# Patient Record
Sex: Female | Born: 1960
Health system: Southern US, Community
[De-identification: ages and names within clinical notes are randomized; demographics above are authoritative.]

## PROBLEM LIST (undated history)

## (undated) DIAGNOSIS — F419 Anxiety disorder, unspecified: Secondary | ICD-10-CM

## (undated) DIAGNOSIS — F329 Major depressive disorder, single episode, unspecified: Secondary | ICD-10-CM

## (undated) DIAGNOSIS — I2510 Atherosclerotic heart disease of native coronary artery without angina pectoris: Secondary | ICD-10-CM

## (undated) DIAGNOSIS — F32A Depression, unspecified: Secondary | ICD-10-CM

## (undated) DIAGNOSIS — E785 Hyperlipidemia, unspecified: Secondary | ICD-10-CM

## (undated) DIAGNOSIS — T7840XA Allergy, unspecified, initial encounter: Secondary | ICD-10-CM

## (undated) DIAGNOSIS — E039 Hypothyroidism, unspecified: Secondary | ICD-10-CM

## (undated) DIAGNOSIS — E079 Disorder of thyroid, unspecified: Secondary | ICD-10-CM

## (undated) DIAGNOSIS — K219 Gastro-esophageal reflux disease without esophagitis: Secondary | ICD-10-CM

## (undated) HISTORY — DX: Allergy, unspecified, initial encounter: T78.40XA

## (undated) HISTORY — DX: Hyperlipidemia, unspecified: E78.5

## (undated) HISTORY — DX: Atherosclerotic heart disease of native coronary artery without angina pectoris: I25.10

## (undated) HISTORY — DX: Major depressive disorder, single episode, unspecified: F32.9

## (undated) HISTORY — DX: Anxiety disorder, unspecified: F41.9

## (undated) HISTORY — DX: Depression, unspecified: F32.A

## (undated) HISTORY — DX: Gastro-esophageal reflux disease without esophagitis: K21.9

## (undated) HISTORY — PX: TONSILLECTOMY: SUR1361

---

## 1998-07-20 ENCOUNTER — Other Ambulatory Visit: Admission: RE | Admit: 1998-07-20 | Discharge: 1998-07-20 | Payer: Self-pay | Admitting: Obstetrics & Gynecology

## 1999-09-06 ENCOUNTER — Other Ambulatory Visit: Admission: RE | Admit: 1999-09-06 | Discharge: 1999-09-06 | Payer: Self-pay | Admitting: Obstetrics & Gynecology

## 2000-10-07 ENCOUNTER — Other Ambulatory Visit: Admission: RE | Admit: 2000-10-07 | Discharge: 2000-10-07 | Payer: Self-pay | Admitting: Obstetrics & Gynecology

## 2001-10-07 ENCOUNTER — Encounter: Admission: RE | Admit: 2001-10-07 | Discharge: 2002-01-05 | Payer: Self-pay | Admitting: Family Medicine

## 2001-10-08 ENCOUNTER — Other Ambulatory Visit: Admission: RE | Admit: 2001-10-08 | Discharge: 2001-10-08 | Payer: Self-pay | Admitting: Obstetrics & Gynecology

## 2003-02-06 ENCOUNTER — Other Ambulatory Visit: Admission: RE | Admit: 2003-02-06 | Discharge: 2003-02-06 | Payer: Self-pay | Admitting: Obstetrics & Gynecology

## 2003-09-22 ENCOUNTER — Ambulatory Visit (HOSPITAL_COMMUNITY): Admission: RE | Admit: 2003-09-22 | Discharge: 2003-09-22 | Payer: Self-pay | Admitting: Obstetrics & Gynecology

## 2004-03-14 ENCOUNTER — Other Ambulatory Visit: Admission: RE | Admit: 2004-03-14 | Discharge: 2004-03-14 | Payer: Self-pay | Admitting: Obstetrics & Gynecology

## 2004-06-10 ENCOUNTER — Observation Stay (HOSPITAL_COMMUNITY): Admission: EM | Admit: 2004-06-10 | Discharge: 2004-06-12 | Payer: Self-pay | Admitting: *Deleted

## 2005-01-03 ENCOUNTER — Encounter: Admission: RE | Admit: 2005-01-03 | Discharge: 2005-01-03 | Payer: Self-pay | Admitting: Family Medicine

## 2005-03-18 ENCOUNTER — Other Ambulatory Visit: Admission: RE | Admit: 2005-03-18 | Discharge: 2005-03-18 | Payer: Self-pay | Admitting: Obstetrics and Gynecology

## 2005-03-29 ENCOUNTER — Encounter: Admission: RE | Admit: 2005-03-29 | Discharge: 2005-03-29 | Payer: Self-pay | Admitting: Family Medicine

## 2009-02-27 ENCOUNTER — Encounter: Payer: Self-pay | Admitting: Sports Medicine

## 2009-03-15 ENCOUNTER — Encounter: Payer: Self-pay | Admitting: Sports Medicine

## 2009-03-15 ENCOUNTER — Ambulatory Visit: Payer: Self-pay

## 2009-03-15 DIAGNOSIS — M216X9 Other acquired deformities of unspecified foot: Secondary | ICD-10-CM | POA: Insufficient documentation

## 2009-03-15 DIAGNOSIS — E039 Hypothyroidism, unspecified: Secondary | ICD-10-CM | POA: Insufficient documentation

## 2009-03-15 DIAGNOSIS — M722 Plantar fascial fibromatosis: Secondary | ICD-10-CM | POA: Insufficient documentation

## 2009-06-16 ENCOUNTER — Emergency Department (HOSPITAL_COMMUNITY): Admission: EM | Admit: 2009-06-16 | Discharge: 2009-06-16 | Payer: Self-pay | Admitting: Emergency Medicine

## 2010-06-27 ENCOUNTER — Encounter: Payer: Self-pay | Admitting: *Deleted

## 2010-07-08 LAB — DIFFERENTIAL
Basophils Absolute: 0.1 10*3/uL (ref 0.0–0.1)
Basophils Relative: 1 % (ref 0–1)
Eosinophils Absolute: 0 10*3/uL (ref 0.0–0.7)
Eosinophils Relative: 1 % (ref 0–5)
Lymphocytes Relative: 19 % (ref 12–46)
Lymphs Abs: 1.6 10*3/uL (ref 0.7–4.0)
Monocytes Absolute: 0.6 10*3/uL (ref 0.1–1.0)
Monocytes Relative: 7 % (ref 3–12)
Neutro Abs: 6.2 10*3/uL (ref 1.7–7.7)
Neutrophils Relative %: 73 % (ref 43–77)

## 2010-07-08 LAB — POCT CARDIAC MARKERS
CKMB, poc: <1 ng/mL — ABNORMAL LOW (ref 1.0–8.0)
CKMB, poc: <1 ng/mL — ABNORMAL LOW (ref 1.0–8.0)
Myoglobin, poc: 31 ng/mL (ref 12–200)
Myoglobin, poc: 33.4 ng/mL (ref 12–200)
Troponin i, poc: <0.05 ng/mL (ref 0.00–0.09)
Troponin i, poc: <0.05 ng/mL (ref 0.00–0.09)

## 2010-07-08 LAB — BASIC METABOLIC PANEL
BUN: 9 mg/dL (ref 6–23)
CO2: 23 mEq/L (ref 19–32)
Calcium: 9.1 mg/dL (ref 8.4–10.5)
Chloride: 107 mEq/L (ref 96–112)
Creatinine, Ser: 0.82 mg/dL (ref 0.4–1.2)
GFR calc Af Amer: >60 mL/min (ref >60)
GFR calc non Af Amer: >60 mL/min (ref >60)
Glucose, Bld: 115 mg/dL — ABNORMAL HIGH (ref 70–99)
Potassium: 3.8 mEq/L (ref 3.5–5.1)
Sodium: 139 mEq/L (ref 135–145)

## 2010-07-08 LAB — D-DIMER, QUANTITATIVE: D-Dimer, Quant: 0.32 ug/mL-FEU (ref 0.00–0.48)

## 2010-07-08 LAB — CBC
HCT: 42.4 % (ref 36.0–46.0)
Hemoglobin: 13.6 g/dL (ref 12.0–15.0)
MCHC: 32 g/dL (ref 30.0–36.0)
MCV: 92.3 fL (ref 78.0–100.0)
Platelets: 284 10*3/uL (ref 150–400)
RBC: 4.59 MIL/uL (ref 3.87–5.11)
RDW: 12.8 % (ref 11.5–15.5)
WBC: 8.4 10*3/uL (ref 4.0–10.5)

## 2010-07-08 LAB — TSH: TSH: 6.35 u[IU]/mL — ABNORMAL HIGH (ref 0.350–4.500)

## 2010-08-30 NOTE — H&P (Signed)
NAME:  Samantha [name], Samantha [name]                  ACCOUNT NO.:  [id number]   MEDICAL RECORD NO.:  [id number]          PATIENT TYPE:  INP   LOCATION:  1843                         FACILITY:  [facility]   PHYSICIAN:  Corinna L. Lendell Caprice, MDDATE OF BIRTH:  [birth year]   DATE OF ADMISSION:  06/10/2004  DATE OF DISCHARGE:                                HISTORY & PHYSICAL   CHIEF COMPLAINT:  Chest pain.   HISTORY OF PRESENT ILLNESS:  Samantha [name] is a pleasant 50 year old white female  who presents to the emergency room with a history of chest pressure  radiating to her left scapula and left arm; this occurred several hours ago  and was relieved with nitroglycerin sublingually.  She continues to have a  sore arm and back, but the chest pressure is gone.  She had some belching  afterward.  This was not exertional.  She is getting over the flu, but her  cough and other symptoms have resolved.  She has never had a stress test, no  cardiac risk factors.   PAST MEDICAL HISTORY:  Hypothyroidism.   MEDICATIONS:  1.  Levoxyl 50 mcg a day.  2.  Yasmin daily.   SOCIAL HISTORY:  The patient does not smoke, drink or use drugs.  She is  here with her husband.   FAMILY HISTORY:  Family history is negative for early coronary artery  disease.   REVIEW OF SYSTEMS:  Review of systems as above, otherwise negative.   PHYSICAL EXAMINATION:  VITAL SIGNS:  Her temperature is 98.  Blood pressure  initially was 82/57; currently, it is 97/58.  Pulse 66.  Respiratory rate  20.  Oxygen saturation 99% on room air.  GENERAL:  In general, the patient is well-nourished, well-developed in no  acute distress.  HEENT:  Normocephalic, atraumatic.  Pupils equal, round and reactive to  light.  Sclerae anicteric.  Mucous membranes moist.  NECK:  Neck is supple.  No lymphadenopathy.  LUNGS:  Lungs are clear to auscultation bilaterally without wheezes, rhonchi  or rales.  CARDIOVASCULAR:  Regular rate and rhythm without murmurs, gallops or  rubs.  She does have slight chest wall tenderness to the left sternal area, but  this feels different than her previous pressure.  ABDOMEN:  Abdomen is soft, nontender and non-distended.  GU AND RECTAL:  Deferred.  EXTREMITIES:  No clubbing, cyanosis, or edema.  No calf tenderness.  Homans  sign negative.  SKIN:  No rash.   LABORATORIES:  Hemoglobin and hematocrit normal.  Basic metabolic panel  normal.  CPK, MB and troponin normal.   EKG shows normal sinus rhythm.   Chest x-ray negative.   ASSESSMENT AND PLAN:  1.  Chest pain.  The patient has already received aspirin and nitroglycerin.      Given her hypotension, I will not give any more nitroglycerin; she was      apparently asymptomatic with this blood pressure.  However, I will      continue aspirin, check serial cardiac enzymes, keep patient on      telemetry and we will need to consult  cardiology in the morning.  2.  Asymptomatic (resolved) hypotension.  3.  Hypothyroidism.      CLS/MEDQ  D:  06/11/2004  T:  06/11/2004  Job:  [id number]   cc:   Donia Guiles, M.D.  [address] [place]  [place] [address]  Fax: [phone]   W. Varney Baas, M.D.  Fax: [phone]

## 2012-01-06 ENCOUNTER — Ambulatory Visit (INDEPENDENT_AMBULATORY_CARE_PROVIDER_SITE_OTHER): Payer: Self-pay | Admitting: Sports Medicine

## 2012-01-06 VITALS — BP 120/78 | Ht 67.0 in | Wt 170.0 lb

## 2012-01-06 DIAGNOSIS — M216X9 Other acquired deformities of unspecified foot: Secondary | ICD-10-CM

## 2012-01-06 DIAGNOSIS — M7660 Achilles tendinitis, unspecified leg: Secondary | ICD-10-CM

## 2012-01-06 DIAGNOSIS — M722 Plantar fascial fibromatosis: Secondary | ICD-10-CM

## 2012-01-06 MED ORDER — NITROGLYCERIN 0.2 MG/HR TD PT24: MEDICATED_PATCH | TRANSDERMAL | Status: DC

## 2012-01-06 NOTE — Assessment & Plan Note (Signed)
The insertion of the right plantar fascia is now normal to measurement on ultrasound  She needs better arch support in shoes in which she walks and exercises

## 2012-01-06 NOTE — Assessment & Plan Note (Signed)
We gave her a standard set of rehabilitation exercises Icing at the end of the day  Nitroglycerin to use one quarter of a patch daily  Repeat her ultrasound in approximate 4-6 weeks  Achilles and plantar fascia problems she will need custom orthotics

## 2012-01-06 NOTE — Progress Notes (Signed)
  Subjective:    Patient ID: Samantha [name], female    DOB: [birth year], 51 y.o.   MRN: [id number]  HPI  Pt presents to clinic for rt achilles pain. Nodule present on rt- x 2 yrs. She has been having increased pain for the last 2 months.  We saw her in 2010 for plantar fasciitis Is improved but her pain has never totally gone away  Hx of plantar fasciitis that improved, but never completely resolved.   Still has pain in arch and towards forefoot Hx of hypothyroidism last TSH 5.34 Takes atenolol for probable PAT.      Review of Systems     Objective:   Physical Exam  4cm arch in sitting position bilat cavus feet Good great toe motion bilat  Nodule on rt least tendon that is 2 cm long and 1 cm wide- mildly tender to squeeze  Normal ankle motion bilat Mild TTP insertion of bilat PF   Ultrasound  Nodule on the right Achilles tendon measures 0.83  At the insertion of the tendon the Achilles measures 0.40  Longitudinal scan shows some mild hypoechoic change within the nodule and increased Doppler activity with neo- vessels  Transverse scan  shows some mild splits within the tendon in the area of the nodule  Left Achilles tendon appears normal         Assessment & Plan:

## 2012-01-06 NOTE — Patient Instructions (Addendum)
Please do suggested exercises daily  Schedule an appointment at your convenience for custom orthotics  Please follow up for another ultrasound on your achilles in 4-6 weeks  Please start 1/4 nitroglycerin patch on right achilles tendon daily  Nitroglycerin Protocol   Apply 1/4 nitroglycerin patch to affected area daily.  Change position of patch within the affected area every 24 hours.  You may experience a headache during the first 1-2 weeks of using the patch, these should subside.  If you experience headaches after beginning nitroglycerin patch treatment, you may take your preferred over the counter pain reliever.  Another side effect of the nitroglycerin patch is skin irritation or rash related to patch adhesive.  Please notify our office if you develop more severe headaches or rash, and stop the patch.  Tendon healing with nitroglycerin patch may require 12 to 24 weeks depending on the extent of injury.  Men should not use if taking Viagra, Cialis, or Levitra.   Do not use if you have migraines or rosacea.   Thank you for seeing [place] today!

## 2012-01-06 NOTE — Assessment & Plan Note (Signed)
Plan a custom orthotic to help accommodate her significant degree of arch strain

## 2012-02-10 ENCOUNTER — Ambulatory Visit: Payer: Self-pay | Admitting: Sports Medicine

## 2012-03-04 ENCOUNTER — Ambulatory Visit (INDEPENDENT_AMBULATORY_CARE_PROVIDER_SITE_OTHER): Payer: Self-pay | Admitting: Sports Medicine

## 2012-03-04 VITALS — BP 128/81 | HR 71 | Ht 67.0 in | Wt 170.0 lb

## 2012-03-04 DIAGNOSIS — M216X9 Other acquired deformities of unspecified foot: Secondary | ICD-10-CM

## 2012-03-04 DIAGNOSIS — M7660 Achilles tendinitis, unspecified leg: Secondary | ICD-10-CM

## 2012-03-04 NOTE — Patient Instructions (Addendum)
Thank you for coming in today. Try the scaphoid pads.  Continue the exercises.  Try to increase the nitroglycerine patch to 1/2 patch.  Come back in 6 weeks or so.

## 2012-03-04 NOTE — Assessment & Plan Note (Signed)
Achilles tendon nodule improved with nitroglycerin protocol and is centric exercises.  Plan: Increase patch to one half of a patch.  Continue eccentric exercises Followup in 6 weeks

## 2012-03-04 NOTE — Progress Notes (Signed)
Samantha [name] is a 51 y.o. female who presents to [facility] today for following up right Achilles tendon nodule.  Patient was diagnosed with an Achilles tendon nodule in September that measured 0.83 cm on ultrasound. She was placed on a nitroglycerin patch protocol as well as eccentric calf exercises.  She notes improvement from 55-60%.  She feels well without any significant pain fevers chills radiating pain weakness or numbness,.  Her current pain is 4/10 worse with activity better with rest.     PMH reviewed.  History  Substance Use Topics  . Smoking status: Not on file  . Smokeless tobacco: Not on file  . Alcohol Use: Not on file   ROS as above otherwise neg   Exam:  BP 128/81  Pulse 71  Ht 5\' 7"  (1.702 m)  Wt 170 lb (77.111 kg)  BMI 26.63 kg/m2 Gen: Well NAD MSK: Right Achilles: Palpable nodule about 4 cm proximal to insertion.  Mildly tender to palpation.  Normal foot motion otherwise strength is intact plantar flexion.  Cavus appearing foot 4cm arch in sitting position  Good great toe motion bilat  Musculoskeletal ultrasound of the Achilles and plantar fascia of the right side: Achilles tendon normal insertion without areas of hypoechoic change or Doppler activity. Seen longitudinal and transverse Achilles tendon nodule at about 4 cm proximal to insertion maximal thickness 0.7 without hypoechoic change with minimal Doppler activity.  Plantar fascia mildly thickened at 0.5 otherwise normal.

## 2012-03-04 NOTE — Assessment & Plan Note (Signed)
Fitted patient with scaphoid pads bilaterally

## 2012-04-20 ENCOUNTER — Ambulatory Visit (INDEPENDENT_AMBULATORY_CARE_PROVIDER_SITE_OTHER): Payer: Self-pay | Admitting: Sports Medicine

## 2012-04-20 VITALS — BP 112/77 | Ht 67.0 in | Wt 170.0 lb

## 2012-04-20 DIAGNOSIS — M7660 Achilles tendinitis, unspecified leg: Secondary | ICD-10-CM

## 2012-04-20 NOTE — Patient Instructions (Addendum)
Continue doing heel raises - try doing the exercise on 1 foot at a time  Continue 1/4 nitroglycerin patch daily  Try heel lifts in your shoes   Please follow up in 8-10 weeks  Thank you for seeing [place] today!

## 2012-04-20 NOTE — Assessment & Plan Note (Signed)
This significant nodule probably take about a year to resolve We'll need to increase the intensity of her exercises since she is not having much pain Continue nitroglycerin at a quarter of a patch and we will probably start weaning then at 6 months  Add a heel lift to the shoes that she wears run regularly  Recheck by me in 8-10 weeks

## 2012-04-20 NOTE — Progress Notes (Signed)
Patient ID: Samantha [name], female   DOB: Apr 05, 1961, 52 y.o.   MRN: [id number]  Almost no pain over AT now Has used NTG for 14 weeks Nodule not very tender to rub anymore On HEP and still not able to do on 1 leg Nodule was 15% smaller last visit. She is more active and walking without much pain  Physical examination  There is a nodule noted 4-6 cm above the right Achilles tendon insertion into the calcaneus This is now freely mobile This is no longer tender to squeeze The insertion of the Achilles tendon is nontender The calf is nontender  MSK ultrasound Compared to last visit the nodule is about the same with at 0.75 The tendon fibers look good On transverse scan there is seen a small split that involves about 15% of the tendon Neo-l vessels in this area

## 2012-06-29 ENCOUNTER — Ambulatory Visit: Payer: Self-pay | Admitting: Sports Medicine

## 2012-07-07 ENCOUNTER — Ambulatory Visit: Payer: Self-pay

## 2013-03-09 ENCOUNTER — Encounter (HOSPITAL_COMMUNITY): Payer: Self-pay | Admitting: Emergency Medicine

## 2013-03-09 ENCOUNTER — Emergency Department (HOSPITAL_COMMUNITY)
Admission: EM | Admit: 2013-03-09 | Discharge: 2013-03-10 | Disposition: A | Discharge status: Home / Self Care | Payer: Self-pay | Attending: Emergency Medicine | Admitting: Emergency Medicine

## 2013-03-09 DIAGNOSIS — E079 Disorder of thyroid, unspecified: Secondary | ICD-10-CM | POA: Insufficient documentation

## 2013-03-09 DIAGNOSIS — K30 Functional dyspepsia: Secondary | ICD-10-CM

## 2013-03-09 DIAGNOSIS — Z79899 Other long term (current) drug therapy: Secondary | ICD-10-CM | POA: Insufficient documentation

## 2013-03-09 DIAGNOSIS — R6884 Jaw pain: Secondary | ICD-10-CM | POA: Insufficient documentation

## 2013-03-09 DIAGNOSIS — R002 Palpitations: Secondary | ICD-10-CM | POA: Insufficient documentation

## 2013-03-09 DIAGNOSIS — K3189 Other diseases of stomach and duodenum: Secondary | ICD-10-CM | POA: Insufficient documentation

## 2013-03-09 DIAGNOSIS — R109 Unspecified abdominal pain: Secondary | ICD-10-CM | POA: Insufficient documentation

## 2013-03-09 HISTORY — DX: Disorder of thyroid, unspecified: E07.9

## 2013-03-09 LAB — CBC WITH DIFFERENTIAL/PLATELET
Basophils Absolute: 0 10*3/uL (ref 0.0–0.1)
Basophils Relative: 0 % (ref 0–1)
Eosinophils Absolute: 0 10*3/uL (ref 0.0–0.7)
Eosinophils Relative: 0 % (ref 0–5)
HCT: 42.7 % (ref 36.0–46.0)
Hemoglobin: 14.4 g/dL (ref 12.0–15.0)
Lymphocytes Relative: 24 % (ref 12–46)
Lymphs Abs: 2.5 10*3/uL (ref 0.7–4.0)
MCH: 31.2 pg (ref 26.0–34.0)
MCHC: 33.7 g/dL (ref 30.0–36.0)
MCV: 92.4 fL (ref 78.0–100.0)
Monocytes Absolute: 0.7 10*3/uL (ref 0.1–1.0)
Monocytes Relative: 7 % (ref 3–12)
Neutro Abs: 7.4 10*3/uL (ref 1.7–7.7)
Neutrophils Relative %: 69 % (ref 43–77)
Platelets: 310 10*3/uL (ref 150–400)
RBC: 4.62 MIL/uL (ref 3.87–5.11)
RDW: 12.6 % (ref 11.5–15.5)
WBC: 10.7 10*3/uL — ABNORMAL HIGH (ref 4.0–10.5)

## 2013-03-09 MED ORDER — ASPIRIN 325 MG PO TABS
325.0000 mg | ORAL_TABLET | Freq: Once | ORAL | Status: AC
  Administered 2013-03-10: 325 mg via ORAL
  Filled 2013-03-09: qty 1

## 2013-03-09 NOTE — ED Notes (Signed)
Pt told xray tech that she wants to wait to get CXR until after blood work comes back to ensure that she actually needs it. Will notify MD.

## 2013-03-09 NOTE — ED Notes (Signed)
Pt. reports palpitations onset today , seen at [facility] walk-in clinic , sent here for further evaluation , denies chest pain or SOB , " it feels like I'm going to burp " . Pt. refused EKG at triage , pt. stated EKG was just done at the clinic .

## 2013-03-09 NOTE — ED Notes (Addendum)
Pt reports she was recently taken off of PO birth control pill and states that this threw off her levothyroxine medication and has resulted in her heart racing. Pt also reports has been more stressed than normal lately. Pt refusing to be connected to monitor/cardiac monitor.

## 2013-03-09 NOTE — ED Provider Notes (Signed)
CSN: [id number]     Arrival date & time 03/09/13  1941 History   First MD Initiated Contact with Patient 03/09/13 2305     Chief Complaint  Patient presents with  . Palpitations   (Consider location/radiation/quality/duration/timing/severity/associated sxs/prior Treatment) HPI Comments: Pt is a 52 y.o. female with Pmhx as above who presents with palpitations and was sent from [facility] walk-in clinic for troponin. Pt states she has had palpitations for years, recently has been having her synthroid decreased.  They occur sporatically, daily. For past 4-5 days she has been having episodes of indigestion described as epigastric discomfort relieved by belching.  They occuring after eating.  2 days ago she had L jaw pain after yard work.  Today she had "indigestion" and jaw pain while bent over her sink.   Past Medical History  Diagnosis Date  . Thyroid disease    History reviewed. No pertinent past surgical history. No family history on file. History  Substance Use Topics  . Smoking status: Never Smoker   . Smokeless tobacco: Not on file  . Alcohol Use: No   OB History   Grav Para Term Preterm Abortions TAB SAB Ect Mult Living                 Review of Systems  Constitutional: Negative for fever, chills, diaphoresis, activity change, appetite change and fatigue.  HENT: Negative for congestion, facial swelling, rhinorrhea and sore throat.   Eyes: Negative for photophobia and discharge.  Respiratory: Negative for cough, chest tightness and shortness of breath.   Cardiovascular: Negative for chest pain, palpitations and leg swelling.  Gastrointestinal: Positive for abdominal pain. Negative for nausea, vomiting and diarrhea.  Endocrine: Negative for polydipsia and polyuria.  Genitourinary: Negative for dysuria, frequency, difficulty urinating and pelvic pain.  Musculoskeletal: Negative for arthralgias, back pain, neck pain and neck stiffness.  Skin: Negative for color change and wound.   Allergic/Immunologic: Negative for immunocompromised state.  Neurological: Negative for facial asymmetry, weakness, numbness and headaches.  Hematological: Does not bruise/bleed easily.  Psychiatric/Behavioral: Negative for confusion and agitation.    Allergies  Sulfonamide derivatives  Home Medications   Current Outpatient Rx  Name  Route  Sig  Dispense  Refill  . atenolol (TENORMIN) 25 MG tablet   Oral   Take 12.5 mg by mouth daily.         Marland Kitchen bismuth subsalicylate (PEPTO BISMOL) 262 MG/15ML suspension   Oral   Take 60 mLs by mouth every 6 (six) hours as needed for indigestion.         Marland Kitchen ibuprofen (ADVIL,MOTRIN) 200 MG tablet   Oral   Take 400 mg by mouth every 6 (six) hours as needed for mild pain or moderate pain.         . Multiple Vitamin (MULTIVITAMIN WITH MINERALS) TABS tablet   Oral   Take 1 tablet by mouth daily.         . ranitidine (ZANTAC) 150 MG tablet   Oral   Take 150 mg by mouth daily as needed for heartburn.         . levothyroxine (SYNTHROID) 100 MCG tablet   Oral   Take 100 mcg by mouth daily before breakfast.           BP 153/88  Pulse 96  Temp(Src) 98.1 F (36.7 C) (Oral)  Resp 16  Wt 178 lb 9.6 oz (81.012 kg)  SpO2 100% Physical Exam  Constitutional: She is oriented to person, place, and time.  She appears well-developed and well-nourished. No distress.  HENT:  Head: Normocephalic and atraumatic.  Mouth/Throat: No oropharyngeal exudate.  Eyes: Pupils are equal, round, and reactive to light.  Neck: Normal range of motion. Neck supple.  Cardiovascular: Normal rate, regular rhythm and normal heart sounds.  Exam reveals no gallop and no friction rub.   No murmur heard. Pulmonary/Chest: Effort normal and breath sounds normal. No respiratory distress. She has no wheezes. She has no rales.  Abdominal: Soft. Bowel sounds are normal. She exhibits no distension and no mass. There is no tenderness. There is no rebound and no guarding.   Musculoskeletal: Normal range of motion. She exhibits no edema and no tenderness.  Neurological: She is alert and oriented to person, place, and time.  Skin: Skin is warm and dry.  Psychiatric: She has a normal mood and affect.    ED Course  Procedures (including critical care time) Labs Review Labs Reviewed  CBC WITH DIFFERENTIAL - Abnormal; Notable for the following:    WBC 10.7 (*)    All other components within normal limits  BASIC METABOLIC PANEL - Abnormal; Notable for the following:    Glucose, Bld 113 (*)    All other components within normal limits  TROPONIN I  PRO B NATRIURETIC PEPTIDE   Imaging Review Dg Chest 2 View  03/10/2013   CLINICAL DATA:  Palpitations tonight.  EXAM: CHEST  2 VIEW  COMPARISON:  06/16/2009  FINDINGS: The heart size and mediastinal contours are within normal limits. Both lungs are clear. The visualized skeletal structures are unremarkable.  IMPRESSION: No active cardiopulmonary disease.   Electronically Signed   By: Burman Nieves M.D.   On: 03/10/2013 01:33    EKG Interpretation   None      Date: 03/10/2013  Rate: 98  Rhythm: normal sinus rhythm  QRS Axis: normal  Intervals: normal  ST/T Wave abnormalities: normal  Conduction Disutrbances:none  Narrative Interpretation:   Old EKG Reviewed: unchanged    MDM   1. Palpitations   2. Indigestion    Pt is a 52 y.o. female with Pmhx as above who presents with palpitations and was sent from [facility] walk-in clinic for troponin. Pt states she has had palpitations for years, recently has been having her synthroid decreased.  For past 4-5 days she has been having episodes of indigestion described as epigastric discomfort relieved by belching.  2 days ago she had L jaw pain after yard work.  Today she had "indigestion" and jaw pain while bent over her sink. On PE, VSS, pt in NAD, EKG unremarkable as above. She is currenlt asymptomatic. Trop, BNP not elevated, CXR unremarkable. CBC, BMP also  unremarkable.  I doubt an acute cardiac event. Symptoms would be atypical for ACS, but have spoken to cardiology who will schedule an outpt stress test tomorrow or Monday.  Return precautions given for new or worsening symptoms including chest pain, trouble breathing, leg swelling.          Shanna Cisco, MD 03/10/13 2128

## 2013-03-10 ENCOUNTER — Emergency Department (HOSPITAL_COMMUNITY): Payer: Self-pay

## 2013-03-10 LAB — BASIC METABOLIC PANEL
BUN: 11 mg/dL (ref 6–23)
CO2: 26 mEq/L (ref 19–32)
Calcium: 9.6 mg/dL (ref 8.4–10.5)
Chloride: 102 mEq/L (ref 96–112)
Creatinine, Ser: 0.62 mg/dL (ref 0.50–1.10)
GFR calc Af Amer: >90 mL/min (ref >90)
GFR calc non Af Amer: >90 mL/min (ref >90)
Glucose, Bld: 113 mg/dL — ABNORMAL HIGH (ref 70–99)
Potassium: 3.5 mEq/L (ref 3.5–5.1)
Sodium: 140 mEq/L (ref 135–145)

## 2013-03-10 LAB — PRO B NATRIURETIC PEPTIDE: Pro B Natriuretic peptide (BNP): 69.7 pg/mL (ref 0–125)

## 2013-03-10 LAB — TROPONIN I: Troponin I: <0.3 ng/mL (ref <0.30)

## 2013-03-10 NOTE — Discharge Instructions (Signed)
Chest Pain (Nonspecific) °It is often hard to give a specific diagnosis for the cause of chest pain. There is always a chance that your pain could be related to something serious, such as a heart attack or a blood clot in the lungs. You need to follow up with your caregiver for further evaluation. °CAUSES  °· Heartburn. °· Pneumonia or bronchitis. °· Anxiety or stress. °· Inflammation around your heart (pericarditis) or lung (pleuritis or pleurisy). °· A blood clot in the lung. °· A collapsed lung (pneumothorax). It can develop suddenly on its own (spontaneous pneumothorax) or from injury (trauma) to the chest. °· Shingles infection (herpes zoster virus). °The chest wall is composed of bones, muscles, and cartilage. Any of these can be the source of the pain. °· The bones can be bruised by injury. °· The muscles or cartilage can be strained by coughing or overwork. °· The cartilage can be affected by inflammation and become sore (costochondritis). °DIAGNOSIS  °Lab tests or other studies, such as X-rays, electrocardiography, stress testing, or cardiac imaging, may be needed to find the cause of your pain.  °TREATMENT  °· Treatment depends on what may be causing your chest pain. Treatment may include: °· Acid blockers for heartburn. °· Anti-inflammatory medicine. °· Pain medicine for inflammatory conditions. °· Antibiotics if an infection is present. °· You may be advised to change lifestyle habits. This includes stopping smoking and avoiding alcohol, caffeine, and chocolate. °· You may be advised to keep your head raised (elevated) when sleeping. This reduces the chance of acid going backward from your stomach into your esophagus. °· Most of the time, nonspecific chest pain will improve within 2 to 3 days with rest and mild pain medicine. °HOME CARE INSTRUCTIONS  °· If antibiotics were prescribed, take your antibiotics as directed. Finish them even if you start to feel better. °· For the next few days, avoid physical  activities that bring on chest pain. Continue physical activities as directed. °· Do not smoke. °· Avoid drinking alcohol. °· Only take over-the-counter or prescription medicine for pain, discomfort, or fever as directed by your caregiver. °· Follow your caregiver's suggestions for further testing if your chest pain does not go away. °· Keep any follow-up appointments you made. If you do not go to an appointment, you could develop lasting (chronic) problems with pain. If there is any problem keeping an appointment, you must call to reschedule. °SEEK MEDICAL CARE IF:  °· You think you are having problems from the medicine you are taking. Read your medicine instructions carefully. °· Your chest pain does not go away, even after treatment. °· You develop a rash with blisters on your chest. °SEEK IMMEDIATE MEDICAL CARE IF:  °· You have increased chest pain or pain that spreads to your arm, neck, jaw, back, or abdomen. °· You develop shortness of breath, an increasing cough, or you are coughing up blood. °· You have severe back or abdominal pain, feel nauseous, or vomit. °· You develop severe weakness, fainting, or chills. °· You have a fever. °THIS IS AN EMERGENCY. Do not wait to see if the pain will go away. Get medical help at once. Call your local emergency services (911 in [place] Do not drive yourself to the hospital. °MAKE SURE YOU:  °· Understand these instructions. °· Will watch your condition. °· Will get help right away if you are not doing well or get worse. °Document Released: 01/08/2005 Document Revised: 06/23/2011 Document Reviewed: 11/04/2007 °ExitCare® Patient Information ©2014 [employer]   LL[employer]. ° °Palpitations  °A palpitation is the feeling that your heartbeat is irregular or is faster than normal. It may feel like your heart is fluttering or skipping a beat. Palpitations are usually not a serious problem. However, in some cases, you may need further medical evaluation. °CAUSES  °Palpitations can be caused  by: °· Smoking. °· Caffeine or other stimulants, such as diet pills or energy drinks. °· Alcohol. °· Stress and anxiety. °· Strenuous physical activity. °· Fatigue. °· Certain medicines. °· Heart disease, especially if you have a history of arrhythmias. This includes atrial fibrillation, atrial flutter, or supraventricular tachycardia. °· An improperly working pacemaker or defibrillator. °DIAGNOSIS  °To find the cause of your palpitations, your caregiver will take your history and perform a physical exam. Tests may also be done, including: °· Electrocardiography (ECG). This test records the heart's electrical activity. °· Cardiac monitoring. This allows your caregiver to monitor your heart rate and rhythm in real time. °· Holter monitor. This is a portable device that records your heartbeat and can help diagnose heart arrhythmias. It allows your caregiver to track your heart activity for several days, if needed. °· Stress tests by exercise or by giving medicine that makes the heart beat faster. °TREATMENT  °Treatment of palpitations depends on the cause of your symptoms and can vary greatly. Most cases of palpitations do not require any treatment other than time, relaxation, and monitoring your symptoms. Other causes, such as atrial fibrillation, atrial flutter, or supraventricular tachycardia, usually require further treatment. °HOME CARE INSTRUCTIONS  °· Avoid: °· Caffeinated coffee, tea, soft drinks, diet pills, and energy drinks. °· Chocolate. °· Alcohol. °· Stop smoking if you smoke. °· Reduce your stress and anxiety. Things that can help you relax include: °· A method that measures bodily functions so you can learn to control them (biofeedback). °· Yoga. °· Meditation. °· Physical activity such as swimming, jogging, or walking. °· Get plenty of rest and sleep. °SEEK MEDICAL CARE IF:  °· You continue to have a fast or irregular heartbeat beyond 24 hours. °· Your palpitations occur more often. °SEEK IMMEDIATE  MEDICAL CARE IF: °· You develop chest pain or shortness of breath. °· You have a severe headache. °· You feel dizzy, or you faint. °MAKE SURE YOU: °· Understand these instructions. °· Will watch your condition. °· Will get help right away if you are not doing well or get worse. °Document Released: 03/28/2000 Document Revised: 07/26/2012 Document Reviewed: 05/30/2011 °ExitCare® Patient Information ©2014 [employer]. ° °

## 2013-03-22 ENCOUNTER — Encounter: Payer: Self-pay | Admitting: Sports Medicine

## 2013-08-05 ENCOUNTER — Ambulatory Visit
Admission: RE | Admit: 2013-08-05 | Discharge: 2013-08-05 | Disposition: A | Discharge status: Home / Self Care | Payer: No Typology Code available for payment source | Source: Ambulatory Visit | Attending: Physician Assistant | Admitting: Physician Assistant

## 2013-08-05 ENCOUNTER — Other Ambulatory Visit: Payer: Self-pay | Admitting: Physician Assistant

## 2013-08-05 DIAGNOSIS — M549 Dorsalgia, unspecified: Secondary | ICD-10-CM

## 2013-08-18 ENCOUNTER — Ambulatory Visit (INDEPENDENT_AMBULATORY_CARE_PROVIDER_SITE_OTHER): Payer: Self-pay | Admitting: Sports Medicine

## 2013-08-18 ENCOUNTER — Encounter: Payer: Self-pay | Admitting: Sports Medicine

## 2013-08-18 VITALS — BP 134/89 | Ht 67.0 in | Wt 169.0 lb

## 2013-08-18 DIAGNOSIS — M5030 Other cervical disc degeneration, unspecified cervical region: Secondary | ICD-10-CM | POA: Insufficient documentation

## 2013-08-18 DIAGNOSIS — M47816 Spondylosis without myelopathy or radiculopathy, lumbar region: Secondary | ICD-10-CM | POA: Insufficient documentation

## 2013-08-18 DIAGNOSIS — M47817 Spondylosis without myelopathy or radiculopathy, lumbosacral region: Secondary | ICD-10-CM

## 2013-08-18 NOTE — Assessment & Plan Note (Signed)
Focus for the cervical spine will be good posture and range of motion exercises  If the neuropathy gets worse I would consider starting her on gabapentin

## 2013-08-18 NOTE — Assessment & Plan Note (Signed)
This is symptomatic but I think can be improved with a series of exercises to focus on abdominal strengthening Lumbar flexion series Avoidance of activities that cause back extension Continue with walking program  Reevaluate in 2 months

## 2013-08-18 NOTE — Progress Notes (Signed)
Patient ID: Samantha [name], female   DOB: Mar 26, 1961, 53 y.o.   MRN: [id number]  Been having some low back pain more over the lumbar area over the past few months She sometimes feels a bit of a click in the low back with walking Because of the pain she had an evaluation at her primary care office X-rays of the cervical, thoracic and lumbar spine show  Degenerative disc disease and some mild arthritis of the cervical spine This appears consistent with the numbness she gets in the fifth finger on both hands She is not getting weakness or significant pain from the radiculopathy in that area  Thoracic area unremarkable  Lumbar area shows some mild spondylolysis / also one area of anterolisthesis within the lumbar spine  She does some stretches for her lumbar area and wants to continue on a walking program  Examination  No acute distress BP 134/89  Ht 5\' 7"  (1.702 m)  Wt 169 lb (76.658 kg)  BMI 26.46 kg/m2  Testing of C5-T1 reveals no weakness She feels some tingling in her fifth fingers bilaterally Neck range of motion is full with exception of left lateral band and posture is good  Lumbar spine shows normal range of motion but she does feel some pain and tightness with lateral bend and with full flexion Some pain with extension that occurs early  Straight leg raise is negative Strength testing to the lower extremities is normal  heel and toe walk is normal  Reflexes are all somewhat diminished at trace to 1+ but she does have hypothyroidism Left triceps re flex negative

## 2013-10-19 ENCOUNTER — Encounter: Payer: Self-pay | Admitting: Sports Medicine

## 2013-10-19 ENCOUNTER — Ambulatory Visit (INDEPENDENT_AMBULATORY_CARE_PROVIDER_SITE_OTHER): Payer: Self-pay | Admitting: Sports Medicine

## 2013-10-19 VITALS — BP 115/78 | HR 78 | Ht 67.0 in | Wt 170.0 lb

## 2013-10-19 DIAGNOSIS — M47816 Spondylosis without myelopathy or radiculopathy, lumbar region: Secondary | ICD-10-CM

## 2013-10-19 DIAGNOSIS — M5030 Other cervical disc degeneration, unspecified cervical region: Secondary | ICD-10-CM

## 2013-10-19 DIAGNOSIS — M47817 Spondylosis without myelopathy or radiculopathy, lumbosacral region: Secondary | ICD-10-CM

## 2013-10-19 NOTE — Assessment & Plan Note (Signed)
We started her on a series of back exercises that it worked very well Today I added a few core exercises and she is very weak on plank and bridge  I would like her to try to get back into a walking program and some preventative exercises for osteoporosis

## 2013-10-19 NOTE — Assessment & Plan Note (Signed)
Her symptoms are improved Continue to work on range of motion and posture

## 2013-10-19 NOTE — Patient Instructions (Signed)
Plan to improve CV conditions and stabilize muscles to lessen risk of bone injury  Osteoporosis prevention 3 hops x 30 secs daily 10 overhead lifts of 3 lbs dumbells - do 3 sets  For back keep up the exercises we started Add a few more abdominal sit ups or crunches  Gradually add some side planks and back bridges  OK to cheat until you get better but the goal is 5 of each at 5 breaths  Try to accumulate at least 150 mins of walking per week or if you a get fit bit aim to build to 10000 steps per day  See me in 3 mos

## 2013-10-19 NOTE — Progress Notes (Signed)
Patient ID: Samantha [name], female   DOB: 1961-03-10, 53 y.o.   MRN: [id number]  Patient returns for followup of the degenerative disc disease in both her cervical spine and lumbar spine She feels she's made a lot of progress with a home exercise program for her low back She has worked on posture and cervical positioning and has had less symptoms with this Currently the numbness she has into both arms is much less frequent  Low back pain has flared on a couple of occasions When she resumes the exercises this seems to resolve  She has not gotten back into her walking program yet feels that she can do this now that the pain is less   Strong family history of osteoporosis and she would like to begin prevention program  Examination NAD BP 115/78  Pulse 78  Ht 5\' 7"  (1.702 m)  Wt 170 lb (77.111 kg)  BMI 26.62 kg/m2  Range of motion of both hips SI joint motion is good Straight leg raise negative Were able do full flexion extension rotation and bending with no problems Strength appears improved  Posture position is improved Neck motion shows full extension flexion rotation and lateral bending without pain

## 2014-01-18 ENCOUNTER — Ambulatory Visit: Payer: Self-pay | Admitting: Sports Medicine

## 2014-01-26 ENCOUNTER — Ambulatory Visit: Payer: Self-pay | Admitting: Sports Medicine

## 2014-01-27 ENCOUNTER — Encounter: Payer: Self-pay | Admitting: Sports Medicine

## 2014-01-27 ENCOUNTER — Other Ambulatory Visit: Payer: Self-pay

## 2014-02-07 ENCOUNTER — Ambulatory Visit: Payer: Self-pay | Admitting: Sports Medicine

## 2014-03-21 ENCOUNTER — Ambulatory Visit (INDEPENDENT_AMBULATORY_CARE_PROVIDER_SITE_OTHER): Payer: Self-pay | Admitting: Sports Medicine

## 2014-03-21 ENCOUNTER — Encounter: Payer: Self-pay | Admitting: Sports Medicine

## 2014-03-21 VITALS — BP 121/78 | HR 74 | Ht 67.0 in | Wt 170.0 lb

## 2014-03-21 DIAGNOSIS — M5412 Radiculopathy, cervical region: Secondary | ICD-10-CM | POA: Insufficient documentation

## 2014-03-21 DIAGNOSIS — M5030 Other cervical disc degeneration, unspecified cervical region: Secondary | ICD-10-CM

## 2014-03-21 MED ORDER — GABAPENTIN 600 MG PO TABS: 300.0000 mg | ORAL_TABLET | Freq: Every day | ORAL | Status: DC

## 2014-03-21 NOTE — Patient Instructions (Signed)
Start with 1/2 tablet of Gabapentin 300 mg at bedtime, increase to 1 tablet will you tolerate medication and getting clinical improvement after 2 weeks  Common side effects: sedation, constipation, and dizziness Any changes in mood stop the medication

## 2014-03-21 NOTE — Assessment & Plan Note (Signed)
The patient's evidence of degenerative joint disease throughout her cervical spine along with radiculopathy particularly at the C8 nerve root from nerve impingement within the spine.  Recommendations: Advised patient restart exercises as these have some clinical benefit in improving muscle tension and pain. Started patient on gabapentin as a nerve modulating agent at 300 mg at bedtime increasing to 600 mg as tolerated. Patient was notified of side effects and recent stop the medication.  Will follow-up with her in 6 weeks to reassess her clinical response this medication

## 2014-03-21 NOTE — Progress Notes (Signed)
  Samantha [name] - 53 y.o. female MRN [id number]  Date of birth: 08-15-1960  SUBJECTIVE:  Including CC & ROS.  Patient is a pleasant 53 year old female who presents today for follow-up cervical spine pain with radiation and radiculopathy and her right arm. Patient reports that when she was doing some exercises of range of motion and strengthening of her neck did have some clinical improvement however she is discontinued these recently because of a death in the family. Majority of her pain she localizes to her right scapula with radiation down her lateral arm elbow and into her fifth digit of the right hand. Describes it as a dull ache with intermittent numbness and tingling. She denies any weakness or change in strength. Previous x-rays from April show degenerative changes. Most of the symptoms are aggravated by certain positions of her neck range of motion.   ROS: Review of systems otherwise negative except for information present in HPI  HISTORY: Past Medical, Surgical, Social, and Family History Reviewed & Updated per EMR. Pertinent Historical Findings include: Depression currently on Zoloft Hypothyroidism, Hormone replacement therapy postmenopausal  DATA REVIEWED: X-rays from April 2015 shows degenerative changes throughout the cervical spine worse at C7-T1 which is consistent with patient's radiculopathy. Along with joint space narrowing at C4-C5, C6-C7, C7-T1.  PHYSICAL EXAM:  VS: BP:121/78 mmHg  HR:74bpm  TEMP: ( )  RESP:   HT:5\' 7"  (170.2 cm)   WT:170 lb (77.111 kg)  BMI:26.7 UPPER BACK EXAM: General: well nourished Skin of UE: warm; dry, no rashes, lesions, ecchymosis or erythema. Vascular: radial pulses 2+ bilaterally Observation: Normal curvature no excessive lordosis or kyphosis or scoliosis.  Shoulders are aligned, tips of scapula are symmetric Palpation: No step off defects throughout the cervical or thoracic spine.  Mild significant paraspinal muscle tenderness. Range of motion:  Normal shoulder range of motion.  Normal range of motion in flexion, extension, rotation of the neck. Special tests: Positive Spurling sign to the right with radiation down into the right hand 5th digit Motor and sensory: Shoulder Abduction (C5) Intact Elbow Flexion (C6) intact Shoulder Extension above head (C7) intact Forearm Pronation - C7/8 intact Wrist Extension (C6) intact Wrist Flexion (C7) intact Fingers Extension/ Flexion (C7, C8) intact Finger Abduction/adduction (T1) intact  ASSESSMENT & PLAN: See problem based charting & AVS for pt instructions.

## 2014-03-23 ENCOUNTER — Encounter: Payer: Self-pay | Admitting: Sports Medicine

## 2014-05-02 ENCOUNTER — Ambulatory Visit: Payer: Self-pay | Admitting: Sports Medicine

## 2014-05-03 ENCOUNTER — Encounter: Payer: Self-pay | Admitting: Sports Medicine

## 2014-05-03 ENCOUNTER — Ambulatory Visit (INDEPENDENT_AMBULATORY_CARE_PROVIDER_SITE_OTHER): Payer: BLUE CROSS/BLUE SHIELD | Admitting: Sports Medicine

## 2014-05-03 VITALS — BP 112/75 | HR 73 | Ht 67.0 in | Wt 170.0 lb

## 2014-05-03 DIAGNOSIS — M7021 Olecranon bursitis, right elbow: Secondary | ICD-10-CM | POA: Insufficient documentation

## 2014-05-03 DIAGNOSIS — M25521 Pain in right elbow: Secondary | ICD-10-CM | POA: Diagnosis not present

## 2014-05-03 MED ORDER — NITROGLYCERIN 0.2 MG/HR TD PT24: MEDICATED_PATCH | TRANSDERMAL | Status: DC

## 2014-05-03 NOTE — Assessment & Plan Note (Signed)
Retro-Olecranon Bursitis on [place] today. Causing irritation of ulnar nerve. Her cervical radiculopathy had improved on gabapentin 300mg  qHS, so I do not think this is coming from her neck. -Body helix compression sleeve fitted and applied -Start NTG protocol -Try to avoid full extension, instigating activities -follow-up in 6-8 weeks or sooner if needed

## 2014-05-03 NOTE — Progress Notes (Signed)
   Subjective:    Patient ID: Samantha [name], female    DOB: 08/09/1960, 54 y.o.   MRN: [id number]  HPI [name] Is a 54 year old right-hand-dominant female who presents with bilateral, though right greater than left elbow pain.  Location of her right elbow pain is primarily posterior medial with some radiation into the fourth and fifth fingers.  She denies any acute injury.  Character is a sharp burning, "electric pain".  She has been taking gabapentin 300 mg nightly, which has helped her neck pain and cervical radiculopathy.  She does not feel that the pain originates from her neck, but rather is localized at her elbow.  She currently works as a Scientist, product/process development, and is frequently vacuuming and wiping objects. She denies any swelling or mechanical locking or catching in the elbow.  She has not tried any other interventions for this.  Symptoms are aggravated with the above household activities.  Past medical history, social history, medications, and allergies were reviewed and are up to date in the chart. Review of Systems 7 point review of systems was performed and was otherwise negative unless noted in the history of present illness.     Objective:   Physical Exam BP 112/75 mmHg  Pulse 73  Ht 5\' 7"  (1.702 m)  Wt 170 lb (77.111 kg)  BMI 26.62 kg/m2 GEN: The patient is well-developed well-nourished female and in no acute distress.  She is awake alert and oriented x3. SKIN: warm and well-perfused, no rash  Neuro: Strength 5/5 globally. Sensation intact throughout. No focal deficits. Vasc: +2 bilateral distal pulses. No edema.  MSK: Examination of bilateral elbows reveals full range of motion with minimal pain. Tenderness to palpation is elicited at the right posteromedial olecranon groove.  Positive Tinel's at the elbow.  No pain with resisted pronation or supination. No pain at the triceps insertion.  No pain at the common flexor or extensor tendons. No swelling or localized bursal swelling.  Limited  musculoskeletal ultrasound: long and short axis views are obtained of the right elbow.  There appears to be an increase in the right posterior olecranon bursa, most visible at 45 of elbow flexion, increased on the right compared to left.  The triceps tendon and its insertion appears normal.  The lateral and medial epicondyles appear normal.  The common flexor and extensor tendons appear normal.    Assessment & Plan:  Please see problem based assessment and plan in the problem list.

## 2014-05-15 ENCOUNTER — Encounter: Payer: Self-pay | Admitting: Sports Medicine

## 2014-05-24 ENCOUNTER — Other Ambulatory Visit: Payer: Self-pay | Admitting: *Deleted

## 2014-05-24 MED ORDER — GABAPENTIN 100 MG PO CAPS: ORAL_CAPSULE | ORAL | Status: DC

## 2014-06-06 ENCOUNTER — Encounter: Payer: Self-pay | Admitting: Sports Medicine

## 2014-06-13 ENCOUNTER — Encounter: Payer: Self-pay | Admitting: Sports Medicine

## 2014-06-13 ENCOUNTER — Ambulatory Visit (INDEPENDENT_AMBULATORY_CARE_PROVIDER_SITE_OTHER): Payer: BLUE CROSS/BLUE SHIELD | Admitting: Sports Medicine

## 2014-06-13 VITALS — BP 126/80 | Ht 67.5 in | Wt 168.0 lb

## 2014-06-13 DIAGNOSIS — M7021 Olecranon bursitis, right elbow: Secondary | ICD-10-CM

## 2014-06-13 DIAGNOSIS — M5030 Other cervical disc degeneration, unspecified cervical region: Secondary | ICD-10-CM

## 2014-06-13 NOTE — Assessment & Plan Note (Signed)
Continued to encourage caution with flexion of neck during work. Maintain good posture. D/c gabapentin due to side effects. Return if sx's worsen.

## 2014-06-13 NOTE — Assessment & Plan Note (Signed)
Improving. Continue nitroglycerin patches for another 4-6 weeks. Return if not improved at that time, as may need additional imaging. Continue body helix sleeve as well.

## 2014-06-13 NOTE — Patient Instructions (Signed)
Continue to limit time with your neck leaning down  Continue the elbow sleeve Continue nitroglycerin for another 4-6 weeks Avoid hyperextension of elbow Okay to stay off the gabapentin for now Let us know if you have persistent tingling or start to have weakness in your hand  Return if not better in 4-6 weeks.

## 2014-06-13 NOTE — Progress Notes (Signed)
   HPI:  Samantha [name] presents for 6 week f/u of her R elbow olecranon bursitis. She has been doing nitroglycerin patches over the medial aspect of her elbow. Has also been doing body helix compression sleeve whenever she is active with her elbow. Has also been avoiding aggravating movements. 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 Pain mostly with movements.  The pain is now more localized to her medial elbow, whereas before it was generalized over the entire olecranon.  She also has had to stop her gabapentin due to unwanted side effects. She was on this for cervical radiculopathy. She noted decreased coordination, increased drowsiness, and itching. She has been completely off it for 3 days now. Is beginning to have some pain return, did have relief while on the medication.   ROS: See HPI  [place]:  Depression currently on Zoloft Hypothyroidism, followed by endocrinology Hormone replacement therapy postmenopausal  PHYSICAL EXAM: BP 126/80 mmHg  Ht 5' 7.5" (1.715 m)  Wt 168 lb (76.204 kg)  BMI 25.91 kg/m2 Gen: NAD, pleasant, cooperative MSK:  -Neck: full ROM with rotation, flexion, extension, lateral flexion. Good alignment/posture. -R elbow: mild TTP over medial epicondyle. No TTP over olecranon itself. Full ROM with flexion, extension, supination, pronation. Full strength with flexion, extension, grip. 2+ radial pulse, sensation intact over fingers + tinels in RT cubital tunnel / not on left  ASSESSMENT/PLAN:  Olecranon bursitis of right elbow Improving. Continue nitroglycerin patches for another 4-6 weeks. Return if not improved at that time, as may need additional imaging. Continue body helix sleeve as well.   Degenerative disc disease, cervical Continued to encourage caution with flexion of neck during work. Maintain good posture. D/c gabapentin due to side effects. Return if sx's worsen.      FOLLOW UP: F/u in 4-6 weeks for  elbow & neck if not improved.  SIGNED: Delorse Limber. Ardelia Mems, MD Family Medicine Resident PGY-3  Edited and agree/  [name], MD [place]

## 2014-06-14 ENCOUNTER — Ambulatory Visit: Payer: BLUE CROSS/BLUE SHIELD | Admitting: Sports Medicine

## 2014-06-15 ENCOUNTER — Encounter: Payer: Self-pay | Admitting: Sports Medicine

## 2014-06-16 ENCOUNTER — Other Ambulatory Visit: Payer: Self-pay | Admitting: *Deleted

## 2014-08-16 ENCOUNTER — Ambulatory Visit: Payer: BLUE CROSS/BLUE SHIELD | Admitting: Sports Medicine

## 2014-08-18 ENCOUNTER — Ambulatory Visit (INDEPENDENT_AMBULATORY_CARE_PROVIDER_SITE_OTHER): Payer: BLUE CROSS/BLUE SHIELD | Admitting: Sports Medicine

## 2014-08-18 ENCOUNTER — Encounter: Payer: Self-pay | Admitting: Sports Medicine

## 2014-08-18 VITALS — BP 116/57 | Ht 67.5 in | Wt 168.0 lb

## 2014-08-18 DIAGNOSIS — M25522 Pain in left elbow: Secondary | ICD-10-CM

## 2014-08-18 DIAGNOSIS — M7021 Olecranon bursitis, right elbow: Secondary | ICD-10-CM | POA: Diagnosis not present

## 2014-08-18 NOTE — Assessment & Plan Note (Signed)
This has essentially resolved  She can complete her nitroglycerin protocol  I think both elbows hurt because of overuse  Continue to use compression sleeves when doing housework and gardening  She can recheck with me in 2-3 months if it doesn't completely resolve

## 2014-08-18 NOTE — Progress Notes (Signed)
Patient ID: Samantha [name], female   DOB: 1960-11-12, 54 y.o.   MRN: [id number]  Patient w RT elbow pain Found with a bursal swelling Thought related to work activity - worse with vacuum ON [employer] - pain with elbow flexion like fixing hair  Her pain is consistent but not severe like at first Now more generalized around the elbow Now she has some the same generalized pain in her left elbow   This is worse with heavier housework and gardening  Examination No acute distress BP 116/57 mmHg  Ht 5' 7.5" (1.715 m)  Wt 168 lb (76.204 kg)  BMI 25.91 kg/m2  Bilaterally patient has full range of motion of the elbows She has good flexion and extension strength without pain Forearm rotation full No significant tenderness to palpation No swelling behind the right elbow today  Ultrasound Olecranon bursal swelling has almost completely resolved on the right and is at least 90% less Triceps tendon intact Medial and lateral epicondyles show normal tendons  Left elbow shows no bursal swelling and normal medial and lateral epicondyles

## 2014-10-28 ENCOUNTER — Ambulatory Visit (INDEPENDENT_AMBULATORY_CARE_PROVIDER_SITE_OTHER): Payer: BLUE CROSS/BLUE SHIELD | Admitting: Family Medicine

## 2014-10-28 VITALS — BP 128/80 | HR 76 | Temp 98.1°F | Resp 16 | Ht 68.0 in | Wt 170.4 lb

## 2014-10-28 DIAGNOSIS — L509 Urticaria, unspecified: Secondary | ICD-10-CM | POA: Diagnosis not present

## 2014-10-28 DIAGNOSIS — K219 Gastro-esophageal reflux disease without esophagitis: Secondary | ICD-10-CM

## 2014-10-28 MED ORDER — PANTOPRAZOLE SODIUM 40 MG PO TBEC: 40.0000 mg | DELAYED_RELEASE_TABLET | Freq: Every day | ORAL | Status: DC

## 2014-10-28 NOTE — Progress Notes (Signed)
Subjective:  Patient ID: Samantha [name], female    DOB: [birth year]  Age: 54 y.o. MRN: [id number]  54 year old lady who has been having problems with hives since Thursday night. It broke out on the right chest wall and back and both sides of her chest and in the medial thigh region. She knows of no major exposure to anything that caused it. However she had changed from brand name to generic Nexium about a month ago and had noticed some itching since then. She wonders whether that is the cause. She has thought through and does not have any knowledge of anything that has been done differently in the last few days it might is precipitated all this. She's never had hives in the past. She works doing Aeronautical engineer. She gets hot and sweaty when she is working, but that is nothing out of the ordinary for her.  Otherwise she is healthy. She has a regular primary care doctor. The Nexium has been given her for chronic GERD.   Objective:   Pleasant alert lady in no major distress. She has some mild urticaria on the back of her neck and sides of her chest. I did not examine the medial thigh area. Her chest is clear. Heart regular without murmurs. Throat is clear without any oral lesions. No uvula edema.  Assessment & Plan:   Assessment: [place] History of GERD  Plan: Discussed possibilities and could not come up with any clear-cut etiologic cause of this. It could be from the Nexium, will try different PPI for her, Protonix. Patient declined prednisone, it is caused her some difficulties in the past. I guess if we need to give a steroids would try Medrol (methylprednisolone) but will hold off on that for now. Patient Instructions  Ranitidine (Zantac) 150 mg twice daily for several days until the hives are controlled  Take one of the long-acting and histamines, Zyrtec (cetirizine) or Claritin (loratadine) or Allegra (fexofenadine). I think that the Zyrtec tends to be the best one for hives, but the others  also work.  If you're not improving we will need to put you on some steroids such as prednisone or Medrol, but will hold off for now.  Avoid getting hot and sweaty  Stop the generic Nexium that you have been taking. Begin pantoprazole(Protonix) 40 mg one daily for your reflux  Return if further problems  In the event ever of a allergic reaction that is causing breathing difficulties call 911 and or go straight to an emergency room.  Hives Hives are itchy, red, swollen areas of the skin. They can vary in size and location on your body. Hives can come and go for hours or several days (acute hives) or for several weeks (chronic hives). Hives do not spread from person to person (noncontagious). They may get worse with scratching, exercise, and emotional stress. CAUSES   Allergic reaction to food, additives, or drugs.  Infections, including the common cold.  Illness, such as vasculitis, lupus, or thyroid disease.  Exposure to sunlight, heat, or cold.  Exercise.  Stress.  Contact with chemicals. SYMPTOMS   Red or white swollen patches on the skin. The patches may change size, shape, and location quickly and repeatedly.  Itching.  Swelling of the hands, feet, and face. This may occur if hives develop deeper in the skin. DIAGNOSIS  Your caregiver can usually tell what is wrong by performing a physical exam. Skin or blood tests may also be done to determine the cause of your hives. In  some cases, the cause cannot be determined. TREATMENT  Mild cases usually get better with medicines such as antihistamines. Severe cases may require an emergency epinephrine injection. If the cause of your hives is known, treatment includes avoiding that trigger.  HOME CARE INSTRUCTIONS   Avoid causes that trigger your hives.  Take antihistamines as directed by your caregiver to reduce the severity of your hives. Non-sedating or low-sedating antihistamines are usually recommended. Do not drive while  taking an antihistamine.  Take any other medicines prescribed for itching as directed by your caregiver.  Wear loose-fitting clothing.  Keep all follow-up appointments as directed by your caregiver. SEEK MEDICAL CARE IF:   You have persistent or severe itching that is not relieved with medicine.  You have painful or swollen joints. SEEK IMMEDIATE MEDICAL CARE IF:   You have a fever.  Your tongue or lips are swollen.  You have trouble breathing or swallowing.  You feel tightness in the throat or chest.  You have abdominal pain. These problems may be the first sign of a life-threatening allergic reaction. Call your local emergency services (911 in [place] MAKE SURE YOU:   Understand these instructions.  Will watch your condition.  Will get help right away if you are not doing well or get worse. Document Released: 03/31/2005 Document Revised: 04/05/2013 Document Reviewed: 06/24/2011 [facility] Patient Information 2015 [place], [place]. This information is not intended to replace advice given to you by your health care provider. Make sure you discuss any questions you have with your health care provider.     Karen Huhta, MD 10/28/2014

## 2014-10-28 NOTE — Patient Instructions (Addendum)
Ranitidine (Zantac) 150 mg twice daily for several days until the hives are controlled  Take one of the long-acting and histamines, Zyrtec (cetirizine) or Claritin (loratadine) or Allegra (fexofenadine). I think that the Zyrtec tends to be the best one for hives, but the others also work.  If you're not improving we will need to put you on some steroids such as prednisone or Medrol, but will hold off for now.  Avoid getting hot and sweaty  Stop the generic Nexium that you have been taking. Begin pantoprazole(Protonix) 40 mg one daily for your reflux  Return if further problems  In the event ever of a allergic reaction that is causing breathing difficulties call 911 and or go straight to an emergency room.  Hives Hives are itchy, red, swollen areas of the skin. They can vary in size and location on your body. Hives can come and go for hours or several days (acute hives) or for several weeks (chronic hives). Hives do not spread from person to person (noncontagious). They may get worse with scratching, exercise, and emotional stress. CAUSES   Allergic reaction to food, additives, or drugs.  Infections, including the common cold.  Illness, such as vasculitis, lupus, or thyroid disease.  Exposure to sunlight, heat, or cold.  Exercise.  Stress.  Contact with chemicals. SYMPTOMS   Red or white swollen patches on the skin. The patches may change size, shape, and location quickly and repeatedly.  Itching.  Swelling of the hands, feet, and face. This may occur if hives develop deeper in the skin. DIAGNOSIS  Your caregiver can usually tell what is wrong by performing a physical exam. Skin or blood tests may also be done to determine the cause of your hives. In some cases, the cause cannot be determined. TREATMENT  Mild cases usually get better with medicines such as antihistamines. Severe cases may require an emergency epinephrine injection. If the cause of your hives is known,  treatment includes avoiding that trigger.  HOME CARE INSTRUCTIONS   Avoid causes that trigger your hives.  Take antihistamines as directed by your caregiver to reduce the severity of your hives. Non-sedating or low-sedating antihistamines are usually recommended. Do not drive while taking an antihistamine.  Take any other medicines prescribed for itching as directed by your caregiver.  Wear loose-fitting clothing.  Keep all follow-up appointments as directed by your caregiver. SEEK MEDICAL CARE IF:   You have persistent or severe itching that is not relieved with medicine.  You have painful or swollen joints. SEEK IMMEDIATE MEDICAL CARE IF:   You have a fever.  Your tongue or lips are swollen.  You have trouble breathing or swallowing.  You feel tightness in the throat or chest.  You have abdominal pain. These problems may be the first sign of a life-threatening allergic reaction. Call your local emergency services (911 in [place] MAKE SURE YOU:   Understand these instructions.  Will watch your condition.  Will get help right away if you are not doing well or get worse. Document Released: 03/31/2005 Document Revised: 04/05/2013 Document Reviewed: 06/24/2011 [facility] Patient Information 2015 [place], [place]. This information is not intended to replace advice given to you by your health care provider. Make sure you discuss any questions you have with your health care provider.

## 2015-01-02 ENCOUNTER — Ambulatory Visit (INDEPENDENT_AMBULATORY_CARE_PROVIDER_SITE_OTHER): Payer: BLUE CROSS/BLUE SHIELD | Admitting: Family Medicine

## 2015-01-02 ENCOUNTER — Encounter: Payer: Self-pay | Admitting: Family Medicine

## 2015-01-02 VITALS — BP 110/59 | HR 73 | Ht 67.5 in | Wt 170.0 lb

## 2015-01-02 DIAGNOSIS — M7711 Lateral epicondylitis, right elbow: Secondary | ICD-10-CM

## 2015-01-02 DIAGNOSIS — M7700 Medial epicondylitis, unspecified elbow: Secondary | ICD-10-CM | POA: Diagnosis not present

## 2015-01-02 MED ORDER — NITROGLYCERIN 0.2 MG/HR TD PT24: MEDICATED_PATCH | TRANSDERMAL | Status: DC

## 2015-01-02 NOTE — Progress Notes (Signed)
Samantha [name] - 54 y.o. female MRN [id number]  Date of birth: 1960/07/25  CC: B/l elbow pain, L>R  SUBJECTIVE:   HPI  Patient was previously seen in spring of 2016 for olecranon bursitis and bilateral medial epicondylitis. At her last visit in May 2016 she done quite well with the nitroglycerin protocol. Additionally Both were getting better with exercises. Essentially her symptoms had resolved. 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  Last month she aggravated both elbows when doing a lot of mulching.   - Icing &Advil - Unable to do many of the exercises on the right - 30-40% better than initially after injury.  - Wrist extension actually bothers her more now.  - No NTG since last visit.  - Left is bothering her medially - Right is always bothersome, both medially and laterally  - No swelling of either elbow - Rest does seem to make it better but she is unable to rest much due to her occupation. - She experiences an aching pain when she uses the elbow.  Additionally she reports that she is hyperthyroid and her TSH is been elevated. Her dose is been adjusted and she is awaiting her follow-up TSH in a few weeks.  Patient denies any fevers, chills, night sweats, rash, skin changes, nausea vomiting diarrhea constipation.   ROS:     14 point review of systems negative other than that listed in history of present illness.   HISTORY: Past Medical, Surgical, Social, and Family History Reviewed & Updated per EMR.  Pertinent Historical Findings include: No significant medical changes since her last visit.  OBJECTIVE: BP 110/59 mmHg  Pulse 73  Ht 5' 7.5" (1.715 m)  Wt 170 lb (77.111 kg)  BMI 26.22 kg/m2  Physical Exam  No acute distress Nonlabored breathing Alert and oriented 3, Extraocular motions intact  Right upper extremity: Full range of motion of the neck with negative Spurling's bilaterally Full active range of motion of the shoulder with no swelling  or palpable tenderness. Full active range of motion of the wrist and elbow with no swelling, redness, skin changes, or warmth appreciated. 5 out of 5 strength with wrist flexion and extension and elbow flexion and extension as well as forearm supination. She does experience pain with some of these maneuvers and is limited due to the pain. Specifically she has trouble with pronation and resisted wrist extension.  She is tender both over the medial and lateral epicondyles. On the right she is much more tender over the lateral epicondyle.  No instability of the bilateral elbows.  Left upper extremity: Full active range of motion of the shoulder with no swelling or palpable tenderness. Full active range of motion of the wrist and elbow with no swelling, redness, skin changes, or warmth appreciated. 5 out of 5 strength with wrist flexion and extension and elbow flexion and extension as well as forearm supination. She does experience pain with some of these maneuvers and is limited due to the pain. Specifically she has trouble with pronation and resisted wrist extension.  She is tender both over the medial epicondyle. On the left she is much more tender over the medial epicondyle.  No instability of the bilateral elbows.  Ultrasound: Right elbow  The extensor wad on the right appears to have several areas of hypoechoic disruption of the normal tendon near the insertion.  Triceps tendon on the right appears intact with a regular fibular without tendinopathy. No bursal swelling The medial  epicondyle appears relatively unremarkable.   MEDICATIONS, LABS & OTHER ORDERS: Previous Medications   ATENOLOL (TENORMIN) 25 MG TABLET    Take 12.5 mg by mouth daily.   CALCIUM-VITAMIN D (OSCAL WITH D) 250-125 MG-UNIT PER TABLET    Take 1 tablet by mouth daily.   MINIVELLE 0.075 MG/24HR       MULTIPLE VITAMIN (MULTIVITAMIN WITH MINERALS) TABS TABLET    Take 1 tablet by mouth daily.   NITROGLYCERIN (NITRODUR -  DOSED IN MG/24 HR) 0.2 MG/HR PATCH    Place 1/4 patch to affected area daily   NYSTATIN-TRIAMCINOLONE OINTMENT (MYCOLOG)       PROGESTERONE (PROMETRIUM) 100 MG CAPSULE    Take 100 mg by mouth daily.   PYRIDOXINE HCL (VITAMIN B-6 PO)    Take by mouth.   SERTRALINE (ZOLOFT) 25 MG TABLET    Take 25 mg by mouth daily.   TIROSINT 100 MCG CAPS       Modified Medications   No medications on file   New Prescriptions   No medications on file   Discontinued Medications   ESOMEPRAZOLE (NEXIUM) 40 MG CAPSULE    Take 40 mg by mouth daily at 12 noon.   ESTRADIOL (VIVELLE-DOT) 0.05 MG/24HR PATCH    Place 1 patch onto the skin 2 (two) times a week.   GABAPENTIN (NEURONTIN) 100 MG CAPSULE       LEVOTHYROXINE SODIUM (TIROSINT) 88 MCG CAPS    Take by mouth daily before breakfast.   PANTOPRAZOLE (PROTONIX) 40 MG TABLET    Take 1 tablet (40 mg total) by mouth daily.  No orders of the defined types were placed in this encounter.   ASSESSMENT & PLAN: See problem based charting & AVS for pt instructions.

## 2015-01-02 NOTE — Patient Instructions (Signed)
Nitroglycerin Protocol   Apply 1/4 nitroglycerin patch to affected area daily.  Change position of patch within the affected area every 24 hours.  You may experience a headache during the first 1-2 weeks of using the patch, these should subside.  If you experience headaches after beginning nitroglycerin patch treatment, you may take your preferred over the counter pain reliever.  Another side effect of the nitroglycerin patch is skin irritation or rash related to patch adhesive.  Please notify our office if you develop more severe headaches or rash, and stop the patch.  Tendon healing with nitroglycerin patch may require 12 to 24 weeks depending on the extent of injury.  Men should not use if taking Viagra, Cialis, or Levitra.   Do not use if you have migraines or rosacea.    See you again in 6 weeks.

## 2015-01-03 DIAGNOSIS — M25521 Pain in right elbow: Secondary | ICD-10-CM

## 2015-01-03 DIAGNOSIS — M7700 Medial epicondylitis, unspecified elbow: Secondary | ICD-10-CM | POA: Insufficient documentation

## 2015-01-03 DIAGNOSIS — G8929 Other chronic pain: Secondary | ICD-10-CM | POA: Insufficient documentation

## 2015-01-03 NOTE — Assessment & Plan Note (Signed)
Patient is a very pleasant 54 year old female with currently inadequate lately supplemented hypothyroidism who presents with bilateral medial epicondylitis and right sided lateral epicondylitis. Her medial epicondylitis is somewhat chronic although is aggravated last month after doing yard work. She responded quite nicely in the spring of 2016 with nitroglycerin and exercises. She is unable to rest significantly due to her occupation.

## 2015-01-03 NOTE — Assessment & Plan Note (Signed)
Patient is a very pleasant 54 year old female with currently inadequately controlled hypothyroidism who presents with lateral epicondylitis of the right elbow. No swelling or suggesting of looking on bursitis as in the past. Does wear compression sleeve when doing exercises - She was provided Counterforce brace for her left elbow. - She will restart on the nitroglycerin protocol on the left side. Not have any side effects in the past from the nitroglycerin after 1 day. She'll begin with a quarter patch daily - She is also given the handout for forearm strengthening exercises. - She was advised to rest as much as possible. Potentially just using her shoulders more to clean. - Step follow-up in 4-6 weeks or sooner if worsening symptoms or additional symptoms arise.

## 2015-02-06 ENCOUNTER — Ambulatory Visit: Payer: BLUE CROSS/BLUE SHIELD | Admitting: Family Medicine

## 2015-02-13 ENCOUNTER — Ambulatory Visit (INDEPENDENT_AMBULATORY_CARE_PROVIDER_SITE_OTHER): Payer: BLUE CROSS/BLUE SHIELD | Admitting: Family Medicine

## 2015-02-13 ENCOUNTER — Encounter: Payer: Self-pay | Admitting: Family Medicine

## 2015-02-13 VITALS — BP 115/56 | HR 74 | Ht 67.5 in | Wt 170.0 lb

## 2015-02-13 DIAGNOSIS — M7712 Lateral epicondylitis, left elbow: Secondary | ICD-10-CM | POA: Diagnosis not present

## 2015-02-13 DIAGNOSIS — M79643 Pain in unspecified hand: Secondary | ICD-10-CM | POA: Diagnosis not present

## 2015-02-14 NOTE — Progress Notes (Signed)
Samantha [name] - 54 y.o. female MRN [id number]  Date of birth: October 29, 1960  CC: f/u bilateral elbow and hand   SUBJECTIVE:   HPI  Left lateral epicondylitis & B/l medial epicondylitis at last visit, now only left lateral epicondylitis: - Long term history - Has done well since last visit 6 weeks ago. She rested for 1 week, which helped a lot.  - Medial epicondylitis has resolved. Now only with lateral epicondylitis on the left - counterforce brace helps a lot - Taking Advil, icing.  - NO swelling - NTG on lateral epicondyle.  - TSH wnl, feels better.   Also asking about b/l hand joint pain.  Only painful when TSH is abnormal. Initial work up by endocrinologist was negative. Reports rheumatology labs are normal.  XR of b/l hands are negative. MTP, PIP and DIP pain. 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  She occasionally needs advil, but this provides complete relief.   She denies any fevers, chills, night sweats, rash, skin changes, nausea vomiting diarrhea constipation.   ROS:     14 point review of systems negative other than that listed in history of present illness.  HISTORY: Past Medical, Surgical, Social, and Family History Reviewed & Updated per EMR.    OBJECTIVE: BP 115/56 mmHg  Pulse 74  Ht 5' 7.5" (1.715 m)  Wt 170 lb (77.111 kg)  BMI 26.22 kg/m2  Physical Exam  No acute distress Nonlabored breathing Alert and oriented 3, Extraocular motions intact  Left: Full active range of motion of the wrist and elbow with no swelling, redness, skin changes, or warmth appreciated. 5 out of 5 strength with wrist flexion and extension and elbow flexion and extension as well as forearm supination.Experiences pain with resisted wrist extenstion.  She is tender both over the lateral epicondyles. On the right she is much more tender over the lateral epicondyle.  No instability of the bilateral elbows.  Right: Full active range of motion of the  wrist and elbow with no swelling, redness, skin changes, or warmth appreciated. 5 out of 5 strength with wrist flexion and extension and elbow flexion and extension as well as forearm supination. No longer having pain with resisted motion She is no longer tende over the medial epicondyle.   Hands: No swelling, erythema or warmth.  She does have heberden's nodes over several of her DIPs.   MEDICATIONS, LABS & OTHER ORDERS: Previous Medications   ATENOLOL (TENORMIN) 25 MG TABLET    Take 12.5 mg by mouth daily.   CALCIUM-VITAMIN D (OSCAL WITH D) 250-125 MG-UNIT PER TABLET    Take 1 tablet by mouth daily.   CLOBETASOL OINTMENT (TEMOVATE) 0.05 %       ESOMEPRAZOLE (NEXIUM) 40 MG CAPSULE       MINIVELLE 0.075 MG/24HR       MULTIPLE VITAMIN (MULTIVITAMIN WITH MINERALS) TABS TABLET    Take 1 tablet by mouth daily.   NITROGLYCERIN (NITRODUR - DOSED IN MG/24 HR) 0.2 MG/HR PATCH    Place 1/4 patch to affected area daily   NYSTATIN-TRIAMCINOLONE OINTMENT (MYCOLOG)       PROGESTERONE (PROMETRIUM) 100 MG CAPSULE    Take 100 mg by mouth daily.   PYRIDOXINE HCL (VITAMIN B-6 PO)    Take by mouth.   SERTRALINE (ZOLOFT) 25 MG TABLET    Take 25 mg by mouth daily.   TIROSINT 100 MCG CAPS       Modified  Medications   No medications on file   New Prescriptions   No medications on file   Discontinued Medications   No medications on file  No orders of the defined types were placed in this encounter.   ASSESSMENT & PLAN: See problem based charting & AVS for pt instructions.

## 2015-02-15 DIAGNOSIS — M19049 Primary osteoarthritis, unspecified hand: Secondary | ICD-10-CM | POA: Insufficient documentation

## 2015-02-15 NOTE — Assessment & Plan Note (Signed)
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 Reviewed outside XR which is wnl. Heberden's nodes on exam.  Ibuprofen helps a lot.  Most likely OA.  Had labs done elsewhere.  She will bring those with her for her next appointment and we can review them.  No systemic symptoms.

## 2015-02-15 NOTE — Assessment & Plan Note (Signed)
54 yo with hypothyroidism with lateral epicondylitis, now of the left elbow.  Doing better with compression sleeves and rehab protocol along with NTG.  Wearing brace.  - began NTG 6 weeks ago, tolerating well.  - Continue with exercises.   - f/u 6 weeks.

## 2015-02-27 ENCOUNTER — Encounter: Payer: Self-pay | Admitting: Sports Medicine

## 2015-03-20 ENCOUNTER — Ambulatory Visit (INDEPENDENT_AMBULATORY_CARE_PROVIDER_SITE_OTHER): Payer: BLUE CROSS/BLUE SHIELD | Admitting: Family Medicine

## 2015-03-20 ENCOUNTER — Encounter: Payer: Self-pay | Admitting: Family Medicine

## 2015-03-20 VITALS — BP 114/62 | Ht 67.5 in | Wt 170.0 lb

## 2015-03-20 DIAGNOSIS — M19042 Primary osteoarthritis, left hand: Secondary | ICD-10-CM | POA: Diagnosis not present

## 2015-03-20 DIAGNOSIS — M7712 Lateral epicondylitis, left elbow: Secondary | ICD-10-CM | POA: Diagnosis not present

## 2015-03-20 DIAGNOSIS — M19041 Primary osteoarthritis, right hand: Secondary | ICD-10-CM | POA: Diagnosis not present

## 2015-03-20 MED ORDER — DICLOFENAC SODIUM 1 % TD GEL: 2.0000 g | Freq: Four times a day (QID) | TRANSDERMAL | Status: DC

## 2015-03-20 NOTE — Progress Notes (Signed)
Samantha [name] - 54 y.o. female MRN [id number]  Date of birth: [birth year]  CC: f/u bilateral elbow and hand   SUBJECTIVE:   HPI  Left lateral epicondylitis - Chronic problem.   - Has done well since last visit 6 weeks ago.  - Counterforce brace helps a lot. She is continuing to use this. - Taking Advil, icing.  - NO swelling - NTG on lateral epicondyle and lateral soft spot.  Thinks she is having more posterior pain (she does have a history of olecranon bursitis).  - TSH wnl, feels better. Thinks it was last 1.1.   OA of the hands:  - PIP and DIP nodule development as well as 2nd right MCP.  -  Pain and stiffness.   -  Initial work up by endocrinologist was negative.  - Rheumatology labs are normal other than mildly elevated ESR of 28 (nml 0-20). -  XR of b/l hands are negative.  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   - She occasionally takes advil, which provides complete relief.   She denies any fevers, chills, night sweats, rash, skin changes, nausea vomiting diarrhea constipation.   ROS:     14 point review of systems negative other than that listed in history of present illness.  HISTORY: Past Medical, Surgical, Social, and Family History Reviewed & Updated per EMR.    OBJECTIVE: BP 114/62 mmHg  Ht 5' 7.5" (1.715 m)  Wt 170 lb (77.111 kg)  BMI 26.22 kg/m2  Physical Exam  No acute distress Nonlabored breathing Alert and oriented 3, Extraocular motions intact  Left: Full active range of motion of the wrist and elbow with no swelling, redness, skin changes, or warmth appreciated. Tender with firm palpation over left lateral soft spot as well 5 out of 5 strength with wrist flexion and extension and elbow flexion and extension as well as forearm supination and elbow extension.  Experiences pain with mild pain resisted wrist extenstion although she also has a different type of pain that is more difficult to localize.  She is tender both  over the lateral epicondyle. On the left she is no longer tender. No instability of the bilateral elbows.  Hands: No swelling, erythema or warmth.  She does have heberden's nodes as well as  Bouchard nodes of both hands, R>L. Full RoM without stiffness.   MEDICATIONS, LABS & OTHER ORDERS: Previous Medications   ATENOLOL (TENORMIN) 25 MG TABLET    Take 12.5 mg by mouth daily.   CALCIUM-VITAMIN D (OSCAL WITH D) 250-125 MG-UNIT PER TABLET    Take 1 tablet by mouth daily.   CLOBETASOL OINTMENT (TEMOVATE) 0.05 %       ESOMEPRAZOLE (NEXIUM) 40 MG CAPSULE       MINIVELLE 0.075 MG/24HR       MULTIPLE VITAMIN (MULTIVITAMIN WITH MINERALS) TABS TABLET    Take 1 tablet by mouth daily.   NITROGLYCERIN (NITRODUR - DOSED IN MG/24 HR) 0.2 MG/HR PATCH    Place 1/4 patch to affected area daily   NYSTATIN-TRIAMCINOLONE OINTMENT (MYCOLOG)       PROGESTERONE (PROMETRIUM) 100 MG CAPSULE    Take 100 mg by mouth daily.   PYRIDOXINE HCL (VITAMIN B-6 PO)    Take by mouth.   SERTRALINE (ZOLOFT) 25 MG TABLET    Take 25 mg by mouth daily.   TIROSINT 100 MCG CAPS       Modified Medications   No medications  on file   New Prescriptions   DICLOFENAC SODIUM (VOLTAREN) 1 % GEL    Apply 2 g topically 4 (four) times daily.   Discontinued Medications   No medications on file  No orders of the defined types were placed in this encounter.   ASSESSMENT & PLAN: See problem based charting & AVS for pt instructions.

## 2015-03-21 NOTE — Assessment & Plan Note (Signed)
54 yo with hypothyroidism with lateral epicondylitis, now mild on the left.  Doing better with compression sleeves and rehab protocol along with NTG.  Currently not even having pain over the lateral epicondyle, more so the lateral soft spot.  She has a history of olecranon bursitis, but is exam is not consistent with that today.  Wearing brace.  - She completed 10 weeks of NTG.  - Continue with exercises as maintenance.  - Considering her mild symptoms, she will discontinue NTG and f/u as needed.   - f/u prn

## 2015-03-21 NOTE — Assessment & Plan Note (Addendum)
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 Reviewed outside XR which is wnl. Heberden's & Bouchard nodes on exam.  Ibuprofen helps a lot, completely resolves pain.  Most likely OA.  Outside labs appear unremarkable.  - She will also trial voltaren gel to reduce chronic oral NSAID use.  - f/u prn

## 2015-09-04 DIAGNOSIS — H9041 Sensorineural hearing loss, unilateral, right ear, with unrestricted hearing on the contralateral side: Secondary | ICD-10-CM | POA: Diagnosis not present

## 2015-09-04 DIAGNOSIS — R600 Localized edema: Secondary | ICD-10-CM | POA: Diagnosis not present

## 2015-09-04 DIAGNOSIS — R51 Headache: Secondary | ICD-10-CM | POA: Diagnosis not present

## 2015-09-04 DIAGNOSIS — J3489 Other specified disorders of nose and nasal sinuses: Secondary | ICD-10-CM | POA: Diagnosis not present

## 2015-09-04 DIAGNOSIS — R6889 Other general symptoms and signs: Secondary | ICD-10-CM | POA: Diagnosis not present

## 2015-09-05 DIAGNOSIS — E038 Other specified hypothyroidism: Secondary | ICD-10-CM | POA: Diagnosis not present

## 2015-09-11 DIAGNOSIS — Z79890 Hormone replacement therapy (postmenopausal): Secondary | ICD-10-CM | POA: Diagnosis not present

## 2015-09-11 DIAGNOSIS — E038 Other specified hypothyroidism: Secondary | ICD-10-CM | POA: Diagnosis not present

## 2015-09-14 DIAGNOSIS — L249 Irritant contact dermatitis, unspecified cause: Secondary | ICD-10-CM | POA: Diagnosis not present

## 2015-11-06 DIAGNOSIS — Z0000 Encounter for general adult medical examination without abnormal findings: Secondary | ICD-10-CM | POA: Diagnosis not present

## 2015-11-06 DIAGNOSIS — E038 Other specified hypothyroidism: Secondary | ICD-10-CM | POA: Diagnosis not present

## 2015-11-13 DIAGNOSIS — M79641 Pain in right hand: Secondary | ICD-10-CM | POA: Diagnosis not present

## 2015-11-13 DIAGNOSIS — E038 Other specified hypothyroidism: Secondary | ICD-10-CM | POA: Diagnosis not present

## 2015-11-13 DIAGNOSIS — M79642 Pain in left hand: Secondary | ICD-10-CM | POA: Diagnosis not present

## 2015-11-13 DIAGNOSIS — F418 Other specified anxiety disorders: Secondary | ICD-10-CM | POA: Diagnosis not present

## 2015-11-13 DIAGNOSIS — Z1389 Encounter for screening for other disorder: Secondary | ICD-10-CM | POA: Diagnosis not present

## 2015-11-13 DIAGNOSIS — Z0000 Encounter for general adult medical examination without abnormal findings: Secondary | ICD-10-CM | POA: Diagnosis not present

## 2015-11-27 ENCOUNTER — Ambulatory Visit: Payer: BLUE CROSS/BLUE SHIELD | Admitting: Sports Medicine

## 2015-12-10 DIAGNOSIS — Z1212 Encounter for screening for malignant neoplasm of rectum: Secondary | ICD-10-CM | POA: Diagnosis not present

## 2015-12-10 DIAGNOSIS — Z1211 Encounter for screening for malignant neoplasm of colon: Secondary | ICD-10-CM | POA: Diagnosis not present

## 2015-12-11 ENCOUNTER — Ambulatory Visit (INDEPENDENT_AMBULATORY_CARE_PROVIDER_SITE_OTHER): Payer: BLUE CROSS/BLUE SHIELD | Admitting: Sports Medicine

## 2015-12-11 DIAGNOSIS — M5030 Other cervical disc degeneration, unspecified cervical region: Secondary | ICD-10-CM

## 2015-12-11 DIAGNOSIS — M216X9 Other acquired deformities of unspecified foot: Secondary | ICD-10-CM

## 2015-12-11 MED ORDER — AMITRIPTYLINE HCL 25 MG PO TABS: 25.0000 mg | ORAL_TABLET | Freq: Every day | ORAL | 1 refills | Status: DC

## 2015-12-11 MED ORDER — VITAMIN B-6 50 MG PO TABS: 50.0000 mg | ORAL_TABLET | Freq: Two times a day (BID) | ORAL | 2 refills | Status: DC

## 2015-12-11 NOTE — Assessment & Plan Note (Signed)
I think this is a new flare of this older problem  Probably triggered by dog jerking on leash  See plan

## 2015-12-11 NOTE — Progress Notes (Signed)
   Subjective:    Patient ID: Samantha [name], female    DOB: [birth year], 55 y.o.   MRN: [id number]  Ms. Rummler is a 55 year old Caucasian female, who presents to Sports Medicine clinic today with chief complaint of right elbow pain and right mid thoracic back pain. She reports that she has been dealing with olecranon bursitis and issues related to right elbow for last 2 years, but has not improved despite PT and NSAIDs. She reports within last year she has adopted a boxer Warden/ranger. She reports that she does take the dog on walk, uses right hand for holding leash. She describes where the dog would tug at least and cause pain in right lateral elbow. Pronation is also aggravating factor. Describes nerve like pain radiating down to 5th digit, with numbness and tingling. Also reports additional numbness and tingling in right thumb at night. She does report she has neck arthritis, but no current neck pain. In last 2 weeks, has reported mid thoracic back pain just medial to thoracic spine.    Past Hx : significant lower Cervical spine DJD and DDD on evaluaiton in 2015   Review of Systems  All other systems reviewed and are negative.  No radicular sxs to left arm No swelling of either elbow joint Pain not over lateral elbow as noted in past year     Objective:   Physical Exam  Constitutional: She is oriented to person, place, and time. She appears well-developed and well-nourished.  Cardiovascular: Normal rate and intact distal pulses.   Pulmonary/Chest: Effort normal.  Musculoskeletal:  Inspection reveals no erythema, warmth or effusion of elbow. Does have TTP in right lateral elbow over lateral epicondyle. Have full flexion, extension, supination, and pronation of right elbow. Tinel's sign in lateral elbow does reproduce pain, but no pain elicited in cubital tunnel or Guyon's tunnel. Spurling negative. She does have weakness in triceps region and in C7 dermatome region, with weakness in right 5th digit  flexion.  Neurological: She is alert and oriented to person, place, and time.  Skin: Skin is warm and dry.  Psychiatric: She has a normal mood and affect.   XRay reviewed and shows the major changes to be c6 to T1 with djd and ddd       Assessment & Plan:    Cervical radiculopathy -Actually believe right elbow pain is from cervical radiculopathy, as she has weakness and sensation deficits in dermatomal fashion along C7 -Did review prior neck imaging, which does show arthritic changes in C6 and C7 -Amitriptyline 25 mg qHS, discussed dizziness, sedation, dry mouth, and urinary retention as possible side effects -Also prescribe pyridoxine 50 mg BID -PT exercises to do at home  RTC in 4 weeks

## 2016-01-22 ENCOUNTER — Ambulatory Visit: Payer: BLUE CROSS/BLUE SHIELD | Admitting: Sports Medicine

## 2016-01-29 ENCOUNTER — Encounter: Payer: Self-pay | Admitting: Sports Medicine

## 2016-01-29 ENCOUNTER — Ambulatory Visit (INDEPENDENT_AMBULATORY_CARE_PROVIDER_SITE_OTHER): Payer: BLUE CROSS/BLUE SHIELD | Admitting: Sports Medicine

## 2016-01-29 DIAGNOSIS — M5030 Other cervical disc degeneration, unspecified cervical region: Secondary | ICD-10-CM | POA: Diagnosis not present

## 2016-01-29 MED ORDER — METHOCARBAMOL 500 MG PO TABS
500.0000 mg | ORAL_TABLET | Freq: Three times a day (TID) | ORAL | 3 refills | Status: DC
Start: 2016-01-29 — End: 2017-07-23

## 2016-01-29 NOTE — Assessment & Plan Note (Signed)
She has improved her posture and her neck motion Cont exercises for that  Cont regular meds but trial on Robaxin for mm spasm  Reck pending her response

## 2016-01-29 NOTE — Progress Notes (Signed)
   Subjective:  Patient ID: Samantha [name], female    DOB: 04-25-1960  Age: 55 y.o. MRN: [id number]  CC:  Right Shoulder and Elbow Pain Follow Up  HPI Samantha [name] is a 55 year old female who presents today for follow up for right shoulder pain and right elbow pain. Patient reports that she has tried the amitriptyline and that while it worked, she had a lot of blurry vision with it so she stopped taking it. Patient reports the pain is constant and feels like an "irritating" type pain  on the right shoulder and back that gets aggravated by motions such as "putting up dishes" or laying down on her right side. Patient reports that the pain radiates to her finger when aggravated. Along with aggravated pain, she gets tingling in her fingers with the same movements.  ROS addendeum:  Patient denies any neck pain, fever, swelling, or sudden weight loss.  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 Normal strength. Negative empty can test. Neurovascularly intact  Left Shoulder: No lesions, deformities, or swelling. No tenderness. Normal ROM. Normal strength. Negative empty can test. Neurovascularly intact.   Right Elbow:  No lesions, deformities, or swelling. No tenderness. Normal ROM. Normal strength. Neurovascularly intact.   Left Elbow:  No lesions, deformities, or swelling. No tenderness. Normal ROM. Normal strength.  Neurovascularly  intact.   Right Hand: Tingling in thumb and pinky. No lesions, deformities, or swelling. No tenderness. Normal ROM. Normal strength. Negative Tinel's. Neurovascularly intact.   Left Hand:  No lesions, deformities, or swelling. No tenderness. Normal ROM. Normal strength. Negative Tinel's. Neurovascularly intact.     Assessment & Plan:   1. Cervical Radiculopathy Patient has been doing exercises given. However, she discontinued amitriptyline due to sedative side effects. Patient says that back massages from her son have helped. Discussed the option of using a muscle relaxer to alleviate cervical symptoms. Decided on Robaxin due to less sedative side effects for patient. Patient agreed. Will follow up in 1 month to reassess patient's symptoms.    Meds ordered this encounter  Medications  . methocarbamol (ROBAXIN) 500 MG tablet    Sig: Take 1 tablet (500 mg total) by mouth 3 (three) times daily.    Dispense:  90 tablet    Refill:  3    Follow-up: No Follow-up on file.   Salvadore Dom, Medical Student  I observed and examined the patient with the resident and agree with assessment and plan.  Note reviewed and modified by me. Ysidro Evert

## 2016-02-01 DIAGNOSIS — H43813 Vitreous degeneration, bilateral: Secondary | ICD-10-CM | POA: Diagnosis not present

## 2016-02-22 ENCOUNTER — Ambulatory Visit (INDEPENDENT_AMBULATORY_CARE_PROVIDER_SITE_OTHER): Payer: BLUE CROSS/BLUE SHIELD | Admitting: Student

## 2016-02-22 ENCOUNTER — Encounter: Payer: Self-pay | Admitting: Student

## 2016-02-22 VITALS — BP 111/71 | HR 73 | Ht 67.5 in | Wt 170.0 lb

## 2016-02-22 DIAGNOSIS — M7662 Achilles tendinitis, left leg: Secondary | ICD-10-CM | POA: Diagnosis not present

## 2016-02-22 DIAGNOSIS — M79671 Pain in right foot: Secondary | ICD-10-CM | POA: Diagnosis not present

## 2016-02-22 DIAGNOSIS — M79673 Pain in unspecified foot: Secondary | ICD-10-CM | POA: Insufficient documentation

## 2016-02-22 MED ORDER — MELOXICAM 15 MG PO TABS: 15.0000 mg | ORAL_TABLET | Freq: Every day | ORAL | 1 refills | Status: DC

## 2016-02-22 MED ORDER — NITROGLYCERIN 0.2 MG/HR TD PT24: MEDICATED_PATCH | TRANSDERMAL | 1 refills | Status: DC

## 2016-02-22 NOTE — Progress Notes (Signed)
  Samantha [name] - 55 y.o. female MRN [id number]  Date of birth: 03/13/1961  SUBJECTIVE:  Including CC & ROS.  CC: left achilles pain and right heel pain Presents with right heel pain that has been ongoing for 3 weeks and left achilles pain that has been ongoing for 2 weeks.  She states that the heel pain is worse when she walks on her feet. She has had a history of plantar fasciitis and Achilles tendinitis in this foot but this feels different. She has been icing it without too much relief. Denies numbness or tingling distally.   Her Achilles pain has been ongoing for the past 2 weeks. Worse with walking and push off. She has had this in the right foot and never on the left foot. She has done nitroglycerin patches in the past for this.   ROS: No unexpected weight loss, fever, chills, swelling, instability, muscle pain, numbness/tingling, redness, otherwise see HPI   PMHx - Updated and reviewed.  Contributory factors include: Hypothyroidism, plantar fasciitis, Achilles tendinitis PSHx - Updated and reviewed.  Contributory factors include:  Negative FHx - Updated and reviewed.  Contributory factors include:  Negative Social Hx - Updated and reviewed. Contributory factors include: Negative Medications - reviewed   DATA REVIEWED: Previous office visits  PHYSICAL EXAM:  VS: BP:111/71  HR:73bpm  TEMP: ( )  RESP:   HT:5' 7.5" (171.5 cm)   WT:170 lb (77.1 kg)  BMI:26.3 PHYSICAL EXAM: Gen: NAD, alert, cooperative with exam, well-appearing HEENT: clear conjunctiva,  CV:  no edema, capillary refill brisk, normal rate Resp: non-labored Skin: no rashes, normal turgor  Neuro: no gross deficits.  Psych:  alert and oriented  Ankle & Foot: No visible swelling, ecchymosis, erythema, ulcers, calluses, blister Arch: pes cavus bilaterally Achilles tendon with tenderness on left No swelling of retrocalcaneal bursa She has tenderness to palpation more on the medial aspect of the plantar calcaneus. It is  not at the insertion of the plantar fasciitis but posterior to it. No pain at MT heads No pain at base of 5th MT; No tenderness over cuboid; No tenderness over N spot or navicular prominence No tenderness on lateral and medial malleolus No sign of peroneal tendon subluxations or tenderness to palpation Full in plantarflexion, dorsiflexion, inversion, and eversion of the foot; flexion and extension of the toes Strength: 5/5 in all directions. Sensation: intact Vascular: intact w/ dorsalis pedis & posterior tibialis pulses 2+  Ultrasound: Limited ultrasound of the left Achilles performed and long and short axis. Shows hypertrophy and swelling 2 cm superior to the insertion on left.  Small mid substance tear at this area as well.  No nodules were seen. Measured 0.66 cm. Findings consistent with acute Achilles tendinitis   ASSESSMENT & PLAN:   Achilles tendinitis We'll treat the left Achilles with heel pads to help with taking pressure off the Achilles. Nitroglycerin patches were given because there is a small tear seen.  Mobic was given for pain and swelling.  Inflammatory heel pain Plantar surface of her right medial with some swelling. Gave heel pads to help take pressure off the area. She continues moving her pain is ice to help with swelling.

## 2016-02-22 NOTE — Assessment & Plan Note (Addendum)
We'll treat the left Achilles with heel pads to help with taking pressure off the Achilles. Nitroglycerin patches were given because there is a small tear seen.  Mobic was given for pain and swelling.

## 2016-02-22 NOTE — Patient Instructions (Signed)

## 2016-02-22 NOTE — Assessment & Plan Note (Signed)
Plantar surface of her right medial with some swelling. Gave heel pads to help take pressure off the area. She continues moving her pain is ice to help with swelling.

## 2016-02-25 ENCOUNTER — Other Ambulatory Visit: Payer: Self-pay | Admitting: *Deleted

## 2016-02-25 ENCOUNTER — Telehealth: Payer: Self-pay | Admitting: *Deleted

## 2016-02-25 MED ORDER — DICLOFENAC SODIUM 75 MG PO TBEC: 75.0000 mg | DELAYED_RELEASE_TABLET | Freq: Two times a day (BID) | ORAL | 0 refills | Status: DC

## 2016-02-25 NOTE — Telephone Encounter (Signed)
Per Dr Micheline Chapman, d/c the mobic and start diclofenac 75mg  BID for the next week and if sxs are not better, we will have her come in before the holiday break next week to be seen.   Pt voiced ok to this regimen.

## 2016-03-02 ENCOUNTER — Encounter: Payer: Self-pay | Admitting: Sports Medicine

## 2016-03-03 ENCOUNTER — Other Ambulatory Visit: Payer: Self-pay | Admitting: *Deleted

## 2016-03-03 MED ORDER — DICLOFENAC SODIUM 75 MG PO TBEC: 75.0000 mg | DELAYED_RELEASE_TABLET | Freq: Two times a day (BID) | ORAL | 0 refills | Status: DC

## 2016-03-11 DIAGNOSIS — E038 Other specified hypothyroidism: Secondary | ICD-10-CM | POA: Diagnosis not present

## 2016-03-25 ENCOUNTER — Ambulatory Visit (INDEPENDENT_AMBULATORY_CARE_PROVIDER_SITE_OTHER): Payer: BLUE CROSS/BLUE SHIELD | Admitting: Sports Medicine

## 2016-03-25 ENCOUNTER — Encounter: Payer: Self-pay | Admitting: Sports Medicine

## 2016-03-25 DIAGNOSIS — M79671 Pain in right foot: Secondary | ICD-10-CM

## 2016-03-25 DIAGNOSIS — M7662 Achilles tendinitis, left leg: Secondary | ICD-10-CM | POA: Diagnosis not present

## 2016-03-25 MED ORDER — DICLOFENAC SODIUM 75 MG PO TBEC: 75.0000 mg | DELAYED_RELEASE_TABLET | Freq: Two times a day (BID) | ORAL | 5 refills | Status: DC

## 2016-03-25 NOTE — Assessment & Plan Note (Signed)
This has not improved so I added arch pad and heel cushion to her work shoes  Use her heel cups in her leather shoes  We could consider custom orthotics if this pain does not resolve

## 2016-03-25 NOTE — Assessment & Plan Note (Signed)
This continues to improve  Keep up home exercise program  Keep up nitroglycerin protocol  Recheck and repeat scan in 2 months

## 2016-03-25 NOTE — Progress Notes (Signed)
  Chief complaint: right heel pain in left Achilles tendon pain  She was seen one month ago and treated for Achilles tendinopathy She had marked relief of pain with nitroglycerin She is doing her home exercises She feels that the Achilles is significantly improved  Right hip pain however persists This feels to be around the calcaneus on the plantar surface Soft heel cups were helpful in her regular shoes She still feels the pressure in sports shoes that she does for work  Past history Degenerative cervical disc disease Cavus foot deformity  Review of systems Significant relief of her pain radiating into her shoulder and arm with use of Voltaren No radicular symptoms to the arm Numbness and some shoulder pain if she sleeps in a funny position  Physical examination No acute distress BP 125/69   Ht 5' 7.5" (1.715 m)   Wt 171 lb (77.6 kg)   BMI 26.39 kg/m   Right foot Mild tenderness to palpation directly over the calcaneus This is not at the insertion of the plantar fascia but rather at the bone medially and laterally Cavus-type foot with some loss of arch  Left foot There is an Achilles nodule noted about 2 cm above the calcaneus This is nontender today No redness or swelling  Ultrasound of the left Achilles There is much less edema noted with resolution of hypoechoic change Thickness if nodule still measures 0.67 cm No real neo-vessels noted Achilles looks normal at the insertion and measures 0.52 cm  Ultrasound of right calcaneus There is some small spurring along the cortical margin that is only 2-3 mm This noted medially and laterally and on the plantar surface Slight hypoechoic change Plantar fascia appears normal

## 2016-04-01 ENCOUNTER — Ambulatory Visit: Payer: BLUE CROSS/BLUE SHIELD | Admitting: Sports Medicine

## 2016-06-03 DIAGNOSIS — Z01419 Encounter for gynecological examination (general) (routine) without abnormal findings: Secondary | ICD-10-CM | POA: Diagnosis not present

## 2016-06-03 DIAGNOSIS — Z1231 Encounter for screening mammogram for malignant neoplasm of breast: Secondary | ICD-10-CM | POA: Diagnosis not present

## 2016-06-03 DIAGNOSIS — Z6827 Body mass index (BMI) 27.0-27.9, adult: Secondary | ICD-10-CM | POA: Diagnosis not present

## 2016-06-16 ENCOUNTER — Ambulatory Visit
Admission: RE | Admit: 2016-06-16 | Discharge: 2016-06-16 | Disposition: A | Discharge status: Home / Self Care | Payer: BLUE CROSS/BLUE SHIELD | Source: Ambulatory Visit | Attending: Sports Medicine | Admitting: Sports Medicine

## 2016-06-16 ENCOUNTER — Ambulatory Visit (INDEPENDENT_AMBULATORY_CARE_PROVIDER_SITE_OTHER): Payer: BLUE CROSS/BLUE SHIELD | Admitting: Sports Medicine

## 2016-06-16 ENCOUNTER — Other Ambulatory Visit: Payer: Self-pay | Admitting: *Deleted

## 2016-06-16 ENCOUNTER — Encounter: Payer: Self-pay | Admitting: Sports Medicine

## 2016-06-16 VITALS — BP 111/64 | HR 70 | Ht 67.5 in | Wt 171.0 lb

## 2016-06-16 DIAGNOSIS — M79671 Pain in right foot: Secondary | ICD-10-CM

## 2016-06-16 NOTE — Progress Notes (Addendum)
   Subjective:    Patient ID: Samantha [name], female    DOB: 07-Jan-1961, 56 y.o.   MRN: [id number]  HPI  Samantha [name] is a 56 year old woman who presents to the clinic with persistent right heel pain. She was seen in December 2017 with achilles tendinopathy and prescribed nitroglycerin. And currently wears orthotics in shoes. 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  She notices the pain first thing in the morning, and feels a lump in her heel. The pain sometimes radiates to the front and to the side of the foot.  She had an ultrasound of the area which showed a small bone spur.  She also has been taking NSAIDs.    Review of Systems Unremarkable.    Objective:   Physical Exam  She is tender to palpation directly in the center of her right calcaneus. No pain with plantar fascial stretching. No soft tissue swelling. Neurovascular intact distally.  MSK ultrasound of her right calcaneus shows a calcaneal spur with surrounding edema. Plantar fascia is unchanged in thickness when compared to her previous ultrasound done by Dr. Oneida Alar.       Assessment & Plan:  Right heel pain related to plantar fasciitis and bone spur.  Plan -Patient was given a horseshoe shaped cushion to be placed in her shoe. Xrays were also ordered of the right heel. -Pt was asked to continue the nitroglycerin patches Asked to return in 4 weeks.   Patient seen and evaluated with the resident. I agree with the above plan of care. I would like to get an x-ray of her calcaneus just to evaluate the size of the spur seen on today's ultrasound. She will try a horseshoe pad heel lift or she could get a thicker gel cup at a local drugstore. I did encourage daily icing and plantar fascial stretching even though this is not classic plantar fasciitis. Follow-up with me in 4 weeks for reevaluation.  Addendum: X-ray reviewed. Small calcaneal spur along the plantar aspect of the calcaneus  noted. Nothing acute.

## 2016-06-17 DIAGNOSIS — N950 Postmenopausal bleeding: Secondary | ICD-10-CM | POA: Diagnosis not present

## 2016-07-16 ENCOUNTER — Encounter: Payer: Self-pay | Admitting: Student

## 2016-07-16 ENCOUNTER — Ambulatory Visit (INDEPENDENT_AMBULATORY_CARE_PROVIDER_SITE_OTHER): Payer: BLUE CROSS/BLUE SHIELD | Admitting: Student

## 2016-07-16 VITALS — BP 119/63 | Ht 67.5 in | Wt 171.0 lb

## 2016-07-16 DIAGNOSIS — M722 Plantar fascial fibromatosis: Secondary | ICD-10-CM | POA: Diagnosis not present

## 2016-07-16 MED ORDER — METHYLPREDNISOLONE ACETATE 40 MG/ML IJ SUSP
40.0000 mg | Freq: Once | INTRAMUSCULAR | Status: AC
  Administered 2016-07-16: 40 mg via INTRA_ARTICULAR

## 2016-07-16 NOTE — Progress Notes (Signed)
  Samantha [name] - 56 y.o. female MRN [id number]  Date of birth: 08-31-1960  SUBJECTIVE:  Including CC & ROS.  CC: right heel pain  Presents with right heel pain that is been ongoing for months. She is concerned because she has a left Achilles tendinitis and right plantar fasciitis. She feels as though her gait is off because of this. She is doing fine after her last visit until she had fallen down a couple stairs and feels like both have been flared up since that time. She would like help with, and at least one of these to try to decrease the pain. She has not been doing stretches in the morning for her plantar fasciitis but she has been icing it. She is doing exercises for her Achilles tendinitis. She asks if she can have an injection today.   ROS: No unexpected weight loss, fever, chills, swelling, instability, muscle pain, numbness/tingling, redness, otherwise see HPI   PMHx - Updated and reviewed.  Contributory factors include: Negative PSHx - Updated and reviewed.  Contributory factors include:  Negative FHx - Updated and reviewed.  Contributory factors include:  Negative Social Hx - Updated and reviewed. Contributory factors include: Negative Medications - reviewed   DATA REVIEWED: Previous office visits, x-rays, which show a calcaneal spur  PHYSICAL EXAM:  VS: BP:119/63  HR: bpm  TEMP: ( )  RESP:   HT:5' 7.5" (171.5 cm)   WT:171 lb (77.6 kg)  BMI:26.4 PHYSICAL EXAM: Gen: NAD, alert, cooperative with exam, well-appearing HEENT: clear conjunctiva,  CV:  no edema, capillary refill brisk, normal rate Resp: non-labored Skin: no rashes, normal turgor  Neuro: no gross deficits.  Psych:  alert and oriented  Ankle & Foot: No visible swelling, ecchymosis, erythema, ulcers, calluses, blister TTP over medial plantar fascia proximal attachment on right Full in plantarflexion, dorsiflexion, inversion, and eversion of the foot; flexion and extension of the toes Strength: 5/5 in all  directions. Sensation: intact Vascular: intact w/ dorsalis pedis & posterior tibialis pulses 2+    ASSESSMENT & PLAN:   Plantar fasciitis, right Plantar fasciitis was injected today. Tolerated procedure well. She'll follow-up in 4 weeks unless she is better in both of these areas, plantar fasciitis and the Achilles tendinitis.  Procedure:  Injection of right plantar fascia Consent obtained and verified. Time-out conducted. Noted no overlying erythema, induration, or other signs of local infection. Skin prepped in a sterile fashion. Topical analgesic spray: Ethyl chloride. Completed without difficulty. Meds: 40 mg depomedrol, 1 cc 1% lidocaine Pain immediately improved suggesting accurate placement of the medication. Advised to call if fevers/chills, erythema, induration, drainage, or persistent bleeding.

## 2016-07-16 NOTE — Assessment & Plan Note (Signed)
Plantar fasciitis was injected today. Tolerated procedure well. She'll follow-up in 4 weeks unless she is better in both of these areas, plantar fasciitis and the Achilles tendinitis.

## 2016-07-22 ENCOUNTER — Ambulatory Visit: Payer: BLUE CROSS/BLUE SHIELD | Admitting: Sports Medicine

## 2016-07-25 DIAGNOSIS — M859 Disorder of bone density and structure, unspecified: Secondary | ICD-10-CM | POA: Diagnosis not present

## 2016-08-13 ENCOUNTER — Ambulatory Visit: Payer: BLUE CROSS/BLUE SHIELD | Admitting: Student

## 2016-10-23 ENCOUNTER — Ambulatory Visit (INDEPENDENT_AMBULATORY_CARE_PROVIDER_SITE_OTHER): Payer: BLUE CROSS/BLUE SHIELD | Admitting: Sports Medicine

## 2016-10-23 ENCOUNTER — Ambulatory Visit: Payer: Self-pay

## 2016-10-23 ENCOUNTER — Encounter: Payer: Self-pay | Admitting: Sports Medicine

## 2016-10-23 VITALS — BP 100/62 | Ht 67.5 in | Wt 170.0 lb

## 2016-10-23 DIAGNOSIS — M7711 Lateral epicondylitis, right elbow: Secondary | ICD-10-CM

## 2016-10-23 DIAGNOSIS — M7662 Achilles tendinitis, left leg: Secondary | ICD-10-CM

## 2016-10-23 MED ORDER — NITROGLYCERIN 0.2 MG/HR TD PT24: MEDICATED_PATCH | TRANSDERMAL | 1 refills | Status: DC

## 2016-10-23 NOTE — Progress Notes (Signed)
   Subjective:    Patient ID: Samantha [name], female    DOB: [birth year], 56 y.o.   MRN: [id number]  HPI cc: right elbow pain and left achilles pain  Left achilles tendinitis- has been a chronic problem for her but worsening over past few months. Last seen for this in December. She is on her feet a lot for work. Describes the pain as burning, radiates up midway of her calf. Denies numbness or tingling in foot. This area will occasionally swell up and become very red and irritated. She has tried nitro patches in past with some relief but the adhesive irritated her skin. She has also taken diclofenac tabs but this caused spotting- she has seen gynecologist for this and stopped taking this medication. She occasionally takes advil if the pain is severe. She has done home exercises (heel drop) but this was causing sharp pain so she discontinued.  Right elbow pain- also chronic problem for her, she has had both lateral and medial epicondylitis in the past. Her pain now is laterally. Uses brace with improvement in pain. Has tried topical voltaren gel in the past with minimal relief. Endorses occasional tingling in thumb on right side and over her wrist. Endorses some muscle weakness. No neck pain.   PMH- right heel spur, plantar fasciitis, hypothyroidism, cervical DJD, hx of right olecranon bursitis  Meds- hormone patch, tirosint  Review of Systems- no neck pain or low back pain     Objective:   Physical Exam  Well nourished, well appearing, in NAD  Left achilles- appears enlarged, nodule present. No swelling, no erythema. Tender to palpation. No calf tenderness or swelling noted. Full ROM, minor pain with dorsiflexion. Ankle joint stable. Neurovascularly in tact.  Right elbow- no swelling. Pain with palpation over lateral epicondyle. Normal extension and flexion elbow, decreased supination without pain. Neurovascularly in tact.  Ultrasound of Left Achilles Tendon  Nodule about 4 to 6 cms above  calcaneus AP measure of 1.1 cm compares to chronic smaller nodule on Right that measures 0.7 cms Transverse view shows multiple hypoechoic splits in tendons in tendon Multiple neovessels noted  Assessment: nodular chronic Achilles tendinopathy with tendon thickening,   Ultrasound and interpretation by Wolfgang Phoenix. Fields, MD  Assessment & Plan:   Left Achilles tendinitis- chronic, worsening. Now with evidence of tendon splitting on [place] -rx given for nitro patches to apply daily, patient advised to take a 1-2 day break if skin irritation occurs -modified exercises given for patient to do -patient to follow up for orthotics -NSAIDS as needed  Right lateral epicondylitis- chronic, exacerbated by work activities -continue compression sleeve -apply nitro patch as tolerated to affected area -continue exercises as she has been doing -add rolling massage component

## 2016-10-23 NOTE — Patient Instructions (Addendum)
Nitroglycerin Protocol   Apply 1/4 nitroglycerin patch to affected area daily.  Change position of patch within the affected area every 24 hours.  You may experience a headache during the first 1-2 weeks of using the patch, these should subside.  If you experience headaches after beginning nitroglycerin patch treatment, you may take your preferred over the counter pain reliever.  Another side effect of the nitroglycerin patch is skin irritation or rash related to patch adhesive.  Please notify our office if you develop more severe headaches or rash, and stop the patch.  Tendon healing with nitroglycerin patch may require 12 to 24 weeks depending on the extent of injury.  Men should not use if taking Viagra, Cialis, or Levitra.   Do not use if you have migraines or rosacea.   At home exercises: Please do 3 sets each of the following- do as many reps as you can. Seated heel raises Standing heel raises Bent knee heel raises  Ice liberally Aleve/ibuprofen/advil as needed   Elbow- apply 1/4 patch to this area as well, continue brace. Continue exercises. Try rolling "spiky ball" over area to loosen adhesions.

## 2016-10-30 ENCOUNTER — Encounter: Payer: Self-pay | Admitting: Sports Medicine

## 2016-11-11 ENCOUNTER — Ambulatory Visit (INDEPENDENT_AMBULATORY_CARE_PROVIDER_SITE_OTHER): Payer: BLUE CROSS/BLUE SHIELD | Admitting: Sports Medicine

## 2016-11-11 DIAGNOSIS — M7662 Achilles tendinitis, left leg: Secondary | ICD-10-CM

## 2016-11-11 DIAGNOSIS — M722 Plantar fascial fibromatosis: Secondary | ICD-10-CM | POA: Diagnosis not present

## 2016-11-11 NOTE — Progress Notes (Signed)
CC: Chronic Left Achilles Pain  Patient has nodular tendinoopathy on left Painful and difficult to walk or stand for long Advised to RTC today for custom orthotics to see if we can give her more support and help this heal  Starting to get skin irritation with NTG Does seem to help the pain  Past Hx DDD cervical spine DJD lumbar spine  ROS RT elbow pain somewhat better Left AT pain not changed Mild swelling back of left heel  PE Pleasant F in NAD BP 110/60   Ht 5' 7.5" (1.715 m)   Wt 171 lb (77.6 kg)   BMI 26.39 kg/m   Large nodule left AT Redness and some skin blistering over AT  Long Arch is dropped moderately bilat Pronated foot position standing Gait with mild pronation  Patient was fitted for a : standard, cushioned, semi-rigid orthotic. The orthotic was heated and afterward the patient stood on the orthotic blank positioned on the orthotic stand. The patient was positioned in subtalar neutral position and 10 degrees of ankle dorsiflexion in a weight bearing stance. After completion of molding, a stable base was applied to the orthotic blank. The blank was ground to a stable position for weight bearing. Size: 8 red EVA Base: Blue medium density  Posting: none Additional orthotic padding: none  Post completion of orthtotic we were able to get her to a neutral gain with no pronation; Patient felt excellent comfort.

## 2016-11-11 NOTE — Assessment & Plan Note (Signed)
Hopefully custom orthotics will make this likely to recur  Use in work shoes

## 2016-11-11 NOTE — Assessment & Plan Note (Signed)
I think the gait with pronation was contributing to the AT injury and probably to the prior PF issues  Trail on ORthotics  Stop NTG 2/2 skin issues  Reck 4 wks

## 2016-12-09 ENCOUNTER — Ambulatory Visit (INDEPENDENT_AMBULATORY_CARE_PROVIDER_SITE_OTHER): Payer: BLUE CROSS/BLUE SHIELD | Admitting: Sports Medicine

## 2016-12-09 VITALS — BP 120/78

## 2016-12-09 DIAGNOSIS — M7662 Achilles tendinitis, left leg: Secondary | ICD-10-CM | POA: Diagnosis not present

## 2016-12-09 DIAGNOSIS — M7711 Lateral epicondylitis, right elbow: Secondary | ICD-10-CM

## 2016-12-09 MED ORDER — DICLOFENAC SODIUM 1 % TD GEL: 2.0000 g | Freq: Four times a day (QID) | TRANSDERMAL | 1 refills | Status: DC

## 2016-12-09 MED ORDER — DICLOFENAC SODIUM 1 % TD GEL
2.0000 g | Freq: Four times a day (QID) | TRANSDERMAL | 1 refills | Status: DC
Start: 2016-12-09 — End: 2016-12-09

## 2016-12-09 NOTE — Assessment & Plan Note (Signed)
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

## 2016-12-09 NOTE — Progress Notes (Signed)
Chief complaint: Acute on chronic Left Achilles pain 3 months, acute on chronic right elbow pain 3 months  History of present illness: Samantha [name] is a 56 year old female who presents to the sports medicine office today for follow-up of her left Achilles tendinosis as well as her right lateral epicondylitis today. She was last here in the office back on 10/23/16.  In regards to her left Achilles tendinosis, she does report of interval improvement in her symptoms. She reports that since orthotics were made last month she noted interval improvement. She reports that she was not able to take the nitroglycerin patch, reports that she started to get skin irritation and blisters with it. She reports that she has been doing heel raise exercises with her heel. She reports minimal pain today, only having pain with forced dorsiflexion. She does not report of any warmth, erythema, ecchymosis, or effusion. She does not report of any numbness, tingling, or weakness in her left leg. She reports that she only takes Advil occasionally.   She was previously prescribed diclofenac, however, she reports that "it messed with my hormones", started having vaginal spotting, was told by her gynecologist to stop taking this medication. She did report the diclofenac helping with her symptoms though.  In regards to her right elbow pain, she reports of minimal improvement in her symptoms. She reports that this has been an ongoing issue for her. 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 She reports any type of elbow extension and supination are aggravating factors. Similar to above, she has tried the occasional Advil, with minimal relief in symptoms. Is not able to take nitroglycerin due to skin side effects. Not report of any weakness in grip strength. She is right hand dominant.   Interval past medical history, surgical history, family history, and social history reviewed, unchanged.  Review of  systems:  As stated above  Physical exam: Vital signs are reviewed and are documented in the chart Gen.: Alert, oriented, appears stated age, in no apparent distress HEENT: Moist oral mucosa Respiratory: Normal respirations, able to speak in full sentences Cardiac: Regular rate, distal pulses 2+ Integumentary: No rashes on visible skin:  Neurologic: Strength 5/5, sensation 2+ bilateral upper extremities and lower extremities Psych: Normal affect, mood is described as good Musculoskeletal: Inspection of left foot and ankle reveals slight increase in size of her left Achilles tendon over posterior aspect 4 cms above insertion, but otherwise no muscular atrophy or deformity, no warmth, erythema, ecchymosis, or effusion noted, no tenderness to palpation along the Achilles tendon, retrocalcaneal bursa, and posterior calcaneus, has only very minimal pain with forced dorsiflexion, otherwise no pain elicited with passive or active ankle range of motion;   inspection of right elbow reveals no obvious deformity or muscular atrophy, no warmth, erythema, ecchymosis, or effusion noted, she is tender to palpation along the lateral epicondyle and extensor tendons, pain elicited with active supination and extension, she otherwise has full passive and active elbow range of motion  Limited musculoskeletal ultrasound: Left Achilles Ultrasound was done today of her left Achilles tendon. Ultrasound does show continued hypoechoic changes seen in the midportion of her Achilles tendon, but healing is seen, with evidence of decrease in Achilles tendon size, going down to 1.06 cm compared to 1.5 cm. At level of nodule about 5 cms above insertion on AP view.  Color Doppler does shows mild continued neovascularization, but not as noticeable as with ultrasound evaluation back on 10/23/16.  Ultrasound  of her right lateral elbow was performed today, limited evaluation included visualizing her right lateral epicondyle and  extensor tendons, she does have slight cortical irregularity and calcification on the proximal aspect of the extensor tendon, slight hypoechogenicity seen on the proximal aspect of her extensor tendons. Color Doppler view does show very active neovascularization.  Impression: 1. Healing left Achilles tendinosis 2. Right lateral epicondylitis, with common extensor tendinosis  Ultrasound and interpretation by Wolfgang Phoenix. Fields, MD    Assessment and plan: 1. Left Achilles tendinosis 2. Right lateral epicondylitis  Left Achilles tendinosis -Discussed with patient that she does have interval improvement, with evidence of decrease in size of the left Achilles tendon as seen on ultrasound today -Discussed continue with home exercise program to include eccentric strengthening -She does report of interval improvement with diclofenac, however, she reports that the PO diclofenac caused gynecologic side effects -Will send in for topical Voltaren to use on affected area 4 times daily  Right lateral epicondylitis -She still has irritation along the right lateral epicondylitis in common extensor tendons, as seen on ultrasound today -As noted above, not able to take nitroglycerin patch due to skin side effects, not able to take oral diclofenac, discussed the use topical full tear. For this as well, apply to affected area 4 times daily -Discuss home exercise program to include strengthening, specifically with elbow extension and supination  Will have patient follow-up in 2-3 months, with repeat ultrasound at that time or sooner as needed.   Mort Sawyers, M.D. [facility] [place]  I observed and examined the patient with the resident and agree with assessment and plan.  Note reviewed and modified by me. Stefanie Libel, MD

## 2016-12-09 NOTE — Assessment & Plan Note (Signed)
See plan  Improved with orthotics  Add topical voltaren  Too much skin irritation to use NTG  HEP

## 2017-01-20 DIAGNOSIS — E039 Hypothyroidism, unspecified: Secondary | ICD-10-CM | POA: Diagnosis not present

## 2017-02-03 DIAGNOSIS — E039 Hypothyroidism, unspecified: Secondary | ICD-10-CM | POA: Diagnosis not present

## 2017-02-10 DIAGNOSIS — H5319 Other subjective visual disturbances: Secondary | ICD-10-CM | POA: Diagnosis not present

## 2017-02-17 DIAGNOSIS — N950 Postmenopausal bleeding: Secondary | ICD-10-CM | POA: Diagnosis not present

## 2017-03-10 ENCOUNTER — Encounter: Payer: Self-pay | Admitting: Sports Medicine

## 2017-03-10 ENCOUNTER — Ambulatory Visit: Payer: Self-pay

## 2017-03-10 ENCOUNTER — Ambulatory Visit: Payer: BLUE CROSS/BLUE SHIELD | Admitting: Sports Medicine

## 2017-03-10 DIAGNOSIS — M7711 Lateral epicondylitis, right elbow: Secondary | ICD-10-CM | POA: Diagnosis not present

## 2017-03-10 DIAGNOSIS — M7662 Achilles tendinitis, left leg: Secondary | ICD-10-CM | POA: Diagnosis not present

## 2017-03-10 MED ORDER — METHYLPREDNISOLONE ACETATE 40 MG/ML IJ SUSP
40.0000 mg | Freq: Once | INTRAMUSCULAR | Status: AC
  Administered 2017-03-10: 40 mg via INTRA_ARTICULAR

## 2017-03-10 NOTE — Progress Notes (Signed)
Chief complaint: Follow-up of left Achilles pain and right elbow pain  History of present illness: Samantha [name] is a 56 year old female who presents to sports medicine office today for follow-up of her left Achilles pain as well as right elbow pain.  In regards to her left Achilles pain, she does have known diagnosis of midsubstance Achilles tendinopathy. Unfortunately, she is not able to use nitroglycerin patch due to side effect of rash. She reports that she has mainly been doing stretching and eccentric exercises at home. She reports same amount of pain today. She describes the pain as a 4/10, nonradiating, describes it as throbbing. She does not report of any interval injury or trauma. She reports noticing this when she does a lot of walking. She also feels a pulling and throbbing pain in the medial aspect of her right distal calf muscles.  In regards to her right elbow pain, she does have known diagnosis of right lateral epicondylitis. She reports same amount of pain today. She points to the lateral epicondyle at the myotendinous junction of the elbow extensor muscles as to where her pain is the most excruciating at. she reports any type of supination and elbow extension are aggravating factors. She does report feeling symptoms of numbness, tingling and burning pain at nighttime over the dorsal aspect of her right forearm and into her fourth and fifth digits on the right side. She does not report of any weakness in grip strength. She is right hand dominant. 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  Review of systems:  As stated above  Interval past medical history, surgical history, family history, and social history obtained and unchanged.  Physical exam: Vital signs are reviewed and are documented in the chart Gen.: Alert, oriented, appears stated age, in no apparent distress HEENT: Moist oral mucosa Respiratory: Normal respirations, able to speak in full sentences Cardiac: Regular rate,  distal pulses 2+ Integumentary: No rashes on visible skin:  Neurologic: Strength 5/5, sensation 2+ in bilateral upper and lower extremities Psych: Normal affect, mood is described as good Musculoskeletal: Inspection of right elbow reveals no obvious deformity or muscle atrophy, no warmth, erythema, ecchymosis, or effusion, she is tender to palpation essentially at the myotendinous junction of the common elbow extensor tendons to where it inserts into the lateral epicondyle, do appreciate just very subtle fullness in the right elbow compared to the left elbow, she does have full range of motion in her elbow, pain with supination as well as elbow flexion, inspection of her left Achilles tendon reveals no obvious deformity or muscle atrophy, no warmth, erythema, ecchymosis, or effusion, she does have redemonstration of Achilles tendon nodule, which is slightly tender to palpation, no antalgic gait with ambulation, she does have full range of motion in her ankle without any elicitation of pain, normal Achilles and posterior tibialis tendon strength otherwise  Procedure: Injection of RT lateral epicondyle Consent obtained and verified. Time-out conducted. Noted no overlying erythema, induration, or other signs of local infection. Skin prepped in a sterile fashion. Topical analgesic spray: Ethyl chloride. Completed without difficulty. I injected at maximum point of tenderness over the common elbow extendors just distally to the lateral epicondyle and into parameniscal cyst Meds: 1 cc depomedrol 40 and 1.5 cc lidocaine 1% Pain immediately improved suggesting accurate placement of the medication. Advised to call if fevers/chills, erythema, induration, drainage, or persistent bleeding.  Assessment and plan: 1. Left midsubstance Achilles tendinopathy 2. Right lateral epicondylitis, question radial nerve impingement  and development of Wartenberg syndrome  Plan: Discussed with [place] in regards to pain  management be started on Arnica gel for her Achilles tendon. Discussed with her that this has properties that are similar to nitroglycerin without the side effects. Also discuss to have her continue with eccentric strengthening exercises. Did provide a 3/16 heel left to her current orthotics on both sides to help out with relieving tension over the Achilles tendon. Also had her fitted in body helix ankle brace. In regards to the right elbow pain, given continued chronic pain, discussed next best step would be cortisone injection to the right lateral epicondyle. She is agreeable to this. It was done as noted above without any complications noted. Discussed no heavy (no more than 5-10 pounds) lifting, pushing, pulling for the next week. We'll have her follow-up in 6-8 weeks or sooner as needed.    Mort Sawyers, M.D. [place]

## 2017-03-17 ENCOUNTER — Encounter (INDEPENDENT_AMBULATORY_CARE_PROVIDER_SITE_OTHER): Payer: Self-pay

## 2017-04-17 ENCOUNTER — Encounter (INDEPENDENT_AMBULATORY_CARE_PROVIDER_SITE_OTHER): Payer: Self-pay

## 2017-04-21 ENCOUNTER — Other Ambulatory Visit: Payer: BLUE CROSS/BLUE SHIELD | Admitting: Sports Medicine

## 2017-05-05 ENCOUNTER — Encounter: Payer: Self-pay | Admitting: Sports Medicine

## 2017-05-05 ENCOUNTER — Ambulatory Visit: Payer: BLUE CROSS/BLUE SHIELD | Admitting: Sports Medicine

## 2017-05-05 ENCOUNTER — Ambulatory Visit: Payer: Self-pay

## 2017-05-05 VITALS — BP 122/61 | Ht 67.5 in | Wt 172.0 lb

## 2017-05-05 DIAGNOSIS — M7662 Achilles tendinitis, left leg: Secondary | ICD-10-CM

## 2017-05-05 DIAGNOSIS — M7711 Lateral epicondylitis, right elbow: Secondary | ICD-10-CM

## 2017-05-05 MED ORDER — NORTRIPTYLINE HCL 25 MG PO CAPS: 25.0000 mg | ORAL_CAPSULE | Freq: Every day | ORAL | 1 refills | Status: DC

## 2017-05-05 NOTE — Progress Notes (Addendum)
Chief complaint: Follow up of chronic (greater than 1 year) left Achilles pain and right elbow pain  History of present illness: Samantha [name] is a 57 year old female who presents to the sports medicine office today for follow-up of her left Achilles pain as well as her right elbow pain. Both of the above have been a chronic issue for her, greater than 1 year.  At last office appointment back on 03/10/17, discussed with her about her recurring pain with her left Achilles and right elbow. She does have known history of left midsubstance Achilles tendonopathy and previous history of right lateral epicondylitis. In regards to the Achilles tendinopathy, discussed last time to have her start using body helix ankle brace to see if this helps. In addition, she was given a 3/16 inch heel lift that was applied to her current orthotics on both sides.  She reports that she was unable to use the body helix in her shoes just because it was too tight.  She reports that she is able to use it while wearing sandals.  She reports not noticing much of a difference with the heel lifts.  In brief review, she is not able to use the nitroglycerin patch due to history of rash.  She is not able to use any NSAIDs due to history of abnormal uterine bleeding while on this.  She does not report of any interval injury or trauma.  She reports symptoms have been about the same, describes the pain as a 3-4/10 today. She has not been able to do any type of heel lift secondary to pain. We had also advised her to hold off on this due to pain at last appointment.  In regards to her her right elbow, she had clinical symptoms consistent with right lateral epicondylitis.  Cortisone injection was done at that office visit, which did help with her pain. No pain along the lateral epicondyle today.  She reports mainly having symptoms today along the medial epicondyle on the same side.  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  She does not report of any interval injury or trauma.  Still reports of some numbness and tingling going down the dorsal forearm to her hands and fingers at nighttime.  Review of systems:  As stated above  Interval past medical history, surgical history, family history, and social history obtained and unchanged.  Please refer to EMR.  Physical exam: Vital signs are reviewed and are documented in the chart Gen.: Alert, oriented, appears stated age, in no apparent distress HEENT: Moist oral mucosa Respiratory: Normal respirations, able to speak in full sentences Cardiac: Regular rate, distal pulses 2+ Integumentary: No rashes on visible skin:  Neurologic: Right elbow strength is intact with flexion, extension, pronation, supination; left ankle strength intact with dorsiflexion, plantar flexion, inversion, eversion, sensation 2+ in bilateral upper and lower extremities Psych: Normal affect, mood is described as good Musculoskeletal: Inspection of right elbow reveals no obvious deformity or muscle atrophy, no warmth, erythema, ecchymosis, or effusion, she is tender to palpation today over the medial epicondyle and common flexor tendons, she does have full range of motion in her elbow, she does have full elbow ROM without elicitation of pain; Inspection of her left Achilles tendon reveals no obvious deformity or muscle atrophy, no warmth, erythema, ecchymosis, or effusion, she does have redemonstration of Achilles tendon nodule, which is slightly tender to palpation, pain accenuated with dorsiflexion today, no antalgic gait with ambulation,  she does have full range of motion in her ankle  Limited musculoskeletal ultrasound was performed in the office today of her left Achilles tendon. Key findings from the ultrasound reveal that she has thickened midportion Achilles tendon measuring 1.23 cm, with hypoechoic changes seen in the midbody of the tendon. In addition, there  appears to be neovascularization at the peritendonous junction.  Limited musculoskeletal ultrasound was performed in the office today of her right elbow. Key findings from the ultrasound reveal that she does have slight amount of hypoechoic changes at the proximal common flexor tendons over the medial epicondyle.  Assessment and plan: 1. Left midsubstance Achilles tendinopathy 2. Right medial epicondylitis  Plan: Ultimately, for the right elbow pain that she is having, medication options are limited.   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  Discussed gentle physical therapy exercises for her to do at home with light dumbbell weights with elbow flexion, extension, internal, and external range of motion.  We will have her started on nortriptyline 25 mg nightly and see how this does.  In regards to the Achilles tendinopathy, discussed gentle eccentric exercises to do.  Discussed using Arnica gel to see if this helps.  She has been using Voltaren gel, discussed to have her continue this. Will give this about 6 weeks and see how she does.  She will follow up in the office after that time.  Mort Sawyers, M.D. [place]

## 2017-05-12 ENCOUNTER — Encounter (INDEPENDENT_AMBULATORY_CARE_PROVIDER_SITE_OTHER): Payer: Self-pay

## 2017-06-09 DIAGNOSIS — N950 Postmenopausal bleeding: Secondary | ICD-10-CM | POA: Diagnosis not present

## 2017-06-09 DIAGNOSIS — Z01419 Encounter for gynecological examination (general) (routine) without abnormal findings: Secondary | ICD-10-CM | POA: Diagnosis not present

## 2017-06-09 DIAGNOSIS — Z1231 Encounter for screening mammogram for malignant neoplasm of breast: Secondary | ICD-10-CM | POA: Diagnosis not present

## 2017-06-09 DIAGNOSIS — Z6828 Body mass index (BMI) 28.0-28.9, adult: Secondary | ICD-10-CM | POA: Diagnosis not present

## 2017-06-16 ENCOUNTER — Encounter: Payer: Self-pay | Admitting: Sports Medicine

## 2017-06-16 ENCOUNTER — Ambulatory Visit (INDEPENDENT_AMBULATORY_CARE_PROVIDER_SITE_OTHER): Payer: BLUE CROSS/BLUE SHIELD | Admitting: Sports Medicine

## 2017-06-16 VITALS — BP 120/68 | Ht 67.5 in | Wt 175.0 lb

## 2017-06-16 DIAGNOSIS — M7711 Lateral epicondylitis, right elbow: Secondary | ICD-10-CM

## 2017-06-16 DIAGNOSIS — M7662 Achilles tendinitis, left leg: Secondary | ICD-10-CM

## 2017-06-16 DIAGNOSIS — E039 Hypothyroidism, unspecified: Secondary | ICD-10-CM | POA: Diagnosis not present

## 2017-06-16 NOTE — Progress Notes (Signed)
Chief complaint: Follow-up of chronic left Achilles pain and right elbow pain x greater than one year  History of present illness: Samantha [name] is a 57 year old right hand dominant female who presents to sports medicine office today for follow-up of her left Achilles pain as well as her right elbow pain.  Both of the above have been a chronic issue for her for more than a year now.  She was last seen here about 6 weeks ago back on 05/05/17.  She does have known history of left Achilles tendinosis with mid substance Achilles nodule, as well as right lateral elbow pain consistent with right lateral epicondylitis.  Unfortunately, she has had adverse reactions to all the medications we have given her so far.  Most recently, we had started her on nortriptyline 25 mg nightly.  Unfortunately, she reports having adverse side effects of depression, fatigue, and cold intolerance and she had to discontinue this.  She reports the main issue that she is having right now is continued Achilles pain.  She reports that she would like to do more walking but the pain in her Achilles is preventing her to walk.  She does have custom orthotics that were made for her with a heel lift.  In regards to her right elbow pain, she reports large improvement in elbow pain post injectin but still with local pain and no interval improvement in her symptoms since last visit  She reports of repetitive wrist extension, as well as radial and ulnar deviation of her right wrist causes pain.  No report of any interval injury or trauma.  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  She will occasionally has some numbness and tingling going down from her right shoulder to her right hands and fingers mainly at nighttime.  She has not had any swelling, warmth, or redness in her right elbow.  Review of systems:  As stated above  Interval past medical history, surgical  history, family history, and social history obtained and unchanged. Please refer to EMR.  Physical exam: Vital signs are reviewed and are documented in the chart Gen.: Alert, oriented, appears stated age, in no apparent distress HEENT: Moist oral mucosa Respiratory: Normal respirations, able to speak in full sentences Cardiac: Regular rate, distal pulses 2+ Integumentary: No rashes on visible skin:  Neurologic: Strength 5/5 with right elbow flexion, extension, pronation, and supination, strength also 5/5 with left ankle dorsiflexion, plantarflexion, inversion, and eversion, sensation 2+ in bilateral lower extremities Psych: Normal affect, mood is described as good Musculoskeletal: Inspection of right elbow reveals no obvious deformity or muscle atrophy, no warmth, erythema, ecchymosis, or effusion, she is tender to palpation today over the lateral epicondyle and extensor tendons, she continues to have full strength and range of motion in her elbow without any elicitation of pain; Inspection of her left Achilles tendon reveals no obvious deformity or muscle atrophy, no warmth, erythema, ecchymosis, or effusion, she does have redemonstration of Achilles tendon nodule the same size, which is slightly tender to deep palpation, does have full ankle range of motion and strength, no antalgic gait with ambulation  Assessment and plan: 1. Left midsubstance Achilles tendinopathy 2. Right lateral epicondylitis  Plan: Unfortunately, she had an adverse reaction to nortriptyline.  There aren't many other medication options to give her due to her adverse effects of other medications we have tried thus far.  At this point in time, discussed with Samantha [name] that  the best course of action would be to get her into do formal physical therapy and have them specifically work with her Achilles tendon and ask if iontophoresis could be done to help with her symptoms.  Did offer short prednisone course to see if this can bring  down inflammation, but she opts not to do this as she reports that with oral prednisone she does have adverse reaction of hypotension.  Will also ask for physical therapy to work with her right elbow lateral epicondylitis if they have additional time to do so.  We will plan to see her back in 4 weeks or sooner as needed.   Mort Sawyers, M.D. [facility]   I observed and examined the patient with the [facility] fellow and agree with assessment and plan.  Note reviewed and modified by me. Stefanie Libel, MD

## 2017-06-24 ENCOUNTER — Ambulatory Visit: Payer: BLUE CROSS/BLUE SHIELD | Admitting: Physical Therapy

## 2017-07-07 ENCOUNTER — Encounter: Payer: Self-pay | Admitting: Physical Therapy

## 2017-07-07 ENCOUNTER — Ambulatory Visit: Payer: BLUE CROSS/BLUE SHIELD | Attending: Internal Medicine | Admitting: Physical Therapy

## 2017-07-07 DIAGNOSIS — M25572 Pain in left ankle and joints of left foot: Secondary | ICD-10-CM | POA: Diagnosis not present

## 2017-07-07 DIAGNOSIS — R600 Localized edema: Secondary | ICD-10-CM

## 2017-07-07 DIAGNOSIS — M6281 Muscle weakness (generalized): Secondary | ICD-10-CM | POA: Diagnosis not present

## 2017-07-07 DIAGNOSIS — M79662 Pain in left lower leg: Secondary | ICD-10-CM | POA: Diagnosis not present

## 2017-07-07 NOTE — Therapy (Signed)
[place], [place], [address] Phone: [phone]   Fax:  [phone]  Physical Therapy Evaluation  Patient Details  Name: Samantha [name] MRN: [id number] Date of Birth: Jan 24, 1961 Referring Provider: Dr. Oneida Alar   Encounter Date: 07/07/2017  PT End of Session - 07/07/17 1610    Visit Number  1    Number of Visits  12    Date for PT Re-Evaluation  08/18/17    PT Start Time  1500    PT Stop Time  1546    PT Time Calculation (min)  46 min    Activity Tolerance  Patient tolerated treatment well    Behavior During Therapy  [facility] for tasks assessed/performed       Past Medical History:  Diagnosis Date  . Allergy   . Depression   . GERD (gastroesophageal reflux disease)   . Thyroid disease     History reviewed. No pertinent surgical history.  There were no vitals filed for this visit.   Subjective Assessment - 07/07/17 1505    Subjective  Pt presents with L Achilles pain which has been ongoing for about a year.  She has difficulty with walking, stairs, work activities.  She likes to walk for fitness and she has been unable to do so.  She has stopped doing stretches as they may have been too intense.  She wears Body Helix ankle brace, icing but with limited effect.     Pertinent History  similar issue on Rt. LE (Achilles ) which has resolved, Plantar fasciitis bilat. ,     Limitations  Standing;Walking;Lifting    How long can you stand comfortably?  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  Yes    Pain Score  4     Pain Location  Ankle    Pain Orientation  Left;Posterior    Pain Descriptors / Indicators  Sore;Aching    Pain Type  Chronic pain    Pain Radiating Towards  becomes more proximal with activity     Pain Onset  More  than a month ago    Pain Frequency  Intermittent    Aggravating Factors   weightbearing , overactivity     Pain Relieving Factors  ice, Helix , orthotics     Effect of Pain on Daily Activities  limits mobility     Multiple Pain Sites  No         [facility] PT Assessment - 07/07/17 0001      Assessment   Medical Diagnosis  L achillles     Referring Provider  Dr. Oneida Alar    Onset Date/Surgical Date  09/06/17 approx.    Next MD Visit  4 weeks     Prior Therapy  No, but did ther ex for Rt. LE and it improved       Precautions   Precautions  None    Precaution Comments  had mild allergy to the nitro patch       Restrictions   Weight Bearing Restrictions  No      Balance Screen   Has the patient fallen in the past 6 months  No      [place] residence  Living Arrangements  Spouse/significant other    Type of Home  House      Prior Function   Level of Independence  Independent    Vocation  Part time employment cleans houses 3 d per week     Vocation Requirements  bending, squatting, walking up and down stairs     Leisure  walking, gardening       Cognition   Overall Cognitive Status  Within Functional Limits for tasks assessed      Observation/Other Assessments   Focus on Therapeutic Outcomes (FOTO)   50%      Figure 8 Edema   Figure 8 - Right   18 7/8 inch     Figure 8 - Left   18 7/8 inch       Sensation   Light Touch  Appears Intact      Squat   Comments  L heel comes up       Single Leg Stance   Comments  increased effort L side, 20-30 sec       Posture/Postural Control   Posture/Postural Control  No significant limitations    Posture Comments  L Achilles somewhat thicker viewed posteriorly      AROM   Overall AROM Comments  inversion and eversion WNL     Right Ankle Dorsiflexion  13    Right Ankle Plantar Flexion  58    Left Ankle Dorsiflexion  15    Left Ankle Plantar Flexion  56      Strength   Overall Strength  Comments  WNL x L plantaflexion 4/5 tested in SLS , needed UE assist, lacks control       Palpation   Palpation comment  visible , palpable nodule along L Achilles tendon, sore, tender      Transfers   Comments  WNL       Ambulation/Gait   Gait Comments  WNL        Objective measurements completed on examination: See above findings.      [facility] Adult PT Treatment/Exercise - 07/07/17 0001      Self-Care   Self-Care  Heat/Ice Application;Other Self-Care Comments    Heat/Ice Application  ice post work , activity     Other Self-Care Comments   tennis ball for self mobilization, HEP , PT.,POC       Ankle Exercises: Stretches   Plantar Fascia Stretch  2 reps    Soleus Stretch  2 reps    Gastroc Stretch  2 reps    Other Stretch  L soleus tighter than Rt.              PT Education - 07/07/17 1610    Education provided  Yes    Education Details  PT/POC, HEP , RICE, self care , ionto     Person(s) Educated  Patient    Methods  Explanation;Demonstration;Handout    Comprehension  Verbalized understanding;Returned demonstration          PT Long Term Goals - 07/07/17 1610      PT LONG TERM GOAL #1   Title  Pt will be able to score less than 37% on FOTO to demo functional improvement in LE, mobility.     Time  6    Period  Weeks    Status  New    Target Date  08/18/17      PT LONG TERM GOAL #2   Title  Pt will be able to perform 15 consecutive  heel raises on LLE with no UE assist to demo incr strength     Time  6    Period  Weeks    Status  New    Target Date  08/18/17      PT LONG TERM GOAL #3   Title  Pt will be able to work 3 hours and have pain < 2/10 most of the time.     Time  6    Period  Weeks    Status  New    Target Date  08/18/17      PT LONG TERM GOAL #4   Title  Pt will be able to walk her dog for 30 min with no increase in pain in LE.     Time  6    Period  Weeks    Status  New    Target Date  08/18/17             Plan - 07/07/17  1613    Clinical Impression Statement  Pt presents for low complexity eval of L Achilles tendinopathy with subsequent pain, soft tissue restriction (calf, ankle, plantar fascia) , min weakness and lack of eccentric control.  She should do well with corrective exercise progression and modalities.      Clinical Presentation  Stable    Clinical Decision Making  Low    Rehab Potential  Excellent    PT Frequency  2x / week    PT Duration  6 weeks    PT Treatment/Interventions  ADLs/Self Care Home Management;Cryotherapy;Therapeutic exercise;Taping;Manual techniques;Neuromuscular re-education;Balance training;Ultrasound;Iontophoresis 4mg /ml Dexamethasone;Moist Heat;Dry needling    PT Next Visit Plan  ionto, IASTM, tape and progressive strength, flexibility (caution with eccentrics), consider dry needling     PT Home Exercise Plan  gastroc/soleus stretching, tennis ball for Plantar fascia    Consulted and Agree with Plan of Care  Patient       Patient will benefit from skilled therapeutic intervention in order to improve the following deficits and impairments:  Decreased balance, Increased fascial restricitons, Pain, Decreased strength, Increased edema, Difficulty walking  Visit Diagnosis: Pain in left ankle and joints of left foot  Pain in left lower leg  Muscle weakness (generalized)  Localized edema     Problem List Patient Active Problem List   Diagnosis Date Noted  . Inflammatory heel pain 02/22/2016  . Osteoarthritis, hand 02/15/2015  . Lateral epicondylitis of right elbow 01/03/2015  . Cervical radiculopathy at C8 03/21/2014  . Degenerative disc disease, cervical 08/18/2013  . Degenerative arthritis of lumbar spine 08/18/2013  . Achilles tendinitis 01/06/2012  . HYPOTHYROIDISM 03/15/2009  . Plantar fasciitis, right 03/15/2009  . CAVUS DEFORMITY OF FOOT, ACQUIRED 03/15/2009    Yates Weisgerber 07/07/2017, 4:18 PM  [facility] [address] [place], [place], [address] Phone: [phone]   Fax:  [phone]  Name: Samantha [name] MRN: [id number] Date of Birth: Oct 20, 1960   Raeford Razor, PT 07/07/17 4:19 PM Phone: [phone] Fax: [phone]

## 2017-07-07 NOTE — Patient Instructions (Addendum)

## 2017-07-13 ENCOUNTER — Ambulatory Visit: Payer: BLUE CROSS/BLUE SHIELD | Admitting: Physical Therapy

## 2017-07-15 ENCOUNTER — Encounter: Payer: BLUE CROSS/BLUE SHIELD | Admitting: Physical Therapy

## 2017-07-20 ENCOUNTER — Ambulatory Visit: Payer: BLUE CROSS/BLUE SHIELD | Attending: Internal Medicine | Admitting: Physical Therapy

## 2017-07-20 ENCOUNTER — Encounter: Payer: Self-pay | Admitting: Physical Therapy

## 2017-07-20 DIAGNOSIS — R600 Localized edema: Secondary | ICD-10-CM | POA: Diagnosis not present

## 2017-07-20 DIAGNOSIS — M6281 Muscle weakness (generalized): Secondary | ICD-10-CM | POA: Diagnosis not present

## 2017-07-20 DIAGNOSIS — M25572 Pain in left ankle and joints of left foot: Secondary | ICD-10-CM | POA: Insufficient documentation

## 2017-07-20 DIAGNOSIS — M79662 Pain in left lower leg: Secondary | ICD-10-CM

## 2017-07-21 ENCOUNTER — Encounter: Payer: BLUE CROSS/BLUE SHIELD | Admitting: Physical Therapy

## 2017-07-21 NOTE — Therapy (Addendum)
[place], [place], [address] Phone: [phone]   Fax:  [phone]  Physical Therapy Treatment  Patient Details  Name: Samantha [name] MRN: [id number] Date of Birth: Aug 14, 1960 Referring Provider: Dr. Oneida Alar   Encounter Date: 07/20/2017  PT End of Session - 07/21/17 0825    Visit Number  1    Number of Visits  12    Date for PT Re-Evaluation  08/18/17    PT Start Time  1545    PT Stop Time  1625    PT Time Calculation (min)  40 min    Activity Tolerance  Patient tolerated treatment well    Behavior During Therapy  [facility] for tasks assessed/performed       Past Medical History:  Diagnosis Date  . Allergy   . Depression   . GERD (gastroesophageal reflux disease)   . Thyroid disease     History reviewed. No pertinent surgical history.  There were no vitals filed for this visit.  Subjective Assessment - 07/21/17 0819    Subjective  Patient feels like the exercises have been helpoing. She is having very little pain today. she has not tried any of the tasks that made her painful.     Pertinent History  similar issue on Rt. LE (Achilles ) which has resolved, Plantar fasciitis bilat. ,     Limitations  Standing;Walking;Lifting    How long can you stand comfortably?  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  Yes    Pain Score  1     Pain Location  Ankle    Pain Orientation  Left;Posterior;Medial    Pain Descriptors / Indicators  Aching;Sore    Pain Type  Chronic pain    Pain Onset  More than a month ago    Pain Frequency  Intermittent    Aggravating Factors   weightbearing     Pain Relieving Factors  ice, body helix     Effect of Pain on Daily Activities  limits mobility                        [facility] Adult  PT Treatment/Exercise - 07/21/17 0001      Knee/Hip Exercises: Standing   Forward Step Up  Step Height: 4";2 sets;10 reps;Limitations    Forward Step Up Limitations  cuing to step and hold     Other Standing Knee Exercises  weight shift onto the ball of the foot 2x10; slow march 3x10;          Soft tissue mobilization      Soft tissue mobilization  IASTM to Achilles , plantar fascia and into distal calf; transverse friction to distal achilles :  Addendum 09/08/2017                   PT Education - 07/21/17 3557    Education provided  Yes    Education Details  updated HEP     Person(s) Educated  Patient    Methods  Explanation    Comprehension  Verbalized understanding;Returned demonstration;Verbal cues required;Tactile cues required          PT Long Term Goals - 07/21/17 3220      PT  LONG TERM GOAL #1   Title  Pt will be able to score less than 37% on FOTO to demo functional improvement in LE, mobility.     Time  6    Period  Weeks    Status  On-going      PT LONG TERM GOAL #2   Title  Pt will be able to perform 15 consecutive heel raises on LLE with no UE assist to demo incr strength     Time  6    Period  Weeks    Status  On-going      PT LONG TERM GOAL #3   Title  Pt will be able to work 3 hours and have pain < 2/10 most of the time.     Time  6    Period  Weeks    Status  On-going      PT LONG TERM GOAL #4   Title  Pt will be able to walk her dog for 30 min with no increase in pain in LE.     Time  6    Period  Weeks    Status  On-going            Plan - 07/21/17 7262    Clinical Impression Statement  [employer] assessed patients gatroc and soleus ofr trigger points. she only had tendenress aorund the medial achillies into its insertion. She tolerated IASTYM well. Therapy gave her forward weight shiftting and slow marching to do at home to continue to work on stability. Continue to assedss for trigger points.     Clinical Presentation  Stable     Clinical Decision Making  Low    Rehab Potential  Excellent    PT Frequency  2x / week    PT Duration  6 weeks    PT Treatment/Interventions  ADLs/Self Care Home Management;Cryotherapy;Therapeutic exercise;Taping;Manual techniques;Neuromuscular re-education;Balance training;Ultrasound;Iontophoresis 4mg /ml Dexamethasone;Moist Heat;Dry needling    PT Next Visit Plan  ionto, IASTM, tape and progressive strength, flexibility (caution with eccentrics), consider dry needling; ionto did not stick.     PT Home Exercise Plan  gastroc/soleus stretching, tennis ball for Plantar fascia    Consulted and Agree with Plan of Care  Patient       Patient will benefit from skilled therapeutic intervention in order to improve the following deficits and impairments:  Decreased balance, Increased fascial restricitons, Pain, Decreased strength, Increased edema, Difficulty walking  Visit Diagnosis: Pain in left ankle and joints of left foot  Pain in left lower leg  Muscle weakness (generalized)  Localized edema     Problem List Patient Active Problem List   Diagnosis Date Noted  . Inflammatory heel pain 02/22/2016  . Osteoarthritis, hand 02/15/2015  . Lateral epicondylitis of right elbow 01/03/2015  . Cervical radiculopathy at C8 03/21/2014  . Degenerative disc disease, cervical 08/18/2013  . Degenerative arthritis of lumbar spine 08/18/2013  . Achilles tendinitis 01/06/2012  . HYPOTHYROIDISM 03/15/2009  . Plantar fasciitis, right 03/15/2009  . CAVUS DEFORMITY OF FOOT, ACQUIRED 03/15/2009    Carney Living PT DPT  07/21/2017, 8:31 AM   Cooper Render SPT  07/21/2017   During this treatment session, the therapist was present, participating in and directing the treatment.  [place], [place], [address] Phone: [phone]   Fax:  [phone]  Name: Samantha [name] MRN: [id number] Date of Birth: 24-Jun-1960

## 2017-07-23 ENCOUNTER — Encounter: Payer: Self-pay | Admitting: Sports Medicine

## 2017-07-23 ENCOUNTER — Ambulatory Visit (INDEPENDENT_AMBULATORY_CARE_PROVIDER_SITE_OTHER): Payer: BLUE CROSS/BLUE SHIELD | Admitting: Sports Medicine

## 2017-07-23 DIAGNOSIS — M7662 Achilles tendinitis, left leg: Secondary | ICD-10-CM

## 2017-07-23 DIAGNOSIS — M7711 Lateral epicondylitis, right elbow: Secondary | ICD-10-CM | POA: Diagnosis not present

## 2017-07-23 MED ORDER — DICLOFENAC SODIUM 75 MG PO TBEC: 75.0000 mg | DELAYED_RELEASE_TABLET | Freq: Two times a day (BID) | ORAL | 11 refills | Status: DC | PRN

## 2017-07-23 NOTE — Progress Notes (Signed)
Left Achilles tendon shows a noduleF/U Left AT Patient has some improvement in her left Achilles tendon There is no new swelling There is less tenderness to touch She still gets about the same level of pain if she has to be on her feet too long or if she walks too much She is doing some exercises from physical therapy but has not tried the iontophoresis yet  RT elbow Right elbow feels quite a bit better She feels some of this is because she worked less recently She notes that oral diclofenac and topical diclofenac both help She uses more oral because it seems to help both locations  Review of systems No radicular pain into the right elbow No swelling of the elbow  no numbness in the arm or numbness in the foot  Physical examination Pleasant female in no acute distress BP 120/72   Ht 5' 7.5" (1.715 m)   Wt 175 lb (79.4 kg)   BMI 27.00 kg/m   Left AT shows a nodule 4 to 6 ms above calcaneus This is not TTP Freely movable No swelling  Smaller than last exam  RT elbow Mild hyperextension TTP at distal lateral epicondyle Mild pain with resisted finger or wrist extension

## 2017-07-23 NOTE — Assessment & Plan Note (Signed)
She has made some gradual but good progress  Much less pain today  Continue on HEP and working with PT  Topical or oral diclofenac

## 2017-07-23 NOTE — Addendum Note (Signed)
Addended by: Jolinda Croak E on: 07/23/2017 03:51 PM   Modules accepted: Orders

## 2017-07-23 NOTE — Addendum Note (Signed)
Addended by: Jolinda Croak E on: 07/23/2017 04:01 PM   Modules accepted: Orders

## 2017-07-23 NOTE — Assessment & Plan Note (Signed)
Nodule is less painful and smaller  Keep up PT exercise  Trial w iontophoresis  Reck 3 mos

## 2017-07-27 ENCOUNTER — Ambulatory Visit: Payer: BLUE CROSS/BLUE SHIELD | Admitting: Physical Therapy

## 2017-07-27 ENCOUNTER — Encounter: Payer: Self-pay | Admitting: Physical Therapy

## 2017-07-27 DIAGNOSIS — M6281 Muscle weakness (generalized): Secondary | ICD-10-CM | POA: Diagnosis not present

## 2017-07-27 DIAGNOSIS — M79662 Pain in left lower leg: Secondary | ICD-10-CM

## 2017-07-27 DIAGNOSIS — M25572 Pain in left ankle and joints of left foot: Secondary | ICD-10-CM | POA: Diagnosis not present

## 2017-07-27 DIAGNOSIS — R600 Localized edema: Secondary | ICD-10-CM

## 2017-07-27 NOTE — Patient Instructions (Signed)
Ankle: Calf Raise    Stand with forefoot on step, heels toward floor. Raise heels as far as possible. Use hand support for balance. Repeat __10__ times per set. Do ___3_ sets per session. Do _4-5___ sessions per week.  Copyright  VHI. All rights reserved.

## 2017-07-27 NOTE — Therapy (Signed)
[name], [place], [address] Phone: [phone]   Fax:  [phone]  Physical Therapy Treatment  Patient Details  Name: Samantha [name] MRN: [id number] Date of Birth: January 27, 1961 Referring Provider: Dr. Oneida Alar   Encounter Date: 07/27/2017  PT End of Session - 07/27/17 1510    Visit Number  3    Number of Visits  12    Date for PT Re-Evaluation  08/18/17    PT Start Time  3818    PT Stop Time  1505    PT Time Calculation (min)  48 min    Activity Tolerance  Patient tolerated treatment well    Behavior During Therapy  [facility] for tasks assessed/performed       Past Medical History:  Diagnosis Date  . Allergy   . Depression   . GERD (gastroesophageal reflux disease)   . Thyroid disease     History reviewed. No pertinent surgical history.  There were no vitals filed for this visit.  Subjective Assessment - 07/27/17 1421    Subjective  Walking today pain is 4/10.      Currently in Pain?  Yes    Pain Score  4     Pain Location  Ankle    Pain Orientation  Left;Posterior;Medial    Pain Descriptors / Indicators  Aching    Pain Type  Chronic pain    Pain Onset  More than a month ago    Pain Frequency  Intermittent            [facility] Adult PT Treatment/Exercise - 07/27/17 0001      Knee/Hip Exercises: Aerobic   Elliptical  5 min L5 ramp, L 1 resist       Knee/Hip Exercises: Standing   Heel Raises  Both;4 sets;15 reps neutral, inversion and eversion with UE assist    Other Standing Knee Exercises  weight shift onto the ball of the foot 1 x10     Other Standing Knee Exercises  eccentric lowering LLE on floor x 15 reps       Ultrasound   Ultrasound Location  L achilles medial     Ultrasound Parameters  50% , 1.0 MHz, 1.0 W/cm2     Ultrasound Goals  Pain      Iontophoresis   Type of Iontophoresis  Dexamethasone    Location  L achilles med    Dose  1 cc     Time  6 hr       Manual Therapy   Manual Therapy  Soft tissue mobilization    Soft tissue mobilization  IASTM to Achilles , plantar fascia and into distal calf       Ankle Exercises: Stretches   Slant Board Stretch  3 reps;30 seconds             PT Education - 07/27/17 1508    Education provided  Yes    Education Details  ionto, progressed HEP , [place]     Person(s) Educated  Patient    Methods  Explanation;Handout;Demonstration;Verbal cues    Comprehension  Verbalized understanding          PT Long Term Goals - 07/27/17 1509      PT LONG TERM GOAL #1   Title  Pt will be able to score less than 37% on FOTO to demo functional improvement in LE, mobility.     Status  Unable to assess      PT LONG TERM GOAL #  2   Title  Pt will be able to perform 15 consecutive heel raises on LLE with no UE assist to demo incr strength     Status  On-going      PT LONG TERM GOAL #3   Title  Pt will be able to work 3 hours and have pain < 2/10 most of the time.     Status  On-going      PT LONG TERM GOAL #4   Title  Pt will be able to walk her dog for 30 min with no increase in pain in LE.     Status  On-going            Plan - 07/27/17 1514    Clinical Impression Statement  Patient did well today, decreased SLS balance on LLE.  Added ultrasound, repeated ionto patch.  In house machine not here, per report is out for repair.  Last patch did not stay on well but she did alot of walking post.      PT Treatment/Interventions  ADLs/Self Care Home Management;Cryotherapy;Therapeutic exercise;Taping;Manual techniques;Neuromuscular re-education;Balance training;Ultrasound;Iontophoresis 4mg /ml Dexamethasone;Moist Heat;Dry needling    PT Next Visit Plan  ionto, IASTM, tape and progressive strength, flexibility (caution with eccentrics), consider dry needling;     PT Home Exercise Plan  gastroc/soleus stretching, tennis ball for Plantar fascia, heel raises, march , wgt shift to ball of foot , step stretch     Consulted and Agree  with Plan of Care  Patient       Patient will benefit from skilled therapeutic intervention in order to improve the following deficits and impairments:  Decreased balance, Increased fascial restricitons, Pain, Decreased strength, Increased edema, Difficulty walking  Visit Diagnosis: Pain in left ankle and joints of left foot  Pain in left lower leg  Muscle weakness (generalized)  Localized edema     Problem List Patient Active Problem List   Diagnosis Date Noted  . Inflammatory heel pain 02/22/2016  . Osteoarthritis, hand 02/15/2015  . Lateral epicondylitis of right elbow 01/03/2015  . Cervical radiculopathy at C8 03/21/2014  . Degenerative disc disease, cervical 08/18/2013  . Degenerative arthritis of lumbar spine 08/18/2013  . Achilles tendinitis 01/06/2012  . HYPOTHYROIDISM 03/15/2009  . Plantar fasciitis, right 03/15/2009  . CAVUS DEFORMITY OF FOOT, ACQUIRED 03/15/2009    PAA,JENNIFER 07/27/2017, 3:18 PM  [facility] [address] [place], [place], [address] Phone: [phone]   Fax:  [phone]  Name: Samantha [name] MRN: [id number] Date of Birth: 06-22-1960   Raeford Razor, PT 07/27/17 3:18 PM Phone: [phone] Fax: [phone]

## 2017-07-29 ENCOUNTER — Ambulatory Visit: Payer: BLUE CROSS/BLUE SHIELD | Admitting: Physical Therapy

## 2017-08-03 ENCOUNTER — Encounter: Payer: BLUE CROSS/BLUE SHIELD | Admitting: Physical Therapy

## 2017-08-05 DIAGNOSIS — Z6828 Body mass index (BMI) 28.0-28.9, adult: Secondary | ICD-10-CM | POA: Diagnosis not present

## 2017-08-05 DIAGNOSIS — L301 Dyshidrosis [pompholyx]: Secondary | ICD-10-CM | POA: Diagnosis not present

## 2017-08-05 DIAGNOSIS — S90519A Abrasion, unspecified ankle, initial encounter: Secondary | ICD-10-CM | POA: Diagnosis not present

## 2017-08-06 ENCOUNTER — Encounter: Payer: Self-pay | Admitting: Physical Therapy

## 2017-08-06 ENCOUNTER — Ambulatory Visit: Payer: BLUE CROSS/BLUE SHIELD | Admitting: Physical Therapy

## 2017-08-06 DIAGNOSIS — M79662 Pain in left lower leg: Secondary | ICD-10-CM

## 2017-08-06 DIAGNOSIS — M6281 Muscle weakness (generalized): Secondary | ICD-10-CM | POA: Diagnosis not present

## 2017-08-06 DIAGNOSIS — R600 Localized edema: Secondary | ICD-10-CM

## 2017-08-06 DIAGNOSIS — M25572 Pain in left ankle and joints of left foot: Secondary | ICD-10-CM

## 2017-08-06 NOTE — Therapy (Addendum)
[place], [place], [address] Phone: [phone]   Fax:  [phone]  Physical Therapy Treatment  Patient Details  Name: Samantha [name] MRN: [id number] Date of Birth: Oct 15, 1960 Referring Provider: Dr. Oneida Alar    Encounter Date: 08/06/2017  PT End of Session - 08/06/17 1339    Visit Number  4    Number of Visits  12    Date for PT Re-Evaluation  08/18/17    PT Start Time  1330    PT Stop Time  1413    PT Time Calculation (min)  43 min    Activity Tolerance  Patient tolerated treatment well    Behavior During Therapy  [facility] for tasks assessed/performed       Past Medical History:  Diagnosis Date  . Allergy   . Depression   . GERD (gastroesophageal reflux disease)   . Thyroid disease     History reviewed. No pertinent surgical history.  There were no vitals filed for this visit.  Subjective Assessment - 08/06/17 1336    Subjective  Patient states that the ionto patch stayed on the whole time and left the area inflammed and itchy. Patient reports pain is at a 2/10. Patient has been able to do her HEP but has trouble with the SLS.     Pertinent History  similar issue on Rt. LE (Achilles ) which has resolved, Plantar fasciitis bilat. ,     Limitations  Standing;Walking;Lifting    How long can you stand comfortably?  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  Yes    Pain Score  2     Pain Location  Ankle    Pain Orientation  Left;Posterior;Medial    Pain Descriptors / Indicators  Aching    Pain Type  Chronic pain    Pain Onset  More than a month ago    Pain Frequency  Intermittent    Aggravating Factors   weightbearing     Pain Relieving Factors  ice, body helix     Effect of Pain on Daily Activities  limits mobility                         [facility] Adult PT Treatment/Exercise - 08/06/17 0001      Knee/Hip Exercises: Aerobic   Elliptical  5 min L5 ramp, L 1 resist       Knee/Hip Exercises: Standing   Heel Raises  Both;15 reps neutral, inversion and eversion with UE assist    Forward Step Up  Step Height: 4";2 sets;10 reps;Limitations    Other Standing Knee Exercises  eccentric lowering LLE on floor x 15 reps       Manual Therapy   Manual Therapy  Soft tissue mobilization    Soft tissue mobilization  IASTM to Achilles , plantar fascia and into distal calf              PT Education - 08/06/17 1513    Education provided  Yes    Education Details  Reviewed HEP    Person(s) Educated  Patient    Methods  Explanation;Demonstration;Tactile cues;Verbal cues    Comprehension  Verbalized understanding;Returned demonstration  PT Long Term Goals - 07/27/17 1509      PT LONG TERM GOAL #1   Title  Pt will be able to score less than 37% on FOTO to demo functional improvement in LE, mobility.     Status  Unable to assess      PT LONG TERM GOAL #2   Title  Pt will be able to perform 15 consecutive heel raises on LLE with no UE assist to demo incr strength     Status  On-going      PT LONG TERM GOAL #3   Title  Pt will be able to work 3 hours and have pain < 2/10 most of the time.     Status  On-going      PT LONG TERM GOAL #4   Title  Pt will be able to walk her dog for 30 min with no increase in pain in LE.     Status  On-going            Plan - 08/06/17 1508    Clinical Impression Statement  Therapy continued with IASTM to the L gastroc/soleus and progressed ankle stability exercises using an air-ex pad. Therapy was unable to continue with ionto due to inflammation caused by the [place]. Patient had a scab after scratching the area. Therapy also haulted [place] due to the inflammed area. Pt tolerated all treatment well and continues to show progress.     Clinical  Presentation  Stable    Clinical Decision Making  Low    Rehab Potential  Excellent    PT Frequency  2x / week    PT Duration  6 weeks    PT Treatment/Interventions  ADLs/Self Care Home Management;Cryotherapy;Therapeutic exercise;Taping;Manual techniques;Neuromuscular re-education;Balance training;Ultrasound;Iontophoresis 4mg /ml Dexamethasone;Moist Heat;Dry needling    PT Next Visit Plan  ionto, IASTM, tape and progressive strength, flexibility (caution with eccentrics), consider dry needling;     PT Home Exercise Plan  gastroc/soleus stretching, tennis ball for Plantar fascia, heel raises, march , wgt shift to ball of foot , step stretch     Consulted and Agree with Plan of Care  Patient       Patient will benefit from skilled therapeutic intervention in order to improve the following deficits and impairments:  Decreased balance, Increased fascial restricitons, Pain, Decreased strength, Increased edema, Difficulty walking  Visit Diagnosis: Pain in left ankle and joints of left foot  Pain in left lower leg  Muscle weakness (generalized)  Localized edema     Problem List Patient Active Problem List   Diagnosis Date Noted  . Inflammatory heel pain 02/22/2016  . Osteoarthritis, hand 02/15/2015  . Lateral epicondylitis of right elbow 01/03/2015  . Cervical radiculopathy at C8 03/21/2014  . Degenerative disc disease, cervical 08/18/2013  . Degenerative arthritis of lumbar spine 08/18/2013  . Achilles tendinitis 01/06/2012  . HYPOTHYROIDISM 03/15/2009  . Plantar fasciitis, right 03/15/2009  . CAVUS DEFORMITY OF FOOT, ACQUIRED 03/15/2009   Carolyne Littles PT DPT  08/06/2017  Cooper Render SPT  08/06/2017, 3:15 PM  During this treatment session, the therapist was present, participating in and directing the treatment.  [place], [place], [address] Phone: [phone]   Fax:  [phone]  Name: Samantha [name] MRN: [id number] Date of Birth: [birth year]

## 2017-08-18 ENCOUNTER — Encounter: Payer: Self-pay | Admitting: Physical Therapy

## 2017-08-18 ENCOUNTER — Ambulatory Visit: Payer: BLUE CROSS/BLUE SHIELD | Attending: Internal Medicine | Admitting: Physical Therapy

## 2017-08-18 DIAGNOSIS — R600 Localized edema: Secondary | ICD-10-CM

## 2017-08-18 DIAGNOSIS — M6281 Muscle weakness (generalized): Secondary | ICD-10-CM | POA: Diagnosis not present

## 2017-08-18 DIAGNOSIS — M25572 Pain in left ankle and joints of left foot: Secondary | ICD-10-CM | POA: Diagnosis not present

## 2017-08-18 DIAGNOSIS — M79662 Pain in left lower leg: Secondary | ICD-10-CM | POA: Diagnosis not present

## 2017-08-18 NOTE — Therapy (Addendum)
[place], [place], [address] Phone: [phone]   Fax:  [phone]  Physical Therapy Treatment/Discharge  Patient Details  Name: Samantha [name] MRN: [id number] Date of Birth: 06-14-1960 Referring Provider: Dr. Oneida Alar   Encounter Date: 08/18/2017  PT End of Session - 08/18/17 1157    Visit Number  5    Number of Visits  10    Date for PT Re-Evaluation  09/22/17    PT Start Time  3559    PT Stop Time  1236    PT Time Calculation (min)  43 min    Activity Tolerance  Patient tolerated treatment well    Behavior During Therapy  [facility] for tasks assessed/performed       Past Medical History:  Diagnosis Date  . Allergy   . Depression   . GERD (gastroesophageal reflux disease)   . Thyroid disease     History reviewed. No pertinent surgical history.  There were no vitals filed for this visit.  Subjective Assessment - 08/18/17 1155    Subjective  I've noticed when I don't wear the inserts it feels better.  Hurts if I wear them for over an hour.     Currently in Pain?  Yes    Pain Score  2     Pain Location  Ankle ankle/leg/heel    Pain Orientation  Left;Posterior    Pain Descriptors / Indicators  Tightness    Pain Type  Chronic pain    Pain Onset  More than a month ago    Pain Frequency  Intermittent    Aggravating Factors   inserts    Pain Relieving Factors  ice, stretching, has not worn helix         [facility] PT Assessment - 08/18/17 0001      Observation/Other Assessments   Focus on Therapeutic Outcomes (FOTO)   45%      AROM   Overall AROM Comments  WFL all planes L ankle           [facility] Adult PT Treatment/Exercise - 08/18/17 0001      Knee/Hip Exercises: Aerobic   Elliptical  5 min L5 ramp, L 1 resist       Knee/Hip Exercises: Standing   Heel Raises  Left;1 set;15 reps      Ultrasound   Ultrasound Location  L achilles med and lateral     Ultrasound Parameters  50%, 1 MHz, 1.0 W/cm2      Ultrasound Goals  Pain      Manual Therapy   Manual therapy comments  PROM all planes     Soft tissue mobilization  gastroc, soleus and gentle friction to achilles, heel      Ankle Exercises: Stretches   Soleus Stretch  2 reps;30 seconds    Gastroc Stretch  2 reps;30 seconds             PT Education - 08/18/17 1310    Education provided  Yes    Education Details  progress, goals and POC    Person(s) Educated  Patient    Methods  Explanation;Handout    Comprehension  Verbalized understanding;Returned demonstration          PT Long Term Goals - 08/18/17 1204      PT LONG TERM GOAL #1   Title  Pt will be able to score less than 37% on FOTO to demo functional improvement in LE, mobility.     Baseline  45%  Status  Partially Met      PT LONG TERM GOAL #2   Title  Pt will be able to perform 15 consecutive heel raises on LLE with no UE assist to demo incr strength     Baseline  done with decr ROM and need for 3-4 Rt toe touched    Status  On-going      PT LONG TERM GOAL #3   Title  Pt will be able to work 3 hours and have pain < 2/10 most of the time.     Baseline  met with working, then when she goes home and sits, it becomes 4/10     Status  On-going      PT LONG TERM GOAL #4   Title  Pt will be able to walk her dog for 30 min with no increase in pain in LE.     Baseline  could do but pain increases     Status  On-going            Plan - 08/18/17 1303    Clinical Impression Statement  Patient is doing well, notices pain after she has been active and has been sitting for a bit. She has pain increase with walking for 20-30 min the store.  She has stopped wearing her inserts due to an increase in pain after 1 hour.  She declined tape today due to skin sensitivity. She had no pain as she left the clinic.  [place] did not aggravate the sensitive spot on her ankle (from patch).     Clinical Presentation  Stable    Clinical Decision Making  Low    Rehab Potential   Excellent    PT Frequency  1x / week    PT Duration  6 weeks    PT Treatment/Interventions  ADLs/Self Care Home Management;Cryotherapy;Ultrasound;Therapeutic exercise;Balance training;Patient/family education;Therapeutic activities;Manual techniques;Passive range of motion;Taping    PT Next Visit Plan   IASTM, [place], progressive strength, flexibility (caution with eccentrics), SLS and dynamic stability    PT Home Exercise Plan  gastroc/soleus stretching, tennis ball for Plantar fascia, heel raises, march , wgt shift to ball of foot , step stretch     Consulted and Agree with Plan of Care  Patient       Patient will benefit from skilled therapeutic intervention in order to improve the following deficits and impairments:  Decreased balance, Increased fascial restricitons, Pain, Decreased strength, Increased edema, Difficulty walking  Visit Diagnosis: Pain in left ankle and joints of left foot  Pain in left lower leg  Muscle weakness (generalized)  Localized edema     Problem List Patient Active Problem List   Diagnosis Date Noted  . Inflammatory heel pain 02/22/2016  . Osteoarthritis, hand 02/15/2015  . Lateral epicondylitis of right elbow 01/03/2015  . Cervical radiculopathy at C8 03/21/2014  . Degenerative disc disease, cervical 08/18/2013  . Degenerative arthritis of lumbar spine 08/18/2013  . Achilles tendinitis 01/06/2012  . HYPOTHYROIDISM 03/15/2009  . Plantar fasciitis, right 03/15/2009  . CAVUS DEFORMITY OF FOOT, ACQUIRED 03/15/2009    PAA,JENNIFER 08/18/2017, 1:18 PM  [facility] [address] [place], [place], [address] Phone: [phone]   Fax:  [phone]  Name: Rhea Kaelin MRN: [id number] Date of Birth: 09-Apr-1961  Raeford Razor, PT 08/18/17 1:18 PM Phone: [phone] Fax: [phone]   PHYSICAL THERAPY DISCHARGE SUMMARY  Visits from Start of Care: 5  Current functional level related to goals /  functional outcomes: Unknown,  did not return   Remaining deficits: See above for most recent info    Education / Equipment: HEP, RICE Plan: Patient agrees to discharge.  Patient goals were partially met. Patient is being discharged due to financial reasons.  ?????    Raeford Razor, PT 10/08/17 2:41 PM Phone: [phone] Fax: [phone]

## 2017-09-04 ENCOUNTER — Ambulatory Visit: Payer: BLUE CROSS/BLUE SHIELD | Admitting: Physical Therapy

## 2017-09-11 ENCOUNTER — Encounter: Payer: BLUE CROSS/BLUE SHIELD | Admitting: Physical Therapy

## 2017-09-15 ENCOUNTER — Encounter: Payer: Self-pay | Admitting: Physical Therapy

## 2017-11-12 DIAGNOSIS — D2372 Other benign neoplasm of skin of left lower limb, including hip: Secondary | ICD-10-CM | POA: Diagnosis not present

## 2017-11-12 DIAGNOSIS — L218 Other seborrheic dermatitis: Secondary | ICD-10-CM | POA: Diagnosis not present

## 2017-11-20 DIAGNOSIS — Z0000 Encounter for general adult medical examination without abnormal findings: Secondary | ICD-10-CM | POA: Diagnosis not present

## 2017-11-20 DIAGNOSIS — E038 Other specified hypothyroidism: Secondary | ICD-10-CM | POA: Diagnosis not present

## 2017-11-20 DIAGNOSIS — M8580 Other specified disorders of bone density and structure, unspecified site: Secondary | ICD-10-CM | POA: Diagnosis not present

## 2017-11-20 DIAGNOSIS — M859 Disorder of bone density and structure, unspecified: Secondary | ICD-10-CM | POA: Diagnosis not present

## 2017-11-24 DIAGNOSIS — M859 Disorder of bone density and structure, unspecified: Secondary | ICD-10-CM | POA: Diagnosis not present

## 2017-11-24 DIAGNOSIS — L301 Dyshidrosis [pompholyx]: Secondary | ICD-10-CM | POA: Diagnosis not present

## 2017-11-24 DIAGNOSIS — Z1389 Encounter for screening for other disorder: Secondary | ICD-10-CM | POA: Diagnosis not present

## 2017-11-24 DIAGNOSIS — F418 Other specified anxiety disorders: Secondary | ICD-10-CM | POA: Diagnosis not present

## 2017-11-24 DIAGNOSIS — M79641 Pain in right hand: Secondary | ICD-10-CM | POA: Diagnosis not present

## 2017-11-24 DIAGNOSIS — Z0000 Encounter for general adult medical examination without abnormal findings: Secondary | ICD-10-CM | POA: Diagnosis not present

## 2017-11-26 ENCOUNTER — Other Ambulatory Visit: Payer: Self-pay | Admitting: Internal Medicine

## 2017-11-26 DIAGNOSIS — M47812 Spondylosis without myelopathy or radiculopathy, cervical region: Secondary | ICD-10-CM

## 2017-11-26 DIAGNOSIS — M546 Pain in thoracic spine: Secondary | ICD-10-CM

## 2017-11-27 DIAGNOSIS — Z1212 Encounter for screening for malignant neoplasm of rectum: Secondary | ICD-10-CM | POA: Diagnosis not present

## 2017-12-18 ENCOUNTER — Other Ambulatory Visit: Payer: BLUE CROSS/BLUE SHIELD

## 2018-01-01 ENCOUNTER — Encounter: Payer: Self-pay | Admitting: Allergy

## 2018-01-01 ENCOUNTER — Ambulatory Visit: Payer: BLUE CROSS/BLUE SHIELD | Admitting: Allergy

## 2018-01-01 VITALS — BP 118/70 | HR 67 | Temp 98.1°F | Resp 17 | Ht 67.25 in | Wt 175.6 lb

## 2018-01-01 DIAGNOSIS — L299 Pruritus, unspecified: Secondary | ICD-10-CM | POA: Diagnosis not present

## 2018-01-01 DIAGNOSIS — J301 Allergic rhinitis due to pollen: Secondary | ICD-10-CM

## 2018-01-01 MED ORDER — LEVOCETIRIZINE DIHYDROCHLORIDE 5 MG PO TABS: 5.0000 mg | ORAL_TABLET | Freq: Every evening | ORAL | 5 refills | Status: DC

## 2018-01-01 MED ORDER — MONTELUKAST SODIUM 10 MG PO TABS: 10.0000 mg | ORAL_TABLET | Freq: Every day | ORAL | 5 refills | Status: DC

## 2018-01-01 MED ORDER — FLUTICASONE PROPIONATE 93 MCG/ACT NA EXHU: 2.0000 | INHALANT_SUSPENSION | Freq: Two times a day (BID) | NASAL | 5 refills | Status: DC

## 2018-01-01 NOTE — Progress Notes (Signed)
New Patient Note  RE: Samantha [name] MRN: [id number] DOB: 1961/04/14 Date of Office Visit: 01/01/2018  Referring provider: Crist Infante, MD Primary care provider: Crist Infante, MD  Chief Complaint: itching and sinus problems  History of present illness: Samantha [name] is a 57 y.o. female presenting today for consultation for pruritus and sinus issues.    She states she has "nagging sinus stuff" and states symptoms include redness of her eyelids and puffiness, itchy skin and nasal congestion with ear fullness (she states she has had decrease hearing at times -she has been evaluated by ENT and states she has been offered prednisone intermittently which she does not like to use).   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   She states her hands and feet itch primarily and is worse at night.  She denies any visible or palpable rash.  She uses cetaphil bar soap, facial cleanser and moisturizer.  She has seen dermatology and states he did not know what she had.  She was prescribed clobetasol which she states does help with the itch.   She has tried antihistamine but states she gets to dry with use.  Zyrtec makes her too dry and drowsy.  She states allegra has helped the best with the least side effects but she states she was still symptomatic.  She has used both Flonase and nasacort and feels like these sprays were not effective either.    Around 2011 she states she did have a lot of sinus infections requiring antibiotics but has not had any sinus infections since this time.    She states she has had allergy testing done in the 80s and recalls being positive to molds and states she did perform allergen immunotherapy for about 6 months.    No history of asthma, eczema or food allergy.    Review of systems: Review of Systems  Constitutional: Negative for chills,  fever and malaise/fatigue.  HENT: Positive for congestion and hearing loss. Negative for ear discharge, ear pain, nosebleeds, sore throat and tinnitus.   Eyes: Negative for pain, discharge and redness.  Respiratory: Negative for cough, shortness of breath and wheezing.   Cardiovascular: Negative for chest pain.  Gastrointestinal: Negative for abdominal pain, constipation, diarrhea, heartburn, nausea and vomiting.  Musculoskeletal: Negative for joint pain.  Skin: Positive for itching. Negative for rash.  Neurological: Negative for headaches.    All other systems negative unless noted above in HPI  Past medical history: Past Medical History:  Diagnosis Date  . Allergy   . Depression   . GERD (gastroesophageal reflux disease)   . Thyroid disease     Past surgical history: Past Surgical History:  Procedure Laterality Date  . TONSILLECTOMY      Family history:  Family History  Problem Relation Age of Onset  . Hyperlipidemia Father     Social history: Lives in a home with electric heating and central cooling; there is carpeting in the home.  There is a dog in the home.  There is no concern for water damage, mildew or roaches in the home.  She is a Electrical engineer.  She denies smoking history.    Medication List: Allergies as of 01/01/2018      Reactions   Sulfonamide Derivatives Nausea And Vomiting   Nortriptyline Other (See Comments)   depression  Omeprazole Hives      Medication List        Accurate as of 01/01/18  5:09 PM. Always use your most recent med list.          atenolol 25 MG tablet Commonly known as:  TENORMIN Take 12.5 mg by mouth daily.   calcium-vitamin D 250-125 MG-UNIT tablet Commonly known as:  OSCAL WITH D Take 1 tablet by mouth daily.   Calcium-Vitamin D3 250-125 MG-UNIT Tabs Take 1 tablet once a day   diclofenac 75 MG EC tablet Commonly known as:  VOLTAREN Take 1 tablet (75 mg total) by mouth 2 (two) times daily as needed.   diclofenac  sodium 1 % Gel Commonly known as:  VOLTAREN Apply 2 g topically 4 (four) times daily.   hydrocortisone 2.5 % ointment   MINIVELLE 0.1 MG/24HR patch Generic drug:  estradiol   progesterone 100 MG capsule Commonly known as:  PROMETRIUM Take 100 mg by mouth daily.   sertraline 25 MG tablet Commonly known as:  ZOLOFT Take 25 mg by mouth daily.   TIROSINT 100 MCG Caps Generic drug:  Levothyroxine Sodium Take 100 mcg one day, 88 mcg the next day.   TIROSINT 88 MCG Caps Generic drug:  Levothyroxine Sodium   Vitamin D3 1000 units Caps Take 1,000 Units by mouth daily.       Known medication allergies: Allergies  Allergen Reactions  . Sulfonamide Derivatives Nausea And Vomiting  . Nortriptyline Other (See Comments)    depression  . Omeprazole Hives     Physical examination: Blood pressure 118/70, pulse 67, temperature 98.1 F (36.7 C), temperature source Oral, resp. rate 17, height 5' 7.25" (1.708 m), weight 175 lb 9.6 oz (79.7 kg), SpO2 97 %.  General: Alert, interactive, in no acute distress. HEENT: PERRLA, TMs pearly gray, turbinates mildly edematous without discharge, post-pharynx non erythematous. Neck: Supple without lymphadenopathy. Lungs: Clear to auscultation without wheezing, rhonchi or rales. {no increased work of breathing. CV: Normal S1, S2 without murmurs. Abdomen: Nondistended, nontender. Skin: Warm and dry, without lesions or rashes. Extremities:  No clubbing, cyanosis or edema. Neuro:   Grossly intact.  Diagnositics/Labs:  Allergy testing: environmental allergy panel is positive to mugwort, oak, curvularia.  Intradermal testing is positive to mold 4.   Allergy testing results were read and interpreted by provider, documented by clinical staff.   Assessment and plan: Allergic rhinitis  - environmental allergy skin prick testing today is positive to mugwort (weed pollen), oak (tree pollen), molds.    - allergen avoidance measures discussed/handouts  provided  - for sinus congestion/pressure as well as ear fullness will have you Xhance nasal device which is flonase however the device allows for deeper deposition of the medicated spray into the sinuses for better control.  Demo video shown today.    - will also start Singulair 10mg  daily - take at bedtime  - trial long-acting antihistamine Xyzal 5mg  daily  Itching  - avoidance measures as above  - singulair as above  - continue use of clobetasol as directed by dermatology  - will provide with eucrisa samples that can be used in areas of itch to see if this will help subside itch.  This is a non-steroidal cream commonly used for eczema but have had some success with itch control.    Follow-up 4 months or sooner if needed  I appreciate the opportunity to take part in Delayza's care. Please do not hesitate to contact me with questions.  Sincerely,  Prudy Feeler, MD Allergy/Immunology [facility]

## 2018-01-01 NOTE — Patient Instructions (Addendum)
Sinus congestion   - environmental allergy skin prick testing today is positive to mugwort (weed pollen), oak (tree pollen), molds.    - allergen avoidance measures discussed/handouts provided  - for sinus congestion/pressure as well as ear fullness will have you Xhance nasal device which is flonase however the device allows for deeper deposition of the medicated spray into the sinuses for better control.  Demo video shown today.    - will also start Singulair 10mg  daily - take at bedtime  - trial long-acting antihistamine Xyzal 5mg  daily  Itching  - avoidance measures as above  - singulair as above  - continue use of clobetasol as directed by dermatology  - will provide with eucrisa samples that can be used in areas of itch to see if this will help subside itch.  This is a non-steroidal cream commonly used for eczema but have had some success with itch control.    Follow-up 4 months or sooner if needed

## 2018-01-05 ENCOUNTER — Ambulatory Visit
Admission: RE | Admit: 2018-01-05 | Discharge: 2018-01-05 | Disposition: A | Discharge status: Home / Self Care | Payer: BLUE CROSS/BLUE SHIELD | Source: Ambulatory Visit | Attending: Internal Medicine | Admitting: Internal Medicine

## 2018-01-05 DIAGNOSIS — M546 Pain in thoracic spine: Secondary | ICD-10-CM

## 2018-01-05 DIAGNOSIS — M5125 Other intervertebral disc displacement, thoracolumbar region: Secondary | ICD-10-CM | POA: Diagnosis not present

## 2018-01-05 DIAGNOSIS — M50223 Other cervical disc displacement at C6-C7 level: Secondary | ICD-10-CM | POA: Diagnosis not present

## 2018-01-05 DIAGNOSIS — M47812 Spondylosis without myelopathy or radiculopathy, cervical region: Secondary | ICD-10-CM

## 2018-01-16 DIAGNOSIS — Z23 Encounter for immunization: Secondary | ICD-10-CM | POA: Diagnosis not present

## 2018-01-20 DIAGNOSIS — M546 Pain in thoracic spine: Secondary | ICD-10-CM | POA: Diagnosis not present

## 2018-01-20 DIAGNOSIS — G8929 Other chronic pain: Secondary | ICD-10-CM | POA: Diagnosis not present

## 2018-01-28 DIAGNOSIS — E039 Hypothyroidism, unspecified: Secondary | ICD-10-CM | POA: Diagnosis not present

## 2018-02-02 DIAGNOSIS — E039 Hypothyroidism, unspecified: Secondary | ICD-10-CM | POA: Diagnosis not present

## 2018-02-04 ENCOUNTER — Encounter: Payer: Self-pay | Admitting: Sports Medicine

## 2018-02-04 ENCOUNTER — Ambulatory Visit (INDEPENDENT_AMBULATORY_CARE_PROVIDER_SITE_OTHER): Payer: BLUE CROSS/BLUE SHIELD | Admitting: Sports Medicine

## 2018-02-04 DIAGNOSIS — M519 Unspecified thoracic, thoracolumbar and lumbosacral intervertebral disc disorder: Secondary | ICD-10-CM | POA: Insufficient documentation

## 2018-02-04 DIAGNOSIS — M25521 Pain in right elbow: Secondary | ICD-10-CM | POA: Diagnosis not present

## 2018-02-04 DIAGNOSIS — M7662 Achilles tendinitis, left leg: Secondary | ICD-10-CM | POA: Diagnosis not present

## 2018-02-04 DIAGNOSIS — G8929 Other chronic pain: Secondary | ICD-10-CM

## 2018-02-04 MED ORDER — CYCLOBENZAPRINE HCL 10 MG PO TABS: 10.0000 mg | ORAL_TABLET | Freq: Every evening | ORAL | 2 refills | Status: DC | PRN

## 2018-02-04 NOTE — Patient Instructions (Signed)
Elbow May respond to flexeril for neck and back Do some easy biceps and triceps curls  Achilles Put foot on a step Do calf raises above and below step with weight sitting on knee  MRI You have a fairly large disc fragment in thoracic spine at t9  Cervical spine shows most change at C6/7 which goes to elbow  Flexeril 10 mg and let's try one at night

## 2018-02-04 NOTE — Assessment & Plan Note (Signed)
Secondary spasm of paraspinal MM  Flexeril 10 at HS Trial over nexr month  Reck 2 mos

## 2018-02-04 NOTE — Assessment & Plan Note (Signed)
Chronic nodular AT on left Will go to seated AT exercise Regular icing Follow 3 mos

## 2018-02-04 NOTE — Progress Notes (Signed)
Upper Back Pain  Patient has seen neurosurgery MRI with DDD at C6/7 Thoracic spinae with several bulging discs T9 level looks like disc hernaitation  RT elbow Long stanidng pain At one point had lateral epicondylitis Now hurts more post. Elbow  Left AT Long standing nodule Still periodic pain  ROS No sciatica No radicular sxs into RT arm No numbness or weakness  PE Pleasant older F BP 118/70   Ht 5' 7.5" (1.715 m)   Wt 175 lb (79.4 kg)   BMI 27.00 kg/m   Left AT shows a thick nodule at 2 to 6 cms above calcaneus No swelling No redness Minimal TTP  RT elbow Full ROM No pain at med. Or lat. Epicondyle Some TTP post. Elbow  Thoracic spind shows some spasm paraspinous MM at T 9 level No TTP over midline of spine

## 2018-02-04 NOTE — Assessment & Plan Note (Signed)
No longer pain over LE This may be worsened by neck DDD Note most at C6/7 on MRI  Curls TC and Bicep Consider elbow compression

## 2018-02-08 ENCOUNTER — Ambulatory Visit: Payer: BLUE CROSS/BLUE SHIELD | Admitting: Physical Therapy

## 2018-02-08 DIAGNOSIS — H9121 Sudden idiopathic hearing loss, right ear: Secondary | ICD-10-CM | POA: Diagnosis not present

## 2018-02-08 DIAGNOSIS — H9041 Sensorineural hearing loss, unilateral, right ear, with unrestricted hearing on the contralateral side: Secondary | ICD-10-CM | POA: Diagnosis not present

## 2018-02-09 ENCOUNTER — Other Ambulatory Visit: Payer: Self-pay | Admitting: Otolaryngology

## 2018-02-09 DIAGNOSIS — H9121 Sudden idiopathic hearing loss, right ear: Secondary | ICD-10-CM

## 2018-02-20 ENCOUNTER — Ambulatory Visit
Admission: RE | Admit: 2018-02-20 | Discharge: 2018-02-20 | Disposition: A | Discharge status: Home / Self Care | Payer: BLUE CROSS/BLUE SHIELD | Source: Ambulatory Visit | Attending: Otolaryngology | Admitting: Otolaryngology

## 2018-02-20 DIAGNOSIS — H9121 Sudden idiopathic hearing loss, right ear: Secondary | ICD-10-CM

## 2018-02-20 DIAGNOSIS — H9191 Unspecified hearing loss, right ear: Secondary | ICD-10-CM | POA: Diagnosis not present

## 2018-02-20 MED ORDER — GADOBENATE DIMEGLUMINE 529 MG/ML IV SOLN
15.0000 mL | Freq: Once | INTRAVENOUS | Status: AC | PRN
  Administered 2018-02-20: 15 mL via INTRAVENOUS

## 2018-03-04 ENCOUNTER — Ambulatory Visit: Payer: BLUE CROSS/BLUE SHIELD | Attending: Neurosurgery | Admitting: Physical Therapy

## 2018-03-04 ENCOUNTER — Encounter: Payer: Self-pay | Admitting: Physical Therapy

## 2018-03-04 ENCOUNTER — Other Ambulatory Visit: Payer: Self-pay

## 2018-03-04 DIAGNOSIS — M6281 Muscle weakness (generalized): Secondary | ICD-10-CM

## 2018-03-04 DIAGNOSIS — M546 Pain in thoracic spine: Secondary | ICD-10-CM | POA: Diagnosis not present

## 2018-03-04 DIAGNOSIS — M62830 Muscle spasm of back: Secondary | ICD-10-CM | POA: Diagnosis not present

## 2018-03-04 NOTE — Patient Instructions (Signed)

## 2018-03-05 NOTE — Therapy (Signed)
[facility] [address], [place], [place], [address] Phone: [phone]   Fax:  [phone]  Physical Therapy Evaluation  Patient Details  Name: Samantha [name] MRN: [id number] Date of Birth: Dec 25, 1960 Referring Provider (PT): Consuella Lose    Encounter Date: 03/04/2018  PT End of Session - 03/04/18 1534    Visit Number  1    Date for PT Re-Evaluation  05/04/18    Authorization Time Period  03/04/18 to 05/04/18    PT Start Time  3976    PT Stop Time  1528    PT Time Calculation (min)  43 min    Activity Tolerance  Patient tolerated treatment well;No increased pain    Behavior During Therapy  WFL for tasks assessed/performed       Past Medical History:  Diagnosis Date  . Allergy   . Depression   . GERD (gastroesophageal reflux disease)   . Thyroid disease     Past Surgical History:  Procedure Laterality Date  . TONSILLECTOMY      There were no vitals filed for this visit.   Subjective Assessment - 03/04/18 1457    Subjective  Pt reports having issues with upper back pain for several years. She worked in [employer] for 15-20 years, but It has progressively gotten worse. 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 She also notes Rt thumb and index finger numbness/tingling.    Patient Stated Goals  decrease pain with activity and house cleaning     Currently in Pain?  No/denies   Rt mid thoracic spine across bra line         [facility] PT Assessment - 03/04/18 0001      Assessment   Medical Diagnosis  pain in thoracic spine     Referring Provider (PT)  Consuella Lose     Onset Date/Surgical Date  --   "many years ago"   Hand Dominance  Right    Prior Therapy  none       Precautions   Precautions  None      Balance Screen   Has the patient fallen in the past 6 months  No    Has the patient had a decrease in activity level because of a fear of falling?   No    Is  the patient reluctant to leave their home because of a fear of falling?   No      Home Film/video editor residence      Prior Function   Level of Independence  Independent      Cognition   Overall Cognitive Status  Within Functional Limits for tasks assessed      Observation/Other Assessments   Focus on Therapeutic Outcomes (FOTO)   32% limited       Sensation   Additional Comments  pt reports N/T of Rt thumb and 2nd digit      Posture/Postural Control   Posture Comments  forward head, rounded shoulders       ROM / Strength   AROM / PROM / Strength  AROM;Strength      AROM   Overall AROM Comments  reach behind head: Rt to T2, Lt to C7; Reach behind back limited and (+) scap winging noted     AROM Assessment Site  Thoracic;Cervical    Cervical - Right Rotation  55    Cervical - Left Rotation  45  Thoracic - Right Rotation  20    passive 45   Thoracic - Left Rotation  20   passive 45 (increased symptoms)     Strength   Overall Strength Comments  Rt shoulder horizontal abduction 3/5 MMT    Strength Assessment Site  Shoulder;Elbow    Right/Left Shoulder  Right;Left    Right Shoulder Flexion  5/5    Right Shoulder ABduction  5/5    Right Shoulder External Rotation  4/5    Left Shoulder Flexion  5/5    Left Shoulder ABduction  5/5    Left Shoulder External Rotation  4/5    Right/Left Elbow  Right;Left    Right Elbow Flexion  5/5    Right Elbow Extension  5/5    Left Elbow Flexion  5/5    Left Elbow Extension  5/5      Palpation   Palpation comment  tenderness and palpable trigger points noted Rt mid thoracic paraspinals, Rt middle traps/rhomboids/levator scap                Objective measurements completed on examination: See above findings.      [facility] Adult PT Treatment/Exercise - 03/04/18 0001      Self-Care   Self-Care  Other Self-Care Comments    Other Self-Care Comments   self trigger point release with tennis ball        Exercises   Exercises  Shoulder      Manual Therapy   Manual Therapy  Myofascial release    Myofascial Release  trigger point release Rt thoracic paraspinals, Rt levator scap              PT Education - 03/04/18 1533    Education Details  eval findings/POC; self trigger point release for home; dry needling info    Person(s) Educated  Patient    Methods  Explanation;Handout;Demonstration    Comprehension  Verbalized understanding       PT Short Term Goals - 03/04/18 1551      PT SHORT TERM GOAL #1   Title  Pt wil demo consistency and independence with her initial HEP to improve ROM and strength.    Time  4    Period  Weeks    Status  New    Target Date  04/03/18        PT Long Term Goals - 03/05/18 0739      PT LONG TERM GOAL #1   Title  Pt will demo improved thoracic strength evident by her ability to actively rotate each direction atleast 45 deg.     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    Time  8    Period  Weeks    Status  New      PT LONG TERM GOAL #3   Title  Pt will demo improved shoulder strength and scapula control evident by her ability to reach overhead 10 reps without noted scapula winging.     Time  8    Period  Weeks    Status  New      PT LONG TERM GOAL #4   Title  Pt will have 5/5  MMT strength of the Lt and Rt shoulder which will improve her efficiency with cleaning during work.     Time  8    Period  Weeks    Status  New             Plan - 03/04/18 1724    Clinical Impression Statement  Pt is a pleasant 57 y.o F referred to OPPT with complaints of chronic Rt sided, mid and upper thoracic pain for many years. 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 She demonstrates poor posture awareness as well as impairments in thoracic muscle strength evident by limited active rotation. She also has signs of scapula winging during active shoulder ROM and weakness with horizontal shoulder abduction testing. Pt has palpable trigger points and pain along the mid and upper thoracic musculature which she reports was relieved some following manual muscle release to the area. She would benefit from skilled PT to address her pain, and limitations in flexibility as well as promote improvements in thoracic and scapular strength and endurance for return to her job with less limitation.     Clinical Presentation  Stable    Clinical Decision Making  Low    Rehab Potential  Good    PT Frequency  2x / week   decreased PT frequency as pain improves and pt becomes indep with HEP   PT Duration  8 weeks    PT Treatment/Interventions  ADLs/Self Care Home Management;Moist Heat;Electrical Stimulation;Cryotherapy;Therapeutic exercise;Patient/family education;Manual techniques;Therapeutic activities;Neuromuscular re-education;Dry needling;Taping;Passive range of motion    PT Next Visit Plan  Dry needling to thoracic paraspinals, upper thoracic/low cervical multifidi, levator scap; progress thoracic rotation/extension strength; begin scap strength    PT Home Exercise Plan  self massage with tennis ball    Consulted and Agree with Plan of Care  Patient       Patient will benefit from skilled therapeutic intervention in order to improve the following deficits and impairments:  Decreased activity tolerance, Decreased strength, Impaired flexibility, Postural dysfunction, Pain, Improper body mechanics, Decreased range of motion, Hypomobility, Increased muscle spasms  Visit Diagnosis: Pain in thoracic spine  Muscle spasm of back  Muscle weakness (generalized)     Problem List Patient Active Problem List   Diagnosis Date Noted  . Thoracic disc disorder  02/04/2018  . Inflammatory heel pain 02/22/2016  . Osteoarthritis, hand 02/15/2015  . Elbow pain, chronic, right 01/03/2015  . Cervical radiculopathy at C8 03/21/2014  . Degenerative disc disease, cervical 08/18/2013  . Degenerative arthritis of lumbar spine 08/18/2013  . Achilles tendinitis 01/06/2012  . HYPOTHYROIDISM 03/15/2009  . Plantar fasciitis, right 03/15/2009  . CAVUS DEFORMITY OF FOOT, ACQUIRED 03/15/2009    7:45 AM,03/05/18 Sherol Dade PT, DPT [facility] [address], [place], [place], [address] Phone: [phone]   Fax:  [phone]  Name: Samantha [name] MRN: [id number] Date of Birth: 1960/08/27

## 2018-03-09 ENCOUNTER — Encounter: Payer: Self-pay | Admitting: Physical Therapy

## 2018-03-09 ENCOUNTER — Ambulatory Visit: Payer: BLUE CROSS/BLUE SHIELD | Admitting: Physical Therapy

## 2018-03-09 DIAGNOSIS — M546 Pain in thoracic spine: Secondary | ICD-10-CM

## 2018-03-09 DIAGNOSIS — M62830 Muscle spasm of back: Secondary | ICD-10-CM | POA: Diagnosis not present

## 2018-03-09 DIAGNOSIS — M6281 Muscle weakness (generalized): Secondary | ICD-10-CM | POA: Diagnosis not present

## 2018-03-09 NOTE — Therapy (Signed)
[facility] [address] [place], [place], [address] Phone: [phone]   Fax:  [phone]  Physical Therapy Treatment  Patient Details  Name: Samantha [name] MRN: [id number] Date of Birth: 26-Feb-1961 Referring Provider (PT): Consuella Lose    Encounter Date: 03/09/2018  PT End of Session - 03/09/18 1607    Visit Number  2    Date for PT Re-Evaluation  05/04/18    Authorization Time Period  03/04/18 to 05/04/18    PT Start Time  1533    PT Stop Time  1617    PT Time Calculation (min)  44 min    Activity Tolerance  Patient tolerated treatment well       Past Medical History:  Diagnosis Date  . Allergy   . Depression   . GERD (gastroesophageal reflux disease)   . Thyroid disease     Past Surgical History:  Procedure Laterality Date  . TONSILLECTOMY      There were no vitals filed for this visit.  Subjective Assessment - 03/09/18 1536    Subjective  No pain at the moment.  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  Typically right shoulder blade in between and below and right neck.      Currently in Pain?  No/denies    Multiple Pain Sites  No                       [facility] Adult PT Treatment/Exercise - 03/09/18 0001      Moist Heat Therapy   Number Minutes Moist Heat  15 Minutes    Moist Heat Location  Shoulder;Cervical      Electrical Stimulation   Electrical Stimulation Location  right neck and scapular regions    Electrical Stimulation Action  IFC    Electrical Stimulation Parameters  4 ma 15 min     Electrical Stimulation Goals  Pain      Manual Therapy   Soft tissue mobilization  right cervical paraspinals, upper trap, levator, rhomboid, subscapularis      Neck Exercises: Stretches   Upper Trapezius Stretch  Right;3 reps;20 seconds    Levator Stretch  Right;3 reps;20 seconds    Lower Cervical/Upper Thoracic Stretch Limitations  rhomboid stretch per HEP    Other Neck Stretches  thoracic extension in chair 10x    Other Neck Stretches  scapular retractions 10xseated       Trigger Point Dry Needling - 03/09/18 1715    Consent Given?  Yes    Education Handout Provided  Yes    Muscles Treated Upper Body  Upper trapezius;Levator scapulae;Rhomboids;Subscapularis   right cervical multifidi    Upper Trapezius Response  Twitch reponse elicited;Palpable increased muscle length    Levator Scapulae Response  Twitch response elicited;Palpable increased muscle length    Rhomboids Response  Twitch response elicited;Palpable increased muscle length    Subscapularis Response  Palpable increased muscle length           PT Education - 03/09/18 1606    Education Details  dry needling after care; stretching, thoracic and scapular mobility     Person(s) Educated  Patient    Methods  Explanation;Demonstration;Handout    Comprehension  Returned demonstration;Verbalized understanding       PT Short Term Goals - 03/04/18 1551      PT SHORT TERM GOAL #1   Title  Pt wil demo consistency and independence with her initial  HEP to improve ROM and strength.    Time  4    Period  Weeks    Status  New    Target Date  04/03/18        PT Long Term Goals - 03/05/18 0739      PT LONG TERM GOAL #1   Title  Pt will demo improved thoracic strength evident by her ability to actively rotate each direction atleast 45 deg.     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    Time  8    Period  Weeks    Status  New      PT LONG TERM GOAL #3   Title  Pt will demo improved shoulder strength and scapula control evident by her ability to reach overhead 10 reps without noted scapula winging.     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     Time  8    Period  Weeks    Status  New            Plan - 03/09/18 1716    Clinical Impression Statement  The patient is very receptive to trying DN.  She has multiple tender points in right cervical and scapular muscles.  Much improved soft tissue length and mobility following DN and manual therapy.  Good pain relief with ES/heat as well.   Therapist closely monitoring response with all treatment interventions.      Rehab Potential  Good    PT Frequency  2x / week    PT Duration  8 weeks    PT Treatment/Interventions  ADLs/Self Care Home Management;Moist Heat;Electrical Stimulation;Cryotherapy;Therapeutic exercise;Patient/family education;Manual techniques;Therapeutic activities;Neuromuscular re-education;Dry needling;Taping;Passive range of motion    PT Next Visit Plan  assess response to Dry needling #1 to thoracic paraspinals, upper thoracic/low cervical multifidi, levator scap; progress thoracic rotation/extension strength; begin scap strength;  home TENs info        Patient will benefit from skilled therapeutic intervention in order to improve the following deficits and impairments:  Decreased activity tolerance, Decreased strength, Impaired flexibility, Postural dysfunction, Pain, Improper body mechanics, Decreased range of motion, Hypomobility, Increased muscle spasms  Visit Diagnosis: Pain in thoracic spine  Muscle spasm of back  Muscle weakness (generalized)     Problem List Patient Active Problem List   Diagnosis Date Noted  . Thoracic disc disorder 02/04/2018  . Inflammatory heel pain 02/22/2016  . Osteoarthritis, hand 02/15/2015  . Elbow pain, chronic, right 01/03/2015  . Cervical radiculopathy at C8 03/21/2014  . Degenerative disc disease, cervical 08/18/2013  . Degenerative arthritis of lumbar spine 08/18/2013  . Achilles tendinitis  01/06/2012  . HYPOTHYROIDISM 03/15/2009  . Plantar fasciitis, right 03/15/2009  . CAVUS DEFORMITY OF FOOT, ACQUIRED 03/15/2009   Ruben Im, PT 03/09/18 5:20 PM Phone: [phone] Fax: [phone]  Alvera Singh 03/09/2018, 5:19 PM  [facility] [address],  [place], [place], [address] Phone: [phone]   Fax:  [phone]  Name: Samantha [name] MRN: [id number] Date of Birth: 03/18/1961

## 2018-03-09 NOTE — Patient Instructions (Signed)
Access Code: [email or web address]  URL: [email or web address]  Date: 03/09/2018  Prepared by: Ruben Im   Exercises  Seated Gentle Upper Trapezius Stretch - 3 reps - 1 sets - 20 hold - 1x daily - 7x weekly  Seated Levator Scapulae Stretch - 3 reps - 1 sets - 20 hold - 1x daily - 7x weekly  Seated Rhomboid Stretch - 3 reps - 1 sets - 20 hold - 1x daily - 7x weekly  Standing Scapular Retraction - 10 reps - 1 sets - 1x daily - 7x weekly  Seated Thoracic Lumbar Extension - 10 reps - 1 sets - 1x daily - 7x weekly      Trigger Point Dry Needling  . What is Trigger Point Dry Needling (DN)? o DN is a physical therapy technique used to treat muscle pain and dysfunction. Specifically, DN helps deactivate muscle trigger points (muscle knots).  o A thin filiform needle is used to penetrate the skin and stimulate the underlying trigger point. The goal is for a local twitch response (LTR) to occur and for the trigger point to relax. No medication of any kind is injected during the procedure.   . What Does Trigger Point Dry Needling Feel Like?  o The procedure feels different for each individual patient. Some patients report that they do not actually feel the needle enter the skin and overall the process is not painful. Very mild bleeding may occur. However, many patients feel a deep cramping in the muscle in which the needle was inserted. This is the local twitch response.   Marland Kitchen How Will I feel after the treatment? o Soreness is normal, and the onset of soreness may not occur for a few hours. Typically this soreness does not last longer than two days.  o Bruising is uncommon, however; ice can be used to decrease any possible bruising.  o In rare cases feeling tired or nauseous after the treatment is normal. In addition, your symptoms may get worse before they get better, this period will typically not last longer than 24 hours.   . What Can I do After My Treatment? o Increase your hydration by  drinking more water for the next 24 hours. o You may place ice or heat on the areas treated that have become sore, however, do not use heat on inflamed or bruised areas. Heat often brings more relief post needling. o You can continue your regular activities, but vigorous activity is not recommended initially after the treatment for 24 hours. o DN is best combined with other physical therapy such as strengthening, stretching, and other therapies.      Ruben Im PT [facility] [address] [place], [place] [address] Phone # [phone] Fax [phone]

## 2018-03-23 ENCOUNTER — Ambulatory Visit: Payer: BLUE CROSS/BLUE SHIELD | Attending: Neurosurgery | Admitting: Physical Therapy

## 2018-03-23 ENCOUNTER — Encounter: Payer: Self-pay | Admitting: Physical Therapy

## 2018-03-23 DIAGNOSIS — M62830 Muscle spasm of back: Secondary | ICD-10-CM | POA: Diagnosis not present

## 2018-03-23 DIAGNOSIS — M546 Pain in thoracic spine: Secondary | ICD-10-CM | POA: Insufficient documentation

## 2018-03-23 DIAGNOSIS — M6281 Muscle weakness (generalized): Secondary | ICD-10-CM | POA: Diagnosis not present

## 2018-03-23 NOTE — Patient Instructions (Signed)
Access Code: [email or web address]  URL: [email or web address]  Date: 03/23/2018  Prepared by: Sherol Dade   Exercises  Sidelying Thoracic Lumbar Rotation - 15 reps - 2x daily - 7x weekly  Supine Shoulder Horizontal Abduction with Resistance - 10- reps - 2x daily - 7x weekly  Standing Row with Anchored Resistance - 10-15 reps - 2x daily - 7x weekly  Standing Shoulder Extension with Resistance - 10-15 reps - 2x daily - 7x weekly  Seated Isometric Cervical Sidebending - 10 reps - 5 hold - 1x daily - 7x weekly    [facility] [address] [place], [place] [address] Phone # [phone] Fax [phone]

## 2018-03-23 NOTE — Therapy (Signed)
[facility] [address] [place], [place], [address] Phone: [phone]   Fax:  [phone]  Physical Therapy Treatment  Patient Details  Name: Samantha [name] MRN: [id number] Date of Birth: Nov 17, 1960 Referring Provider (PT): Consuella Lose    Encounter Date: 03/23/2018  PT End of Session - 03/23/18 0814    Visit Number  3    Date for PT Re-Evaluation  05/04/18    Authorization Time Period  03/04/18 to 05/04/18    PT Start Time  0805    PT Stop Time  7106    PT Time Calculation (min)  53 min    Activity Tolerance  Patient tolerated treatment well;No increased pain    Behavior During Therapy  WFL for tasks assessed/performed       Past Medical History:  Diagnosis Date  . Allergy   . Depression   . GERD (gastroesophageal reflux disease)   . Thyroid disease     Past Surgical History:  Procedure Laterality Date  . TONSILLECTOMY      There were no vitals filed for this visit.  Subjective Assessment - 03/23/18 0809    Subjective  Pt states that the dry needling helped alot for a couple of days. She did alot of yard work which seemed to bring the pain back to where it was prior. She has no pain currently and her HEP is going well.     Currently in Pain?  No/denies                       [facility] [address] Adult PT Treatment/Exercise - 03/23/18 0001      Exercises   Exercises  Shoulder      Shoulder Exercises: Supine   Other Supine Exercises  horizontal abduction with red TB x15 reps       Shoulder Exercises: Seated   Other Seated Exercises  neck retraction 10x3 sec hold     Other Seated Exercises  cervical lateral flexion isometric 10x5 sec hold each side      Shoulder Exercises: Sidelying   Other Sidelying Exercises  thoracic rotation x15 reps each direction      Shoulder Exercises: Standing   Extension  Strengthening;Both;12 reps    Theraband Level (Shoulder Extension)  Level 3 (Green)    Row   Both;15 reps;Theraband    Theraband Level (Shoulder Row)  Level 4 (Blue)      Moist Heat Therapy   Number Minutes Moist Heat  12 Minutes    Moist Heat Location  Shoulder;Cervical      Electrical Stimulation   Electrical Stimulation Location  right neck and scapular regions    Electrical Stimulation Action  IFC    Electrical Stimulation Parameters  23ma 12 min    Electrical Stimulation Goals  Pain      Manual Therapy   Myofascial Release  trigger point release Rt levator scap; Rt 1st rib inferior mobilization              PT Education - 03/23/18 0859    Education Details  updated HEP; technique with therex    Person(s) Educated  Patient    Methods  Explanation;Verbal cues   emailed   Comprehension  Verbalized understanding;Returned demonstration       PT Short Term Goals - 03/23/18 0900      PT SHORT TERM GOAL #1   Title  Pt wil demo consistency and independence with her initial HEP to improve ROM  and strength.    Time  4    Period  Weeks    Status  Achieved        PT Long Term Goals - 03/05/18 0739      PT LONG TERM GOAL #1   Title  Pt will demo improved thoracic strength evident by her ability to actively rotate each direction atleast 45 deg.     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    Time  8    Period  Weeks    Status  New      PT LONG TERM GOAL #3   Title  Pt will demo improved shoulder strength and scapula control evident by her ability to reach overhead 10 reps without noted scapula winging.     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     Time  8    Period  Weeks    Status  New             Plan - 03/23/18 0903    Clinical Impression Statement  Pt had good response to dry needling last session with atleast 3-4 days of pain free activity. 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 Session focused on implementing scapular and cervical strengthening. Pt requires cuing to properly activate middle trap and rhomboids, however no increase in pain was noted end of session. Ended with heat and TENS after manual trigger point release to Rt levator scap/upper trap. No increase in pain was reported following today's treatment.     Rehab Potential  Good    PT Frequency  2x / week    PT Duration  8 weeks    PT Treatment/Interventions  ADLs/Self Care Home Management;Moist Heat;Electrical Stimulation;Cryotherapy;Therapeutic exercise;Patient/family education;Manual techniques;Therapeutic activities;Neuromuscular re-education;Dry needling;Taping;Passive range of motion    PT Next Visit Plan  DN #2 levator scap and upper trap; progress thoracic rotation/extension strength; begin scap strength;  home TENs info        Patient will benefit from skilled therapeutic intervention in order to improve the following deficits and impairments:  Decreased activity tolerance, Decreased strength, Impaired flexibility, Postural dysfunction, Pain, Improper body mechanics, Decreased range of motion, Hypomobility, Increased muscle spasms  Visit Diagnosis: Pain in thoracic spine  Muscle spasm of back  Muscle weakness (generalized)     Problem List Patient Active Problem List   Diagnosis Date Noted  . Thoracic disc disorder 02/04/2018  . Inflammatory heel pain 02/22/2016  . Osteoarthritis, hand 02/15/2015  . Elbow pain, chronic, right 01/03/2015  . Cervical radiculopathy at C8 03/21/2014  . Degenerative disc disease, cervical 08/18/2013  . Degenerative arthritis of lumbar spine 08/18/2013  . Achilles tendinitis 01/06/2012  . HYPOTHYROIDISM 03/15/2009  .  Plantar fasciitis, right 03/15/2009  . CAVUS DEFORMITY OF FOOT, ACQUIRED 03/15/2009     9:14 AM,03/23/18 Sherol Dade PT, DPT [facility] [place]  [facility] [address] [place], [place], [address] Phone: [phone]   Fax:  [phone]  Name: Brilyn Tuller MRN: [id number] Date of Birth: 01-Dec-1960

## 2018-03-25 ENCOUNTER — Encounter: Payer: Self-pay | Admitting: Physical Therapy

## 2018-03-25 ENCOUNTER — Ambulatory Visit: Payer: BLUE CROSS/BLUE SHIELD | Admitting: Physical Therapy

## 2018-03-25 DIAGNOSIS — M6281 Muscle weakness (generalized): Secondary | ICD-10-CM | POA: Diagnosis not present

## 2018-03-25 DIAGNOSIS — M62830 Muscle spasm of back: Secondary | ICD-10-CM

## 2018-03-25 DIAGNOSIS — M546 Pain in thoracic spine: Secondary | ICD-10-CM

## 2018-03-25 NOTE — Patient Instructions (Signed)
Over Head Pull: Narrow Grip       On back, knees bent, feet flat, band across thighs, elbows straight but relaxed. Pull hands apart (start). Keeping elbows straight, bring arms up and over head, hands toward floor. Keep pull steady on band. Hold momentarily. Return slowly, keeping pull steady, back to start. Repeat __19_ times. Band color __red____   Side Pull: Double Arm   On back, knees bent, feet flat. Arms perpendicular to body, shoulder level, elbows straight but relaxed. Pull arms out to sides, elbows straight. Resistance band comes across collarbones, hands toward floor. Hold momentarily. Slowly return to starting position. Repeat __10_ times. Band color _red___   Sash   On back, knees bent, feet flat, left hand on left hip, right hand above left. Pull right arm DIAGONALLY (hip to shoulder) across chest. Bring right arm along head toward floor. Hold momentarily. Slowly return to starting position. Repeat _10__ times. Do with left arm. Band color ___red___   Shoulder Rotation: Double Arm   On back, knees bent, feet flat, elbows tucked at sides, bent 90, hands palms up. Pull hands apart and down toward floor, keeping elbows near sides. Hold momentarily. Slowly return to starting position. Repeat __10_ times. Band color red______     [facility] [address], [place], [place] [address] Phone # [phone] Fax [phone]

## 2018-03-25 NOTE — Therapy (Signed)
[facility] [address] [place], [place], [address] Phone: [phone]   Fax:  [phone]  Physical Therapy Treatment  Patient Details  Name: Samantha [name] MRN: [id number] Date of Birth: May 22, 1960 Referring Provider (PT): Consuella Lose    Encounter Date: 03/25/2018  PT End of Session - 03/25/18 1442    Visit Number  4    Date for PT Re-Evaluation  05/04/18    Authorization Time Period  03/04/18 to 05/04/18    PT Start Time  1400    PT Stop Time  1445    PT Time Calculation (min)  45 min    Activity Tolerance  Patient tolerated treatment well       Past Medical History:  Diagnosis Date  . Allergy   . Depression   . GERD (gastroesophageal reflux disease)   . Thyroid disease     Past Surgical History:  Procedure Laterality Date  . TONSILLECTOMY      There were no vitals filed for this visit.  Subjective Assessment - 03/25/18 1402    Subjective  The DN was very beneficial.   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      Currently in Pain?  Yes    Pain Score  4     Pain Location  Neck    Pain Type  Chronic pain    Aggravating Factors   cleaning houses                        [facility] Adult PT Treatment/Exercise - 03/25/18 0001      Therapeutic Activites    Therapeutic Activities  ADL's;Work Simulation    Work Copy      Neck Exercises: Supine   Shoulder Flexion  Both;10 reps    Shoulder Flexion Limitations  lying on foam roll    Shoulder Abduction Limitations  lying on foam roll horizontal abduction 10x red band     Other Supine Exercise  lying on foam roll thoracic extension 10x    Other Supine Exercise  lying on foam roll with red band diagonals 10x, external rotation 10x red band       Neck Exercises: Sidelying   Other Sidelying Exercise  open books with top leg on foam roll 10x right/left      Moist Heat  Therapy   Number Minutes Moist Heat  13 Minutes    Moist Heat Location  Shoulder;Cervical      Electrical Stimulation   Electrical Stimulation Location  right neck and scapular regions    Electrical Stimulation Action  IFC    Electrical Stimulation Parameters  7 ma 13 min     Electrical Stimulation Goals  Pain      Manual Therapy   Soft tissue mobilization  right cervical paraspinals, upper trap, levator, rhomboid, subscapularis       Trigger Point Dry Needling - 03/25/18 1439    Consent Given?  Yes    Muscles Treated Upper Body  --   right cervical multifidi   Upper Trapezius Response  Twitch reponse elicited;Palpable increased muscle length    Levator Scapulae Response  Twitch response elicited;Palpable increased muscle length    Rhomboids Response  Twitch response elicited;Palpable increased muscle length       Right only    PT Education - 03/25/18 1441    Education Details  info on  foam roll;  red band scapular band    Person(s) Educated  Patient    Methods  Explanation;Demonstration;Handout    Comprehension  Returned demonstration;Verbalized understanding       PT Short Term Goals - 03/25/18 1453      PT SHORT TERM GOAL #1   Title  Pt wil demo consistency and independence with her initial HEP to improve ROM and strength.    Status  Achieved        PT Long Term Goals - 03/05/18 0739      PT LONG TERM GOAL #1   Title  Pt will demo improved thoracic strength evident by her ability to actively rotate each direction atleast 45 deg.     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    Time  8    Period  Weeks    Status  New      PT LONG TERM GOAL #3   Title  Pt will demo improved shoulder strength and scapula control evident by her ability to reach overhead 10  reps without noted scapula winging.     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     Time  8    Period  Weeks    Status  New            Plan - 03/25/18 1442    Clinical Impression Statement  The patient is receptive to body mechanics education.  Able to perform a progression of postural strengthening using the foam roll without exacerbation of pain.  Decreased tender point size and number.  Good response from dry needling and manual therapy as well as ES /heat.   Therapist closely monitoring response with all treatment interventions.   Rehab Potential  Good    PT Frequency  2x / week    PT Duration  8 weeks    PT Treatment/Interventions  ADLs/Self Care Home Management;Moist Heat;Electrical Stimulation;Cryotherapy;Therapeutic exercise;Patient/family education;Manual techniques;Therapeutic activities;Neuromuscular re-education;Dry needling;Taping;Passive range of motion    PT Next Visit Plan  recheck ROM next vist;  DN #3 levator scap and upper trap; progress thoracic rotation/extension strength; scapular strength       Patient will benefit from skilled therapeutic intervention in order to improve the following deficits and impairments:  Decreased activity tolerance, Decreased strength, Impaired flexibility, Postural dysfunction, Pain, Improper body mechanics, Decreased range of motion, Hypomobility, Increased muscle spasms  Visit Diagnosis: Pain in thoracic spine  Muscle spasm of back  Muscle weakness (generalized)     Problem List Patient Active Problem List   Diagnosis Date Noted  . Thoracic disc disorder 02/04/2018  . Inflammatory heel pain 02/22/2016  . Osteoarthritis, hand 02/15/2015  . Elbow pain, chronic, right 01/03/2015  . Cervical radiculopathy at C8 03/21/2014  . Degenerative disc disease, cervical 08/18/2013   . Degenerative arthritis of lumbar spine 08/18/2013  . Achilles tendinitis 01/06/2012  . HYPOTHYROIDISM 03/15/2009  . Plantar fasciitis, right 03/15/2009  .  CAVUS DEFORMITY OF FOOT, ACQUIRED 03/15/2009   Ruben Im, PT 03/25/18 2:55 PM Phone: [phone] Fax: [phone]  Alvera Singh 03/25/2018, 2:54 PM  [facility] [address], [place], [place], [address] Phone: [phone]   Fax:  [phone]  Name: Samantha [name] MRN: [id number] Date of Birth: [birth year]

## 2018-03-26 DIAGNOSIS — H9041 Sensorineural hearing loss, unilateral, right ear, with unrestricted hearing on the contralateral side: Secondary | ICD-10-CM | POA: Diagnosis not present

## 2018-03-26 DIAGNOSIS — H9121 Sudden idiopathic hearing loss, right ear: Secondary | ICD-10-CM | POA: Diagnosis not present

## 2018-03-30 ENCOUNTER — Ambulatory Visit: Payer: BLUE CROSS/BLUE SHIELD | Admitting: Physical Therapy

## 2018-03-30 ENCOUNTER — Encounter: Payer: Self-pay | Admitting: Physical Therapy

## 2018-03-30 DIAGNOSIS — M6281 Muscle weakness (generalized): Secondary | ICD-10-CM

## 2018-03-30 DIAGNOSIS — M62830 Muscle spasm of back: Secondary | ICD-10-CM | POA: Diagnosis not present

## 2018-03-30 DIAGNOSIS — M546 Pain in thoracic spine: Secondary | ICD-10-CM | POA: Diagnosis not present

## 2018-03-30 NOTE — Therapy (Signed)
[facility] [address] [place], [place], [address] Phone: [phone]   Fax:  [phone]  Physical Therapy Treatment  Patient Details  Name: Samantha [name] MRN: [id number] Date of Birth: 1960-11-24 Referring Provider (PT): Consuella Lose    Encounter Date: 03/30/2018  PT End of Session - 03/30/18 1606    Visit Number  5    Date for PT Re-Evaluation  05/04/18    Authorization Time Period  03/04/18 to 05/04/18    PT Start Time  9449    PT Stop Time  1625    PT Time Calculation (min)  55 min    Activity Tolerance  Patient tolerated treatment well;No increased pain    Behavior During Therapy  WFL for tasks assessed/performed       Past Medical History:  Diagnosis Date  . Allergy   . Depression   . GERD (gastroesophageal reflux disease)   . Thyroid disease     Past Surgical History:  Procedure Laterality Date  . TONSILLECTOMY      There were no vitals filed for this visit.  Subjective Assessment - 03/30/18 1532    Subjective  Pt states that things are going well. She feels atleast 50% improved since starting PT. HEP is going well.     Currently in Pain?  No/denies         [facility] PT Assessment - 03/30/18 0001      AROM   Cervical - Right Rotation  65    Cervical - Left Rotation  60    Thoracic - Right Rotation  40    Thoracic - Left Rotation  40                   [facility] Adult PT Treatment/Exercise - 03/30/18 0001      Exercises   Exercises  Other Exercises    Other Exercises   threading the needle x10 reps each direction      Shoulder Exercises: Sidelying   Flexion  Right;Left;15 reps    Flexion Weight (lbs)  2    ABduction  Right;Left;15 reps    ABduction Weight (lbs)  2      Shoulder Exercises: Standing   Row  Both;15 reps;Theraband    Theraband Level (Shoulder Row)  Level 4 (Blue)    Row Limitations  BUE abduction to 90 deg     Other Standing Exercises  BUE W's 2x10 reps  with yellow TB      Shoulder Exercises: Stretch   Other Shoulder Stretches  BUE lat stretch forward x10 reps with end range extension hold 5 sec; Rt lat stretch over foam roll x8 reps       Moist Heat Therapy   Number Minutes Moist Heat  12 Minutes    Moist Heat Location  Shoulder;Cervical      Electrical Stimulation   Electrical Stimulation Location  right neck and scapular regions    Electrical Stimulation Action  IFC    Electrical Stimulation Parameters  8 ma, 12 min    Electrical Stimulation Goals  Pain      Manual Therapy   Myofascial Release  Trigger point release Rt middle trap, Rt thoracic paraspinals             PT Education - 03/30/18 1719    Education Details  technique with therex    Person(s) Educated  Patient    Methods  Explanation;Verbal cues    Comprehension  Verbalized understanding;Returned demonstration  PT Short Term Goals - 03/30/18 1604      PT SHORT TERM GOAL #1   Title  Pt wil demo consistency and independence with her initial HEP to improve ROM and strength.    Time  4    Period  Weeks    Status  Achieved        PT Long Term Goals - 03/30/18 1605      PT LONG TERM GOAL #1   Title  Pt will demo improved thoracic strength evident by her ability to actively rotate each direction atleast 45 deg.     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    Baseline  50% improved     Time  8    Period  Weeks    Status  On-going      PT LONG TERM GOAL #3   Title  Pt will demo improved shoulder strength and scapula control evident by her ability to reach overhead 10 reps without noted scapula winging.     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     Time  8    Period  Weeks    Status  New            Plan - 03/30/18 1714    Clinical Impression Statement  Pt continues to make progress towards her goals. She feels atleast 50% improved overall, and her cervical ROM has increased by atleast 10 deg bilaterally. Pt is able to activate the middle trapezius and posterior shoulder with less difficulty, requiring tactile cues intermittently during her session. There is notable fatigue and limitations in end range shoulder flexion which were addressed with therex and manual techniques during today's session. Ended with estim and heat, and pt reported no increase in pain following today's exercises.    Rehab Potential  Good    PT Frequency  2x / week    PT Duration  8 weeks    PT Treatment/Interventions  ADLs/Self Care Home Management;Moist Heat;Electrical Stimulation;Cryotherapy;Therapeutic exercise;Patient/family education;Manual techniques;Therapeutic activities;Neuromuscular re-education;Dry needling;Taping;Passive range of motion    PT Next Visit Plan  DN #3 levator scap and upper trap, thoracic multifidi; progress thoracic rotation/extension strength; scapular strength       Patient will benefit from skilled therapeutic intervention in order to improve the following deficits and impairments:  Decreased activity tolerance, Decreased strength, Impaired flexibility, Postural dysfunction, Pain, Improper body mechanics, Decreased range of motion, Hypomobility, Increased muscle spasms  Visit Diagnosis: Pain in thoracic spine  Muscle spasm of back  Muscle weakness (generalized)     Problem List Patient Active Problem List   Diagnosis Date Noted  . Thoracic disc disorder 02/04/2018  . Inflammatory heel pain 02/22/2016  . Osteoarthritis, hand 02/15/2015  . Elbow pain, chronic, right 01/03/2015  . Cervical radiculopathy at C8 03/21/2014  . Degenerative disc disease,  cervical 08/18/2013  . Degenerative arthritis of lumbar spine 08/18/2013  .  Achilles tendinitis 01/06/2012  . HYPOTHYROIDISM 03/15/2009  . Plantar fasciitis, right 03/15/2009  . CAVUS DEFORMITY OF FOOT, ACQUIRED 03/15/2009     5:19 PM,03/30/18 Sherol Dade PT, DPT [facility] [address] [place], [place], [address] Phone: [phone]   Fax:  [phone]  Name: Samantha [name] MRN: [id number] Date of Birth: October 18, 1960

## 2018-04-01 ENCOUNTER — Encounter: Payer: Self-pay | Admitting: Physical Therapy

## 2018-04-01 ENCOUNTER — Ambulatory Visit: Payer: BLUE CROSS/BLUE SHIELD | Admitting: Physical Therapy

## 2018-04-01 DIAGNOSIS — M6281 Muscle weakness (generalized): Secondary | ICD-10-CM | POA: Diagnosis not present

## 2018-04-01 DIAGNOSIS — M546 Pain in thoracic spine: Secondary | ICD-10-CM | POA: Diagnosis not present

## 2018-04-01 DIAGNOSIS — M62830 Muscle spasm of back: Secondary | ICD-10-CM | POA: Diagnosis not present

## 2018-04-01 NOTE — Therapy (Signed)
[facility] [address], [place], [place], [address] Phone: [phone]   Fax:  [phone]  Physical Therapy Treatment  Patient Details  Name: Samantha [name] MRN: [id number] Date of Birth: 1961/03/01 Referring Provider (PT): Consuella Lose    Encounter Date: 04/01/2018  PT End of Session - 04/01/18 1514    Visit Number  6    Date for PT Re-Evaluation  05/04/18    Authorization Time Period  03/04/18 to 05/04/18    PT Start Time  1451    PT Stop Time  1530    PT Time Calculation (min)  39 min    Activity Tolerance  Patient tolerated treatment well;No increased pain    Behavior During Therapy  WFL for tasks assessed/performed       Past Medical History:  Diagnosis Date  . Allergy   . Depression   . GERD (gastroesophageal reflux disease)   . Thyroid disease     Past Surgical History:  Procedure Laterality Date  . TONSILLECTOMY      There were no vitals filed for this visit.  Subjective Assessment - 04/01/18 1453    Subjective  Pt states that things are going well. She had to clean a house and is a little tight but not too bad.     Currently in Pain?  Yes    Pain Score  3     Pain Location  Thoracic    Pain Orientation  Right;Mid;Posterior    Pain Descriptors / Indicators  Aching    Pain Radiating Towards  none     Pain Onset  Today    Pain Frequency  Intermittent    Aggravating Factors   cleaning houses     Pain Relieving Factors  execises and dry needling                       [facility] Adult PT Treatment/Exercise - 04/01/18 0001      Shoulder Exercises: Supine   Flexion  Strengthening;Both;10 reps;Theraband    Theraband Level (Shoulder Flexion)  Level 1 (Yellow)   around wrists   Flexion Limitations  x2 sets       Shoulder Exercises: Seated   Other Seated Exercises  bow and arrow with yellow TB x10 reps Lt and Rt, with red TB x10 reps       Shoulder Exercises: Sidelying   External Rotation  Right;Left;Strengthening    External Rotation Weight (lbs)  2    External Rotation Limitations  towel underneath arm    Other Sidelying Exercises  Lt and Rt shoulder horizontal abduction x20 reps each       Manual Therapy   Soft tissue mobilization  STM Rt latissimus, teres muscle group, Rt upper trap       Trigger Point Dry Needling - 04/01/18 1506    Consent Given?  Yes    Muscles Treated Upper Body  --   Rt teres minor/major   Upper Trapezius Response  Twitch reponse elicited;Palpable increased muscle length   Rt           PT Education - 04/01/18 1536    Education Details  technique with therex    Person(s) Educated  Patient    Methods  Explanation;Verbal cues    Comprehension  Returned demonstration;Verbalized understanding       PT Short Term Goals - 03/30/18 1604      PT SHORT TERM GOAL #1   Title  Pt  wil demo consistency and independence with her initial HEP to improve ROM and strength.    Time  4    Period  Weeks    Status  Achieved        PT Long Term Goals - 03/30/18 1605      PT LONG TERM GOAL #1   Title  Pt will demo improved thoracic strength evident by her ability to actively rotate each direction atleast 45 deg.     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    Baseline  50% improved     Time  8    Period  Weeks    Status  On-going      PT LONG TERM GOAL #3   Title  Pt will demo improved shoulder strength and scapula control evident by her ability to reach overhead 10 reps without noted scapula winging.     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 This is a great improvement from when she started PT. Session focused on promoting shoulder strength, with intermittent cuing to increase scapula stabilization to decrease shoulder shrug. Pt did have palpable tenderness and muscle spasm of the Rt upper trap which was decreased following dry needling to the area. Ended without increase in pain. Will continue with current POC.    Rehab Potential  Good    PT Frequency  2x / week    PT Duration  8 weeks    PT Treatment/Interventions  ADLs/Self Care Home Management;Moist Heat;Electrical Stimulation;Cryotherapy;Therapeutic exercise;Patient/family education;Manual techniques;Therapeutic activities;Neuromuscular re-education;Dry needling;Taping;Passive range of motion    PT Next Visit Plan  DN #3 levator scap and thoracic multifidi; progress thoracic rotation/extension strength; scapular strength       Patient will benefit from skilled therapeutic intervention in order to improve the following deficits and impairments:  Decreased activity tolerance, Decreased strength, Impaired flexibility, Postural dysfunction, Pain, Improper body mechanics, Decreased range of motion, Hypomobility, Increased muscle spasms  Visit Diagnosis: Pain in thoracic spine  Muscle spasm of back  Muscle weakness (generalized)     Problem List Patient Active Problem List   Diagnosis Date Noted  . Thoracic disc disorder 02/04/2018  . Inflammatory heel pain 02/22/2016  . Osteoarthritis, hand 02/15/2015  . Elbow pain, chronic, right 01/03/2015  . Cervical radiculopathy at C8 03/21/2014  . Degenerative disc disease, cervical 08/18/2013  . Degenerative arthritis of lumbar spine 08/18/2013  . Achilles tendinitis 01/06/2012  . HYPOTHYROIDISM 03/15/2009  . Plantar  fasciitis, right 03/15/2009  . CAVUS DEFORMITY OF FOOT, ACQUIRED 03/15/2009    3:37 PM,04/01/18 Sherol Dade PT, DPT  [facility]  [facility] [address] [place], [place], [address] Phone: [phone]   Fax:  [phone]  Name: Samantha [name] MRN: [id number] Date of Birth: [birth year]

## 2018-04-09 ENCOUNTER — Encounter: Payer: BLUE CROSS/BLUE SHIELD | Admitting: Physical Therapy

## 2018-04-16 ENCOUNTER — Encounter

## 2018-04-20 ENCOUNTER — Encounter: Payer: Self-pay | Admitting: Physical Therapy

## 2018-04-20 ENCOUNTER — Ambulatory Visit: Payer: BLUE CROSS/BLUE SHIELD | Attending: Neurosurgery | Admitting: Physical Therapy

## 2018-04-20 DIAGNOSIS — M62830 Muscle spasm of back: Secondary | ICD-10-CM | POA: Insufficient documentation

## 2018-04-20 DIAGNOSIS — M546 Pain in thoracic spine: Secondary | ICD-10-CM | POA: Insufficient documentation

## 2018-04-20 DIAGNOSIS — M6281 Muscle weakness (generalized): Secondary | ICD-10-CM | POA: Insufficient documentation

## 2018-04-20 NOTE — Patient Instructions (Addendum)
   TENS UNIT  This is helpful for muscle pain and spasm.   Search and Purchase a TENS 7000 2nd edition at www.tenspros.com or www.amazon.com  (It should be less than $30)     TENS unit instructions:   Do not shower or bathe with the unit on  Turn the unit off before removing electrodes or batteries  If the electrodes lose stickiness add a drop of water to the electrodes after they are disconnected from the unit and place on plastic sheet. If you continued to have difficulty, call the TENS unit company to purchase more electrodes.  Do not apply lotion on the skin area prior to use. Make sure the skin is clean and dry as this will help prolong the life of the electrodes.  After use, always check skin for unusual red areas, rash or other skin difficulties. If there are any skin problems, does not apply electrodes to the same area.  Never remove the electrodes from the unit by pulling the wires.  Do not use the TENS unit or electrodes other than as directed.  Do not change electrode placement without consulting your therapist or physician.  Keep 2 fingers with between each electrode.   Beda Dula PT [facility] [address], [address] [place], [place] [address] Phone # [phone] Fax [phone] 

## 2018-04-20 NOTE — Therapy (Signed)
[facility] [address], [place], [place], [address] Phone: [phone]   Fax:  [phone]  Physical Therapy Treatment  Patient Details  Name: Samantha [name] MRN: [id number] Date of Birth: 1960/06/16 Referring Provider (PT): Consuella Lose    Encounter Date: 04/20/2018  PT End of Session - 04/20/18 1534    Visit Number  7    Date for PT Re-Evaluation  05/04/18    Authorization Time Period  03/04/18 to 05/04/18    PT Start Time  8546    PT Stop Time  1536    PT Time Calculation (min)  51 min    Activity Tolerance  Patient tolerated treatment well       Past Medical History:  Diagnosis Date  . Allergy   . Depression   . GERD (gastroesophageal reflux disease)   . Thyroid disease     Past Surgical History:  Procedure Laterality Date  . TONSILLECTOMY      There were no vitals filed for this visit.  Subjective Assessment - 04/20/18 1450    Subjective  Things are going OK.  I think the 2 places that hurt are where the herniated discs are.  It was fine when I took the week off but when I reach overhead that triggers it.  No follow up with neurosurgeon.  I think the DN really helps.  Less tingling in thumb since starting PT.      Patient Stated Goals  decrease pain with activity and house cleaning     Currently in Pain?  Yes    Pain Score  2     Pain Location  Thoracic    Pain Orientation  Mid    Pain Type  Chronic pain    Pain Radiating Towards  right thumb tingling 2-3/10                       [facility] Adult PT Treatment/Exercise - 04/20/18 0001      Neck Exercises: Seated   Other Seated Exercise  comprehensive review of HEP and discussion of importance of HEP and regular compliance for best long term outcomes       Moist Heat Therapy   Number Minutes Moist Heat  10 Minutes    Moist Heat Location  Shoulder;Cervical      Manual Therapy   Soft tissue mobilization  right cervical paraspinals,  upper trap, levator, rhomboid, subscapularis    Myofascial Release  upper trap stretch     Scapular Mobilization  medial/lateral and superior /inferior glides grade 3;  distraction        Trigger Point Dry Needling - 04/20/18 1533    Consent Given?  Yes    Muscles Treated Upper Body  --   bil cervical multifidi; mid thoracic multifidi   Upper Trapezius Response  Twitch reponse elicited;Palpable increased muscle length    Levator Scapulae Response  Palpable increased muscle length    Rhomboids Response  Palpable increased muscle length    Subscapularis Response  Palpable increased muscle length           PT Education - 04/20/18 1531    Education Details  home TENs unit info    Person(s) Educated  Patient    Methods  Explanation;Demonstration;Handout    Comprehension  Returned demonstration;Verbalized understanding       PT Short Term Goals - 03/30/18 1604      PT SHORT TERM GOAL #1   Title  Pt wil demo consistency and independence with her initial HEP to improve ROM and strength.    Time  4    Period  Weeks    Status  Achieved        PT Long Term Goals - 03/30/18 1605      PT LONG TERM GOAL #1   Title  Pt will demo improved thoracic strength evident by her ability to actively rotate each direction atleast 45 deg.     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    Baseline  50% improved     Time  8    Period  Weeks    Status  On-going      PT LONG TERM GOAL #3   Title  Pt will demo improved shoulder strength and scapula control evident by her ability to reach overhead 10 reps without noted scapula winging.     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     Time  8    Period  Weeks    Status  New            Plan - 04/20/18 1534    Clinical Impression Statement  The patient reports decreased overall right thumb numbness/tingling and neck pain overall although the pain is easily aggravated with reaching overhead.  She has tender points in right > left upper traps, right rhomboids and thoracic paraspinals.  She reports DN has been very helpful and we discussed she will have the best long term prognosis with continuation of her HEP particularly scapular strengthening.   Therapist closely monitoring response with all interventions.   Will decrease frequency to 1x/week.      Rehab Potential  Good    PT Frequency  2x / week    PT Duration  8 weeks    PT Treatment/Interventions  ADLs/Self Care Home Management;Moist Heat;Electrical Stimulation;Cryotherapy;Therapeutic exercise;Patient/family education;Manual techniques;Therapeutic activities;Neuromuscular re-education;Dry needling;Taping;Passive range of motion    PT Next Visit Plan  recheck cervical ROM;  DN #4 levator scap and thoracic multifidi; progress thoracic rotation/extension strength; scapular strength       Patient will benefit from skilled therapeutic intervention in order to improve the following deficits and impairments:  Decreased activity tolerance, Decreased strength, Impaired flexibility, Postural dysfunction, Pain, Improper body mechanics, Decreased range of motion, Hypomobility, Increased muscle spasms  Visit Diagnosis: Pain in thoracic spine  Muscle spasm of back  Muscle weakness (generalized)     Problem List Patient Active Problem List   Diagnosis Date Noted  . Thoracic disc disorder 02/04/2018  . Inflammatory heel pain 02/22/2016  . Osteoarthritis, hand 02/15/2015  . Elbow pain, chronic, right 01/03/2015  . Cervical radiculopathy at C8 03/21/2014  . Degenerative disc disease, cervical 08/18/2013  .  Degenerative arthritis of lumbar spine 08/18/2013  . Achilles tendinitis 01/06/2012  . HYPOTHYROIDISM 03/15/2009  . Plantar fasciitis, right 03/15/2009  . CAVUS DEFORMITY OF FOOT, ACQUIRED  03/15/2009   Ruben Im, PT 04/20/18 5:31 PM Phone: [phone] Fax: [phone]  Alvera Singh 04/20/2018, 5:31 PM  [facility] [address], [place], [place], [address] Phone: [phone]   Fax:  [phone]  Name: Samantha [name] MRN: [id number] Date of Birth: 10/08/1960

## 2018-04-29 ENCOUNTER — Encounter: Payer: BLUE CROSS/BLUE SHIELD | Admitting: Physical Therapy

## 2018-05-01 DIAGNOSIS — S90122A Contusion of left lesser toe(s) without damage to nail, initial encounter: Secondary | ICD-10-CM | POA: Diagnosis not present

## 2018-05-04 ENCOUNTER — Encounter: Payer: Self-pay | Admitting: Physical Therapy

## 2018-05-04 ENCOUNTER — Ambulatory Visit: Payer: BLUE CROSS/BLUE SHIELD | Admitting: Physical Therapy

## 2018-05-04 DIAGNOSIS — M546 Pain in thoracic spine: Secondary | ICD-10-CM | POA: Diagnosis not present

## 2018-05-04 DIAGNOSIS — M62830 Muscle spasm of back: Secondary | ICD-10-CM

## 2018-05-04 DIAGNOSIS — M6281 Muscle weakness (generalized): Secondary | ICD-10-CM | POA: Diagnosis not present

## 2018-05-04 NOTE — Patient Instructions (Signed)
Access Code: [email or web address]  URL: [email or web address]  Date: 05/04/2018  Prepared by: Sherol Dade   Exercises  Sidelying Thoracic Lumbar Rotation - 15 reps - 2x daily - 7x weekly  Seated High Shoulder Row with Anchored Resistance - 10-15 reps - 2 sets - 1x daily - 7x weekly  Standing shoulder flexion wall slides - 5-8 reps - 2-3 sets - 1x daily - 7x weekly     [place] [address], [place],  [address] Phone # [phone] Fax [phone]

## 2018-05-04 NOTE — Therapy (Signed)
[facility] [address] [place], [place], [address] Phone: [phone]   Fax:  [phone]  Physical Therapy Treatment/Re-evaluation  Patient Details  Name: Samantha [name] MRN: [id number] Date of Birth: 08/27/1960 Referring Provider (PT): Consuella Lose   Progress Note Reporting Period 03/04/18 to 05/04/18  See note below for Objective Data and Assessment of Progress/Goals.       Encounter Date: 05/04/2018  PT End of Session - 05/04/18 0942    Visit Number  8    Date for PT Re-Evaluation  05/04/18    Authorization Type  BCBS    Authorization Time Period  03/04/18 to 05/04/18; NEW: 05/05/18 to 06/11/18    PT Start Time  0935    PT Stop Time  1015    PT Time Calculation (min)  40 min    Activity Tolerance  Patient tolerated treatment well;No increased pain    Behavior During Therapy  WFL for tasks assessed/performed       Past Medical History:  Diagnosis Date  . Allergy   . Depression   . GERD (gastroesophageal reflux disease)   . Thyroid disease     Past Surgical History:  Procedure Laterality Date  . TONSILLECTOMY      There were no vitals filed for this visit.  Subjective Assessment - 05/04/18 0937    Subjective  Pt states things are going well. She feels atleast 65% improved since beginning PT. She is keeping up with her HEP and has no issues with this. She has no pain currently.     Patient Stated Goals  decrease pain with activity and house cleaning     Currently in Pain?  No/denies         [facility] PT Assessment - 05/04/18 0001      Assessment   Medical Diagnosis  pain in thoracic spine     Referring Provider (PT)  Consuella Lose     Onset Date/Surgical Date  --   "many years ago"   Hand Dominance  Right    Prior Therapy  none       Precautions   Precautions  None      Balance Screen   Has the patient fallen in the past 6 months  No    Has the patient had a decrease in activity  level because of a fear of falling?   No    Is the patient reluctant to leave their home because of a fear of falling?   No      Home Film/video editor residence      Prior Function   Level of Independence  Independent      Cognition   Overall Cognitive Status  Within Functional Limits for tasks assessed      Observation/Other Assessments   Focus on Therapeutic Outcomes (FOTO)   32% limited       Sensation   Additional Comments  Denies numbness/tingling of hand and thumb      Posture/Postural Control   Posture Comments  no limitations here      AROM   Overall AROM Comments  reach behind head: Rt to T4, Lt to T3; Reach behind back limited and pain free in shoulder and (+) scap winging noted    Cervical - Right Rotation  70    Cervical - Left Rotation  65    Thoracic - Right Rotation  40   passive 45   Thoracic - Left  Rotation  40   passive 45      Strength   Overall Strength Comments  Rt and Lt middle trap 4+/5 MMT, Rt and Lt low trap 4/5 MMT    Right Shoulder Flexion  5/5    Right Shoulder ABduction  5/5    Right Shoulder External Rotation  5/5    Left Shoulder Flexion  5/5    Left Shoulder ABduction  5/5    Left Shoulder External Rotation  5/5    Right Elbow Flexion  5/5    Right Elbow Extension  5/5    Left Elbow Flexion  5/5    Left Elbow Extension  5/5      Palpation   Palpation comment  muscle spasm noted Rt upper trap greater than Lt               [facility] Adult PT Treatment/Exercise - 05/04/18 0001      Shoulder Exercises: Sidelying   Other Sidelying Exercises  thoracic rotation with red TB resistance x10 reps Rt for HEP demo       Shoulder Exercises: Standing   Flexion  Strengthening;Both;10 reps    Flexion Limitations  protraction with yellow TB around wrist to facilitate posterior shoulder     Other Standing Exercises  BUE W's with red TB x10 reps        Lt and Rt shoulder cone stack x10 reps each, no scapula winging  noted      PT Education - 05/04/18 1021    Education Details  progress made towards goals; updated and reviewed HEP    Person(s) Educated  Patient    Methods  Explanation;Handout;Demonstration    Comprehension  Verbalized understanding;Returned demonstration       PT Short Term Goals - 05/04/18 0949      PT SHORT TERM GOAL #1   Title  Pt wil demo consistency and independence with her initial HEP to improve ROM and strength.    Time  4    Period  Weeks    Status  Achieved        PT Long Term Goals - 05/04/18 0950      PT LONG TERM GOAL #1   Title  Pt will demo improved thoracic strength evident by her ability to actively rotate each direction atleast 45 deg.     Baseline  40 deg     Time  8    Period  Weeks    Status  Partially Met      PT LONG TERM GOAL #2   Title  Pt will report atleast 75% improvement in her pain from the start of PT which will allow her to complete cleaning activity with less difficulty.    Baseline  65% improved     Time  8    Period  Weeks    Status  New    Target Date  06/11/18      PT LONG TERM GOAL #3   Title  Pt will demo improved shoulder strength and scapula control evident by her ability to reach overhead 10 reps without noted scapula winging.     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     Baseline  all except low trap  4/5 MMT    Time  5    Period  Weeks    Status  Partially Met    Target Date  06/11/18            Plan - 05/04/18 1022    Clinical Impression Statement  Pt has made good progress towards her goals since beginning PT several weeks ago. She feels atleast 65% improved, she is able to sleep through the night without pain and she is able to complete more of her work tasks without significant increase in discomfort. Pt's shoulder strength has also greatly  improved to 5/5 MMT of the external rotators and 4+/5 MMT of the middle traps. Pt does have some limitations in lower trap strength noted on today's re-evaluation. In addition, she feels that by the end of the week she has significant tightness and pain on the Rt side of her neck and lower thoracic spine. Pt would benefit from 2-3 more sessions of skilled PT spread out over the next 5 weeks to progress HEP and complete dry needling and other manual technique to ensure she is able to resume full return to work related activity without return of her pain/symptoms.     Rehab Potential  Good    PT Frequency  Biweekly    PT Duration  6 weeks    PT Treatment/Interventions  ADLs/Self Care Home Management;Moist Heat;Electrical Stimulation;Cryotherapy;Therapeutic exercise;Patient/family education;Manual techniques;Therapeutic activities;Neuromuscular re-education;Dry needling;Taping;Passive range of motion    PT Next Visit Plan  DN #4 upper trap; levator scap and thoracic multifidi; progress thoracic rotation/extension strength; middle/low trap strength in prone; scapular strength    Recommended Other Services  Access Code: [email or web address]       Patient will benefit from skilled therapeutic intervention in order to improve the following deficits and impairments:  Decreased activity tolerance, Decreased strength, Impaired flexibility, Postural dysfunction, Pain, Improper body mechanics, Decreased range of motion, Hypomobility, Increased muscle spasms  Visit Diagnosis: Pain in thoracic spine  Muscle spasm of back  Muscle weakness (generalized)     Problem List Patient Active Problem List   Diagnosis Date Noted  . Thoracic disc disorder 02/04/2018  . Inflammatory heel pain 02/22/2016  . Osteoarthritis, hand 02/15/2015  . Elbow pain, chronic, right 01/03/2015  . Cervical radiculopathy at C8 03/21/2014  . Degenerative disc disease, cervical 08/18/2013  . Degenerative arthritis of lumbar spine 08/18/2013  .  Achilles tendinitis 01/06/2012  . HYPOTHYROIDISM 03/15/2009  . Plantar fasciitis, right 03/15/2009  . CAVUS DEFORMITY OF FOOT, ACQUIRED 03/15/2009   10:42 AM,05/04/18 Sherol Dade PT, DPT [place] [facility] [place]  [facility] [address] [place], [place], [address] Phone: [phone]   Fax:  [phone]  Name: Samantha [name] MRN: [id number] Date of Birth: 1960-06-03

## 2018-05-05 ENCOUNTER — Ambulatory Visit: Payer: BLUE CROSS/BLUE SHIELD | Admitting: Allergy

## 2018-05-05 ENCOUNTER — Encounter: Payer: Self-pay | Admitting: Allergy

## 2018-05-05 VITALS — BP 118/70 | HR 72 | Resp 16

## 2018-05-05 DIAGNOSIS — J301 Allergic rhinitis due to pollen: Secondary | ICD-10-CM | POA: Diagnosis not present

## 2018-05-05 DIAGNOSIS — L299 Pruritus, unspecified: Secondary | ICD-10-CM

## 2018-05-05 NOTE — Progress Notes (Signed)
Follow-up Note  RE: Samantha [name] MRN: [id number] DOB: 1960-05-23 Date of Office Visit: 05/05/2018   History of present illness: Samantha [name] is a 58 y.o. female presenting today for follow-up of allergic rhinitis and itching.  She was last seen in the office on 01/01/18 by myself.  After the visit she did start on Xhance which she states worked very well however she started to develop nose bleeds thus she decreased from 2 sprays each nostril to 1 spray each nostril twice a day.  She then developed decreased sense of taste and a "red tongue" while on Xhance thus she stopped.  She states her sense of taste is still not back to normal.   With the singulair she states after about a week use she felt a bit depressed thus she stopped this.  Her mood returned to normal with cessation of singulair.   She states the xyzal made her drowsy.   She states she has used Human resources officer before and doesn't recall getting drowsy but she never used it consistently.   She states she did try the Eucrisa samples provided at last visit and it helped with the itch and rash she would develop on her hands.  She still has some samples left.   Review of systems: Review of Systems  Constitutional: Negative for chills, fever and malaise/fatigue.  HENT: Positive for congestion. Negative for ear discharge, ear pain, nosebleeds and sore throat.   Eyes: Negative for pain, discharge and redness.  Respiratory: Negative for cough, shortness of breath and wheezing.   Cardiovascular: Negative for chest pain.  Gastrointestinal: Negative for abdominal pain, constipation, diarrhea, heartburn, nausea and vomiting.  Musculoskeletal: Negative for joint pain.  Skin: Negative for itching and rash.  Neurological: Negative for headaches.    All other systems negative unless noted above in HPI  Past medical/social/surgical/family history have been reviewed and are unchanged unless specifically indicated below.  No changes  Medication  List: Allergies as of 05/05/2018      Reactions   Sulfonamide Derivatives Nausea And Vomiting   Nortriptyline Other (See Comments)   depression   Omeprazole Hives   Singulair [montelukast Sodium] Other (See Comments)   Depressed mood      Medication List       Accurate as of May 05, 2018  4:52 PM. Always use your most recent med list.        atenolol 25 MG tablet Commonly known as:  TENORMIN Take 12.5 mg by mouth daily.   calcium-vitamin D 250-125 MG-UNIT tablet Commonly known as:  OSCAL WITH D Take 1 tablet by mouth daily.   Calcium-Vitamin D3 250-125 MG-UNIT Tabs Take 1 tablet once a day   cyclobenzaprine 10 MG tablet Commonly known as:  FLEXERIL Take 1 tablet (10 mg total) by mouth at bedtime as needed for muscle spasms.   diclofenac 75 MG EC tablet Commonly known as:  VOLTAREN Take 1 tablet (75 mg total) by mouth 2 (two) times daily as needed.   diclofenac sodium 1 % Gel Commonly known as:  VOLTAREN Apply 2 g topically 4 (four) times daily.   Fluticasone Propionate 93 MCG/ACT Exhu Commonly known as:  XHANCE Place 2 sprays into the nose 2 (two) times daily.   hydrocortisone 2.5 % ointment   levocetirizine 5 MG tablet Commonly known as:  XYZAL Take 1 tablet (5 mg total) by mouth every evening.   MINIVELLE 0.1 MG/24HR patch Generic drug:  estradiol   progesterone 100 MG capsule Commonly known  as:  PROMETRIUM Take 100 mg by mouth daily.   sertraline 25 MG tablet Commonly known as:  ZOLOFT Take 25 mg by mouth daily.   TIROSINT 100 MCG Caps Generic drug:  Levothyroxine Sodium Take 100 mcg one day, 88 mcg the next day.   TIROSINT 88 MCG Caps Generic drug:  Levothyroxine Sodium   Vitamin D3 25 MCG (1000 UT) Caps Take 1,000 Units by mouth daily.       Known medication allergies: Allergies  Allergen Reactions  . Sulfonamide Derivatives Nausea And Vomiting  . Nortriptyline Other (See Comments)    depression  . Omeprazole Hives  . Singulair  [Montelukast Sodium] Other (See Comments)    Depressed mood     Physical examination: Blood pressure 118/70, pulse 72, resp. rate 16.  General: Alert, interactive, in no acute distress. HEENT: PERRLA, TMs pearly gray, turbinates moderately edematous without discharge, post-pharynx non erythematous. Neck: Supple without lymphadenopathy. Lungs: Clear to auscultation without wheezing, rhonchi or rales. {no increased work of breathing. CV: Normal S1, S2 without murmurs. Abdomen: Nondistended, nontender. Skin: Warm and dry, without lesions or rashes. Extremities:  No clubbing, cyanosis or edema. Neuro:   Grossly intact.  Diagnositics/Labs: None today  Assessment and plan: Allergic rhinitis  - continue avoidance measures for mugwort (weed pollen), oak (tree pollen), molds.    - for nasal congestion/drainage trial dymista 1 spray each nostril twice a day.  This is a combination nasal spray with Flonase + Astelin (nasal antihistamine).  This helps with both nasal congestion and drainage.   - start Allegra 180mg  daily  - Xhance, Xyzal and Singulair stopped due to adverse effects.  Pruritus  - avoidance measures as above  - continue use of clobetasol as directed by dermatology  - continue use of eucrisa as needed for itch/rash control  Follow-up 4-6 months or sooner if needed  I appreciate the opportunity to take part in Samantha [name]'s care. Please do not hesitate to contact me with questions.  Sincerely,   Prudy Feeler, MD Allergy/Immunology [facility]

## 2018-05-05 NOTE — Patient Instructions (Addendum)
Sinus congestion   - continue avoidance measures for mugwort (weed pollen), oak (tree pollen), molds.    - for nasal congestion/drainage trial dymista 1 spray each nostril twice a day.  This is a combination nasal spray with Flonase + Astelin (nasal antihistamine).  This helps with both nasal congestion and drainage.   - start Allegra 180mg  daily  - Xhance, Xyzal and Singulair stopped due to adverse effects.  Itching  - avoidance measures as above  - continue use of clobetasol as directed by dermatology  - continue use of eucrisa as needed for itch/rash control  Follow-up 4-6 months or sooner if needed

## 2018-05-06 ENCOUNTER — Encounter: Payer: BLUE CROSS/BLUE SHIELD | Admitting: Physical Therapy

## 2018-05-07 NOTE — Telephone Encounter (Signed)
Can you please order fluticasone 2 sprays in each nostril once a day and azelastine 2 sprays in each nostril twice a day. Thank you

## 2018-05-10 MED ORDER — AZELASTINE HCL 0.1 % NA SOLN: 2.0000 | Freq: Two times a day (BID) | NASAL | 5 refills | Status: DC

## 2018-05-10 MED ORDER — FLUTICASONE PROPIONATE 50 MCG/ACT NA SUSP: 2.0000 | Freq: Every day | NASAL | 5 refills | Status: DC

## 2018-05-10 NOTE — Telephone Encounter (Signed)
Can you please send Flonase 2 sprays in each nostril once a day and azelastine 0.1% 2 sprays in each nostril twice a day for her. She should try this combination and if it gives her thrush have her call the clinic and we will take care of it then. Thank you

## 2018-05-11 DIAGNOSIS — H90A21 Sensorineural hearing loss, unilateral, right ear, with restricted hearing on the contralateral side: Secondary | ICD-10-CM | POA: Diagnosis not present

## 2018-05-11 DIAGNOSIS — H9121 Sudden idiopathic hearing loss, right ear: Secondary | ICD-10-CM | POA: Diagnosis not present

## 2018-05-13 ENCOUNTER — Other Ambulatory Visit: Payer: Self-pay | Admitting: *Deleted

## 2018-05-13 ENCOUNTER — Ambulatory Visit: Payer: BLUE CROSS/BLUE SHIELD | Admitting: Physical Therapy

## 2018-05-13 ENCOUNTER — Telehealth: Payer: Self-pay | Admitting: *Deleted

## 2018-05-13 MED ORDER — NYSTATIN 100000 UNIT/ML MT SUSP: 4.0000 mL | Freq: Four times a day (QID) | OROMUCOSAL | 0 refills | Status: AC

## 2018-05-13 NOTE — Telephone Encounter (Signed)
Prescription has been sent in to the [facility] on [address]. Responded to patient through MyChart advising how to use medication and to reach out if no improvement after 7 days.

## 2018-05-13 NOTE — Telephone Encounter (Signed)
Can you please send in nystatin swish and swallow 100,000 units/mL. Use 4 ml every 6 hours for 7 days. Thank you. Can you please let her know this will be at her pharmacy and to call if she no improvement after 7 days of sooner if she worsens. Thank you

## 2018-05-13 NOTE — Telephone Encounter (Signed)
Can you please send in this medication and let this patient know to pick it up at her pharmacy. Thank you.  nystatin 100,000 ml  suspension. Use 4 mL suspension every 6 hours for 7 days.

## 2018-05-27 ENCOUNTER — Encounter: Payer: Self-pay | Admitting: Physical Therapy

## 2018-05-27 ENCOUNTER — Ambulatory Visit: Payer: BLUE CROSS/BLUE SHIELD | Attending: Neurosurgery | Admitting: Physical Therapy

## 2018-05-27 DIAGNOSIS — M6281 Muscle weakness (generalized): Secondary | ICD-10-CM | POA: Diagnosis not present

## 2018-05-27 DIAGNOSIS — M62830 Muscle spasm of back: Secondary | ICD-10-CM

## 2018-05-27 DIAGNOSIS — M546 Pain in thoracic spine: Secondary | ICD-10-CM | POA: Diagnosis not present

## 2018-05-27 NOTE — Therapy (Signed)
[facility] [address], [place], [place], [address] Phone: [phone]   Fax:  [phone]  Physical Therapy Treatment  Patient Details  Name: Samantha [name] MRN: [id number] Date of Birth: Jul 15, 1960 Referring Provider (PT): Consuella Lose    Encounter Date: 05/27/2018  PT End of Session - 05/27/18 1618    Visit Number  9    Date for PT Re-Evaluation  05/04/18    Authorization Type  BCBS    Authorization Time Period  03/04/18 to 05/04/18; NEW: 05/05/18 to 06/11/18    PT Start Time  1532   dry needling during session   PT Stop Time  1613    PT Time Calculation (min)  41 min    Activity Tolerance  Patient tolerated treatment well;No increased pain    Behavior During Therapy  WFL for tasks assessed/performed       Past Medical History:  Diagnosis Date  . Allergy   . Depression   . GERD (gastroesophageal reflux disease)   . Thyroid disease     Past Surgical History:  Procedure Laterality Date  . TONSILLECTOMY      There were no vitals filed for this visit.  Subjective Assessment - 05/27/18 1537    Subjective  Pt states that things have been going well. She did have a bad moment where she did too much cleaning/yard work and this is still bothering her some.     Patient Stated Goals  decrease pain with activity and house cleaning     Currently in Pain?  No/denies   just tightness                       [facility] Adult PT Treatment/Exercise - 05/27/18 0001      Self-Care   Other Self-Care Comments   rolling technique and massage for home with her foam roller      Neck Exercises: Prone   Other Prone Exercise  W's x10 reps; W with reach overhead 2x5 reps       Shoulder Exercises: Seated   Other Seated Exercises  thoracic flexion stretch x10 reps       Manual Therapy   Manual Therapy  Joint mobilization    Joint Mobilization  grade III CPAs cervical spine and T1-2 x1 bout     Soft tissue  mobilization  Lt and Rt cervical paraspinals        Trigger Point Dry Needling - 05/27/18 1552    Consent Given?  Yes    Muscles Treated Upper Body  --   low cervical and upper thx multifidi; (+) twitch response   Upper Trapezius Response  Twitch reponse elicited;Palpable increased muscle length   Lt and Rt    Levator Scapulae Response  Twitch response elicited;Palpable increased muscle length   Lt and Rt           PT Education - 05/27/18 1618    Education Details  technique with therex; foam rolling positions for home     Person(s) Educated  Patient    Methods  Explanation;Verbal cues    Comprehension  Verbalized understanding;Returned demonstration       PT Short Term Goals - 05/27/18 1623      PT SHORT TERM GOAL #1   Title  Pt wil demo consistency and independence with her initial HEP to improve ROM and strength.    Time  4    Period  Weeks    Status  Achieved        PT Long Term Goals - 05/27/18 1623      PT LONG TERM GOAL #1   Title  Pt will demo improved thoracic strength evident by her ability to actively rotate each direction atleast 45 deg.     Baseline  40 deg     Time  8    Period  Weeks    Status  Partially Met      PT LONG TERM GOAL #2   Title  Pt will report atleast 75% improvement in her pain from the start of PT which will allow her to complete cleaning activity with less difficulty.    Baseline  65% improved     Time  8    Period  Weeks    Status  New      PT LONG TERM GOAL #3   Title  Pt will demo improved shoulder strength and scapula control evident by her ability to reach overhead 10 reps without noted scapula winging.     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     Baseline  all except low trap 4/5 MMT    Time  5    Period  Weeks    Status  Partially Met             Plan - 05/27/18 1619    Clinical Impression Statement  Pt arrived with overall good report of participating in her regular cleaning activity at home. She did have issues with heavy outdoor activity over the course of a weekend which seemed to increase soreness and tightness in the upper thoracic/lower cervical region. Therapist was able to educate her on proper activity increase over time as well as self-mobilization/stretching with her new foam roll. Ended with dry needling treatment and other manual techniques to improve upper trap and paraspinal/multifidi mobility. She denied any increase in pain end of session.     Rehab Potential  Good    PT Frequency  Biweekly    PT Duration  6 weeks    PT Treatment/Interventions  ADLs/Self Care Home Management;Moist Heat;Electrical Stimulation;Cryotherapy;Therapeutic exercise;Patient/family education;Manual techniques;Therapeutic activities;Neuromuscular re-education;Dry needling;Taping;Passive range of motion    PT Next Visit Plan  DN if needed; possible d/c; HEP progress thoracic rotation/extension strength; middle/low trap strength in prone; scapular strength       Patient will benefit from skilled therapeutic intervention in order to improve the following deficits and impairments:  Decreased activity tolerance, Decreased strength, Impaired flexibility, Postural dysfunction, Pain, Improper body mechanics, Decreased range of motion, Hypomobility, Increased muscle spasms  Visit Diagnosis: Pain in thoracic spine  Muscle spasm of back  Muscle weakness (generalized)     Problem List Patient Active Problem List   Diagnosis Date Noted  . Thoracic disc disorder 02/04/2018  . Inflammatory heel pain 02/22/2016  . Osteoarthritis, hand 02/15/2015  . Elbow pain, chronic, right 01/03/2015  . Cervical radiculopathy at C8 03/21/2014  . Degenerative disc disease, cervical 08/18/2013  . Degenerative arthritis of lumbar spine 08/18/2013  .  Achilles tendinitis 01/06/2012  . HYPOTHYROIDISM 03/15/2009  . Plantar fasciitis, right 03/15/2009  . CAVUS DEFORMITY OF FOOT, ACQUIRED 03/15/2009     4:31 PM,05/27/18 Sherol Dade PT, DPT [facility] [address] [place], [place], [address] Phone: [phone]   Fax:  [phone]  Name: Simranjit Thayer MRN: [id number] Date of Birth: 13-Oct-1960

## 2018-06-10 ENCOUNTER — Ambulatory Visit: Payer: BLUE CROSS/BLUE SHIELD | Admitting: Physical Therapy

## 2018-06-24 DIAGNOSIS — H9311 Tinnitus, right ear: Secondary | ICD-10-CM | POA: Diagnosis not present

## 2018-06-24 DIAGNOSIS — H918X1 Other specified hearing loss, right ear: Secondary | ICD-10-CM | POA: Diagnosis not present

## 2018-06-29 ENCOUNTER — Encounter: Payer: Self-pay | Admitting: Physical Therapy

## 2018-06-29 ENCOUNTER — Ambulatory Visit: Payer: BLUE CROSS/BLUE SHIELD | Attending: Neurosurgery | Admitting: Physical Therapy

## 2018-06-29 ENCOUNTER — Other Ambulatory Visit: Payer: Self-pay

## 2018-06-29 DIAGNOSIS — M62830 Muscle spasm of back: Secondary | ICD-10-CM | POA: Insufficient documentation

## 2018-06-29 DIAGNOSIS — M546 Pain in thoracic spine: Secondary | ICD-10-CM

## 2018-06-29 DIAGNOSIS — M6281 Muscle weakness (generalized): Secondary | ICD-10-CM | POA: Diagnosis not present

## 2018-06-29 NOTE — Patient Instructions (Signed)
Access Code: [email or web address]  URL: [email or web address]  Date: 06/29/2018  Prepared by: Sherol Dade   Exercises  Low Trap Setting at [facility] - 10 reps - 2 hold - 1-2x daily - 4x weekly  Drawing Bow - 10 reps - 3 sets - 1x daily - 4x weekly  Sidelying Thoracic Lumbar Rotation - 10 reps - 1 sets - 2x daily - 4x weekly  Prone Single Arm Shoulder Horizontal Abduction with Dumbbell - Palm Down - 10 reps - 2 sets - 1x daily - 4x weekly  Prone W Scapular Retraction - 10 reps - 2 sets - 1x daily - 4x weekly    [facility] [address] [place], [place] [address] Phone # [phone] Fax [phone]

## 2018-06-29 NOTE — Therapy (Signed)
[facility] [address] [place], [place], [address] Phone: [phone]   Fax:  [phone]  Physical Therapy Treatment/Discharge  Patient Details  Name: Doll Frazee MRN: [id number] Date of Birth: 14-Jul-1960 Referring Provider (PT): Consuella Lose    Encounter Date: 06/29/2018  PT End of Session - 06/29/18 1408    Visit Number  10    Date for PT Re-Evaluation  05/04/18    Authorization Type  [employer]    Authorization Time Period  03/04/18 to 05/04/18; NEW: 05/05/18 to 06/11/18    PT Start Time  1406    PT Stop Time  1436   pt deferred other treatment; felt good about her program    PT Time Calculation (min)  30 min    Activity Tolerance  Patient tolerated treatment well;No increased pain    Behavior During Therapy  WFL for tasks assessed/performed       Past Medical History:  Diagnosis Date  . Allergy   . Depression   . GERD (gastroesophageal reflux disease)   . Thyroid disease     Past Surgical History:  Procedure Laterality Date  . TONSILLECTOMY      There were no vitals filed for this visit.  Subjective Assessment - 06/29/18 1407    Subjective  Pt states that things are going well. She has no issues currently.     Patient Stated Goals  decrease pain with activity and house cleaning     Currently in Pain?  No/denies         [facility] PT Assessment - 06/29/18 0001      Assessment   Medical Diagnosis  pain in thoracic spine     Referring Provider (PT)  Consuella Lose     Onset Date/Surgical Date  --   "many years ago"   Hand Dominance  Right    Prior Therapy  none       Precautions   Precautions  None      Balance Screen   Has the patient fallen in the past 6 months  No    Has the patient had a decrease in activity level because of a fear of falling?   No    Is the patient reluctant to leave their home because of a fear of falling?   No      Home Film/video editor  residence      Prior Function   Level of Independence  Independent      Cognition   Overall Cognitive Status  Within Functional Limits for tasks assessed      Observation/Other Assessments   Focus on Therapeutic Outcomes (FOTO)   1% limited       Sensation   Additional Comments  Denies numbness/tingling of hand and thumb at this time       Posture/Postural Control   Posture Comments  no limitations here      AROM   Overall AROM Comments  reach behind head: Rt to T4, Lt to T3; Reach behind back limited and pain free in shoulder and (+) scap winging noted    Cervical - Right Rotation  70    Cervical - Left Rotation  65    Thoracic - Right Rotation  40   passive 45   Thoracic - Left Rotation  40   passive 45      Strength   Overall Strength Comments  Rt and Lt middle trap 5/5 MMT, Rt and  Lt low trap 4/5 MMT    Right Shoulder Flexion  5/5    Right Shoulder ABduction  5/5    Right Shoulder External Rotation  5/5    Left Shoulder Flexion  5/5    Left Shoulder ABduction  5/5    Left Shoulder External Rotation  5/5    Right Elbow Flexion  5/5    Right Elbow Extension  5/5    Left Elbow Flexion  5/5    Left Elbow Extension  5/5      Palpation   Palpation comment  muscle spasm noted Rt upper trap greater than Lt                    [facility] Adult PT Treatment/Exercise - 06/29/18 0001      Shoulder Exercises: Prone   Horizontal ABduction 1  Strengthening;Right;10 reps    Horizontal ABduction 1 Weight (lbs)  2    Other Prone Exercises  Prone W's BUE x8 reps HEP demo       Shoulder Exercises: Standing   Other Standing Exercises  drawing bow with green TB 2x5 reps each     Other Standing Exercises  low trap setting at wall x5 reps              PT Education - 06/29/18 1437    Education Details  updated and reviewed HEP    Person(s) Educated  Patient    Methods  Explanation;Handout;Verbal cues    Comprehension  Verbalized understanding;Returned demonstration        PT Short Term Goals - 06/29/18 1417      PT SHORT TERM GOAL #1   Title  Pt wil demo consistency and independence with her initial HEP to improve ROM and strength.    Time  4    Period  Weeks    Status  Achieved        PT Long Term Goals - 06/29/18 1417      PT LONG TERM GOAL #1   Title  Pt will demo improved thoracic strength evident by her ability to actively rotate each direction atleast 45 deg.     Baseline  45 deg     Time  8    Period  Weeks    Status  Achieved      PT LONG TERM GOAL #2   Title  Pt will report atleast 75% improvement in her pain from the start of PT which will allow her to complete cleaning activity with less difficulty.    Baseline  85% improved    Time  8    Period  Weeks    Status  Achieved      PT LONG TERM GOAL #3   Title  Pt will demo improved shoulder strength and scapula control evident by her ability to reach overhead 10 reps without noted scapula winging.     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     Baseline  all except low trap 4+/5 MMT    Time  5    Period  Weeks    Status  Partially Met            Plan - 06/29/18 1438    Clinical Impression  Statement  Pt has done well, meeting all of her short and long term goals since beginning PT. 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 She denies pain and tingling in the UE at this time. Her HEP was updated this visit and she demonstrated good understanding. She is agreeable with d/c due to all goals met and is pleased with her progress.     Rehab Potential  Good    PT Frequency  Biweekly    PT Duration  6 weeks    PT Treatment/Interventions  ADLs/Self Care Home  Management;Moist Heat;Electrical Stimulation;Cryotherapy;Therapeutic exercise;Patient/family education;Manual techniques;Therapeutic activities;Neuromuscular re-education;Dry needling;Taping;Passive range of motion    PT Next Visit Plan  d/c with HEP       Patient will benefit from skilled therapeutic intervention in order to improve the following deficits and impairments:  Decreased activity tolerance, Decreased strength, Impaired flexibility, Postural dysfunction, Pain, Improper body mechanics, Decreased range of motion, Hypomobility, Increased muscle spasms  Visit Diagnosis: Pain in thoracic spine  Muscle spasm of back  Muscle weakness (generalized)     Problem List Patient Active Problem List   Diagnosis Date Noted  . Thoracic disc disorder 02/04/2018  . Inflammatory heel pain 02/22/2016  . Osteoarthritis, hand 02/15/2015  . Elbow pain, chronic, right 01/03/2015  . Cervical radiculopathy at C8 03/21/2014  . Degenerative disc disease, cervical 08/18/2013  . Degenerative arthritis of lumbar spine 08/18/2013  . Achilles tendinitis 01/06/2012  . HYPOTHYROIDISM 03/15/2009  . Plantar fasciitis, right 03/15/2009  . CAVUS DEFORMITY OF FOOT, ACQUIRED 03/15/2009   PHYSICAL THERAPY DISCHARGE SUMMARY  Visits from Start of Care: 10  Current functional level related to goals / functional outcomes: See above for more details    Remaining deficits: See above for more details    Education / Equipment: See above for more details   Plan: Patient agrees to discharge.  Patient goals were met. Patient is being discharged due to being pleased with the current functional level.  ?????      2:41 PM,06/29/18 Sherol Dade PT, [facility]  [facility] [address] [place], [place], [address] Phone: [phone]   Fax:  [phone]  Name: Zada Haser MRN:  [id number] Date of Birth: [birth year]

## 2018-07-02 DIAGNOSIS — H9121 Sudden idiopathic hearing loss, right ear: Secondary | ICD-10-CM | POA: Diagnosis not present

## 2018-07-02 DIAGNOSIS — H9041 Sensorineural hearing loss, unilateral, right ear, with unrestricted hearing on the contralateral side: Secondary | ICD-10-CM | POA: Diagnosis not present

## 2018-08-05 ENCOUNTER — Other Ambulatory Visit: Payer: Self-pay | Admitting: *Deleted

## 2018-08-05 MED ORDER — EPINEPHRINE 0.3 MG/0.3ML IJ SOAJ: 0.3000 mg | INTRAMUSCULAR | 1 refills | Status: DC | PRN

## 2018-08-10 NOTE — Addendum Note (Signed)
Addended by: Theresia Lo on: 08/10/2018 08:56 AM   Modules accepted: Orders

## 2018-08-10 NOTE — Progress Notes (Signed)
VIALS EXP 08-10-2019

## 2018-08-11 DIAGNOSIS — J301 Allergic rhinitis due to pollen: Secondary | ICD-10-CM | POA: Diagnosis not present

## 2018-08-12 DIAGNOSIS — J3089 Other allergic rhinitis: Secondary | ICD-10-CM | POA: Diagnosis not present

## 2018-08-19 ENCOUNTER — Other Ambulatory Visit: Payer: Self-pay

## 2018-08-19 ENCOUNTER — Ambulatory Visit (INDEPENDENT_AMBULATORY_CARE_PROVIDER_SITE_OTHER): Payer: BLUE CROSS/BLUE SHIELD

## 2018-08-19 DIAGNOSIS — J309 Allergic rhinitis, unspecified: Secondary | ICD-10-CM | POA: Diagnosis not present

## 2018-08-19 NOTE — Progress Notes (Signed)
Immunotherapy   Patient Details  Name: Samantha [name] MRN: [id number] Date of Birth: November 08, 1960  08/19/2018  Tracyann Duffell started allergy injections. Patient received 0.05 of bother her blue vials. One with Pollen and the other with Mold. Patient waited in an exam room for 30 minutes with no problems. Following schedule: B Frequency: Once weekly Epi-Pen: Yes Consent signed and patient instructions given.   Herbie Drape 08/19/2018, 1:54 PM

## 2018-08-30 ENCOUNTER — Ambulatory Visit (INDEPENDENT_AMBULATORY_CARE_PROVIDER_SITE_OTHER): Payer: BLUE CROSS/BLUE SHIELD

## 2018-08-30 DIAGNOSIS — J309 Allergic rhinitis, unspecified: Secondary | ICD-10-CM

## 2018-09-09 ENCOUNTER — Ambulatory Visit (INDEPENDENT_AMBULATORY_CARE_PROVIDER_SITE_OTHER): Payer: BLUE CROSS/BLUE SHIELD

## 2018-09-09 DIAGNOSIS — J309 Allergic rhinitis, unspecified: Secondary | ICD-10-CM | POA: Diagnosis not present

## 2018-09-16 ENCOUNTER — Ambulatory Visit (INDEPENDENT_AMBULATORY_CARE_PROVIDER_SITE_OTHER): Payer: BLUE CROSS/BLUE SHIELD

## 2018-09-16 DIAGNOSIS — J309 Allergic rhinitis, unspecified: Secondary | ICD-10-CM | POA: Diagnosis not present

## 2018-09-21 ENCOUNTER — Ambulatory Visit (INDEPENDENT_AMBULATORY_CARE_PROVIDER_SITE_OTHER): Payer: BLUE CROSS/BLUE SHIELD | Admitting: *Deleted

## 2018-09-21 DIAGNOSIS — J309 Allergic rhinitis, unspecified: Secondary | ICD-10-CM

## 2018-09-28 ENCOUNTER — Ambulatory Visit (INDEPENDENT_AMBULATORY_CARE_PROVIDER_SITE_OTHER): Payer: BLUE CROSS/BLUE SHIELD | Admitting: *Deleted

## 2018-09-28 DIAGNOSIS — J309 Allergic rhinitis, unspecified: Secondary | ICD-10-CM | POA: Diagnosis not present

## 2018-10-07 ENCOUNTER — Ambulatory Visit (INDEPENDENT_AMBULATORY_CARE_PROVIDER_SITE_OTHER): Payer: BLUE CROSS/BLUE SHIELD

## 2018-10-07 DIAGNOSIS — J309 Allergic rhinitis, unspecified: Secondary | ICD-10-CM | POA: Diagnosis not present

## 2018-10-14 ENCOUNTER — Ambulatory Visit (INDEPENDENT_AMBULATORY_CARE_PROVIDER_SITE_OTHER): Payer: BLUE CROSS/BLUE SHIELD | Admitting: *Deleted

## 2018-10-14 DIAGNOSIS — J309 Allergic rhinitis, unspecified: Secondary | ICD-10-CM

## 2018-10-21 ENCOUNTER — Ambulatory Visit (INDEPENDENT_AMBULATORY_CARE_PROVIDER_SITE_OTHER): Payer: BLUE CROSS/BLUE SHIELD | Admitting: *Deleted

## 2018-10-21 DIAGNOSIS — J309 Allergic rhinitis, unspecified: Secondary | ICD-10-CM

## 2018-10-28 ENCOUNTER — Ambulatory Visit (INDEPENDENT_AMBULATORY_CARE_PROVIDER_SITE_OTHER): Payer: BLUE CROSS/BLUE SHIELD | Admitting: *Deleted

## 2018-10-28 DIAGNOSIS — J309 Allergic rhinitis, unspecified: Secondary | ICD-10-CM | POA: Diagnosis not present

## 2018-11-05 ENCOUNTER — Ambulatory Visit (INDEPENDENT_AMBULATORY_CARE_PROVIDER_SITE_OTHER): Payer: BLUE CROSS/BLUE SHIELD | Admitting: *Deleted

## 2018-11-05 DIAGNOSIS — J309 Allergic rhinitis, unspecified: Secondary | ICD-10-CM | POA: Diagnosis not present

## 2018-11-09 DIAGNOSIS — H2513 Age-related nuclear cataract, bilateral: Secondary | ICD-10-CM | POA: Diagnosis not present

## 2018-11-12 ENCOUNTER — Ambulatory Visit (INDEPENDENT_AMBULATORY_CARE_PROVIDER_SITE_OTHER): Payer: BLUE CROSS/BLUE SHIELD

## 2018-11-12 DIAGNOSIS — J309 Allergic rhinitis, unspecified: Secondary | ICD-10-CM | POA: Diagnosis not present

## 2018-11-19 ENCOUNTER — Ambulatory Visit (INDEPENDENT_AMBULATORY_CARE_PROVIDER_SITE_OTHER): Payer: BLUE CROSS/BLUE SHIELD | Admitting: *Deleted

## 2018-11-19 DIAGNOSIS — J309 Allergic rhinitis, unspecified: Secondary | ICD-10-CM | POA: Diagnosis not present

## 2018-11-25 ENCOUNTER — Ambulatory Visit (INDEPENDENT_AMBULATORY_CARE_PROVIDER_SITE_OTHER): Payer: BLUE CROSS/BLUE SHIELD | Admitting: *Deleted

## 2018-11-25 DIAGNOSIS — J309 Allergic rhinitis, unspecified: Secondary | ICD-10-CM | POA: Diagnosis not present

## 2018-12-02 ENCOUNTER — Ambulatory Visit (INDEPENDENT_AMBULATORY_CARE_PROVIDER_SITE_OTHER): Payer: BLUE CROSS/BLUE SHIELD | Admitting: *Deleted

## 2018-12-02 DIAGNOSIS — J309 Allergic rhinitis, unspecified: Secondary | ICD-10-CM | POA: Diagnosis not present

## 2018-12-10 ENCOUNTER — Ambulatory Visit (INDEPENDENT_AMBULATORY_CARE_PROVIDER_SITE_OTHER): Payer: BLUE CROSS/BLUE SHIELD

## 2018-12-10 DIAGNOSIS — J309 Allergic rhinitis, unspecified: Secondary | ICD-10-CM

## 2018-12-14 DIAGNOSIS — Z1231 Encounter for screening mammogram for malignant neoplasm of breast: Secondary | ICD-10-CM | POA: Diagnosis not present

## 2018-12-14 DIAGNOSIS — B009 Herpesviral infection, unspecified: Secondary | ICD-10-CM | POA: Diagnosis not present

## 2018-12-14 DIAGNOSIS — Z6828 Body mass index (BMI) 28.0-28.9, adult: Secondary | ICD-10-CM | POA: Diagnosis not present

## 2018-12-14 DIAGNOSIS — Z01419 Encounter for gynecological examination (general) (routine) without abnormal findings: Secondary | ICD-10-CM | POA: Diagnosis not present

## 2018-12-16 ENCOUNTER — Ambulatory Visit (INDEPENDENT_AMBULATORY_CARE_PROVIDER_SITE_OTHER): Payer: BLUE CROSS/BLUE SHIELD | Admitting: *Deleted

## 2018-12-16 DIAGNOSIS — J309 Allergic rhinitis, unspecified: Secondary | ICD-10-CM | POA: Diagnosis not present

## 2018-12-30 ENCOUNTER — Ambulatory Visit (INDEPENDENT_AMBULATORY_CARE_PROVIDER_SITE_OTHER): Payer: BLUE CROSS/BLUE SHIELD

## 2018-12-30 DIAGNOSIS — J309 Allergic rhinitis, unspecified: Secondary | ICD-10-CM | POA: Diagnosis not present

## 2019-01-05 ENCOUNTER — Ambulatory Visit (INDEPENDENT_AMBULATORY_CARE_PROVIDER_SITE_OTHER): Payer: BLUE CROSS/BLUE SHIELD | Admitting: *Deleted

## 2019-01-05 DIAGNOSIS — J309 Allergic rhinitis, unspecified: Secondary | ICD-10-CM | POA: Diagnosis not present

## 2019-01-13 ENCOUNTER — Ambulatory Visit (INDEPENDENT_AMBULATORY_CARE_PROVIDER_SITE_OTHER): Payer: BLUE CROSS/BLUE SHIELD | Admitting: *Deleted

## 2019-01-13 DIAGNOSIS — J309 Allergic rhinitis, unspecified: Secondary | ICD-10-CM

## 2019-01-15 DIAGNOSIS — Z23 Encounter for immunization: Secondary | ICD-10-CM | POA: Diagnosis not present

## 2019-01-20 ENCOUNTER — Ambulatory Visit (INDEPENDENT_AMBULATORY_CARE_PROVIDER_SITE_OTHER): Payer: BLUE CROSS/BLUE SHIELD | Admitting: *Deleted

## 2019-01-20 DIAGNOSIS — J309 Allergic rhinitis, unspecified: Secondary | ICD-10-CM

## 2019-01-28 ENCOUNTER — Ambulatory Visit (INDEPENDENT_AMBULATORY_CARE_PROVIDER_SITE_OTHER): Payer: BLUE CROSS/BLUE SHIELD

## 2019-01-28 DIAGNOSIS — J309 Allergic rhinitis, unspecified: Secondary | ICD-10-CM

## 2019-02-02 ENCOUNTER — Ambulatory Visit (INDEPENDENT_AMBULATORY_CARE_PROVIDER_SITE_OTHER): Payer: BLUE CROSS/BLUE SHIELD | Admitting: *Deleted

## 2019-02-02 DIAGNOSIS — J309 Allergic rhinitis, unspecified: Secondary | ICD-10-CM | POA: Diagnosis not present

## 2019-02-02 DIAGNOSIS — E039 Hypothyroidism, unspecified: Secondary | ICD-10-CM | POA: Diagnosis not present

## 2019-02-07 DIAGNOSIS — E039 Hypothyroidism, unspecified: Secondary | ICD-10-CM | POA: Diagnosis not present

## 2019-02-10 ENCOUNTER — Ambulatory Visit (INDEPENDENT_AMBULATORY_CARE_PROVIDER_SITE_OTHER): Payer: BLUE CROSS/BLUE SHIELD | Admitting: *Deleted

## 2019-02-10 DIAGNOSIS — J309 Allergic rhinitis, unspecified: Secondary | ICD-10-CM

## 2019-02-18 ENCOUNTER — Ambulatory Visit (INDEPENDENT_AMBULATORY_CARE_PROVIDER_SITE_OTHER): Payer: BLUE CROSS/BLUE SHIELD | Admitting: *Deleted

## 2019-02-18 DIAGNOSIS — J309 Allergic rhinitis, unspecified: Secondary | ICD-10-CM

## 2019-02-24 DIAGNOSIS — F329 Major depressive disorder, single episode, unspecified: Secondary | ICD-10-CM | POA: Diagnosis not present

## 2019-02-24 DIAGNOSIS — N951 Menopausal and female climacteric states: Secondary | ICD-10-CM | POA: Diagnosis not present

## 2019-02-25 ENCOUNTER — Ambulatory Visit (INDEPENDENT_AMBULATORY_CARE_PROVIDER_SITE_OTHER): Payer: BLUE CROSS/BLUE SHIELD | Admitting: *Deleted

## 2019-02-25 DIAGNOSIS — J309 Allergic rhinitis, unspecified: Secondary | ICD-10-CM

## 2019-03-04 ENCOUNTER — Ambulatory Visit (INDEPENDENT_AMBULATORY_CARE_PROVIDER_SITE_OTHER): Payer: Self-pay

## 2019-03-04 DIAGNOSIS — J309 Allergic rhinitis, unspecified: Secondary | ICD-10-CM

## 2019-03-15 ENCOUNTER — Ambulatory Visit (INDEPENDENT_AMBULATORY_CARE_PROVIDER_SITE_OTHER): Payer: Self-pay | Admitting: *Deleted

## 2019-03-15 DIAGNOSIS — J309 Allergic rhinitis, unspecified: Secondary | ICD-10-CM

## 2019-03-22 NOTE — Progress Notes (Signed)
VIALS EXP 03-21-20

## 2019-03-23 DIAGNOSIS — J301 Allergic rhinitis due to pollen: Secondary | ICD-10-CM

## 2019-03-24 ENCOUNTER — Ambulatory Visit (INDEPENDENT_AMBULATORY_CARE_PROVIDER_SITE_OTHER): Payer: Self-pay

## 2019-03-24 DIAGNOSIS — J309 Allergic rhinitis, unspecified: Secondary | ICD-10-CM

## 2019-04-01 ENCOUNTER — Ambulatory Visit (INDEPENDENT_AMBULATORY_CARE_PROVIDER_SITE_OTHER): Payer: Self-pay | Admitting: *Deleted

## 2019-04-01 DIAGNOSIS — J309 Allergic rhinitis, unspecified: Secondary | ICD-10-CM

## 2019-04-06 ENCOUNTER — Ambulatory Visit (INDEPENDENT_AMBULATORY_CARE_PROVIDER_SITE_OTHER): Payer: Self-pay | Admitting: *Deleted

## 2019-04-06 DIAGNOSIS — J309 Allergic rhinitis, unspecified: Secondary | ICD-10-CM

## 2019-04-13 ENCOUNTER — Ambulatory Visit (INDEPENDENT_AMBULATORY_CARE_PROVIDER_SITE_OTHER): Payer: Self-pay | Admitting: *Deleted

## 2019-04-13 DIAGNOSIS — J309 Allergic rhinitis, unspecified: Secondary | ICD-10-CM

## 2019-04-21 ENCOUNTER — Ambulatory Visit (INDEPENDENT_AMBULATORY_CARE_PROVIDER_SITE_OTHER): Payer: Self-pay | Admitting: *Deleted

## 2019-04-21 DIAGNOSIS — J309 Allergic rhinitis, unspecified: Secondary | ICD-10-CM

## 2019-04-28 ENCOUNTER — Ambulatory Visit (INDEPENDENT_AMBULATORY_CARE_PROVIDER_SITE_OTHER): Payer: Self-pay

## 2019-04-28 DIAGNOSIS — J309 Allergic rhinitis, unspecified: Secondary | ICD-10-CM

## 2019-05-05 ENCOUNTER — Ambulatory Visit (INDEPENDENT_AMBULATORY_CARE_PROVIDER_SITE_OTHER): Payer: Self-pay

## 2019-05-05 DIAGNOSIS — J309 Allergic rhinitis, unspecified: Secondary | ICD-10-CM

## 2019-05-11 ENCOUNTER — Ambulatory Visit (INDEPENDENT_AMBULATORY_CARE_PROVIDER_SITE_OTHER): Payer: Self-pay | Admitting: *Deleted

## 2019-05-11 DIAGNOSIS — J309 Allergic rhinitis, unspecified: Secondary | ICD-10-CM

## 2019-05-19 ENCOUNTER — Ambulatory Visit (INDEPENDENT_AMBULATORY_CARE_PROVIDER_SITE_OTHER): Payer: Self-pay

## 2019-05-19 DIAGNOSIS — J309 Allergic rhinitis, unspecified: Secondary | ICD-10-CM

## 2019-05-26 ENCOUNTER — Ambulatory Visit (INDEPENDENT_AMBULATORY_CARE_PROVIDER_SITE_OTHER): Payer: Self-pay

## 2019-05-26 DIAGNOSIS — J309 Allergic rhinitis, unspecified: Secondary | ICD-10-CM

## 2019-06-01 ENCOUNTER — Ambulatory Visit (INDEPENDENT_AMBULATORY_CARE_PROVIDER_SITE_OTHER): Payer: Self-pay | Admitting: *Deleted

## 2019-06-01 DIAGNOSIS — J309 Allergic rhinitis, unspecified: Secondary | ICD-10-CM

## 2019-06-09 ENCOUNTER — Ambulatory Visit (INDEPENDENT_AMBULATORY_CARE_PROVIDER_SITE_OTHER): Payer: Self-pay

## 2019-06-09 DIAGNOSIS — J309 Allergic rhinitis, unspecified: Secondary | ICD-10-CM

## 2019-06-17 ENCOUNTER — Ambulatory Visit (INDEPENDENT_AMBULATORY_CARE_PROVIDER_SITE_OTHER): Payer: Self-pay | Admitting: *Deleted

## 2019-06-17 DIAGNOSIS — J309 Allergic rhinitis, unspecified: Secondary | ICD-10-CM

## 2019-06-22 NOTE — Progress Notes (Signed)
VIALS EXP 06-21-20

## 2019-06-23 ENCOUNTER — Ambulatory Visit (INDEPENDENT_AMBULATORY_CARE_PROVIDER_SITE_OTHER): Payer: Self-pay

## 2019-06-23 DIAGNOSIS — J309 Allergic rhinitis, unspecified: Secondary | ICD-10-CM

## 2019-06-24 DIAGNOSIS — J301 Allergic rhinitis due to pollen: Secondary | ICD-10-CM

## 2019-06-29 ENCOUNTER — Ambulatory Visit (INDEPENDENT_AMBULATORY_CARE_PROVIDER_SITE_OTHER): Payer: Self-pay

## 2019-06-29 DIAGNOSIS — J309 Allergic rhinitis, unspecified: Secondary | ICD-10-CM

## 2019-07-07 ENCOUNTER — Ambulatory Visit (INDEPENDENT_AMBULATORY_CARE_PROVIDER_SITE_OTHER): Payer: Self-pay

## 2019-07-07 DIAGNOSIS — J309 Allergic rhinitis, unspecified: Secondary | ICD-10-CM

## 2019-07-09 ENCOUNTER — Other Ambulatory Visit: Payer: Self-pay | Admitting: Sports Medicine

## 2019-07-12 ENCOUNTER — Ambulatory Visit (INDEPENDENT_AMBULATORY_CARE_PROVIDER_SITE_OTHER): Payer: Self-pay

## 2019-07-12 DIAGNOSIS — J309 Allergic rhinitis, unspecified: Secondary | ICD-10-CM

## 2019-07-21 ENCOUNTER — Ambulatory Visit (INDEPENDENT_AMBULATORY_CARE_PROVIDER_SITE_OTHER): Payer: Self-pay

## 2019-07-21 DIAGNOSIS — J309 Allergic rhinitis, unspecified: Secondary | ICD-10-CM

## 2019-07-26 ENCOUNTER — Telehealth: Payer: Self-pay

## 2019-07-26 NOTE — Telephone Encounter (Signed)
Patient states she received a phone call regarding scheduling a follow up to see Dr Nelva Bush for insurance purposes regarding her allergy injections.  Patient states she does not have insurance & she is doing fine with the allergy injections.  Patient is still wondering if she needs to be seen?  Thanks

## 2019-07-26 NOTE — Telephone Encounter (Signed)
Yes it looks like her last visit was 04/2018.   Thus she would need a yearly visit just to ensure shots are going ok still and help determine when we may be able to stop once therapy is complete.  This is independent of insurance.

## 2019-07-28 ENCOUNTER — Ambulatory Visit (INDEPENDENT_AMBULATORY_CARE_PROVIDER_SITE_OTHER): Payer: Self-pay

## 2019-07-28 DIAGNOSIS — J309 Allergic rhinitis, unspecified: Secondary | ICD-10-CM

## 2019-08-03 ENCOUNTER — Other Ambulatory Visit: Payer: Self-pay

## 2019-08-03 ENCOUNTER — Encounter: Payer: Self-pay | Admitting: Physical Therapy

## 2019-08-03 ENCOUNTER — Ambulatory Visit: Payer: BLUE CROSS/BLUE SHIELD | Attending: Physician Assistant | Admitting: Physical Therapy

## 2019-08-03 DIAGNOSIS — M79601 Pain in right arm: Secondary | ICD-10-CM | POA: Insufficient documentation

## 2019-08-03 DIAGNOSIS — M546 Pain in thoracic spine: Secondary | ICD-10-CM | POA: Insufficient documentation

## 2019-08-03 DIAGNOSIS — M62830 Muscle spasm of back: Secondary | ICD-10-CM | POA: Insufficient documentation

## 2019-08-03 NOTE — Patient Instructions (Addendum)
Access Code: [email or web address] URL: [email or web address] Date: 08/03/2019 Prepared by: Almyra Free Jerilyn Gillaspie  Exercises Seated Gentle Upper Trapezius Stretch - 1 x daily - 7 x weekly - 3 reps - 1 sets - 20 hold Seated Levator Scapulae Stretch - 1 x daily - 7 x weekly - 3 reps - 1 sets - 20 hold Seated Rhomboid Stretch - 1 x daily - 7 x weekly - 3 reps - 1 sets - 20 hold Standing Scapular Retraction - 1 x daily - 7 x weekly - 10 reps - 1 sets Seated Thoracic Lumbar Extension - 1 x daily - 7 x weekly - 10 reps - 1 sets (changed to supine on foam roller)

## 2019-08-03 NOTE — Therapy (Signed)
[facility] [address], [place], [place], [address] Phone: [phone]   Fax:  [phone]  Physical Therapy Evaluation  Patient Details  Name: Samantha [name] MRN: [id number] Date of Birth: 01/12/1961 Referring Provider (PT): Ferne Reus PA-C   Encounter Date: 08/03/2019  PT End of Session - 08/03/19 1404    Visit Number  1    Date for PT Re-Evaluation  09/28/19    PT Start Time  [id number]    PT Stop Time  1448    PT Time Calculation (min)  43 min    Activity Tolerance  Patient tolerated treatment well    Behavior During Therapy  [facility] for tasks assessed/performed       Past Medical History:  Diagnosis Date  . Allergy   . Depression   . GERD (gastroesophageal reflux disease)   . Thyroid disease     Past Surgical History:  Procedure Laterality Date  . TONSILLECTOMY      There were no vitals filed for this visit.   Subjective Assessment - 08/03/19 1406    Subjective  Last fall did a lot of yard work and re-irritated her upper back. She also kept after her granddaughter (baby) for 2 months and had to carry her around and she also cleans houses. She has pain in her right Tspine and also has electrical impulse down right arm into thumb. Patient had succesful therapy in the past for the back pain, but she tried HEP 2 months ago and it made things worse. Appt with Dr. Micheline Chapman tomorrow for arm sx.    Pertinent History  depression    Currently in Pain?  Yes    Pain Score  5     Pain Location  Back    Pain Orientation  Right    Pain Descriptors / Indicators  Dull;Pressure    Pain Type  Chronic pain    Pain Radiating Towards  into right arm and thumb    Pain Onset  More than a month ago    Pain Frequency  Constant    Aggravating Factors   repetitive motion in front of her (cleaning)    Pain Relieving Factors  TPR    Effect of Pain on Daily Activities  limited         [facility] PT Assessment - 08/03/19 0001      Assessment   Medical Diagnosis  chronic Rt thoracic back pain    Referring Provider (PT)  Ferne Reus PA-C    Onset Date/Surgical Date  01/13/19    Hand Dominance  Right    Prior Therapy  yes- successful      Precautions   Precautions  None      Restrictions   Weight Bearing Restrictions  No      Balance Screen   Has the patient fallen in the past 6 months  No    Has the patient had a decrease in activity level because of a fear of falling?   No    Is the patient reluctant to leave their home because of a fear of falling?   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    Leisure  taking care of grandchild      Observation/Other Assessments   Focus  on Therapeutic Outcomes (FOTO)   46% limited (36% goal)      ROM / Strength   AROM / PROM / Strength  AROM;Strength      AROM   Overall AROM Comments  ABD 148/152 - beyond 148 increased tingling down arm; bil throacic rotation decreased 30%; with overpressure WNL pain to right    AROM Assessment Site  Cervical    Cervical Flexion  full    Cervical Extension  full    Cervical - Right Side Bend  36    Cervical - Left Side Bend  21    Cervical - Right Rotation  full    Cervical - Left Rotation  75%      Strength   Overall Strength Comments  RUE 5/5 (shoulder/elbow/wrist)      Flexibility   Soft Tissue Assessment /Muscle Length  --   tight in bil pectorals, UT, paraspinals     Palpation   Spinal mobility  marked hypomobility in thoracic and cervical spine    Palpation comment  tender at T7, T4 and lower cervical with PA mobs      Special Tests   Other special tests  neg compression/distraction; ++ULTT ulnar, median and radial                 Objective measurements completed on examination: See above findings.              PT Education - 08/03/19  1448    Education Details  Reviewed HEP; modified and added thoracic ext on foam roller vertically and horizontally. Doorway stretches.    Person(s) Educated  Patient    Methods  Explanation;Demonstration;Handout    Comprehension  Verbalized understanding;Returned demonstration       PT Short Term Goals - 08/03/19 1610      PT SHORT TERM GOAL #1   Title  Pt wil demo consistency and independence with her initial HEP to improve ROM and strength.    Time  4    Period  Weeks    Status  New    Target Date  08/31/19        PT Long Term Goals - 08/03/19 1610      PT LONG TERM GOAL #1   Title  Pt will demo improved thoracic strength evident by her ability to actively rotate each direction atleast 45 deg.     Time  8    Period  Weeks    Status  New    Target Date  09/28/19      PT LONG TERM GOAL #2   Title  Pt will report atleast 75% improvement in her pain from the start of PT which will allow her to complete cleaning activity with less difficulty.    Time  8    Period  Weeks    Status  New      PT LONG TERM GOAL #3   Title  Pt to report no sx into RUE to improve her ability to carry and lift her grandchild  and perform work functions.    Baseline  --    Time  8    Period  Weeks    Status  New      PT LONG TERM GOAL #4   Title  Improved FOTO score to <= 36% limitations    Baseline  46% limited at eval      PT LONG TERM GOAL #5   Title  Patient to demo right shoulder  ABD to [facility] with no UE tingling.    Time  8    Period  Weeks    Status  New             Plan - 08/03/19 1602    Clinical Impression Statement  Patient returns to PT with flare up of pain in the her thoracic spine which started in Oct 2020. She also has new symptom of electrical tingling down her right arm to thumb. Patient attempted previous HEP but made sx worse. She has decreased cervical and thoracic ROM, hypomobily in Cerv and thoracic spines and positive ULTT for radial, median and ulnar nerves.  Negative cervical special tests and patient has tightness in bil pectorals which may be contributing to neurologic sx. Deficits area affecting pt's ability to sleep and perform her ADLS as will as lift her grandchild. She will benefit from PT to address these deficits and return her to her PLOF.    Personal Factors and Comorbidities  Comorbidity 1    Comorbidities  depression    Examination-Activity Limitations  Caring for Others;Lift;Sleep    Stability/Clinical Decision Making  Evolving/Moderate complexity    Clinical Decision Making  Low    Rehab Potential  Excellent    PT Frequency  2x / week    PT Duration  8 weeks    PT Treatment/Interventions  ADLs/Self Care Home Management;Cryotherapy;Electrical Stimulation;Moist Heat;Traction;Neuromuscular re-education;Therapeutic exercise;Therapeutic activities;Patient/family education;Manual techniques;Dry needling;Taping;Spinal Manipulations    PT Next Visit Plan  DN to cerv/thoracic multifidi, pectorals, UT; spinal mobs/mobility; work on increasing mobility and flexibility before adding strengthening.    PT Home Exercise Plan  [email or web address]    Consulted and Agree with Plan of Care  Patient       Patient will benefit from skilled therapeutic intervention in order to improve the following deficits and impairments:  Decreased range of motion, Pain, Impaired UE functional use, Hypomobility, Impaired flexibility, Increased muscle spasms, Decreased strength  Visit Diagnosis: Pain in thoracic spine - Plan: PT plan of care cert/re-cert  Muscle spasm of back - Plan: PT plan of care cert/re-cert  Pain in right arm - Plan: PT plan of care cert/re-cert     Problem List Patient Active Problem List   Diagnosis Date Noted  . Thoracic disc disorder 02/04/2018  . Inflammatory heel pain 02/22/2016  . Osteoarthritis, hand 02/15/2015  . Elbow pain, chronic, right 01/03/2015  . Cervical radiculopathy at C8 03/21/2014  . Degenerative disc disease, cervical  08/18/2013  . Degenerative arthritis of lumbar spine 08/18/2013  . Achilles tendinitis 01/06/2012  . HYPOTHYROIDISM 03/15/2009  . Plantar fasciitis, right 03/15/2009  . CAVUS DEFORMITY OF FOOT, ACQUIRED 03/15/2009    Madelyn Flavors PT 08/03/2019, 4:16 PM  [facility] [address] [place], [place], [address] Phone: [phone]   Fax:  [phone]  Name: Lindsy Colan MRN: [id number] Date of Birth: 28-Jan-1961

## 2019-08-04 ENCOUNTER — Ambulatory Visit (INDEPENDENT_AMBULATORY_CARE_PROVIDER_SITE_OTHER): Payer: Self-pay | Admitting: Sports Medicine

## 2019-08-04 ENCOUNTER — Ambulatory Visit (INDEPENDENT_AMBULATORY_CARE_PROVIDER_SITE_OTHER): Payer: Self-pay

## 2019-08-04 ENCOUNTER — Other Ambulatory Visit: Payer: Self-pay

## 2019-08-04 VITALS — BP 124/76 | Ht 67.0 in | Wt 178.0 lb

## 2019-08-04 DIAGNOSIS — M25531 Pain in right wrist: Secondary | ICD-10-CM

## 2019-08-04 DIAGNOSIS — J309 Allergic rhinitis, unspecified: Secondary | ICD-10-CM

## 2019-08-04 DIAGNOSIS — M792 Neuralgia and neuritis, unspecified: Secondary | ICD-10-CM

## 2019-08-05 NOTE — Progress Notes (Signed)
   Subjective:    Patient ID: Samantha [name], female    DOB: 10/30/1960, 59 y.o.   MRN: [id number]  HPI chief complaint: Right shoulder and right wrist pain  59 year old right-hand-dominant female comes in today complaining of 4 weeks of pain in the right wrist.  She denies any recent trauma but does describe a gradual onset of pain that she describes as aching and burning in quality.  She localizes her pain both along the ulnar aspect and the radial aspect of her right hand and wrist.  It is worse with certain wrist and elbow movements.  Pain will occasionally radiate up the arm to the shoulder as well.  She has a history of left-sided cervical disc protrusion at C6-C7 for which she was referred to neurosurgery in 2019.  Surgery was not performed.  In addition to her pain she is also describing some numbness and tingling primarily along the radial aspect of the right thumb.  She denies any weakness.  She does get pain at night.  She did attend a single session of physical therapy yesterday and is scheduled for more visits although they will not start until sometime in May.  She has tried both Tylenol and Aleve without any help.  Interim medical history reviewed Medications reviewed Allergies reviewed    Review of Systems    As above Objective:   Physical Exam  Well-developed, well-nourished.  No acute distress.  Awake alert and oriented x3.  Vital signs reviewed.  Right shoulder: Full range of motion.  Good strength.  No signs of impingement  Neurological exam: Patient has 5/5 strength with both upper extremities.  Reflexes are trace but equal to biceps, triceps, and brachioradialis tendons.  She has a positive Tinel's at the cubital tunnel and carpal tunnel.  Positive Phalen's.  No atrophy.  Good pulses.  MRI of the cervical spine from 2019 as above.  I do not appreciate any right-sided disc herniation.      Assessment & Plan:   Neuropathic right arm pain  Diagnosis is not  straightforward.  I do think her pain is neuropathic in nature.  She has had symptoms for 4 weeks now.  I am going to initially treat this as carpal tunnel syndrome with nighttime splinting but I would like to order an EMG/nerve conduction study to evaluate further.  Differential includes cervical radiculopathy, ulnar neuritis, and carpal tunnel syndrome.  I did discuss the possibility of a short course of oral steroids but the patient declined.  I will call her with EMG/nerve conduction study results when available we will delineate further treatment based on those findings.

## 2019-08-09 ENCOUNTER — Ambulatory Visit (INDEPENDENT_AMBULATORY_CARE_PROVIDER_SITE_OTHER): Payer: Self-pay

## 2019-08-09 DIAGNOSIS — J309 Allergic rhinitis, unspecified: Secondary | ICD-10-CM

## 2019-08-19 ENCOUNTER — Ambulatory Visit (INDEPENDENT_AMBULATORY_CARE_PROVIDER_SITE_OTHER): Payer: Self-pay

## 2019-08-19 DIAGNOSIS — J309 Allergic rhinitis, unspecified: Secondary | ICD-10-CM

## 2019-08-30 ENCOUNTER — Ambulatory Visit: Payer: Self-pay | Attending: Physician Assistant | Admitting: Physical Therapy

## 2019-08-30 ENCOUNTER — Encounter: Payer: Self-pay | Admitting: Physical Therapy

## 2019-08-30 ENCOUNTER — Other Ambulatory Visit: Payer: Self-pay

## 2019-08-30 DIAGNOSIS — M6281 Muscle weakness (generalized): Secondary | ICD-10-CM

## 2019-08-30 DIAGNOSIS — M546 Pain in thoracic spine: Secondary | ICD-10-CM

## 2019-08-30 DIAGNOSIS — M62830 Muscle spasm of back: Secondary | ICD-10-CM

## 2019-08-30 DIAGNOSIS — M79601 Pain in right arm: Secondary | ICD-10-CM

## 2019-08-30 NOTE — Therapy (Signed)
[facility] [address], [place], [place], [address] Phone: [phone]   Fax:  [phone]  Physical Therapy Treatment  Patient Details  Name: Samantha [name] MRN: [id number] Date of Birth: Aug 10, 1960 Referring Provider (PT): Ferne Reus PA-C   Encounter Date: 08/30/2019  PT End of Session - 08/30/19 1614    Visit Number  2    Date for PT Re-Evaluation  09/28/19    PT Start Time  [id number]    PT Stop Time  1613   dry needling   PT Time Calculation (min)  42 min    Activity Tolerance  Patient tolerated treatment well    Behavior During Therapy  [facility] for tasks assessed/performed       Past Medical History:  Diagnosis Date  . Allergy   . Depression   . GERD (gastroesophageal reflux disease)   . Thyroid disease     Past Surgical History:  Procedure Laterality Date  . TONSILLECTOMY      There were no vitals filed for this visit.  Subjective Assessment - 08/30/19 1536    Subjective  Pt states that she has been scheduled for a nerve conduction study in June. There has not been any change since the eval.    Pertinent History  depression    Currently in Pain?  Yes    Pain Score  4     Pain Location  Thoracic    Pain Orientation  Right    Pain Descriptors / Indicators  Aching;Dull    Pain Type  Acute pain    Pain Radiating Towards  none    Pain Onset  More than a month ago    Pain Frequency  Constant    Aggravating Factors   alot of use of the Rt UE                        [facility] Adult PT Treatment/Exercise - 08/30/19 0001      Exercises   Exercises  Neck      Neck Exercises: Seated   Other Seated Exercise  cervical nod x5 reps       Neck Exercises: Supine   Capital Flexion  5 reps    Capital Flexion Limitations  head nod       Manual Therapy   Manual Therapy  Joint mobilization;Soft tissue mobilization    Joint Mobilization  CPAs T6, T5, T4    Soft tissue mobilization  STM Rt upper  trap, trigger point release Rt rhomboids       Trigger Point Dry Needling - 08/30/19 0001    Consent Given?  Yes    Education Handout Provided  Yes    Muscles Treated Head and Neck  Upper trapezius;Cervical multifidi    Upper Trapezius Response  Twitch reponse elicited;Palpable increased muscle length   Rt   Cervical multifidi Response  --   Rt: C6, C7, T1,T2,T3          PT Education - 08/30/19 1657    Education Details  technique with therex    Person(s) Educated  Patient    Methods  Explanation;Verbal cues    Comprehension  Verbalized understanding       PT Short Term Goals - 08/03/19 1610      PT SHORT TERM GOAL #1   Title  Pt wil demo consistency and independence with her initial HEP to improve ROM and strength.    Time  4  Period  Weeks    Status  New    Target Date  08/31/19        PT Long Term Goals - 08/03/19 1610      PT LONG TERM GOAL #1   Title  Pt will demo improved thoracic strength evident by her ability to actively rotate each direction atleast 45 deg.     Time  8    Period  Weeks    Status  New    Target Date  09/28/19      PT LONG TERM GOAL #2   Title  Pt will report atleast 75% improvement in her pain from the start of PT which will allow her to complete cleaning activity with less difficulty.    Time  8    Period  Weeks    Status  New      PT LONG TERM GOAL #3   Title  Pt to report no sx into RUE to improve her ability to carry and lift her grandchild  and perform work functions.    Baseline  --    Time  8    Period  Weeks    Status  New      PT LONG TERM GOAL #4   Title  Improved FOTO score to <= 36% limitations    Baseline  46% limited at eval      PT LONG TERM GOAL #5   Title  Patient to demo right shoulder ABD to [facility] with no UE tingling.    Time  8    Period  Weeks    Status  New            Plan - 08/30/19 1650    Clinical Impression Statement  Pt has had no change since the evaluation, which is expected due to  this being her first treatment. Pt has palpable trigger points in the upper thoracic and lower cervical paraspinals. She responded well to dry needling treatment to the area with several twitch responses noted. Pt reported improved pain with cervical movement at the end of today's session. She also demonstrated good understanding of cervical nodding added to her HEP.    Personal Factors and Comorbidities  Comorbidity 1    Comorbidities  depression    Examination-Activity Limitations  Caring for Others;Lift;Sleep    Stability/Clinical Decision Making  Evolving/Moderate complexity    Rehab Potential  Excellent    PT Frequency  2x / week    PT Duration  8 weeks    PT Treatment/Interventions  ADLs/Self Care Home Management;Cryotherapy;Electrical Stimulation;Moist Heat;Traction;Neuromuscular re-education;Therapeutic exercise;Therapeutic activities;Patient/family education;Manual techniques;Dry needling;Taping;Spinal Manipulations    PT Next Visit Plan  DN to cerv/thoracic multifidi, pectorals, UT as needed; spinal mobs/mobility; work on increasing mobility and flexibility before adding strengthening.    PT Home Exercise Plan  [email or web address]    Consulted and Agree with Plan of Care  Patient       Patient will benefit from skilled therapeutic intervention in order to improve the following deficits and impairments:  Decreased range of motion, Pain, Impaired UE functional use, Hypomobility, Impaired flexibility, Increased muscle spasms, Decreased strength  Visit Diagnosis: Pain in thoracic spine  Muscle spasm of back  Pain in right arm  Muscle weakness (generalized)     Problem List Patient Active Problem List   Diagnosis Date Noted  . Thoracic disc disorder 02/04/2018  . Inflammatory heel pain 02/22/2016  . Osteoarthritis, hand 02/15/2015  . Elbow pain, chronic, right 01/03/2015  .  Cervical radiculopathy at C8 03/21/2014  . Degenerative disc disease, cervical 08/18/2013  . Degenerative  arthritis of lumbar spine 08/18/2013  . Achilles tendinitis 01/06/2012  . HYPOTHYROIDISM 03/15/2009  . Plantar fasciitis, right 03/15/2009  . CAVUS DEFORMITY OF FOOT, ACQUIRED 03/15/2009   5:01 PM,08/30/19 Sherol Dade PT, DPT [place] [facility] [address] [place], [place], [address] Phone: [phone]   Fax:  [phone]  Name: Samantha [name] MRN: [id number] Date of Birth: [birth year]

## 2019-09-01 ENCOUNTER — Ambulatory Visit: Payer: Self-pay | Admitting: Physical Therapy

## 2019-09-01 ENCOUNTER — Encounter: Payer: Self-pay | Admitting: Physical Therapy

## 2019-09-01 ENCOUNTER — Other Ambulatory Visit: Payer: Self-pay

## 2019-09-01 DIAGNOSIS — M79601 Pain in right arm: Secondary | ICD-10-CM

## 2019-09-01 DIAGNOSIS — M62830 Muscle spasm of back: Secondary | ICD-10-CM

## 2019-09-01 DIAGNOSIS — M6281 Muscle weakness (generalized): Secondary | ICD-10-CM

## 2019-09-01 DIAGNOSIS — M546 Pain in thoracic spine: Secondary | ICD-10-CM

## 2019-09-01 NOTE — Therapy (Signed)
[facility] [address], [place], [place], [address] Phone: [phone]   Fax:  [phone]  Physical Therapy Treatment  Patient Details  Name: Samantha [name] MRN: [id number] Date of Birth: 05/26/1960 Referring Provider (PT): Ferne Reus PA-C   Encounter Date: 09/01/2019  PT End of Session - 09/01/19 1505    Visit Number  3    Date for PT Re-Evaluation  09/28/19    PT Start Time  [id number]    PT Stop Time  1529    PT Time Calculation (min)  42 min    Activity Tolerance  Patient tolerated treatment well    Behavior During Therapy  [facility] for tasks assessed/performed       Past Medical History:  Diagnosis Date  . Allergy   . Depression   . GERD (gastroesophageal reflux disease)   . Thyroid disease     Past Surgical History:  Procedure Laterality Date  . TONSILLECTOMY      There were no vitals filed for this visit.  Subjective Assessment - 09/01/19 1617    Subjective  Pt had increased soreness in her Rt upper trap following last session.    Pertinent History  depression    Currently in Pain?  Yes    Pain Score  4    Rt upper trap   Pain Onset  More than a month ago                        [facility] Adult PT Treatment/Exercise - 09/01/19 0001      Neck Exercises: Seated   Other Seated Exercise  cervical nod 10x3 sec hold       Neck Exercises: Supine   Capital Flexion  10 reps;3 secs    Capital Flexion Limitations  head nod and hold into physioball; nod with retraction on ball x10      Neck Exercises: Sidelying   Other Sidelying Exercise  thoracic rotation x15 reps each side       Manual Therapy   Soft tissue mobilization  trigger point release Rt rhomboid, Rt upper trap       Neck Exercises: Stretches   Other Neck Stretches  Rt median nerve flossing x10 reps, Rt radial nerve flossing x10 reps              PT Education - 09/01/19 1536    Education Details  technique with therex     Person(s) Educated  Patient    Methods  Explanation;Handout    Comprehension  Verbalized understanding       PT Short Term Goals - 08/03/19 1610      PT SHORT TERM GOAL #1   Title  Pt wil demo consistency and independence with her initial HEP to improve ROM and strength.    Time  4    Period  Weeks    Status  New    Target Date  08/31/19        PT Long Term Goals - 08/03/19 1610      PT LONG TERM GOAL #1   Title  Pt will demo improved thoracic strength evident by her ability to actively rotate each direction atleast 45 deg.     Time  8    Period  Weeks    Status  New    Target Date  09/28/19      PT LONG TERM GOAL #2   Title  Pt will report atleast 75%  improvement in her pain from the start of PT which will allow her to complete cleaning activity with less difficulty.    Time  8    Period  Weeks    Status  New      PT LONG TERM GOAL #3   Title  Pt to report no sx into RUE to improve her ability to carry and lift her grandchild  and perform work functions.    Baseline  --    Time  8    Period  Weeks    Status  New      PT LONG TERM GOAL #4   Title  Improved FOTO score to <= 36% limitations    Baseline  46% limited at eval      PT LONG TERM GOAL #5   Title  Patient to demo right shoulder ABD to [facility] with no UE tingling.    Time  8    Period  Weeks    Status  New            Plan - 09/01/19 1537    Clinical Impression Statement  Pt had some increase in soreness of the Rt upper trap following dry needling. She has positive upper limb tension testing today on the Rt so PT reviewed nerve flossing for this. Pt was able to demonstrate good understanding of deep neck flexor muscle activation in supine. She required PT cuing to improve technique. Ended with manual treatment to decrease muscle spasm of the upper trap and rhomboids. Pt reported improvement in pain following this.    Personal Factors and Comorbidities  Comorbidity 1    Comorbidities  depression     Examination-Activity Limitations  Caring for Others;Lift;Sleep    Stability/Clinical Decision Making  Evolving/Moderate complexity    Rehab Potential  Excellent    PT Frequency  2x / week    PT Duration  8 weeks    PT Treatment/Interventions  ADLs/Self Care Home Management;Cryotherapy;Electrical Stimulation;Moist Heat;Traction;Neuromuscular re-education;Therapeutic exercise;Therapeutic activities;Patient/family education;Manual techniques;Dry needling;Taping;Spinal Manipulations    PT Next Visit Plan  DN to cerv/thoracic multifidi, pectorals, UT as needed; spinal mobs/mobility; work on increasing mobility and flexibility before adding strengthening.    PT Home Exercise Plan  [email or web address]    Consulted and Agree with Plan of Care  Patient       Patient will benefit from skilled therapeutic intervention in order to improve the following deficits and impairments:  Decreased range of motion, Pain, Impaired UE functional use, Hypomobility, Impaired flexibility, Increased muscle spasms, Decreased strength  Visit Diagnosis: Pain in thoracic spine  Muscle spasm of back  Pain in right arm  Muscle weakness (generalized)     Problem List Patient Active Problem List   Diagnosis Date Noted  . Thoracic disc disorder 02/04/2018  . Inflammatory heel pain 02/22/2016  . Osteoarthritis, hand 02/15/2015  . Elbow pain, chronic, right 01/03/2015  . Cervical radiculopathy at C8 03/21/2014  . Degenerative disc disease, cervical 08/18/2013  . Degenerative arthritis of lumbar spine 08/18/2013  . Achilles tendinitis 01/06/2012  . HYPOTHYROIDISM 03/15/2009  . Plantar fasciitis, right 03/15/2009  . CAVUS DEFORMITY OF FOOT, ACQUIRED 03/15/2009    4:17 PM,09/01/19 Sherol Dade PT, DPT [place] [facility] [address] [place], [place], [address] Phone: [phone]   Fax:   [phone]  Name: Samantha [name] MRN: [id number] Date of Birth: [birth year]

## 2019-09-01 NOTE — Patient Instructions (Signed)
Access Code: [email or web address]: [email or web address]: 05/18/2021Prepared by: [facility]  Seated Rhomboid Stretch - 1 x daily - 7 x weekly - 3 reps - 1 sets - 20 hold  Seated Thoracic Lumbar Extension - 1 x daily - 7 x weekly - 10 reps - 1 sets  Supine Head Nod with Deep Neck Flexor Activation - 1 x daily - 7 x weekly - 10 reps - 3 sets  Median Nerve Flossing - Tray - 2 x daily - 7 x weekly - 10 reps - 3 seconds hold  Radial Nerve Flossing - 2 x daily - 7 x weekly - 10 reps - 3 seconds hold   [facility] [address], [place], [place] [address] Phone # [phone] Fax [phone]

## 2019-09-02 ENCOUNTER — Ambulatory Visit (INDEPENDENT_AMBULATORY_CARE_PROVIDER_SITE_OTHER): Payer: Self-pay

## 2019-09-02 DIAGNOSIS — J309 Allergic rhinitis, unspecified: Secondary | ICD-10-CM

## 2019-09-06 ENCOUNTER — Encounter: Payer: Self-pay | Admitting: Physical Therapy

## 2019-09-08 ENCOUNTER — Encounter: Payer: Self-pay | Admitting: Allergy

## 2019-09-08 ENCOUNTER — Other Ambulatory Visit: Payer: Self-pay

## 2019-09-08 ENCOUNTER — Ambulatory Visit (INDEPENDENT_AMBULATORY_CARE_PROVIDER_SITE_OTHER): Payer: Self-pay | Admitting: Allergy

## 2019-09-08 VITALS — BP 130/84 | HR 80 | Temp 98.4°F | Resp 18

## 2019-09-08 DIAGNOSIS — L299 Pruritus, unspecified: Secondary | ICD-10-CM

## 2019-09-08 DIAGNOSIS — J3089 Other allergic rhinitis: Secondary | ICD-10-CM

## 2019-09-08 NOTE — Patient Instructions (Signed)
Allergic rhinitis  - continue avoidance measures for mugwort (weed pollen), oak (tree pollen), molds.    -Doing well on allergen immunotherapy at maintenance dosing.  Continue at this time to complete a 3 to 5-year course.  -Continue Allegra 180mg  daily as needed and on days of your allergy injections  - Xhance, Xyzal and Singulair stopped due to adverse effects.  Itching  - avoidance measures as above  - continue use of clobetasol as directed by dermatology  - continue use of eucrisa as needed for itch/rash control  Follow-up 12 months or sooner if needed

## 2019-09-08 NOTE — Progress Notes (Signed)
Follow-up Note  RE: Samantha [name] MRN: [id number] DOB: Oct 16, 1960 Date of Office Visit: 09/08/2019   History of present illness: Samantha [name] is a 59 y.o. female presenting today for follow-up of allergic rhinitis.  She also has history of itching followed by dermatology.  She was last seen in the office on 05/05/2018 by myself.  She states she has been doing well over the past without any major health changes, surgeries or hospitalizations.  She states her allergy symptoms have done very well on immunotherapy.  She states this spring she has not had any significant symptoms.  She states at this point she is using her Allegra as needed and for the most part is only using it on the days of her allergy injections.  She is at maintenance dosing tolerating this well without any large local or systemic reactions.  She is set to space to every 3 weeks next spring. Since being on allergy shots she does feel that her itching has diminished some.  Is not completely gone but it is improved.  Review of systems: Review of Systems  Constitutional: Negative.   HENT: Negative.   Eyes: Negative.   Respiratory: Negative.   Cardiovascular: Negative.   Gastrointestinal: Negative.   Musculoskeletal: Negative.   Skin: Negative.   Neurological: Negative.     All other systems negative unless noted above in HPI  Past medical/social/surgical/family history have been reviewed and are unchanged unless specifically indicated below.  No changes  Medication List: Current Outpatient Medications  Medication Sig Dispense Refill  . Calcium Carb-Cholecalciferol (CALCIUM-VITAMIN D3) 250-125 MG-UNIT TABS Take 1 tablet once a day    . Cholecalciferol (VITAMIN D3) 1000 units CAPS Take 1,000 Units by mouth daily.    Marland Kitchen EPINEPHrine 0.3 mg/0.3 mL IJ SOAJ injection Inject 0.3 mLs (0.3 mg total) into the muscle as needed for anaphylaxis. 2 Device 1  . hydrocortisone 2.5 % ointment   1  . sertraline (ZOLOFT) 50 MG tablet Take  50 mg by mouth daily.    Marland Kitchen TIROSINT 88 MCG CAPS   10   No current facility-administered medications for this visit.     Known medication allergies: Allergies  Allergen Reactions  . Sulfonamide Derivatives Nausea And Vomiting  . Nortriptyline Other (See Comments)    depression  . Omeprazole Hives  . Singulair [Montelukast Sodium] Other (See Comments)    Depressed mood     Physical examination: Blood pressure 130/84, pulse 80, temperature 98.4 F (36.9 C), temperature source Temporal, resp. rate 18, SpO2 95 %.  General: Alert, interactive, in no acute distress. HEENT: PERRLA, TMs pearly gray, turbinates non-edematous without discharge, post-pharynx non erythematous. Neck: Supple without lymphadenopathy. Lungs: Clear to auscultation without wheezing, rhonchi or rales. {no increased work of breathing. CV: Normal S1, S2 without murmurs. Abdomen: Nondistended, nontender. Skin: Warm and dry, without lesions or rashes. Extremities:  No clubbing, cyanosis or edema. Neuro:   Grossly intact.  Diagnositics/Labs: None today  Assessment and plan: Patient Instructions  Allergic rhinitis  - continue avoidance measures for mugwort (weed pollen), oak (tree pollen), molds.    -Doing well on allergen immunotherapy at maintenance dosing.  Continue at this time to complete a 3 to 5-year course.  -Continue Allegra 180mg  daily as needed and on days of your allergy injections  - Xhance, Xyzal and Singulair stopped due to adverse effects.  Itching  - avoidance measures as above  - continue use of clobetasol as directed by dermatology  - continue use of  eucrisa as needed for itch/rash control  Follow-up 12 months or sooner if needed  I appreciate the opportunity to take part in Samantha [name]'s care. Please do not hesitate to contact me with questions.  Sincerely,   Prudy Feeler, MD Allergy/Immunology [facility] [place] [facility] [place]

## 2019-09-09 ENCOUNTER — Encounter: Payer: Self-pay | Admitting: Physical Therapy

## 2019-09-13 ENCOUNTER — Encounter: Payer: Self-pay | Admitting: Physical Therapy

## 2019-09-13 ENCOUNTER — Ambulatory Visit: Payer: Self-pay | Attending: Physician Assistant | Admitting: Physical Therapy

## 2019-09-13 ENCOUNTER — Other Ambulatory Visit: Payer: Self-pay

## 2019-09-13 ENCOUNTER — Ambulatory Visit (INDEPENDENT_AMBULATORY_CARE_PROVIDER_SITE_OTHER): Payer: Self-pay

## 2019-09-13 DIAGNOSIS — J3089 Other allergic rhinitis: Secondary | ICD-10-CM

## 2019-09-13 DIAGNOSIS — M79601 Pain in right arm: Secondary | ICD-10-CM | POA: Insufficient documentation

## 2019-09-13 DIAGNOSIS — M546 Pain in thoracic spine: Secondary | ICD-10-CM | POA: Insufficient documentation

## 2019-09-13 DIAGNOSIS — M6281 Muscle weakness (generalized): Secondary | ICD-10-CM | POA: Insufficient documentation

## 2019-09-13 DIAGNOSIS — M62830 Muscle spasm of back: Secondary | ICD-10-CM | POA: Insufficient documentation

## 2019-09-13 NOTE — Therapy (Signed)
[facility] [address] [place], [place], [address] Phone: [phone]   Fax:  [phone]  Physical Therapy Treatment  Patient Details  Name: Samantha [name] MRN: [id number] Date of Birth: 28-Jul-1960 Referring Provider (PT): Ferne Reus PA-C   Encounter Date: 09/13/2019  PT End of Session - 09/13/19 1546    Visit Number  4    Date for PT Re-Evaluation  09/28/19    PT Start Time  1525    PT Stop Time  1609    PT Time Calculation (min)  44 min    Activity Tolerance  Patient tolerated treatment well;No increased pain    Behavior During Therapy  [facility] for tasks assessed/performed       Past Medical History:  Diagnosis Date  . Allergy   . Depression   . GERD (gastroesophageal reflux disease)   . Thyroid disease     Past Surgical History:  Procedure Laterality Date  . TONSILLECTOMY      There were no vitals filed for this visit.  Subjective Assessment - 09/13/19 1527    Subjective  Pt states that she continues to have Rt UE/hand tingling. She is working on her HEP without issues.    Pertinent History  depression    Currently in Pain?  No/denies    Pain Onset  More than a month ago                        [facility] Adult PT Treatment/Exercise - 09/13/19 0001      Neck Exercises: Supine   Neck Retraction  10 reps;3 secs    Neck Retraction Limitations  head nod prior     Capital Flexion  10 reps;3 secs    Capital Flexion Limitations  head nod and hold     Other Supine Exercise  pec stretch over foam roll in various positions x30 sec each       Manual Therapy   Manual therapy comments  Rt upper trap STM during dry needling     Joint Mobilization  Rt CPAs grade III-IV x2 bouts 30 sec T1, C7, C6    Soft tissue mobilization  Rt upper trap stretch x30 sec        Trigger Point Dry Needling - 09/13/19 0001    Consent Given?  Yes    Education Handout Provided  Previously provided    Upper  Trapezius Response  Twitch reponse elicited;Palpable increased muscle length   Rt           PT Education - 09/13/19 1610    Education Details  technique with therex    Person(s) Educated  Patient    Methods  Explanation;Verbal cues    Comprehension  Verbalized understanding       PT Short Term Goals - 08/03/19 1610      PT SHORT TERM GOAL #1   Title  Pt wil demo consistency and independence with her initial HEP to improve ROM and strength.    Time  4    Period  Weeks    Status  New    Target Date  08/31/19        PT Long Term Goals - 08/03/19 1610      PT LONG TERM GOAL #1   Title  Pt will demo improved thoracic strength evident by her ability to actively rotate each direction atleast 45 deg.     Time  8    Period  Weeks    Status  New    Target Date  09/28/19      PT LONG TERM GOAL #2   Title  Pt will report atleast 75% improvement in her pain from the start of PT which will allow her to complete cleaning activity with less difficulty.    Time  8    Period  Weeks    Status  New      PT LONG TERM GOAL #3   Title  Pt to report no sx into RUE to improve her ability to carry and lift her grandchild  and perform work functions.    Baseline  --    Time  8    Period  Weeks    Status  New      PT LONG TERM GOAL #4   Title  Improved FOTO score to <= 36% limitations    Baseline  46% limited at eval      PT LONG TERM GOAL #5   Title  Patient to demo right shoulder ABD to [facility] with no UE tingling.    Time  8    Period  Weeks    Status  New            Plan - 09/13/19 1615    Clinical Impression Statement  Pt is noticing minimal change in the UE tingling despite therapist and pt efforts. Pt has restricted cervical spine mobility, which PT addressed today. Pt had pain free active cervical Rt rotation and extension following mobilization with movement. Pain was decreased in the 1st two digits with manual cervical traction but was increased over the base of the 5th  digit. Pt could benefit from trial of mechanical traction, so PT will consider this next visit. No increase in symptoms was noted end of today's session.    Personal Factors and Comorbidities  Comorbidity 1    Comorbidities  depression    Examination-Activity Limitations  Caring for Others;Lift;Sleep    Stability/Clinical Decision Making  Evolving/Moderate complexity    Rehab Potential  Excellent    PT Frequency  2x / week    PT Duration  8 weeks    PT Treatment/Interventions  ADLs/Self Care Home Management;Cryotherapy;Electrical Stimulation;Moist Heat;Traction;Neuromuscular re-education;Therapeutic exercise;Therapeutic activities;Patient/family education;Manual techniques;Dry needling;Taping;Spinal Manipulations    PT Next Visit Plan  possible traction; DN to cerv/thoracic multifidi, pectorals, UT as needed; spinal mobs/mobility; work on increasing mobility and flexibility before adding strengthening.    PT Home Exercise Plan  [email or web address]    Consulted and Agree with Plan of Care  Patient       Patient will benefit from skilled therapeutic intervention in order to improve the following deficits and impairments:  Decreased range of motion, Pain, Impaired UE functional use, Hypomobility, Impaired flexibility, Increased muscle spasms, Decreased strength  Visit Diagnosis: Pain in thoracic spine  Muscle spasm of back  Pain in right arm  Muscle weakness (generalized)     Problem List Patient Active Problem List   Diagnosis Date Noted  . Thoracic disc disorder 02/04/2018  . Inflammatory heel pain 02/22/2016  . Osteoarthritis, hand 02/15/2015  . Elbow pain, chronic, right 01/03/2015  . Cervical radiculopathy at C8 03/21/2014  . Degenerative disc disease, cervical 08/18/2013  . Degenerative arthritis of lumbar spine 08/18/2013  . Achilles tendinitis 01/06/2012  . HYPOTHYROIDISM 03/15/2009  . Plantar fasciitis, right 03/15/2009  . CAVUS DEFORMITY OF FOOT, ACQUIRED 03/15/2009   4:58  PM,09/13/19 Sherol Dade PT, DPT [place] at  [facility] [address] [place], [place], [address] Phone: [phone]   Fax:  [phone]  Name: Samantha [name] MRN: [id number] Date of Birth: 1960/05/11

## 2019-09-15 ENCOUNTER — Encounter: Payer: Self-pay | Admitting: Physical Therapy

## 2019-09-15 ENCOUNTER — Other Ambulatory Visit: Payer: Self-pay

## 2019-09-15 DIAGNOSIS — M5412 Radiculopathy, cervical region: Secondary | ICD-10-CM

## 2019-09-19 ENCOUNTER — Encounter: Payer: Self-pay | Admitting: Neurology

## 2019-09-19 ENCOUNTER — Ambulatory Visit: Payer: Self-pay | Admitting: Neurology

## 2019-09-19 ENCOUNTER — Ambulatory Visit (INDEPENDENT_AMBULATORY_CARE_PROVIDER_SITE_OTHER): Payer: Self-pay | Admitting: Neurology

## 2019-09-19 DIAGNOSIS — M5412 Radiculopathy, cervical region: Secondary | ICD-10-CM

## 2019-09-19 DIAGNOSIS — G5600 Carpal tunnel syndrome, unspecified upper limb: Secondary | ICD-10-CM | POA: Insufficient documentation

## 2019-09-19 DIAGNOSIS — G5601 Carpal tunnel syndrome, right upper limb: Secondary | ICD-10-CM

## 2019-09-19 HISTORY — DX: Carpal tunnel syndrome, right upper limb: G56.01

## 2019-09-19 NOTE — Procedures (Signed)
     HISTORY:  Samantha [name] is a 59 year old patient with a 8-month history of some problems with numbness in the right hand.  The patient does have a history of neck pain as well.  She denies any symptoms with the left hand.  She is being evaluated for a possible neuropathy or a cervical radiculopathy.   NERVE CONDUCTION STUDIES:  Nerve conduction studies were performed on both upper extremities.  The distal motor latencies for the median nerves were prolonged on the right, normal on the left, and normal for the ulnar nerves bilaterally.  The motor amplitude for the right median nerve was slightly low, but was normal for the left median nerve and for the ulnar nerves bilaterally.  The nerve conduction velocities for the median and ulnar nerves were within normal limits bilaterally.  The sensory latencies for the median nerves were prolonged on the right, normal on the left, and normal for the ulnar nerves bilaterally.  The ulnar F-wave latencies were within normal limits bilaterally.  EMG STUDIES:  EMG study was performed on the right upper extremity:  The first dorsal interosseous muscle reveals 2 to 4 K units with full recruitment. No fibrillations or positive waves were noted. The abductor pollicis brevis muscle reveals 2 to 4 K units with slightly reduced recruitment. No fibrillations or positive waves were noted. The extensor indicis proprius muscle reveals 1 to 3 K units with full recruitment. No fibrillations or positive waves were noted. The pronator teres muscle reveals 2 to 3 K units with full recruitment. No fibrillations or positive waves were noted. The biceps muscle reveals 1 to 2 K units with full recruitment. No fibrillations or positive waves were noted. The triceps muscle reveals 2 to 4 K units with full recruitment. No fibrillations or positive waves were noted. The anterior deltoid muscle reveals 2 to 3 K units with full recruitment. No fibrillations or positive waves were  noted. The cervical paraspinal muscles were tested at 2 levels. No abnormalities of insertional activity were seen at either level tested. There was good relaxation.   IMPRESSION:  Nerve conduction studies done on both upper extremities shows evidence of a right carpal tunnel syndrome of moderate severity.  EMG evaluation of the right upper extremity was relatively unremarkable, there is no evidence of an overlying cervical radiculopathy.  Jill Alexanders MD 09/19/2019 2:33 PM  [facility] [address] [place], [place] [address]  Phone [phone] Fax [phone]

## 2019-09-19 NOTE — Progress Notes (Signed)
[place]    Nerve / Sites Muscle Latency Ref. Amplitude Ref. Rel Amp Segments Distance Velocity Ref. Area    ms ms mV mV %  cm m/s m/s mVms  L Median - APB     Wrist APB 3.2 ?4.4 6.2 ?4.0 100 Wrist - APB 7   20.0     Upper arm APB 6.9  6.1  98.7 Upper arm - Wrist 22 60 ?49 20.5  R Median - APB     Wrist APB 4.8 ?4.4 3.1 ?4.0 100 Wrist - APB 7   11.7     Upper arm APB 8.9  3.1  98.9 Upper arm - Wrist 22 54 ?49 15.0  L Ulnar - ADM     Wrist ADM 2.4 ?3.3 13.5 ?6.0 100 Wrist - ADM 7   37.0     B.Elbow ADM 5.5  12.6  93.6 B.Elbow - Wrist 20 66 ?49 36.6     A.Elbow ADM 7.0  12.7  100 A.Elbow - B.Elbow 10 65 ?49 36.4  R Ulnar - ADM     Wrist ADM 2.3 ?3.3 12.2 ?6.0 100 Wrist - ADM 7   35.2     B.Elbow ADM 5.3  11.5  94.2 B.Elbow - Wrist 21 68 ?49 34.5     A.Elbow ADM 6.9  11.0  95.9 A.Elbow - B.Elbow 10 66 ?49 33.6             [facility]    Nerve / Sites Rec. Site Peak Lat Ref.  Amp Ref. Segments Distance    ms ms V V  cm  L Median - Orthodromic (Dig II, Mid palm)     Dig II Wrist 2.8 ?3.4 25 ?10 Dig II - Wrist 13  R Median - Orthodromic (Dig II, Mid palm)     Dig II Wrist 4.0 ?3.4 11 ?10 Dig II - Wrist 13  L Ulnar - Orthodromic, (Dig V, Mid palm)     Dig V Wrist 2.5 ?3.1 14 ?5 Dig V - Wrist 11  R Ulnar - Orthodromic, (Dig V, Mid palm)     Dig V Wrist 2.5 ?3.1 11 ?5 Dig V - Wrist 102             F  Wave    Nerve F Lat Ref.   ms ms  L Ulnar - ADM 24.3 ?32.0  R Ulnar - ADM 26.1 ?32.0

## 2019-09-19 NOTE — Progress Notes (Signed)
Please refer to EMG and nerve conduction procedure note.  

## 2019-09-20 ENCOUNTER — Ambulatory Visit: Payer: Self-pay | Admitting: Physical Therapy

## 2019-09-20 ENCOUNTER — Encounter: Payer: Self-pay | Admitting: Physical Therapy

## 2019-09-20 ENCOUNTER — Other Ambulatory Visit: Payer: Self-pay

## 2019-09-20 DIAGNOSIS — M6281 Muscle weakness (generalized): Secondary | ICD-10-CM

## 2019-09-20 DIAGNOSIS — M546 Pain in thoracic spine: Secondary | ICD-10-CM

## 2019-09-20 DIAGNOSIS — M79601 Pain in right arm: Secondary | ICD-10-CM

## 2019-09-20 DIAGNOSIS — M62830 Muscle spasm of back: Secondary | ICD-10-CM

## 2019-09-20 NOTE — Therapy (Signed)
[facility] [address], [place], [place], [address] Phone: [phone]   Fax:  [phone]  Physical Therapy Treatment  Patient Details  Name: Samantha [name] MRN: [id number] Date of Birth: 06-07-1960 Referring Provider (PT): Ferne Reus PA-C   Encounter Date: 09/20/2019  PT End of Session - 09/20/19 1712    Visit Number  5    Date for PT Re-Evaluation  09/28/19    PT Start Time  1532    PT Stop Time  1612    PT Time Calculation (min)  40 min    Activity Tolerance  Patient tolerated treatment well;No increased pain    Behavior During Therapy  WFL for tasks assessed/performed       Past Medical History:  Diagnosis Date  . Allergy   . Depression   . GERD (gastroesophageal reflux disease)   . Right carpal tunnel syndrome 09/19/2019  . Thyroid disease     Past Surgical History:  Procedure Laterality Date  . TONSILLECTOMY      There were no vitals filed for this visit.  Subjective Assessment - 09/20/19 1533    Subjective  Pt states that things are going well. She had her nerve conduction test and was told she has carpal tunnel.    Pertinent History  depression    Currently in Pain?  No/denies    Pain Onset  More than a month ago                        [facility] Adult PT Treatment/Exercise - 09/20/19 0001      Self-Care   Self-Care  ADL's    ADL's  activity modification to avoid repetitive wrist use on the Rt       Exercises   Exercises  Wrist      Neck Exercises: Standing   Other Standing Exercises  rows with blue TB x10 reps HEP demo, PT cuing to avoid wrist flexion compensation      Neck Exercises: Seated   Other Seated Exercise  shoulder ER with elbows by side, red TB 2x10 reps       Wrist Exercises   Other wrist exercises  Rt wrist extension stretch elbow flexed and extended x10 sec each for HEP demo    Other wrist exercises  prayer wrist extension stretch HEP demo       Manual  Therapy   Joint Mobilization  Rt wrist PA mobilization grade III-IV x3 bouts     Soft tissue mobilization  STM Rt wrist extensors/flexors, Rt thumb              PT Education - 09/20/19 1710    Education Details  technique with therex; updates to HEP    Person(s) Educated  Patient    Methods  Explanation;Verbal cues;Demonstration;Handout    Comprehension  Verbalized understanding;Returned demonstration       PT Short Term Goals - 08/03/19 1610      PT SHORT TERM GOAL #1   Title  Pt wil demo consistency and independence with her initial HEP to improve ROM and strength.    Time  4    Period  Weeks    Status  New    Target Date  08/31/19        PT Long Term Goals - 08/03/19 1610      PT LONG TERM GOAL #1   Title  Pt will demo improved thoracic strength evident by her ability  to actively rotate each direction atleast 45 deg.     Time  8    Period  Weeks    Status  New    Target Date  09/28/19      PT LONG TERM GOAL #2   Title  Pt will report atleast 75% improvement in her pain from the start of PT which will allow her to complete cleaning activity with less difficulty.    Time  8    Period  Weeks    Status  New      PT LONG TERM GOAL #3   Title  Pt to report no sx into RUE to improve her ability to carry and lift her grandchild  and perform work functions.    Baseline  --    Time  8    Period  Weeks    Status  New      PT LONG TERM GOAL #4   Title  Improved FOTO score to <= 36% limitations    Baseline  46% limited at eval      PT LONG TERM GOAL #5   Title  Patient to demo right shoulder ABD to [facility] with no UE tingling.    Time  8    Period  Weeks    Status  New            Plan - 09/20/19 1614    Clinical Impression Statement  Pt continues to have complaints of Rt thumb tingling and pain with daily activity. PT discussed activity modification and reviewed previous HEP to encourage increases in shoulder strength. Pt has restricted Rt wrist mobility,  likely contributing to carpal tunnel symptoms and PT completed manual treatment to the area following by HEP updates for the pt to continue working on this. Pt lacks 15 deg of wrist extension on the Rt compared to the Lt. Pt reported feeling better end of session. Will continue to address postural strength, activity modification and address mobility and strength restrictions in the Rt UE to improve her ability to care for her granddaughter and complete other ADLs without limitation.    Personal Factors and Comorbidities  Comorbidity 1    Comorbidities  depression    Examination-Activity Limitations  Caring for Others;Lift;Sleep    Stability/Clinical Decision Making  Evolving/Moderate complexity    Rehab Potential  Excellent    PT Frequency  2x / week    PT Duration  8 weeks    PT Treatment/Interventions  ADLs/Self Care Home Management;Cryotherapy;Electrical Stimulation;Moist Heat;Traction;Neuromuscular re-education;Therapeutic exercise;Therapeutic activities;Patient/family education;Manual techniques;Dry needling;Taping;Spinal Manipulations    PT Next Visit Plan  D/N thumb; f/u on HEP with wrist additions    PT Home Exercise Plan  [id number]; [id number]    Consulted and Agree with Plan of Care  Patient       Patient will benefit from skilled therapeutic intervention in order to improve the following deficits and impairments:  Decreased range of motion, Pain, Impaired UE functional use, Hypomobility, Impaired flexibility, Increased muscle spasms, Decreased strength  Visit Diagnosis: Pain in thoracic spine  Muscle spasm of back  Pain in right arm  Muscle weakness (generalized)     Problem List Patient Active Problem List   Diagnosis Date Noted  . Right carpal tunnel syndrome 09/19/2019  . Thoracic disc disorder 02/04/2018  . Inflammatory heel pain 02/22/2016  . Osteoarthritis, hand 02/15/2015  . Elbow pain, chronic, right 01/03/2015  . Cervical radiculopathy at C8 03/21/2014  .  Degenerative disc disease, cervical 08/18/2013  .  Degenerative arthritis of lumbar spine 08/18/2013  . Achilles tendinitis 01/06/2012  . HYPOTHYROIDISM 03/15/2009  . Plantar fasciitis, right 03/15/2009  . CAVUS DEFORMITY OF FOOT, ACQUIRED 03/15/2009   5:16 PM,09/20/19 Sherol Dade PT, DPT [facility] [place] [facility]  [facility] [address], [place], [place], [address] Phone: [phone]   Fax:  [phone]  Name: Jenavie Stanczak MRN: [id number] Date of Birth: 26-Nov-1960

## 2019-09-22 ENCOUNTER — Encounter: Payer: Self-pay | Admitting: Physical Therapy

## 2019-09-27 ENCOUNTER — Ambulatory Visit (INDEPENDENT_AMBULATORY_CARE_PROVIDER_SITE_OTHER): Payer: Self-pay | Admitting: Sports Medicine

## 2019-09-27 ENCOUNTER — Other Ambulatory Visit: Payer: Self-pay

## 2019-09-27 ENCOUNTER — Encounter: Payer: Self-pay | Admitting: Physical Therapy

## 2019-09-27 ENCOUNTER — Ambulatory Visit (INDEPENDENT_AMBULATORY_CARE_PROVIDER_SITE_OTHER): Payer: Self-pay

## 2019-09-27 VITALS — BP 120/80 | Ht 67.0 in | Wt 178.0 lb

## 2019-09-27 DIAGNOSIS — G5601 Carpal tunnel syndrome, right upper limb: Secondary | ICD-10-CM

## 2019-09-27 DIAGNOSIS — J309 Allergic rhinitis, unspecified: Secondary | ICD-10-CM

## 2019-09-28 NOTE — Progress Notes (Signed)
Patient ID: Samantha [name], female   DOB: 04-07-1961, 59 y.o.   MRN: [id number]  Wendell comes in today to discuss EMG findings of her right upper extremity.  EMG shows moderate carpal tunnel syndrome of the right wrist.  No evidence of cervical radiculopathy.  She continues to struggle with numbness in her right thumb.  Some slight weakness as well.  Based on these findings I have recommended a referral to hand surgery to discuss merits of carpal tunnel release.  She is currently without insurance so she would like to wait on that for now.  In the meantime, she is currently working with a physical therapist for neck pain and have asked that she request that they start some physical therapy for her carpal tunnel syndrome.  When she is ready for a surgical referral she will notify me.

## 2019-09-29 ENCOUNTER — Ambulatory Visit: Payer: Self-pay | Admitting: Physical Therapy

## 2019-09-29 ENCOUNTER — Other Ambulatory Visit: Payer: Self-pay

## 2019-09-29 DIAGNOSIS — M79601 Pain in right arm: Secondary | ICD-10-CM

## 2019-09-29 DIAGNOSIS — M62830 Muscle spasm of back: Secondary | ICD-10-CM

## 2019-09-29 DIAGNOSIS — M546 Pain in thoracic spine: Secondary | ICD-10-CM

## 2019-09-29 NOTE — Therapy (Signed)
[facility] [address], [address] [place], [place], [address] Phone: [phone]   Fax:  [phone]  Physical Therapy Treatment  Patient Details  Name: Samantha [name] MRN: [id number] Date of Birth: 03/24/1961 Referring Provider (PT): Vincent Costella PA-C   Encounter Date: 09/29/2019   PT End of Session - 09/29/19 1713    Visit Number 6    Date for PT Re-Evaluation 11/04/19    Authorization Time Period 09/29/19 to 11/04/19    PT Start Time 1533    PT Stop Time 1615    PT Time Calculation (min) 42 min    Activity Tolerance Patient tolerated treatment well;No increased pain    Behavior During Therapy WFL for tasks assessed/performed           Past Medical History:  Diagnosis Date  . Allergy   . Depression   . GERD (gastroesophageal reflux disease)   . Right carpal tunnel syndrome 09/19/2019  . Thyroid disease     Past Surgical History:  Procedure Laterality Date  . TONSILLECTOMY      There were no vitals filed for this visit.       [facility] PT Assessment - 09/29/19 0001      Assessment   Medical Diagnosis chronic Rt thoracic back pain    Referring Provider (PT) Vincent Costella PA-C    Onset Date/Surgical Date 01/13/19    Hand Dominance Right    Prior Therapy yes- successful      Precautions   Precautions None      Restrictions   Weight Bearing Restrictions No      Home Environment   Living Environment Private residence      Prior Function   Level of Independence Independent    Vocation Part time employment    Vocation Requirements cleaning    Leisure taking care of grandchild      Observation/Other Assessments   Focus on Therapeutic Outcomes (FOTO)  --      AROM   Overall AROM Comments no tingling with shoulder abduction    Cervical Flexion full    Cervical Extension full    Cervical - Right Side Bend 36    Cervical - Left Side Bend 35    Cervical - Right Rotation full    Cervical - Left  Rotation full      Strength   Overall Strength Comments RUE 5/5 (shoulder/elbow/wrist)      Flexibility   Soft Tissue Assessment /Muscle Length --   tight in bil pectorals, UT, paraspinals     Palpation   Spinal mobility --    Palpation comment tenderness Rt wrist extensor group      Special Tests   Other special tests --                         [facility] Adult PT Treatment/Exercise - 09/29/19 0001      Exercises   Exercises Hand      Hand Exercises   Other Hand Exercises Rt thumb opposition stretch 2x5 sec hold with elbow/wrist in slight flexion     Other Hand Exercises Rt median nerve flossing x2 reps HEP demo       Manual Therapy   Soft tissue mobilization STM Rt wrist extensors/flexors, Rt thumb                   PT Education - 09/29/19 1719    Education Details technique with therex      Person(s) Educated Patient    Methods Explanation;Handout    Comprehension Verbalized understanding;Returned demonstration            PT Short Term Goals - 09/29/19 1715      PT SHORT TERM GOAL #1   Title Pt wil demo consistency and independence with her initial HEP to improve ROM and strength.    Time 4    Period Weeks    Status Achieved    Target Date 08/31/19             PT Long Term Goals - 09/29/19 1715      PT LONG TERM GOAL #1   Title Pt will demo improved thoracic strength evident by her ability to actively rotate each direction atleast 45 deg.     Time 8    Period Weeks    Status Achieved      PT LONG TERM GOAL #2   Title Pt will report atleast 75% improvement in her pain from the start of PT which will allow her to complete cleaning activity with less difficulty.    Baseline 25%    Time 8    Period Weeks    Status On-going      PT LONG TERM GOAL #3   Title Pt to report no sx into RUE to improve her ability to carry and lift her grandchild  and perform work functions.    Baseline improves after manual to Rt wrist extensors/thumb     Time 8    Period Weeks    Status Partially Met      PT LONG TERM GOAL #4   Title Improved FOTO score to <= 36% limitations    Baseline 46% limited at eval      PT LONG TERM GOAL #5   Title Patient to demo right shoulder ABD to WFL with no UE tingling.    Time 8    Period Weeks    Status Achieved                 Plan - 09/29/19 1720    Clinical Impression Statement Pt is making progress towards her goals. She has improved cervical pain throughout the day but continues to have Rt thumb tingling with caring for her grandchild. She has found some relief with manual techniques to decrease trigger points in the wrist extensors, but it returns by the end of the day with use of the hands. Pt has had EMG test indicating carpal tunnel syndrome. She was recently encouraged to take a week off from caring for her grandchild and has started wearing a splint at night. She would continue to benefit from skilled PT to address muscle spasm of the Rt forearm/hand and increase Rt shoulder strength and thoracic/cervical mobility to improve mechanics with daily activity.    Personal Factors and Comorbidities Comorbidity 1    Comorbidities depression    Examination-Activity Limitations Caring for Others;Lift;Sleep    Stability/Clinical Decision Making Evolving/Moderate complexity    Rehab Potential Excellent    PT Frequency 2x / week    PT Duration 8 weeks    PT Treatment/Interventions ADLs/Self Care Home Management;Cryotherapy;Electrical Stimulation;Moist Heat;Traction;Neuromuscular re-education;Therapeutic exercise;Therapeutic activities;Patient/family education;Manual techniques;Dry needling;Taping;Spinal Manipulations    PT Next Visit Plan D/N thumb; f/u on HEP and splint use; shoulder strength progression/cervical mobility with towel    PT Home Exercise Plan [id number]; [id number]    Consulted and Agree with Plan of Care Patient             Patient will benefit from skilled therapeutic intervention  in order to improve the following deficits and impairments:  Decreased range of motion, Pain, Impaired UE functional use, Hypomobility, Impaired flexibility, Increased muscle spasms, Decreased strength  Visit Diagnosis: Pain in thoracic spine  Muscle spasm of back  Pain in right arm     Problem List Patient Active Problem List   Diagnosis Date Noted  . Right carpal tunnel syndrome 09/19/2019  . Thoracic disc disorder 02/04/2018  . Inflammatory heel pain 02/22/2016  . Osteoarthritis, hand 02/15/2015  . Elbow pain, chronic, right 01/03/2015  . Cervical radiculopathy at C8 03/21/2014  . Degenerative disc disease, cervical 08/18/2013  . Degenerative arthritis of lumbar spine 08/18/2013  . Achilles tendinitis 01/06/2012  . HYPOTHYROIDISM 03/15/2009  . Plantar fasciitis, right 03/15/2009  . CAVUS DEFORMITY OF FOOT, ACQUIRED 03/15/2009    5:26 PM,09/29/19 Sara Costella PT, DPT [facility]  [phone]  [facility] [address], [address] [place], [place], [address] Phone: [phone]   Fax:  [phone]  Name: Samantha [name] MRN: [id number] Date of Birth: 03/16/1961   

## 2019-10-03 MED ORDER — DICLOFENAC SODIUM 1 % EX GEL: 2.0000 g | Freq: Four times a day (QID) | CUTANEOUS | 3 refills | Status: DC

## 2019-10-03 NOTE — Telephone Encounter (Signed)
OK to do this 

## 2019-10-04 ENCOUNTER — Encounter: Payer: Self-pay | Admitting: Physical Therapy

## 2019-10-06 ENCOUNTER — Other Ambulatory Visit: Payer: Self-pay

## 2019-10-06 ENCOUNTER — Encounter: Payer: Self-pay | Admitting: Physical Therapy

## 2019-10-06 ENCOUNTER — Ambulatory Visit: Payer: Self-pay | Admitting: Physical Therapy

## 2019-10-06 DIAGNOSIS — M62830 Muscle spasm of back: Secondary | ICD-10-CM

## 2019-10-06 DIAGNOSIS — M546 Pain in thoracic spine: Secondary | ICD-10-CM

## 2019-10-06 DIAGNOSIS — M79601 Pain in right arm: Secondary | ICD-10-CM

## 2019-10-06 DIAGNOSIS — M6281 Muscle weakness (generalized): Secondary | ICD-10-CM

## 2019-10-06 NOTE — Therapy (Addendum)
[facility] [address], [place], [place], [address] Phone: [phone]   Fax:  [phone]  Physical Therapy Treatment/Discharge  Patient Details  Name: Samantha [name] MRN: [id number] Date of Birth: 11/29/1960 Referring Provider (PT): Ferne Reus PA-C   Encounter Date: 10/06/2019   PT End of Session - 10/06/19 1723    Visit Number 7    Date for PT Re-Evaluation 11/04/19    Authorization Time Period 09/29/19 to 11/04/19    PT Start Time 1537    PT Stop Time 4166    PT Time Calculation (min) 38 min    Activity Tolerance Patient tolerated treatment well;No increased pain    Behavior During Therapy WFL for tasks assessed/performed           Past Medical History:  Diagnosis Date  . Allergy   . Depression   . GERD (gastroesophageal reflux disease)   . Right carpal tunnel syndrome 09/19/2019  . Thyroid disease     Past Surgical History:  Procedure Laterality Date  . TONSILLECTOMY      There were no vitals filed for this visit.   Subjective Assessment - 10/06/19 1539    Subjective Pt states that the tingling is still present. She has some burning and tingling following her last session    Pertinent History depression    Pain Onset More than a month ago                             [facility] PT Treatment/Exercise - 10/06/19 0001      Neck Exercises: Sidelying   Other Sidelying Exercise thoracic rotation Rt x15 reps       Hand Exercises   MCPJ Extension Right;PROM;5 reps    MCPJ Extension Limitations 10 sec hold     Tendon Glides Rt hand x3 rounds: 3 sec hold each position    Other Hand Exercises Rt hand median nerve glide x2 reps      Manual Therapy   Joint Mobilization Rt wrist AP moblization grade III x2 bouts     Soft tissue mobilization STM Rt adductor pollicis/brevis            Trigger Point Dry Needling - 10/06/19 0001    Consent Given? Yes    Education Handout Provided  Previously provided    Other Dry Needling Rt adductor pollicis/brevis (+) twitch response noted                 PT Education - 10/06/19 1723    Education Details technique with therex    Person(s) Educated Patient    Methods Explanation;Verbal cues    Comprehension Verbalized understanding;Returned demonstration            PT Short Term Goals - 09/29/19 1715      PT SHORT TERM GOAL #1   Title Pt wil demo consistency and independence with her initial HEP to improve ROM and strength.    Time 4    Period Weeks    Status Achieved    Target Date 08/31/19             PT Long Term Goals - 09/29/19 1715      PT LONG TERM GOAL #1   Title Pt will demo improved thoracic strength evident by her ability to actively rotate each direction atleast 45 deg.     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    Baseline 25%    Time 8    Period Weeks    Status On-going      PT LONG TERM GOAL #3   Title Pt to report no sx into RUE to improve her ability to carry and lift her grandchild  and perform work functions.    Baseline improves after manual to Rt wrist extensors/thumb    Time 8    Period Weeks    Status Partially Met      PT LONG TERM GOAL #4   Title Improved FOTO score to <= 36% limitations    Baseline 46% limited at eval      PT LONG TERM GOAL #5   Title Patient to demo right shoulder ABD to [facility] with no UE tingling.    Time 8    Period Weeks    Status Achieved                 Plan - 10/06/19 1724    Clinical Impression Statement Pt had little to no change in Rt hand tingling following last session. She is completing her HEP regularly without increase in symptoms/pain. Pt increased use of her night splint and is taking a break from caring for her granddaughter this upcoming week. PT  completed dry needling to the Rt thumb with multiple twitch responses noted. Pt's HEP was updated with finger tendon and median nerve glides. She demonstrated understanding of this. Will continue with current POC.    Personal Factors and Comorbidities Comorbidity 1    Comorbidities depression    Examination-Activity Limitations Caring for Others;Lift;Sleep    Stability/Clinical Decision Making Evolving/Moderate complexity    Rehab Potential Excellent    PT Frequency 2x / week    PT Duration 8 weeks    PT Treatment/Interventions ADLs/Self Care Home Management;Cryotherapy;Electrical Stimulation;Moist Heat;Traction;Neuromuscular re-education;Therapeutic exercise;Therapeutic activities;Patient/family education;Manual techniques;Dry needling;Taping;Spinal Manipulations    PT Next Visit Plan f/u on HEP and splint use; nerve/tendon glides; shoulder strength progression/cervical mobility with towel    PT Home Exercise Plan [email or web address]    Consulted and Agree with Plan of Care Patient           Patient will benefit from skilled therapeutic intervention in order to improve the following deficits and impairments:  Decreased range of motion, Pain, Impaired UE functional use, Hypomobility, Impaired flexibility, Increased muscle spasms, Decreased strength  Visit Diagnosis: Pain in thoracic spine  Muscle spasm of back  Pain in right arm  Muscle weakness (generalized)     Problem List Patient Active Problem List   Diagnosis Date Noted  . Right carpal tunnel syndrome 09/19/2019  . Thoracic disc disorder 02/04/2018  . Inflammatory heel pain 02/22/2016  . Osteoarthritis, hand 02/15/2015  . Elbow pain, chronic, right 01/03/2015  . Cervical radiculopathy at C8 03/21/2014  . Degenerative disc disease, cervical 08/18/2013  . Degenerative arthritis of lumbar spine 08/18/2013  . Achilles tendinitis 01/06/2012  . HYPOTHYROIDISM 03/15/2009  . Plantar fasciitis, right 03/15/2009  . CAVUS DEFORMITY OF  FOOT, ACQUIRED 03/15/2009    5:33 PM,10/06/19 Sherol Dade PT, DPT [place] [facility] [place]  [facility] [address] [place], [place], [address] Phone: [phone]   Fax:  [phone]  Name: Samantha [name] MRN: [id number] Date of Birth: 16-Mar-1961    *addendum to resolve episode of care and d/c pt from PT   [place]  Visits from Start of Care: 7  Current functional level related to goals / functional outcomes: See above for more details    Remaining deficits: See above for more details    Education / Equipment: See above for more details   Plan: Patient agrees to discharge.  Patient goals were partially met. Patient is being discharged due to the patient's request.  ?????        5:21 PM,10/20/19 Sherol Dade PT, [place] [facility] [place]

## 2019-10-06 NOTE — Patient Instructions (Signed)
Access Code: [email or web address] URL: [email or web address] Date: 10/06/2019 Prepared by: [place]  Exercises Standing Shoulder Row with Anchored Resistance - 1 x daily - 7 x weekly - 10 reps - 3 sets Seated Shoulder External Rotation - 1 x daily - 7 x weekly - 2-3 sets - 10 reps Seated Cervical Retraction - 1 x daily - 7 x weekly - 10 reps - 5 seconds hold Hand AROM Tendon Gliding Series - 3 x daily - 7 x weekly - 3 reps - 3 seconds hold Seated Median Nerve Glide - 3 x daily - 7 x weekly - 3 reps - 3 seconds hold

## 2019-10-10 ENCOUNTER — Ambulatory Visit (INDEPENDENT_AMBULATORY_CARE_PROVIDER_SITE_OTHER): Payer: Self-pay

## 2019-10-10 DIAGNOSIS — J309 Allergic rhinitis, unspecified: Secondary | ICD-10-CM

## 2019-10-19 ENCOUNTER — Telehealth: Payer: Self-pay | Admitting: Sports Medicine

## 2019-10-19 NOTE — Telephone Encounter (Signed)
  I spoke with Samantha [name] on the phone today after receiving an email from her on July 2.  She is asking about the possibility of a cortisone injection for her carpal tunnel syndrome since she is without insurance and cannot afford surgery at this time.  I have recommended that she see Dr. Oneida Alar for a possible median nerve Hydro dissection.  She has seen him in the past for multiple other issues and is interested in getting his opinion on this.

## 2019-10-21 ENCOUNTER — Ambulatory Visit (INDEPENDENT_AMBULATORY_CARE_PROVIDER_SITE_OTHER): Payer: Self-pay

## 2019-10-21 DIAGNOSIS — J309 Allergic rhinitis, unspecified: Secondary | ICD-10-CM

## 2019-10-25 ENCOUNTER — Encounter: Payer: Self-pay | Admitting: Physical Therapy

## 2019-10-26 DIAGNOSIS — J301 Allergic rhinitis due to pollen: Secondary | ICD-10-CM

## 2019-10-26 NOTE — Progress Notes (Signed)
VIALS EXP 10-25-20

## 2019-10-27 ENCOUNTER — Other Ambulatory Visit: Payer: Self-pay

## 2019-10-27 ENCOUNTER — Ambulatory Visit (INDEPENDENT_AMBULATORY_CARE_PROVIDER_SITE_OTHER): Payer: Self-pay | Admitting: Sports Medicine

## 2019-10-27 DIAGNOSIS — G5601 Carpal tunnel syndrome, right upper limb: Secondary | ICD-10-CM

## 2019-10-27 MED ORDER — METHYLPREDNISOLONE ACETATE 40 MG/ML IJ SUSP
40.0000 mg | Freq: Once | INTRAMUSCULAR | Status: AC
  Administered 2019-10-27: 40 mg via INTRA_ARTICULAR

## 2019-10-27 NOTE — Progress Notes (Signed)
Patient with RT Carpal tunnel documented with NCV.  See visit to Dr. Micheline Chapman on 09/27/19. Sent to me for hydrodissection of median nerve  Ultrasound Screen of Rt. Median Nerve Calculated volume is 0.17 cm2 which correlates with high risk for carpal tunnel  Procedure Note Procedure:  Injection of RT Median nerve with hydro-dissection under ultrasound visualization Consent obtained and verified. Time-out conducted. Noted no overlying erythema, induration, or other signs of local infection. Skin prepped in a sterile fashion. Topical analgesic spray: Ethyl chloride. Completed without difficulty.  I placed a wheel on ulnar aspect of wrist.  Under direct observation I then advanced a needle to the median nerve.  I injected fluid along ulnar aspect and both superior and inferior aspect of nerve.  This is done without difficulty.  Image saved in [address]. Meds: 1.5 cc lidocaine 1% plus 40 mg of solumedrol - 1 cc Pain immediately improved suggesting accurate placement of the medication. Advised to call if fevers/chills, erythema, induration, drainage, or persistent bleeding.  I advised her about cautions and risks.  Hopefully with direct guidance she should get some prolonged relief.  Repeat US in 6 weeks.  [name], MD

## 2019-10-27 NOTE — Assessment & Plan Note (Signed)
Today she underwent US guided hydro-dissection of the median nerve

## 2019-11-03 ENCOUNTER — Ambulatory Visit: Payer: Self-pay | Admitting: Physical Therapy

## 2019-11-04 ENCOUNTER — Ambulatory Visit (INDEPENDENT_AMBULATORY_CARE_PROVIDER_SITE_OTHER): Payer: Self-pay

## 2019-11-04 DIAGNOSIS — J309 Allergic rhinitis, unspecified: Secondary | ICD-10-CM

## 2019-11-08 ENCOUNTER — Encounter: Payer: Self-pay | Admitting: Physical Therapy

## 2019-11-17 ENCOUNTER — Encounter: Payer: Self-pay | Admitting: Physical Therapy

## 2019-11-18 ENCOUNTER — Ambulatory Visit (INDEPENDENT_AMBULATORY_CARE_PROVIDER_SITE_OTHER): Payer: Self-pay

## 2019-11-18 DIAGNOSIS — J309 Allergic rhinitis, unspecified: Secondary | ICD-10-CM

## 2019-11-24 ENCOUNTER — Ambulatory Visit (INDEPENDENT_AMBULATORY_CARE_PROVIDER_SITE_OTHER): Payer: Self-pay

## 2019-11-24 DIAGNOSIS — J309 Allergic rhinitis, unspecified: Secondary | ICD-10-CM

## 2019-12-01 ENCOUNTER — Ambulatory Visit (INDEPENDENT_AMBULATORY_CARE_PROVIDER_SITE_OTHER): Payer: Self-pay | Admitting: *Deleted

## 2019-12-01 DIAGNOSIS — J309 Allergic rhinitis, unspecified: Secondary | ICD-10-CM

## 2019-12-08 ENCOUNTER — Other Ambulatory Visit: Payer: Self-pay

## 2019-12-08 ENCOUNTER — Ambulatory Visit: Payer: Self-pay | Admitting: Sports Medicine

## 2019-12-08 ENCOUNTER — Ambulatory Visit (INDEPENDENT_AMBULATORY_CARE_PROVIDER_SITE_OTHER): Payer: Self-pay

## 2019-12-08 DIAGNOSIS — J309 Allergic rhinitis, unspecified: Secondary | ICD-10-CM

## 2019-12-08 DIAGNOSIS — G5601 Carpal tunnel syndrome, right upper limb: Secondary | ICD-10-CM

## 2019-12-08 MED ORDER — EPINEPHRINE 0.3 MG/0.3ML IJ SOAJ: 0.3000 mg | INTRAMUSCULAR | 1 refills | Status: DC | PRN

## 2019-12-08 NOTE — Patient Instructions (Signed)
I actually think you are doing OK and the effect of the injection can take up to 3 months  Buy some Vitamin B 6 and take 50 mg. Twice a day  Use some voltaren cream and rub into wrist 2 to 3 times a day  Splint for lifting and at night  Work hand in warm water once daily  See me in 2 to 3 months and we will repeat the Ultrasound

## 2019-12-09 NOTE — Progress Notes (Signed)
CC: RT carpal tunnel  Patient underwent hydrodissection of median nerve RT hand on 10/27/19 At the time she had hand aching and numbness primarily over the RT thumb MN was 0.17cm2 She also had NCV that documented carpal tunnel  Since injection No night pain No grip weakness No numbness in fingers However, numbness persists in the distal phalanx of thumb and that is unchanged  Comes for recheck  PE NAD BP (!) 128/28   Ht 5' 7.5" (1.715 m)   Wt 180 lb (81.6 kg)   BMI 27.78 kg/m   Full ROM of RT wrist Good grip strength Sensation still feels different in distal phalanx of thumb to light touch Neg. Tinel Augmented Phalen test did not increase either numbness or pain

## 2019-12-09 NOTE — Assessment & Plan Note (Signed)
RT carpal tunnel syndrome but now only sxs are sensory change I advised that I am not sure that is going to completely return Will Add Vit. B6 Work hand in warm water Continue splint use at night and work  Give this ore time as stable and probably some improvement Reck in 2 mos and then look at MN volume on [place]

## 2019-12-29 ENCOUNTER — Ambulatory Visit (INDEPENDENT_AMBULATORY_CARE_PROVIDER_SITE_OTHER): Payer: Self-pay | Admitting: *Deleted

## 2019-12-29 DIAGNOSIS — J309 Allergic rhinitis, unspecified: Secondary | ICD-10-CM

## 2020-01-09 ENCOUNTER — Ambulatory Visit (INDEPENDENT_AMBULATORY_CARE_PROVIDER_SITE_OTHER): Payer: Self-pay | Admitting: *Deleted

## 2020-01-09 DIAGNOSIS — J309 Allergic rhinitis, unspecified: Secondary | ICD-10-CM

## 2020-01-26 ENCOUNTER — Ambulatory Visit (INDEPENDENT_AMBULATORY_CARE_PROVIDER_SITE_OTHER): Payer: Self-pay

## 2020-01-26 DIAGNOSIS — J309 Allergic rhinitis, unspecified: Secondary | ICD-10-CM

## 2020-02-09 ENCOUNTER — Ambulatory Visit (INDEPENDENT_AMBULATORY_CARE_PROVIDER_SITE_OTHER): Payer: Self-pay | Admitting: *Deleted

## 2020-02-09 DIAGNOSIS — J309 Allergic rhinitis, unspecified: Secondary | ICD-10-CM

## 2020-02-23 ENCOUNTER — Ambulatory Visit (INDEPENDENT_AMBULATORY_CARE_PROVIDER_SITE_OTHER): Payer: Self-pay

## 2020-02-23 ENCOUNTER — Ambulatory Visit (INDEPENDENT_AMBULATORY_CARE_PROVIDER_SITE_OTHER): Payer: Self-pay | Admitting: Sports Medicine

## 2020-02-23 ENCOUNTER — Other Ambulatory Visit: Payer: Self-pay

## 2020-02-23 ENCOUNTER — Ambulatory Visit: Payer: Self-pay

## 2020-02-23 VITALS — BP 137/80 | Ht 67.0 in | Wt 180.0 lb

## 2020-02-23 DIAGNOSIS — J309 Allergic rhinitis, unspecified: Secondary | ICD-10-CM

## 2020-02-23 DIAGNOSIS — M79644 Pain in right finger(s): Secondary | ICD-10-CM

## 2020-02-23 DIAGNOSIS — G5601 Carpal tunnel syndrome, right upper limb: Secondary | ICD-10-CM

## 2020-02-23 NOTE — Progress Notes (Signed)
Office Visit Note   Patient: Samantha [name]           Date of Birth: 01/19/1961           MRN: [id number] Visit Date: 02/23/2020 Requested by: Crist Infante, MD [address] [place],  [place] [address] PCP: Crist Infante, MD  Subjective: CC: Follow up, R CTS  HPI: 59yo F presenting to clinic to follow up on Right Carpal Tunnel Syndrome. Patient underwent hydrodissection on [id number], and states that this has offered significant improvement- but not complete resolution of her symptoms. States that she feels as though her grip strength has dramatically returned, however she remains with numbness in the tip of her R thumb. This did improve for about a week, but then returned a few days ago. 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  Additionally, for the past month or so, she endorses pain over the 2nd MCP joint of her right hand. States that she isn't sure how she irritated it, but it causes pain when lifting her 60mo twin grandchildren, or when lifting pans on the oven. She has tried using Voltaren gel in this area, which helps when she uses it - but she wasn't sure if this was an appropriate use for it, as she was provided the gel for her carpal tunnel.               ROS:   All other systems were reviewed and are negative.  Objective: Vital Signs: BP 137/80   Ht 5\' 7"  (1.702 m)   Wt 180 lb (81.6 kg)   BMI 28.19 kg/m   Physical Exam:  General:  Alert and oriented, in no acute distress. Pulm:  Breathing unlabored. Psy:  Normal mood, congruent affect. Skin:  Right hand with no bruising, rashes or erythema. Overlying skin intact.   Right wrist:  Bilateral hands with no obvious swelling or deformity.  Full ROM of wrist and all fingers on R hand.  5/5 strength with finger flexion/extension, and abduction. 5/5 strength with wrist flexion/extension.  Sensation intact in all distal  fingers, save for distal aspect of R thumb- which she endorses a decreased sensitivity to light touch.   Tenderness to palpation of 2nd MCP joint. No obvious bony deformity/swelling. No tenderness over flexor tendon proximal to knuckle.   Imaging:  Limited Extremity Ultrasound: Right Wrist and 2D MCP Median Nerve visualized in short axis within the carpal tunnel, with an area of 0.14cm2 (Improved from previous 0.17cm2). Nerve was followed proximally into forearm, without any evidence of impingement.   Right 2nd MCP Joint with no obvious spurring or effusion. Flexor tendon visualized intact.   Impression: Normal 2nd MCP. Improving Right Carpal Tunnel Syndrome.   Assessment & Plan: 59yo F Presenting to clinic with concerns of ongoing R Carpal tunnel syndrome and right 2D MCP Joint pain.  Carpal Tunnel Syndrome:  CTS appears to be improving, with full grip strength. Remains with decreased sensation in R thumb, though this was briefly restored for several days very recently. Optimistic that this shoulder continue to improve with conservative measures.  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 Recommend brief period of buddy taping to allow knuckle to rest. Can use Voltaren gel in this area as well as needed.  Return to Clinic in 3 months for reevaluation, or sooner if other concerns arise.  Patient expresses understanding, and agrees with plan. She has no further questions or concerns today.    Procedures: No procedures performed  No notes on file   I observed and examined the patient with the [facility] resident and agree with assessment and plan.  Note reviewed and modified by me.  [name], MD

## 2020-02-28 DIAGNOSIS — J301 Allergic rhinitis due to pollen: Secondary | ICD-10-CM

## 2020-02-28 NOTE — Progress Notes (Signed)
Vials exp 02-27-21

## 2020-03-15 ENCOUNTER — Ambulatory Visit (INDEPENDENT_AMBULATORY_CARE_PROVIDER_SITE_OTHER): Payer: Self-pay | Admitting: *Deleted

## 2020-03-15 DIAGNOSIS — J309 Allergic rhinitis, unspecified: Secondary | ICD-10-CM

## 2020-03-29 ENCOUNTER — Ambulatory Visit (INDEPENDENT_AMBULATORY_CARE_PROVIDER_SITE_OTHER): Payer: Self-pay

## 2020-03-29 DIAGNOSIS — J309 Allergic rhinitis, unspecified: Secondary | ICD-10-CM

## 2020-04-26 ENCOUNTER — Ambulatory Visit (INDEPENDENT_AMBULATORY_CARE_PROVIDER_SITE_OTHER): Payer: Self-pay | Admitting: *Deleted

## 2020-04-26 DIAGNOSIS — J309 Allergic rhinitis, unspecified: Secondary | ICD-10-CM

## 2020-05-02 ENCOUNTER — Ambulatory Visit (INDEPENDENT_AMBULATORY_CARE_PROVIDER_SITE_OTHER): Payer: Self-pay

## 2020-05-02 DIAGNOSIS — J309 Allergic rhinitis, unspecified: Secondary | ICD-10-CM

## 2020-05-11 ENCOUNTER — Ambulatory Visit (INDEPENDENT_AMBULATORY_CARE_PROVIDER_SITE_OTHER): Payer: Self-pay

## 2020-05-11 DIAGNOSIS — J309 Allergic rhinitis, unspecified: Secondary | ICD-10-CM

## 2020-05-17 ENCOUNTER — Ambulatory Visit (INDEPENDENT_AMBULATORY_CARE_PROVIDER_SITE_OTHER): Payer: Self-pay

## 2020-05-17 DIAGNOSIS — J309 Allergic rhinitis, unspecified: Secondary | ICD-10-CM

## 2020-05-24 ENCOUNTER — Ambulatory Visit (INDEPENDENT_AMBULATORY_CARE_PROVIDER_SITE_OTHER): Payer: Self-pay

## 2020-05-24 DIAGNOSIS — J309 Allergic rhinitis, unspecified: Secondary | ICD-10-CM

## 2020-06-07 ENCOUNTER — Ambulatory Visit: Payer: Self-pay | Admitting: Sports Medicine

## 2020-06-11 ENCOUNTER — Ambulatory Visit (INDEPENDENT_AMBULATORY_CARE_PROVIDER_SITE_OTHER): Payer: Self-pay

## 2020-06-11 DIAGNOSIS — J309 Allergic rhinitis, unspecified: Secondary | ICD-10-CM

## 2020-06-14 ENCOUNTER — Other Ambulatory Visit: Payer: Self-pay

## 2020-06-14 ENCOUNTER — Ambulatory Visit (INDEPENDENT_AMBULATORY_CARE_PROVIDER_SITE_OTHER): Payer: Self-pay | Admitting: Sports Medicine

## 2020-06-14 DIAGNOSIS — G5601 Carpal tunnel syndrome, right upper limb: Secondary | ICD-10-CM

## 2020-06-14 NOTE — Progress Notes (Signed)
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 This dropped to 0.14 cm 2 Today the volume is normal at 0.11 cm2 Nerve fibers appear normal  Impression: resolved carpal tunnel syndrome  Ultrasound and interpretation by Wolfgang Phoenix. Oneida Alar, MD

## 2020-06-15 NOTE — Assessment & Plan Note (Signed)
This is much improved Use splint if needed Continue hand motion in warm water  Can reck if needed

## 2020-06-21 ENCOUNTER — Ambulatory Visit (INDEPENDENT_AMBULATORY_CARE_PROVIDER_SITE_OTHER): Payer: Self-pay

## 2020-06-21 DIAGNOSIS — J309 Allergic rhinitis, unspecified: Secondary | ICD-10-CM

## 2020-07-05 ENCOUNTER — Ambulatory Visit (INDEPENDENT_AMBULATORY_CARE_PROVIDER_SITE_OTHER): Payer: Self-pay

## 2020-07-05 DIAGNOSIS — J309 Allergic rhinitis, unspecified: Secondary | ICD-10-CM

## 2020-07-26 ENCOUNTER — Ambulatory Visit (INDEPENDENT_AMBULATORY_CARE_PROVIDER_SITE_OTHER): Payer: Self-pay | Admitting: *Deleted

## 2020-07-26 DIAGNOSIS — J309 Allergic rhinitis, unspecified: Secondary | ICD-10-CM

## 2020-08-06 ENCOUNTER — Ambulatory Visit (INDEPENDENT_AMBULATORY_CARE_PROVIDER_SITE_OTHER): Payer: Self-pay | Admitting: Family Medicine

## 2020-08-06 ENCOUNTER — Other Ambulatory Visit: Payer: Self-pay

## 2020-08-06 ENCOUNTER — Encounter: Payer: Self-pay | Admitting: Family Medicine

## 2020-08-06 VITALS — BP 120/78 | Ht 67.5 in | Wt 177.0 lb

## 2020-08-06 DIAGNOSIS — M5416 Radiculopathy, lumbar region: Secondary | ICD-10-CM

## 2020-08-06 MED ORDER — DICLOFENAC SODIUM 75 MG PO TBEC: 75.0000 mg | DELAYED_RELEASE_TABLET | Freq: Two times a day (BID) | ORAL | 1 refills | Status: DC

## 2020-08-06 NOTE — Progress Notes (Signed)
PCP: Crist Infante, MD  Subjective:   HPI: Patient is a 60 y.o. female here for left hip/leg pain.  Patient reports for about 6-8 weeks she's had pain posterior left hip that goes down her leg, wraps to medial left lower leg into bottom of left foot. Wakes her up at night. No numbness or tingling. No bowel/bladder dysfunction. Tried heat but no medications. No prior similar symptoms.  Past Medical History:  Diagnosis Date  . Allergy   . Depression   . GERD (gastroesophageal reflux disease)   . Right carpal tunnel syndrome 09/19/2019  . Thyroid disease     Current Outpatient Medications on File Prior to Visit  Medication Sig Dispense Refill  . Calcium Carb-Cholecalciferol (CALCIUM-VITAMIN D3) 250-125 MG-UNIT TABS Take 1 tablet once a day    . Cholecalciferol (VITAMIN D3) 1000 units CAPS Take 1,000 Units by mouth daily.    . diclofenac Sodium (VOLTAREN) 1 % GEL Apply 2 g topically 4 (four) times daily. 100 g 3  . EPINEPHrine 0.3 mg/0.3 mL IJ SOAJ injection Inject 0.3 mLs (0.3 mg total) into the muscle as needed for anaphylaxis. 2 each 1  . hydrocortisone 2.5 % ointment   1  . sertraline (ZOLOFT) 50 MG tablet Take 50 mg by mouth daily.    Marland Kitchen TIROSINT 88 MCG CAPS   10   No current facility-administered medications on file prior to visit.    Past Surgical History:  Procedure Laterality Date  . TONSILLECTOMY      Allergies  Allergen Reactions  . Sulfonamide Derivatives Nausea And Vomiting  . Nortriptyline Other (See Comments)    depression  . Omeprazole Hives  . Singulair [Montelukast Sodium] Other (See Comments)    Depressed mood    Social History   Socioeconomic History  . Marital status: Married    Spouse name: Not on file  . Number of children: Not on file  . Years of education: Not on file  . Highest education level: Not on file  Occupational History  . Not on file  Tobacco Use  . Smoking status: Never Smoker  . Smokeless tobacco: Never Used  Vaping Use  .  Vaping Use: Never used  Substance and Sexual Activity  . Alcohol use: No  . Drug use: No  . Sexual activity: Not on file  Other Topics Concern  . Not on file  Social History Narrative  . Not on file   Social Determinants of Health   Financial Resource Strain: Not on file  Food Insecurity: Not on file  Transportation Needs: Not on file  Physical Activity: Not on file  Stress: Not on file  Social Connections: Not on file  Intimate Partner Violence: Not on file    Family History  Problem Relation Age of Onset  . Hyperlipidemia Father     BP 120/78   Ht 5' 7.5" (1.715 m)   Wt 177 lb (80.3 kg)   BMI 27.31 kg/m   [facility] 02/23/2020 08/06/2020  Frequency of aerobic exercise (# of days/week) 0 2  Average time in minutes 0 45  Frequency of strengthening activities (# of days/week) 0 0    No flowsheet data found.  Review of Systems: See HPI above.     Objective:  Physical Exam:  Gen: NAD, comfortable in exam room  Back: No gross deformity, scoliosis. No TTP.  No midline or bony TTP. FROM without pain. Strength LEs 5/5 all muscle groups except 4/5 left hip flexion.  2+ MSRs in patellar and achilles tendons, equal bilaterally. Negative SLRs (tight on left). Sensation intact to light touch bilaterally.  Left hip: No deformity. FROM with 5/5 strength except 4/5 left hip flexion. No tenderness to palpation. NVI distally. Negative logroll Negative faber, fadir, and piriformis stretches.   Assessment & Plan:  1. Left leg pain - consistent with lumbar radiculopathy likely from L4 nerve root.  She noted prior history of upper thoracic and cervical disc degeneration.  Start diclofenac twice a day and formal physical therapy.  Consider MRI if not improving as expected.  F/u in 1 month.

## 2020-08-06 NOTE — Patient Instructions (Signed)
You have lumbar radiculopathy (a pinched nerve in your low back) - likely L4 based on the distribution. Ok to take tylenol for baseline pain relief (1-2 extra strength tabs 3x/day) Start diclofenac twice a day with food for pain and inflammation. Stay as active as possible. Physical therapy has been shown to be helpful as well - start this and do home exercises on days you don't go to therapy. If not improving, will consider further imaging (MRI). Follow up with me in 1 month but call sooner if you're struggling.

## 2020-08-07 DIAGNOSIS — J301 Allergic rhinitis due to pollen: Secondary | ICD-10-CM

## 2020-08-07 NOTE — Progress Notes (Signed)
VIALS EXP 08-07-21 

## 2020-08-15 ENCOUNTER — Encounter: Payer: Self-pay | Admitting: Physical Therapy

## 2020-08-15 ENCOUNTER — Ambulatory Visit: Payer: Managed Care, Other (non HMO) | Attending: Family Medicine | Admitting: Physical Therapy

## 2020-08-15 ENCOUNTER — Other Ambulatory Visit: Payer: Self-pay

## 2020-08-15 DIAGNOSIS — M5442 Lumbago with sciatica, left side: Secondary | ICD-10-CM | POA: Diagnosis present

## 2020-08-15 DIAGNOSIS — M62830 Muscle spasm of back: Secondary | ICD-10-CM | POA: Insufficient documentation

## 2020-08-15 DIAGNOSIS — M6281 Muscle weakness (generalized): Secondary | ICD-10-CM | POA: Diagnosis present

## 2020-08-15 NOTE — Patient Instructions (Signed)
Access Code: [email or web address] URL: [email or web address] Date: 08/15/2020 Prepared by: Everardo All  Exercises Supine Lower Trunk Rotation - 2 x daily - 7 x weekly - 1 sets - 3 reps - 10s hold Supine Posterior Pelvic Tilt - 2 x daily - 7 x weekly - 1 sets - 10 reps - 5s hold Supine Sciatic Nerve Glide - 2 x daily - 7 x weekly - 1 sets - 20 reps Supine Piriformis Stretch with Leg Straight - 2 x daily - 7 x weekly - 1 sets - 2 reps - 15s hold

## 2020-08-15 NOTE — Therapy (Signed)
[facility] [address] [place], [place], [address] Phone: [phone]   Fax:  [phone]  Physical Therapy Evaluation  Patient Details  Name: Samantha [name] MRN: [id number] Date of Birth: June 21, 1960 Referring Provider (PT): Karlton Lemon, MD   Encounter Date: 08/15/2020   PT End of Session - 08/15/20 1452    Visit Number 1    Date for PT Re-Evaluation 09/28/20    Authorization Type [employer]    PT Start Time 1401    PT Stop Time 9794    PT Time Calculation (min) 41 min    Activity Tolerance Patient tolerated treatment well;No increased pain    Behavior During Therapy WFL for tasks assessed/performed           Past Medical History:  Diagnosis Date  . Allergy   . Depression   . GERD (gastroesophageal reflux disease)   . Right carpal tunnel syndrome 09/19/2019  . Thyroid disease     Past Surgical History:  Procedure Laterality Date  . TONSILLECTOMY      There were no vitals filed for this visit.    Subjective Assessment - 08/15/20 1402    Subjective Patient presenting due to Lt hip pain that radiates into distal Lt LE. States that pain began 6-8 weeks ago which has progressively worsened. States that pain begins in Lt hip and radiates to knee and lateral aspect of foot. Hip pain is worse at night and first hour upon waking up in the morning.    Pertinent History thoracic disc disease    Limitations Walking;House hold activities    How long can you sit comfortably? unlimited    How long can you stand comfortably? 5-10 minutes    How long can you walk comfortably? 30-45 minutes    Patient Stated Goals decreased pain; walk dog without pain    Currently in Pain? Yes    Pain Score 2     Pain Location Hip    Pain Orientation Left    Pain Descriptors / Indicators Aching;Burning    Pain Type Acute pain    Pain Radiating Towards Lt knee, Lt heel, lateral aspect of Lt foot    Pain Onset More than a month ago     Pain Frequency Intermittent    Aggravating Factors  prolonged walking and standing; floor to stand transition              [facility] PT Assessment - 08/15/20 0001      Assessment   Medical Diagnosis M54.16 (ICD-10-CM) - Lumbar radiculopathy    Referring Provider (PT) Karlton Lemon, MD    Onset Date/Surgical Date --   6-8 weeks ago   Hand Dominance Right    Next MD Visit None    Prior Therapy Yes      Precautions   Precautions None      Restrictions   Weight Bearing Restrictions No      Balance Screen   Has the patient fallen in the past 6 months No    Has the patient had a decrease in activity level because of a fear of falling?  No    Is the patient reluctant to leave their home because of a fear of falling?  No      Home Environment   Living Environment Private residence    Living Arrangements Spouse/significant other      Prior Function   Level of Independence Independent    Vocation Part time employment  [employer]    Leisure garden      Cognition   Overall Cognitive Status Within Functional Limits for tasks assessed      Observation/Other Assessments   Focus on Therapeutic Outcomes (FOTO)  47      Functional Tests   Functional tests Single leg stance      Single Leg Stance   Comments able to maintain 15 bil LE      Posture/Postural Control   Posture/Postural Control No significant limitations      ROM / Strength   AROM / PROM / Strength AROM;Strength      AROM   Overall AROM  Within functional limits for tasks performed    Overall AROM Comments lumbar ROM full in all directions; no increased pain in any direction      Strength   Strength Assessment Site Hip;Knee;Ankle    Right/Left Hip Right;Left    Right Hip Extension 4+/5    Right Hip ABduction 4+/5    Right Hip ADduction 4/5    Left Hip Extension 4-/5    Left Hip ABduction 4/5    Right/Left Knee Right;Left    Right Knee Flexion 5/5    Right Knee Extension 5/5     Left Knee Flexion 5/5    Left Knee Extension 4-/5    Right Ankle Dorsiflexion 5/5    Right Ankle Plantar Flexion 5/5    Left Ankle Dorsiflexion 4-/5    Left Ankle Plantar Flexion 5/5      Palpation   Spinal mobility hypomobility of spinal segments L1-5    Palpation comment L1-5 tender to palpation with PAs; Lt piriformis and glute min tender to palpation      Special Tests    Special Tests Lumbar    Lumbar Tests Slump Test      Slump test   Findings Positive    Side Left      Transfers   Five time sit to stand comments  13s                      Objective measurements completed on examination: See above findings.               PT Education - 08/15/20 1439    Education Details Access Code: [email or web address]: lower trunk rotation; supine piriformis stretch; supine posterior pelvic tilt; supine sciatic nerve glide    Person(s) Educated Patient    Methods Explanation;Demonstration;Tactile cues;Verbal cues;Handout    Comprehension Verbalized understanding;Returned demonstration;Verbal cues required;Tactile cues required            PT Short Term Goals - 08/15/20 1449      PT SHORT TERM GOAL #1   Title Patient will be independent with HEP for continued progression at home.    Time 3    Period Weeks    Status New    Target Date 09/05/20      PT SHORT TERM GOAL #2   Title Patient will report standing tolerance increased to 30 minutes to more readily perform ADLs.    Baseline 5-10 minutes    Time 3    Period Weeks    Status New    Target Date 09/05/20             PT Long Term Goals - 08/15/20 1450      PT LONG TERM GOAL #1   Title Patient will be independent with advanced HEP for long term management of symptoms post D/C.  Time 6    Period Weeks    Status New    Target Date 09/26/20      PT LONG TERM GOAL #2   Title Patient will improve FOTO score to 67 or greater to indicate improved overall function.    Baseline 47    Time 6    Period  Weeks    Status New    Target Date 09/26/20      PT LONG TERM GOAL #3   Title Patient will transition from standing to floor level and back to standing using good mechanics and without increased pain to more readily perform work duties.    Time 6    Period Weeks    Status New    Target Date 09/26/20                  Plan - 08/15/20 1443    Clinical Impression Statement Patient is a 60 y/o female referred due to lumbar radiculopathy with radiation into Lt LE. PMH includes thoracic disc disease. Patient reported activity limitations include prolonged ambulation, prolonged standing, and transitioning from floor level to standing for homemaking and work duties. Patient demonstrates Lt LE strength impairments as glute med strength is 4-/5. She exhibits no gross lumbar AROM impairments. Patient completing sit to stand in 13s and maintaining SLS for 15s on bil LE indicating decreased fall risk. Lt Slump test possible indicating possible radicular involvement. Upon palpation therapist noting decreased segmental mobility of lumbar spine. Would benefit from skilled therapeutic intervention to address impairments to more readily perform home ADLs and work duties.    Personal Factors and Comorbidities Comorbidity 1    Comorbidities thoracic disc disease    Examination-Activity Limitations Caring for Others;Stand    Examination-Participation Restrictions Cleaning;Laundry;Occupation;Yard Work;Shop    Stability/Clinical Decision Making Stable/Uncomplicated    Clinical Decision Making Low    Rehab Potential Good    PT Frequency 2x / week    PT Duration 6 weeks    PT Treatment/Interventions ADLs/Self Care Home Management;Aquatic Therapy;Cryotherapy;Electrical Stimulation;Moist Heat;Traction;Gait training;Functional mobility training;Therapeutic activities;Therapeutic exercise;Neuromuscular re-education;Patient/family education;Manual techniques;Dry needling;Passive range of motion;Spinal  Manipulations;Joint Manipulations    PT Next Visit Plan review HEP; begin gentle core and glute strengthening; lumbar mobility    PT Home Exercise Plan Access Code [email or web address]    Consulted and Agree with Plan of Care Patient           Patient will benefit from skilled therapeutic intervention in order to improve the following deficits and impairments:  Decreased activity tolerance,Decreased endurance,Decreased strength,Difficulty walking,Increased fascial restricitons,Increased muscle spasms,Improper body mechanics,Pain  Visit Diagnosis: Muscle spasm of back - Plan: PT plan of care cert/re-cert  Muscle weakness (generalized) - Plan: PT plan of care cert/re-cert  Acute left-sided low back pain with left-sided sciatica - Plan: PT plan of care cert/re-cert     Problem List Patient Active Problem List   Diagnosis Date Noted  . Carpal tunnel syndrome 09/19/2019  . Thoracic disc disorder 02/04/2018  . Inflammatory heel pain 02/22/2016  . Osteoarthritis, hand 02/15/2015  . Elbow pain, chronic, right 01/03/2015  . Cervical radiculopathy at C8 03/21/2014  . Degenerative disc disease, cervical 08/18/2013  . Degenerative arthritis of lumbar spine 08/18/2013  . Achilles tendinitis 01/06/2012  . HYPOTHYROIDISM 03/15/2009  . Plantar fasciitis, right 03/15/2009  . CAVUS DEFORMITY OF FOOT, ACQUIRED 03/15/2009   Everardo All PT, DPT  08/15/20 4:05 PM   [facility] [address] [place],  [place], [address] Phone: [phone]   Fax:  [phone]  Name: Samantha [name] MRN: [id number] Date of Birth: 10-24-1960

## 2020-08-16 ENCOUNTER — Ambulatory Visit: Payer: Self-pay | Admitting: Sports Medicine

## 2020-08-17 ENCOUNTER — Ambulatory Visit (INDEPENDENT_AMBULATORY_CARE_PROVIDER_SITE_OTHER): Payer: Self-pay

## 2020-08-17 DIAGNOSIS — J309 Allergic rhinitis, unspecified: Secondary | ICD-10-CM

## 2020-08-22 ENCOUNTER — Ambulatory Visit: Payer: Managed Care, Other (non HMO) | Admitting: Physical Therapy

## 2020-08-22 ENCOUNTER — Other Ambulatory Visit: Payer: Self-pay

## 2020-08-22 DIAGNOSIS — M6281 Muscle weakness (generalized): Secondary | ICD-10-CM

## 2020-08-22 DIAGNOSIS — M62830 Muscle spasm of back: Secondary | ICD-10-CM | POA: Diagnosis not present

## 2020-08-22 DIAGNOSIS — M5442 Lumbago with sciatica, left side: Secondary | ICD-10-CM

## 2020-08-22 NOTE — Patient Instructions (Signed)
Supine Bridge with Spinal Articulation - 2 x daily - 7 x weekly - 2 sets - 10 reps - 2s hold

## 2020-08-22 NOTE — Therapy (Signed)
[facility] [address], [place], [place], [address] Phone: [phone]   Fax:  [phone]  Physical Therapy Treatment  Patient Details  Name: Samantha [name] MRN: [id number] Date of Birth: [birth year] Referring Provider (PT): Karlton Lemon, MD   Encounter Date: 08/22/2020   PT End of Session - 08/22/20 1611    Visit Number 2    Date for PT Re-Evaluation 09/28/20    Authorization Type [employer]    PT Start Time 1530    PT Stop Time 1408    PT Time Calculation (min) 1358 min    Activity Tolerance Patient tolerated treatment well;No increased pain    Behavior During Therapy WFL for tasks assessed/performed           Past Medical History:  Diagnosis Date  . Allergy   . Depression   . GERD (gastroesophageal reflux disease)   . Right carpal tunnel syndrome 09/19/2019  . Thyroid disease     Past Surgical History:  Procedure Laterality Date  . TONSILLECTOMY      There were no vitals filed for this visit.   Subjective Assessment - 08/22/20 1535    Subjective Had increased pain following initial evaluation. Pain has since decreased.    Pertinent History thoracic disc disease    Limitations Walking;House hold activities    How long can you sit comfortably? unlimited    How long can you stand comfortably? 5-10 minutes    How long can you walk comfortably? 30-45 minutes    Patient Stated Goals decreased pain; walk dog without pain    Currently in Pain? Yes    Pain Score 2     Pain Location Hip    Pain Orientation Left    Pain Descriptors / Indicators Radiating;Aching    Pain Type Acute pain    Pain Onset More than a month ago    Pain Frequency Intermittent                             [facility] Adult PT Treatment/Exercise - 08/22/20 0001      Lumbar Exercises: Stretches   Active Hamstring Stretch 3 reps;10 seconds    Active Hamstring Stretch Limitations seated    Single Knee to Chest Stretch  Left;2 reps;10 seconds    Single Knee to Chest Stretch Limitations with towel to assist    Lower Trunk Rotation 5 reps    Lower Trunk Rotation Limitations 5s hold    Piriformis Stretch Left;2 reps;20 seconds      Lumbar Exercises: Standing   Row Power tower;Both;10 reps    Row Limitations 20 lbs    Shoulder Extension Power Tower;Both;10 reps    Shoulder Extension Limitations 20 lbs    Other Standing Lumbar Exercises anti rotation press with stir the pot; red tband; x15 Rt/Lt      Lumbar Exercises: Seated   Other Seated Lumbar Exercises foam roll push for ab set; x15 repetitions    Other Seated Lumbar Exercises ball squeeze with ab set; 10 repetitions x 5s hold      Lumbar Exercises: Supine   Pelvic Tilt 10 reps;5 seconds    Bridge Limitations articulated bridge; 2 x 10 repetitions                  PT Education - 08/22/20 1608    Education Details articulated brdige    Person(s) Educated Patient    Methods Explanation;Demonstration;Tactile cues;Verbal cues;Handout  Comprehension Verbalized understanding;Returned demonstration;Verbal cues required;Tactile cues required            PT Short Term Goals - 08/15/20 1449      PT SHORT TERM GOAL #1   Title Patient will be independent with HEP for continued progression at home.    Time 3    Period Weeks    Status New    Target Date 09/05/20      PT SHORT TERM GOAL #2   Title Patient will report standing tolerance increased to 30 minutes to more readily perform ADLs.    Baseline 5-10 minutes    Time 3    Period Weeks    Status New    Target Date 09/05/20             PT Long Term Goals - 08/15/20 1450      PT LONG TERM GOAL #1   Title Patient will be independent with advanced HEP for long term management of symptoms post D/C.    Time 6    Period Weeks    Status New    Target Date 09/26/20      PT LONG TERM GOAL #2   Title Patient will improve FOTO score to 67 or greater to indicate improved overall  function.    Baseline 47    Time 6    Period Weeks    Status New    Target Date 09/26/20      PT LONG TERM GOAL #3   Title Patient will transition from standing to floor level and back to standing using good mechanics and without increased pain to more readily perform work duties.    Time 6    Period Weeks    Status New    Target Date 09/26/20                 Plan - 08/22/20 1609    Clinical Impression Statement Patient reporting partial centralization of symptoms at end of session as pain mostly felt in back only. Requiring verbal and tactile cues for decreased valsalva manuever during pelvic tilt exercise. Would benefit from continued skilled intervention for decreased pain and improved activity tolerance.    Personal Factors and Comorbidities Comorbidity 1    Comorbidities thoracic disc disease    Examination-Activity Limitations Caring for Others;Stand    Examination-Participation Restrictions Cleaning;Laundry;Occupation;Yard Work;Shop    Rehab Potential Good    PT Frequency 2x / week    PT Duration 6 weeks    PT Treatment/Interventions ADLs/Self Care Home Management;Aquatic Therapy;Cryotherapy;Electrical Stimulation;Moist Heat;Traction;Gait training;Functional mobility training;Therapeutic activities;Therapeutic exercise;Neuromuscular re-education;Patient/family education;Manual techniques;Dry needling;Passive range of motion;Spinal Manipulations;Joint Manipulations    PT Next Visit Plan continue glute and core strengthening; lumbar mobility    PT Home Exercise Plan Access Code [email or web address]    Consulted and Agree with Plan of Care Patient           Patient will benefit from skilled therapeutic intervention in order to improve the following deficits and impairments:  Decreased activity tolerance,Decreased endurance,Decreased strength,Difficulty walking,Increased fascial restricitons,Increased muscle spasms,Improper body mechanics,Pain  Visit Diagnosis: Muscle spasm of  back  Muscle weakness (generalized)  Acute left-sided low back pain with left-sided sciatica     Problem List Patient Active Problem List   Diagnosis Date Noted  . Carpal tunnel syndrome 09/19/2019  . Thoracic disc disorder 02/04/2018  . Inflammatory heel pain 02/22/2016  . Osteoarthritis, hand 02/15/2015  . Elbow pain, chronic, right 01/03/2015  . Cervical radiculopathy at C8 03/21/2014  .  Degenerative disc disease, cervical 08/18/2013  . Degenerative arthritis of lumbar spine 08/18/2013  . Achilles tendinitis 01/06/2012  . HYPOTHYROIDISM 03/15/2009  . Plantar fasciitis, right 03/15/2009  . CAVUS DEFORMITY OF FOOT, ACQUIRED 03/15/2009    Everardo All PT, DPT  08/22/20 4:12 PM   [facility] [address], [place], [place], [address] Phone: [phone]   Fax:  [phone]  Name: Samantha [name] MRN: [id number] Date of Birth: 10-Nov-1960

## 2020-09-03 ENCOUNTER — Telehealth: Payer: Self-pay | Admitting: Allergy

## 2020-09-03 NOTE — Telephone Encounter (Signed)
Left voicemail to schedule yearly follow up for injection purposes.

## 2020-09-05 ENCOUNTER — Ambulatory Visit: Payer: Managed Care, Other (non HMO) | Admitting: Family Medicine

## 2020-09-06 ENCOUNTER — Ambulatory Visit: Payer: Managed Care, Other (non HMO) | Admitting: Physical Therapy

## 2020-09-06 ENCOUNTER — Ambulatory Visit (INDEPENDENT_AMBULATORY_CARE_PROVIDER_SITE_OTHER): Payer: Managed Care, Other (non HMO) | Admitting: *Deleted

## 2020-09-06 ENCOUNTER — Other Ambulatory Visit: Payer: Self-pay

## 2020-09-06 DIAGNOSIS — M6281 Muscle weakness (generalized): Secondary | ICD-10-CM

## 2020-09-06 DIAGNOSIS — J309 Allergic rhinitis, unspecified: Secondary | ICD-10-CM | POA: Diagnosis not present

## 2020-09-06 DIAGNOSIS — M62830 Muscle spasm of back: Secondary | ICD-10-CM

## 2020-09-06 DIAGNOSIS — M5442 Lumbago with sciatica, left side: Secondary | ICD-10-CM

## 2020-09-06 NOTE — Patient Instructions (Addendum)
Trigger Point Dry Needling  . What is Trigger Point Dry Needling (DN)? o DN is a physical therapy technique used to treat muscle pain and dysfunction. Specifically, DN helps deactivate muscle trigger points (muscle knots).  o A thin filiform needle is used to penetrate the skin and stimulate the underlying trigger point. The goal is for a local twitch response (LTR) to occur and for the trigger point to relax. No medication of any kind is injected during the procedure.   . What Does Trigger Point Dry Needling Feel Like?  o The procedure feels different for each individual patient. Some patients report that they do not actually feel the needle enter the skin and overall the process is not painful. Very mild bleeding may occur. However, many patients feel a deep cramping in the muscle in which the needle was inserted. This is the local twitch response.   Marland Kitchen How Will I feel after the treatment? o Soreness is normal, and the onset of soreness may not occur for a few hours. Typically this soreness does not last longer than two days.  o Bruising is uncommon, however; ice can be used to decrease any possible bruising.  o In rare cases feeling tired or nauseous after the treatment is normal. In addition, your symptoms may get worse before they get better, this period will typically not last longer than 24 hours.   . What Can I do After My Treatment? o Increase your hydration by drinking more water for the next 24 hours. o You may place ice or heat on the areas treated that have become sore, however, do not use heat on inflamed or bruised areas. Heat often brings more relief post needling. o You can continue your regular activities, but vigorous activity is not recommended initially after the treatment for 24 hours. o DN is best combined with other physical therapy such as strengthening, stretching, and other therapies.    Supine Bridge with Mini Swiss Ball Between Knees - 2 x daily - 7 x weekly - 2 sets  - 12 reps Supine Multifidus with Heel Press - Leg Bent - 2 x daily - 7 x weekly - 1 sets - 10 reps - 3s hold Hooklying Sequential Leg March and Lower - 2 x daily - 7 x weekly - 1 sets - 10 reps

## 2020-09-06 NOTE — Therapy (Signed)
[facility] [address], [place], [place], [address] Phone: [phone]   Fax:  [phone]  Physical Therapy Treatment  Patient Details  Name: Samantha [name] MRN: [id number] Date of Birth: [birth year] Referring Provider (PT): Karlton Lemon, MD   Encounter Date: 09/06/2020   PT End of Session - 09/06/20 1444    Visit Number 3    Date for PT Re-Evaluation 09/28/20    Authorization Type [employer]    PT Start Time 1400    PT Stop Time 7510    PT Time Calculation (min) 39 min    Activity Tolerance Patient tolerated treatment well;No increased pain    Behavior During Therapy WFL for tasks assessed/performed           Past Medical History:  Diagnosis Date  . Allergy   . Depression   . GERD (gastroesophageal reflux disease)   . Right carpal tunnel syndrome 09/19/2019  . Thyroid disease     Past Surgical History:  Procedure Laterality Date  . TONSILLECTOMY      There were no vitals filed for this visit.   Subjective Assessment - 09/06/20 1403    Subjective Has decreased pain. Has been resting a lot since being ill.    Pertinent History thoracic disc disease    Limitations Walking;House hold activities    How long can you sit comfortably? unlimited    How long can you stand comfortably? 5-10 minutes    How long can you walk comfortably? 30-45 minutes    Patient Stated Goals decreased pain; walk dog without pain    Currently in Pain? Yes    Pain Score 3     Pain Location Hip    Pain Orientation Left    Pain Descriptors / Indicators Radiating    Pain Type Acute pain    Pain Radiating Towards Lt calf/achilles    Pain Onset More than a month ago                             [facility] Adult PT Treatment/Exercise - 09/06/20 0001      Lumbar Exercises: Stretches   Piriformis Stretch Left;2 reps;20 seconds      Lumbar Exercises: Standing   Shoulder Extension Power Tower;Both    Shoulder Extension  Limitations 2 x 10 repetitions; 15 lbs    Other Standing Lumbar Exercises lateral step up; 6 inch step; Lt only; 2 x 10 repetitions      Lumbar Exercises: Seated   Sit to Stand 10 reps    Sit to Stand Limitations 10 lb plate    Other Seated Lumbar Exercises on blue ball; chops w/ yellow ball; x10 Lt/Rt      Lumbar Exercises: Supine   Ab Set 10 reps;3 seconds    AB Set Limitations with ball squeeze; 10 reps; 3 seconds    Bridge with [employer] 10 reps    Bridge with [employer] Limitations articulated bridge    Other Supine Lumbar Exercises isometric heel press for multifidus activation; 10 reps; 3 second hold    Other Supine Lumbar Exercises ab set with march; x10 repetitions      Lumbar Exercises: Sidelying   Clam Left    Clam Limitations 2 x 12 repetitions; blue loop      Lumbar Exercises: Quadruped   Madcat/Old Horse 10 reps    Other Quadruped Lumbar Exercises childs pose; 2 repetitions; 10 second hold  PT Education - 09/06/20 1443    Education Details bridge with ball squeeze; ab set with march; supine heel press    Person(s) Educated Patient    Methods Explanation;Tactile cues;Demonstration;Verbal cues;Handout    Comprehension Verbalized understanding;Returned demonstration;Verbal cues required;Tactile cues required            PT Short Term Goals - 09/06/20 1444      PT SHORT TERM GOAL #1   Title Patient will be independent with HEP for continued progression at home.    Time 3    Period Weeks    Status Achieved    Target Date 09/05/20             PT Long Term Goals - 08/15/20 1450      PT LONG TERM GOAL #1   Title Patient will be independent with advanced HEP for long term management of symptoms post D/C.    Time 6    Period Weeks    Status New    Target Date 09/26/20      PT LONG TERM GOAL #2   Title Patient will improve FOTO score to 67 or greater to indicate improved overall function.    Baseline 47    Time 6    Period  Weeks    Status New    Target Date 09/26/20      PT LONG TERM GOAL #3   Title Patient will transition from standing to floor level and back to standing using good mechanics and without increased pain to more readily perform work duties.    Time 6    Period Weeks    Status New    Target Date 09/26/20                 Plan - 09/06/20 1443    Clinical Impression Statement Patient reporting continued symptom radiation into Lt achilles. Verbal cues provided for controlled descent when performing lateral step up exercise indicating continued Lt glute strength impairments. Demonstrating improved abdominal activation during ab set exercise. Would benefit from continued skilled intervention for improved activity tolerance and decreased pain.    Personal Factors and Comorbidities Comorbidity 1    Comorbidities thoracic disc disease    Examination-Activity Limitations Caring for Others;Stand    Examination-Participation Restrictions Cleaning;Laundry;Occupation;Yard Work;Shop    Rehab Potential Good    PT Frequency 2x / week    PT Duration 6 weeks    PT Treatment/Interventions ADLs/Self Care Home Management;Aquatic Therapy;Cryotherapy;Electrical Stimulation;Moist Heat;Traction;Gait training;Functional mobility training;Therapeutic activities;Therapeutic exercise;Neuromuscular re-education;Patient/family education;Manual techniques;Dry needling;Passive range of motion;Spinal Manipulations;Joint Manipulations    PT Next Visit Plan progress glute and core strengthening; dry needling next session    PT Home Exercise Plan Access Code [email or web address]    Consulted and Agree with Plan of Care Patient           Patient will benefit from skilled therapeutic intervention in order to improve the following deficits and impairments:  Decreased activity tolerance,Decreased endurance,Decreased strength,Difficulty walking,Increased fascial restricitons,Increased muscle spasms,Improper body mechanics,Pain  Visit  Diagnosis: Muscle spasm of back  Muscle weakness (generalized)  Acute left-sided low back pain with left-sided sciatica     Problem List Patient Active Problem List   Diagnosis Date Noted  . Carpal tunnel syndrome 09/19/2019  . Thoracic disc disorder 02/04/2018  . Inflammatory heel pain 02/22/2016  . Osteoarthritis, hand 02/15/2015  . Elbow pain, chronic, right 01/03/2015  . Cervical radiculopathy at C8 03/21/2014  . Degenerative disc disease, cervical 08/18/2013  . Degenerative arthritis  of lumbar spine 08/18/2013  . Achilles tendinitis 01/06/2012  . HYPOTHYROIDISM 03/15/2009  . Plantar fasciitis, right 03/15/2009  . CAVUS DEFORMITY OF FOOT, ACQUIRED 03/15/2009    Everardo All PT, DPT  09/06/20 2:45 PM    [facility] [address], [place], [place], [address] Phone: [phone]   Fax:  [phone]  Name: Samantha [name] MRN: [id number] Date of Birth: 1960/11/21

## 2020-09-12 ENCOUNTER — Other Ambulatory Visit: Payer: Self-pay

## 2020-09-12 ENCOUNTER — Ambulatory Visit: Payer: Managed Care, Other (non HMO) | Attending: Family Medicine | Admitting: Physical Therapy

## 2020-09-12 DIAGNOSIS — M62830 Muscle spasm of back: Secondary | ICD-10-CM | POA: Diagnosis not present

## 2020-09-12 DIAGNOSIS — M6281 Muscle weakness (generalized): Secondary | ICD-10-CM

## 2020-09-12 DIAGNOSIS — M5442 Lumbago with sciatica, left side: Secondary | ICD-10-CM | POA: Insufficient documentation

## 2020-09-12 NOTE — Therapy (Signed)
[facility] [address], [place], [place], [address] Phone: [phone]   Fax:  [phone]  Physical Therapy Treatment  Patient Details  Name: Samantha [name] MRN: [id number] Date of Birth: 1961/03/18 Referring Provider (PT): Karlton Lemon, MD   Encounter Date: 09/12/2020   PT End of Session - 09/12/20 1656    Visit Number 4    Date for PT Re-Evaluation 09/28/20    Authorization Type [employer]    PT Start Time 1534    PT Stop Time 1612    PT Time Calculation (min) 38 min    Activity Tolerance Patient tolerated treatment well;No increased pain    Behavior During Therapy WFL for tasks assessed/performed           Past Medical History:  Diagnosis Date  . Allergy   . Depression   . GERD (gastroesophageal reflux disease)   . Right carpal tunnel syndrome 09/19/2019  . Thyroid disease     Past Surgical History:  Procedure Laterality Date  . TONSILLECTOMY      There were no vitals filed for this visit.   Subjective Assessment - 09/12/20 1537    Subjective Has point tenderness at Lt heel and Lt posterior hip pain.    Pertinent History thoracic disc disease    Limitations Walking;House hold activities    How long can you sit comfortably? unlimited    How long can you stand comfortably? 5-10 minutes    How long can you walk comfortably? 30-45 minutes    Patient Stated Goals decreased pain; walk dog without pain    Currently in Pain? Yes    Pain Score 2     Pain Location Hip    Pain Orientation Right    Pain Descriptors / Indicators Aching;Sore    Pain Onset More than a month ago    Pain Frequency Intermittent                             [facility] Adult PT Treatment/Exercise - 09/12/20 0001      Manual Therapy   Manual Therapy Joint mobilization;Soft tissue mobilization    Joint Mobilization PAs Grade 1-4 L1-5    Soft tissue mobilization STM bilateral lumbar paraspinals, QL, erector spinae, Lt  glute med, Lt piriformis released; skilled palpation and assessment of tissues before, during, and after dry needling            Trigger Point Dry Needling - 09/12/20 0001    Consent Given? Yes    Education Handout Provided Previously provided    Muscles Treated Back/Hip Gluteus minimus;Piriformis;Erector spinae;Lumbar multifidi    Other Dry Needling Lt only    Gluteus Minimus Response Palpable increased muscle length    Piriformis Response Palpable increased muscle length    Erector spinae Response Palpable increased muscle length    Lumbar multifidi Response Palpable increased muscle length                  PT Short Term Goals - 09/12/20 1655      PT SHORT TERM GOAL #2   Title Patient will report standing tolerance increased to 30 minutes to more readily perform ADLs.    Baseline 5-10 minutes    Time 3    Period Weeks    Status Partially Met    Target Date 09/05/20             PT Long Term Goals - 08/15/20  [place] #1   Title Patient will be independent with advanced HEP for long term management of symptoms post D/C.    Time 6    Period Weeks    Status New    Target Date 09/26/20      PT LONG TERM GOAL #2   Title Patient will improve FOTO score to 67 or greater to indicate improved overall function.    Baseline 47    Time 6    Period Weeks    Status New    Target Date 09/26/20      PT LONG TERM GOAL #3   Title Patient will transition from standing to floor level and back to standing using good mechanics and without increased pain to more readily perform work duties.    Time 6    Period Weeks    Status New    Target Date 09/26/20                 Plan - 09/12/20 1651    Clinical Impression Statement Patient reporting significant response to dry needling when performed to lumbar multifidus. Reporting partial symptom relief at end of session following manual and dry needling.  Would benefit from continued skilled intervention  for improved activity tolerance and functional mobility.    Personal Factors and Comorbidities Comorbidity 1    Comorbidities thoracic disc disease    Examination-Activity Limitations Caring for Others;Stand    Examination-Participation Restrictions Cleaning;Laundry;Occupation;Yard Work;Shop    Rehab Potential Good    PT Frequency 2x / week    PT Duration 6 weeks    PT Treatment/Interventions ADLs/Self Care Home Management;Aquatic Therapy;Cryotherapy;Electrical Stimulation;Moist Heat;Traction;Gait training;Functional mobility training;Therapeutic activities;Therapeutic exercise;Neuromuscular re-education;Patient/family education;Manual techniques;Dry needling;Passive range of motion;Spinal Manipulations;Joint Manipulations    PT Next Visit Plan continue glute and core strengthening adding functional component as appropriate; dry needling as needed    PT Home Exercise Plan Access Code [email or web address]    Consulted and Agree with Plan of Care Patient           Patient will benefit from skilled therapeutic intervention in order to improve the following deficits and impairments:  Decreased activity tolerance,Decreased endurance,Decreased strength,Difficulty walking,Increased fascial restricitons,Increased muscle spasms,Improper body mechanics,Pain  Visit Diagnosis: Muscle spasm of back  Muscle weakness (generalized)  Acute left-sided low back pain with left-sided sciatica     Problem List Patient Active Problem List   Diagnosis Date Noted  . Carpal tunnel syndrome 09/19/2019  . Thoracic disc disorder 02/04/2018  . Inflammatory heel pain 02/22/2016  . Osteoarthritis, hand 02/15/2015  . Elbow pain, chronic, right 01/03/2015  . Cervical radiculopathy at C8 03/21/2014  . Degenerative disc disease, cervical 08/18/2013  . Degenerative arthritis of lumbar spine 08/18/2013  . Achilles tendinitis 01/06/2012  . HYPOTHYROIDISM 03/15/2009  . Plantar fasciitis, right 03/15/2009  . CAVUS DEFORMITY  OF FOOT, ACQUIRED 03/15/2009    Everardo All PT, DPT  09/12/20 4:59 PM    [facility] [address] [place], [place], [address] Phone: [phone]   Fax:  [phone]  Name: Jaia Alonge MRN: [id number] Date of Birth: 03-17-1961

## 2020-09-14 ENCOUNTER — Ambulatory Visit: Payer: Managed Care, Other (non HMO) | Admitting: Physical Therapy

## 2020-09-14 ENCOUNTER — Other Ambulatory Visit: Payer: Self-pay

## 2020-09-14 DIAGNOSIS — M5442 Lumbago with sciatica, left side: Secondary | ICD-10-CM

## 2020-09-14 DIAGNOSIS — M62830 Muscle spasm of back: Secondary | ICD-10-CM | POA: Diagnosis not present

## 2020-09-14 DIAGNOSIS — M6281 Muscle weakness (generalized): Secondary | ICD-10-CM

## 2020-09-14 NOTE — Therapy (Addendum)
[facility] [address], [place], [place], [address] Phone: [phone]   Fax:  [phone]  Physical Therapy Treatment  Patient Details  Name: Samantha [name] MRN: [id number] Date of Birth: Oct 29, 1960 Referring Provider (PT): Karlton Lemon, MD   Encounter Date: 09/14/2020   PT End of Session - 09/14/20 1201     Visit Number 5    Date for PT Re-Evaluation 09/28/20    Authorization Type [employer]    PT Start Time 1102    PT Stop Time 1144    PT Time Calculation (min) 42 min    Activity Tolerance Patient tolerated treatment well;No increased pain    Behavior During Therapy WFL for tasks assessed/performed             Past Medical History:  Diagnosis Date   Allergy    Depression    GERD (gastroesophageal reflux disease)    Right carpal tunnel syndrome 09/19/2019   Thyroid disease     Past Surgical History:  Procedure Laterality Date   TONSILLECTOMY      There were no vitals filed for this visit.   Subjective Assessment - 09/14/20 1155     Subjective Continues to have Lt hip pain. No radiating symptoms. Only point tenderness at Lt heel which is worse in the morning and improved throughout the day.    Pertinent History thoracic disc disease    Limitations Walking;House hold activities    How long can you sit comfortably? unlimited    How long can you stand comfortably? 5-10 minutes    How long can you walk comfortably? 30-45 minutes    Patient Stated Goals decreased pain; walk dog without pain    Currently in Pain? Yes    Pain Score 2     Pain Location Hip    Pain Orientation Left    Pain Descriptors / Indicators Discomfort                               [facility] Adult PT Treatment/Exercise - 09/14/20 0001       Lumbar Exercises: Stretches   Active Hamstring Stretch Left;2 reps;20 seconds    Active Hamstring Stretch Limitations seated    Piriformis Stretch Left;2 reps;20 seconds     Figure 4 Stretch 2 reps;20 seconds;Supine;With overpressure      Lumbar Exercises: Standing   Functional Squats 10 reps    Functional Squats Limitations ball squats at wall    Forward Lunge Limitations Lt SLS w/ slider lateral/back x10    Other Standing Lumbar Exercises anti rotation press with stir the pot; green tband; x15 Rt/Lt    Other Standing Lumbar Exercises resisted side stepping; yellow loop at knees; 20 feet x2      Lumbar Exercises: Supine   AB Set Limitations 90/90 with alt LE extension x10 Rt/Lt    Dead Bug 10 reps    Bridge Limitations 2 x 10; yellow loop at knees      Lumbar Exercises: Sidelying   Clam Both    Clam Limitations 2 x 10; yellow loop      Knee/Hip Exercises: Standing   Hip Abduction Both;1 set;10 reps;Knee bent    Abduction Limitations isometrics at wall                    PT Education - 09/14/20 1156     Education Details figure four stretch; anti rotation press with "  stir the pot", ab set with 90/90 LE raise    Person(s) Educated Patient    Methods Explanation;Demonstration;Tactile cues;Verbal cues;Handout    Comprehension Verbalized understanding;Returned demonstration;Verbal cues required;Tactile cues required              PT Short Term Goals - 09/14/20 1201       PT SHORT TERM GOAL #1   Title Patient will be independent with HEP for continued progression at home.    Time 3    Period Weeks    Status Achieved    Target Date 09/05/20      PT SHORT TERM GOAL #2   Title Patient will report standing tolerance increased to 30 minutes to more readily perform ADLs.    Baseline 5-10 minutes    Time 3    Period Weeks    Status Achieved    Target Date 09/05/20               PT Long Term Goals - 08/15/20 1450       PT LONG TERM GOAL #1   Title Patient will be independent with advanced HEP for long term management of symptoms post D/C.    Time 6    Period Weeks    Status New    Target Date 09/26/20      PT LONG TERM  GOAL #2   Title Patient will improve FOTO score to 67 or greater to indicate improved overall function.    Baseline 47    Time 6    Period Weeks    Status New    Target Date 09/26/20      PT LONG TERM GOAL #3   Title Patient will transition from standing to floor level and back to standing using good mechanics and without increased pain to more readily perform work duties.    Time 6    Period Weeks    Status New    Target Date 09/26/20                   Plan - 09/14/20 1156     Clinical Impression Statement Patient reporting some Lt hip relief following introduction to supine figure four stretch. Verbal cues for eccentric lowering when performing Lt sided clam indicating need for continued glute strengthening. Patient reporting significant exertion following anti rotation activity which indicates need for continued core stabilization as well. would benefit from continued skilled intervention for improved activity tolerance.    Personal Factors and Comorbidities Comorbidity 1    Comorbidities thoracic disc disease    Examination-Activity Limitations Caring for Others;Stand    Examination-Participation Restrictions Cleaning;Laundry;Occupation;Yard Work;Shop    Rehab Potential Good    PT Frequency 2x / week    PT Duration 6 weeks    PT Treatment/Interventions ADLs/Self Care Home Management;Aquatic Therapy;Cryotherapy;Electrical Stimulation;Moist Heat;Traction;Gait training;Functional mobility training;Therapeutic activities;Therapeutic exercise;Neuromuscular re-education;Patient/family education;Manual techniques;Dry needling;Passive range of motion;Spinal Manipulations;Joint Manipulations    PT Next Visit Plan progress glute and core strengthening; begin functional training to patient tolerance    PT Home Exercise Plan Access Code [email or web address]    Consulted and Agree with Plan of Care Patient             Patient will benefit from skilled therapeutic intervention in order to  improve the following deficits and impairments:  Decreased activity tolerance,Decreased endurance,Decreased strength,Difficulty walking,Increased fascial restricitons,Increased muscle spasms,Improper body mechanics,Pain  Visit Diagnosis: Muscle spasm of back  Muscle weakness (generalized)  Acute left-sided low back pain with left-sided  sciatica     Problem List Patient Active Problem List   Diagnosis Date Noted   Carpal tunnel syndrome 09/19/2019   Thoracic disc disorder 02/04/2018   Inflammatory heel pain 02/22/2016   Osteoarthritis, hand 02/15/2015   Elbow pain, chronic, right 01/03/2015   Cervical radiculopathy at C8 03/21/2014   Degenerative disc disease, cervical 08/18/2013   Degenerative arthritis of lumbar spine 08/18/2013   Achilles tendinitis 01/06/2012   HYPOTHYROIDISM 03/15/2009   Plantar fasciitis, right 03/15/2009   CAVUS DEFORMITY OF FOOT, ACQUIRED 03/15/2009   PHYSICAL THERAPY DISCHARGE SUMMARY  Visits from Start of Care: 5  Current functional level related to goals / functional outcomes: See above   Remaining deficits: See above   Education / Equipment: See above   Patient agrees to discharge. Patient goals were not met. Patient is being discharged due to not returning since the last visit.    Everardo All PT, DPT  09/14/20 12:02 PM   [facility] [address], [place], [place], [address] Phone: [phone]   Fax:  [phone]  Name: Samantha [name] MRN: [id number] Date of Birth: 07-06-1960

## 2020-09-14 NOTE — Patient Instructions (Signed)
Supine Piriformis Stretch - 1 x daily - 7 x weekly - 3 sets - 10 reps Supine 90/90 Abdominal Bracing - 1 x daily - 7 x weekly - 1 sets - 10 reps - 5s hold

## 2020-09-17 ENCOUNTER — Telehealth: Payer: Self-pay

## 2020-09-17 ENCOUNTER — Ambulatory Visit: Payer: Managed Care, Other (non HMO)

## 2020-09-17 NOTE — Telephone Encounter (Signed)
PT called pt due to no-show today.  Left message on voicemail.

## 2020-09-20 ENCOUNTER — Encounter: Payer: Managed Care, Other (non HMO) | Admitting: Physical Therapy

## 2020-09-24 ENCOUNTER — Ambulatory Visit: Payer: Managed Care, Other (non HMO) | Admitting: Physical Therapy

## 2020-09-26 ENCOUNTER — Ambulatory Visit (INDEPENDENT_AMBULATORY_CARE_PROVIDER_SITE_OTHER): Payer: Managed Care, Other (non HMO)

## 2020-09-26 ENCOUNTER — Other Ambulatory Visit: Payer: Self-pay

## 2020-09-26 ENCOUNTER — Ambulatory Visit: Payer: Managed Care, Other (non HMO) | Admitting: Family Medicine

## 2020-09-26 VITALS — BP 122/80 | Ht 67.5 in | Wt 175.0 lb

## 2020-09-26 DIAGNOSIS — J309 Allergic rhinitis, unspecified: Secondary | ICD-10-CM

## 2020-09-26 DIAGNOSIS — S86811A Strain of other muscle(s) and tendon(s) at lower leg level, right leg, initial encounter: Secondary | ICD-10-CM | POA: Diagnosis not present

## 2020-09-26 DIAGNOSIS — M5416 Radiculopathy, lumbar region: Secondary | ICD-10-CM | POA: Diagnosis not present

## 2020-09-26 NOTE — Patient Instructions (Signed)
I still think this is primarily referred from the L4 nerve root given how it's presenting, the severity at night, that your hip exam is normal. We will go ahead with an MRI of your lumbar spine. Ok to take tylenol for baseline pain relief (1-2 extra strength tabs 3x/day) Let me know if you want to do a combo of diclofenac with something like pepcid. Stay as active as possible. Ok to continue home exercises you were shown in therapy. I will call you with MRI results and next steps.  Your pain below the knee is an anterior tibialis muscle overuse strain. Heat 15 minutes at a time as needed. Do home exercises and stretches. Activities as tolerated without restrictions. Can consider compression sleeve.

## 2020-09-26 NOTE — Assessment & Plan Note (Addendum)
Most likely anterior tibialis strain. Offered pt NSAIDs but declined due to side effects. Tylenol PRN for pain. Exercises given to pt and recommended knee sleeve.

## 2020-09-26 NOTE — Assessment & Plan Note (Addendum)
Unlikely hip pathology causing sx. Could be lumbar radiculopathy causing pain given normal hip exam and pain which is worse at  night. Ordered lumbar MRI. Follow up with Dr Barbaraann Barthel for results.

## 2020-09-26 NOTE — Progress Notes (Addendum)
    SUBJECTIVE:   CHIEF COMPLAINT / HPI:   Samantha [name] is a 60 yr old female who presents with left hip pain and right knee pain  Right knee pain Started 6 weeks ago. Pain is on the lateral, anterior side of her lower leg just below knee. 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 She gets up off the floor by pressing on her right leg to help her. Diclofenac did help with the pain initially but she had a stomach upset. No acute injury.  Left gluteal pain Had improved slightly following PT. She has 5 sessions in total. She got COVID recently which meant she could not do more PT. Pain is now back to the initial pain which is sharp in nature. Non radiating. No numbness, weakness or parasthesias.   PERTINENT  PMH / PSH: hypothyroidism   OBJECTIVE:   BP 122/80   Ht 5' 7.5" (1.715 m)   Wt 175 lb (79.4 kg)   BMI 27.00 kg/m    Lumbar spine: No lumbar spinal tenderness  Hip:  - Inspection: No gross deformity, no swelling, erythema, or ecchymosis - Palpation: TTP over mid left gluteal region more superior than piriformis. No TTP over greater trochanter.  - ROM: Normal range of motion on flexion, extension, abduction, internal and external rotation - Strength: Normal strength - Reflexes 2+ bilaterally  - Neuro/vasc: NV intact distally - Special Tests: Negative FABER and negative FADIR.  Negative logroll.   Knee: - Inspection: no gross deformity. No swelling/effusion, erythema or bruising. Skin intact - Palpation: TTP proximal right anterior tibialis  - ROM: full active ROM with flexion and extension in knee and hip - Strength: 5/5 strength - Neuro/vasc: NV intact - Special Tests: - LIGAMENTS: negative anterior and posterior drawer, negative Lachman's, no MCL or LCL laxity  -- MENISCUS: negative McMurray's, negative Thessaly   ASSESSMENT/PLAN:   Radiculopathy of lumbar region Unlikely hip pathology causing sx. Could be lumbar radiculopathy causing pain given  normal hip exam and pain which is worse at  night. Ordered lumbar MRI. Follow up with Dr Barbaraann Barthel for results.   Strain of right tibialis anterior muscle Most likely anterior tibialis strain. Offered pt NSAIDs but declined due to side effects. Tylenol PRN for pain. Exercises given to pt and recommended knee sleeve.      [name], MD [place]   Addendum 7/15: MRI reviewed and discussed with patient.  She has left L5 radiculopathy from disc protrusion but also spinal stenosis that is severe at L3-4 and moderate at L4-5 levels.  Discussed options - she feels about the same compared to last visit - will go ahead with physical therapy with these confirmed diagnoses, she will consider ESI if not improving over the next few weeks.

## 2020-09-27 ENCOUNTER — Encounter: Payer: Self-pay | Admitting: Family Medicine

## 2020-10-05 ENCOUNTER — Ambulatory Visit (INDEPENDENT_AMBULATORY_CARE_PROVIDER_SITE_OTHER): Payer: Managed Care, Other (non HMO)

## 2020-10-05 DIAGNOSIS — J309 Allergic rhinitis, unspecified: Secondary | ICD-10-CM

## 2020-10-11 ENCOUNTER — Ambulatory Visit: Payer: Managed Care, Other (non HMO) | Admitting: Family Medicine

## 2020-10-11 ENCOUNTER — Ambulatory Visit (INDEPENDENT_AMBULATORY_CARE_PROVIDER_SITE_OTHER): Payer: Managed Care, Other (non HMO)

## 2020-10-11 DIAGNOSIS — J309 Allergic rhinitis, unspecified: Secondary | ICD-10-CM

## 2020-10-18 ENCOUNTER — Ambulatory Visit (INDEPENDENT_AMBULATORY_CARE_PROVIDER_SITE_OTHER): Payer: Managed Care, Other (non HMO) | Admitting: *Deleted

## 2020-10-18 ENCOUNTER — Ambulatory Visit: Payer: Managed Care, Other (non HMO) | Admitting: Family Medicine

## 2020-10-18 DIAGNOSIS — J309 Allergic rhinitis, unspecified: Secondary | ICD-10-CM

## 2020-10-25 ENCOUNTER — Ambulatory Visit (INDEPENDENT_AMBULATORY_CARE_PROVIDER_SITE_OTHER): Payer: Managed Care, Other (non HMO) | Admitting: *Deleted

## 2020-10-25 DIAGNOSIS — J309 Allergic rhinitis, unspecified: Secondary | ICD-10-CM | POA: Diagnosis not present

## 2020-10-26 ENCOUNTER — Other Ambulatory Visit: Payer: Self-pay

## 2020-10-26 DIAGNOSIS — M5416 Radiculopathy, lumbar region: Secondary | ICD-10-CM

## 2020-11-06 ENCOUNTER — Other Ambulatory Visit: Payer: Self-pay

## 2020-11-06 DIAGNOSIS — M5416 Radiculopathy, lumbar region: Secondary | ICD-10-CM

## 2020-11-07 ENCOUNTER — Other Ambulatory Visit: Payer: Self-pay | Admitting: Family Medicine

## 2020-11-07 DIAGNOSIS — M5416 Radiculopathy, lumbar region: Secondary | ICD-10-CM

## 2020-11-08 NOTE — Patient Instructions (Addendum)
Allergic rhinitis - May use saline nasal gel (Ayr or other brands) to help with nasal dryness  - continue avoidance measures for mugwort (weed pollen), oak (tree pollen), molds.    -continue allergen immunotherapy at maintenance dosing and have access to epinephrine autoinjector device.  -Continue Allegra '180mg'$  daily as needed and on days of your allergy injections  - Xhance, Xyzal and Singulair stopped due to adverse effects.  Itching  - avoidance measures as above  - continue use of clobetasol as directed by dermatology  - continue use of eucrisa as needed for itch/rash control  Follow-up 12 months or sooner if needed

## 2020-11-09 ENCOUNTER — Ambulatory Visit: Payer: Managed Care, Other (non HMO) | Admitting: Family

## 2020-11-09 ENCOUNTER — Other Ambulatory Visit: Payer: Self-pay

## 2020-11-09 ENCOUNTER — Encounter: Payer: Self-pay | Admitting: Family

## 2020-11-09 VITALS — BP 124/80 | HR 76 | Temp 97.2°F | Resp 16 | Ht 67.5 in | Wt 181.4 lb

## 2020-11-09 DIAGNOSIS — J3089 Other allergic rhinitis: Secondary | ICD-10-CM | POA: Diagnosis not present

## 2020-11-09 DIAGNOSIS — L299 Pruritus, unspecified: Secondary | ICD-10-CM

## 2020-11-09 MED ORDER — EPINEPHRINE 0.3 MG/0.3ML IJ SOAJ: 0.3000 mg | INTRAMUSCULAR | 1 refills | Status: DC | PRN

## 2020-11-09 NOTE — Progress Notes (Signed)
[place] [address] Dept: [phone]  FOLLOW UP NOTE  Patient ID: Samantha [name], female    DOB: 21-Sep-1960  Age: 60 y.o. MRN: [id number] Date of Office Visit: 11/09/2020  Assessment  Chief Complaint: Allergic Rhinitis  (no issues )  HPI Samantha [name] is a 60 year old female who presents today for follow-up of allergic rhinitis and itching.  She was last seen on Sep 08, 2019 by Dr. Nelva Bush.  Allergic rhinitis is reported as moderately controlled with Allegra 180 mg on injection days, saline spray as needed, and allergy injections per protocol.  She reports nasal dryness and denies rhinorrhea, nasal congestion, and postnasal drip.  She has not had any sinus infections since we last saw her.  She reports that she has tried saline spray to help for the nasal dryness, but this does not really help.  She has not ever tried saline gel.  She does feel like her allergy injections help and she denies any large local reactions.  Itching is reported as moderately controlled with clobetasol as needed and Eucrisa as needed.  She reports that she has only had to use the clobetasol a couple times since we last saw her.  She reports that when she has the itching it is between her fingers and on the tops of her feet.  She never sees any rashes or hives when the itching occurs   Drug Allergies:  Allergies  Allergen Reactions   Sulfonamide Derivatives Nausea And Vomiting   Nortriptyline Other (See Comments)    depression   Omeprazole Hives   Singulair [Montelukast Sodium] Other (See Comments)    Depressed mood    Review of Systems: Review of Systems  Constitutional:  Negative for chills and fever.  HENT:         Reports nasal dryness and denies rhinorrhea, nasal congestion, and postnasal drip  Eyes:        Denies itchy watery eyes  Respiratory:  Negative for cough, shortness of breath and wheezing.   Cardiovascular:  Negative for chest pain and palpitations.   Gastrointestinal:  Positive for heartburn.       Reports a little bit of heartburn for the past few days since being on Aleve due to having a pinched L5 nerve  Genitourinary:  Negative for dysuria.  Skin:  Negative for itching and rash.  Neurological:  Negative for headaches.  Endo/Heme/Allergies:  Positive for environmental allergies.    Physical Exam: BP 124/80   Pulse 76   Temp (!) 97.2 F (36.2 C)   Resp 16   Ht 5' 7.5" (1.715 m)   Wt 181 lb 6.4 oz (82.3 kg)   SpO2 98%   BMI 27.99 kg/m    Physical Exam Constitutional:      Appearance: Normal appearance.  HENT:     Head: Normocephalic and atraumatic.     Comments: Pharynx normal, eyes normal, ears normal, nose normal    Right Ear: Tympanic membrane, ear canal and external ear normal.     Left Ear: Tympanic membrane, ear canal and external ear normal.     Nose: Nose normal.     Mouth/Throat:     Mouth: Mucous membranes are moist.     Pharynx: Oropharynx is clear.  Eyes:     Conjunctiva/sclera: Conjunctivae normal.  Cardiovascular:     Rate and Rhythm: Normal rate and regular rhythm.     Heart sounds: Normal heart sounds.  Pulmonary:     Effort: Pulmonary effort is  normal.     Breath sounds: Normal breath sounds.     Comments: Lungs clear to auscultation Musculoskeletal:     Cervical back: Neck supple.  Skin:    General: Skin is warm.     Comments: No rashes or urticarial lesions noted  Neurological:     Mental Status: She is alert and oriented to person, place, and time.  Psychiatric:        Mood and Affect: Mood normal.        Behavior: Behavior normal.        Thought Content: Thought content normal.        Judgment: Judgment normal.    Diagnostics:  None  Assessment and Plan: 1. Non-seasonal allergic rhinitis due to other allergic trigger   2. Pruritus     No orders of the defined types were placed in this encounter.   Patient Instructions  Allergic rhinitis - May use saline nasal gel (Ayr or  other brands) to help with nasal dryness  - continue avoidance measures for mugwort (weed pollen), oak (tree pollen), molds.    -continue allergen immunotherapy at maintenance dosing and have access to epinephrine autoinjector device.  -Continue Allegra '180mg'$  daily as needed and on days of your allergy injections  - Xhance, Xyzal and Singulair stopped due to adverse effects.  Itching  - avoidance measures as above  - continue use of clobetasol as directed by dermatology  - continue use of eucrisa as needed for itch/rash control  Follow-up 12 months or sooner if needed  Return in about 1 year (around 11/09/2021), or if symptoms worsen or fail to improve.    Thank you for the opportunity to care for this patient.  Please do not hesitate to contact me with questions.  Althea Charon, FNP [facility] [place] [facility]

## 2020-11-12 ENCOUNTER — Other Ambulatory Visit: Payer: Self-pay

## 2020-11-12 ENCOUNTER — Ambulatory Visit
Admission: RE | Admit: 2020-11-12 | Discharge: 2020-11-12 | Disposition: A | Discharge status: Home / Self Care | Payer: Managed Care, Other (non HMO) | Source: Ambulatory Visit | Attending: Family Medicine | Admitting: Family Medicine

## 2020-11-12 DIAGNOSIS — M5416 Radiculopathy, lumbar region: Secondary | ICD-10-CM

## 2020-11-12 MED ORDER — METHYLPREDNISOLONE ACETATE 40 MG/ML INJ SUSP (RADIOLOG
80.0000 mg | Freq: Once | INTRAMUSCULAR | Status: AC
  Administered 2020-11-12: 80 mg via EPIDURAL

## 2020-11-12 MED ORDER — IOPAMIDOL (ISOVUE-M 200) INJECTION 41%
1.0000 mL | Freq: Once | INTRAMUSCULAR | Status: AC
  Administered 2020-11-12: 1 mL via EPIDURAL

## 2020-11-12 NOTE — Discharge Instructions (Signed)

## 2020-11-14 ENCOUNTER — Ambulatory Visit: Payer: Managed Care, Other (non HMO) | Admitting: Physical Therapy

## 2020-11-16 ENCOUNTER — Encounter: Payer: Self-pay | Admitting: Physical Therapy

## 2020-11-16 ENCOUNTER — Other Ambulatory Visit: Payer: Self-pay

## 2020-11-16 ENCOUNTER — Ambulatory Visit: Payer: Managed Care, Other (non HMO) | Attending: Family Medicine | Admitting: Physical Therapy

## 2020-11-16 DIAGNOSIS — M542 Cervicalgia: Secondary | ICD-10-CM

## 2020-11-16 DIAGNOSIS — M546 Pain in thoracic spine: Secondary | ICD-10-CM

## 2020-11-16 DIAGNOSIS — M5442 Lumbago with sciatica, left side: Secondary | ICD-10-CM | POA: Insufficient documentation

## 2020-11-16 DIAGNOSIS — M6281 Muscle weakness (generalized): Secondary | ICD-10-CM | POA: Diagnosis present

## 2020-11-16 DIAGNOSIS — G8929 Other chronic pain: Secondary | ICD-10-CM | POA: Diagnosis present

## 2020-11-16 NOTE — Therapy (Signed)
[facility] [address], [place], [place], [address] Phone: [phone]   Fax:  [phone]  Physical Therapy Evaluation  Patient Details  Name: Samantha [name] MRN: [id number] Date of Birth: July 01, 1960 Referring Provider (PT): Dr. Karlton Lemon   Encounter Date: 11/16/2020   PT End of Session - 11/16/20 0951     Visit Number 1    Number of Visits 15    Date for PT Re-Evaluation 01/11/21    Authorization Type cigna 20 visit limit -had 5 visits this year    PT Start Time 0803    PT Stop Time 0843    PT Time Calculation (min) 40 min    Activity Tolerance Patient tolerated treatment well             Past Medical History:  Diagnosis Date   Allergy    Depression    GERD (gastroesophageal reflux disease)    Right carpal tunnel syndrome 09/19/2019   Thyroid disease     Past Surgical History:  Procedure Laterality Date   TONSILLECTOMY      There were no vitals filed for this visit.    Subjective Assessment - 11/16/20 0806     Subjective Suspects loading a dumpster when helped family move in February.  Left lumbosacral pain.  Had cortisone shot on Monday and beginning to help.  Had had left thigh, knee, ankle and foot pain but better since the injection. Difficulty sleeping.    Pertinent History on/off neck, thoracic pain with previous PT    Limitations House hold activities    How long can you sit comfortably? 1 hour    How long can you walk comfortably? 30 min shopping  will go all the way across    Diagnostic tests [facility] L3-L5 DDD  compromised nerve at L5    Patient Stated Goals painfree; learn how to pick up grandchildren 2 year and twin 57 year olds;   good posture; get back into alignment    Currently in Pain? Yes    Pain Location Hip    Pain Orientation Left    Pain Type Chronic pain    Pain Radiating Towards Left LE to foot    Pain Onset More than a month ago    Pain Frequency Constant    Aggravating  Factors  turning over in bed; worse at night; picking up grandchildren; bending over to clean tub    Pain Relieving Factors sit in recliner with legs propped                [facility] PT Assessment - 11/16/20 0001       Assessment   Medical Diagnosis lumbar radiculopathy    Referring Provider (PT) Dr. Karlton Lemon    Onset Date/Surgical Date --   February   Next MD Visit as needed    Prior Therapy for back a few years ago      Precautions   Precautions None      Restrictions   Weight Bearing Restrictions No      Balance Screen   Has the patient fallen in the past 6 months No    Has the patient had a decrease in activity level because of a fear of falling?  No   I think it's off b/c of left leg   Is the patient reluctant to leave their home because of a fear of falling?  No      Home Ecologist  residence    Type of [place] Access Level entry    [place] One level      Prior Function   Level of [place] Retired    Leisure read; working in yard      Observation/Other Assessments   Focus on Therapeutic Outcomes (FOTO)  59%      Functional Tests   Functional tests Other   fair hip hinge would benefit from lift training     Posture/Postural Control   Posture Comments difficulty single leg standing on left < 10 sec      AROM   Lumbar Flexion 40    Lumbar Extension 10    Lumbar - Right Side Bend 30    Lumbar - Left Side Bend 25      Strength   Overall Strength Comments Able to rise sit to stand without UE assist; great difficulty with left single leg heel raise 3x limited heel clearance; bridge in painful;  quadruped position is "a relief"    Right Hip Extension 4+/5    Right Hip ABduction 4+/5    Left Hip Extension 4-/5    Left Hip ABduction 3+/5      Flexibility   Soft Tissue Assessment /Muscle Length yes    Quadriceps dec right hip flexor length      Palpation   Palpation comment no  tender points in gluteal      Slump test   Findings Positive    Side Left      Prone Knee Bend Test   Findings Positive    Comment both sides cause LBP                        Objective measurements completed on examination: See above findings.               PT Education - 11/16/20 0950     Education Details trunk rotation; 90/90 transverse ab; piriformis stretch    Person(s) Educated Patient    Methods Explanation;Demonstration;Handout    Comprehension Verbalized understanding;Returned demonstration              PT Short Term Goals - 11/16/20 1218       PT SHORT TERM GOAL #1   Title The patient will have knowledge of basic self care strategies and initial HEP to relieve pain    Time 4    Period Weeks    Status New    Target Date 12/14/20      PT SHORT TERM GOAL #2   Title The patient will demonstrate good hip hinge technique with lifting and home ADLS    Time 4    Period Weeks    Status New      PT SHORT TERM GOAL #3   Title The patient will report a 30% improvement in back pain    Time 4    Period Weeks    Status New      PT SHORT TERM GOAL #4   Title Lumbar ffexion 50 degrees, extension 15 degrees and symmetrical sidebend to 30 degrees needed for on/off the floor to play with grandchildren    Time 4    Period Weeks    Status New               PT Long Term Goals - 11/16/20 1221       PT LONG TERM GOAL #1  Title Patient will be independent with advanced HEP for long term management of symptoms post D/C.    Time 8    Period Weeks    Status New    Target Date 01/11/21      PT LONG TERM GOAL #2   Title Patient will improve FOTO score to 69 or greater to indicate improved overall function.    Time 8    Period Weeks    Status New      PT LONG TERM GOAL #3   Title Patient will transition from standing to floor level and back to standing using good mechanics and without increased pain to more readily perform work  duties.    Time 8    Period Weeks    Status New      PT LONG TERM GOAL #4   Title Left LE strength to grossly 4+/5 and trunk strength 5-/5 needed for lifting her grandchildren    Time 8    Period Weeks    Status New      PT LONG TERM GOAL #5   Title .Marland KitchenMarland Kitchen                    Plan - 11/16/20 1002     Clinical Impression Statement The patient is referred to PT for lumbar radiculopathy.  She has had some LBP in years past but had a flare up in February as the result of loading a dumpster when helping family members move.  She had 5 visits of PT in May/June but had to discontinue when she got Covid.  She had a cortisone injection earlier this week and reports some relief of left lumbosacral region pain and left thigh, knee, ankle and foot pain.  Her sleep is disturbed with night pain.  Symptoms are aggravated with turning over in bed, lifting her grandchildren and bending over to clean the tub. Limited lumbar ROM: flexion 40 degrees, extension 10 degrees, right sidebend 30 degrees, left sidebend 25 degrees,  No directional preference identified.  Decreased activation of transverse abdominus and lumbar multifidi.  Decreased left LE strength noted particularly gluteals 3+/5 to 4-/5 and gastroc 4/5.  + left slump test and bil prone knee bend tests.  She would benefit from PT to address these deficits.    Personal Factors and Comorbidities Time since onset of injury/illness/exacerbation    Examination-Activity Limitations Sleep;Lift;Caring for Others;Carry    Examination-Participation Restrictions Cleaning;Other;Yard Work    Stability/Clinical Decision Making Stable/Uncomplicated    Designer, jewellery Low    Rehab Potential Good    PT Frequency 2x / week    PT Duration 8 weeks    PT Treatment/Interventions ADLs/Self Care Home Management;Aquatic Therapy;Cryotherapy;Electrical Stimulation;Ultrasound;Traction;Moist Heat;Iontophoresis '4mg'$ /ml Dexamethasone;Therapeutic  activities;Therapeutic exercise;Neuromuscular re-education;Manual techniques;Patient/family education;Dry needling;Taping;Spinal Manipulations    PT Next Visit Plan supine neural floss (painful in past);  neutral spine lumbopelvic/hip strengthening to help with turning in bed;   left LE strengthening (glutes and gastroc);  thoracic spine extension seated;   teach hip hinge    Consulted and Agree with Plan of Care Patient             Patient will benefit from skilled therapeutic intervention in order to improve the following deficits and impairments:  Decreased range of motion, Pain, Decreased strength, Decreased activity tolerance, Impaired perceived functional ability, Improper body mechanics  Visit Diagnosis: Chronic left-sided low back pain with left-sided sciatica - Plan: PT plan of care cert/re-cert  Muscle weakness (generalized) - Plan:  PT plan of care cert/re-cert     Problem List Patient Active Problem List   Diagnosis Date Noted   Radiculopathy of lumbar region 09/26/2020   Strain of right tibialis anterior muscle 09/26/2020   Carpal tunnel syndrome 09/19/2019   Thoracic disc disorder 02/04/2018   Inflammatory heel pain 02/22/2016   Osteoarthritis, hand 02/15/2015   Elbow pain, chronic, right 01/03/2015   Cervical radiculopathy at C8 03/21/2014   Degenerative disc disease, cervical 08/18/2013   Degenerative arthritis of lumbar spine 08/18/2013   Achilles tendinitis 01/06/2012   HYPOTHYROIDISM 03/15/2009   Plantar fasciitis, right 03/15/2009   CAVUS DEFORMITY OF FOOT, ACQUIRED 03/15/2009   Ruben Im, PT 11/16/20 12:26 PM Phone: [phone] Fax: [phone]  Alvera Singh 11/16/2020, 12:26 PM  [facility] [address] [place], [place], [address] Phone: [phone]   Fax:  [phone]  Name: Veta Bringman MRN: [id number] Date of Birth: [birth year]

## 2020-11-16 NOTE — Patient Instructions (Signed)
Access Code: [email or web address] URL: [email or web address] Date: 11/16/2020 Prepared by: Ruben Im  Exercises Supine Lower Trunk Rotation - 2 x daily - 7 x weekly - 1 sets - 3 reps - 10s hold Supine Piriformis Stretch with Leg Straight - 2 x daily - 7 x weekly - 1 sets - 2 reps - 15s hold Supine Piriformis Stretch - 1 x daily - 7 x weekly - 3 sets - 10 reps Supine 90/90 Abdominal Bracing - 1 x daily - 7 x weekly - 1 sets - 10 reps - 5s hold

## 2020-11-22 ENCOUNTER — Ambulatory Visit (INDEPENDENT_AMBULATORY_CARE_PROVIDER_SITE_OTHER): Payer: Managed Care, Other (non HMO) | Admitting: *Deleted

## 2020-11-22 ENCOUNTER — Other Ambulatory Visit: Payer: Self-pay

## 2020-11-22 ENCOUNTER — Ambulatory Visit: Payer: Managed Care, Other (non HMO) | Attending: Family Medicine | Admitting: Physical Therapy

## 2020-11-22 DIAGNOSIS — M6281 Muscle weakness (generalized): Secondary | ICD-10-CM

## 2020-11-22 DIAGNOSIS — G8929 Other chronic pain: Secondary | ICD-10-CM | POA: Diagnosis present

## 2020-11-22 DIAGNOSIS — J309 Allergic rhinitis, unspecified: Secondary | ICD-10-CM | POA: Diagnosis not present

## 2020-11-22 DIAGNOSIS — M5442 Lumbago with sciatica, left side: Secondary | ICD-10-CM | POA: Insufficient documentation

## 2020-11-22 DIAGNOSIS — M546 Pain in thoracic spine: Secondary | ICD-10-CM | POA: Insufficient documentation

## 2020-11-22 DIAGNOSIS — M62830 Muscle spasm of back: Secondary | ICD-10-CM | POA: Diagnosis present

## 2020-11-22 NOTE — Therapy (Signed)
[facility] [address], [place], [place], [address] Phone: [phone]   Fax:  [phone]  Physical Therapy Treatment  Patient Details  Name: Samantha [name] MRN: [id number] Date of Birth: Jun 15, 1960 Referring Provider (PT): Dr. Karlton Lemon   Encounter Date: 11/22/2020   PT End of Session - 11/22/20 1749     Visit Number 2    Number of Visits 15    Date for PT Re-Evaluation 01/11/21    Authorization Type cigna 20 visit limit -had 5 visits this year so 15 total remaining    PT Start Time 1020    PT Stop Time 1100    PT Time Calculation (min) 40 min    Activity Tolerance Patient tolerated treatment well             Past Medical History:  Diagnosis Date   Allergy    Depression    GERD (gastroesophageal reflux disease)    Right carpal tunnel syndrome 09/19/2019   Thyroid disease     Past Surgical History:  Procedure Laterality Date   TONSILLECTOMY      There were no vitals filed for this visit.   Subjective Assessment - 11/22/20 1025     Subjective I am better.  Just tingles posterior hip/thigh. I want to know how to lift children.    Pertinent History on/off neck, thoracic pain with previous PT    Patient Stated Goals painfree; learn how to pick up grandchildren 2 year and twin 67 year olds;   good posture; get back into alignment    Currently in Pain? No/denies    Pain Score 0-No pain    Pain Type Chronic pain                               [facility] Adult PT Treatment/Exercise - 11/22/20 0001       Therapeutic Activites    Therapeutic Activities ADL's    ADL's abdominal brace and ball squeeze with rolling      Lumbar Exercises: Standing   Other Standing Lumbar Exercises hip hinge with golf club/dowel as precursor to prepare for progression with load (picking up grandchildren)      Lumbar Exercises: Seated   Sit to Stand 5 reps    Sit to Stand Limitations using dowel for hip  hinge    Other Seated Lumbar Exercises thoracic extension with ball as fulcum 12x      Lumbar Exercises: Supine   Ab Set 5 reps    Bent Knee Raise 10 reps    Bent Knee Raise Limitations with exhale with lift/lowers of legs    Isometric Hip Flexion 5 reps;5 seconds    Isometric Hip Flexion Limitations exhale with self resist    Other Supine Lumbar Exercises manually resisted hands and knees resist anti rotary movements      Lumbar Exercises: Sidelying   Clam Right;Left;10 reps    Clam Limitations small ball between feet      Lumbar Exercises: Prone   Other Prone Lumbar Exercises forearm/knees modified plank 5 sec hold 10x                    PT Education - 11/22/20 1748     Education Details modified plank; hip hinge    Person(s) Educated Patient    Methods Explanation;Demonstration;Handout    Comprehension Returned demonstration;Verbalized understanding  PT Short Term Goals - 11/16/20 1218       PT SHORT TERM GOAL #1   Title The patient will have knowledge of basic self care strategies and initial HEP to relieve pain    Time 4    Period Weeks    Status New    Target Date 12/14/20      PT SHORT TERM GOAL #2   Title The patient will demonstrate good hip hinge technique with lifting and home ADLS    Time 4    Period Weeks    Status New      PT SHORT TERM GOAL #3   Title The patient will report a 30% improvement in back pain    Time 4    Period Weeks    Status New      PT SHORT TERM GOAL #4   Title Lumbar ffexion 50 degrees, extension 15 degrees and symmetrical sidebend to 30 degrees needed for on/off the floor to play with grandchildren    Time 4    Period Weeks    Status New               PT Long Term Goals - 11/16/20 1221       PT LONG TERM GOAL #1   Title Patient will be independent with advanced HEP for long term management of symptoms post D/C.    Time 8    Period Weeks    Status New    Target Date 01/11/21      PT  LONG TERM GOAL #2   Title Patient will improve FOTO score to 69 or greater to indicate improved overall function.    Time 8    Period Weeks    Status New      PT LONG TERM GOAL #3   Title Patient will transition from standing to floor level and back to standing using good mechanics and without increased pain to more readily perform work duties.    Time 8    Period Weeks    Status New      PT LONG TERM GOAL #4   Title Left LE strength to grossly 4+/5 and trunk strength 5-/5 needed for lifting her grandchildren    Time 8    Period Weeks    Status New      PT LONG TERM GOAL #5   Title .Marland KitchenMarland Kitchen                   Plan - 11/22/20 1749     Clinical Impression Statement Treatment focus on lumbo/pelvic/hip strengthening to address issue with turning in bed with pain.  She reports no pain with rolling on the mat today while activating transverse abdominus  and hip adductor muscles.  Also instructed in hip hinge method to prepare for a progression to lifting (necessary for lifting her [place] grandchildren).  Therapist monitoring technique and offering cues.  Cues to avoid holding her breath with abdominal activation.    Comorbidities thoracic disc disease    Examination-Participation Restrictions Cleaning;Other;Yard Work    Rehab Potential Good    PT Frequency 2x / week    PT Duration 8 weeks    PT Treatment/Interventions ADLs/Self Care Home Management;Aquatic Therapy;Cryotherapy;Electrical Stimulation;Ultrasound;Traction;Moist Heat;Iontophoresis '4mg'$ /ml Dexamethasone;Therapeutic activities;Therapeutic exercise;Neuromuscular re-education;Manual techniques;Patient/family education;Dry needling;Taping;Spinal Manipulations    PT Next Visit Plan review hip hinge and add light weights; core strengthening; thoracic extension    PT Home Exercise Plan Access Code [email or web address]    Recommended Other  Services also has MD order for thoracic region             Patient will benefit from skilled  therapeutic intervention in order to improve the following deficits and impairments:  Decreased range of motion, Pain, Decreased strength, Decreased activity tolerance, Impaired perceived functional ability, Improper body mechanics  Visit Diagnosis: Chronic left-sided low back pain with left-sided sciatica  Muscle weakness (generalized)     Problem List Patient Active Problem List   Diagnosis Date Noted   Radiculopathy of lumbar region 09/26/2020   Strain of right tibialis anterior muscle 09/26/2020   Carpal tunnel syndrome 09/19/2019   Thoracic disc disorder 02/04/2018   Inflammatory heel pain 02/22/2016   Osteoarthritis, hand 02/15/2015   Elbow pain, chronic, right 01/03/2015   Cervical radiculopathy at C8 03/21/2014   Degenerative disc disease, cervical 08/18/2013   Degenerative arthritis of lumbar spine 08/18/2013   Achilles tendinitis 01/06/2012   HYPOTHYROIDISM 03/15/2009   Plantar fasciitis, right 03/15/2009   CAVUS DEFORMITY OF FOOT, ACQUIRED 03/15/2009   Ruben Im, PT 11/22/20 5:56 PM Phone: [phone] Fax: [phone]   Alvera Singh 11/22/2020, 5:56 PM  [facility] [address] [place], [place], [address] Phone: [phone]   Fax:  [phone]  Name: Samantha [name] MRN: [id number] Date of Birth: 02-13-1961

## 2020-11-22 NOTE — Patient Instructions (Signed)
Access Code: [email or web address] URL: [email or web address] Date: 11/22/2020 Prepared by: Ruben Im  Exercises Supine Lower Trunk Rotation - 2 x daily - 7 x weekly - 1 sets - 3 reps - 10s hold Supine Piriformis Stretch with Leg Straight - 2 x daily - 7 x weekly - 1 sets - 2 reps - 15s hold Supine Piriformis Stretch - 1 x daily - 7 x weekly - 3 sets - 10 reps Supine 90/90 Abdominal Bracing - 1 x daily - 7 x weekly - 1 sets - 10 reps - 5s hold Clam - 1 x daily - 7 x weekly - 1 sets - 10 reps Plank on Knees - 1 x daily - 7 x weekly - 1 sets - 10 reps - 5 hold Standing Hip Hinge with Dowel - 1 x daily - 7 x weekly - 1 sets - 10 reps Seated Thoracic Lumbar Extension with Pectoralis Stretch - 1 x daily - 7 x weekly - 1 sets - 10 reps

## 2020-11-26 ENCOUNTER — Ambulatory Visit: Payer: Managed Care, Other (non HMO) | Admitting: Physical Therapy

## 2020-11-26 ENCOUNTER — Other Ambulatory Visit: Payer: Self-pay

## 2020-11-26 DIAGNOSIS — M546 Pain in thoracic spine: Secondary | ICD-10-CM

## 2020-11-26 DIAGNOSIS — M5442 Lumbago with sciatica, left side: Secondary | ICD-10-CM | POA: Diagnosis not present

## 2020-11-26 DIAGNOSIS — M62830 Muscle spasm of back: Secondary | ICD-10-CM

## 2020-11-26 DIAGNOSIS — M6281 Muscle weakness (generalized): Secondary | ICD-10-CM

## 2020-11-26 DIAGNOSIS — G8929 Other chronic pain: Secondary | ICD-10-CM

## 2020-11-26 NOTE — Therapy (Signed)
[facility] [address] [place], [place], [address] Phone: [phone]   Fax:  [phone]  Physical Therapy Treatment  Patient Details  Name: Samantha [name] MRN: [id number] Date of Birth: 03/16/1961 Referring Provider (PT): Dr. Karlton Lemon   Encounter Date: 11/26/2020   PT End of Session - 11/26/20 1730     Visit Number 3    Number of Visits 15    Date for PT Re-Evaluation 01/11/21    Authorization Type cigna 20 visit limit -had 5 visits this year    Authorization - Visit Number 3    Authorization - Number of Visits 15    PT Start Time 1536   patient arriving late   PT Stop Time 1615    PT Time Calculation (min) 39 min    Activity Tolerance Patient tolerated treatment well    Behavior During Therapy [facility] for tasks assessed/performed             Past Medical History:  Diagnosis Date   Allergy    Depression    GERD (gastroesophageal reflux disease)    Right carpal tunnel syndrome 09/19/2019   Thyroid disease     Past Surgical History:  Procedure Laterality Date   TONSILLECTOMY      There were no vitals filed for this visit.   Subjective Assessment - 11/26/20 1539     Subjective Patient reports that she is feeling much better. Continues to hvae mild tingling at posterior Lt thight.    Pertinent History on/off neck, thoracic pain with previous PT    Limitations House hold activities    How long can you stand comfortably? 5-10 minutes    How long can you walk comfortably? 30 min shopping  will go all the way across    Diagnostic tests [facility] L3-L5 DDD  compromised nerve at L5    Patient Stated Goals painfree; learn how to pick up grandchildren 2 year and twin 6 year olds;   good posture; get back into alignment    Currently in Pain? Yes    Pain Score 1     Pain Location Buttocks    Pain Orientation Left;Posterior    Pain Descriptors / Indicators Tingling    Pain Type Chronic pain    Pain Onset More  than a month ago                               [facility] Adult PT Treatment/Exercise - 11/26/20 0001       Therapeutic Activites    Therapeutic Activities ADL's    ADL's hip hinge with 5# kb x 10 repetitions      Lumbar Exercises: Standing   Other Standing Lumbar Exercises hip hinge with golf club/dowel    Other Standing Lumbar Exercises resisted walking, 20#; x4 trips each direction      Lumbar Exercises: Seated   Sit to Stand 5 reps;10 reps    Sit to Stand Limitations using dowel for hip hinge    Other Seated Lumbar Exercises thoracic extension with ball as fulcum 12x      Lumbar Exercises: Supine   Ab Set 10 reps;5 seconds    Bent Knee Raise 10 reps    Bent Knee Raise Limitations with exhale with lift/lowers of legs    Isometric Hip Flexion 5 reps;5 seconds      Lumbar Exercises: Sidelying   Clam Right;Left;10 reps    Clam  Limitations small ball between feet      Lumbar Exercises: Prone   Other Prone Lumbar Exercises forearm/knees modified plank 10 sec hold x 5                      PT Short Term Goals - 11/26/20 1729       PT SHORT TERM GOAL #1   Title The patient will have knowledge of basic self care strategies and initial HEP to relieve pain    Time 4    Period Weeks    Status On-going    Target Date 12/14/20               PT Long Term Goals - 11/16/20 1221       PT LONG TERM GOAL #1   Title Patient will be independent with advanced HEP for long term management of symptoms post D/C.    Time 8    Period Weeks    Status New    Target Date 01/11/21      PT LONG TERM GOAL #2   Title Patient will improve FOTO score to 69 or greater to indicate improved overall function.    Time 8    Period Weeks    Status New      PT LONG TERM GOAL #3   Title Patient will transition from standing to floor level and back to standing using good mechanics and without increased pain to more readily perform work duties.    Time 8    Period  Weeks    Status New      PT LONG TERM GOAL #4   Title Left LE strength to grossly 4+/5 and trunk strength 5-/5 needed for lifting her grandchildren    Time 8    Period Weeks    Status New      PT LONG TERM GOAL #5   Title .Marland KitchenMarland Kitchen                   Plan - 11/26/20 1727     Clinical Impression Statement Patient demonstrates good carryover of previous session as she performed proper hip hinge with minimal cuing. Cuing for continued breathing with ab set to avoid valsalva manuever. Intermittent CGA provided during resisted walking activity due to loss of balance secondary to impaired lumbopelvic stability.    Personal Factors and Comorbidities Time since onset of injury/illness/exacerbation    Comorbidities thoracic disc disease    Examination-Activity Limitations Sleep;Lift;Caring for Others;Carry    Rehab Potential Good    PT Frequency 2x / week    PT Duration 8 weeks    PT Treatment/Interventions ADLs/Self Care Home Management;Aquatic Therapy;Cryotherapy;Electrical Stimulation;Ultrasound;Traction;Moist Heat;Iontophoresis '4mg'$ /ml Dexamethasone;Therapeutic activities;Therapeutic exercise;Neuromuscular re-education;Manual techniques;Patient/family education;Dry needling;Taping;Spinal Manipulations;Functional mobility training    PT Next Visit Plan continue hip hinge with light weights, continue core strengthening and thoracic extension    PT Home Exercise Plan Access Code [email or web address]    Consulted and Agree with Plan of Care Patient             Patient will benefit from skilled therapeutic intervention in order to improve the following deficits and impairments:  Decreased range of motion, Pain, Decreased strength, Decreased activity tolerance, Impaired perceived functional ability, Improper body mechanics  Visit Diagnosis: Chronic left-sided low back pain with left-sided sciatica  Muscle weakness (generalized)  Muscle spasm of back  Pain in thoracic spine     Problem  List Patient Active Problem List   Diagnosis Date  Noted   Radiculopathy of lumbar region 09/26/2020   Strain of right tibialis anterior muscle 09/26/2020   Carpal tunnel syndrome 09/19/2019   Thoracic disc disorder 02/04/2018   Inflammatory heel pain 02/22/2016   Osteoarthritis, hand 02/15/2015   Elbow pain, chronic, right 01/03/2015   Cervical radiculopathy at C8 03/21/2014   Degenerative disc disease, cervical 08/18/2013   Degenerative arthritis of lumbar spine 08/18/2013   Achilles tendinitis 01/06/2012   HYPOTHYROIDISM 03/15/2009   Plantar fasciitis, right 03/15/2009   CAVUS DEFORMITY OF FOOT, ACQUIRED 03/15/2009   Everardo All PT, DPT  11/26/20 5:32 PM   [facility] [address] [place], [place], [address] Phone: [phone]   Fax:  [phone]  Name: Samantha [name] MRN: [id number] Date of Birth: 1960-10-03

## 2020-11-29 ENCOUNTER — Ambulatory Visit: Payer: Managed Care, Other (non HMO) | Admitting: Physical Therapy

## 2020-11-29 ENCOUNTER — Other Ambulatory Visit: Payer: Self-pay

## 2020-11-29 DIAGNOSIS — G8929 Other chronic pain: Secondary | ICD-10-CM

## 2020-11-29 DIAGNOSIS — M6281 Muscle weakness (generalized): Secondary | ICD-10-CM

## 2020-11-29 DIAGNOSIS — M546 Pain in thoracic spine: Secondary | ICD-10-CM

## 2020-11-29 DIAGNOSIS — M5442 Lumbago with sciatica, left side: Secondary | ICD-10-CM | POA: Diagnosis not present

## 2020-11-29 DIAGNOSIS — M62830 Muscle spasm of back: Secondary | ICD-10-CM

## 2020-11-29 NOTE — Therapy (Signed)
[facility] [address], [place], [place], [address] Phone: [phone]   Fax:  [phone]  Physical Therapy Treatment  Patient Details  Name: Samantha Samantha [name] MRN: [id number] Date of Birth: [birth year] Referring Provider (PT): Dr. Karlton [name]   Encounter Date: 11/29/2020   PT End of Session - 11/29/20 1544     Visit Number 4    Number of Visits 15    Date for PT Re-Evaluation 01/11/21    Authorization Type cigna 20 visit limit -had 5 visits at start of current POC    Authorization - Visit Number 5    Authorization - Number of Visits 15    PT Start Time [id number]    PT Stop Time 1525    PT Time Calculation (min) 36 min    Activity Tolerance Patient tolerated treatment well;No increased pain    Behavior During Therapy WFL for tasks assessed/performed             Past Medical History:  Diagnosis Date   Allergy    Depression    GERD (gastroesophageal reflux disease)    Right carpal tunnel syndrome 09/19/2019   Thyroid disease     Past Surgical History:  Procedure Laterality Date   TONSILLECTOMY      There were no vitals filed for this visit.   Subjective Assessment - 11/29/20 1540     Subjective Has more central back pain. Mild tingling at thigh.    Pertinent History on/off neck, thoracic pain with previous PT    Limitations House hold activities    How long can you sit comfortably? 1 hour    How long can you stand comfortably? 5-10 minutes    How long can you walk comfortably? 30 min shopping  will go all the way across    Diagnostic tests [facility] L3-L5 DDD  compromised nerve at L5    Patient Stated Goals painfree; learn how to pick up grandchildren 2 year and twin 26 year olds;   good posture; get back into alignment    Currently in Pain? Yes    Pain Score 3     Pain Location Back    Pain Orientation Mid    Pain Descriptors / Indicators Aching                               [facility]  Adult PT Treatment/Exercise - 11/29/20 0001       Therapeutic Activites    Therapeutic Activities ADL's    ADL's hip hinge with 5# kb x 10 repetitions down to stool      Lumbar Exercises: Standing   Other Standing Lumbar Exercises hip hinge with golf club/dowel x 15    Other Standing Lumbar Exercises resisted walking, 20#; x4 trips each direction      Lumbar Exercises: Seated   Sit to Stand 10 reps    Sit to Stand Limitations 5# kettlebell      Lumbar Exercises: Supine   Ab Set 10 reps;5 seconds    Bent Samantha [name] Raise 10 reps      Lumbar Exercises: Sidelying   Clam Right;Left;10 reps    Clam Limitations green loop      Lumbar Exercises: Prone   Other Prone Lumbar Exercises forearm/knees modified plank 10 sec hold x 5      Lumbar Exercises: Quadruped   Opposite Arm/Leg Raise Right arm/Left leg;Left arm/Right leg;10 reps    Opposite  Arm/Leg Raise Limitations bent over high table while maintaining neutral spine                      PT Short Term Goals - 11/29/20 1543       PT SHORT TERM GOAL #1   Title The patient will have knowledge of basic self care strategies and initial HEP to relieve pain    Time 4    Period Weeks    Status Achieved    Target Date 12/14/20      PT SHORT TERM GOAL #2   Title The patient will demonstrate good hip hinge technique with lifting and home ADLS    Baseline [id number] minutes    Time 4    Period Weeks    Status On-going    Target Date 12/14/20      PT SHORT TERM GOAL #3   Title The patient will report a 30% improvement in back pain    Time 4    Period Weeks    Status On-going      PT SHORT TERM GOAL #4   Title Lumbar ffexion 50 degrees, extension 15 degrees and symmetrical sidebend to 30 degrees needed for on/off the floor to play with grandchildren    Time 4    Period Weeks    Status On-going               PT Long Term Goals - 11/16/20 1221       PT LONG TERM GOAL #1   Title Patient will be independent with  advanced HEP for long term management of symptoms post D/C.    Time 8    Period Weeks    Status New    Target Date 01/11/21      PT LONG TERM GOAL #2   Title Patient will improve FOTO score to 69 or greater to indicate improved overall function.    Time 8    Period Weeks    Status New      PT LONG TERM GOAL #3   Title Patient will transition from standing to floor level and back to standing using good mechanics and without increased pain to more readily perform work duties.    Time 8    Period Weeks    Status New      PT LONG TERM GOAL #4   Title Left LE strength to grossly 4+/5 and trunk strength 5-/5 needed for lifting her grandchildren    Time 8    Period Weeks    Status New      PT LONG TERM GOAL #5   Title .Marland KitchenMarland Kitchen                   Plan - 11/29/20 1541     Clinical Impression Statement Patient requiring CGA x1 when performing resisted walking exercise due to impaired eccentric hip adduction/abduction indicating need for continued LE and glute strengthening. She continues to report increaesed fatigue when performing plank exercise though she exhibits improved awareness of neutral spine positioning. Would benefit from continued skilled intervention along current POC.    Personal Factors and Comorbidities Time since onset of injury/illness/exacerbation    Comorbidities thoracic disc disease    Examination-Activity Limitations Sleep;Lift;Caring for Others;Carry    Examination-Participation Restrictions Cleaning;Other;Yard Work    Rehab Potential Good    PT Frequency 2x / week    PT Duration 8 weeks    PT Treatment/Interventions ADLs/Self Care Home Management;Aquatic Therapy;Cryotherapy;Dealer  Stimulation;Ultrasound;Traction;Moist Heat;Iontophoresis '4mg'$ /ml Dexamethasone;Therapeutic activities;Therapeutic exercise;Neuromuscular re-education;Manual techniques;Patient/family education;Dry needling;Taping;Spinal Manipulations;Functional mobility training    PT Next Visit  Plan progress hip hinge and other functional activities slowly; continue core strengthening and extension activities    PT Home Exercise Plan Access Code [email or web address]    Consulted and Agree with Plan of Care Patient             Patient will benefit from skilled therapeutic intervention in order to improve the following deficits and impairments:  Decreased range of motion, Pain, Decreased strength, Decreased activity tolerance, Impaired perceived functional ability, Improper body mechanics  Visit Diagnosis: Chronic left-sided low back pain with left-sided sciatica  Muscle weakness (generalized)  Muscle spasm of back  Pain in thoracic spine  Acute left-sided low back pain with left-sided sciatica     Problem List Patient Active Problem List   Diagnosis Date Noted   Radiculopathy of lumbar region 09/26/2020   Strain of right tibialis anterior muscle 09/26/2020   Carpal tunnel syndrome 09/19/2019   Thoracic disc disorder 02/04/2018   Inflammatory heel pain 02/22/2016   Osteoarthritis, hand 02/15/2015   Elbow pain, chronic, right 01/03/2015   Cervical radiculopathy at C8 03/21/2014   Degenerative disc disease, cervical 08/18/2013   Degenerative arthritis of lumbar spine 08/18/2013   Achilles tendinitis 01/06/2012   HYPOTHYROIDISM 03/15/2009   Plantar fasciitis, right 03/15/2009   CAVUS DEFORMITY OF FOOT, ACQUIRED 03/15/2009     Everardo All PT, DPT 11/29/20 3:46 PM   [facility] [address] [place], [place], [address] Phone: [phone]   Fax:  [phone]  Name: Samantha Samantha [name] MRN: [id number] Date of Birth: 12/31/1960

## 2020-12-04 ENCOUNTER — Ambulatory Visit: Payer: Managed Care, Other (non HMO) | Admitting: Physical Therapy

## 2020-12-04 ENCOUNTER — Other Ambulatory Visit: Payer: Self-pay

## 2020-12-04 DIAGNOSIS — G8929 Other chronic pain: Secondary | ICD-10-CM

## 2020-12-04 DIAGNOSIS — M5442 Lumbago with sciatica, left side: Secondary | ICD-10-CM | POA: Diagnosis not present

## 2020-12-04 DIAGNOSIS — M6281 Muscle weakness (generalized): Secondary | ICD-10-CM

## 2020-12-04 DIAGNOSIS — M62830 Muscle spasm of back: Secondary | ICD-10-CM

## 2020-12-04 NOTE — Therapy (Signed)
[facility] [address] [place], [place], [address] Phone: [phone]   Fax:  [phone]  Physical Therapy Treatment  Patient Details  Name: Samantha [name] MRN: [id number] Date of Birth: Sep 07, 1960 Referring Provider (PT): Dr. Karlton Lemon   Encounter Date: 12/04/2020   PT End of Session - 12/04/20 1236     Visit Number 5    Number of Visits 15    Date for PT Re-Evaluation 01/11/21    Authorization Type cigna 20 visit limit -had 5 visits at start of current POC    Authorization - Visit Number 6    Authorization - Number of Visits 15    PT Start Time [id number]    PT Stop Time 1225    PT Time Calculation (min) 40 min    Activity Tolerance Patient tolerated treatment well;No increased pain             Past Medical History:  Diagnosis Date   Allergy    Depression    GERD (gastroesophageal reflux disease)    Right carpal tunnel syndrome 09/19/2019   Thyroid disease     Past Surgical History:  Procedure Laterality Date   TONSILLECTOMY      There were no vitals filed for this visit.   Subjective Assessment - 12/04/20 1146     Subjective No symptoms at the moment.  Walked my dog yesterday 55# and had some pain while sleeping.  Friday when I was here I had no pain.  Did yardwork on Saturday.    Currently in Pain? No/denies    Pain Score 0-No pain    Pain Location Back                               [facility] Adult PT Treatment/Exercise - 12/04/20 0001       Lumbar Exercises: Stretches   Single Knee to Chest Stretch Limitations after glute ex's    Piriformis Stretch Limitations quadruped stretch 20 sec 3x      Lumbar Exercises: Standing   Other Standing Lumbar Exercises hip hinge; dead lift with 10# and 15# 2x 10 each    Other Standing Lumbar Exercises lat pull downs standing staggered stance 30# 2x15      Lumbar Exercises: Supine   Other Supine Lumbar Exercises holding red ball  isometrically knee/UE with opp UE/LE movements 15x      Lumbar Exercises: Sidelying   Other Sidelying Lumbar Exercises sideplanks 5x each side      Knee/Hip Exercises: Standing   Other Standing Knee Exercises green band press out 25x    Other Standing Knee Exercises review of previous HEP                    PT Education - 12/04/20 1406     Education Details modified dead lifts; quadruped piriformis stretch    Person(s) Educated Patient    Methods Explanation;Demonstration;Handout    Comprehension Returned demonstration;Verbalized understanding              PT Short Term Goals - 11/29/20 1543       PT SHORT TERM GOAL #1   Title The patient will have knowledge of basic self care strategies and initial HEP to relieve pain    Time 4    Period Weeks    Status Achieved    Target Date 12/14/20      PT SHORT TERM GOAL #2  Title The patient will demonstrate good hip hinge technique with lifting and home ADLS    Baseline [id number] minutes    Time 4    Period Weeks    Status On-going    Target Date 12/14/20      PT SHORT TERM GOAL #3   Title The patient will report a 30% improvement in back pain    Time 4    Period Weeks    Status On-going      PT SHORT TERM GOAL #4   Title Lumbar ffexion 50 degrees, extension 15 degrees and symmetrical sidebend to 30 degrees needed for on/off the floor to play with grandchildren    Time 4    Period Weeks    Status On-going               PT Long Term Goals - 11/16/20 1221       PT LONG TERM GOAL #1   Title Patient will be independent with advanced HEP for long term management of symptoms post D/C.    Time 8    Period Weeks    Status New    Target Date 01/11/21      PT LONG TERM GOAL #2   Title Patient will improve FOTO score to 69 or greater to indicate improved overall function.    Time 8    Period Weeks    Status New      PT LONG TERM GOAL #3   Title Patient will transition from standing to floor level and  back to standing using good mechanics and without increased pain to more readily perform work duties.    Time 8    Period Weeks    Status New      PT LONG TERM GOAL #4   Title Left LE strength to grossly 4+/5 and trunk strength 5-/5 needed for lifting her grandchildren    Time 8    Period Weeks    Status New      PT LONG TERM GOAL #5   Title .Marland KitchenMarland Kitchen                   Plan - 12/04/20 1225     Clinical Impression Statement The patient reports some symptoms in buttock and tingling in thigh although on an intermittent basis now.  No symptoms at present.  She demonstrates good carryover with hip hinge and therefore progressed to dead lifting.  Cues given to encourage more knee flexion with full dead lifts.  Weakness in left glutes noted with increasing compensation noted with repetition.    Personal Factors and Comorbidities Time since onset of injury/illness/exacerbation    Comorbidities thoracic disc disease    Examination-Activity Limitations Sleep;Lift;Caring for Others;Carry    Rehab Potential Good    PT Frequency 2x / week    PT Duration 8 weeks    PT Treatment/Interventions ADLs/Self Care Home Management;Aquatic Therapy;Cryotherapy;Electrical Stimulation;Ultrasound;Traction;Moist Heat;Iontophoresis '4mg'$ /ml Dexamethasone;Therapeutic activities;Therapeutic exercise;Neuromuscular re-education;Manual techniques;Patient/family education;Dry needling;Taping;Spinal Manipulations;Functional mobility training    PT Next Visit Plan gluteal strengthening; core strengthening; dead lifting; THORACIC ROTATION AND EXTENSION (try foam roll, open books, prone arm lifts)  LOWER TRAP STRENGTHENING    PT Home Exercise Plan Access Code [email or web address]    Consulted and Agree with Plan of Care Patient             Patient will benefit from skilled therapeutic intervention in order to improve the following deficits and impairments:  Decreased range of motion, Pain, Decreased strength, Decreased  activity  tolerance, Impaired perceived functional ability, Improper body mechanics  Visit Diagnosis: Chronic left-sided low back pain with left-sided sciatica  Muscle weakness (generalized)  Muscle spasm of back     Problem List Patient Active Problem List   Diagnosis Date Noted   Radiculopathy of lumbar region 09/26/2020   Strain of right tibialis anterior muscle 09/26/2020   Carpal tunnel syndrome 09/19/2019   Thoracic disc disorder 02/04/2018   Inflammatory heel pain 02/22/2016   Osteoarthritis, hand 02/15/2015   Elbow pain, chronic, right 01/03/2015   Cervical radiculopathy at C8 03/21/2014   Degenerative disc disease, cervical 08/18/2013   Degenerative arthritis of lumbar spine 08/18/2013   Achilles tendinitis 01/06/2012   HYPOTHYROIDISM 03/15/2009   Plantar fasciitis, right 03/15/2009   CAVUS DEFORMITY OF FOOT, ACQUIRED 03/15/2009   Ruben Im, PT 12/04/20 2:12 PM Phone: [phone] Fax: [phone]  Alvera Singh 12/04/2020, 2:11 PM  [facility] [address], [place], [place], [address] Phone: [phone]   Fax:  [phone]  Name: Samantha [name] MRN: [id number] Date of Birth: 06-16-1960

## 2020-12-04 NOTE — Patient Instructions (Signed)
Access Code: [email or web address] URL: [email or web address] Date: 12/04/2020 Prepared by: Ruben Im  Exercises Supine Lower Trunk Rotation - 2 x daily - 7 x weekly - 1 sets - 3 reps - 10s hold Supine Piriformis Stretch with Leg Straight - 2 x daily - 7 x weekly - 1 sets - 2 reps - 15s hold Supine Piriformis Stretch - 1 x daily - 7 x weekly - 3 sets - 10 reps Supine 90/90 Abdominal Bracing - 1 x daily - 7 x weekly - 1 sets - 10 reps - 5s hold Clam - 1 x daily - 7 x weekly - 1 sets - 10 reps Plank on Knees - 1 x daily - 7 x weekly - 1 sets - 10 reps - 5 hold Standing Hip Hinge with Dowel - 1 x daily - 7 x weekly - 1 sets - 10 reps Seated Thoracic Lumbar Extension with Pectoralis Stretch - 1 x daily - 7 x weekly - 1 sets - 10 reps Half Deadlift with Kettlebell - 1 x daily - 7 x weekly - 1 sets - 10 reps Quadruped Piriformis Stretch - 1 x daily - 7 x weekly - 1 sets - 3 reps - 20 hold

## 2020-12-07 ENCOUNTER — Ambulatory Visit: Payer: Managed Care, Other (non HMO) | Admitting: Physical Therapy

## 2020-12-11 ENCOUNTER — Other Ambulatory Visit: Payer: Self-pay

## 2020-12-11 ENCOUNTER — Ambulatory Visit: Payer: Managed Care, Other (non HMO) | Admitting: Physical Therapy

## 2020-12-11 DIAGNOSIS — M6281 Muscle weakness (generalized): Secondary | ICD-10-CM

## 2020-12-11 DIAGNOSIS — M62830 Muscle spasm of back: Secondary | ICD-10-CM

## 2020-12-11 DIAGNOSIS — M5442 Lumbago with sciatica, left side: Secondary | ICD-10-CM | POA: Diagnosis not present

## 2020-12-11 DIAGNOSIS — G8929 Other chronic pain: Secondary | ICD-10-CM

## 2020-12-11 NOTE — Therapy (Signed)
[facility] [address] [place], [place], [address] Phone: [phone]   Fax:  [phone]  Physical Therapy Treatment  Patient Details  Name: Samantha [name] MRN: [id number] Date of Birth: 14-Mar-1961 Referring Provider (PT): Dr. Karlton Lemon   Encounter Date: 12/11/2020   PT End of Session - 12/11/20 1858     Visit Number 6    Number of Visits 15    Date for PT Re-Evaluation 01/11/21    Authorization Type cigna 20 visit limit -had 5 visits at start of current POC    Authorization - Visit Number 6    Authorization - Number of Visits 15    PT Start Time [id number]    PT Stop Time 1528    PT Time Calculation (min) 40 min    Activity Tolerance Patient tolerated treatment well             Past Medical History:  Diagnosis Date   Allergy    Depression    GERD (gastroesophageal reflux disease)    Right carpal tunnel syndrome 09/19/2019   Thyroid disease     Past Surgical History:  Procedure Laterality Date   TONSILLECTOMY      There were no vitals filed for this visit.   Subjective Assessment - 12/11/20 1449     Subjective I hurt from mid back to low back pain.  I think from pushing baby stroller and then having the stomach bug may have aggravated everything.  Leg tingling left thigh to ankle.    Currently in Pain? Yes    Pain Score 3     Pain Location Back    Pain Orientation Right;Left    Pain Type Chronic pain                               [facility] Adult PT Treatment/Exercise - 12/11/20 0001       Lumbar Exercises: Stretches   Other Lumbar Stretch Exercise open books 10x right/left    Other Lumbar Stretch Exercise cat cow to childs pose      Lumbar Exercises: Aerobic   UBE (Upper Arm Bike) 3 min alternating directions      Lumbar Exercises: Supine   Bent Knee Raise 10 reps    Bent Knee Raise Limitations from foot stool    Other Supine Lumbar Exercises green band: overheads,  horizontal abduction double and single; diagonals, external rotation double 5x each      Lumbar Exercises: Prone   Other Prone Lumbar Exercises over 1 pillow: multifid press down, HS curls, hip extension 5x each leg    Other Prone Lumbar Exercises UE extension with head lift 5x; head lift with bil T 5x; head lift with UE W 5x                      PT Short Term Goals - 11/29/20 1543       PT SHORT TERM GOAL #1   Title The patient will have knowledge of basic self care strategies and initial HEP to relieve pain    Time 4    Period Weeks    Status Achieved    Target Date 12/14/20      PT SHORT TERM GOAL #2   Title The patient will demonstrate good hip hinge technique with lifting and home ADLS    Baseline [id number] minutes    Time 4  Period Weeks    Status On-going    Target Date 12/14/20      PT SHORT TERM GOAL #3   Title The patient will report a 30% improvement in back pain    Time 4    Period Weeks    Status On-going      PT SHORT TERM GOAL #4   Title Lumbar ffexion 50 degrees, extension 15 degrees and symmetrical sidebend to 30 degrees needed for on/off the floor to play with grandchildren    Time 4    Period Weeks    Status On-going               PT Long Term Goals - 11/16/20 1221       PT LONG TERM GOAL #1   Title Patient will be independent with advanced HEP for long term management of symptoms post D/C.    Time 8    Period Weeks    Status New    Target Date 01/11/21      PT LONG TERM GOAL #2   Title Patient will improve FOTO score to 69 or greater to indicate improved overall function.    Time 8    Period Weeks    Status New      PT LONG TERM GOAL #3   Title Patient will transition from standing to floor level and back to standing using good mechanics and without increased pain to more readily perform work duties.    Time 8    Period Weeks    Status New      PT LONG TERM GOAL #4   Title Left LE strength to grossly 4+/5 and trunk  strength 5-/5 needed for lifting her grandchildren    Time 8    Period Weeks    Status New      PT LONG TERM GOAL #5   Title .Marland KitchenMarland Kitchen                   Plan - 12/11/20 1859     Clinical Impression Statement The patient had a set back last week with exacerbation of mid to low back pain as well as the return of more constant left thigh tingling sensation.  Treatment modified based on symptoms but she is able to continue with thoraco-lumbar strengthening in supine and prone positions without symptom increase.  Good response to quadruped rocking for lumbar mobility.  Therapist closely monitoring response throughout treatment session.    Rehab Potential Good    PT Frequency 2x / week    PT Duration 8 weeks    PT Treatment/Interventions ADLs/Self Care Home Management;Aquatic Therapy;Cryotherapy;Electrical Stimulation;Ultrasound;Traction;Moist Heat;Iontophoresis '4mg'$ /ml Dexamethasone;Therapeutic activities;Therapeutic exercise;Neuromuscular re-education;Manual techniques;Patient/family education;Dry needling;Taping;Spinal Manipulations;Functional mobility training    PT Next Visit Plan check STGS next visit; gluteal strengthening; core strengthening; return to dead lifting; THORACIC ROTATION AND EXTENSION (try foam roll, open books, prone arm lifts)  LOWER TRAP STRENGTHENING    PT Home Exercise Plan Access Code [email or web address]             Patient will benefit from skilled therapeutic intervention in order to improve the following deficits and impairments:  Decreased range of motion, Pain, Decreased strength, Decreased activity tolerance, Impaired perceived functional ability, Improper body mechanics  Visit Diagnosis: Chronic left-sided low back pain with left-sided sciatica  Muscle weakness (generalized)  Muscle spasm of back     Problem List Patient Active Problem List   Diagnosis Date Noted   Radiculopathy of lumbar region 09/26/2020  Strain of right tibialis anterior muscle  09/26/2020   Carpal tunnel syndrome 09/19/2019   Thoracic disc disorder 02/04/2018   Inflammatory heel pain 02/22/2016   Osteoarthritis, hand 02/15/2015   Elbow pain, chronic, right 01/03/2015   Cervical radiculopathy at C8 03/21/2014   Degenerative disc disease, cervical 08/18/2013   Degenerative arthritis of lumbar spine 08/18/2013   Achilles tendinitis 01/06/2012   HYPOTHYROIDISM 03/15/2009   Plantar fasciitis, right 03/15/2009   CAVUS DEFORMITY OF FOOT, ACQUIRED 03/15/2009   Ruben Im, PT 12/11/20 7:19 PM Phone: [phone] Fax: [phone]  Alvera Singh 12/11/2020, 7:19 PM  [facility] [address] [place], [place], [address] Phone: [phone]   Fax:  [phone]  Name: Samantha [name] MRN: [id number] Date of Birth: Dec 10, 1960

## 2020-12-20 ENCOUNTER — Ambulatory Visit (INDEPENDENT_AMBULATORY_CARE_PROVIDER_SITE_OTHER): Payer: Managed Care, Other (non HMO)

## 2020-12-20 ENCOUNTER — Other Ambulatory Visit: Payer: Self-pay

## 2020-12-20 ENCOUNTER — Ambulatory Visit: Payer: Managed Care, Other (non HMO) | Attending: Family Medicine | Admitting: Physical Therapy

## 2020-12-20 DIAGNOSIS — J309 Allergic rhinitis, unspecified: Secondary | ICD-10-CM

## 2020-12-20 DIAGNOSIS — M6281 Muscle weakness (generalized): Secondary | ICD-10-CM | POA: Insufficient documentation

## 2020-12-20 DIAGNOSIS — M62830 Muscle spasm of back: Secondary | ICD-10-CM | POA: Insufficient documentation

## 2020-12-20 DIAGNOSIS — M5442 Lumbago with sciatica, left side: Secondary | ICD-10-CM | POA: Insufficient documentation

## 2020-12-20 DIAGNOSIS — G8929 Other chronic pain: Secondary | ICD-10-CM | POA: Diagnosis present

## 2020-12-20 NOTE — Therapy (Signed)
[facility] [address], [place], [place], [address] Phone: [phone]   Fax:  [phone]  Physical Therapy Treatment  Patient Details  Name: Samantha [name] MRN: [id number] Date of Birth: 07-08-1960 Referring Provider (PT): Dr. Karlton Lemon   Encounter Date: 12/20/2020   PT End of Session - 12/20/20 0921     Visit Number 7    Number of Visits 15    Date for PT Re-Evaluation 01/11/21    Authorization Type cigna 20 visit limit -had 5 visits at start of current POC    Authorization - Visit Number 7    Authorization - Number of Visits 15    PT Start Time [id number]    PT Stop Time 0929    PT Time Calculation (min) 42 min    Activity Tolerance Patient tolerated treatment well             Past Medical History:  Diagnosis Date   Allergy    Depression    GERD (gastroesophageal reflux disease)    Right carpal tunnel syndrome 09/19/2019   Thyroid disease     Past Surgical History:  Procedure Laterality Date   TONSILLECTOMY      There were no vitals filed for this visit.   Subjective Assessment - 12/20/20 0848     Subjective I'm better than I was last time.  Left buttock pain and tingly in left leg but it comes and goes.  Picking up grandchildren produces the tingling.  I feel it in the buttock too with lifting.  Goes away in a few minutes.  Buttock feels a little aggravated.    Pertinent History on/off neck, thoracic pain with previous PT    Currently in Pain? Yes    Pain Score 2     Pain Location Buttocks                               [facility] Adult PT Treatment/Exercise - 12/20/20 0001       Lumbar Exercises: Seated   Other Seated Lumbar Exercises review of HEP and discussion of response      Lumbar Exercises: Prone   Other Prone Lumbar Exercises over 1 pillow: multifid press down, HS curls, hip extension 5x each leg    Other Prone Lumbar Exercises UE extension with head lift 5x; head lift  with bil T 5x; head lift with UE W 5x      Electrical Stimulation   Electrical Stimulation Location lumbar left    Electrical Stimulation Action with Dn    Electrical Stimulation Parameters 1.5 ma 8 min    Electrical Stimulation Goals Pain      Manual Therapy   Soft tissue mobilization left glutes, piriformis; left paraspinals              Trigger Point Dry Needling - 12/20/20 0001     Consent Given? Yes    Education Handout Provided Previously provided    Muscles Treated Back/Hip Gluteus minimus;Piriformis;Erector spinae;Lumbar multifidi    Electrical Stimulation Performed with Dry Needling Yes    Other Dry Needling Lt only    Gluteus Minimus Response Palpable increased muscle length    Piriformis Response Palpable increased muscle length    Erector spinae Response Palpable increased muscle length    Lumbar multifidi Response Palpable increased muscle length  PT Short Term Goals - 12/20/20 1559       PT SHORT TERM GOAL #1   Title The patient will have knowledge of basic self care strategies and initial HEP to relieve pain    Status Achieved      PT SHORT TERM GOAL #2   Title The patient will demonstrate good hip hinge technique with lifting and home ADLS    Status Achieved      PT SHORT TERM GOAL #3   Title The patient will report a 30% improvement in back pain    Time 4    Period Weeks    Status On-going      PT SHORT TERM GOAL #4   Title Lumbar ffexion 50 degrees, extension 15 degrees and symmetrical sidebend to 30 degrees needed for on/off the floor to play with grandchildren    Time 4    Period Weeks    Status On-going               PT Long Term Goals - 11/16/20 1221       PT LONG TERM GOAL #1   Title Patient will be independent with advanced HEP for long term management of symptoms post D/C.    Time 8    Period Weeks    Status New    Target Date 01/11/21      PT LONG TERM GOAL #2   Title Patient will improve  FOTO score to 69 or greater to indicate improved overall function.    Time 8    Period Weeks    Status New      PT LONG TERM GOAL #3   Title Patient will transition from standing to floor level and back to standing using good mechanics and without increased pain to more readily perform work duties.    Time 8    Period Weeks    Status New      PT LONG TERM GOAL #4   Title Left LE strength to grossly 4+/5 and trunk strength 5-/5 needed for lifting her grandchildren    Time 8    Period Weeks    Status New      PT LONG TERM GOAL #5   Title .Marland KitchenMarland Kitchen                   Plan - 12/20/20 1551     Clinical Impression Statement The patient reports pain is less intense than last visit but peripheral symptoms still present.  LE paresthesia produced with bending/lifting her grandchildren and also produced with contraction of gluteals.  DN and manual therapy performed to gluteals and piriformis as well as DN and ES to L3-S1.  Immediately following treatment she reports less discomfort and tightness to the area.  As symptoms subside, will resume body mechanics/lifting.    Personal Factors and Comorbidities Time since onset of injury/illness/exacerbation    Comorbidities thoracic disc disease    Examination-Activity Limitations Sleep;Lift;Caring for Others;Carry    Rehab Potential Good    PT Frequency 2x / week    PT Duration 8 weeks    PT Treatment/Interventions ADLs/Self Care Home Management;Aquatic Therapy;Cryotherapy;Electrical Stimulation;Ultrasound;Traction;Moist Heat;Iontophoresis '4mg'$ /ml Dexamethasone;Therapeutic activities;Therapeutic exercise;Neuromuscular re-education;Manual techniques;Patient/family education;Dry needling;Taping;Spinal Manipulations;Functional mobility training    PT Next Visit Plan assess response to DN and DN/ES to lumbar region; lifting; thoracic/scapular strengthening;  quadruped;  recheck lumbar ROM for STGs    PT Home Exercise Plan Access Code [email or web address]  Patient will benefit from skilled therapeutic intervention in order to improve the following deficits and impairments:  Decreased range of motion, Pain, Decreased strength, Decreased activity tolerance, Impaired perceived functional ability, Improper body mechanics  Visit Diagnosis: Chronic left-sided low back pain with left-sided sciatica  Muscle weakness (generalized)  Muscle spasm of back     Problem List Patient Active Problem List   Diagnosis Date Noted   Radiculopathy of lumbar region 09/26/2020   Strain of right tibialis anterior muscle 09/26/2020   Carpal tunnel syndrome 09/19/2019   Thoracic disc disorder 02/04/2018   Inflammatory heel pain 02/22/2016   Osteoarthritis, hand 02/15/2015   Elbow pain, chronic, right 01/03/2015   Cervical radiculopathy at C8 03/21/2014   Degenerative disc disease, cervical 08/18/2013   Degenerative arthritis of lumbar spine 08/18/2013   Achilles tendinitis 01/06/2012   HYPOTHYROIDISM 03/15/2009   Plantar fasciitis, right 03/15/2009   CAVUS DEFORMITY OF FOOT, ACQUIRED 03/15/2009   Ruben Im, PT 12/20/20 4:01 PM Phone: [phone] Fax: [phone]  Alvera Singh, PT 12/20/2020, 4:00 PM  [facility] [address], [place], [place], [address] Phone: [phone]   Fax:  [phone]  Name: Samantha [name] MRN: [id number] Date of Birth: 20-Sep-1960

## 2020-12-27 ENCOUNTER — Other Ambulatory Visit: Payer: Self-pay

## 2020-12-27 ENCOUNTER — Ambulatory Visit: Payer: Managed Care, Other (non HMO) | Admitting: Physical Therapy

## 2020-12-27 DIAGNOSIS — G8929 Other chronic pain: Secondary | ICD-10-CM

## 2020-12-27 DIAGNOSIS — M5442 Lumbago with sciatica, left side: Secondary | ICD-10-CM | POA: Diagnosis not present

## 2020-12-27 DIAGNOSIS — M62830 Muscle spasm of back: Secondary | ICD-10-CM

## 2020-12-27 DIAGNOSIS — M6281 Muscle weakness (generalized): Secondary | ICD-10-CM

## 2020-12-27 NOTE — Therapy (Signed)
[facility] [address], [place], [place], [address] Phone: [phone]   Fax:  [phone]  Physical Therapy Treatment  Patient Details  Name: Samantha [name] MRN: [id number] Date of Birth: 1960/05/22 Referring Provider (PT): Dr. Karlton Lemon   Encounter Date: 12/27/2020   PT End of Session - 12/27/20 1037     Visit Number 8    Number of Visits 15    Date for PT Re-Evaluation 01/11/21    Authorization Type cigna 20 visit limit -had 5 visits at start of current POC    Authorization - Visit Number 8    Authorization - Number of Visits 15    PT Start Time 0848    PT Stop Time 0929    PT Time Calculation (min) 41 min    Activity Tolerance Patient tolerated treatment well             Past Medical History:  Diagnosis Date   Allergy    Depression    GERD (gastroesophageal reflux disease)    Right carpal tunnel syndrome 09/19/2019   Thyroid disease     Past Surgical History:  Procedure Laterality Date   TONSILLECTOMY      There were no vitals filed for this visit.   Subjective Assessment - 12/27/20 0849     Subjective Dn helped LBP.  Still tingling to the thigh.  There intermittently.  Produced with picking things up.    Currently in Pain? Yes    Pain Score 1     Pain Location Back                               [facility] Adult PT Treatment/Exercise - 12/27/20 0001       Lumbar Exercises: Stretches   Other Lumbar Stretch Exercise quadruped rocking 6x      Lumbar Exercises: Machines for Strengthening   Leg Press single leg 40# 15x each side      Lumbar Exercises: Standing   Side Lunge Limitations floor slider extension and half circles 10x each to right and left sides    Other Standing Lumbar Exercises wide stance squats 2 sets of 5   no LE symptoms   Other Standing Lumbar Exercises Pallof series kickstand position 5 sets of 5 on each leg      Lumbar Exercises: Quadruped    Madcat/Old Horse 5 reps    Opposite Arm/Leg Raise Limitations on forearms knee extension 7x right/left    Other Quadruped Lumbar Exercises quadruped plank/knee lift 5 sec 6x                     PT Education - 12/27/20 1036     Education Details floor slider hip strengtheners; squats    Person(s) Educated Patient    Methods Explanation;Demonstration;Handout    Comprehension Returned demonstration;Verbalized understanding              PT Short Term Goals - 12/20/20 1559       PT SHORT TERM GOAL #1   Title The patient will have knowledge of basic self care strategies and initial HEP to relieve pain    Status Achieved      PT SHORT TERM GOAL #2   Title The patient will demonstrate good hip hinge technique with lifting and home ADLS    Status Achieved      PT SHORT TERM GOAL #3   Title The  patient will report a 30% improvement in back pain    Time 4    Period Weeks    Status On-going      PT SHORT TERM GOAL #4   Title Lumbar ffexion 50 degrees, extension 15 degrees and symmetrical sidebend to 30 degrees needed for on/off the floor to play with grandchildren    Time 4    Period Weeks    Status On-going               PT Long Term Goals - 11/16/20 1221       PT LONG TERM GOAL #1   Title Patient will be independent with advanced HEP for long term management of symptoms post D/C.    Time 8    Period Weeks    Status New    Target Date 01/11/21      PT LONG TERM GOAL #2   Title Patient will improve FOTO score to 69 or greater to indicate improved overall function.    Time 8    Period Weeks    Status New      PT LONG TERM GOAL #3   Title Patient will transition from standing to floor level and back to standing using good mechanics and without increased pain to more readily perform work duties.    Time 8    Period Weeks    Status New      PT LONG TERM GOAL #4   Title Left LE strength to grossly 4+/5 and trunk strength 5-/5 needed for lifting her  grandchildren    Time 8    Period Weeks    Status New      PT LONG TERM GOAL #5   Title .Marland KitchenMarland Kitchen                   Plan - 12/27/20 1037     Clinical Impression Statement The patient reports good relief of LBP following DN last session.  Improved left peripheral symptoms as well, now above knee and intermittent vs constant.  She reports LE paresthesia will increase if she bends/lifts quickly without thinking.  We discussed avoiding neural tensioning as much as possible and strategies to avoid it with lifting (head up, more knee bend).  She is able to return to more core and LE strengthening today without exacerbation and reports she "feels better" at the conclusion of treatment session.    Examination-Activity Limitations Sleep;Lift;Caring for Others;Carry    Rehab Potential Good    PT Frequency 2x / week    PT Duration 8 weeks    PT Treatment/Interventions ADLs/Self Care Home Management;Aquatic Therapy;Cryotherapy;Electrical Stimulation;Ultrasound;Traction;Moist Heat;Iontophoresis '4mg'$ /ml Dexamethasone;Therapeutic activities;Therapeutic exercise;Neuromuscular re-education;Manual techniques;Patient/family education;Dry needling;Taping;Spinal Manipulations;Functional mobility training    PT Next Visit Plan As needed DN and DN/ES to lumbar region; lifting; thoracic/scapular strengthening;  quadruped;  recheck lumbar ROM for STGs; left glute strengthening; single leg press    PT Home Exercise Plan Access Code [email or web address]             Patient will benefit from skilled therapeutic intervention in order to improve the following deficits and impairments:  Decreased range of motion, Pain, Decreased strength, Decreased activity tolerance, Impaired perceived functional ability, Improper body mechanics  Visit Diagnosis: Chronic left-sided low back pain with left-sided sciatica  Muscle weakness (generalized)  Muscle spasm of back     Problem List Patient Active Problem List   Diagnosis  Date Noted   Radiculopathy of lumbar region 09/26/2020   Strain of  right tibialis anterior muscle 09/26/2020   Carpal tunnel syndrome 09/19/2019   Thoracic disc disorder 02/04/2018   Inflammatory heel pain 02/22/2016   Osteoarthritis, hand 02/15/2015   Elbow pain, chronic, right 01/03/2015   Cervical radiculopathy at C8 03/21/2014   Degenerative disc disease, cervical 08/18/2013   Degenerative arthritis of lumbar spine 08/18/2013   Achilles tendinitis 01/06/2012   HYPOTHYROIDISM 03/15/2009   Plantar fasciitis, right 03/15/2009   CAVUS DEFORMITY OF FOOT, ACQUIRED 03/15/2009   Ruben Im, PT 12/27/20 10:43 AM Phone: [phone] Fax: [phone]  Alvera Singh, PT 12/27/2020, 10:43 AM  [facility] [address] [place], [place], [address] Phone: [phone]   Fax:  [phone]  Name: Samantha [name] MRN: [id number] Date of Birth: 1961/02/10

## 2020-12-27 NOTE — Patient Instructions (Signed)
Access Code: [email or web address] URL: [email or web address] Date: 12/27/2020 Prepared by: Ruben Im  Exercises Supine Lower Trunk Rotation - 2 x daily - 7 x weekly - 1 sets - 3 reps - 10s hold Supine Piriformis Stretch with Leg Straight - 2 x daily - 7 x weekly - 1 sets - 2 reps - 15s hold Supine Piriformis Stretch - 1 x daily - 7 x weekly - 3 sets - 10 reps Supine 90/90 Abdominal Bracing - 1 x daily - 7 x weekly - 1 sets - 10 reps - 5s hold Clam - 1 x daily - 7 x weekly - 1 sets - 10 reps Plank on Knees - 1 x daily - 7 x weekly - 1 sets - 10 reps - 5 hold Standing Hip Hinge with Dowel - 1 x daily - 7 x weekly - 1 sets - 10 reps Seated Thoracic Lumbar Extension with Pectoralis Stretch - 1 x daily - 7 x weekly - 1 sets - 10 reps Half Deadlift with Kettlebell - 1 x daily - 7 x weekly - 1 sets - 10 reps Quadruped Piriformis Stretch - 1 x daily - 7 x weekly - 1 sets - 3 reps - 20 hold Sidelying Open Book Thoracic Rotation with Knee on Foam Roll - 1 x daily - 7 x weekly - 1 sets - 10 reps Quadruped Rocking Slow - 1 x daily - 7 x weekly - 1 sets - 10 reps Supine Shoulder Horizontal Abduction with Resistance - 1 x daily - 7 x weekly - 1 sets - 10 reps Supine Shoulder External Rotation with Resistance - 1 x daily - 7 x weekly - 1 sets - 10 reps Prone Hip Extension - One Pillow - 1 x daily - 7 x weekly - 1 sets - 10 reps Prone Hip Extension with Pillow Under Abdomen - 1 x daily - 7 x weekly - 1 sets - 10 reps Prone Scapular Slide with Shoulder Extension - 1 x daily - 7 x weekly - 1 sets - 10 reps Prone Scapular Retraction Arms at Side - 1 x daily - 7 x weekly - 1 sets - 10 reps Prone W Scapular Retraction - 1 x daily - 7 x weekly - 1 sets - 10 reps Squat with Chair Touch and Resistance Loop - 1 x daily - 7 x weekly - 1 sets - 10 reps Reverse Lunge on Slider - 1 x daily - 7 x weekly - 1 sets - 10 reps 3-Way Lunge on Slider - 1 x daily - 7 x weekly - 1 sets - 10 reps

## 2021-01-04 ENCOUNTER — Ambulatory Visit: Payer: Managed Care, Other (non HMO) | Admitting: Physical Therapy

## 2021-01-04 ENCOUNTER — Other Ambulatory Visit: Payer: Self-pay

## 2021-01-04 DIAGNOSIS — M5442 Lumbago with sciatica, left side: Secondary | ICD-10-CM | POA: Diagnosis not present

## 2021-01-04 DIAGNOSIS — G8929 Other chronic pain: Secondary | ICD-10-CM

## 2021-01-04 DIAGNOSIS — M62830 Muscle spasm of back: Secondary | ICD-10-CM

## 2021-01-04 DIAGNOSIS — M6281 Muscle weakness (generalized): Secondary | ICD-10-CM

## 2021-01-04 NOTE — Therapy (Signed)
[facility] [address], [place], [place], [address] Phone: [phone]   Fax:  [phone]  Physical Therapy Treatment  Patient Details  Name: Samantha [name] MRN: [id number] Date of Birth: 08-07-1960 Referring Provider (PT): Dr. Karlton Lemon   Encounter Date: 01/04/2021   PT End of Session - 01/04/21 1243     Visit Number 9    Number of Visits 15    Date for PT Re-Evaluation 01/11/21    Authorization Type cigna 20 visit limit -had 5 visits at start of current POC    Authorization - Visit Number 9    Authorization - Number of Visits 15    PT Start Time 4034    PT Stop Time 1100    PT Time Calculation (min) 45 min    Activity Tolerance Patient tolerated treatment well             Past Medical History:  Diagnosis Date   Allergy    Depression    GERD (gastroesophageal reflux disease)    Right carpal tunnel syndrome 09/19/2019   Thyroid disease     Past Surgical History:  Procedure Laterality Date   TONSILLECTOMY      There were no vitals filed for this visit.   Subjective Assessment - 01/04/21 1020     Subjective About the same.  A little thigh tingling coming and going and produced with sit to stand but could have been from looking down while doing it.  Carrying twins both at once bothers.    Pertinent History on/off neck, thoracic pain with previous PT    Patient Stated Goals painfree; learn how to pick up grandchildren 2 year and twin 72 year olds;   good posture; get back into alignment    Currently in Pain? Yes    Pain Score 3     Pain Location Back    Pain Type Chronic pain                               [facility] Adult PT Treatment/Exercise - 01/04/21 0001       Lumbar Exercises: Seated   Other Seated Lumbar Exercises sit to stand with head neutral      Lumbar Exercises: Supine   Other Supine Lumbar Exercises vertical and horizontal lying on foam roll; UE movements; thoracic  mobility      Electrical Stimulation   Electrical Stimulation Location lumbar left and left glutes    Electrical Stimulation Action with DN    Electrical Stimulation Parameters 1.5 8 min    Electrical Stimulation Goals Pain      Manual Therapy   Soft tissue mobilization left glutes, piriformis; left paraspinals              Trigger Point Dry Needling - 01/04/21 0001     Consent Given? Yes    Education Handout Provided Previously provided    Muscles Treated Head and Neck Levator scapulae    Muscles Treated Upper Quadrant Rhomboids    Muscles Treated Back/Hip Gluteus minimus;Piriformis;Erector spinae;Lumbar multifidi    Electrical Stimulation Performed with Dry Needling Yes    E-stim with Dry Needling Details multifidi and gluteals    Other Dry Needling Lt only    Levator Scapulae Response Twitch response elicited;Palpable increased muscle length    Rhomboids Response Palpable increased muscle length    Gluteus Minimus Response Palpable increased muscle length    Piriformis  Response Palpable increased muscle length    Erector spinae Response Palpable increased muscle length    Lumbar multifidi Response Palpable increased muscle length                   PT Education - 01/04/21 1243     Education Details foam roll info    Person(s) Educated Patient    Methods Handout    Comprehension Verbalized understanding              PT Short Term Goals - 12/20/20 1559       PT SHORT TERM GOAL #1   Title The patient will have knowledge of basic self care strategies and initial HEP to relieve pain    Status Achieved      PT SHORT TERM GOAL #2   Title The patient will demonstrate good hip hinge technique with lifting and home ADLS    Status Achieved      PT SHORT TERM GOAL #3   Title The patient will report a 30% improvement in back pain    Time 4    Period Weeks    Status On-going      PT SHORT TERM GOAL #4   Title Lumbar ffexion 50 degrees, extension 15  degrees and symmetrical sidebend to 30 degrees needed for on/off the floor to play with grandchildren    Time 4    Period Weeks    Status On-going               PT Long Term Goals - 11/16/20 1221       PT LONG TERM GOAL #1   Title Patient will be independent with advanced HEP for long term management of symptoms post D/C.    Time 8    Period Weeks    Status New    Target Date 01/11/21      PT LONG TERM GOAL #2   Title Patient will improve FOTO score to 69 or greater to indicate improved overall function.    Time 8    Period Weeks    Status New      PT LONG TERM GOAL #3   Title Patient will transition from standing to floor level and back to standing using good mechanics and without increased pain to more readily perform work duties.    Time 8    Period Weeks    Status New      PT LONG TERM GOAL #4   Title Left LE strength to grossly 4+/5 and trunk strength 5-/5 needed for lifting her grandchildren    Time 8    Period Weeks    Status New      PT LONG TERM GOAL #5   Title .Marland KitchenMarland Kitchen                   Plan - 01/04/21 1243     Clinical Impression Statement Neural symptoms unchanged and easily produced with tensioning.  Had good relief with DN 2 weeks ago and added addition DN and ES to L5 S1 neural path today.  Discussed non pharmaceutical strategies to unload/decompress spine using soft foam roll.  Therapist monitoring response with all interventions.    Personal Factors and Comorbidities Time since onset of injury/illness/exacerbation    Comorbidities thoracic disc disease    Examination-Activity Limitations Sleep;Lift;Caring for Others;Carry    Rehab Potential Good    PT Frequency 2x / week    PT Duration 8 weeks  PT Treatment/Interventions ADLs/Self Care Home Management;Aquatic Therapy;Cryotherapy;Electrical Stimulation;Ultrasound;Traction;Moist Heat;Iontophoresis 4mg /ml Dexamethasone;Therapeutic activities;Therapeutic exercise;Neuromuscular  re-education;Manual techniques;Patient/family education;Dry needling;Taping;Spinal Manipulations;Functional mobility training    PT Next Visit Plan ERO next visit;  As needed DN and DN/ES to lumbar region; lifting; thoracic/scapular strengthening;  quadruped;  recheck lumbar ROM for STGs; left glute strengthening; single leg press    PT Home Exercise Plan Access Code [email or web address]             Patient will benefit from skilled therapeutic intervention in order to improve the following deficits and impairments:  Decreased range of motion, Pain, Decreased strength, Decreased activity tolerance, Impaired perceived functional ability, Improper body mechanics  Visit Diagnosis: Chronic left-sided low back pain with left-sided sciatica  Muscle weakness (generalized)  Muscle spasm of back     Problem List Patient Active Problem List   Diagnosis Date Noted   Radiculopathy of lumbar region 09/26/2020   Strain of right tibialis anterior muscle 09/26/2020   Carpal tunnel syndrome 09/19/2019   Thoracic disc disorder 02/04/2018   Inflammatory heel pain 02/22/2016   Osteoarthritis, hand 02/15/2015   Elbow pain, chronic, right 01/03/2015   Cervical radiculopathy at C8 03/21/2014   Degenerative disc disease, cervical 08/18/2013   Degenerative arthritis of lumbar spine 08/18/2013   Achilles tendinitis 01/06/2012   HYPOTHYROIDISM 03/15/2009   Plantar fasciitis, right 03/15/2009   CAVUS DEFORMITY OF FOOT, ACQUIRED 03/15/2009   Ruben Im, PT 01/04/21 12:50 PM Phone: [phone] Fax: [phone]  Alvera Singh, PT 01/04/2021, 12:50 PM  [facility] [address], [place], [place], [address] Phone: [phone]   Fax:  [phone]  Name: Berda Shelvin MRN: [id number] Date of Birth: March 10, 1961

## 2021-01-10 ENCOUNTER — Encounter: Payer: Managed Care, Other (non HMO) | Admitting: Physical Therapy

## 2021-01-16 DIAGNOSIS — J301 Allergic rhinitis due to pollen: Secondary | ICD-10-CM | POA: Diagnosis not present

## 2021-01-16 NOTE — Progress Notes (Signed)
VIALS MADE. EXP 01-16-22 

## 2021-01-17 ENCOUNTER — Ambulatory Visit (INDEPENDENT_AMBULATORY_CARE_PROVIDER_SITE_OTHER): Payer: Managed Care, Other (non HMO) | Admitting: *Deleted

## 2021-01-17 DIAGNOSIS — J309 Allergic rhinitis, unspecified: Secondary | ICD-10-CM

## 2021-01-18 ENCOUNTER — Other Ambulatory Visit: Payer: Self-pay | Admitting: Family Medicine

## 2021-01-18 ENCOUNTER — Other Ambulatory Visit: Payer: Self-pay

## 2021-01-18 DIAGNOSIS — M5416 Radiculopathy, lumbar region: Secondary | ICD-10-CM

## 2021-01-18 NOTE — Telephone Encounter (Signed)
Ok to go ahead and place order for repeat injection.  Please let her know.  Thanks!

## 2021-01-24 ENCOUNTER — Encounter: Payer: Managed Care, Other (non HMO) | Admitting: Physical Therapy

## 2021-01-28 ENCOUNTER — Ambulatory Visit: Payer: Managed Care, Other (non HMO) | Admitting: Sports Medicine

## 2021-02-04 ENCOUNTER — Ambulatory Visit: Payer: Managed Care, Other (non HMO) | Admitting: Family Medicine

## 2021-02-04 VITALS — BP 118/84 | Ht 67.5 in | Wt 176.0 lb

## 2021-02-04 DIAGNOSIS — M5416 Radiculopathy, lumbar region: Secondary | ICD-10-CM

## 2021-02-04 NOTE — Patient Instructions (Signed)
Go ahead with the injection on Thursday as scheduled. Call me a week after this (or send me a mychart message) to let me know how you're doing).  You also have achilles tendinitis. Do home exercises, stretches as directed. Heat 15 minutes at a time 3-4 times a day. Heel lifts help unload the tension on the tendon during the day. Can consider physical therapy if not improving but this usually isn't necessary. Follow up with me in 6 weeks.

## 2021-02-05 ENCOUNTER — Encounter: Payer: Self-pay | Admitting: Family Medicine

## 2021-02-05 NOTE — Progress Notes (Signed)
PCP: Crist Infante, MD  Subjective:   HPI: Patient is a 60 y.o. female here for left hip/back pain.  Patient returns today to ensure pain she has in left leg/hip is due to radiculopathy that she's had previously and not due to different hip issue. Pain posterior left hip/buttock radiating down her left leg. Associated numbness. She started to get left heel pain as well starting mid September.  Pain in heel worse with full dorsiflexion of ankle. She is scheduled to get an epidural on Thursday.  Past Medical History:  Diagnosis Date   Allergy    Depression    GERD (gastroesophageal reflux disease)    Right carpal tunnel syndrome 09/19/2019   Thyroid disease     Current Outpatient Medications on File Prior to Visit  Medication Sig Dispense Refill   Calcium Carb-Cholecalciferol (CALCIUM-VITAMIN D3) 250-125 MG-UNIT TABS Take 1 tablet once a day     Cholecalciferol (VITAMIN D3) 1000 units CAPS Take 1,000 Units by mouth daily.     diclofenac Sodium (VOLTAREN) 1 % GEL Apply 2 g topically 4 (four) times daily. 100 g 3   EPINEPHrine 0.3 mg/0.3 mL IJ SOAJ injection Inject 0.3 mg into the muscle as needed for anaphylaxis. 2 each 1   hydrocortisone 2.5 % ointment   1   sertraline (ZOLOFT) 50 MG tablet Take 50 mg by mouth daily.     TIROSINT 100 MCG CAPS Take by mouth.     TIROSINT 88 MCG CAPS   10   No current facility-administered medications on file prior to visit.    Past Surgical History:  Procedure Laterality Date   TONSILLECTOMY      Allergies  Allergen Reactions   Sulfonamide Derivatives Nausea And Vomiting   Nortriptyline Other (See Comments)    depression   Omeprazole Hives   Singulair [Montelukast Sodium] Other (See Comments)    Depressed mood    BP 118/84   Ht 5' 7.5" (1.715 m)   Wt 176 lb (79.8 kg)   BMI 27.16 kg/m   [place] Adult Exercise 02/23/2020 08/06/2020 02/04/2021  Frequency of aerobic exercise (# of days/week) 0 2 1  Average time in  minutes 0 45 15  Frequency of strengthening activities (# of days/week) 0 0 0    No flowsheet data found.      Objective:  Physical Exam:  Gen: NAD, comfortable in exam room  Back: No gross deformity, scoliosis. No TTP.  No midline or bony TTP. FROM. Strength LEs 5/5 all muscle groups.   2+ MSRs in patellar and achilles tendons, equal bilaterally. Negative SLRs. Sensation intact to light touch bilaterally.  Left hip: No deformity. FROM with 5/5 strength. No tenderness to palpation. NVI distally. Negative logroll Negative faber, fadir, and piriformis stretches.  Left foot/ankle: No gross deformity, swelling, ecchymoses FROM TTP medial calcaneus, achilles insertion. Negative syndesmotic compression. Negative calcaneal squeeze. Thompsons test negative. NV intact distally.   Assessment & Plan:  1. Low back pain with radiation into left leg - exam is reassuring.  Consistent with left lumbar radiculopathy which was seen on MRI.  Go ahead with epidural and let us know a week after how she's doing.  2. Left achilles tendinopathy - insertional.  Home exercises and stretches reviewed.  Heat, heel lifts.  F/u in 6 weeks.  Consider formal PT if not improving.

## 2021-02-07 ENCOUNTER — Other Ambulatory Visit: Payer: Self-pay

## 2021-02-07 ENCOUNTER — Ambulatory Visit
Admission: RE | Admit: 2021-02-07 | Discharge: 2021-02-07 | Disposition: A | Discharge status: Home / Self Care | Payer: Managed Care, Other (non HMO) | Source: Ambulatory Visit | Attending: Family Medicine | Admitting: Family Medicine

## 2021-02-07 ENCOUNTER — Encounter: Payer: Managed Care, Other (non HMO) | Admitting: Physical Therapy

## 2021-02-07 DIAGNOSIS — M5416 Radiculopathy, lumbar region: Secondary | ICD-10-CM

## 2021-02-07 MED ORDER — METHYLPREDNISOLONE ACETATE 40 MG/ML INJ SUSP (RADIOLOG
80.0000 mg | Freq: Once | INTRAMUSCULAR | Status: AC
  Administered 2021-02-07: 80 mg via EPIDURAL

## 2021-02-07 MED ORDER — IOPAMIDOL (ISOVUE-M 200) INJECTION 41%
1.0000 mL | Freq: Once | INTRAMUSCULAR | Status: AC
  Administered 2021-02-07: 1 mL via EPIDURAL

## 2021-02-07 NOTE — Discharge Instructions (Signed)
Spinal Injection Discharge Instruction Sheet  You may resume a regular diet and any medications that you routinely take, including pain medications.  No driving the rest of the day of the procedure.  Light activity throughout the rest of the day.  Do not do any strenuous work, exercise, bending or lifting.  The day following the procedure, you may resume normal physical activity but you should refrain from exercising or physical therapy for at least three days.   Common Side Effects:  Headaches- take your usual medications as directed by your physician.    Restlessness or inability to sleep- you may have trouble sleeping for the next few days.  Ask your referring physician if you need any medication for sleep if over the counter sleep medications do not help.  Facial flushing or redness- this should subside within a few days.  Increased pain- a temporary increase in pain a day or two following your procedure is not unusual.  Take your pain medication as prescribed by your referring physician.  You may use ice to the injection site as needed.  Please do not use heat for 24 hours.  Leg cramps  Please contact our office at [phone] for the following symptoms: Fever greater than 100 degrees. Headaches unresolved with medication after 2-3 days. Increased swelling, pain, or redness at injection site.  Thank you for visiting our office.  

## 2021-02-13 ENCOUNTER — Other Ambulatory Visit: Payer: Managed Care, Other (non HMO)

## 2021-02-21 ENCOUNTER — Ambulatory Visit (INDEPENDENT_AMBULATORY_CARE_PROVIDER_SITE_OTHER): Payer: Managed Care, Other (non HMO)

## 2021-02-21 DIAGNOSIS — J309 Allergic rhinitis, unspecified: Secondary | ICD-10-CM | POA: Diagnosis not present

## 2021-03-18 ENCOUNTER — Ambulatory Visit: Payer: Managed Care, Other (non HMO) | Admitting: Family Medicine

## 2021-03-26 ENCOUNTER — Ambulatory Visit (INDEPENDENT_AMBULATORY_CARE_PROVIDER_SITE_OTHER): Payer: Managed Care, Other (non HMO) | Admitting: *Deleted

## 2021-03-26 DIAGNOSIS — J309 Allergic rhinitis, unspecified: Secondary | ICD-10-CM

## 2021-03-27 ENCOUNTER — Ambulatory Visit: Payer: Managed Care, Other (non HMO) | Admitting: Family Medicine

## 2021-04-01 ENCOUNTER — Ambulatory Visit: Payer: Managed Care, Other (non HMO) | Admitting: Family Medicine

## 2021-04-01 ENCOUNTER — Encounter: Payer: Self-pay | Admitting: Family Medicine

## 2021-04-01 VITALS — BP 122/80 | Ht 67.5 in | Wt 174.0 lb

## 2021-04-01 DIAGNOSIS — M5416 Radiculopathy, lumbar region: Secondary | ICD-10-CM | POA: Diagnosis not present

## 2021-04-01 NOTE — Progress Notes (Signed)
PCP: Crist Infante, MD  Subjective:   HPI: Patient is a 60 y.o. female here for left hip/back pain.  10/24: Patient returns today to ensure pain she has in left leg/hip is due to radiculopathy that she's had previously and not due to different hip issue. Pain posterior left hip/buttock radiating down her left leg. Associated numbness. She started to get left heel pain as well starting mid September.  Pain in heel worse with full dorsiflexion of ankle. She is scheduled to get an epidural on Thursday.  12/19: Patient reports she did very well for 3.5 weeks after last epidural injection (her second). She did home exercises as well. Unfortunately feels back to where she was before the second injection. Heel pain not as bad as the last visit though. Pain left leg/hip radiating down leg with associated numbness.  Past Medical History:  Diagnosis Date   Allergy    Depression    GERD (gastroesophageal reflux disease)    Right carpal tunnel syndrome 09/19/2019   Thyroid disease     Current Outpatient Medications on File Prior to Visit  Medication Sig Dispense Refill   Calcium Carb-Cholecalciferol (CALCIUM-VITAMIN D3) 250-125 MG-UNIT TABS Take 1 tablet once a day     Cholecalciferol (VITAMIN D3) 1000 units CAPS Take 1,000 Units by mouth daily.     diclofenac Sodium (VOLTAREN) 1 % GEL Apply 2 g topically 4 (four) times daily. 100 g 3   EPINEPHrine 0.3 mg/0.3 mL IJ SOAJ injection Inject 0.3 mg into the muscle as needed for anaphylaxis. 2 each 1   hydrocortisone 2.5 % ointment   1   sertraline (ZOLOFT) 50 MG tablet Take 50 mg by mouth daily.     TIROSINT 100 MCG CAPS Take by mouth.     TIROSINT 88 MCG CAPS   10   No current facility-administered medications on file prior to visit.    Past Surgical History:  Procedure Laterality Date   TONSILLECTOMY      Allergies  Allergen Reactions   Nortriptyline Other (See Comments)    depression   Omeprazole Hives   Singulair [Montelukast  Sodium] Other (See Comments)    Depressed mood   Sulfonamide Derivatives Nausea And Vomiting    BP 122/80    Ht 5' 7.5" (1.715 m)    Wt 174 lb (78.9 kg)    BMI 26.85 kg/m   [place] Adult Exercise 02/23/2020 08/06/2020 02/04/2021 04/01/2021  Frequency of aerobic exercise (# of days/week) 0 2 1 1   Average time in minutes 0 45 15 15  Frequency of strengthening activities (# of days/week) 0 0 0 0    No flowsheet data found.      Objective:  Physical Exam:  Gen: NAD, comfortable in exam room  Back: No gross deformity, scoliosis. No paraspinal TTP.  No midline or bony TTP. Strength LEs 5/5 all muscle groups.   2+ MSRs in patellar and achilles tendons, equal bilaterally. Negative SLRs. Sensation intact to light touch bilaterally.   Assessment & Plan:  1. Low back pain with radiation into left leg - 2/2 known left lumbar radiculopathy confirmed on MRI.  Has done 2 ESIs with transient improvement - set up for third injection and restart physical therapy at least a week after that injection.  Consider neurosurgery referral if she does not improve.

## 2021-04-01 NOTE — Patient Instructions (Signed)
We will go ahead with a third epidural injection. Wait at least a week before restarting physical therapy at [facility] - we will place an order for this. If things do not continue to go/stay in a positive direction the next step would be to see a neurosurgeon - you would just call me and we would place the referral for this.

## 2021-04-10 ENCOUNTER — Other Ambulatory Visit: Payer: Self-pay

## 2021-04-10 ENCOUNTER — Ambulatory Visit: Payer: Managed Care, Other (non HMO) | Attending: Internal Medicine

## 2021-04-10 DIAGNOSIS — M5416 Radiculopathy, lumbar region: Secondary | ICD-10-CM | POA: Diagnosis not present

## 2021-04-10 DIAGNOSIS — M5442 Lumbago with sciatica, left side: Secondary | ICD-10-CM | POA: Diagnosis present

## 2021-04-10 DIAGNOSIS — M6281 Muscle weakness (generalized): Secondary | ICD-10-CM | POA: Insufficient documentation

## 2021-04-10 DIAGNOSIS — M62830 Muscle spasm of back: Secondary | ICD-10-CM | POA: Diagnosis present

## 2021-04-10 DIAGNOSIS — G8929 Other chronic pain: Secondary | ICD-10-CM | POA: Diagnosis present

## 2021-04-10 NOTE — Patient Instructions (Signed)
Access Code: [email or web address] URL: [email or web address] Date: 04/10/2021 Prepared by: Candyce Churn  Exercises Lying Prone with 1 Pillow - 2 x daily - 7 x weekly - 1 sets - 1 reps - 1 min hold Static Prone on Elbows - 2 x daily - 7 x weekly - 1 sets - 1 reps - 1 min hold Prone Press Up - 2 x daily - 7 x weekly - 1 sets - 10 reps Supine Posterior Pelvic Tilt - 2 x daily - 7 x weekly - 2 sets - 10 reps Supine Dead Bug with Leg Extension - 2 x daily - 7 x weekly - 1 sets - 20 reps

## 2021-04-10 NOTE — Therapy (Signed)
[facility] @ [address] [place], [place], [address] Phone: [phone]   Fax:  [phone]  Physical Therapy Evaluation  Patient Details  Name: Samantha [name] MRN: [id number] Date of Birth: 08/28/1960 Referring Provider (PT): Karlton Lemon, MD   Encounter Date: 04/10/2021   PT End of Session - 04/10/21 0848     Visit Number 1    Date for PT Re-Evaluation 06/05/21    Authorization Type [employer]    PT Start Time 0805    PT Stop Time 0838    PT Time Calculation (min) 33 min    Activity Tolerance Patient tolerated treatment well    Behavior During Therapy [facility] for tasks assessed/performed             Past Medical History:  Diagnosis Date   Allergy    Depression    GERD (gastroesophageal reflux disease)    Right carpal tunnel syndrome 09/19/2019   Thyroid disease     Past Surgical History:  Procedure Laterality Date   TONSILLECTOMY      There were no vitals filed for this visit.    Subjective Assessment - 04/10/21 0809     Subjective Patient states she has experienced back pain at some degree for about 30 years.  She states she has had 2 epidural injections this past year with no relief.  MRI reveals L4-L5 nerve root impingement.  She states she is able to do most everything but she has pain if she over does it.  She states her left knee will sometimes become unstable.  She has a lot of trouble getting up and down from the floor.  She is retired and keeps her grandchildren who are twins 61 year old and another one who is 2.                [facility] PT Assessment - 04/10/21 0001       Assessment   Medical Diagnosis Lumbar radiculopathy    Referring Provider (PT) Karlton Lemon, MD    Onset Date/Surgical Date 04/14/18    Hand Dominance Right    Next MD Visit as needed    Prior Therapy yes      Precautions   Precautions None      Restrictions   Weight Bearing Restrictions No      Balance Screen   Has the  patient fallen in the past 6 months No      [place] residence    Living Arrangements Spouse/significant other    Type of [place] to enter    Entrance Stairs-Number of Steps 4    Entrance Stairs-Rails Can reach both    Home Layout Two level    [place] None      Prior Function   Level of [place] Retired    Leisure babysits 3 grandkids ages 75 and 2 and enjoys yardwork      Cognition   Overall Cognitive Status Within Functional Limits for tasks assessed      Observation/Other Assessments   Observations Pleasant 60 y.o. female in no acute distress      Sensation   Light Touch Impaired by gross assessment    Additional Comments decreased sensation along L4-L5 in left LE to light touch      Functional Tests   Functional tests Sit to Stand      Sit to Stand  Comments able to rise easily without use of hands      ROM / Strength   AROM / PROM / Strength AROM;Strength      AROM   AROM Assessment Site Lumbar    Lumbar Flexion fingertips to ankle    Lumbar Extension WNL    Lumbar - Right Side Bend fingertips to joint line    Lumbar - Left Side Bend fingertips to joint line    Lumbar - Right Rotation WNL    Lumbar - Left Rotation WNL      Strength   Overall Strength Deficits    Overall Strength Comments Left hip flexor, abd and hip IR's  weakness      Flexibility   Soft Tissue Assessment /Muscle Length yes    Hamstrings Hamstrings to approx 75-80 de    Quadriceps mild tightness noted on [facility] test                        Objective measurements completed on examination: See above findings.                PT Education - 04/10/21 0942     Education Details Access Code: [email or web address]    Person(s) Educated Patient    Methods Explanation;Demonstration;Verbal cues    Comprehension Verbalized understanding              PT Short Term Goals -  04/10/21 0934       PT SHORT TERM GOAL #1   Title Independent with initial HEP    Time 4    Period Weeks    Status New    Target Date 05/08/21      PT SHORT TERM GOAL #2   Title The patient will demonstrate good hip hinge technique with lifting grandchildren and home ADLS    Time 4    Period Weeks    Status New    Target Date 05/08/21               PT Long Term Goals - 04/10/21 0935       PT LONG TERM GOAL #1   Title Patient will be independent with advanced HEP for long term management of symptoms post D/C.    Time 8    Period Weeks    Status New    Target Date 06/05/21      PT LONG TERM GOAL #2   Title Patient will report centralization of radicular symptoms 90% of the time    Time 8    Period Weeks    Status New    Target Date 06/05/21      PT LONG TERM GOAL #3   Title Patient to be able to get up and down from floor without exacerbation of pain    Time 8    Period Weeks    Status New    Target Date 06/05/21      PT LONG TERM GOAL #4   Title Left LE strength to grossly 4+/5 and trunk strength 5-/5 needed for lifting her grandchildren    Time 8    Period Weeks    Status New    Target Date 06/05/21                    Plan - 04/10/21 0857     Clinical Impression Statement Patient is a 60 y.o. female with long history of low back pain, now having radicular symptoms.  MRI confirmed  disc bulge and degeneration approx 1 year ago.  She had 2 epidural injections this year with only minimal results.  She presents with hypermobile lumbar flexion ROM and hamstrings tight at approx 75 degrees bilaterally.  She has weakness along L4-L5 myotomes with occasional left LE instability.  She babysits her grandchildren all ages 61 and under and has to get up and down from the floor a lot during the day.  Her goal is to eliminate pain and be able to continue caring for her grandkids and be able to do yardwork.    Examination-Activity Limitations Caring for  Others;Bend;Lift    Examination-Participation Restrictions Cleaning;Yard Work    Stability/Clinical Decision Making Stable/Uncomplicated    Designer, jewellery Low    Rehab Potential Good    PT Frequency 2x / week    PT Duration 8 weeks    PT Treatment/Interventions ADLs/Self Care Home Management;Aquatic Therapy;Traction;Moist Heat;Iontophoresis 4mg /ml Dexamethasone;Electrical Stimulation;Cryotherapy;Ultrasound;Gait training;Therapeutic exercise;Therapeutic activities;Functional mobility training;Stair training;Balance training;Neuromuscular re-education;Patient/family education;Manual techniques    PT Next Visit Plan Review HEP and assess effectiveness.  Progress core strengthening and add in hamstring and hip flexor/quad stretches modified to avoid nerve root irritation.    PT Home Exercise Plan Access Code: [email or web address]  URL: [email or web address]  Date: 04/10/2021  Prepared by: Candyce Churn    Exercises  Lying Prone with 1 Pillow - 2 x daily - 7 x weekly - 1 sets - 1 reps - 1 min hold  Static Prone on Elbows - 2 x daily - 7 x weekly - 1 sets - 1 reps - 1 min hold  Prone Press Up - 2 x daily - 7 x weekly - 1 sets - 10 reps  Supine Posterior Pelvic Tilt - 2 x daily - 7 x weekly - 2 sets - 10 reps  Supine Dead Bug with Leg Extension - 2 x daily - 7 x weekly - 1 sets - 20 reps             Patient will benefit from skilled therapeutic intervention in order to improve the following deficits and impairments:  Decreased mobility, Difficulty walking, Impaired sensation, Increased muscle spasms, Improper body mechanics, Decreased knowledge of precautions, Decreased strength  Visit Diagnosis: Chronic left-sided low back pain with left-sided sciatica - Plan: PT plan of care cert/re-cert  Muscle weakness (generalized) - Plan: PT plan of care cert/re-cert  Muscle spasm of back - Plan: PT plan of care cert/re-cert     Problem List Patient Active Problem List   Diagnosis Date  Noted   Radiculopathy of lumbar region 09/26/2020   Strain of right tibialis anterior muscle 09/26/2020   Carpal tunnel syndrome 09/19/2019   Thoracic disc disorder 02/04/2018   Inflammatory heel pain 02/22/2016   Osteoarthritis, hand 02/15/2015   Elbow pain, chronic, right 01/03/2015   Cervical radiculopathy at C8 03/21/2014   Degenerative disc disease, cervical 08/18/2013   Degenerative arthritis of lumbar spine 08/18/2013   Achilles tendinitis 01/06/2012   HYPOTHYROIDISM 03/15/2009   Plantar fasciitis, right 03/15/2009   CAVUS DEFORMITY OF FOOT, ACQUIRED 03/15/2009    Anderson Malta B. Momina Hunton, PT 12/28/229:44 AM   [facility] @ [address] [place], [place], [address] Phone: [phone]   Fax:  [phone]  Name: Latissa Frick MRN: [id number] Date of Birth: 21-Aug-1960

## 2021-04-14 DIAGNOSIS — C801 Malignant (primary) neoplasm, unspecified: Secondary | ICD-10-CM

## 2021-04-14 HISTORY — DX: Malignant (primary) neoplasm, unspecified: C80.1

## 2021-05-01 ENCOUNTER — Other Ambulatory Visit: Payer: Self-pay

## 2021-05-01 ENCOUNTER — Ambulatory Visit: Payer: Managed Care, Other (non HMO) | Attending: Internal Medicine

## 2021-05-01 DIAGNOSIS — G8929 Other chronic pain: Secondary | ICD-10-CM | POA: Insufficient documentation

## 2021-05-01 DIAGNOSIS — M62830 Muscle spasm of back: Secondary | ICD-10-CM | POA: Insufficient documentation

## 2021-05-01 DIAGNOSIS — M5442 Lumbago with sciatica, left side: Secondary | ICD-10-CM | POA: Diagnosis present

## 2021-05-01 DIAGNOSIS — M6281 Muscle weakness (generalized): Secondary | ICD-10-CM | POA: Insufficient documentation

## 2021-05-01 NOTE — Therapy (Signed)
[facility] @ [address] [place], [place], [address] Phone: [phone]   Fax:  [phone]  Physical Therapy Treatment  Patient Details  Name: Samantha [name] MRN: [id number] Date of Birth: 17-Apr-1960 Referring Provider (PT): Karlton Lemon, MD   Encounter Date: 05/01/2021   PT End of Session - 05/01/21 1703     Visit Number 2    Date for PT Re-Evaluation 06/05/21    Authorization Type [employer]    PT Start Time 1615    PT Stop Time 1700    PT Time Calculation (min) 45 min    Activity Tolerance Patient tolerated treatment well    Behavior During Therapy [facility] for tasks assessed/performed             Past Medical History:  Diagnosis Date   Allergy    Depression    GERD (gastroesophageal reflux disease)    Right carpal tunnel syndrome 09/19/2019   Thyroid disease     Past Surgical History:  Procedure Laterality Date   TONSILLECTOMY      There were no vitals filed for this visit.   Subjective Assessment - 05/01/21 1624     Subjective Patient states she has been sick and hasn't done much in the way of her exercises.  She rates her pain at 4/10 today.    Currently in Pain? Yes    Pain Score 4     Pain Location Back    Pain Descriptors / Indicators Aching    Pain Type Chronic pain                               [facility] Adult PT Treatment/Exercise - 05/01/21 0001       Exercises   Exercises Lumbar      Lumbar Exercises: Stretches   Active Hamstring Stretch Right;Left;3 reps;30 seconds    Lower Trunk Rotation Other (comment)    Lower Trunk Rotation Limitations 20 reps    Hip Flexor Stretch Right;Left;3 reps;30 seconds    Hip Flexor Stretch Limitations done along with quad stretch    Quad Stretch Right;Left;3 reps;30 seconds      Lumbar Exercises: Aerobic   Nustep 5 min level 3      Lumbar Exercises: Supine   Pelvic Tilt 20 reps    Dead Bug 20 reps    Other Supine Lumbar Exercises supine  ball pass with pelvic tilt (red physio ball, pelvic tilt passing ball from hands to feet 2 x 10      Lumbar Exercises: Prone   Other Prone Lumbar Exercises McKenzie progression: prone lying, prone on elbows x 2 min each, then prone press ups x 10   Patient was unable to complete prone on elbows: radicular symptoms began at 1 min 30 sec     Lumbar Exercises: Quadruped   Opposite Arm/Leg Raise Right arm/Left leg;Left arm/Right leg;20 reps                       PT Short Term Goals - 04/10/21 0934       PT SHORT TERM GOAL #1   Title Independent with initial HEP    Time 4    Period Weeks    Status New    Target Date 05/08/21      PT SHORT TERM GOAL #2   Title The patient will demonstrate good hip hinge technique with lifting grandchildren and home  ADLS    Time 4    Period Weeks    Status New    Target Date 05/08/21               PT Long Term Goals - 04/10/21 0935       PT LONG TERM GOAL #1   Title Patient will be independent with advanced HEP for long term management of symptoms post D/C.    Time 8    Period Weeks    Status New    Target Date 06/05/21      PT LONG TERM GOAL #2   Title Patient will report centralization of radicular symptoms 90% of the time    Time 8    Period Weeks    Status New    Target Date 06/05/21      PT LONG TERM GOAL #3   Title Patient to be able to get up and down from floor without exacerbation of pain    Time 8    Period Weeks    Status New    Target Date 06/05/21      PT LONG TERM GOAL #4   Title Left LE strength to grossly 4+/5 and trunk strength 5-/5 needed for lifting her grandchildren    Time 8    Period Weeks    Status New    Target Date 06/05/21                   Plan - 05/01/21 2308     Clinical Impression Statement Patient had not made significant progress due to being ill.  She had some increase in radicular symptoms with prone on elbows today.  This position was discontinued and LE stretches  were added.  Patient responded well addition of stretches.  She should begin to make progress if more compliant with HEP.  She would benefit from continued skilled PT for core stabilization and LE stretching.    Examination-Activity Limitations Caring for Others;Bend;Lift    Examination-Participation Restrictions Cleaning;Yard Work    Stability/Clinical Decision Making Stable/Uncomplicated    Designer, jewellery Low    Rehab Potential Good    PT Frequency 2x / week    PT Duration 8 weeks    PT Treatment/Interventions ADLs/Self Care Home Management;Aquatic Therapy;Traction;Moist Heat;Iontophoresis 4mg /ml Dexamethasone;Electrical Stimulation;Cryotherapy;Ultrasound;Gait training;Therapeutic exercise;Therapeutic activities;Functional mobility training;Stair training;Balance training;Neuromuscular re-education;Patient/family education;Manual techniques    PT Next Visit Plan Progress core strengthening and review hamstring and hip flexor/quad stretches modified to avoid nerve root irritation.    PT Home Exercise Plan Access Code: [email or web address]  URL: [email or web address]  Date: 04/10/2021  Prepared by: Candyce Churn    Exercises  Lying Prone with 1 Pillow - 2 x daily - 7 x weekly - 1 sets - 1 reps - 1 min hold  Static Prone on Elbows - 2 x daily - 7 x weekly - 1 sets - 1 reps - 1 min hold  Prone Press Up - 2 x daily - 7 x weekly - 1 sets - 10 reps  Supine Posterior Pelvic Tilt - 2 x daily - 7 x weekly - 2 sets - 10 reps  Supine Dead Bug with Leg Extension - 2 x daily - 7 x weekly - 1 sets - 20 reps             Patient will benefit from skilled therapeutic intervention in order to improve the following deficits and impairments:  Decreased mobility, Difficulty walking, Impaired sensation, Increased muscle spasms, Improper body mechanics,  Decreased knowledge of precautions, Decreased strength  Visit Diagnosis: Chronic left-sided low back pain with left-sided sciatica  Muscle weakness  (generalized)  Muscle spasm of back     Problem List Patient Active Problem List   Diagnosis Date Noted   Radiculopathy of lumbar region 09/26/2020   Strain of right tibialis anterior muscle 09/26/2020   Carpal tunnel syndrome 09/19/2019   Thoracic disc disorder 02/04/2018   Inflammatory heel pain 02/22/2016   Osteoarthritis, hand 02/15/2015   Elbow pain, chronic, right 01/03/2015   Cervical radiculopathy at C8 03/21/2014   Degenerative disc disease, cervical 08/18/2013   Degenerative arthritis of lumbar spine 08/18/2013   Achilles tendinitis 01/06/2012   HYPOTHYROIDISM 03/15/2009   Plantar fasciitis, right 03/15/2009   CAVUS DEFORMITY OF FOOT, ACQUIRED 03/15/2009    Anderson Malta B. Kyree Fedorko, PT 05/01/2309:19 PM   [facility] @ [address] [place], [place], [address] Phone: [phone]   Fax:  [phone]  Name: Samantha [name] MRN: [id number] Date of Birth: 08-04-1960

## 2021-05-01 NOTE — Patient Instructions (Signed)
Avoid radicular symptoms with McKenzie extension exercises.  If symptoms worsen, discontinue extension exercises.

## 2021-05-02 ENCOUNTER — Encounter: Payer: Self-pay | Admitting: Family Medicine

## 2021-05-03 ENCOUNTER — Other Ambulatory Visit: Payer: Self-pay

## 2021-05-03 ENCOUNTER — Ambulatory Visit (INDEPENDENT_AMBULATORY_CARE_PROVIDER_SITE_OTHER): Payer: Managed Care, Other (non HMO)

## 2021-05-03 ENCOUNTER — Ambulatory Visit: Payer: Managed Care, Other (non HMO)

## 2021-05-03 DIAGNOSIS — J309 Allergic rhinitis, unspecified: Secondary | ICD-10-CM

## 2021-05-03 DIAGNOSIS — M62830 Muscle spasm of back: Secondary | ICD-10-CM

## 2021-05-03 DIAGNOSIS — M5442 Lumbago with sciatica, left side: Secondary | ICD-10-CM | POA: Diagnosis not present

## 2021-05-03 DIAGNOSIS — M6281 Muscle weakness (generalized): Secondary | ICD-10-CM

## 2021-05-03 DIAGNOSIS — G8929 Other chronic pain: Secondary | ICD-10-CM

## 2021-05-04 NOTE — Patient Instructions (Signed)
Educated patient on spondylolisthesis and instructed patient to discontinue lumbar extension exercises.

## 2021-05-04 NOTE — Therapy (Addendum)
[facility] @ [address] [place], [place], [address] Phone: [phone]   Fax:  [phone]  Physical Therapy Treatment  Patient Details  Name: Samantha [name] MRN: [id number] Date of Birth: July 09, 1960 Referring Provider (PT): Karlton Lemon, MD   Encounter Date: 05/03/2021   PT End of Session - 05/03/21 1743     Visit Number 3    Date for PT Re-Evaluation 06/05/21    Authorization Type [employer]    PT Start Time 0848    PT Stop Time 0930    PT Time Calculation (min) 42 min    Activity Tolerance Patient tolerated treatment well    Behavior During Therapy [facility] for tasks assessed/performed             Past Medical History:  Diagnosis Date   Allergy    Depression    GERD (gastroesophageal reflux disease)    Right carpal tunnel syndrome 09/19/2019   Thyroid disease     Past Surgical History:  Procedure Laterality Date   TONSILLECTOMY      There were no vitals filed for this visit.   Patient states she is doing "much better".  She was able to do her HEP more consistently.  She reports she was able to progress with McKenzie extension to prone on elbows with no radicular symptoms.  However, she brings in MRI report today, with the following:  1. Moderate spinal stenosis at L4-5 due to severe facet arthropathy with degenerative spondylolisthesis. There is additionally a protrusion of the disc on the left with mass effect on the left L5 nerve root in the lateral recess.   2. Severe spinal stenosis at L3-4 due to disc bulge and facet arthropathy.  Due to indication of spondylolisthesis, we will discontinue McKenzie extension.  Treatment:   NuStep x 5 min at level 3  Lumbar Stretches:  Lower trunk rotations x 5 each side holding 10 sec each Iron Cross: x 5 each side holding 10 sec each Piriformis stretch x 5 each side holding 10 sec each  Supine:  Pelvic tilt x 20 Dead Bug x 20 Supine Ball Pass 2 x 10 Dead Bug with red  physio ball x 20                              PT Short Term Goals - 04/10/21 0934       PT SHORT TERM GOAL #1   Title Independent with initial HEP    Time 4    Period Weeks    Status New    Target Date 05/08/21      PT SHORT TERM GOAL #2   Title The patient will demonstrate good hip hinge technique with lifting grandchildren and home ADLS    Time 4    Period Weeks    Status New    Target Date 05/08/21               PT Long Term Goals - 04/10/21 0935       PT LONG TERM GOAL #1   Title Patient will be independent with advanced HEP for long term management of symptoms post D/C.    Time 8    Period Weeks    Status New    Target Date 06/05/21      PT LONG TERM GOAL #2   Title Patient will report centralization of radicular symptoms 90% of the time  Time 8    Period Weeks    Status New    Target Date 06/05/21      PT LONG TERM GOAL #3   Title Patient to be able to get up and down from floor without exacerbation of pain    Time 8    Period Weeks    Status New    Target Date 06/05/21      PT LONG TERM GOAL #4   Title Left LE strength to grossly 4+/5 and trunk strength 5-/5 needed for lifting her grandchildren    Time 8    Period Weeks    Status New    Target Date 06/05/21                   Plan - 05/03/21 1744     Clinical Impression Statement Samantha [name] was able to be more diligent with her HEP.  We were able to access her MRI today and there are multiple levels involved but she also has indication of spondylolisthesis.  We discontinued the extension exercises and focused more on flexibility and core strength today.  She was able to complete all tasks today with no increase in pain and with good technique.  She would benefit from continued skilled PT for LE and trunk flexibility along with core strength.    Examination-Activity Limitations Caring for Others;Bend;Lift    Stability/Clinical Decision Making Stable/Uncomplicated     Clinical Decision Making Low    Rehab Potential Good    PT Frequency 2x / week    PT Duration 8 weeks    PT Treatment/Interventions ADLs/Self Care Home Management;Aquatic Therapy;Traction;Moist Heat;Iontophoresis 4mg /ml Dexamethasone;Electrical Stimulation;Cryotherapy;Ultrasound;Gait training;Therapeutic exercise;Therapeutic activities;Functional mobility training;Stair training;Balance training;Neuromuscular re-education;Patient/family education;Manual techniques    PT Next Visit Plan Progress core strengthening and review hamstring and hip flexor/quad stretches modified to avoid nerve root irritation.    PT Home Exercise Plan Access Code: [email or web address]  URL: [email or web address]  Date: 04/10/2021  Prepared by: Candyce Churn    Exercises  Lying Prone with 1 Pillow - 2 x daily - 7 x weekly - 1 sets - 1 reps - 1 min hold  Static Prone on Elbows - 2 x daily - 7 x weekly - 1 sets - 1 reps - 1 min hold  Prone Press Up - 2 x daily - 7 x weekly - 1 sets - 10 reps  Supine Posterior Pelvic Tilt - 2 x daily - 7 x weekly - 2 sets - 10 reps  Supine Dead Bug with Leg Extension - 2 x daily - 7 x weekly - 1 sets - 20 reps             Patient will benefit from skilled therapeutic intervention in order to improve the following deficits and impairments:  Decreased mobility, Difficulty walking, Impaired sensation, Increased muscle spasms, Improper body mechanics, Decreased knowledge of precautions, Decreased strength  Visit Diagnosis: Chronic left-sided low back pain with left-sided sciatica  Muscle weakness (generalized)  Muscle spasm of back     Problem List Patient Active Problem List   Diagnosis Date Noted   Radiculopathy of lumbar region 09/26/2020   Strain of right tibialis anterior muscle 09/26/2020   Carpal tunnel syndrome 09/19/2019   Thoracic disc disorder 02/04/2018   Inflammatory heel pain 02/22/2016   Osteoarthritis, hand 02/15/2015   Elbow pain, chronic, right 01/03/2015    Cervical radiculopathy at C8 03/21/2014   Degenerative disc disease, cervical 08/18/2013   Degenerative arthritis of lumbar spine 08/18/2013  Achilles tendinitis 01/06/2012   HYPOTHYROIDISM 03/15/2009   Plantar fasciitis, right 03/15/2009   CAVUS DEFORMITY OF FOOT, ACQUIRED 03/15/2009    Anderson Malta B. Eunique Balik, PT 01/21/235:54 PM   [facility] @ [address], [place], [address] Phone: [phone]   Fax:  [phone]  Name: Samantha [name] MRN: [id number] Date of Birth: 1960/10/02

## 2021-05-08 ENCOUNTER — Other Ambulatory Visit: Payer: Self-pay

## 2021-05-08 ENCOUNTER — Ambulatory Visit: Payer: Managed Care, Other (non HMO)

## 2021-05-08 DIAGNOSIS — M62830 Muscle spasm of back: Secondary | ICD-10-CM

## 2021-05-08 DIAGNOSIS — M5442 Lumbago with sciatica, left side: Secondary | ICD-10-CM | POA: Diagnosis not present

## 2021-05-08 DIAGNOSIS — M6281 Muscle weakness (generalized): Secondary | ICD-10-CM

## 2021-05-08 NOTE — Patient Instructions (Signed)
Added plank for core strength

## 2021-05-08 NOTE — Therapy (Signed)
[facility] @ [address] [place], [place], [address] Phone: [phone]   Fax:  [phone]  Physical Therapy Treatment  Patient Details  Name: Samantha [name] MRN: [id number] Date of Birth: Nov 10, 1960 Referring Provider (PT): Karlton Lemon, MD   Encounter Date: 05/08/2021   PT End of Session - 05/08/21 2236     Visit Number 4    Date for PT Re-Evaluation 06/05/21    Authorization Type [employer]    PT Start Time 1530    PT Stop Time 1612    PT Time Calculation (min) 42 min    Activity Tolerance Patient tolerated treatment well    Behavior During Therapy Samantha [name] for tasks assessed/performed             Past Medical History:  Diagnosis Date   Allergy    Depression    GERD (gastroesophageal reflux disease)    Right carpal tunnel syndrome 09/19/2019   Thyroid disease     Past Surgical History:  Procedure Laterality Date   TONSILLECTOMY      There were no vitals filed for this visit.   Subjective Assessment - 05/08/21 1541     Subjective Patient states she did fairly well after last visit but did a lot of cooking the other day and ended up having a really bad night trying to sleep.  She states that during the day, it really isn't that bothersome but once she settles down at night and once she is still in bed for a couple of hours is when she has her worst pain.  She states she is up and down tossing and turning most every night.  She is not able to take Nsaids due to some stomach and kidney issues.  We discussed the possibility of a stronger pain med for nighttime if MD agrees.  She will message her MD on My Chart to inquire.    Limitations Standing;Lifting    Currently in Pain? Yes    Pain Score 3     Pain Location Back    Pain Orientation Right;Left    Pain Descriptors / Indicators Discomfort    Pain Type Chronic pain    Pain Onset More than a month ago                               [facility] Adult PT  Treatment/Exercise - 05/08/21 0001       Exercises   Exercises Lumbar      Lumbar Exercises: Stretches   Active Hamstring Stretch Right;Left;3 reps;30 seconds    Lower Trunk Rotation Other (comment)    Lower Trunk Rotation Limitations Iron cross 5 times hold 10 sec    Hip Flexor Stretch Right;Left;3 reps;30 seconds    Hip Flexor Stretch Limitations done along with quad stretch    Piriformis Stretch Right;Left;3 reps;30 seconds      Lumbar Exercises: Supine   Pelvic Tilt 20 reps    Dead Bug 20 reps    Other Supine Lumbar Exercises supine ball pass with pelvic tilt (red physio ball, pelvic tilt passing ball from hands to feet 2 x 10    Other Supine Lumbar Exercises alternating arm and leg with physio ball (dead bug position)      Lumbar Exercises: Quadruped   Opposite Arm/Leg Raise Right arm/Left leg;Left arm/Right leg;20 reps  PT Short Term Goals - 05/08/21 2258       PT SHORT TERM GOAL #1   Title Independent with initial HEP    Time 4    Period Weeks    Status Achieved    Target Date 05/08/21      PT SHORT TERM GOAL #2   Title The patient will demonstrate good hip hinge technique with lifting grandchildren and home ADLS    Baseline 3-26 minutes    Time 4    Period Weeks    Status Achieved    Target Date 05/08/21      PT SHORT TERM GOAL #3   Title The patient will report a 30% improvement in back pain    Time 4    Period Weeks    Status On-going      PT SHORT TERM GOAL #4   Title Lumbar ffexion 50 degrees, extension 15 degrees and symmetrical sidebend to 30 degrees needed for on/off the floor to play with grandchildren    Time 4    Period Weeks    Status On-going               PT Long Term Goals - 04/10/21 0935       PT LONG TERM GOAL #1   Title Patient will be independent with advanced HEP for long term management of symptoms post D/C.    Time 8    Period Weeks    Status New    Target Date 06/05/21      PT LONG  TERM GOAL #2   Title Patient will report centralization of radicular symptoms 90% of the time    Time 8    Period Weeks    Status New    Target Date 06/05/21      PT LONG TERM GOAL #3   Title Patient to be able to get up and down from floor without exacerbation of pain    Time 8    Period Weeks    Status New    Target Date 06/05/21      PT LONG TERM GOAL #4   Title Left LE strength to grossly 4+/5 and trunk strength 5-/5 needed for lifting her grandchildren    Time 8    Period Weeks    Status New    Target Date 06/05/21                   Plan - 05/08/21 2237     Clinical Impression Statement Samantha [name] is responding intermittantly to current plan.  She is unable to take Nsaids and Tylenol does not help for the nighttime pain.  She is very compliant and well motivated.  She may benefit from pain medication or epidural injection to interrupt her pain cycle She would also benefit from continued skilled PT for core stabilization and LE flexibility.    Examination-Activity Limitations Caring for Others;Bend;Lift    Examination-Participation Restrictions Cleaning;Yard Work    Stability/Clinical Decision Making Stable/Uncomplicated    Designer, jewellery Low    Rehab Potential Good    PT Frequency 2x / week    PT Duration 8 weeks    PT Treatment/Interventions ADLs/Self Care Home Management;Aquatic Therapy;Traction;Moist Heat;Iontophoresis 4mg /ml Dexamethasone;Electrical Stimulation;Cryotherapy;Ultrasound;Gait training;Therapeutic exercise;Therapeutic activities;Functional mobility training;Stair training;Balance training;Neuromuscular re-education;Patient/family education;Manual techniques    PT Next Visit Plan Progress core strengthening and review hamstring and hip flexor/quad stretches modified to avoid nerve root irritation.    PT Home Exercise Plan Access Code: [email or web address]  URL: [email or web address]  Date: 04/10/2021  Prepared by: Candyce Churn    Exercises   Lying Prone with 1 Pillow - 2 x daily - 7 x weekly - 1 sets - 1 reps - 1 min hold  Static Prone on Elbows - 2 x daily - 7 x weekly - 1 sets - 1 reps - 1 min hold  Prone Press Up - 2 x daily - 7 x weekly - 1 sets - 10 reps  Supine Posterior Pelvic Tilt - 2 x daily - 7 x weekly - 2 sets - 10 reps  Supine Dead Bug with Leg Extension - 2 x daily - 7 x weekly - 1 sets - 20 reps    Consulted and Agree with Plan of Care Patient             Patient will benefit from skilled therapeutic intervention in order to improve the following deficits and impairments:  Decreased mobility, Difficulty walking, Impaired sensation, Increased muscle spasms, Improper body mechanics, Decreased knowledge of precautions, Decreased strength  Visit Diagnosis: Chronic left-sided low back pain with left-sided sciatica  Muscle weakness (generalized)  Muscle spasm of back     Problem List Patient Active Problem List   Diagnosis Date Noted   Radiculopathy of lumbar region 09/26/2020   Strain of right tibialis anterior muscle 09/26/2020   Carpal tunnel syndrome 09/19/2019   Thoracic disc disorder 02/04/2018   Inflammatory heel pain 02/22/2016   Osteoarthritis, hand 02/15/2015   Elbow pain, chronic, right 01/03/2015   Cervical radiculopathy at C8 03/21/2014   Degenerative disc disease, cervical 08/18/2013   Degenerative arthritis of lumbar spine 08/18/2013   Achilles tendinitis 01/06/2012   HYPOTHYROIDISM 03/15/2009   Plantar fasciitis, right 03/15/2009   CAVUS DEFORMITY OF FOOT, ACQUIRED 03/15/2009    Samantha [name], PT 05/08/2309:00 PM   [facility] @ [address] [place] [place], [place], [address] Phone: [phone]   Fax:  [phone]  Name: Samantha [name] MRN: [id number] Date of Birth: 06-28-1960

## 2021-05-10 ENCOUNTER — Other Ambulatory Visit: Payer: Self-pay

## 2021-05-10 ENCOUNTER — Ambulatory Visit: Payer: Managed Care, Other (non HMO)

## 2021-05-10 DIAGNOSIS — M62830 Muscle spasm of back: Secondary | ICD-10-CM

## 2021-05-10 DIAGNOSIS — M5442 Lumbago with sciatica, left side: Secondary | ICD-10-CM | POA: Diagnosis not present

## 2021-05-10 DIAGNOSIS — G8929 Other chronic pain: Secondary | ICD-10-CM

## 2021-05-10 DIAGNOSIS — M6281 Muscle weakness (generalized): Secondary | ICD-10-CM

## 2021-05-10 NOTE — Therapy (Signed)
[name] @ [address] [place], [place], [address] Phone: [phone]   Fax:  [phone]  Physical Therapy Treatment  Patient Details  Name: Samantha [name] MRN: [id number] Date of Birth: [birth year] Referring Provider (PT): Karlton Lemon, MD   Encounter Date: 05/10/2021   PT End of Session - 05/10/21 0934     Visit Number 5    Date for PT Re-Evaluation 06/05/21    Authorization Type [employer]    PT Start Time 0933    PT Stop Time 1010    PT Time Calculation (min) 37 min    Activity Tolerance Patient tolerated treatment well    Behavior During Therapy [facility] for tasks assessed/performed             Past Medical History:  Diagnosis Date   Allergy    Depression    GERD (gastroesophageal reflux disease)    Right carpal tunnel syndrome 09/19/2019   Thyroid disease     Past Surgical History:  Procedure Laterality Date   TONSILLECTOMY      There were no vitals filed for this visit.                      [facility] Adult PT Treatment/Exercise - 05/10/21 0001       Lumbar Exercises: Supine   Pelvic Tilt 20 reps    Clam 20 reps    Clam Limitations red loop    Bent Knee Raise 20 reps    Bent Knee Raise Limitations bent knee rotations, then bent knee lifts x 20    Dead Bug 20 reps    Other Supine Lumbar Exercises supine ball pass with pelvic tilt (red physio ball, pelvic tilt passing ball from hands to feet 2 x 10    Other Supine Lumbar Exercises alternating arm and leg with physio ball (dead bug position)      Lumbar Exercises: Prone   Opposite Arm/Leg Raise Right arm/Left leg;Left arm/Right leg;20 reps    Other Prone Lumbar Exercises Over 2 pillows, dbl arm raise                       PT Short Term Goals - 05/10/21 1030       PT SHORT TERM GOAL #1   Title Independent with initial HEP    Time 4    Period Weeks    Status Achieved    Target Date 05/08/21      PT SHORT TERM GOAL #2    Title The patient will demonstrate good hip hinge technique with lifting grandchildren and home ADLS    Baseline 6-96 minutes    Time 4    Period Weeks    Status Achieved      PT SHORT TERM GOAL #3   Title The patient will report a 30% improvement in back pain    Time 4    Period Weeks    Status Achieved      PT SHORT TERM GOAL #4   Title Lumbar ffexion 50 degrees, extension 15 degrees and symmetrical sidebend to 30 degrees needed for on/off the floor to play with grandchildren    Time 4    Period Weeks               PT Long Term Goals - 04/10/21 0935       PT LONG TERM GOAL #1   Title Patient will be independent with advanced HEP for  long term management of symptoms post D/C.    Time 8    Period Weeks    Status New    Target Date 06/05/21      PT LONG TERM GOAL #2   Title Patient will report centralization of radicular symptoms 90% of the time    Time 8    Period Weeks    Status New    Target Date 06/05/21      PT LONG TERM GOAL #3   Title Patient to be able to get up and down from floor without exacerbation of pain    Time 8    Period Weeks    Status New    Target Date 06/05/21      PT LONG TERM GOAL #4   Title Left LE strength to grossly 4+/5 and trunk strength 5-/5 needed for lifting her grandchildren    Time 8    Period Weeks    Status New    Target Date 06/05/21                   Plan - 05/10/21 1001     Clinical Impression Statement Samantha [name] responded very well to changing her pain med regimen.  She states she was able to sleep with less tossing and turning and that the pain was more tolerable.  She is very diligent with her HEP.  We added some prone lumbar strengthening with pillows under the abdomen.  She had some mild discomfort when lifting left hip but was able to complete all reps without any increase in that pain.  These prone exercises were added for home.  She would benefit from continued skilled PT for core stabilization due to her  multilevel degenerative areas and spondylolisthesis.    Examination-Activity Limitations Caring for Others;Bend;Lift    Examination-Participation Restrictions Cleaning;Yard Work    Stability/Clinical Decision Making Stable/Uncomplicated    Designer, jewellery Low    Rehab Potential Good    PT Frequency 2x / week    PT Duration 8 weeks    PT Treatment/Interventions ADLs/Self Care Home Management;Aquatic Therapy;Traction;Moist Heat;Iontophoresis 4mg /ml Dexamethasone;Electrical Stimulation;Cryotherapy;Ultrasound;Gait training;Therapeutic exercise;Therapeutic activities;Functional mobility training;Stair training;Balance training;Neuromuscular re-education;Patient/family education;Manual techniques    PT Next Visit Plan Progress core strengthening and review hamstring and hip flexor/quad stretches modified to avoid nerve root irritation.    PT Home Exercise Plan Access Code: [email or web address]  URL: [email or web address]  Date: 04/10/2021  Prepared by: Candyce Churn    Exercises  Lying Prone with 1 Pillow - 2 x daily - 7 x weekly - 1 sets - 1 reps - 1 min hold  Static Prone on Elbows - 2 x daily - 7 x weekly - 1 sets - 1 reps - 1 min hold  Prone Press Up - 2 x daily - 7 x weekly - 1 sets - 10 reps  Supine Posterior Pelvic Tilt - 2 x daily - 7 x weekly - 2 sets - 10 reps  Supine Dead Bug with Leg Extension - 2 x daily - 7 x weekly - 1 sets - 20 reps    Consulted and Agree with Plan of Care Patient             Patient will benefit from skilled therapeutic intervention in order to improve the following deficits and impairments:  Decreased mobility, Difficulty walking, Impaired sensation, Increased muscle spasms, Improper body mechanics, Decreased knowledge of precautions, Decreased strength  Visit Diagnosis: Chronic left-sided low back pain with left-sided sciatica  Muscle weakness (  generalized)  Muscle spasm of back     Problem List Patient Active Problem List   Diagnosis Date  Noted   Radiculopathy of lumbar region 09/26/2020   Strain of right tibialis anterior muscle 09/26/2020   Carpal tunnel syndrome 09/19/2019   Thoracic disc disorder 02/04/2018   Inflammatory heel pain 02/22/2016   Osteoarthritis, hand 02/15/2015   Elbow pain, chronic, right 01/03/2015   Cervical radiculopathy at C8 03/21/2014   Degenerative disc disease, cervical 08/18/2013   Degenerative arthritis of lumbar spine 08/18/2013   Achilles tendinitis 01/06/2012   HYPOTHYROIDISM 03/15/2009   Plantar fasciitis, right 03/15/2009   CAVUS DEFORMITY OF FOOT, ACQUIRED 03/15/2009    Anderson Malta B. Ethanael Veith, PT 05/10/2308:43 AM   [facility] @ [place] [place], [place], [address] Phone: [phone]   Fax:  [phone]  Name: Samantha [name] MRN: [id number] Date of Birth: 01-07-1961

## 2021-05-13 ENCOUNTER — Encounter: Payer: Self-pay | Admitting: Family Medicine

## 2021-05-15 ENCOUNTER — Ambulatory Visit: Payer: Managed Care, Other (non HMO) | Attending: Internal Medicine

## 2021-05-15 ENCOUNTER — Other Ambulatory Visit: Payer: Self-pay

## 2021-05-15 DIAGNOSIS — M6281 Muscle weakness (generalized): Secondary | ICD-10-CM | POA: Insufficient documentation

## 2021-05-15 DIAGNOSIS — M62830 Muscle spasm of back: Secondary | ICD-10-CM | POA: Diagnosis present

## 2021-05-15 DIAGNOSIS — M5442 Lumbago with sciatica, left side: Secondary | ICD-10-CM | POA: Diagnosis not present

## 2021-05-15 DIAGNOSIS — G8929 Other chronic pain: Secondary | ICD-10-CM | POA: Diagnosis present

## 2021-05-15 MED ORDER — CYCLOBENZAPRINE HCL 10 MG PO TABS: 10.0000 mg | ORAL_TABLET | Freq: Two times a day (BID) | ORAL | 1 refills | Status: DC | PRN

## 2021-05-15 NOTE — Therapy (Signed)
[facility] @ [facility] [place], [place], [address] Phone: [phone]   Fax:  [phone]  Physical Therapy Treatment  Patient Details  Name: Samantha [name] MRN: [id number] Date of Birth: 19-Feb-1961 Referring Provider (PT): Karlton Lemon, MD   Encounter Date: 05/15/2021   PT End of Session - 05/15/21 1040     Visit Number 6    Date for PT Re-Evaluation 06/05/21    Authorization Type [employer]    PT Start Time 1015    PT Stop Time 1102    PT Time Calculation (min) 47 min    Activity Tolerance Patient tolerated treatment well    Behavior During Therapy [facility] for tasks assessed/performed             Past Medical History:  Diagnosis Date   Allergy    Depression    GERD (gastroesophageal reflux disease)    Right carpal tunnel syndrome 09/19/2019   Thyroid disease     Past Surgical History:  Procedure Laterality Date   TONSILLECTOMY      There were no vitals filed for this visit.   Subjective Assessment - 05/15/21 1032     Subjective Patient states she has been in contact with MD regarding medication options.  Due to some side effects of several meds she has taken in the past, she is limited to muscle relaxers.  She reports she had to wear some very low heels a couple of days ago and she has been hurting terribly since and has been limping feeling the left knee is unstable.    Limitations Standing;Lifting    Currently in Pain? Yes    Pain Score 4     Pain Location Back    Pain Orientation Lower    Pain Descriptors / Indicators Aching    Pain Type Chronic pain    Pain Onset More than a month ago                               [facility] Adult PT Treatment/Exercise - 05/15/21 0001       Lumbar Exercises: Stretches   Active Hamstring Stretch Right;Left;3 reps;30 seconds    Hip Flexor Stretch Right;Left;3 reps;30 seconds    Hip Flexor Stretch Limitations done along with quad stretch      Lumbar  Exercises: Aerobic   Nustep Level 4 x 5 min      Lumbar Exercises: Supine   Pelvic Tilt 20 reps    Clam 20 reps    Clam Limitations red loop    Dead Bug 20 reps    Straight Leg Raise 20 reps    Straight Leg Raises Limitations with physio ball    Other Supine Lumbar Exercises supine ball pass with pelvic tilt (red physio ball, pelvic tilt passing ball from hands to feet 2 x 10    Other Supine Lumbar Exercises alternating arm and leg with physio ball (dead bug position)                       PT Short Term Goals - 05/10/21 1030       PT SHORT TERM GOAL #1   Title Independent with initial HEP    Time 4    Period Weeks    Status Achieved    Target Date 05/08/21      PT SHORT TERM GOAL #2   Title The patient will  demonstrate good hip hinge technique with lifting grandchildren and home ADLS    Baseline 7-59 minutes    Time 4    Period Weeks    Status Achieved      PT SHORT TERM GOAL #3   Title The patient will report a 30% improvement in back pain    Time 4    Period Weeks    Status Achieved      PT SHORT TERM GOAL #4   Title Lumbar ffexion 50 degrees, extension 15 degrees and symmetrical sidebend to 30 degrees needed for on/off the floor to play with grandchildren    Time 4    Period Weeks               PT Long Term Goals - 04/10/21 0935       PT LONG TERM GOAL #1   Title Patient will be independent with advanced HEP for long term management of symptoms post D/C.    Time 8    Period Weeks    Status New    Target Date 06/05/21      PT LONG TERM GOAL #2   Title Patient will report centralization of radicular symptoms 90% of the time    Time 8    Period Weeks    Status New    Target Date 06/05/21      PT LONG TERM GOAL #3   Title Patient to be able to get up and down from floor without exacerbation of pain    Time 8    Period Weeks    Status New    Target Date 06/05/21      PT LONG TERM GOAL #4   Title Left LE strength to grossly 4+/5 and  trunk strength 5-/5 needed for lifting her grandchildren    Time 8    Period Weeks    Status New    Target Date 06/05/21                   Plan - 05/15/21 1043     Clinical Impression Statement [name] had an exacerbation of symptoms due to wearing low heels to an event.  She is experiencing left leg pain and weakness today and is limping.  She has requested medication but had reactions to several medications in the past and is limited to a muscle relaxer.  She will try this along with Tylenol for her pain control.  We eliminated all prone and quadruped for now until her leg weakness dissipates.    Examination-Activity Limitations Caring for Others;Bend;Lift    Examination-Participation Restrictions Cleaning;Yard Work    Stability/Clinical Decision Making Stable/Uncomplicated    Designer, jewellery Low    Rehab Potential Good    PT Frequency 2x / week    PT Duration 8 weeks    PT Treatment/Interventions ADLs/Self Care Home Management;Aquatic Therapy;Traction;Moist Heat;Iontophoresis 4mg /ml Dexamethasone;Electrical Stimulation;Cryotherapy;Ultrasound;Gait training;Therapeutic exercise;Therapeutic activities;Functional mobility training;Stair training;Balance training;Neuromuscular re-education;Patient/family education;Manual techniques    PT Next Visit Plan Progress core strengthening and review hamstring and hip flexor/quad stretches modified to avoid nerve root irritation.    PT Home Exercise Plan Access Code: [email or web address]  URL: [email or web address]  Date: 04/10/2021  Prepared by: Candyce Churn    Exercises  Lying Prone with 1 Pillow - 2 x daily - 7 x weekly - 1 sets - 1 reps - 1 min hold  Static Prone on Elbows - 2 x daily - 7 x weekly - 1 sets - 1 reps - 1  min hold  Prone Press Up - 2 x daily - 7 x weekly - 1 sets - 10 reps  Supine Posterior Pelvic Tilt - 2 x daily - 7 x weekly - 2 sets - 10 reps  Supine Dead Bug with Leg Extension - 2 x daily - 7 x weekly - 1 sets - 20  reps    Consulted and Agree with Plan of Care Patient             Patient will benefit from skilled therapeutic intervention in order to improve the following deficits and impairments:  Decreased mobility, Difficulty walking, Impaired sensation, Increased muscle spasms, Improper body mechanics, Decreased knowledge of precautions, Decreased strength  Visit Diagnosis: Chronic left-sided low back pain with left-sided sciatica  Muscle weakness (generalized)  Muscle spasm of back     Problem List Patient Active Problem List   Diagnosis Date Noted   Radiculopathy of lumbar region 09/26/2020   Strain of right tibialis anterior muscle 09/26/2020   Carpal tunnel syndrome 09/19/2019   Thoracic disc disorder 02/04/2018   Inflammatory heel pain 02/22/2016   Osteoarthritis, hand 02/15/2015   Elbow pain, chronic, right 01/03/2015   Cervical radiculopathy at C8 03/21/2014   Degenerative disc disease, cervical 08/18/2013   Degenerative arthritis of lumbar spine 08/18/2013   Achilles tendinitis 01/06/2012   HYPOTHYROIDISM 03/15/2009   Plantar fasciitis, right 03/15/2009   CAVUS DEFORMITY OF FOOT, ACQUIRED 03/15/2009    Anderson Malta B. Johnryan Sao, PT 05/15/2309:11 AM   [facility] @ [facility] [place], [place], [address] Phone: [phone]   Fax:  [phone]  Name: Mikel Pyon MRN: [id number] Date of Birth: 10-May-1960

## 2021-05-15 NOTE — Patient Instructions (Signed)
Hold on all prone over pillows and quadruped exercises until leg weakness and pain dissipates.

## 2021-05-22 ENCOUNTER — Other Ambulatory Visit: Payer: Self-pay

## 2021-05-22 ENCOUNTER — Ambulatory Visit: Payer: Managed Care, Other (non HMO)

## 2021-05-22 DIAGNOSIS — M62830 Muscle spasm of back: Secondary | ICD-10-CM

## 2021-05-22 DIAGNOSIS — G8929 Other chronic pain: Secondary | ICD-10-CM

## 2021-05-22 DIAGNOSIS — M6281 Muscle weakness (generalized): Secondary | ICD-10-CM

## 2021-05-22 DIAGNOSIS — M5442 Lumbago with sciatica, left side: Secondary | ICD-10-CM | POA: Diagnosis not present

## 2021-05-22 NOTE — Patient Instructions (Signed)
° °  Lifting Principles  .Maintain proper posture and head alignment. .Slide object as close as possible before lifting. .Move obstacles out of the way. .Test before lifting; ask for help if too heavy. .Tighten stomach muscles without holding breath. .Use smooth movements; do not jerk. .Use legs to do the work, and pivot with feet. .Distribute the work load symmetrically and close to the center of trunk. .Push instead of pull whenever possible.   Squat down and hold basket close to stand. Use leg muscles to do the work.    Avoid twisting or bending back. Pivot around using foot movements, and bend at knees if needed when reaching for articles.        Getting Into / Out of Bed   Lower self to lie down on one side by raising legs and lowering head at the same time. Use arms to assist moving without twisting. Bend both knees to roll onto back if desired. To sit up, start from lying on side, and use same move-ments in reverse. Keep trunk aligned with legs.    Shift weight from front foot to back foot as item is lifted off shelf.    When leaning forward to pick object up from floor, extend one leg out behind. Keep back straight. Hold onto a sturdy support with other hand.      Sit upright, head facing forward. Try using a roll to support lower back. Keep shoulders relaxed, and avoid rounded back. Keep hips level with knees. Avoid crossing legs for long periods.     

## 2021-05-22 NOTE — Therapy (Addendum)
[facility] @ [facility] [address] [place], [place], [address] Phone: [phone]   Fax:  [phone]  Physical Therapy Treatment (Discharged 07/26/21)  Patient Details  Name: Aletta Bixler MRN: [id number] Date of Birth: 12/25/1960 Referring Provider (PT): Norton Blizzard, MD   Encounter Date: 05/22/2021   PT End of Session - 05/22/21 1522     Visit Number 7    Date for PT Re-Evaluation 06/05/21    Authorization Type [employer]    PT Start Time 1445    PT Stop Time 1530    PT Time Calculation (min) 45 min    Activity Tolerance Patient tolerated treatment well    Behavior During Therapy [facility] for tasks assessed/performed             Past Medical History:  Diagnosis Date   Allergy    Depression    GERD (gastroesophageal reflux disease)    Right carpal tunnel syndrome 09/19/2019   Thyroid disease     Past Surgical History:  Procedure Laterality Date   TONSILLECTOMY      There were no vitals filed for this visit.   Subjective Assessment - 05/22/21 1514     Subjective Patient states she is doing much better with addition of muscle relaxer and taking Tylenol arthritis.  She rates her pain at 2/10.  She is sleeping better.  She is only waking up for a minute or 2 and goes right back to sleep.  She is not taking the muscle relaxers every night as they make her groggy in the morning.  She takes them on the weekend.  Overall, she admits, she is seeing the improvement with the addition of the muscle relaxers.    Limitations Standing;Lifting    Currently in Pain? Yes    Pain Score 2     Pain Location Back    Pain Orientation Lower    Pain Descriptors / Indicators Aching    Pain Type Chronic pain    Pain Onset More than a month ago                               [facility] Adult PT Treatment/Exercise - 05/22/21 0001       Self-Care   Self-Care Lifting;ADL's    ADL's instructed in proper body mechanics with dressing and  bathing    Lifting instructed in proper body mechanics with lifting, bending      Lumbar Exercises: Aerobic   Nustep Level 4 x 5 min      Lumbar Exercises: Supine   Pelvic Tilt 20 reps    Dead Bug 20 reps    Other Supine Lumbar Exercises supine ball pass with pelvic tilt (red physio ball, pelvic tilt passing ball from hands to feet 2 x 10    Other Supine Lumbar Exercises alternating arm and leg with physio ball (dead bug position)      Lumbar Exercises: Prone   Opposite Arm/Leg Raise Right arm/Left leg;Left arm/Right leg;20 reps                     PT Education - 05/22/21 2148     Education Details Educated on proper lifting and body mechanics.    Person(s) Educated Patient    Methods Explanation;Demonstration;Handout    Comprehension Verbalized understanding;Returned demonstration;Verbal cues required              PT Short Term Goals -  05/10/21 1030       PT SHORT TERM GOAL #1   Title Independent with initial HEP    Time 4    Period Weeks    Status Achieved    Target Date 05/08/21      PT SHORT TERM GOAL #2   Title The patient will demonstrate good hip hinge technique with lifting grandchildren and home ADLS    Baseline 5-10 minutes    Time 4    Period Weeks    Status Achieved      PT SHORT TERM GOAL #3   Title The patient will report a 30% improvement in back pain    Time 4    Period Weeks    Status Achieved      PT SHORT TERM GOAL #4   Title Lumbar ffexion 50 degrees, extension 15 degrees and symmetrical sidebend to 30 degrees needed for on/off the floor to play with grandchildren    Time 4    Period Weeks               PT Long Term Goals - 04/10/21 0935       PT LONG TERM GOAL #1   Title Patient will be independent with advanced HEP for long term management of symptoms post D/C.    Time 8    Period Weeks    Status New    Target Date 06/05/21      PT LONG TERM GOAL #2   Title Patient will report centralization of radicular  symptoms 90% of the time    Time 8    Period Weeks    Status New    Target Date 06/05/21      PT LONG TERM GOAL #3   Title Patient to be able to get up and down from floor without exacerbation of pain    Time 8    Period Weeks    Status New    Target Date 06/05/21      PT LONG TERM GOAL #4   Title Left LE strength to grossly 4+/5 and trunk strength 5-/5 needed for lifting her grandchildren    Time 8    Period Weeks    Status New    Target Date 06/05/21                   Plan - 05/22/21 2150     Clinical Impression Statement Patient responded well to addition of muscle relaxers prescribed by MD.  She is also continuing to take Tylenol Arthritis to help gain control of her pain.  She was able to complete quadruped alternating arm and leg with minimal hip pain and no radicular symptoms today.    Examination-Activity Limitations Caring for Others;Bend;Lift    Examination-Participation Restrictions Cleaning;Yard Work    Stability/Clinical Decision Making Stable/Uncomplicated    Optometrist Low    Rehab Potential Good    PT Frequency 2x / week    PT Duration 8 weeks    PT Treatment/Interventions ADLs/Self Care Home Management;Aquatic Therapy;Traction;Moist Heat;Iontophoresis 4mg /ml Dexamethasone;Electrical Stimulation;Cryotherapy;Ultrasound;Gait training;Therapeutic exercise;Therapeutic activities;Functional mobility training;Stair training;Balance training;Neuromuscular re-education;Patient/family education;Manual techniques    PT Next Visit Plan Progress core strength as tolerated.    PT Home Exercise Plan Access Code: [email or web address]  URL: [email or web address]  Date: 04/10/2021  Prepared by: Mikey Kirschner    Exercises  Lying Prone with 1 Pillow - 2 x daily - 7 x weekly - 1 sets - 1 reps - 1 min hold  Static  Prone on Elbows - 2 x daily - 7 x weekly - 1 sets - 1 reps - 1 min hold  Prone Press Up - 2 x daily - 7 x weekly - 1 sets - 10 reps  Supine Posterior  Pelvic Tilt - 2 x daily - 7 x weekly - 2 sets - 10 reps  Supine Dead Bug with Leg Extension - 2 x daily - 7 x weekly - 1 sets - 20 reps    Consulted and Agree with Plan of Care Patient             Patient will benefit from skilled therapeutic intervention in order to improve the following deficits and impairments:  Decreased mobility, Difficulty walking, Impaired sensation, Increased muscle spasms, Improper body mechanics, Decreased knowledge of precautions, Decreased strength  Visit Diagnosis: Chronic left-sided low back pain with left-sided sciatica  Muscle weakness (generalized)  Muscle spasm of back     Problem List Patient Active Problem List   Diagnosis Date Noted   Radiculopathy of lumbar region 09/26/2020   Strain of right tibialis anterior muscle 09/26/2020   Carpal tunnel syndrome 09/19/2019   Thoracic disc disorder 02/04/2018   Inflammatory heel pain 02/22/2016   Osteoarthritis, hand 02/15/2015   Elbow pain, chronic, right 01/03/2015   Cervical radiculopathy at C8 03/21/2014   Degenerative disc disease, cervical 08/18/2013   Degenerative arthritis of lumbar spine 08/18/2013   Achilles tendinitis 01/06/2012   HYPOTHYROIDISM 03/15/2009   Plantar fasciitis, right 03/15/2009   CAVUS DEFORMITY OF FOOT, ACQUIRED 03/15/2009  PHYSICAL THERAPY DISCHARGE SUMMARY  Visits from Start of Care: 7  Current functional level related to goals / functional outcomes: See above   Remaining deficits: See above   Education / Equipment: See above   Patient agrees to discharge. Patient goals were partially met. Patient is being discharged due to not returning since the last visit.   Victorino Dike B. Zayna Toste, PT 05/22/2308:05 PM   [facility] @ [facility] [address] [place], [place], [address] Phone: [phone]   Fax:  [phone]  Name: Saanvi Malia MRN: [id number] Date of Birth: [birth year]

## 2021-06-03 ENCOUNTER — Ambulatory Visit (INDEPENDENT_AMBULATORY_CARE_PROVIDER_SITE_OTHER): Payer: Managed Care, Other (non HMO)

## 2021-06-03 DIAGNOSIS — J309 Allergic rhinitis, unspecified: Secondary | ICD-10-CM

## 2021-06-12 ENCOUNTER — Ambulatory Visit (INDEPENDENT_AMBULATORY_CARE_PROVIDER_SITE_OTHER): Payer: Managed Care, Other (non HMO)

## 2021-06-12 ENCOUNTER — Ambulatory Visit: Payer: Managed Care, Other (non HMO) | Admitting: Family Medicine

## 2021-06-12 DIAGNOSIS — J309 Allergic rhinitis, unspecified: Secondary | ICD-10-CM | POA: Diagnosis not present

## 2021-06-17 ENCOUNTER — Ambulatory Visit: Payer: Self-pay

## 2021-06-17 ENCOUNTER — Ambulatory Visit: Payer: Managed Care, Other (non HMO) | Admitting: Family Medicine

## 2021-06-17 ENCOUNTER — Ambulatory Visit
Admission: RE | Admit: 2021-06-17 | Discharge: 2021-06-17 | Disposition: A | Discharge status: Home / Self Care | Payer: Managed Care, Other (non HMO) | Source: Ambulatory Visit | Attending: Family Medicine | Admitting: Family Medicine

## 2021-06-17 VITALS — BP 148/100 | Ht 67.5 in | Wt 176.0 lb

## 2021-06-17 DIAGNOSIS — M25562 Pain in left knee: Secondary | ICD-10-CM

## 2021-06-17 NOTE — Patient Instructions (Addendum)
Get x-rays today of your knee - we will call you with the results. ?You have a strain of your lateral hamstring and a common peroneal nerve contusion. ?Both of these contribute to the feeling that your leg is going to give out. ?Ice over the hamstring tendon area 15 minutes at a time 3-4 times a day. ?Voltaren gel up to 4 times a day topically. ?Tylenol if needed for pain as well. ?Knee brace or sleeve for support when you're up and walking around. ?Do hamstring rehab exercises once a day. ?I would recommend not picking up the kids for the next 2 weeks because of the high risk of falling if the knee were to buckle on you. ?Follow up with me in 2-3 weeks for reevaluation. ?

## 2021-06-18 ENCOUNTER — Encounter: Payer: Self-pay | Admitting: Family Medicine
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
## 2021-06-19 ENCOUNTER — Ambulatory Visit: Payer: Managed Care, Other (non HMO) | Admitting: Family Medicine

## 2021-06-27 ENCOUNTER — Ambulatory Visit (INDEPENDENT_AMBULATORY_CARE_PROVIDER_SITE_OTHER): Payer: Managed Care, Other (non HMO) | Admitting: *Deleted

## 2021-06-27 DIAGNOSIS — J309 Allergic rhinitis, unspecified: Secondary | ICD-10-CM

## 2021-07-10 ENCOUNTER — Ambulatory Visit (INDEPENDENT_AMBULATORY_CARE_PROVIDER_SITE_OTHER): Payer: Managed Care, Other (non HMO) | Admitting: Family Medicine

## 2021-07-10 ENCOUNTER — Encounter: Payer: Self-pay | Admitting: Family Medicine

## 2021-07-10 VITALS — BP 125/63 | Ht 67.5 in | Wt 178.0 lb

## 2021-07-10 DIAGNOSIS — M25562 Pain in left knee: Secondary | ICD-10-CM | POA: Diagnosis not present

## 2021-07-10 DIAGNOSIS — M5416 Radiculopathy, lumbar region: Secondary | ICD-10-CM | POA: Diagnosis not present

## 2021-07-10 MED ORDER — BACLOFEN 10 MG PO TABS: 10.0000 mg | ORAL_TABLET | Freq: Three times a day (TID) | ORAL | 1 refills | Status: DC | PRN

## 2021-07-10 NOTE — Patient Instructions (Signed)
Your knee is doing better. ?Continue the hamstring strengthening - this feeling of hyperextending should resolve with time. ?We will put in an order for a repeat injection for your back. ?Try baclofen up to 3 times a day as needed. ?Voltaren gel up to 4 times a day topically. ?Call me a week after your injection (or send mychart message) to let me know how you're doing. ?
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
## 2021-07-12 ENCOUNTER — Other Ambulatory Visit: Payer: Self-pay | Admitting: Family Medicine

## 2021-07-12 DIAGNOSIS — M5416 Radiculopathy, lumbar region: Secondary | ICD-10-CM

## 2021-07-23 ENCOUNTER — Ambulatory Visit: Payer: Managed Care, Other (non HMO) | Admitting: Sports Medicine

## 2021-07-23 ENCOUNTER — Ambulatory Visit (INDEPENDENT_AMBULATORY_CARE_PROVIDER_SITE_OTHER): Payer: Managed Care, Other (non HMO)

## 2021-07-23 DIAGNOSIS — J309 Allergic rhinitis, unspecified: Secondary | ICD-10-CM | POA: Diagnosis not present

## 2021-07-23 DIAGNOSIS — M4726 Other spondylosis with radiculopathy, lumbar region: Secondary | ICD-10-CM

## 2021-07-23 NOTE — Assessment & Plan Note (Signed)
This has probably progressed based on her report of her MRI. ?The spondylolisthesis may be triggering the issues with radicular pain. ? ?She has difficulty with both amitriptyline and gabapentin so medical options are limited. ? ?Trial with CSI of lumbar spine (ESI) ?

## 2021-07-23 NOTE — Progress Notes (Signed)
PCP: Samantha Infante, MD ? ?Subjective:  ? ?HPI: ?Samantha [name] is a pleasant 61 y.o. female here for follow-up of left buttock and posterior leg pain. ? ?She was seen by Dr. Barbaraann [name] on 07/10/2021 and thought to have some low back radiculopathy.  She was referred to IR to repeat a epidural injection to see if this would improve her pain.  The patient wanted to meet with Dr. Oneida [name] and team today to ensure this was the root of her pain. ? ?She has had pain in the posterior left buttock and a tingling sensation down the posterior aspect of the entire leg into the plantar foot for about the last 2 months.  She has had a history of chronic low back pain and has had an ESI in the past that provided temporary relief.  She reports a deep, achy pain in the left buttock.  She reports a tingling and zinging sensation from the left buttock down the posterior leg into the plantar and lateral aspect of the foot.  Her tingling sensation and pain is worse at night with chronic lying and sitting.  When she is up and walking her pain will usually improve.  She denies any weakness or giving out of the legs.  She has tried ice, heat and Voltaren gel without much relief.  She occasionally take NSAIDs which help only slightly but she is unable to take these more than 3 times a week per her primary care physician due to history of elevated LFTs. ? ? ?BP 132/90   Ht 5' 7.5" (1.715 m)   Wt 178 lb (80.7 kg)   BMI 27.47 kg/m?  ? ? ?  02/23/2020  ?  1:49 PM 08/06/2020  ?  2:46 PM 02/04/2021  ? 11:06 AM 04/01/2021  ?  9:17 AM  ?[place] Adult Exercise  ?Frequency of aerobic exercise (# of days/week) 0 '2 1 1  '$ ?Average time in minutes 0 45 15 15  ?Frequency of strengthening activities (# of days/week) 0 0 0 0  ? ? ?   ? View : No data to display.  ?  ?  ?  ? ? ?    ?Objective:  ?Physical Exam: ? ?Gen: Well-appearing, in no acute distress; non-toxic ?CV: Regular Rate. Well-perfused. Warm.  ?Resp: Breathing unlabored on room air; no  wheezing. ?Psych: Fluid speech in conversation; appropriate affect; normal thought process ?Neuro: Sensation intact throughout. No gross coordination deficits.  ?MSK:  ? ?- Lumbar/LLE:  ?- Inspection: no gross deformity or scoliosis; no swelling or ecchymosis. No skin changes ?- Palpation: No TTP over the spinous processes, paraspinal muscles, or SI joints b/l. There is some mild TTP with deep palpation of the mid-belly of piriformis muscle belly ?- ROM: full active ROM of the lumbar spine in flexion and extension, although extension does recreate some pain ?- Strength: 5/5 strength of lower extremity in L4-S1 nerve root distributions b/l ? *L1/L2: Hip Flexion & Abduction ? *L3/L4: Knee Extension ? *L4/L5: Ankle Dorsiflexion ? *L5: Great Toe Extension - mildly diminished compared to right LE ? *S1: Ankle Plantar Flexion - mildly diminished compared to right LE ?- Neuro: S1 Achilles tendon DTR on the left is diminished compared to the right , otherwise  2+ L4 bilaterally  ?- Provocative Testing: mildly + SLR, + Modified Slump Test, + Tinel's at piriformis ? ?  ?Assessment & Plan:  ?1. Low back pain with left-sided radiculopathy - seemingly in S1 dermatome ?2. Left posterior buttock pain - question  possible component of concominant piriformis syndrome vs. LBP radiculopathy ? ?We had a lengthy discussion with Samantha [name] today regarding the etiology of her pain.  Based on her examination, provocative testing and actually diminished strength and DTR on the left I do believe this is coming from a nerve root from the low back.  She may also have a concomitant piriformis syndrome, although I do not think this is causing the radicular symptoms down the entire leg and into the foot.  At this point, I feel it is best to proceed with the ESI or nerve root directed injection to see what sort of relief she may receive.  She did have an MRI of the lumbar spine but was from outside facility [facility]) that we cannot see, although she  reports a spondylolisthesis. If she does get good relief from the [facility], it may be wise to have her see one of the spine surgeons for next steps.  We will see her back about 6 weeks following her ESI.  We did provide Williams flexion exercises for her to perform in the meantime.  Continue vitamin B6 100 mg daily. ? ?Samantha Barman, DO ?PGY-4, Sports Medicine Fellow ?[place] ? ?This note was dictated using Dragon naturally speaking software and may contain errors in syntax, spelling, or content which have not been identified prior to signing this note.  ? ?I observed and examined the patient with the resident and agree with assessment and plan.  Note reviewed and modified by me. ?Samantha Mcgill, MD ? ? ? ?

## 2021-07-31 ENCOUNTER — Encounter: Payer: Self-pay | Admitting: Sports Medicine

## 2021-08-02 ENCOUNTER — Ambulatory Visit (INDEPENDENT_AMBULATORY_CARE_PROVIDER_SITE_OTHER): Payer: Managed Care, Other (non HMO) | Admitting: *Deleted

## 2021-08-02 DIAGNOSIS — J309 Allergic rhinitis, unspecified: Secondary | ICD-10-CM | POA: Diagnosis not present

## 2021-08-09 ENCOUNTER — Ambulatory Visit (INDEPENDENT_AMBULATORY_CARE_PROVIDER_SITE_OTHER): Payer: Managed Care, Other (non HMO)

## 2021-08-09 DIAGNOSIS — J309 Allergic rhinitis, unspecified: Secondary | ICD-10-CM

## 2021-08-15 ENCOUNTER — Ambulatory Visit
Admission: RE | Admit: 2021-08-15 | Discharge: 2021-08-15 | Disposition: A | Discharge status: Home / Self Care | Payer: Managed Care, Other (non HMO) | Source: Ambulatory Visit | Attending: Family Medicine | Admitting: Family Medicine

## 2021-08-15 DIAGNOSIS — M5416 Radiculopathy, lumbar region: Secondary | ICD-10-CM

## 2021-08-15 MED ORDER — METHYLPREDNISOLONE ACETATE 40 MG/ML INJ SUSP (RADIOLOG
80.0000 mg | Freq: Once | INTRAMUSCULAR | Status: AC
  Administered 2021-08-15: 80 mg via EPIDURAL

## 2021-08-15 MED ORDER — IOPAMIDOL (ISOVUE-M 200) INJECTION 41%
1.0000 mL | Freq: Once | INTRAMUSCULAR | Status: AC
  Administered 2021-08-15: 1 mL via EPIDURAL

## 2021-08-15 NOTE — Discharge Instructions (Signed)

## 2021-08-21 ENCOUNTER — Ambulatory Visit (INDEPENDENT_AMBULATORY_CARE_PROVIDER_SITE_OTHER): Payer: Managed Care, Other (non HMO)

## 2021-08-21 DIAGNOSIS — J309 Allergic rhinitis, unspecified: Secondary | ICD-10-CM

## 2021-08-27 ENCOUNTER — Other Ambulatory Visit: Payer: Self-pay

## 2021-08-27 ENCOUNTER — Encounter: Payer: Self-pay | Admitting: Sports Medicine

## 2021-08-27 DIAGNOSIS — M5416 Radiculopathy, lumbar region: Secondary | ICD-10-CM

## 2021-08-28 ENCOUNTER — Other Ambulatory Visit: Payer: Self-pay | Admitting: Sports Medicine

## 2021-08-28 DIAGNOSIS — M5416 Radiculopathy, lumbar region: Secondary | ICD-10-CM

## 2021-09-02 ENCOUNTER — Ambulatory Visit (INDEPENDENT_AMBULATORY_CARE_PROVIDER_SITE_OTHER): Payer: Managed Care, Other (non HMO)

## 2021-09-02 DIAGNOSIS — J309 Allergic rhinitis, unspecified: Secondary | ICD-10-CM | POA: Diagnosis not present

## 2021-09-10 ENCOUNTER — Ambulatory Visit
Admission: RE | Admit: 2021-09-10 | Discharge: 2021-09-10 | Disposition: A | Discharge status: Home / Self Care | Payer: Managed Care, Other (non HMO) | Source: Ambulatory Visit | Attending: Sports Medicine | Admitting: Sports Medicine

## 2021-09-10 DIAGNOSIS — M5416 Radiculopathy, lumbar region: Secondary | ICD-10-CM

## 2021-09-10 MED ORDER — IOPAMIDOL (ISOVUE-M 200) INJECTION 41%
1.0000 mL | Freq: Once | INTRAMUSCULAR | Status: AC
  Administered 2021-09-10: 1 mL via EPIDURAL

## 2021-09-10 MED ORDER — METHYLPREDNISOLONE ACETATE 40 MG/ML INJ SUSP (RADIOLOG
80.0000 mg | Freq: Once | INTRAMUSCULAR | Status: AC
  Administered 2021-09-10: 80 mg via EPIDURAL

## 2021-09-10 NOTE — Discharge Instructions (Signed)

## 2021-09-24 ENCOUNTER — Ambulatory Visit (INDEPENDENT_AMBULATORY_CARE_PROVIDER_SITE_OTHER): Payer: Managed Care, Other (non HMO) | Admitting: Sports Medicine

## 2021-09-24 DIAGNOSIS — M4726 Other spondylosis with radiculopathy, lumbar region: Secondary | ICD-10-CM | POA: Diagnosis not present

## 2021-09-24 DIAGNOSIS — M5416 Radiculopathy, lumbar region: Secondary | ICD-10-CM

## 2021-09-24 NOTE — Assessment & Plan Note (Signed)
Keep up core flexion series as these help Keep up short walks

## 2021-09-24 NOTE — Progress Notes (Signed)
Chief complaint -follow-up of lumbar radiculopathy  Patient now has had 3 epidural steroid injections The injection in September lasted until February She was sent to physical therapy in February but this actually worsened her symptoms and it does sound like the physical therapy was too aggressive for her with bridges and some 1 leg activity that created more back pain  She had some relief but had hair loss with gabapentin so had to stop it She did not get much relief with either nortriptyline or amitriptyline and the side effects gave her mood changes  In May 1 she went for another epidural injection because of more tingling and cramping radiating down her left leg to the calf Late May a second injection and doing better until a fall last week onto her buttocks Since then tingling is worse/ some calf cramps Pain is not severe - 2 to 3 - but worse with some activities  PE Pleasant F in NAD BP 118/72   Ht '5\' 7"'$  (1.702 m)   Wt 172 lb (78 kg)   BMI 26.94 kg/m   Neuro exam SLR does not increase pain but does increase tingling on left Good strength on testing left lowere extremity from L3 to S2 Reflexes symmetric Able to do toe walk, heel walk and tandem walk Not numb but feels tingling with testing left leg  MRI has shown spondylolysis and some spinal stenosis

## 2021-09-24 NOTE — Assessment & Plan Note (Signed)
I would wait 3 mos before considering another CSI She should work balance Keep up Vit B 6 No new meds  We may refer her to NS if symptoms worsen See me in 2 months

## 2021-10-04 ENCOUNTER — Ambulatory Visit (INDEPENDENT_AMBULATORY_CARE_PROVIDER_SITE_OTHER): Payer: Managed Care, Other (non HMO)

## 2021-10-04 DIAGNOSIS — J309 Allergic rhinitis, unspecified: Secondary | ICD-10-CM | POA: Diagnosis not present

## 2021-10-21 ENCOUNTER — Encounter: Payer: Self-pay | Admitting: Sports Medicine

## 2021-11-05 ENCOUNTER — Ambulatory Visit (INDEPENDENT_AMBULATORY_CARE_PROVIDER_SITE_OTHER): Payer: Managed Care, Other (non HMO)

## 2021-11-05 DIAGNOSIS — J309 Allergic rhinitis, unspecified: Secondary | ICD-10-CM

## 2021-11-07 ENCOUNTER — Ambulatory Visit (INDEPENDENT_AMBULATORY_CARE_PROVIDER_SITE_OTHER): Payer: Managed Care, Other (non HMO) | Admitting: Sports Medicine

## 2021-11-07 VITALS — BP 138/86 | Ht 67.5 in | Wt 173.0 lb

## 2021-11-07 DIAGNOSIS — M5416 Radiculopathy, lumbar region: Secondary | ICD-10-CM | POA: Diagnosis not present

## 2021-11-07 MED ORDER — DULOXETINE HCL 30 MG PO CPEP: 30.0000 mg | ORAL_CAPSULE | Freq: Every day | ORAL | 0 refills | Status: DC

## 2021-11-07 NOTE — Patient Instructions (Signed)
Start Cymbalta '30mg'$  daily If no improvement after 2 weeks, can increase to '60mg'$  daily Call the office if any side effects occur while taking this medicine Follow up in 1 month to check your progress or sooner if needed

## 2021-11-08 NOTE — Progress Notes (Unsigned)
  Samantha [name] - 61 y.o. female MRN [id number]  Date of birth: 29-Feb-1961    CHIEF COMPLAINT:   Follow-up back pain    SUBJECTIVE:   HPI: Patient comes to clinic today to follow-up her lumbar radiculopathy.  She was last seen here on 09/24/2021.  She has now had 3 epidural steroid injections.  She had the first 2 were quite helpful but the third was less helpful.  She unfortunately is continued to have radicular symptoms on the left side down into her left leg.  She had to stop taking the gabapentin due to hair loss.  She did not get much relief with nortriptyline nor amitriptyline either.  She is no longer in physical therapy because her last several sessions she feels like she overdid it and it made her leg pain worse.   ROS:     See HPI  PERTINENT  PMH / PSH FH / / SH:  Past Medical, Surgical, Social, and Family History Reviewed & Updated in the EMR.  Pertinent findings include:  Spinal stenosis  OBJECTIVE: BP 138/86   Ht 5' 7.5" (1.715 m)   Wt 173 lb (78.5 kg)   BMI 26.70 kg/m   Physical Exam:  Vital signs are reviewed.  GEN: Alert and oriented, NAD Pulm: Breathing unlabored PSY: normal mood, congruent affect  MSK: Lumbar spine -on inspection there is no obvious misalignment of her spine.  On palpation she is nontender along her lumbar spinous processes and there is no bony step-off.  She has a positive straight leg raise on the left which elicits numbness and tingling.  She has no issues with forward flexion but has pain with lumbar extension.  She has 5/5 strength in the lower extremities bilaterally.  She has symmetric patellar reflexes.  She is neurovascular intact distally.  ASSESSMENT & PLAN:  1.  Lumbar radiculopathy -We had a long conversation with the patient today regarding her lumbar radiculopathy.  Her spinal stenosis is continuing to cause her issues and unfortunately she has tried many conservative measures and is not having much success.  She has not had much success  with her previous medications but today we will start Cymbalta 30 mg x 1 month.  She will continue her flexion exercises to work on core strength.  If she does not show improvement we could retry gabapentin again given that her side effect of hair loss could have just been a postmenopausal symptom and not a true medication side effect, which the patient voiced willingness to do.  Otherwise, neurosurgery continues to remain on the horizon for her.  She voiced understanding agreed this plan.  She can follow-up in 1 month.  Dortha Kern, MD PGY-4, Sports Medicine Fellow [place]  I observed and examined the patient with the [facility] resident and agree with assessment and plan.  Note reviewed and modified by me. [name], MD

## 2021-11-11 ENCOUNTER — Encounter: Payer: Self-pay | Admitting: Sports Medicine

## 2021-12-02 ENCOUNTER — Other Ambulatory Visit: Payer: Self-pay | Admitting: Obstetrics and Gynecology

## 2021-12-02 ENCOUNTER — Encounter: Payer: Self-pay | Admitting: Sports Medicine

## 2021-12-02 DIAGNOSIS — R928 Other abnormal and inconclusive findings on diagnostic imaging of breast: Secondary | ICD-10-CM

## 2021-12-06 ENCOUNTER — Other Ambulatory Visit: Payer: Self-pay | Admitting: Obstetrics and Gynecology

## 2021-12-06 ENCOUNTER — Ambulatory Visit
Admission: RE | Admit: 2021-12-06 | Discharge: 2021-12-06 | Disposition: A | Discharge status: Home / Self Care | Payer: Managed Care, Other (non HMO) | Source: Ambulatory Visit | Attending: Obstetrics and Gynecology | Admitting: Obstetrics and Gynecology

## 2021-12-06 DIAGNOSIS — R928 Other abnormal and inconclusive findings on diagnostic imaging of breast: Secondary | ICD-10-CM

## 2021-12-06 DIAGNOSIS — R921 Mammographic calcification found on diagnostic imaging of breast: Secondary | ICD-10-CM

## 2021-12-10 ENCOUNTER — Ambulatory Visit: Payer: Managed Care, Other (non HMO) | Admitting: Sports Medicine

## 2021-12-10 DIAGNOSIS — M5416 Radiculopathy, lumbar region: Secondary | ICD-10-CM | POA: Diagnosis not present

## 2021-12-10 MED ORDER — METHYLPREDNISOLONE ACETATE 80 MG/ML IJ SUSP
80.0000 mg | Freq: Once | INTRAMUSCULAR | Status: AC
Start: 2021-12-10 — End: 2021-12-10
  Administered 2021-12-10: 80 mg via INTRA_ARTICULAR

## 2021-12-10 NOTE — Assessment & Plan Note (Signed)
Today I discussed with her that I wondered if some of her radicular pain and not left her with chronic piriformis spasm This might be triggering some of the sciatic irritation but would definitely fit with her point tenderness She feels pain with lifting or with sitting in this area  We decided to try a local injection to see if this would relieve some of the spasm and pain she gets in her hip Procedure:  Injection of left piriformis Consent obtained and verified. Time-out conducted. Noted no overlying erythema, induration, or other signs of local infection. Skin prepped in a sterile fashion. Topical analgesic spray: Ethyl chloride. Completed without difficulty. Meds: solumedrol 80 plus 7 cc lidocaine 1% I was able to palpate the point of maximal tenderness which seem to be at the junction of the middle and outer third of the piriformis After prepping this area and localizing it I performed an injection without difficulty needling the area with several penetrations Advised to call if fevers/chills, erythema, induration, drainage, or persistent bleeding.  Over the next month I want her to do hip range of motion but no real strength exercises We will recheck this in 1 month She is going to track to see if this lessens the pain she gets in that area on a daily basis

## 2021-12-10 NOTE — Progress Notes (Signed)
Chief complaint low back pain that radiates into the left hip  Patient has had chronic left hip pain for over a year now This often has seemed radicular She has known lumbar stenosis She has had 2 injections which gave her some relief for a few months She did try physical therapy but felt like some of the bridges and exercise actually trigger the hip pain and made her symptoms worse She also had 1 fall onto her sacrum that aggravated the hip symptoms as well  We have tried gabapentin which gave her side effects Most recently we tried Cymbalta but that made her much too drowsy to function She had no benefit with amitriptyline  Physical exam Pleasant older female in no acute distress BP 122/78   Ht 5' 7.5" (1.715 m)   BMI 26.70 kg/m  She has full lumbar flexion but at the terminal end feel some tightness in her left hip She has pain referring to the same area in the left hip with lateral bending and with rotation Extension is not painful On examination of the hip she has full range of motion She has good strength She has point tenderness right over the piriformis and the outer one third

## 2021-12-12 ENCOUNTER — Encounter: Payer: Self-pay | Admitting: Sports Medicine

## 2021-12-18 ENCOUNTER — Ambulatory Visit
Admission: RE | Admit: 2021-12-18 | Discharge: 2021-12-18 | Disposition: A | Discharge status: Home / Self Care | Payer: Managed Care, Other (non HMO) | Source: Ambulatory Visit | Attending: Obstetrics and Gynecology | Admitting: Obstetrics and Gynecology

## 2021-12-18 ENCOUNTER — Other Ambulatory Visit: Payer: Self-pay | Admitting: Obstetrics and Gynecology

## 2021-12-18 DIAGNOSIS — R921 Mammographic calcification found on diagnostic imaging of breast: Secondary | ICD-10-CM

## 2021-12-18 DIAGNOSIS — J301 Allergic rhinitis due to pollen: Secondary | ICD-10-CM

## 2021-12-18 NOTE — Progress Notes (Signed)
VIALS EXP 12-19-22

## 2021-12-19 ENCOUNTER — Other Ambulatory Visit: Payer: Self-pay | Admitting: Obstetrics and Gynecology

## 2021-12-19 DIAGNOSIS — C50911 Malignant neoplasm of unspecified site of right female breast: Secondary | ICD-10-CM

## 2021-12-20 ENCOUNTER — Telehealth: Payer: Self-pay | Admitting: Hematology

## 2021-12-20 NOTE — Telephone Encounter (Signed)
Spoke to patient to confirm afternoon clinic appointment for 9/13, paperwork sent via e-mail

## 2021-12-23 ENCOUNTER — Ambulatory Visit
Admission: RE | Admit: 2021-12-23 | Discharge: 2021-12-23 | Discharge status: Home / Self Care | Payer: Managed Care, Other (non HMO) | Source: Ambulatory Visit | Attending: Obstetrics and Gynecology | Admitting: Obstetrics and Gynecology

## 2021-12-23 ENCOUNTER — Encounter: Payer: Self-pay | Admitting: *Deleted

## 2021-12-23 DIAGNOSIS — C50911 Malignant neoplasm of unspecified site of right female breast: Secondary | ICD-10-CM

## 2021-12-23 DIAGNOSIS — Z171 Estrogen receptor negative status [ER-]: Secondary | ICD-10-CM

## 2021-12-23 NOTE — Progress Notes (Signed)
Radiation Oncology         ([phone]) [phone] ________________________________  Name: Samantha [name]        MRN: [id number]  Date of Service: 12/25/2021 DOB: 1960/08/09  W[place]:OMBTDH, [name], MD  Stark Klein, MD     REFERRING PHYSICIAN: Stark Klein, MD   DIAGNOSIS: The encounter diagnosis was Malignant neoplasm of upper-outer quadrant of right breast in female, estrogen receptor negative ([place]).   HISTORY OF PRESENT ILLNESS: Samantha [name] is a 60 y.o. female seen in the multidisciplinary breast clinic for a new diagnosis of right breast cancer. The patient was noted to have  screening detected calcifications in the right breast.  Further diagnostic imaging showed a grouped coarse area of calcifications in the upper outer quadrant of the right breast measuring 6 mm.  She underwent stereotactic biopsy on 12/18/2021.  Final pathology revealed grade 3 DCIS with focal microinvasion.  Her microscopic invasive component was ER/PR negative HER2 was negative with a Ki-67 of 60%.  She returned for ultrasound of the right axilla on 12/23/2021 which was negative for adenopathy.  She is seen today to discuss treatment recommendations of her cancer.    PREVIOUS RADIATION THERAPY: No   PAST MEDICAL HISTORY:  Past Medical History:  Diagnosis Date   Allergy    Anxiety 2015   Result of culmination of super stressful caregiving and deaths of 2 immediate family members.  Overall do pretty well.   Depression    GERD (gastroesophageal reflux disease)    Right carpal tunnel syndrome 09/19/2019   Thyroid disease        PAST SURGICAL HISTORY: Past Surgical History:  Procedure Laterality Date   TONSILLECTOMY       FAMILY HISTORY:  Family History  Problem Relation Age of Onset   Hyperlipidemia Father    Diabetes Maternal Grandmother      SOCIAL HISTORY:  reports that she has never smoked. She has never used smokeless tobacco. She reports that she does not drink alcohol and does not use drugs. The patient is  married and lives in [place]. 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   ALLERGIES: Nortriptyline, Omeprazole, Singulair [montelukast sodium], and Sulfonamide derivatives   MEDICATIONS:  Current Outpatient Medications  Medication Sig Dispense Refill   baclofen (LIORESAL) 10 MG tablet Take 1 tablet (10 mg total) by mouth 3 (three) times daily as needed for muscle spasms. 60 each 1   Calcium Carb-Cholecalciferol (CALCIUM-VITAMIN D3) 250-125 MG-UNIT TABS Take 1 tablet once a day     Cholecalciferol (VITAMIN D3) 1000 units CAPS Take 1,000 Units by mouth daily.     diclofenac Sodium (VOLTAREN) 1 % GEL Apply 2 g topically 4 (four) times daily. 100 g 3   EPINEPHrine 0.3 mg/0.3 mL IJ SOAJ injection Inject 0.3 mg into the muscle as needed for anaphylaxis. 2 each 1   hydrocortisone 2.5 % ointment   1   sertraline (ZOLOFT) 50 MG tablet Take 50 mg by mouth daily.     TIROSINT 100 MCG CAPS Take by mouth.     TIROSINT 88 MCG CAPS   10   No current facility-administered medications for this encounter.     REVIEW OF SYSTEMS: On review of systems, the patient reports that she is doing well since meeting with all of her providers. No breast specific complaints are verbalized.      PHYSICAL EXAM:  Wt Readings from Last 3 Encounters:  12/25/21 169 lb 12.8 oz (77 kg)  11/07/21 173 lb (78.5 kg)  09/24/21 172 lb (78 kg)   Temp Readings from Last 3 Encounters:  12/25/21 98.1 F (36.7 C) (Temporal)  11/09/20 (!) 97.2 F (36.2 C)  09/08/19 98.4 F (36.9 C) (Temporal)   BP Readings from Last 3 Encounters:  12/25/21 (!) 140/85  12/10/21 122/78  11/07/21 138/86   Pulse Readings from Last 3 Encounters:  12/25/21 (!) 106  09/10/21 86  08/15/21 73    In general this is a well appearing caucasian female in no acute distress. She's alert and oriented x4 and appropriate throughout the examination. Cardiopulmonary assessment is negative for acute distress and she exhibits normal effort. Bilateral breast  exam is deferred.    ECOG = 0  0 - Asymptomatic (Fully active, able to carry on all predisease activities without restriction)  1 - Symptomatic but completely ambulatory (Restricted in physically strenuous activity but ambulatory and able to carry out work of a light or sedentary nature. For example, light housework, office work)  2 - Symptomatic, <50% in bed during the day (Ambulatory and capable of all self care but unable to carry out any work activities. Up and about more than 50% of waking hours)  3 - Symptomatic, >50% in bed, but not bedbound (Capable of only limited self-care, confined to bed or chair 50% or more of waking hours)  4 - Bedbound (Completely disabled. Cannot carry on any self-care. Totally confined to bed or chair)  5 - Death   Eustace Pen MM, Creech RH, Tormey DC, et al. [phone]). "Toxicity and response criteria of the [facility]". [place] Oncol. 5 (6): 649-55    LABORATORY DATA:  Lab Results  Component Value Date   WBC 11.4 (H) 12/25/2021   HGB 15.0 12/25/2021   HCT 45.8 12/25/2021   MCV 93.3 12/25/2021   PLT 306 12/25/2021   Lab Results  Component Value Date   NA 137 12/25/2021   K 3.6 12/25/2021   CL 104 12/25/2021   CO2 27 12/25/2021   Lab Results  Component Value Date   ALT 44 12/25/2021   AST 28 12/25/2021   ALKPHOS 104 12/25/2021   BILITOT 0.5 12/25/2021      RADIOGRAPHY: [place] AXILLA RIGHT  Result Date: 12/23/2021 CLINICAL DATA:  61 year old female with newly diagnosed RIGHT breast cancer. Evaluate RIGHT axilla. EXAM: ULTRASOUND OF THE RIGHT AXILLA COMPARISON:  None available. FINDINGS: Ultrasound is performed, showing no abnormal appearing RIGHT axillary lymph nodes. IMPRESSION: No abnormal appearing RIGHT axillary lymph nodes. RECOMMENDATION: Treatment plan for RIGHT breast cancer. I have discussed the findings and recommendations with the patient. If applicable, a reminder letter will be sent to the patient regarding  the next appointment. BI-RADS CATEGORY  1: Negative. Electronically Signed   By: Margarette Canada M.D.   On: 12/23/2021 14:59  MM RT BREAST BX W LOC DEV 1ST LESION IMAGE BX SPEC STEREO GUIDE  Addendum Date: 12/23/2021   ADDENDUM REPORT: 12/23/2021 08:59 ADDENDUM: Pathology revealed GRADE III DUCTAL CARCINOMA IN SITU, SOLID AND CRIBRIFORM TYPE WITH COMEDONECROSIS AND ASSOCIATED CALCIFICATIONS of the RIGHT breast, upper outer quadrant, (x clip). FOCAL MICROINVASION IS PRESENT (LESS THAN 1 MM) WITH EVIDENCE OF LYMPHOVASCULAR INVASION . This was found to be concordant by Dr. Nolon Nations. Pathology results were discussed with the patient by telephone. The patient reported doing well after the biopsy with tenderness at the site. Post biopsy instructions and care were reviewed and questions were answered. The patient was encouraged to call [facility]  for any additional concerns. My direct phone number was provided. The patient was referred to [facility] on December 25, 2021. The patient is scheduled for a RIGHT axillary ultrasound on December 23, 2021 due to the focal microinvasion. Pathology results reported by Terie Purser, RN on 12/19/2021. Electronically Signed   By: Nolon Nations M.D.   On: 12/23/2021 08:59   Result Date: 12/23/2021 CLINICAL DATA:  Patient presents for stereotactic guided core biopsy of RIGHT breast calcifications. EXAM: RIGHT BREAST STEREOTACTIC CORE NEEDLE BIOPSY COMPARISON:  Previous exam(s). FINDINGS: The patient and I discussed the procedure of stereotactic-guided biopsy including benefits and alternatives. We discussed the high likelihood of a successful procedure. We discussed the risks of the procedure including infection, bleeding, tissue injury, clip migration, and inadequate sampling. Informed written consent was given. The usual time out protocol was performed immediately prior  to the procedure. Using sterile technique and lidocaine and lidocaine with epinephrine as local anesthetic, under stereotactic guidance, a 9 gauge vacuum assisted device was used to perform core needle biopsy of calcifications in the UPPER-OUTER QUADRANT of the RIGHT breast using a LATERAL approach. Specimen radiograph was performed showing calcifications in numerous tissue samples. Specimens with calcifications are identified for pathology. Lesion quadrant: UPPER-OUTER QUADRANT RIGHT breast At the conclusion of the procedure, X shaped tissue marker clip was deployed into the biopsy cavity. Follow-up 2-view mammogram was performed and dictated separately. IMPRESSION: Stereotactic-guided biopsy of RIGHT breast calcifications. No apparent complications. Electronically Signed: By: Nolon Nations M.D. On: 12/18/2021 09:11  MM CLIP PLACEMENT RIGHT  Result Date: 12/18/2021 CLINICAL DATA:  Status post stereotactic biopsy of RIGHT breast calcifications. EXAM: 3D DIAGNOSTIC RIGHT MAMMOGRAM POST STEREOTACTIC BIOPSY COMPARISON:  Previous exam(s). FINDINGS: 3D Mammographic images were obtained following stereotactic guided biopsy of calcifications in the UPPER-OUTER QUADRANT of the RIGHT breast and placement of an X shaped clip. The biopsy marking clip is 5 millimeters MEDIAL to the residual calcifications. IMPRESSION: The tissue marker x clip is 5 millimeters MEDIAL to residual calcifications. Final Assessment: Post Procedure Mammograms for Marker Placement Electronically Signed   By: Nolon Nations M.D.   On: 12/18/2021 09:21  MM Digital Diagnostic Unilat R  Result Date: 12/06/2021 CLINICAL DATA:  Patient returns today to evaluate RIGHT breast calcifications identified on a recent screening mammogram. EXAM: DIGITAL DIAGNOSTIC UNILATERAL RIGHT MAMMOGRAM TECHNIQUE: Right digital diagnostic mammography was performed. COMPARISON:  Previous exam(s). ACR Breast Density Category b: There are scattered areas of  fibroglandular density. FINDINGS: On today's additional diagnostic views, including magnification views, grouped coarse heterogeneous calcifications are confirmed within the upper-outer quadrant of the RIGHT breast, measuring 6 mm extent. IMPRESSION: Grouped coarse heterogeneous calcifications within the upper-outer quadrant of the RIGHT breast, measuring 6 mm extent. These may be fibroadenomatous calcifications. Stereotactic biopsy is recommended to exclude malignancy. RECOMMENDATION: Stereotactic biopsy for the RIGHT breast calcifications. Stereotactic biopsy is scheduled on September 6th. I have discussed the findings and recommendations with the patient. If applicable, a reminder letter will be sent to the patient regarding the next appointment. BI-RADS CATEGORY  4: Suspicious. Electronically Signed   By: Franki Cabot M.D.   On: 12/06/2021 11:10      IMPRESSION/PLAN: 1. Stage IA-B, [id number], Triple negative, grade 3 triple negative invasive ductal carcinoma of the right breast. Dr. Lisbeth Renshaw discusses the pathology findings and reviews the nature of early stage breast disease. The consensus from the breast conference includes breast conservation with lumpectomy with second  surgery if invasive disease is seen with possible sentinel node biopsy. Dr. Burr Medico recommends reviewing her final pathology but discusses the possibility of adjuvant chemotherapy. Dr. Lisbeth Renshaw would also recommend external radiotherapy to the breast  to reduce risks of local recurrence. We discussed the risks, benefits, short, and long term effects of radiotherapy, as well as the curative intent, and the patient is interested in proceeding. Dr. Lisbeth Renshaw discusses the delivery and logistics of radiotherapy and anticipates a course of 4 or up to 6 1/2 weeks of radiotherapy. We will see her back a few weeks completing chemotherapy to discuss the simulation process and anticipate we starting radiotherapy about 4-6 weeks thereafter. 2. Possible genetic  predisposition to malignancy. The patient is a candidate for genetic testing given her personal  history. She will meet with our geneticist today in clinic.   In a visit lasting 60 minutes, greater than 50% of the time was spent face to face reviewing her case, as well as in preparation of, discussing, and coordinating the patient's care.  The above documentation reflects my direct findings during this shared patient visit. Please see the separate note by Dr. Lisbeth Renshaw on this date for the remainder of the patient's plan of care.    Carola Rhine, [facility]    **Disclaimer: This note was dictated with voice recognition software. Similar sounding words can inadvertently be transcribed and this note may contain transcription errors which may not have been corrected upon publication of note.**

## 2021-12-24 ENCOUNTER — Other Ambulatory Visit: Payer: Self-pay | Admitting: General Surgery

## 2021-12-24 DIAGNOSIS — Z171 Estrogen receptor negative status [ER-]: Secondary | ICD-10-CM

## 2021-12-25 ENCOUNTER — Inpatient Hospital Stay: Payer: Managed Care, Other (non HMO) | Attending: Hematology | Admitting: Hematology

## 2021-12-25 ENCOUNTER — Encounter: Payer: Self-pay | Admitting: *Deleted

## 2021-12-25 ENCOUNTER — Ambulatory Visit: Payer: Managed Care, Other (non HMO) | Admitting: Physical Therapy

## 2021-12-25 ENCOUNTER — Ambulatory Visit
Admission: RE | Admit: 2021-12-25 | Discharge: 2021-12-25 | Disposition: A | Discharge status: Home / Self Care | Payer: Managed Care, Other (non HMO) | Source: Ambulatory Visit | Attending: Radiation Oncology | Admitting: Radiation Oncology

## 2021-12-25 ENCOUNTER — Inpatient Hospital Stay: Payer: Managed Care, Other (non HMO)

## 2021-12-25 ENCOUNTER — Encounter: Payer: Self-pay | Admitting: Hematology

## 2021-12-25 ENCOUNTER — Inpatient Hospital Stay: Payer: Managed Care, Other (non HMO) | Admitting: Genetic Counselor

## 2021-12-25 VITALS — BP 140/85 | HR 106 | Temp 98.1°F | Resp 18 | Ht 67.0 in | Wt 169.8 lb

## 2021-12-25 DIAGNOSIS — C50411 Malignant neoplasm of upper-outer quadrant of right female breast: Secondary | ICD-10-CM

## 2021-12-25 DIAGNOSIS — F419 Anxiety disorder, unspecified: Secondary | ICD-10-CM | POA: Insufficient documentation

## 2021-12-25 DIAGNOSIS — Z171 Estrogen receptor negative status [ER-]: Secondary | ICD-10-CM | POA: Diagnosis not present

## 2021-12-25 DIAGNOSIS — Z8349 Family history of other endocrine, nutritional and metabolic diseases: Secondary | ICD-10-CM | POA: Diagnosis not present

## 2021-12-25 DIAGNOSIS — Z882 Allergy status to sulfonamides status: Secondary | ICD-10-CM | POA: Diagnosis not present

## 2021-12-25 DIAGNOSIS — F32A Depression, unspecified: Secondary | ICD-10-CM | POA: Diagnosis not present

## 2021-12-25 DIAGNOSIS — E039 Hypothyroidism, unspecified: Secondary | ICD-10-CM | POA: Insufficient documentation

## 2021-12-25 DIAGNOSIS — Z79899 Other long term (current) drug therapy: Secondary | ICD-10-CM | POA: Diagnosis not present

## 2021-12-25 DIAGNOSIS — Z833 Family history of diabetes mellitus: Secondary | ICD-10-CM | POA: Insufficient documentation

## 2021-12-25 LAB — CBC WITH DIFFERENTIAL (CANCER CENTER ONLY)
Abs Immature Granulocytes: 0.03 10*3/uL (ref 0.00–0.07)
Basophils Absolute: 0.1 10*3/uL (ref 0.0–0.1)
Basophils Relative: 1 %
Eosinophils Absolute: 0.2 10*3/uL (ref 0.0–0.5)
Eosinophils Relative: 2 %
HCT: 45.8 % (ref 36.0–46.0)
Hemoglobin: 15 g/dL (ref 12.0–15.0)
Immature Granulocytes: 0 %
Lymphocytes Relative: 16 %
Lymphs Abs: 1.8 10*3/uL (ref 0.7–4.0)
MCH: 30.5 pg (ref 26.0–34.0)
MCHC: 32.8 g/dL (ref 30.0–36.0)
MCV: 93.3 fL (ref 80.0–100.0)
Monocytes Absolute: 0.6 10*3/uL (ref 0.1–1.0)
Monocytes Relative: 5 %
Neutro Abs: 8.7 10*3/uL — ABNORMAL HIGH (ref 1.7–7.7)
Neutrophils Relative %: 76 %
Platelet Count: 306 10*3/uL (ref 150–400)
RBC: 4.91 MIL/uL (ref 3.87–5.11)
RDW: 12.8 % (ref 11.5–15.5)
WBC Count: 11.4 10*3/uL — ABNORMAL HIGH (ref 4.0–10.5)
nRBC: 0 % (ref 0.0–0.2)

## 2021-12-25 LAB — CMP (CANCER CENTER ONLY)
ALT: 44 U/L (ref 0–44)
AST: 28 U/L (ref 15–41)
Albumin: 4.6 g/dL (ref 3.5–5.0)
Alkaline Phosphatase: 104 U/L (ref 38–126)
Anion gap: 6 (ref 5–15)
BUN: 13 mg/dL (ref 8–23)
CO2: 27 mmol/L (ref 22–32)
Calcium: 10.1 mg/dL (ref 8.9–10.3)
Chloride: 104 mmol/L (ref 98–111)
Creatinine: 0.66 mg/dL (ref 0.44–1.00)
GFR, Estimated: >60 mL/min (ref >60)
Glucose, Bld: 156 mg/dL — ABNORMAL HIGH (ref 70–99)
Potassium: 3.6 mmol/L (ref 3.5–5.1)
Sodium: 137 mmol/L (ref 135–145)
Total Bilirubin: 0.5 mg/dL (ref 0.3–1.2)
Total Protein: 8.1 g/dL (ref 6.5–8.1)

## 2021-12-25 NOTE — Progress Notes (Signed)
[place]   [phone] Fax:([phone]) [place] Note   Patient Care Team: Crist Infante, MD as PCP - General (Internal Medicine) Mauro Kaufmann, RN as Oncology Nurse Navigator [name], RN as Oncology Nurse Navigator Stark Klein, MD as Consulting Physician (General Surgery) Truitt Merle, MD as Consulting Physician (Hematology) Kyung Rudd, MD as Consulting Physician (Radiation Oncology) Marcene Corning, MD as Consulting Physician (Endocrinology) Stefanie Libel, MD as Consulting Physician ([place]) Marylynn Pearson, MD as Consulting Physician (Obstetrics and Gynecology)  Date of Service:  12/25/2021   CHIEF COMPLAINTS/PURPOSE OF CONSULTATION:  Right Breast Cancer, ER-  REFERRING PHYSICIAN:  [facility]   ASSESSMENT & PLAN:  Samantha [name] is a 61 y.o. postmenopausal female with a history of hypothyroidism  1. Malignant neoplasm of upper-outer quadrant of right breast, Stage 0 or IA, triple negative, Grade 3 -found on screening mammogram. Right diagnostic MM on 12/06/21 showed 6 mm calcifications within upper-outer quadrant. Biopsy on 12/18/21 confirmed DCIS with focal microinvasion present with lymphovascular invasion. -I discussed her breast imaging and needle biopsy results with patient and her husband in great detail. -She is a candidate for breast conservation surgery. She has been seen by breast surgeon Dr. Barry Dienes, who recommends lumpectomy but not lymph node dissection. -We also discussed that her biopsy did show an area of microinvasion. While this was possibly completely removed with biopsy, we will await the final pathology before making any decisions about adjuvant treatment.  -if she has DCIS alone, this will be cured by complete surgical resection. Any form of adjuvant therapy will be preventive. -Given her negative ER and PR, I do not recommend antiestrogen therapy.  -If she has invasive carcinoma on her final  surgical path, tumor is less than 5 mm, she would not need adjuvant chemotherapy. -She will likely benefit from breast radiation if she undergo lumpectomy to decrease the risk of breast cancer. She will discuss this further with Dr. Lisbeth Renshaw today.  -We discussed breast cancer surveillance after she completes treatment, Including annual mammogram, breast exam every 6-12 months.   PLAN:  -proceed with lumpectomy -f/u open, depending on surgical path.    Oncology History  Malignant neoplasm of upper-outer quadrant of right breast in female, estrogen receptor negative ([place])  12/06/2021 Mammogram   CLINICAL DATA:  Patient returns today to evaluate RIGHT breast calcifications identified on a recent screening mammogram.   EXAM: DIGITAL DIAGNOSTIC UNILATERAL RIGHT MAMMOGRAM  IMPRESSION: Grouped coarse heterogeneous calcifications within the upper-outer quadrant of the RIGHT breast, measuring 6 mm extent. These may be fibroadenomatous calcifications. Stereotactic biopsy is recommended to exclude malignancy.   12/18/2021 Initial Biopsy   Diagnosis Breast, right, needle core biopsy, upper outer quadrant, x clip - DUCTAL CARCINOMA IN SITU, SOLID AND CRIBRIFORM TYPE WITH COMEDONECROSIS AND ASSOCIATED CALCIFICATIONS, NUCLEAR GRADE 3 OF 3 - FOCAL MICROINVASION IS PRESENT (LESS THAN 1 MM) WITH EVIDENCE OF LYMPHOVASCULAR INVASION - NECROSIS: PRESENT - CALCIFICATIONS: PRESENT - DCIS LENGTH: 7 MM IN GREATEST LINEAR DIMENSION ON FRAGMENTED CORES  PROGNOSTIC INDICATORS Results: IMMUNOHISTOCHEMICAL AND MORPHOMETRIC ANALYSIS PERFORMED MANUALLY The tumor cells are NEGATIVE for Her2 (1+). Estrogen Receptor: 0%, NEGATIVE Progesterone Receptor: 0%, NEGATIVE Proliferation Marker Ki67: 60%   12/23/2021 Initial Diagnosis   Malignant neoplasm of upper-outer quadrant of right breast in female, estrogen receptor negative ([place])   12/23/2021 Imaging   EXAM: ULTRASOUND OF THE RIGHT AXILLA  IMPRESSION: No  abnormal appearing RIGHT axillary lymph nodes.  HISTORY OF PRESENTING ILLNESS:  Samantha [name] 61 y.o. female is a here because of breast cancer. The patient was [facility] by [facility]. The patient presents to the clinic today accompanied by her husband.   She had routine screening mammography showing calcifications in the right breast. She underwent right diagnostic mammography on 12/06/21 showing: 6 mm grouped calcifications within upper-outer right breast.  Biopsy on 12/18/21 showed: DCIS, solid and cribriform type with comedonecrosis and associated calcifications, grade 3; focal microinvasion with evidence of lymphovascular invasion. Prognostic indicators significant for: estrogen receptor, 0% negative and progesterone receptor, 0% negative. Proliferation marker Ki67 at 60%. HER2 negative (1+).   She has a PMHx of.... -mild depression and anxiety, on zoloft -hypothyroidism, on tirosint  Socially... 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 -she is married with one son, age 33 -no family history of cancer to her knowledge -never smoker, rarely drinks alcohol   GYN HISTORY  Menarchal: 5-30 years old LMP: 12's? Contraceptive: used ~28 years, no issues HRT: used on and off for ~4 years, d/c 12/2018 GP: 1, at age 49   REVIEW OF SYSTEMS:    Constitutional: Denies fevers, chills or abnormal night sweats Eyes: Denies blurriness of vision, double vision or watery eyes Ears, nose, mouth, throat, and face: Denies mucositis or sore throat Respiratory: Denies cough, dyspnea or wheezes Cardiovascular: Denies palpitation, chest discomfort or lower extremity swelling Gastrointestinal:  Denies nausea, heartburn or change in bowel habits Skin: Denies abnormal skin rashes Lymphatics: Denies new lymphadenopathy or easy bruising Neurological:Denies numbness, tingling or new weaknesses Behavioral/Psych: Mood is stable, no new changes  All other systems were reviewed  with the patient and are negative.   MEDICAL HISTORY:  Past Medical History:  Diagnosis Date   Allergy    Anxiety 2015   Result of culmination of super stressful caregiving and deaths of 2 immediate family members.  Overall do pretty well.   Depression    GERD (gastroesophageal reflux disease)    Right carpal tunnel syndrome 09/19/2019   Thyroid disease     SURGICAL HISTORY: Past Surgical History:  Procedure Laterality Date   TONSILLECTOMY      SOCIAL HISTORY: Social History   Socioeconomic History   Marital status: Married    Spouse name: Not on file   Number of children: 1   Years of education: Not on file   Highest education level: Not on file  Occupational History   Not on file  Tobacco Use   Smoking status: Never   Smokeless tobacco: Never  Vaping Use   Vaping Use: Never used  Substance and Sexual Activity   Alcohol use: No   Drug use: No   Sexual activity: Not Currently    Birth control/protection: Post-menopausal  Other Topics Concern   Not on file  Social History Narrative   Not on file   Social Determinants of Health   Financial Resource Strain: Not on file  Food Insecurity: Not on file  Transportation Needs: Not on file  Physical Activity: Not on file  Stress: Not on file  Social Connections: Not on file  Intimate Partner Violence: Not on file    FAMILY HISTORY: Family History  Problem Relation Age of Onset   Hyperlipidemia Father    Diabetes Maternal Grandmother     ALLERGIES:  is allergic to nortriptyline, omeprazole, singulair [montelukast sodium], and sulfonamide derivatives.  MEDICATIONS:  Current Outpatient Medications  Medication Sig Dispense Refill   Calcium Carb-Cholecalciferol (CALCIUM-VITAMIN D3) 250-125  MG-UNIT TABS Take 1 tablet once a day     Cholecalciferol (VITAMIN D3) 1000 units CAPS Take 1,000 Units by mouth daily.     EPINEPHrine 0.3 mg/0.3 mL IJ SOAJ injection Inject 0.3 mg into the muscle as needed for anaphylaxis.  2 each 1   hydrocortisone 2.5 % ointment   1   sertraline (ZOLOFT) 50 MG tablet Take 50 mg by mouth daily.     TIROSINT 100 MCG CAPS Take by mouth.     TIROSINT 88 MCG CAPS   10   No current facility-administered medications for this visit.    PHYSICAL EXAMINATION: ECOG PERFORMANCE STATUS: 0 - Asymptomatic  Vitals:   12/25/21 1257  BP: (!) 140/85  Pulse: (!) 106  Resp: 18  Temp: 98.1 F (36.7 C)  SpO2: 98%   Filed Weights   12/25/21 1257  Weight: 169 lb 12.8 oz (77 kg)    GENERAL:alert, no distress and comfortable SKIN: skin color, texture, turgor are normal, no rashes or significant lesions EYES: normal, Conjunctiva are pink and non-injected, sclera clear  NECK: supple, thyroid normal size, non-tender, without nodularity LYMPH:  no palpable lymphadenopathy in the cervical, axillary  LUNGS: clear to auscultation and percussion with normal breathing effort HEART: regular rate & rhythm and no murmurs and no lower extremity edema ABDOMEN:abdomen soft, non-tender and normal bowel sounds Musculoskeletal:no cyanosis of digits and no clubbing  NEURO: alert & oriented x 3 with fluent speech, no focal motor/sensory deficits BREAST: No palpable mass, nodules or adenopathy bilaterally. Breast exam benign.  LABORATORY DATA:  I have reviewed the data as listed    Latest Ref Rng & Units 12/25/2021   12:28 PM 03/09/2013   11:27 PM 06/16/2009    6:47 AM  CBC  WBC 4.0 - 10.5 K/uL 11.4  10.7  8.4   Hemoglobin 12.0 - 15.0 g/dL 15.0  14.4  13.6   Hematocrit 36.0 - 46.0 % 45.8  42.7  42.4   Platelets 150 - 400 K/uL 306  310  284        Latest Ref Rng & Units 12/25/2021   12:28 PM 03/09/2013   11:27 PM 06/16/2009    6:47 AM  CMP  Glucose 70 - 99 mg/dL 156  113  115   BUN 8 - 23 mg/dL _0 Creatinine 0.44 - 1.00 mg/dL 0.66  0.62  0.82   Sodium 135 - 145 mmol/L 137  140  139   Potassium 3.5 - 5.1 mmol/L 3.6  3.5  3.8   Chloride 98 - 111 mmol/L 104  102  107   CO2 22 - 32  mmol/L _1 Calcium 8.9 - 10.3 mg/dL 10.1  9.6  9.1   Total Protein 6.5 - 8.1 g/dL 8.1     Total Bilirubin 0.3 - 1.2 mg/dL 0.5     Alkaline Phos 38 - 126 U/L 104     AST 15 - 41 U/L 28     ALT 0 - 44 U/L 44        RADIOGRAPHIC STUDIES: I have personally reviewed the radiological images as listed and agreed with the findings in the report. [place] AXILLA RIGHT  Result Date: 12/23/2021 CLINICAL DATA:  61 year old female with newly diagnosed RIGHT breast cancer. Evaluate RIGHT axilla. EXAM: ULTRASOUND OF THE RIGHT AXILLA COMPARISON:  None available. FINDINGS: Ultrasound is performed, showing no abnormal appearing RIGHT axillary lymph nodes. IMPRESSION: No abnormal appearing RIGHT  axillary lymph nodes. RECOMMENDATION: Treatment plan for RIGHT breast cancer. I have discussed the findings and recommendations with the patient. If applicable, a reminder letter will be sent to the patient regarding the next appointment. BI-RADS CATEGORY  1: Negative. Electronically Signed   By: Margarette Canada M.D.   On: 12/23/2021 14:59  MM RT BREAST BX W LOC DEV 1ST LESION IMAGE BX SPEC STEREO GUIDE  Addendum Date: 12/23/2021   ADDENDUM REPORT: 12/23/2021 08:59 ADDENDUM: Pathology revealed GRADE III DUCTAL CARCINOMA IN SITU, SOLID AND CRIBRIFORM TYPE WITH COMEDONECROSIS AND ASSOCIATED CALCIFICATIONS of the RIGHT breast, upper outer quadrant, (x clip). FOCAL MICROINVASION IS PRESENT (LESS THAN 1 MM) WITH EVIDENCE OF LYMPHOVASCULAR INVASION . This was found to be concordant by Dr. Nolon Nations. Pathology results were discussed with the patient by telephone. The patient reported doing well after the biopsy with tenderness at the site. Post biopsy instructions and care were reviewed and questions were answered. The patient was encouraged to call [facility] for any additional concerns. My direct phone number was provided. The patient was referred to [facility] on December 25, 2021. The patient is scheduled for a RIGHT axillary ultrasound on December 23, 2021 due to the focal microinvasion. Pathology results reported by Terie Purser, RN on 12/19/2021. Electronically Signed   By: Nolon Nations M.D.   On: 12/23/2021 08:59   Result Date: 12/23/2021 CLINICAL DATA:  Patient presents for stereotactic guided core biopsy of RIGHT breast calcifications. EXAM: RIGHT BREAST STEREOTACTIC CORE NEEDLE BIOPSY COMPARISON:  Previous exam(s). FINDINGS: The patient and I discussed the procedure of stereotactic-guided biopsy including benefits and alternatives. We discussed the high likelihood of a successful procedure. We discussed the risks of the procedure including infection, bleeding, tissue injury, clip migration, and inadequate sampling. Informed written consent was given. The usual time out protocol was performed immediately prior to the procedure. Using sterile technique and lidocaine and lidocaine with epinephrine as local anesthetic, under stereotactic guidance, a 9 gauge vacuum assisted device was used to perform core needle biopsy of calcifications in the UPPER-OUTER QUADRANT of the RIGHT breast using a LATERAL approach. Specimen radiograph was performed showing calcifications in numerous tissue samples. Specimens with calcifications are identified for pathology. Lesion quadrant: UPPER-OUTER QUADRANT RIGHT breast At the conclusion of the procedure, X shaped tissue marker clip was deployed into the biopsy cavity. Follow-up 2-view mammogram was performed and dictated separately. IMPRESSION: Stereotactic-guided biopsy of RIGHT breast calcifications. No apparent complications. Electronically Signed: By: Nolon Nations M.D. On: 12/18/2021 09:11  MM CLIP PLACEMENT RIGHT  Result Date: 12/18/2021 CLINICAL DATA:  Status post stereotactic biopsy of RIGHT breast calcifications. EXAM: 3D DIAGNOSTIC RIGHT MAMMOGRAM POST STEREOTACTIC  BIOPSY COMPARISON:  Previous exam(s). FINDINGS: 3D Mammographic images were obtained following stereotactic guided biopsy of calcifications in the UPPER-OUTER QUADRANT of the RIGHT breast and placement of an X shaped clip. The biopsy marking clip is 5 millimeters MEDIAL to the residual calcifications. IMPRESSION: The tissue marker x clip is 5 millimeters MEDIAL to residual calcifications. Final Assessment: Post Procedure Mammograms for Marker Placement Electronically Signed   By: Nolon Nations M.D.   On: 12/18/2021 09:21  MM Digital Diagnostic Unilat R  Result Date: 12/06/2021 CLINICAL DATA:  Patient returns today to evaluate RIGHT breast calcifications identified on a recent screening mammogram. EXAM: DIGITAL DIAGNOSTIC UNILATERAL RIGHT MAMMOGRAM TECHNIQUE: Right digital diagnostic mammography was performed. COMPARISON:  Previous exam(s). ACR Breast Density  Category b: There are scattered areas of fibroglandular density. FINDINGS: On today's additional diagnostic views, including magnification views, grouped coarse heterogeneous calcifications are confirmed within the upper-outer quadrant of the RIGHT breast, measuring 6 mm extent. IMPRESSION: Grouped coarse heterogeneous calcifications within the upper-outer quadrant of the RIGHT breast, measuring 6 mm extent. These may be fibroadenomatous calcifications. Stereotactic biopsy is recommended to exclude malignancy. RECOMMENDATION: Stereotactic biopsy for the RIGHT breast calcifications. Stereotactic biopsy is scheduled on September 6th. I have discussed the findings and recommendations with the patient. If applicable, a reminder letter will be sent to the patient regarding the next appointment. BI-RADS CATEGORY  4: Suspicious. Electronically Signed   By: Franki Cabot M.D.   On: 12/06/2021 11:10    No orders of the defined types were placed in this encounter.   All questions were answered. The patient knows to call the clinic with any problems, questions  or concerns. The total time spent in the appointment was 45 minutes.     Truitt Merle, MD 12/25/2021   I, Wilburn Mylar, am acting as scribe for Truitt Merle, MD.   I have reviewed the above documentation for accuracy and completeness, and I agree with the above.

## 2021-12-25 NOTE — Progress Notes (Signed)
[place] Psychosocial Distress Screening Spiritual Care  Met with Samantha [name] and her husband in [facility] to introduce [place] team/resources, reviewing distress screen per protocol.  The patient scored a 6 on the Psychosocial Distress Thermometer which indicates moderate distress. Also assessed for distress and other psychosocial needs.      12/25/2021    4:13 PM  ONCBCN DISTRESS SCREENING  Distress experienced in past week (1-10) 6  Emotional problem type Nervousness/Anxiety;Adjusting to illness  Information Concerns Type Lack of info about diagnosis;Lack of info about treatment  Physical Problem type Constipation/diarrhea  Referral to support programs Yes   Patient leaned on her faith this week when facing the anxiety of the diagnosis. Patient reported she is close with her sister, son, sister in law, and granddaughter. Patient reported feeling the weight of anxiety she has been carrying this last week lifted after getting her questions answered today in clinic. The patient reported she could not eat this week because of anxiety but during the appointment this afternoon her appetite returned.   Follow up needed: No.  Lysle Morales  Counseling Intern

## 2021-12-25 NOTE — Progress Notes (Signed)
REFERRING PROVIDER: Truitt Merle, MD [place], [place] [address]  PRIMARY PROVIDER:  Crist Infante, MD  PRIMARY REASON FOR VISIT:  Encounter Diagnosis  Name Primary?   Malignant neoplasm of upper-outer quadrant of right breast in female, estrogen receptor negative ([place]) Yes   HISTORY OF PRESENT ILLNESS:   Samantha [name], a 61 y.o. female, was seen for a [place] cancer genetics consultation during the breast multidisciplinary clinic at the request of Dr. Burr Medico due to a personal history of cancer.  Samantha [name] presents to clinic today to discuss the possibility of a hereditary predisposition to cancer, to discuss genetic testing, and to further clarify her future cancer risks, as well as potential cancer risks for family members.   In September 2023, at the age of 66, Samantha [name] was diagnosed with ductal carcinoma in situ of the left breast (ER/PR/HER Negative).  CANCER HISTORY:  Oncology History   No history exists.    RISK FACTORS:  Menarche was at age 85-13.  First live birth at age 40.  OCP use for approximately  ~29  years.  Ovaries intact: yes.  Uterus intact: yes.  Menopausal status: postmenopausal.  HRT use: 4 years. Colonoscopy: no Mammogram within the last year: yes. Any excessive radiation exposure in the past: no  Past Medical History:  Diagnosis Date   Allergy    Depression    GERD (gastroesophageal reflux disease)    Right carpal tunnel syndrome 09/19/2019   Thyroid disease     Past Surgical History:  Procedure Laterality Date   TONSILLECTOMY      Social History   Socioeconomic History   Marital status: Married    Spouse name: Not on file   Number of children: Not on file   Years of education: Not on file   Highest education level: Not on file  Occupational History   Not on file  Tobacco Use   Smoking status: Never   Smokeless tobacco: Never  Vaping Use   Vaping Use: Never used  Substance and Sexual Activity   Alcohol use: No    Drug use: No   Sexual activity: Not on file  Other Topics Concern   Not on file  Social History Narrative   Not on file   Social Determinants of Health   Financial Resource Strain: Not on file  Food Insecurity: Not on file  Transportation Needs: Not on file  Physical Activity: Not on file  Stress: Not on file  Social Connections: Not on file     FAMILY HISTORY:  We obtained a detailed, 4-generation family history.  Significant diagnoses are listed below:  Samantha [name] is unaware of previous family history of cancer or genetic testing for hereditary cancer risks. There is no reported Ashkenazi Jewish ancestry.     GENETIC COUNSELING ASSESSMENT: Samantha [name] is a 61 y.o. female with a personal history of cancer which is somewhat suggestive of a hereditary cancer syndrome and predisposition to cancer given her breast cancer is triple negative. We, therefore, discussed and recommended the following at today's visit.   DISCUSSION: We discussed that 5 - 10% of cancer is hereditary, with most cases of hereditary breast cancer associated with mutations in BRCA1/2.  There are other genes that can be associated with hereditary breast cancer syndromes. Type of cancer risk and level of risk are gene-specific. We discussed that testing is beneficial for several reasons including knowing how to follow individuals after completing their treatment, identifying whether potential treatment options would  be beneficial, and understanding if other family members could be at risk for cancer and allowing them to undergo genetic testing.   We reviewed the characteristics, features and inheritance patterns of hereditary cancer syndromes. We also discussed genetic testing, including the appropriate family members to test, the process of testing, insurance coverage and turn-around-time for results. We discussed the implications of a negative, positive and/or variant of uncertain significant result.   Based on Ms.  [name]'s personal history of cancer, she meets medical criteria for genetic testing. Despite that she meets criteria, she may still have an out of pocket cost. If her out of pocket cost for testing is over $100, the laboratory should contact them to discuss self-pay prices, patient pay assistance programs, if applicable, and other billing options.   PLAN: Despite our recommendation, Samantha [name] did not wish to pursue genetic testing at today's visit. We understand this decision and remain available to coordinate genetic testing at any time in the future. We, therefore, recommend Samantha [name] continue to follow the cancer screening guidelines given by her healthcare providers.  Samantha [name] questions were answered to her satisfaction today. Our contact information was provided should additional questions or concerns arise. Thank you for the referral and allowing [place] to share in the care of your patient.   Lucille Passy, MS, [facility] Genetic Counselor [email or web address] (P) [phone]  The patient was seen for a total of 20 minutes in face-to-face genetic counseling.  The patient brought her husband.  Drs. Lindi Adie and/or Burr Medico were available to discuss this case as needed.  _______________________________________________________________________ For Office Staff:  Number of people involved in session: 2 Was an Intern/ student involved with case: no

## 2021-12-26 ENCOUNTER — Other Ambulatory Visit: Payer: Self-pay | Admitting: *Deleted

## 2021-12-26 ENCOUNTER — Other Ambulatory Visit: Payer: Self-pay | Admitting: General Surgery

## 2021-12-26 DIAGNOSIS — C50411 Malignant neoplasm of upper-outer quadrant of right female breast: Secondary | ICD-10-CM

## 2021-12-26 LAB — GENETIC SCREENING ORDER

## 2021-12-27 ENCOUNTER — Encounter: Payer: Self-pay | Admitting: *Deleted

## 2021-12-29 ENCOUNTER — Encounter: Payer: Self-pay | Admitting: Hematology

## 2022-01-02 ENCOUNTER — Encounter: Payer: Self-pay | Admitting: *Deleted

## 2022-01-02 ENCOUNTER — Telehealth: Payer: Self-pay | Admitting: *Deleted

## 2022-01-02 NOTE — Telephone Encounter (Signed)
Spoke with patient to follow up from [facility] 9/13 and assess navigation needs. Patient denies any questions or concerns at this time. Encouraged her to call should anything arise. Patient verbalized understanding.

## 2022-01-02 NOTE — Pre-Procedure Instructions (Signed)
Surgical Instructions    Your procedure is scheduled on January 08, 2022.  Report to [facility] Main Entrance "A" at 7:00 A.M., then check in with the Admitting office.  Call this number if you have problems the morning of surgery:  [phone]   If you have any questions prior to your surgery date call [phone]: Open Monday-Friday 8am-4pm    Remember:  Do not eat after midnight the night before your surgery  You may drink clear liquids until 6:00 AM the morning of your surgery.   Clear liquids allowed are: Water, Non-Citrus Juices (without pulp), Carbonated Beverages, Clear Tea, Black Coffee Only (NO MILK, CREAM OR POWDERED CREAMER of any kind), and Gatorade.     Take these medicines the morning of surgery with A SIP OF WATER:  sertraline (ZOLOFT)  TIROSINT   EPINEPHrine - may take as needed   As of today, STOP taking any Aspirin (unless otherwise instructed by your surgeon) Aleve, Naproxen, Ibuprofen, Motrin, Advil, Goody's, BC's, all herbal medications, fish oil, and all vitamins.                     Do NOT Smoke (Tobacco/Vaping) for 24 hours prior to your procedure.  If you use a CPAP at night, you may bring your mask/headgear for your overnight stay.   Contacts, glasses, piercing's, hearing aid's, dentures or partials may not be worn into surgery, please bring cases for these belongings.    For patients admitted to the hospital, discharge time will be determined by your treatment team.   Patients discharged the day of surgery will not be allowed to drive home, and someone needs to stay with them for 24 hours.  SURGICAL WAITING ROOM VISITATION Patients having surgery or a procedure may have no more than 2 support people in the waiting area - these visitors may rotate.   Children under the age of 89 must have an adult with them who is not the patient. If the patient needs to stay at the hospital during part of their recovery, the visitor guidelines for inpatient  rooms apply. Pre-op nurse will coordinate an appropriate time for 1 support person to accompany patient in pre-op.  This support person may not rotate.   Please refer to the [facility] website for the visitor guidelines for Inpatients (after your surgery is over and you are in a regular room).    Special instructions:   [place]- Preparing For Surgery  Before surgery, you can play an important role. Because skin is not sterile, your skin needs to be as free of germs as possible. You can reduce the number of germs on your skin by washing with CHG (chlorahexidine gluconate) Soap before surgery.  CHG is an antiseptic cleaner which kills germs and bonds with the skin to continue killing germs even after washing.    Oral Hygiene is also important to reduce your risk of infection.  Remember - BRUSH YOUR TEETH THE MORNING OF SURGERY WITH YOUR REGULAR TOOTHPASTE  Please do not use if you have an allergy to CHG or antibacterial soaps. If your skin becomes reddened/irritated stop using the CHG.  Do not shave (including legs and underarms) for at least 48 hours prior to first CHG shower. It is OK to shave your face.  Please follow these instructions carefully.   Shower the NIGHT BEFORE SURGERY and the MORNING OF SURGERY  If you chose to wash your hair, wash your hair first as usual with your normal shampoo.  After you shampoo, rinse your hair and body thoroughly to remove the shampoo.  Use CHG Soap as you would any other liquid soap. You can apply CHG directly to the skin and wash gently with a scrungie or a clean washcloth.   Apply the CHG Soap to your body ONLY FROM THE NECK DOWN.  Do not use on open wounds or open sores. Avoid contact with your eyes, ears, mouth and genitals (private parts). Wash Face and genitals (private parts)  with your normal soap.   Wash thoroughly, paying special attention to the area where your surgery will be performed.  Thoroughly rinse your body with warm water  from the neck down.  DO NOT shower/wash with your normal soap after using and rinsing off the CHG Soap.  Pat yourself dry with a CLEAN TOWEL.  Wear CLEAN PAJAMAS to bed the night before surgery  Place CLEAN SHEETS on your bed the night before your surgery  DO NOT SLEEP WITH PETS.   Day of Surgery: Take a shower with CHG soap. Do not wear jewelry or makeup Do not wear lotions, powders, perfumes/colognes, or deodorant. Do not shave 48 hours prior to surgery.  Men may shave face and neck. Do not bring valuables to the hospital.  [facility] is not responsible for any belongings or valuables. Do not wear nail polish, gel polish, artificial nails, or any other type of covering on natural nails (fingers and toes) If you have artificial nails or gel coating that need to be removed by a nail salon, please have this removed prior to surgery. Artificial nails or gel coating may interfere with anesthesia's ability to adequately monitor your vital signs.  Wear Clean/Comfortable clothing the morning of surgery Remember to brush your teeth WITH YOUR REGULAR TOOTHPASTE.   Please read over the following fact sheets that you were given.    If you received a COVID test during your pre-op visit  it is requested that you wear a mask when out in public, stay away from anyone that may not be feeling well and notify your surgeon if you develop symptoms. If you have been in contact with anyone that has tested positive in the last 10 days please notify you surgeon.

## 2022-01-03 ENCOUNTER — Encounter (HOSPITAL_COMMUNITY): Payer: Self-pay

## 2022-01-03 ENCOUNTER — Encounter (HOSPITAL_COMMUNITY)
Admission: RE | Admit: 2022-01-03 | Discharge: 2022-01-03 | Disposition: A | Discharge status: Home / Self Care | Payer: Managed Care, Other (non HMO) | Source: Ambulatory Visit | Attending: General Surgery | Admitting: General Surgery

## 2022-01-03 ENCOUNTER — Other Ambulatory Visit: Payer: Self-pay

## 2022-01-03 DIAGNOSIS — Z01818 Encounter for other preprocedural examination: Secondary | ICD-10-CM | POA: Diagnosis not present

## 2022-01-03 HISTORY — DX: Hypothyroidism, unspecified: E03.9

## 2022-01-03 NOTE — Progress Notes (Signed)
PCP - Dr. Crist Infante Cardiologist - denies  PPM/ICD - denies   Chest x-ray - 03/10/13 EKG - 03/09/13 (noted NSR in notes from ED) Stress Test - denies ECHO - denies Cardiac Cath - denies  Sleep Study - denies   DM- denies  ASA/Blood Thinner Instructions: n/a   ERAS Protcol - yes, no drink   COVID TEST- n/a   Anesthesia review: yes, breast seed  Patient denies shortness of breath, fever, cough and chest pain at PAT appointment   All instructions explained to the patient, with a verbal understanding of the material. Patient agrees to go over the instructions while at home for a better understanding. The opportunity to ask questions was provided.

## 2022-01-03 NOTE — Pre-Procedure Instructions (Signed)
Surgical Instructions    Your procedure is scheduled on January 08, 2022.  Report to [facility] Main Entrance "A" at 7:00 A.M., then check in with the Admitting office.  Call this number if you have problems the morning of surgery:  [phone]   If you have any questions prior to your surgery date call [phone]: Open Monday-Friday 8am-4pm    Remember:  Do not eat after midnight the night before your surgery  You may drink clear liquids until 6:00 AM the morning of your surgery.   Clear liquids allowed are: Water, Non-Citrus Juices (without pulp), Carbonated Beverages, Clear Tea, Black Coffee Only (NO MILK, CREAM OR POWDERED CREAMER of any kind), and Gatorade.     Take these medicines the morning of surgery with A SIP OF WATER:  sertraline (ZOLOFT)  TIROSINT   EPINEPHrine - may take as needed acetaminophen (TYLENOL)- if needed  As of today, STOP taking any Aspirin (unless otherwise instructed by your surgeon) Aleve, Naproxen, Ibuprofen, Motrin, Advil, Goody's, BC's, all herbal medications, fish oil, and all vitamins.                     Do NOT Smoke (Tobacco/Vaping) for 24 hours prior to your procedure.  If you use a CPAP at night, you may bring your mask/headgear for your overnight stay.   Contacts, glasses, piercing's, hearing aid's, dentures or partials may not be worn into surgery, please bring cases for these belongings.    For patients admitted to the hospital, discharge time will be determined by your treatment team.   Patients discharged the day of surgery will not be allowed to drive home, and someone needs to stay with them for 24 hours.  SURGICAL WAITING ROOM VISITATION Patients having surgery or a procedure may have no more than 2 support people in the waiting area - these visitors may rotate.   Children under the age of 57 must have an adult with them who is not the patient. If the patient needs to stay at the hospital during part of their recovery, the  visitor guidelines for inpatient rooms apply. Pre-op nurse will coordinate an appropriate time for 1 support person to accompany patient in pre-op.  This support person may not rotate.   Please refer to the [facility] website for the visitor guidelines for Inpatients (after your surgery is over and you are in a regular room).    Special instructions:   [place]- Preparing For Surgery  Before surgery, you can play an important role. Because skin is not sterile, your skin needs to be as free of germs as possible. You can reduce the number of germs on your skin by washing with CHG (chlorahexidine gluconate) Soap before surgery.  CHG is an antiseptic cleaner which kills germs and bonds with the skin to continue killing germs even after washing.    Oral Hygiene is also important to reduce your risk of infection.  Remember - BRUSH YOUR TEETH THE MORNING OF SURGERY WITH YOUR REGULAR TOOTHPASTE  Please do not use if you have an allergy to CHG or antibacterial soaps. If your skin becomes reddened/irritated stop using the CHG.  Do not shave (including legs and underarms) for at least 48 hours prior to first CHG shower. It is OK to shave your face.  Please follow these instructions carefully.   Shower the NIGHT BEFORE SURGERY and the MORNING OF SURGERY  If you chose to wash your hair, wash your hair first as usual with  your normal shampoo.  After you shampoo, rinse your hair and body thoroughly to remove the shampoo.  Use CHG Soap as you would any other liquid soap. You can apply CHG directly to the skin and wash gently with a scrungie or a clean washcloth.   Apply the CHG Soap to your body ONLY FROM THE NECK DOWN.  Do not use on open wounds or open sores. Avoid contact with your eyes, ears, mouth and genitals (private parts). Wash Face and genitals (private parts)  with your normal soap.   Wash thoroughly, paying special attention to the area where your surgery will be performed.  Thoroughly  rinse your body with warm water from the neck down.  DO NOT shower/wash with your normal soap after using and rinsing off the CHG Soap.  Pat yourself dry with a CLEAN TOWEL.  Wear CLEAN PAJAMAS to bed the night before surgery  Place CLEAN SHEETS on your bed the night before your surgery  DO NOT SLEEP WITH PETS.   Day of Surgery: Take a shower with CHG soap. Do not wear jewelry or makeup Do not wear lotions, powders, perfumes/colognes, or deodorant. Do not shave 48 hours prior to surgery.  Men may shave face and neck. Do not bring valuables to the hospital.  [facility] is not responsible for any belongings or valuables. Do not wear nail polish, gel polish, artificial nails, or any other type of covering on natural nails (fingers and toes) If you have artificial nails or gel coating that need to be removed by a nail salon, please have this removed prior to surgery. Artificial nails or gel coating may interfere with anesthesia's ability to adequately monitor your vital signs.  Wear Clean/Comfortable clothing the morning of surgery Remember to brush your teeth WITH YOUR REGULAR TOOTHPASTE.   Please read over the following fact sheets that you were given.    If you received a COVID test during your pre-op visit  it is requested that you wear a mask when out in public, stay away from anyone that may not be feeling well and notify your surgeon if you develop symptoms. If you have been in contact with anyone that has tested positive in the last 10 days please notify you surgeon.

## 2022-01-07 ENCOUNTER — Ambulatory Visit: Payer: Managed Care, Other (non HMO) | Admitting: Sports Medicine

## 2022-01-07 ENCOUNTER — Ambulatory Visit
Admission: RE | Admit: 2022-01-07 | Discharge: 2022-01-07 | Disposition: A | Discharge status: Home / Self Care | Payer: Managed Care, Other (non HMO) | Source: Ambulatory Visit | Attending: General Surgery | Admitting: General Surgery

## 2022-01-07 DIAGNOSIS — C50411 Malignant neoplasm of upper-outer quadrant of right female breast: Secondary | ICD-10-CM

## 2022-01-08 ENCOUNTER — Ambulatory Visit (HOSPITAL_COMMUNITY)
Admission: RE | Admit: 2022-01-08 | Discharge: 2022-01-08 | Disposition: A | Discharge status: Home / Self Care | Payer: Managed Care, Other (non HMO) | Attending: General Surgery | Admitting: General Surgery

## 2022-01-08 ENCOUNTER — Other Ambulatory Visit: Payer: Self-pay

## 2022-01-08 ENCOUNTER — Encounter (HOSPITAL_COMMUNITY)
Admission: RE | Disposition: A | Discharge status: Home / Self Care | Payer: Self-pay | Source: Home / Self Care | Attending: General Surgery

## 2022-01-08 ENCOUNTER — Ambulatory Visit (HOSPITAL_BASED_OUTPATIENT_CLINIC_OR_DEPARTMENT_OTHER): Payer: Managed Care, Other (non HMO) | Admitting: Anesthesiology

## 2022-01-08 ENCOUNTER — Ambulatory Visit
Admission: RE | Admit: 2022-01-08 | Discharge: 2022-01-08 | Disposition: A | Discharge status: Home / Self Care | Payer: Managed Care, Other (non HMO) | Source: Ambulatory Visit | Attending: General Surgery | Admitting: General Surgery

## 2022-01-08 ENCOUNTER — Ambulatory Visit (HOSPITAL_COMMUNITY): Payer: Managed Care, Other (non HMO) | Admitting: Physician Assistant

## 2022-01-08 ENCOUNTER — Encounter (HOSPITAL_COMMUNITY): Payer: Self-pay | Admitting: General Surgery

## 2022-01-08 DIAGNOSIS — K219 Gastro-esophageal reflux disease without esophagitis: Secondary | ICD-10-CM | POA: Insufficient documentation

## 2022-01-08 DIAGNOSIS — D0511 Intraductal carcinoma in situ of right breast: Secondary | ICD-10-CM | POA: Insufficient documentation

## 2022-01-08 DIAGNOSIS — M1990 Unspecified osteoarthritis, unspecified site: Secondary | ICD-10-CM | POA: Insufficient documentation

## 2022-01-08 DIAGNOSIS — M5116 Intervertebral disc disorders with radiculopathy, lumbar region: Secondary | ICD-10-CM | POA: Insufficient documentation

## 2022-01-08 DIAGNOSIS — E039 Hypothyroidism, unspecified: Secondary | ICD-10-CM | POA: Insufficient documentation

## 2022-01-08 DIAGNOSIS — C50911 Malignant neoplasm of unspecified site of right female breast: Secondary | ICD-10-CM

## 2022-01-08 DIAGNOSIS — Z171 Estrogen receptor negative status [ER-]: Secondary | ICD-10-CM

## 2022-01-08 DIAGNOSIS — F418 Other specified anxiety disorders: Secondary | ICD-10-CM | POA: Diagnosis not present

## 2022-01-08 DIAGNOSIS — G709 Myoneural disorder, unspecified: Secondary | ICD-10-CM | POA: Insufficient documentation

## 2022-01-08 HISTORY — PX: BREAST LUMPECTOMY WITH RADIOACTIVE SEED LOCALIZATION: SHX6424

## 2022-01-08 SURGERY — BREAST LUMPECTOMY WITH RADIOACTIVE SEED LOCALIZATION
Anesthesia: General | Site: Breast | Laterality: Right

## 2022-01-08 MED ORDER — MIDAZOLAM HCL 5 MG/5ML IJ SOLN
INTRAMUSCULAR | Status: DC | PRN
  Administered 2022-01-08: 2 mg via INTRAVENOUS

## 2022-01-08 MED ORDER — FENTANYL CITRATE (PF) 100 MCG/2ML IJ SOLN: 25.0000 ug | INTRAMUSCULAR | Status: DC | PRN

## 2022-01-08 MED ORDER — PROPOFOL 10 MG/ML IV BOLUS
INTRAVENOUS | Status: DC | PRN
  Administered 2022-01-08: 160 mg via INTRAVENOUS

## 2022-01-08 MED ORDER — ORAL CARE MOUTH RINSE: 15.0000 mL | Freq: Once | OROMUCOSAL | Status: AC

## 2022-01-08 MED ORDER — FENTANYL CITRATE (PF) 250 MCG/5ML IJ SOLN
INTRAMUSCULAR | Status: AC
  Filled 2022-01-08: qty 5

## 2022-01-08 MED ORDER — DEXAMETHASONE SODIUM PHOSPHATE 10 MG/ML IJ SOLN
INTRAMUSCULAR | Status: DC | PRN
  Administered 2022-01-08: 10 mg via INTRAVENOUS

## 2022-01-08 MED ORDER — LIDOCAINE 2% (20 MG/ML) 5 ML SYRINGE
INTRAMUSCULAR | Status: AC
  Filled 2022-01-08: qty 5

## 2022-01-08 MED ORDER — LIDOCAINE 2% (20 MG/ML) 5 ML SYRINGE
INTRAMUSCULAR | Status: DC | PRN
  Administered 2022-01-08: 60 mg via INTRAVENOUS

## 2022-01-08 MED ORDER — LACTATED RINGERS IV SOLN: INTRAVENOUS | Status: DC

## 2022-01-08 MED ORDER — BUPIVACAINE-EPINEPHRINE (PF) 0.25% -1:200000 IJ SOLN
INTRAMUSCULAR | Status: AC
  Filled 2022-01-08: qty 30

## 2022-01-08 MED ORDER — LIDOCAINE HCL (PF) 1 % IJ SOLN
INTRAMUSCULAR | Status: AC
  Filled 2022-01-08: qty 30

## 2022-01-08 MED ORDER — MIDAZOLAM HCL 2 MG/2ML IJ SOLN
INTRAMUSCULAR | Status: AC
  Filled 2022-01-08: qty 2

## 2022-01-08 MED ORDER — ACETAMINOPHEN 500 MG PO TABS
1000.0000 mg | ORAL_TABLET | ORAL | Status: AC
  Administered 2022-01-08: 500 mg via ORAL
  Filled 2022-01-08: qty 2

## 2022-01-08 MED ORDER — DEXAMETHASONE SODIUM PHOSPHATE 10 MG/ML IJ SOLN
INTRAMUSCULAR | Status: AC
  Filled 2022-01-08: qty 1

## 2022-01-08 MED ORDER — CHLORHEXIDINE GLUCONATE CLOTH 2 % EX PADS: 6.0000 | MEDICATED_PAD | Freq: Once | CUTANEOUS | Status: DC

## 2022-01-08 MED ORDER — OXYCODONE HCL 5 MG PO TABS: 5.0000 mg | ORAL_TABLET | Freq: Four times a day (QID) | ORAL | 0 refills | Status: DC | PRN

## 2022-01-08 MED ORDER — OXYCODONE HCL 5 MG PO TABS: 5.0000 mg | ORAL_TABLET | Freq: Once | ORAL | Status: DC | PRN

## 2022-01-08 MED ORDER — ONDANSETRON HCL 4 MG/2ML IJ SOLN
INTRAMUSCULAR | Status: DC | PRN
  Administered 2022-01-08: 4 mg via INTRAVENOUS

## 2022-01-08 MED ORDER — LIDOCAINE HCL 1 % IJ SOLN
INTRAMUSCULAR | Status: DC | PRN
  Administered 2022-01-08: 40 mL via INTRAMUSCULAR

## 2022-01-08 MED ORDER — SCOPOLAMINE 1 MG/3DAYS TD PT72
1.0000 | MEDICATED_PATCH | TRANSDERMAL | Status: DC
  Administered 2022-01-08: 1.5 mg via TRANSDERMAL
  Filled 2022-01-08: qty 1

## 2022-01-08 MED ORDER — OXYCODONE HCL 5 MG/5ML PO SOLN: 5.0000 mg | Freq: Once | ORAL | Status: DC | PRN

## 2022-01-08 MED ORDER — 0.9 % SODIUM CHLORIDE (POUR BTL) OPTIME
TOPICAL | Status: DC | PRN
  Administered 2022-01-08: 1000 mL

## 2022-01-08 MED ORDER — FENTANYL CITRATE (PF) 100 MCG/2ML IJ SOLN
INTRAMUSCULAR | Status: DC | PRN
  Administered 2022-01-08 (×4): 25 ug via INTRAVENOUS

## 2022-01-08 MED ORDER — CEFAZOLIN SODIUM-DEXTROSE 2-4 GM/100ML-% IV SOLN
2.0000 g | INTRAVENOUS | Status: AC
  Administered 2022-01-08: 2 g via INTRAVENOUS
  Filled 2022-01-08: qty 100

## 2022-01-08 MED ORDER — ONDANSETRON HCL 4 MG/2ML IJ SOLN
INTRAMUSCULAR | Status: AC
  Filled 2022-01-08: qty 2

## 2022-01-08 MED ORDER — ONDANSETRON HCL 4 MG/2ML IJ SOLN: 4.0000 mg | Freq: Once | INTRAMUSCULAR | Status: DC | PRN

## 2022-01-08 MED ORDER — CHLORHEXIDINE GLUCONATE 0.12 % MT SOLN
15.0000 mL | Freq: Once | OROMUCOSAL | Status: AC
  Administered 2022-01-08: 15 mL via OROMUCOSAL
  Filled 2022-01-08: qty 15

## 2022-01-08 SURGICAL SUPPLY — 43 items
ADH SKN CLS APL DERMABOND .7 (GAUZE/BANDAGES/DRESSINGS) ×1
APL PRP STRL LF DISP 70% ISPRP (MISCELLANEOUS) ×1
BAG COUNTER SPONGE SURGICOUNT (BAG) ×2 IMPLANT
BAG SPNG CNTER NS LX DISP (BAG) ×1
BINDER BREAST LRG (GAUZE/BANDAGES/DRESSINGS) IMPLANT
BINDER BREAST XLRG (GAUZE/BANDAGES/DRESSINGS) IMPLANT
BLADE SURG 10 STRL SS (BLADE) ×2 IMPLANT
CANISTER SUCT 3000ML PPV (MISCELLANEOUS) IMPLANT
CHLORAPREP W/TINT 26 (MISCELLANEOUS) ×2 IMPLANT
CLIP VESOCCLUDE LG 6/CT (CLIP) ×2 IMPLANT
COVER PROBE W GEL 5X96 (DRAPES) ×2 IMPLANT
COVER SURGICAL LIGHT HANDLE (MISCELLANEOUS) ×2 IMPLANT
DERMABOND ADVANCED .7 DNX12 (GAUZE/BANDAGES/DRESSINGS) ×2 IMPLANT
DEVICE DUBIN SPECIMEN MAMMOGRA (MISCELLANEOUS) ×2 IMPLANT
DRAPE CHEST BREAST 15X10 FENES (DRAPES) ×2 IMPLANT
ELECT COATED BLADE 2.86 ST (ELECTRODE) ×2 IMPLANT
ELECT REM PT RETURN 9FT ADLT (ELECTROSURGICAL) ×1
ELECTRODE REM PT RTRN 9FT ADLT (ELECTROSURGICAL) ×2 IMPLANT
GAUZE PAD ABD 8X10 STRL (GAUZE/BANDAGES/DRESSINGS) ×2 IMPLANT
GAUZE SPONGE 4X4 12PLY STRL (GAUZE/BANDAGES/DRESSINGS) IMPLANT
GAUZE SPONGE 4X4 12PLY STRL LF (GAUZE/BANDAGES/DRESSINGS) ×2 IMPLANT
GLOVE BIO SURGEON STRL SZ 6 (GLOVE) ×2 IMPLANT
GLOVE INDICATOR 6.5 STRL GRN (GLOVE) ×2 IMPLANT
GOWN STRL REUS W/ TWL LRG LVL3 (GOWN DISPOSABLE) ×2 IMPLANT
GOWN STRL REUS W/TWL 2XL LVL3 (GOWN DISPOSABLE) ×2 IMPLANT
GOWN STRL REUS W/TWL LRG LVL3 (GOWN DISPOSABLE) ×1
KIT BASIN OR (CUSTOM PROCEDURE TRAY) ×2 IMPLANT
KIT MARKER MARGIN INK (KITS) ×2 IMPLANT
LIGHT WAVEGUIDE WIDE FLAT (MISCELLANEOUS) IMPLANT
NDL HYPO 25GX1X1/2 BEV (NEEDLE) ×2 IMPLANT
NEEDLE HYPO 25GX1X1/2 BEV (NEEDLE) ×1 IMPLANT
NS IRRIG 1000ML POUR BTL (IV SOLUTION) IMPLANT
PACK GENERAL/GYN (CUSTOM PROCEDURE TRAY) ×2 IMPLANT
STRIP CLOSURE SKIN 1/2X4 (GAUZE/BANDAGES/DRESSINGS) ×2 IMPLANT
SUT MNCRL AB 4-0 PS2 18 (SUTURE) ×2 IMPLANT
SUT SILK 2 0 SH (SUTURE) IMPLANT
SUT VIC AB 2-0 SH 27 (SUTURE) ×1
SUT VIC AB 2-0 SH 27XBRD (SUTURE) ×2 IMPLANT
SUT VIC AB 3-0 SH 27 (SUTURE) ×1
SUT VIC AB 3-0 SH 27X BRD (SUTURE) ×2 IMPLANT
SYR CONTROL 10ML LL (SYRINGE) ×2 IMPLANT
TOWEL GREEN STERILE (TOWEL DISPOSABLE) ×2 IMPLANT
TOWEL GREEN STERILE FF (TOWEL DISPOSABLE) ×2 IMPLANT

## 2022-01-08 NOTE — Anesthesia Postprocedure Evaluation (Signed)
Anesthesia Post Note  Patient: Samantha [name]  Procedure(s) Performed: RIGHT BREAST LUMPECTOMY WITH RADIOACTIVE SEED LOCALIZATION (Right: Breast)     Patient location during evaluation: PACU Anesthesia Type: General Level of consciousness: awake and alert Pain management: pain level controlled Vital Signs Assessment: post-procedure vital signs reviewed and stable Respiratory status: spontaneous breathing, nonlabored ventilation and respiratory function stable Cardiovascular status: blood pressure returned to baseline and stable Postop Assessment: no apparent nausea or vomiting Anesthetic complications: no   No notable events documented.  Last Vitals:  Vitals:   01/08/22 1055 01/08/22 1110  BP: 120/71 128/76  Pulse: 91 95  Resp: 14 14  Temp:  36.8 C  SpO2: 96% 98%    Last Pain:  Vitals:   01/08/22 1110  TempSrc:   PainSc: 0-No pain                 Cherita Hebel A.

## 2022-01-08 NOTE — H&P (Signed)
REFERRING PHYSICIAN: Julien Girt  PROVIDER: Georgianne Fick, MD  Care Team: Patient Care Team: Jerlyn Ly, MD as PCP - General (Internal Medicine) Truitt Merle, MD (Hematology and Oncology) Marye Round, MD (Radiation Oncology) Lanney Gins, MD (Endocrinology) Georgianne Fick, MD as Consulting Provider (Surgical Oncology) Jake Samples, MD (Obstetrics and Gynecology)   MRN: [id number] DOB: [birth year] DATE OF ENCOUNTER: 12/25/2021  Subjective   Chief Complaint: Breast Cancer  History of Present Illness: Samantha [name] is a 61 y.o. female who is seen today as an office consultation at the request of Dr. Burr Medico for evaluation of Breast Cancer  Pt presents with a new diagnosis of right breast cancer 12/2021. She had screening detected calcifications. Diagnostic imaging confirmed this. It showed 6 mm of calcs in the UOQ. Core needle biopsy was performed. 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 This was ER/PR negative and enough was present for her 2 testing which was negative. Ki 67 was 60%.   Pt has no h/o cancer before this. She has no definitive family cancer history, but a maternal great uncle may have had brain cancer.  She helps care for 3 of her grandkids below 47 years old 3 days per week.   Menarche 12-13 Menopause unsure Parity 1 child age 27  Diagnostic mammogram:12/06/2021 BCG ACR Breast Density Category b: There are scattered areas of fibroglandular density.  FINDINGS: On today's additional diagnostic views, including magnification views, grouped coarse heterogeneous calcifications are confirmed within the upper-outer quadrant of the RIGHT breast, measuring 6 mm extent.  IMPRESSION: Grouped coarse heterogeneous calcifications within the upper-outer quadrant of the RIGHT breast, measuring 6 mm extent. These may be fibroadenomatous calcifications. Stereotactic biopsy is recommended to exclude  malignancy.  RECOMMENDATION: Stereotactic biopsy for the RIGHT breast calcifications.  Stereotactic biopsy is scheduled on September 6th.  I have discussed the findings and recommendations with the patient. If applicable, a reminder letter will be sent to the patient regarding the next appointment.  BI-RADS CATEGORY 4: Suspicious.  Pathology core needle biopsy: 12/18/2021 Breast, right, needle core biopsy, upper outer quadrant, x clip - DUCTAL CARCINOMA IN SITU, SOLID AND CRIBRIFORM TYPE WITH COMEDONECROSIS AND ASSOCIATED CALCIFICATIONS, NUCLEAR GRADE 3 OF 3 - FOCAL MICROINVASION IS PRESENT (LESS THAN 1 MM) WITH EVIDENCE OF LYMPHOVASCULAR INVASION - NECROSIS: PRESENT - CALCIFICATIONS: PRESENT - DCIS LENGTH: 7 MM IN GREATEST LINEAR DIMENSION ON FRAGMENTED CORES  Receptors: The tumor cells are NEGATIVE for Her2 (1+). Estrogen Receptor: 0%, NEGATIVE Progesterone Receptor: 0%, NEGATIVE Proliferation Marker Ki67: 60%  Review of Systems: A complete review of systems was obtained from the patient. I have reviewed this information and discussed as appropriate with the patient. See HPI as well for other ROS.  Medical History: Past Medical History:  Diagnosis Date  Anxiety  Arthritis  GERD (gastroesophageal reflux disease)  Thyroid disease   Patient Active Problem List  Diagnosis  Malignant neoplasm of upper-outer quadrant of right breast in female, estrogen receptor negative (CMS-HCC)  Carpal tunnel syndrome  Cavus deformity of foot, acquired  Degenerative arthritis of lumbar spine  Degenerative disc disease, cervical  Intraductal carcinoma in situ of breast  Osteoarthritis, hand  Primary hypothyroidism  Sensorineural hearing loss (SNHL) of right ear with unrestricted hearing of left ear  Cervical radiculopathy at C8  Heartburn  Nervousness  Anxiousness   Past Surgical History:  Procedure Laterality Date  TONSILLECTOMY 1981    Allergies  Allergen Reactions   Montelukast Sodium Other (See Comments)  Depressed mood  Nortriptyline Unknown  depression  Omeprazole Hives  Sulfa (Sulfonamide Antibiotics) Nausea And Vomiting  Mold Other (See Comments)  Neomycin Other (See Comments)  Other Other (See Comments)  Pseudoephedrine Hcl Other (See Comments)  Esomeprazole Other (See Comments) and Rash   Current Outpatient Medications on File Prior to Visit  Medication Sig Dispense Refill  cholecalciferol (VITAMIN D3) 2,000 unit capsule Take 2,000 Units by mouth once daily  levothyroxine (TIROSINT) 100 mcg Cap Take 100 mcg by mouth once a week Pt takes this medication once a week  levothyroxine 88 mcg Cap Take 88 mcg by mouth once daily Pt takes this medication 6 days a week  sertraline (ZOLOFT) 25 MG tablet Take 25 mg by mouth once daily   No current facility-administered medications on file prior to visit.   Family History  Problem Relation Age of Onset  Hyperlipidemia (Elevated cholesterol) Father    Social History   Tobacco Use  Smoking Status Never  Smokeless Tobacco Never    Social History   Socioeconomic History  Marital status: Married  Tobacco Use  Smoking status: Never  Smokeless tobacco: Never  Vaping Use  Vaping Use: Never used  Substance and Sexual Activity  Alcohol use: Yes  Comment: very rare, less than once a month  Drug use: Never  Sexual activity: Defer   Objective:   Vitals:  12/25/21 1658  BP: (!) 140/85  Pulse: 106  Resp: 18  Temp: 36.7 C (98.1 F)  Weight: 77 kg (169 lb 12.8 oz)  Height: 170.2 cm (_0 )   Body mass index is 26.59 kg/m.  Gen: No acute distress. Well nourished and well groomed.  Neurological: Alert and oriented to person, place, and time. Coordination normal.  Head: Normocephalic and atraumatic.  Eyes: Conjunctivae are normal. Pupils are equal, round, and reactive to light. No scleral icterus.  Neck: Normal range of motion. Neck supple. No tracheal deviation or thyromegaly present.   Cardiovascular: Normal rate, regular rhythm, normal heart sounds and intact distal pulses. Exam reveals no gallop and no friction rub. No murmur heard. Breast: breasts relatively symmetric. No palpable masses. No skin dimpling, no nipple retraction. Faint bruising laterally on right upper outer breast. No LAD. Left breast benign.  Respiratory: Effort normal. No respiratory distress. No chest wall tenderness. Breath sounds normal. No wheezes, rales or rhonchi.  GI: Soft. Bowel sounds are normal. The abdomen is soft and nontender. There is no rebound and no guarding.  Musculoskeletal: Normal range of motion. Extremities are nontender.  Lymphadenopathy: No cervical, preauricular, postauricular or axillary adenopathy is present Skin: Skin is warm and dry. No rash noted. No diaphoresis. No erythema. No pallor. No clubbing, cyanosis, or edema.  Psychiatric: Normal mood and affect. Behavior is normal. Judgment and thought content normal.   Labs Cbc with WBCs 11.4k CMET with gluc 156, o/w normal.   Assessment and Plan:   ICD-10-CM  1. Malignant neoplasm of upper-outer quadrant of right breast in female, estrogen receptor negative (CMS-HCC) C50.411  Z17.1    Pt has new diagnosis of [id number] right breast cancer. I would treat this as primarily DCIS and recommend seed localized lumpectomy. This would be followed by radiation. If there is more of an invasive component of cancer, then she would need to go back for sentinel node and port placement. For microinvasive disease, this would not be required.   The surgical procedure was described to the patient and her husband. I discussed the incision type and location and that  we would may need radiology involved with a seed marker.   We discussed the risks bleeding, infection, damage to other structures, need for further procedures/surgeries. We discussed the risk of seroma. The patient was advised if the breast has cancer, we may need to go back to surgery  for additional tissue to obtain negative margins or for a lymph node biopsy. The patient was advised that these are the most common complications, but that others can occur as well. I discussed the risk of alteration in breast contour or size. I discussed risk of chronic pain. There are rare instances of heart/lung issues post op as well as blood clots.   They were advised against taking aspirin or other anti-inflammatory agents/blood thinners the week before surgery.   The risks and benefits of the procedure were described to the patient and she wishes to proceed.   She elects to proceed with genetic testing.   No follow-ups on file.  Milus Height, MD FACS Surgical Oncology, General Surgery, Trauma and [facility] [place]

## 2022-01-08 NOTE — Interval H&P Note (Signed)
History and Physical Interval Note:  01/08/2022 9:08 AM  Samantha [name]  has presented today for surgery, with the diagnosis of RIGHT BREAST CANCER.  The various methods of treatment have been discussed with the patient and family. After consideration of risks, benefits and other options for treatment, the patient has consented to  Procedure(s): RIGHT BREAST LUMPECTOMY WITH RADIOACTIVE SEED LOCALIZATION (Right) as a surgical intervention.  The patient's history has been reviewed, patient examined, no change in status, stable for surgery.  I have reviewed the patient's chart and labs.  Questions were answered to the patient's satisfaction.     Stark Klein

## 2022-01-08 NOTE — Transfer of Care (Signed)
Immediate Anesthesia Transfer of Care Note  Patient: Samantha [name]  Procedure(s) Performed: RIGHT BREAST LUMPECTOMY WITH RADIOACTIVE SEED LOCALIZATION (Right: Breast)  Patient Location: PACU  Anesthesia Type:General  Level of Consciousness: awake, alert  and oriented  Airway & Oxygen Therapy: Patient Spontanous Breathing and Patient connected to face mask oxygen  Post-op Assessment: Report given to RN and Post -op Vital signs reviewed and stable  Post vital signs: Reviewed and stable  Last Vitals:  Vitals Value Taken Time  BP 127/82   Temp    Pulse 99 01/08/22 1039  Resp 14   SpO2 100 % 01/08/22 1039  Vitals shown include unvalidated device data.  Last Pain:  Vitals:   01/08/22 0721  TempSrc: Oral      Patients Stated Pain Goal: 2 (13/08/65 7846)  Complications: No notable events documented.

## 2022-01-08 NOTE — Anesthesia Procedure Notes (Signed)
Procedure Name: LMA Insertion Date/Time: 01/08/2022 9:27 AM  Performed by: Genelle Bal, CRNAPre-anesthesia Checklist: Patient identified, Emergency Drugs available, Suction available and Patient being monitored Patient Re-evaluated:Patient Re-evaluated prior to induction Oxygen Delivery Method: Circle system utilized Preoxygenation: Pre-oxygenation with 100% oxygen Induction Type: IV induction Ventilation: Mask ventilation without difficulty LMA: LMA inserted LMA Size: 4.0 Number of attempts: 1 Airway Equipment and Method: Bite block Placement Confirmation: positive ETCO2 Tube secured with: Tape Dental Injury: Teeth and Oropharynx as per pre-operative assessment

## 2022-01-08 NOTE — H&P (View-Only) (Signed)
REFERRING PHYSICIAN: Julien Girt  PROVIDER: Georgianne Fick, MD  Care Team: Patient Care Team: Jerlyn Ly, MD as PCP - General (Internal Medicine) Truitt Merle, MD (Hematology and Oncology) Marye Round, MD (Radiation Oncology) Lanney Gins, MD (Endocrinology) Georgianne Fick, MD as Consulting Provider (Surgical Oncology) Jake Samples, MD (Obstetrics and Gynecology)   MRN: [id number] DOB: [birth year] DATE OF ENCOUNTER: 12/25/2021  Subjective   Chief Complaint: Breast Cancer  History of Present Illness: Samantha [name] is a 61 y.o. female who is seen today as an office consultation at the request of Dr. Burr Medico for evaluation of Breast Cancer  Pt presents with a new diagnosis of right breast cancer 12/2021. She had screening detected calcifications. Diagnostic imaging confirmed this. It showed 6 mm of calcs in the UOQ. Core needle biopsy was performed. 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 This was ER/PR negative and enough was present for her 2 testing which was negative. Ki 67 was 60%.   Pt has no h/o cancer before this. She has no definitive family cancer history, but a maternal great uncle may have had brain cancer.  She helps care for 3 of her grandkids below 47 years old 3 days per week.   Menarche 12-13 Menopause unsure Parity 1 child age 27  Diagnostic mammogram:12/06/2021 BCG ACR Breast Density Category b: There are scattered areas of fibroglandular density.  FINDINGS: On today's additional diagnostic views, including magnification views, grouped coarse heterogeneous calcifications are confirmed within the upper-outer quadrant of the RIGHT breast, measuring 6 mm extent.  IMPRESSION: Grouped coarse heterogeneous calcifications within the upper-outer quadrant of the RIGHT breast, measuring 6 mm extent. These may be fibroadenomatous calcifications. Stereotactic biopsy is recommended to exclude  malignancy.  RECOMMENDATION: Stereotactic biopsy for the RIGHT breast calcifications.  Stereotactic biopsy is scheduled on September 6th.  I have discussed the findings and recommendations with the patient. If applicable, a reminder letter will be sent to the patient regarding the next appointment.  BI-RADS CATEGORY 4: Suspicious.  Pathology core needle biopsy: 12/18/2021 Breast, right, needle core biopsy, upper outer quadrant, x clip - DUCTAL CARCINOMA IN SITU, SOLID AND CRIBRIFORM TYPE WITH COMEDONECROSIS AND ASSOCIATED CALCIFICATIONS, NUCLEAR GRADE 3 OF 3 - FOCAL MICROINVASION IS PRESENT (LESS THAN 1 MM) WITH EVIDENCE OF LYMPHOVASCULAR INVASION - NECROSIS: PRESENT - CALCIFICATIONS: PRESENT - DCIS LENGTH: 7 MM IN GREATEST LINEAR DIMENSION ON FRAGMENTED CORES  Receptors: The tumor cells are NEGATIVE for Her2 (1+). Estrogen Receptor: 0%, NEGATIVE Progesterone Receptor: 0%, NEGATIVE Proliferation Marker Ki67: 60%  Review of Systems: A complete review of systems was obtained from the patient. I have reviewed this information and discussed as appropriate with the patient. See HPI as well for other ROS.  Medical History: Past Medical History:  Diagnosis Date  Anxiety  Arthritis  GERD (gastroesophageal reflux disease)  Thyroid disease   Patient Active Problem List  Diagnosis  Malignant neoplasm of upper-outer quadrant of right breast in female, estrogen receptor negative (CMS-HCC)  Carpal tunnel syndrome  Cavus deformity of foot, acquired  Degenerative arthritis of lumbar spine  Degenerative disc disease, cervical  Intraductal carcinoma in situ of breast  Osteoarthritis, hand  Primary hypothyroidism  Sensorineural hearing loss (SNHL) of right ear with unrestricted hearing of left ear  Cervical radiculopathy at C8  Heartburn  Nervousness  Anxiousness   Past Surgical History:  Procedure Laterality Date  TONSILLECTOMY 1981    Allergies  Allergen Reactions   Montelukast Sodium Other (See Comments)  Depressed mood  Nortriptyline Unknown  depression  Omeprazole Hives  Sulfa (Sulfonamide Antibiotics) Nausea And Vomiting  Mold Other (See Comments)  Neomycin Other (See Comments)  Other Other (See Comments)  Pseudoephedrine Hcl Other (See Comments)  Esomeprazole Other (See Comments) and Rash   Current Outpatient Medications on File Prior to Visit  Medication Sig Dispense Refill  cholecalciferol (VITAMIN D3) 2,000 unit capsule Take 2,000 Units by mouth once daily  levothyroxine (TIROSINT) 100 mcg Cap Take 100 mcg by mouth once a week Pt takes this medication once a week  levothyroxine 88 mcg Cap Take 88 mcg by mouth once daily Pt takes this medication 6 days a week  sertraline (ZOLOFT) 25 MG tablet Take 25 mg by mouth once daily   No current facility-administered medications on file prior to visit.   Family History  Problem Relation Age of Onset  Hyperlipidemia (Elevated cholesterol) Father    Social History   Tobacco Use  Smoking Status Never  Smokeless Tobacco Never    Social History   Socioeconomic History  Marital status: Married  Tobacco Use  Smoking status: Never  Smokeless tobacco: Never  Vaping Use  Vaping Use: Never used  Substance and Sexual Activity  Alcohol use: Yes  Comment: very rare, less than once a month  Drug use: Never  Sexual activity: Defer   Objective:   Vitals:  12/25/21 1658  BP: (!) 140/85  Pulse: 106  Resp: 18  Temp: 36.7 C (98.1 F)  Weight: 77 kg (169 lb 12.8 oz)  Height: 170.2 cm (_0 )   Body mass index is 26.59 kg/m.  Gen: No acute distress. Well nourished and well groomed.  Neurological: Alert and oriented to person, place, and time. Coordination normal.  Head: Normocephalic and atraumatic.  Eyes: Conjunctivae are normal. Pupils are equal, round, and reactive to light. No scleral icterus.  Neck: Normal range of motion. Neck supple. No tracheal deviation or thyromegaly present.   Cardiovascular: Normal rate, regular rhythm, normal heart sounds and intact distal pulses. Exam reveals no gallop and no friction rub. No murmur heard. Breast: breasts relatively symmetric. No palpable masses. No skin dimpling, no nipple retraction. Faint bruising laterally on right upper outer breast. No LAD. Left breast benign.  Respiratory: Effort normal. No respiratory distress. No chest wall tenderness. Breath sounds normal. No wheezes, rales or rhonchi.  GI: Soft. Bowel sounds are normal. The abdomen is soft and nontender. There is no rebound and no guarding.  Musculoskeletal: Normal range of motion. Extremities are nontender.  Lymphadenopathy: No cervical, preauricular, postauricular or axillary adenopathy is present Skin: Skin is warm and dry. No rash noted. No diaphoresis. No erythema. No pallor. No clubbing, cyanosis, or edema.  Psychiatric: Normal mood and affect. Behavior is normal. Judgment and thought content normal.   Labs Cbc with WBCs 11.4k CMET with gluc 156, o/w normal.   Assessment and Plan:   ICD-10-CM  1. Malignant neoplasm of upper-outer quadrant of right breast in female, estrogen receptor negative (CMS-HCC) C50.411  Z17.1    Pt has new diagnosis of [id number] right breast cancer. I would treat this as primarily DCIS and recommend seed localized lumpectomy. This would be followed by radiation. If there is more of an invasive component of cancer, then she would need to go back for sentinel node and port placement. For microinvasive disease, this would not be required.   The surgical procedure was described to the patient and her husband. I discussed the incision type and location and that  we would may need radiology involved with a seed marker.   We discussed the risks bleeding, infection, damage to other structures, need for further procedures/surgeries. We discussed the risk of seroma. The patient was advised if the breast has cancer, we may need to go back to surgery  for additional tissue to obtain negative margins or for a lymph node biopsy. The patient was advised that these are the most common complications, but that others can occur as well. I discussed the risk of alteration in breast contour or size. I discussed risk of chronic pain. There are rare instances of heart/lung issues post op as well as blood clots.   They were advised against taking aspirin or other anti-inflammatory agents/blood thinners the week before surgery.   The risks and benefits of the procedure were described to the patient and she wishes to proceed.   She elects to proceed with genetic testing.   No follow-ups on file.  Milus Height, MD FACS Surgical Oncology, General Surgery, Trauma and [facility] [place]

## 2022-01-08 NOTE — Discharge Instructions (Addendum)
[facility] Office Phone Number [phone]  BREAST BIOPSY/ PARTIAL MASTECTOMY: POST OP INSTRUCTIONS  Always review your discharge instruction sheet given to you by the facility where your surgery was performed.  IF YOU HAVE DISABILITY OR FAMILY LEAVE FORMS, YOU MUST BRING THEM TO THE OFFICE FOR PROCESSING.  DO NOT GIVE THEM TO YOUR DOCTOR.  Take 2 tylenol (acetominophen) three times a day for 3 days.  If you still have pain, add ibuprofen with food in between if able to take this (if you have kidney issues or stomach issues, do not take ibuprofen).  If both of those are not enough, add the narcotic pain pill.  If you find you are needing a lot of this overnight after surgery, call the next morning for a refill.    Prescriptions will not be filled after 5pm or on week-ends. Take your usually prescribed medications unless otherwise directed You should eat very light the first 24 hours after surgery, such as soup, crackers, pudding, etc.  Resume your normal diet the day after surgery. Most patients will experience some swelling and bruising in the breast.  Ice packs and a good support bra will help.  Swelling and bruising can take several days to resolve.  It is common to experience some constipation if taking pain medication after surgery.  Increasing fluid intake and taking a stool softener will usually help or prevent this problem from occurring.  A mild laxative (Milk of Magnesia or Miralax) should be taken according to package directions if there are no bowel movements after 48 hours. Unless discharge instructions indicate otherwise, you may remove your bandages 48 hours after surgery, and you may shower at that time.  You may have steri-strips (small skin tapes) in place directly over the incision.  These strips should be left on the skin at least for for 7-10 days.    ACTIVITIES:  You may resume regular daily activities (gradually increasing) beginning the next day.  Wearing a  good support bra or sports bra (or the breast binder) minimizes pain and swelling.  You may have sexual intercourse when it is comfortable. No heavy lifting for 1-2 weeks (not over around 10 pounds).  You may drive when you no longer are taking prescription pain medication, you can comfortably wear a seatbelt, and you can safely maneuver your car and apply brakes. RETURN TO WORK:  __________3-14 days depending on job. _______________ You should see your doctor in the office for a follow-up appointment approximately two weeks after your surgery.  Your doctor's nurse will typically make your follow-up appointment when she calls you with your pathology report.  Expect your pathology report 3-4 business days after your surgery.  You may call to check if you do not hear from us after three days.   WHEN TO CALL YOUR DOCTOR: Fever over 101.0 Nausea and/or vomiting. Extreme swelling or bruising. Continued bleeding from incision. Increased pain, redness, or drainage from the incision.  The clinic staff is available to answer your questions during regular business hours.  Please don't hesitate to call and ask to speak to one of the nurses for clinical concerns.  If you have a medical emergency, go to the nearest emergency room or call 911.  A surgeon from [facility] is always on call at the hospital.  For further questions, please visit [email or web address]   

## 2022-01-08 NOTE — Op Note (Signed)
Right Breast Radioactive seed localized lumpectomy  Indications: This patient presents with history of right breast cancer, upper outer quadrant, cTis, receptors -/-, DCIS with focal microinvasion  Pre-operative Diagnosis: right breast cancer  Post-operative Diagnosis: Same  Surgeon: Stark Klein   Anesthesia: General endotracheal anesthesia  ASA Class: 2  Procedure Details  The patient was seen in the Holding Room. The risks, benefits, complications, treatment options, and expected outcomes were discussed with the patient. The possibilities of bleeding, infection, the need for additional procedures, failure to diagnose a condition, and creating a complication requiring other procedures or operations were discussed with the patient. The patient concurred with the proposed plan, giving informed consent.  The site of surgery properly noted/marked. The patient was taken to Operating Room # 2, identified, and the procedure verified as Right breast seed localized lumpectomy.  The right breast and chest were prepped and draped in standard fashion. A superolateral circumareolar incision was made near the previously placed radioactive seed.  Dissection was carried down around the point of maximum signal intensity. The cautery was used to perform the dissection.   While removing the specimen, the posterior aspect pulled apart a bit and the seed fell into the cavity.  The seed had been medially located.  The specimen was inked with the margin marker paint kit.   The clip was present in the specimen.  A posterior margin was taken and the seed was in that.  The seed was present in the posterior margin.  A medial margin was taken as well.   The background signal in the breast was zero.  Hemostasis was achieved with cautery.  The cavity was marked with clips on each border other than the anterior border.  The wound was irrigated and closed with 3-0 vicryl interrupted deep dermal sutures and 4-0 monocryl running  subcuticular suture.  Local anesthetic was infiltrated into the skin    Sterile dressings were applied. At the end of the operation, all sponge, instrument, and needle counts were correct.   Findings: Seed, clip in specimen.  posterior margin is pectoralis.     Estimated Blood Loss:  min         Specimens: right breast lumpectomy with clip, posterior margin with seed, medial margin         Complications:  None; patient tolerated the procedure well.         Disposition: PACU - hemodynamically stable.         Condition: stable

## 2022-01-08 NOTE — Anesthesia Preprocedure Evaluation (Addendum)
Anesthesia Evaluation  Patient identified by MRN, date of birth, ID band Patient awake    Reviewed: Allergy & Precautions, NPO status , Patient's Chart, lab work & pertinent test results, reviewed documented beta blocker date and time   Airway Mallampati: II  TM Distance: >3 FB Neck ROM: Full    Dental no notable dental hx. (+) Teeth Intact, Dental Advisory Given, Caps   Pulmonary neg pulmonary ROS,    Pulmonary exam normal breath sounds clear to auscultation       Cardiovascular negative cardio ROS Normal cardiovascular exam Rhythm:Regular Rate:Normal     Neuro/Psych PSYCHIATRIC DISORDERS Anxiety Depression  Neuromuscular disease    GI/Hepatic Neg liver ROS, GERD  Medicated,  Endo/Other  Hypothyroidism Right Breast Ca  Renal/GU negative Renal ROS  negative genitourinary   Musculoskeletal  (+) Arthritis , Osteoarthritis,  Low back pain with left radiculpathy DDD lumbar spine   Abdominal   Peds  Hematology negative hematology ROS (+)   Anesthesia Other Findings   Reproductive/Obstetrics                            Anesthesia Physical Anesthesia Plan  ASA: 2  Anesthesia Plan:    Post-op Pain Management: Minimal or no pain anticipated, Precedex, Tylenol PO (pre-op)* and Dilaudid IV   Induction: Intravenous  PONV Risk Score and Plan: 3 and Treatment may vary due to age or medical condition, Ondansetron and Dexamethasone  Airway Management Planned: LMA  Additional Equipment: None  Intra-op Plan:   Post-operative Plan: Extubation in OR  Informed Consent: I have reviewed the patients History and Physical, chart, labs and discussed the procedure including the risks, benefits and alternatives for the proposed anesthesia with the patient or authorized representative who has indicated his/her understanding and acceptance.     Dental advisory given  Plan Discussed with: CRNA and  Anesthesiologist  Anesthesia Plan Comments:         Anesthesia Quick Evaluation

## 2022-01-09 ENCOUNTER — Encounter (HOSPITAL_COMMUNITY): Payer: Self-pay | Admitting: General Surgery

## 2022-01-14 ENCOUNTER — Encounter: Payer: Self-pay | Admitting: *Deleted

## 2022-01-14 ENCOUNTER — Telehealth: Payer: Self-pay | Admitting: General Surgery

## 2022-01-14 NOTE — Telephone Encounter (Signed)
Discussed pathology with patient.  Will need SLN bx.  May repeat prognostics as well.  If this is truly 8 mm of invasive triple negative cancer, might need chemo.

## 2022-01-17 LAB — SURGICAL PATHOLOGY

## 2022-01-20 ENCOUNTER — Encounter: Payer: Self-pay | Admitting: *Deleted

## 2022-01-20 ENCOUNTER — Other Ambulatory Visit: Payer: Self-pay | Admitting: General Surgery

## 2022-01-20 ENCOUNTER — Telehealth: Payer: Self-pay | Admitting: General Surgery

## 2022-01-20 NOTE — Telephone Encounter (Signed)
Discussed pathology with patient.  Pt has appt with Dr. Burr Medico this week to make decision on chemo.  Orders written for surgery.

## 2022-01-21 ENCOUNTER — Encounter: Payer: Self-pay | Admitting: *Deleted

## 2022-01-21 ENCOUNTER — Encounter (HOSPITAL_BASED_OUTPATIENT_CLINIC_OR_DEPARTMENT_OTHER): Payer: Self-pay | Admitting: General Surgery

## 2022-01-21 ENCOUNTER — Encounter (HOSPITAL_COMMUNITY): Payer: Self-pay

## 2022-01-21 ENCOUNTER — Other Ambulatory Visit: Payer: Self-pay

## 2022-01-21 DIAGNOSIS — Z171 Estrogen receptor negative status [ER-]: Secondary | ICD-10-CM

## 2022-01-24 ENCOUNTER — Inpatient Hospital Stay: Payer: Managed Care, Other (non HMO) | Attending: Hematology | Admitting: Hematology

## 2022-01-24 ENCOUNTER — Other Ambulatory Visit: Payer: Self-pay

## 2022-01-24 ENCOUNTER — Encounter: Payer: Self-pay | Admitting: Hematology

## 2022-01-24 ENCOUNTER — Ambulatory Visit: Payer: Managed Care, Other (non HMO) | Admitting: Hematology

## 2022-01-24 VITALS — BP 138/77 | HR 100 | Temp 98.0°F | Resp 16 | Wt 169.2 lb

## 2022-01-24 DIAGNOSIS — C50411 Malignant neoplasm of upper-outer quadrant of right female breast: Secondary | ICD-10-CM | POA: Insufficient documentation

## 2022-01-24 DIAGNOSIS — Z171 Estrogen receptor negative status [ER-]: Secondary | ICD-10-CM | POA: Diagnosis not present

## 2022-01-24 DIAGNOSIS — Z79899 Other long term (current) drug therapy: Secondary | ICD-10-CM | POA: Insufficient documentation

## 2022-01-24 DIAGNOSIS — Z888 Allergy status to other drugs, medicaments and biological substances status: Secondary | ICD-10-CM | POA: Diagnosis not present

## 2022-01-24 DIAGNOSIS — Z79890 Hormone replacement therapy (postmenopausal): Secondary | ICD-10-CM | POA: Insufficient documentation

## 2022-01-24 DIAGNOSIS — Z7952 Long term (current) use of systemic steroids: Secondary | ICD-10-CM | POA: Diagnosis not present

## 2022-01-24 DIAGNOSIS — Z882 Allergy status to sulfonamides status: Secondary | ICD-10-CM | POA: Insufficient documentation

## 2022-01-24 MED ORDER — PEGFILGRASTIM INJECTION 6 MG/0.6ML ~~LOC~~: 6.0000 mg | PREFILLED_SYRINGE | Freq: Once | SUBCUTANEOUS | 0 refills | Status: AC

## 2022-01-24 MED ORDER — ONDANSETRON HCL 8 MG PO TABS: 8.0000 mg | ORAL_TABLET | Freq: Three times a day (TID) | ORAL | 1 refills | Status: DC | PRN

## 2022-01-24 MED ORDER — PROCHLORPERAZINE MALEATE 10 MG PO TABS: 10.0000 mg | ORAL_TABLET | Freq: Four times a day (QID) | ORAL | 1 refills | Status: DC | PRN

## 2022-01-24 MED ORDER — DEXAMETHASONE 4 MG PO TABS: ORAL_TABLET | ORAL | 1 refills | Status: DC

## 2022-01-24 MED ORDER — LIDOCAINE-PRILOCAINE 2.5-2.5 % EX CREA: TOPICAL_CREAM | CUTANEOUS | 3 refills | Status: DC

## 2022-01-24 NOTE — Progress Notes (Signed)
       Patient Instructions  The night before surgery:  No food after midnight. ONLY clear liquids after midnight  The day of surgery (if you do NOT have diabetes):  Drink ONE (1) Pre-Surgery Clear Ensure as directed.   This drink was given to you during your hospital  pre-op appointment visit. The pre-op nurse will instruct you on the time to drink the  Pre-Surgery Ensure depending on your surgery time. Finish the drink at the designated time by the pre-op nurse.  Nothing else to drink after completing the  Pre-Surgery Clear Ensure.  The day of surgery (if you have diabetes): Drink ONE (1) Gatorade 2 (G2) as directed. This drink was given to you during your hospital  pre-op appointment visit.  The pre-op nurse will instruct you on the time to drink the   Gatorade 2 (G2) depending on your surgery time. Color of the Gatorade may vary. Red is not allowed. Nothing else to drink after completing the  Gatorade 2 (G2).         If you have questions, please contact your surgeon's office.    Patient given CHG presurgical soap. Education provided and patient verbalized understanding.

## 2022-01-24 NOTE — Progress Notes (Addendum)
[place]   [phone] [phone]   Clinic Follow up Note   Patient Care Team: Crist Infante, MD as PCP - General (Internal Medicine) Mauro Kaufmann, RN as Oncology Nurse Navigator [name], RN as Oncology Nurse Navigator Stark Klein, MD as Consulting Physician (General Surgery) Truitt Merle, MD as Consulting Physician (Hematology) Kyung Rudd, MD as Consulting Physician (Radiation Oncology) Marcene Corning, MD as Consulting Physician (Endocrinology) Stefanie Libel, MD as Consulting Physician ([place]) Marylynn Pearson, MD as Consulting Physician (Obstetrics and Gynecology)  Date of Service:  01/24/2022  CHIEF COMPLAINT: f/u of right breast cancer  CURRENT THERAPY:  To start adjuvant TC  ASSESSMENT & PLAN:  Samantha [name] is a 61 y.o. post-menopausal female with   1. Malignant neoplasm of upper-outer quadrant of right breast, IDC and DCIS, Stage IA, pT1b, cN0, triple negative, Grade 3 -found on screening mammogram. Biopsy on 12/18/21 confirmed DCIS with focal microinvasion present with lymphovascular invasion. -s/p lumpectomy on 01/08/22 with Dr. Barry Dienes, path showed: 8 mm invasive and in situ ductal carcinoma with negative margins. Repeat prognostic panel confirmed triple negative disease.  She is scheduled for a sentinel lymph node biopsy next week. --I reviewed the pathology results with them today. We discussed the aggressive nature of triple negative breast cancers. Given her tumor size is >26m, and her relatively [place] age, my recommendation is for adjuvant chemotherapy. The regimen for triple negative disease consists of 4 cycles of TC (docetaxol/cytoxan). However, if she has positive lymph nodes on upcoming SLNB, I would recommend AC-T (adriamycin/cytoxan followed by taxol). Plan for port placement at time of lymph node procedure to help preserve her veins. She will proceed with adjuvant radiation following chemo. --Chemotherapy  consent: Side effects including but does not not limited to, fatigue, nausea, vomiting, diarrhea, hair loss, neuropathy, fluid retention, renal and kidney dysfunction, neutropenic fever, needed for blood transfusion, bleeding, were discussed with patient in great detail. She agrees to proceed. -The goal of therapy is curative. -We also discussed the role of DigniCap to prevent hair loss from chemotherapy.  I gave her the brochure, she will think about it.     PLAN:  -proceed with right sentinel lymph node biopsy and port placement FBurr Medico10/17 -f/u and chemo class on 11/2 or 11/3, to finalize and schedule her adjuvant chemo. -Her insurance approved first Neulasta injection in our office after first cycle chemo, and she will get the rest of Neulasta injection at home.   No problem-specific Assessment & Plan notes found for this encounter.   SUMMARY OF ONCOLOGIC HISTORY: Oncology History  Malignant neoplasm of upper-outer quadrant of right breast in female, estrogen receptor negative ([place]  12/06/2021 Mammogram   CLINICAL DATA:  Patient returns today to evaluate RIGHT breast calcifications identified on a recent screening mammogram.   EXAM: DIGITAL DIAGNOSTIC UNILATERAL RIGHT MAMMOGRAM  IMPRESSION: Grouped coarse heterogeneous calcifications within the upper-outer quadrant of the RIGHT breast, measuring 6 mm extent. These may be fibroadenomatous calcifications. Stereotactic biopsy is recommended to exclude malignancy.   12/18/2021 Initial Biopsy   Diagnosis Breast, right, needle core biopsy, upper outer quadrant, x clip - DUCTAL CARCINOMA IN SITU, SOLID AND CRIBRIFORM TYPE WITH COMEDONECROSIS AND ASSOCIATED CALCIFICATIONS, NUCLEAR GRADE 3 OF 3 - FOCAL MICROINVASION IS PRESENT (LESS THAN 1 MM) WITH EVIDENCE OF LYMPHOVASCULAR INVASION - NECROSIS: PRESENT - CALCIFICATIONS: PRESENT - DCIS LENGTH: 7 MM IN GREATEST LINEAR DIMENSION ON FRAGMENTED CORES  PROGNOSTIC  INDICATORS Results: IMMUNOHISTOCHEMICAL AND MORPHOMETRIC ANALYSIS  PERFORMED MANUALLY The tumor cells are NEGATIVE for Her2 (1+). Estrogen Receptor: 0%, NEGATIVE Progesterone Receptor: 0%, NEGATIVE Proliferation Marker Ki67: 60%   12/23/2021 Initial Diagnosis   Malignant neoplasm of upper-outer quadrant of right breast in female, estrogen receptor negative ([place])   12/23/2021 Imaging   EXAM: ULTRASOUND OF THE RIGHT AXILLA  IMPRESSION: No abnormal appearing RIGHT axillary lymph nodes.   01/08/2022 Cancer Staging   Staging form: Breast, AJCC 8th Edition - Pathologic stage from 01/08/2022: Stage IB (pT1b, pN0, cM0, G3, ER-, PR-, HER2-) - Signed by Truitt Merle, MD on 01/24/2022 Stage prefix: Initial diagnosis Histologic grading system: 3 grade system Residual tumor (R): R0 - None   02/05/2022 -  Chemotherapy   Patient is on Treatment Plan : BREAST TC q21d        INTERVAL HISTORY:  Samantha [name] is here for a follow up of breast cancer. She was last seen by me on 12/29/21. She presents to the clinic alone. She reports she has done well postoperatively.   All other systems were reviewed with the patient and are negative.  MEDICAL HISTORY:  Past Medical History:  Diagnosis Date   Allergy    Anxiety 2015   Result of culmination of super stressful caregiving and deaths of 2 immediate family members.  Overall do pretty well.   Cancer [facility]) 2023   right breast   Depression    GERD (gastroesophageal reflux disease)    Hypothyroidism    Right carpal tunnel syndrome 09/19/2019   Thyroid disease     SURGICAL HISTORY: Past Surgical History:  Procedure Laterality Date   BREAST LUMPECTOMY WITH RADIOACTIVE SEED LOCALIZATION Right 01/08/2022   Procedure: RIGHT BREAST LUMPECTOMY WITH RADIOACTIVE SEED LOCALIZATION;  Surgeon: Stark Klein, MD;  Location: [place];  Service: General;  Laterality: Right;   TONSILLECTOMY      I have reviewed the social history and family history with the patient  and they are unchanged from previous note.  ALLERGIES:  is allergic to nortriptyline, singulair [montelukast sodium], sulfonamide derivatives, amitriptyline hcl, esomeprazole, gabapentin, and lexapro [escitalopram].  MEDICATIONS:  Current Outpatient Medications  Medication Sig Dispense Refill   acetaminophen (TYLENOL) 500 MG tablet Take 1,000 mg by mouth every 6 (six) hours as needed for moderate pain.     Calcium Carb-Cholecalciferol (CALCIUM-VITAMIN D3) 250-125 MG-UNIT TABS Take 1 tablet once a day     Cholecalciferol (VITAMIN D3) 1000 units CAPS Take 1,000 Units by mouth daily.     dexamethasone (DECADRON) 4 MG tablet Take 2 tabs by mouth 2 times daily starting day before chemo. Then take 2 tabs daily for 2 days starting day after chemo. Take with food. 30 tablet 1   EPINEPHrine 0.3 mg/0.3 mL IJ SOAJ injection Inject 0.3 mg into the muscle as needed for anaphylaxis. 2 each 1   lidocaine-prilocaine (EMLA) cream Apply to affected area once 30 g 3   LORazepam (ATIVAN) 0.5 MG tablet Take 0.25-0.5 mg by mouth 2 (two) times daily as needed for anxiety.     ondansetron (ZOFRAN) 8 MG tablet Take 1 tablet (8 mg total) by mouth every 8 (eight) hours as needed for nausea or vomiting. Start on the third day after chemotherapy. 30 tablet 1   oxyCODONE (OXY IR/ROXICODONE) 5 MG immediate release tablet Take 1 tablet (5 mg total) by mouth every 6 (six) hours as needed for severe pain. 5 tablet 0   pegfilgrastim (NEULASTA) 6 MG/0.6ML injection Inject 0.6 mLs (6 mg total) into the skin once  for 1 dose. 0.6 mL 0   prochlorperazine (COMPAZINE) 10 MG tablet Take 1 tablet (10 mg total) by mouth every 6 (six) hours as needed for nausea or vomiting. 30 tablet 1   sertraline (ZOLOFT) 50 MG tablet Take 50 mg by mouth daily.     TIROSINT 100 MCG CAPS Take 100 mcg by mouth every Sunday.     TIROSINT 88 MCG CAPS Take 88 mcg by mouth See admin instructions. Take 88 mcg by mouth daily Monday-Saturday  10   No current  facility-administered medications for this visit.    PHYSICAL EXAMINATION: ECOG PERFORMANCE STATUS: 0 - Asymptomatic  Vitals:   01/24/22 1144  BP: 138/77  Pulse: 100  Resp: 16  Temp: 98 F (36.7 C)  SpO2: 94%   Wt Readings from Last 3 Encounters:  01/24/22 169 lb 3.2 oz (76.7 kg)  01/08/22 170 lb 1.6 oz (77.2 kg)  01/03/22 170 lb 1.6 oz (77.2 kg)     GENERAL:alert, no distress and comfortable SKIN: skin color normal, no rashes or significant lesions EYES: normal, Conjunctiva are pink and non-injected, sclera clear  NEURO: alert & oriented x 3 with fluent speech  LABORATORY DATA:  I have reviewed the data as listed    Latest Ref Rng & Units 12/25/2021   12:28 PM 03/09/2013   11:27 PM 06/16/2009    6:47 AM  CBC  WBC 4.0 - 10.5 K/uL 11.4  10.7  8.4   Hemoglobin 12.0 - 15.0 g/dL 15.0  14.4  13.6   Hematocrit 36.0 - 46.0 % 45.8  42.7  42.4   Platelets 150 - 400 K/uL 306  310  284         Latest Ref Rng & Units 12/25/2021   12:28 PM 03/09/2013   11:27 PM 06/16/2009    6:47 AM  CMP  Glucose 70 - 99 mg/dL 156  113  115   BUN 8 - 23 mg/dL 13  11  9    Creatinine 0.44 - 1.00 mg/dL 0.66  0.62  0.82   Sodium 135 - 145 mmol/L 137  140  139   Potassium 3.5 - 5.1 mmol/L 3.6  3.5  3.8   Chloride 98 - 111 mmol/L 104  102  107   CO2 22 - 32 mmol/L 27  26  23    Calcium 8.9 - 10.3 mg/dL 10.1  9.6  9.1   Total Protein 6.5 - 8.1 g/dL 8.1     Total Bilirubin 0.3 - 1.2 mg/dL 0.5     Alkaline Phos 38 - 126 U/L 104     AST 15 - 41 U/L 28     ALT 0 - 44 U/L 44         RADIOGRAPHIC STUDIES: I have personally reviewed the radiological images as listed and agreed with the findings in the report. No results found.    No orders of the defined types were placed in this encounter.  All questions were answered. The patient knows to call the clinic with any problems, questions or concerns. No barriers to learning was detected. The total time spent in the appointment was 30 minutes.      Truitt Merle, MD 01/24/2022   I, Wilburn Mylar, am acting as scribe for Truitt Merle, MD.   I have reviewed the above documentation for accuracy and completeness, and I agree with the above.

## 2022-01-24 NOTE — Progress Notes (Signed)
START ON PATHWAY REGIMEN - Breast     A cycle is every 21 days:     Docetaxel      Cyclophosphamide   **Always confirm dose/schedule in your pharmacy ordering system**  Patient Characteristics: Postoperative without Neoadjuvant Therapy (Pathologic Staging), Invasive Disease, Adjuvant Therapy, HER2 Negative, ER Negative, Node Negative, pT1a-b, N0, Chemotherapy Indicated Therapeutic Status: Postoperative without Neoadjuvant Therapy (Pathologic Staging) AJCC Grade: G3 AJCC N Category: pN0 AJCC M Category: cM0 ER Status: Negative (-) AJCC 8 Stage Grouping: IB HER2 Status: Negative (-) Oncotype Dx Recurrence Score: Not Appropriate AJCC T Category: pT1b PR Status: Negative (-) Intervention Indicated: Chemotherapy Intent of Therapy: Curative Intent, Discussed with Patient

## 2022-01-25 ENCOUNTER — Encounter: Payer: Self-pay | Admitting: Hematology

## 2022-01-25 ENCOUNTER — Other Ambulatory Visit: Payer: Self-pay

## 2022-01-27 ENCOUNTER — Encounter: Payer: Self-pay | Admitting: *Deleted

## 2022-01-28 ENCOUNTER — Other Ambulatory Visit: Payer: Self-pay

## 2022-01-28 ENCOUNTER — Ambulatory Visit (HOSPITAL_BASED_OUTPATIENT_CLINIC_OR_DEPARTMENT_OTHER): Payer: Managed Care, Other (non HMO) | Admitting: Anesthesiology

## 2022-01-28 ENCOUNTER — Ambulatory Visit (HOSPITAL_COMMUNITY): Payer: Managed Care, Other (non HMO)

## 2022-01-28 ENCOUNTER — Encounter (HOSPITAL_COMMUNITY)
Admission: AD | Disposition: A | Discharge status: Home / Self Care | Payer: Self-pay | Source: Home / Self Care | Attending: General Surgery

## 2022-01-28 ENCOUNTER — Observation Stay (HOSPITAL_COMMUNITY): Payer: Managed Care, Other (non HMO)

## 2022-01-28 ENCOUNTER — Inpatient Hospital Stay (HOSPITAL_BASED_OUTPATIENT_CLINIC_OR_DEPARTMENT_OTHER)
Admission: AD | Admit: 2022-01-28 | Discharge: 2022-02-03 | DRG: 581 | Disposition: A | Discharge status: Home / Self Care | Payer: Managed Care, Other (non HMO) | Attending: General Surgery | Admitting: General Surgery

## 2022-01-28 ENCOUNTER — Encounter (HOSPITAL_BASED_OUTPATIENT_CLINIC_OR_DEPARTMENT_OTHER): Payer: Self-pay | Admitting: General Surgery

## 2022-01-28 DIAGNOSIS — Z171 Estrogen receptor negative status [ER-]: Secondary | ICD-10-CM

## 2022-01-28 DIAGNOSIS — F419 Anxiety disorder, unspecified: Secondary | ICD-10-CM | POA: Diagnosis present

## 2022-01-28 DIAGNOSIS — C50919 Malignant neoplasm of unspecified site of unspecified female breast: Secondary | ICD-10-CM | POA: Diagnosis present

## 2022-01-28 DIAGNOSIS — H9041 Sensorineural hearing loss, unilateral, right ear, with unrestricted hearing on the contralateral side: Secondary | ICD-10-CM | POA: Diagnosis present

## 2022-01-28 DIAGNOSIS — Z79899 Other long term (current) drug therapy: Secondary | ICD-10-CM

## 2022-01-28 DIAGNOSIS — Z888 Allergy status to other drugs, medicaments and biological substances status: Secondary | ICD-10-CM

## 2022-01-28 DIAGNOSIS — M5412 Radiculopathy, cervical region: Secondary | ICD-10-CM | POA: Diagnosis present

## 2022-01-28 DIAGNOSIS — C50911 Malignant neoplasm of unspecified site of right female breast: Secondary | ICD-10-CM | POA: Diagnosis not present

## 2022-01-28 DIAGNOSIS — K219 Gastro-esophageal reflux disease without esophagitis: Secondary | ICD-10-CM | POA: Diagnosis present

## 2022-01-28 DIAGNOSIS — Z882 Allergy status to sulfonamides status: Secondary | ICD-10-CM

## 2022-01-28 DIAGNOSIS — M47816 Spondylosis without myelopathy or radiculopathy, lumbar region: Secondary | ICD-10-CM | POA: Diagnosis present

## 2022-01-28 DIAGNOSIS — M1990 Unspecified osteoarthritis, unspecified site: Secondary | ICD-10-CM | POA: Diagnosis present

## 2022-01-28 DIAGNOSIS — J95811 Postprocedural pneumothorax: Secondary | ICD-10-CM | POA: Diagnosis not present

## 2022-01-28 DIAGNOSIS — Z853 Personal history of malignant neoplasm of breast: Secondary | ICD-10-CM

## 2022-01-28 DIAGNOSIS — Z01818 Encounter for other preprocedural examination: Principal | ICD-10-CM

## 2022-01-28 DIAGNOSIS — Z883 Allergy status to other anti-infective agents status: Secondary | ICD-10-CM

## 2022-01-28 DIAGNOSIS — Z79890 Hormone replacement therapy (postmenopausal): Secondary | ICD-10-CM

## 2022-01-28 DIAGNOSIS — E039 Hypothyroidism, unspecified: Secondary | ICD-10-CM | POA: Diagnosis present

## 2022-01-28 DIAGNOSIS — C50411 Malignant neoplasm of upper-outer quadrant of right female breast: Principal | ICD-10-CM | POA: Diagnosis present

## 2022-01-28 HISTORY — PX: PORTACATH PLACEMENT: SHX2246

## 2022-01-28 HISTORY — PX: SENTINEL NODE BIOPSY: SHX6608

## 2022-01-28 LAB — CBC
HCT: 42.1 % (ref 36.0–46.0)
Hemoglobin: 14.1 g/dL (ref 12.0–15.0)
MCH: 31.1 pg (ref 26.0–34.0)
MCHC: 33.5 g/dL (ref 30.0–36.0)
MCV: 92.7 fL (ref 80.0–100.0)
Platelets: 282 10*3/uL (ref 150–400)
RBC: 4.54 MIL/uL (ref 3.87–5.11)
RDW: 12.9 % (ref 11.5–15.5)
WBC: 10.7 10*3/uL — ABNORMAL HIGH (ref 4.0–10.5)
nRBC: 0 % (ref 0.0–0.2)

## 2022-01-28 LAB — CREATININE, SERUM
Creatinine, Ser: 0.7 mg/dL (ref 0.44–1.00)
GFR, Estimated: >60 mL/min (ref >60)

## 2022-01-28 SURGERY — BIOPSY, LYMPH NODE, SENTINEL
Anesthesia: Regional | Site: Chest | Laterality: Right

## 2022-01-28 MED ORDER — MIDAZOLAM HCL 2 MG/2ML IJ SOLN
2.0000 mg | Freq: Once | INTRAMUSCULAR | Status: AC
  Administered 2022-01-28: 2 mg via INTRAVENOUS

## 2022-01-28 MED ORDER — MIDAZOLAM HCL 2 MG/2ML IJ SOLN
INTRAMUSCULAR | Status: AC
  Filled 2022-01-28: qty 2

## 2022-01-28 MED ORDER — HEPARIN (PORCINE) IN NACL 2-0.9 UNITS/ML
INTRAMUSCULAR | Status: AC | PRN
  Administered 2022-01-28: 1 via INTRAVENOUS

## 2022-01-28 MED ORDER — PHENYLEPHRINE HCL (PRESSORS) 10 MG/ML IV SOLN
INTRAVENOUS | Status: DC | PRN
  Administered 2022-01-28: 160 ug via INTRAVENOUS

## 2022-01-28 MED ORDER — OXYCODONE HCL 5 MG/5ML PO SOLN: 5.0000 mg | Freq: Once | ORAL | Status: DC | PRN

## 2022-01-28 MED ORDER — CEFAZOLIN SODIUM-DEXTROSE 2-4 GM/100ML-% IV SOLN
INTRAVENOUS | Status: AC
  Filled 2022-01-28: qty 100

## 2022-01-28 MED ORDER — ONDANSETRON HCL 4 MG/2ML IJ SOLN
INTRAMUSCULAR | Status: DC | PRN
  Administered 2022-01-28: 4 mg via INTRAVENOUS

## 2022-01-28 MED ORDER — SODIUM CHLORIDE 0.9% FLUSH
3.0000 mL | Freq: Two times a day (BID) | INTRAVENOUS | Status: DC
  Administered 2022-01-28 – 2022-02-03 (×11): 3 mL via INTRAVENOUS

## 2022-01-28 MED ORDER — DEXAMETHASONE SODIUM PHOSPHATE 4 MG/ML IJ SOLN
INTRAMUSCULAR | Status: DC | PRN
  Administered 2022-01-28: 5 mg via INTRAVENOUS

## 2022-01-28 MED ORDER — MAGTRACE LYMPHATIC TRACER
INTRAMUSCULAR | Status: DC | PRN
  Administered 2022-01-28: 2 mL via INTRAMUSCULAR

## 2022-01-28 MED ORDER — LIDOCAINE 2% (20 MG/ML) 5 ML SYRINGE
INTRAMUSCULAR | Status: AC
  Filled 2022-01-28: qty 5

## 2022-01-28 MED ORDER — PROPOFOL 10 MG/ML IV BOLUS
INTRAVENOUS | Status: DC | PRN
  Administered 2022-01-28: 50 mg via INTRAVENOUS
  Administered 2022-01-28: 150 mg via INTRAVENOUS

## 2022-01-28 MED ORDER — LACTATED RINGERS IV SOLN: INTRAVENOUS | Status: DC

## 2022-01-28 MED ORDER — SERTRALINE HCL 50 MG PO TABS
50.0000 mg | ORAL_TABLET | Freq: Every day | ORAL | Status: DC
  Administered 2022-01-29 – 2022-02-03 (×6): 50 mg via ORAL
  Filled 2022-01-28 (×6): qty 1

## 2022-01-28 MED ORDER — ONDANSETRON HCL 4 MG/2ML IJ SOLN
INTRAMUSCULAR | Status: AC
  Filled 2022-01-28: qty 2

## 2022-01-28 MED ORDER — SODIUM CHLORIDE 0.9% FLUSH: 3.0000 mL | INTRAVENOUS | Status: DC | PRN

## 2022-01-28 MED ORDER — OXYCODONE HCL 5 MG PO TABS: 5.0000 mg | ORAL_TABLET | Freq: Once | ORAL | Status: DC | PRN

## 2022-01-28 MED ORDER — HYDROMORPHONE HCL 1 MG/ML IJ SOLN: 0.5000 mg | INTRAMUSCULAR | Status: DC | PRN

## 2022-01-28 MED ORDER — SODIUM CHLORIDE 0.9 % IV SOLN: INTRAVENOUS | Status: DC | PRN

## 2022-01-28 MED ORDER — ACETAMINOPHEN 500 MG PO TABS: 1000.0000 mg | ORAL_TABLET | Freq: Once | ORAL | Status: DC

## 2022-01-28 MED ORDER — LIDOCAINE HCL (CARDIAC) PF 100 MG/5ML IV SOSY
PREFILLED_SYRINGE | INTRAVENOUS | Status: DC | PRN
  Administered 2022-01-28: 40 mg via INTRAVENOUS

## 2022-01-28 MED ORDER — FENTANYL CITRATE (PF) 100 MCG/2ML IJ SOLN
INTRAMUSCULAR | Status: AC
  Filled 2022-01-28: qty 2

## 2022-01-28 MED ORDER — DIPHENHYDRAMINE HCL 50 MG/ML IJ SOLN: 12.5000 mg | Freq: Four times a day (QID) | INTRAMUSCULAR | Status: DC | PRN

## 2022-01-28 MED ORDER — CHLORHEXIDINE GLUCONATE CLOTH 2 % EX PADS: 6.0000 | MEDICATED_PAD | Freq: Once | CUTANEOUS | Status: DC

## 2022-01-28 MED ORDER — ACETAMINOPHEN 500 MG PO TABS
1000.0000 mg | ORAL_TABLET | Freq: Four times a day (QID) | ORAL | Status: DC | PRN
  Administered 2022-01-28 – 2022-02-01 (×3): 1000 mg via ORAL
  Filled 2022-01-28 (×3): qty 2

## 2022-01-28 MED ORDER — FENTANYL CITRATE (PF) 100 MCG/2ML IJ SOLN
50.0000 ug | Freq: Once | INTRAMUSCULAR | Status: AC
  Administered 2022-01-28: 50 ug via INTRAVENOUS

## 2022-01-28 MED ORDER — LIDOCAINE-EPINEPHRINE (PF) 1 %-1:200000 IJ SOLN
INTRAMUSCULAR | Status: DC | PRN
  Administered 2022-01-28: 14 mL

## 2022-01-28 MED ORDER — ACETAMINOPHEN 500 MG PO TABS
1000.0000 mg | ORAL_TABLET | ORAL | Status: AC
  Administered 2022-01-28: 1000 mg via ORAL

## 2022-01-28 MED ORDER — EPINEPHRINE 0.3 MG/0.3ML IJ SOAJ: 0.3000 mg | INTRAMUSCULAR | Status: DC | PRN

## 2022-01-28 MED ORDER — HYDROMORPHONE HCL 1 MG/ML PO LIQD: 1.0000 mg | Freq: Four times a day (QID) | ORAL | Status: DC | PRN

## 2022-01-28 MED ORDER — CEFAZOLIN SODIUM-DEXTROSE 2-4 GM/100ML-% IV SOLN
2.0000 g | INTRAVENOUS | Status: AC
  Administered 2022-01-28: 2 g via INTRAVENOUS

## 2022-01-28 MED ORDER — LORAZEPAM 0.5 MG PO TABS
0.2500 mg | ORAL_TABLET | Freq: Two times a day (BID) | ORAL | Status: DC | PRN
  Administered 2022-02-01: 0.25 mg via ORAL
  Administered 2022-02-03: 0.5 mg via ORAL
  Filled 2022-01-28 (×4): qty 1

## 2022-01-28 MED ORDER — ONDANSETRON HCL 4 MG/2ML IJ SOLN: 4.0000 mg | Freq: Once | INTRAMUSCULAR | Status: DC | PRN

## 2022-01-28 MED ORDER — HYDROMORPHONE HCL 1 MG/ML IJ SOLN: 0.2500 mg | INTRAMUSCULAR | Status: DC | PRN

## 2022-01-28 MED ORDER — ENOXAPARIN SODIUM 40 MG/0.4ML IJ SOSY
40.0000 mg | PREFILLED_SYRINGE | INTRAMUSCULAR | Status: DC
  Administered 2022-01-29 – 2022-02-03 (×6): 40 mg via SUBCUTANEOUS
  Filled 2022-01-28 (×6): qty 0.4

## 2022-01-28 MED ORDER — AMISULPRIDE (ANTIEMETIC) 5 MG/2ML IV SOLN: 10.0000 mg | Freq: Once | INTRAVENOUS | Status: DC | PRN

## 2022-01-28 MED ORDER — MIDAZOLAM HCL 2 MG/2ML IJ SOLN
2.0000 mg | Freq: Once | INTRAMUSCULAR | Status: AC
  Administered 2022-01-28: 1 mg via INTRAVENOUS

## 2022-01-28 MED ORDER — ACETAMINOPHEN 500 MG PO TABS
ORAL_TABLET | ORAL | Status: AC
  Filled 2022-01-28: qty 2

## 2022-01-28 MED ORDER — EPHEDRINE 5 MG/ML INJ
INTRAVENOUS | Status: AC
  Filled 2022-01-28: qty 5

## 2022-01-28 MED ORDER — BUPIVACAINE LIPOSOME 1.3 % IJ SUSP
INTRAMUSCULAR | Status: DC | PRN
  Administered 2022-01-28: 10 mL

## 2022-01-28 MED ORDER — IBUPROFEN 600 MG PO TABS
600.0000 mg | ORAL_TABLET | Freq: Four times a day (QID) | ORAL | Status: DC | PRN
  Administered 2022-01-29 – 2022-01-30 (×4): 600 mg via ORAL
  Filled 2022-01-28 (×4): qty 1

## 2022-01-28 MED ORDER — DEXAMETHASONE SODIUM PHOSPHATE 10 MG/ML IJ SOLN
INTRAMUSCULAR | Status: AC
  Filled 2022-01-28: qty 1

## 2022-01-28 MED ORDER — METHOCARBAMOL 500 MG PO TABS
500.0000 mg | ORAL_TABLET | Freq: Four times a day (QID) | ORAL | Status: DC | PRN
  Administered 2022-01-30 (×2): 500 mg via ORAL
  Filled 2022-01-28 (×2): qty 1

## 2022-01-28 MED ORDER — HYDROMORPHONE HCL 1 MG/ML IJ SOLN
1.0000 mg | Freq: Once | INTRAMUSCULAR | Status: AC
  Administered 2022-01-28: 1 mg via INTRAVENOUS

## 2022-01-28 MED ORDER — ONDANSETRON HCL 4 MG PO TABS: 8.0000 mg | ORAL_TABLET | Freq: Three times a day (TID) | ORAL | Status: DC | PRN

## 2022-01-28 MED ORDER — HYDROMORPHONE HCL 1 MG/ML IJ SOLN
INTRAMUSCULAR | Status: AC
  Filled 2022-01-28: qty 1

## 2022-01-28 MED ORDER — LEVOTHYROXINE SODIUM 88 MCG PO CAPS
88.0000 ug | ORAL_CAPSULE | ORAL | Status: DC
  Administered 2022-01-31 – 2022-02-03 (×3): 88 ug via ORAL
  Filled 2022-01-28 (×18): qty 1

## 2022-01-28 MED ORDER — 0.9 % SODIUM CHLORIDE (POUR BTL) OPTIME
TOPICAL | Status: DC | PRN
  Administered 2022-01-28: 75 mL

## 2022-01-28 MED ORDER — PROCHLORPERAZINE MALEATE 10 MG PO TABS: 10.0000 mg | ORAL_TABLET | Freq: Four times a day (QID) | ORAL | Status: DC | PRN

## 2022-01-28 MED ORDER — FENTANYL CITRATE (PF) 100 MCG/2ML IJ SOLN
INTRAMUSCULAR | Status: DC | PRN
  Administered 2022-01-28: 50 ug via INTRAVENOUS

## 2022-01-28 MED ORDER — ROPIVACAINE HCL 5 MG/ML IJ SOLN
INTRAMUSCULAR | Status: DC | PRN
  Administered 2022-01-28: 20 mL via PERINEURAL

## 2022-01-28 MED ORDER — EPHEDRINE SULFATE (PRESSORS) 50 MG/ML IJ SOLN
INTRAMUSCULAR | Status: DC | PRN
  Administered 2022-01-28 (×2): 10 mg via INTRAVENOUS

## 2022-01-28 MED ORDER — MELATONIN 3 MG PO TABS: 3.0000 mg | ORAL_TABLET | Freq: Every evening | ORAL | Status: DC | PRN

## 2022-01-28 MED ORDER — OXYCODONE HCL 5 MG PO TABS
5.0000 mg | ORAL_TABLET | Freq: Four times a day (QID) | ORAL | Status: DC | PRN
  Administered 2022-01-28 – 2022-02-01 (×5): 5 mg via ORAL
  Filled 2022-01-28 (×5): qty 1

## 2022-01-28 MED ORDER — HEPARIN SOD (PORK) LOCK FLUSH 100 UNIT/ML IV SOLN
INTRAVENOUS | Status: DC | PRN
  Administered 2022-01-28: 500 [IU] via INTRAVENOUS

## 2022-01-28 MED ORDER — PHENYLEPHRINE 80 MCG/ML (10ML) SYRINGE FOR IV PUSH (FOR BLOOD PRESSURE SUPPORT)
PREFILLED_SYRINGE | INTRAVENOUS | Status: AC
  Filled 2022-01-28: qty 10

## 2022-01-28 MED ORDER — SUCCINYLCHOLINE CHLORIDE 200 MG/10ML IV SOSY
PREFILLED_SYRINGE | INTRAVENOUS | Status: AC
  Filled 2022-01-28: qty 10

## 2022-01-28 MED ORDER — DIPHENHYDRAMINE HCL 12.5 MG/5ML PO ELIX: 12.5000 mg | ORAL_SOLUTION | Freq: Four times a day (QID) | ORAL | Status: DC | PRN

## 2022-01-28 SURGICAL SUPPLY — 77 items
ADH SKN CLS APL DERMABOND .7 (GAUZE/BANDAGES/DRESSINGS) ×2
APL PRP STRL LF DISP 70% ISPRP (MISCELLANEOUS) ×2
BAG DECANTER FOR FLEXI CONT (MISCELLANEOUS) ×3 IMPLANT
BINDER BREAST LRG (GAUZE/BANDAGES/DRESSINGS) IMPLANT
BINDER BREAST MEDIUM (GAUZE/BANDAGES/DRESSINGS) IMPLANT
BINDER BREAST XLRG (GAUZE/BANDAGES/DRESSINGS) IMPLANT
BINDER BREAST XXLRG (GAUZE/BANDAGES/DRESSINGS) IMPLANT
BLADE HEX COATED 2.75 (ELECTRODE) ×3 IMPLANT
BLADE SURG 10 STRL SS (BLADE) ×3 IMPLANT
BLADE SURG 11 STRL SS (BLADE) ×3 IMPLANT
BLADE SURG 15 STRL LF DISP TIS (BLADE) ×3 IMPLANT
BLADE SURG 15 STRL SS (BLADE) ×2
BNDG CMPR 5X4 CHSV STRCH STRL (GAUZE/BANDAGES/DRESSINGS) ×2
BNDG COHESIVE 4X5 TAN STRL LF (GAUZE/BANDAGES/DRESSINGS) ×3 IMPLANT
CANISTER SUC SOCK COL 7IN (MISCELLANEOUS) IMPLANT
CANISTER SUCT 1200ML W/VALVE (MISCELLANEOUS) ×3 IMPLANT
CHLORAPREP W/TINT 26 (MISCELLANEOUS) ×3 IMPLANT
CLIP TI LARGE 6 (CLIP) IMPLANT
CLIP TI MEDIUM 6 (CLIP) ×6 IMPLANT
CLIP TI WIDE RED SMALL 6 (CLIP) IMPLANT
COVER BACK TABLE 60X90IN (DRAPES) ×3 IMPLANT
COVER MAYO STAND STRL (DRAPES) ×3 IMPLANT
COVER PROBE W GEL 5X96 (DRAPES) ×3 IMPLANT
DERMABOND ADVANCED .7 DNX12 (GAUZE/BANDAGES/DRESSINGS) ×3 IMPLANT
DRAPE C-ARM 42X72 X-RAY (DRAPES) ×3 IMPLANT
DRAPE LAPAROTOMY TRNSV 102X78 (DRAPES) ×3 IMPLANT
DRAPE UTILITY XL STRL (DRAPES) ×3 IMPLANT
DRSG TEGADERM 4X4.75 (GAUZE/BANDAGES/DRESSINGS) IMPLANT
ELECT COATED BLADE 2.86 ST (ELECTRODE) ×3 IMPLANT
ELECT REM PT RETURN 9FT ADLT (ELECTROSURGICAL) ×2
ELECTRODE REM PT RTRN 9FT ADLT (ELECTROSURGICAL) ×3 IMPLANT
GAUZE 4X4 16PLY ~~LOC~~+RFID DBL (SPONGE) ×3 IMPLANT
GAUZE PAD ABD 8X10 STRL (GAUZE/BANDAGES/DRESSINGS) ×3 IMPLANT
GAUZE SPONGE 4X4 12PLY STRL LF (GAUZE/BANDAGES/DRESSINGS) ×3 IMPLANT
GLOVE BIO SURGEON STRL SZ 6 (GLOVE) ×3 IMPLANT
GLOVE BIOGEL PI IND STRL 6.5 (GLOVE) ×3 IMPLANT
GLOVE BIOGEL PI IND STRL 7.0 (GLOVE) IMPLANT
GLOVE SURG SS PI 6.5 STRL IVOR (GLOVE) IMPLANT
GOWN STRL REUS W/ TWL LRG LVL3 (GOWN DISPOSABLE) ×3 IMPLANT
GOWN STRL REUS W/TWL 2XL LVL3 (GOWN DISPOSABLE) ×3 IMPLANT
GOWN STRL REUS W/TWL LRG LVL3 (GOWN DISPOSABLE) ×2
IV CONNECTOR ONE LINK NDLESS (IV SETS) IMPLANT
KIT MARKER MARGIN INK (KITS) ×3 IMPLANT
KIT PORT POWER 8FR ISP CVUE (Port) IMPLANT
LIGHT WAVEGUIDE WIDE FLAT (MISCELLANEOUS) IMPLANT
NDL HYPO 25X1 1.5 SAFETY (NEEDLE) ×3 IMPLANT
NDL SAFETY ECLIP 18X1.5 (MISCELLANEOUS) ×3 IMPLANT
NEEDLE HYPO 25X1 1.5 SAFETY (NEEDLE) ×4 IMPLANT
NS IRRIG 1000ML POUR BTL (IV SOLUTION) ×3 IMPLANT
PACK BASIN DAY SURGERY FS (CUSTOM PROCEDURE TRAY) ×3 IMPLANT
PACK UNIVERSAL I (CUSTOM PROCEDURE TRAY) ×3 IMPLANT
PENCIL SMOKE EVACUATOR (MISCELLANEOUS) ×3 IMPLANT
SLEEVE SCD COMPRESS KNEE MED (STOCKING) ×3 IMPLANT
SPIKE FLUID TRANSFER (MISCELLANEOUS) IMPLANT
SPONGE T-LAP 18X18 ~~LOC~~+RFID (SPONGE) ×6 IMPLANT
STAPLER VISISTAT 35W (STAPLE) IMPLANT
STOCKINETTE IMPERVIOUS LG (DRAPES) ×3 IMPLANT
STRIP CLOSURE SKIN 1/2X4 (GAUZE/BANDAGES/DRESSINGS) ×3 IMPLANT
SUT ETHILON 2 0 FS 18 (SUTURE) IMPLANT
SUT MNCRL AB 4-0 PS2 18 (SUTURE) ×3 IMPLANT
SUT MON AB 5-0 PS2 18 (SUTURE) IMPLANT
SUT PROLENE 2 0 SH DA (SUTURE) ×6 IMPLANT
SUT SILK 2 0 SH (SUTURE) IMPLANT
SUT VIC AB 2-0 SH 27 (SUTURE) ×2
SUT VIC AB 2-0 SH 27XBRD (SUTURE) ×3 IMPLANT
SUT VIC AB 3-0 SH 27 (SUTURE)
SUT VIC AB 3-0 SH 27X BRD (SUTURE) ×3 IMPLANT
SUT VICRYL 3-0 CR8 SH (SUTURE) IMPLANT
SYR 10ML LL (SYRINGE) ×3 IMPLANT
SYR 5ML LUER SLIP (SYRINGE) ×3 IMPLANT
SYR BULB EAR ULCER 3OZ GRN STR (SYRINGE) ×3 IMPLANT
SYR CONTROL 10ML LL (SYRINGE) ×3 IMPLANT
TOWEL GREEN STERILE FF (TOWEL DISPOSABLE) ×3 IMPLANT
TRACER MAGTRACE VIAL (MISCELLANEOUS) IMPLANT
TRAY FAXITRON CT DISP (TRAY / TRAY PROCEDURE) IMPLANT
TUBE CONNECTING 20X1/4 (TUBING) ×3 IMPLANT
YANKAUER SUCT BULB TIP NO VENT (SUCTIONS) ×3 IMPLANT

## 2022-01-28 NOTE — Anesthesia Procedure Notes (Signed)
  Anesthesia Regional Block: Pectoralis block   Pre-Anesthetic Checklist: , timeout performed,  Correct Patient, Correct Site, Correct Laterality,  Correct Procedure, Correct Position, site marked,  Risks and benefits discussed,  Surgical consent,  Pre-op evaluation,  At surgeon's request and post-op pain management  Laterality: Right  Prep: Maximum Sterile Barrier Precautions used, chloraprep       Needles:  Injection technique: Single-shot  Needle Type: Echogenic Stimulator Needle     Needle Length: 9cm  Needle Gauge: 22     Additional Needles:   Procedures:,,,, ultrasound used (permanent image in chart),,    Narrative:  Start time: 01/28/2022 9:25 AM End time: 01/28/2022 9:30 AM Injection made incrementally with aspirations every 5 mL.  Performed by: Personally  Anesthesiologist: Pervis Hocking, DO  Additional Notes: Monitors applied. No increased pain on injection. No increased resistance to injection. Injection made in 5cc increments. Good needle visualization. Patient tolerated procedure well.

## 2022-01-28 NOTE — Op Note (Signed)
Chest Tube Insertion Procedure Note  Indications:  Clinically significant Pneumothorax  Pre-operative Diagnosis: Pneumothorax  Post-operative Diagnosis: Pneumothorax  Procedure Details  Informed consent was obtained for the procedure, including sedation.    After sterile skin prep, using standard technique, a 14 French tube was placed in the left anterior 5th rib space.  Findings: Rush of air  Estimated Blood Loss:  Minimal         Specimens:  None              Complications:  None; patient tolerated the procedure well.         Disposition: PACU - hemodynamically stable.         Condition: stable  Attending Attestation: I performed the procedure.

## 2022-01-28 NOTE — Progress Notes (Signed)
Assisted Dr. Finucane with right, pectoralis, ultrasound guided block. Side rails up, monitors on throughout procedure. See vital signs in flow sheet. Tolerated Procedure well. 

## 2022-01-28 NOTE — Anesthesia Preprocedure Evaluation (Addendum)
Anesthesia Evaluation  Patient identified by MRN, date of birth, ID band Patient awake    Reviewed: Allergy & Precautions, NPO status , Patient's Chart, lab work & pertinent test results  Airway Mallampati: I  TM Distance: >3 FB Neck ROM: Full    Dental no notable dental hx. (+) Teeth Intact, Dental Advisory Given   Pulmonary neg pulmonary ROS,    Pulmonary exam normal breath sounds clear to auscultation       Cardiovascular negative cardio ROS Normal cardiovascular exam Rhythm:Regular Rate:Normal     Neuro/Psych PSYCHIATRIC DISORDERS Anxiety Depression negative neurological ROS     GI/Hepatic Neg liver ROS, GERD  Controlled,  Endo/Other  Hypothyroidism   Renal/GU negative Renal ROS  negative genitourinary   Musculoskeletal  (+) Arthritis , Osteoarthritis,    Abdominal   Peds  Hematology negative hematology ROS (+)   Anesthesia Other Findings   Reproductive/Obstetrics negative OB ROS                            Anesthesia Physical Anesthesia Plan  ASA: 2  Anesthesia Plan: General and Regional   Post-op Pain Management: Regional block* and Tylenol PO (pre-op)*   Induction: Intravenous  PONV Risk Score and Plan: 3 and Ondansetron, Dexamethasone, Midazolam and Treatment may vary due to age or medical condition  Airway Management Planned: LMA  Additional Equipment:   Intra-op Plan:   Post-operative Plan: Extubation in OR  Informed Consent: I have reviewed the patients History and Physical, chart, labs and discussed the procedure including the risks, benefits and alternatives for the proposed anesthesia with the patient or authorized representative who has indicated his/her understanding and acceptance.     Dental advisory given  Plan Discussed with: CRNA  Anesthesia Plan Comments:        Anesthesia Quick Evaluation

## 2022-01-28 NOTE — Op Note (Signed)
PREOPERATIVE DIAGNOSIS:  Right breast cancer, pT1bNx     POSTOPERATIVE DIAGNOSIS:  Same     PROCEDURE: Left subclavian port placement, Bard ClearVue Power Port, MRI safe, 8-French and right sentinel node biopsy     SURGEON:  Stark Klein, MD      ANESTHESIA:  General   FINDINGS:  Good venous return, easy flush, and tip of the catheter and   SVC 23.5 cm. Two sentinel nodes     SPECIMEN:  None.      ESTIMATED BLOOD LOSS:  Minimal.      COMPLICATIONS:  None known.      PROCEDURE:  Pt was identified in the holding area and taken to   the operating room, where patient was placed supine on the operating room   table.  General anesthesia was induced.  Patient's left arm was tucked and the upper   chest and neck were prepped and draped in sterile fashion.  The right breast/axilla and upper arm were also prepped out.  Time-out was performed according to the surgical safety check list.  When all was   correct, we continued.   The mag trace was injected into the right subareolar position and massaged for 5 minutes.   Local anesthetic was administered at the angle of the clavicle.  The vein was accessed with one pass(es) of the needle. There was good venous return and the wire passed easily with no ectopy.   Fluoroscopy was used to confirm that the wire was in the vena cava.      The patient was placed back level and the area for the pocket was anethetized   with local anesthetic.  A 3-cm transverse incision was made with a #15   blade.  Cautery was used to divide the subcutaneous tissues down to the   pectoralis muscle.  An [employer] retractor was used to elevate the skin   while a pocket was created on top of the pectoralis fascia.  The port   was placed into the pocket to confirm that it was of adequate size.  The   catheter was preattached to the port.  The port was then secured to the   pectoralis fascia with four 2-0 Prolene sutures.  These were clamped and   not tied down yet.    The  catheter was tunneled through to the wire exit   site.  The catheter was placed along the wire to determine what length it should be to be in the SVC.  The catheter was cut at 23.5 cm.  The tunneler sheath and dilator were passed over the wire and the dilator and wire were removed.  The catheter was advanced through the tunneler sheath and the tunneler sheath was pulled away.  Care was taken to keep the catheter in the tunneler sheath as this occurred. This was advanced and the tunneler sheath was removed.  There was good venous   return and easy flush of the catheter.  The Prolene sutures were tied   down to the pectoral fascia.  The skin was reapproximated using 3-0   Vicryl interrupted deep dermal sutures.    Fluoroscopy was used to re-confirm good position of the catheter.  The skin   was then closed using 4-0 Monocryl in a subcuticular fashion.  The port was flushed with concentrated heparin flush as well.  The wounds were then cleaned, dried, and dressed with Dermabond.    The right axilla was then addressed.  A curvilinear incision was  made below the hair line.  The sentimag probe was used to identify two deep left sentinel nodes.  Counts per second were 2900, 190, and background 50.  Lymphovascular channels were clipped with medium clips.  The cavity was irrigated.  The incision was closed with 3-0 vicryl deep dermal sutures and 4-0 monocryl running subcuticular suture.    The patient was awakened from anesthesia and taken to the PACU in stable condition.  Needle, sponge, and instrument counts were correct.               Stark Klein, MD

## 2022-01-28 NOTE — Transfer of Care (Signed)
Immediate Anesthesia Transfer of Care Note  Patient: Samantha [name]  Procedure(s) Performed: RIGHT SENTINEL LYMPH NODE BIOPSY (Right: Axilla) INSERTION PORT-A-CATH (Left: Chest)  Patient Location: PACU  Anesthesia Type:General  Level of Consciousness: sedated  Airway & Oxygen Therapy: Patient Spontanous Breathing and Patient connected to face mask oxygen  Post-op Assessment: Report given to RN and Post -op Vital signs reviewed and stable  Post vital signs: Reviewed and stable  Last Vitals:  Vitals Value Taken Time  BP    Temp    Pulse    Resp    SpO2      Last Pain:  Vitals:   01/28/22 0814  TempSrc: Oral  PainSc: 3       Patients Stated Pain Goal: 3 (33/74/45 1460)  Complications: No notable events documented.

## 2022-01-28 NOTE — Discharge Instructions (Addendum)
[place] Office Phone Number [phone]  BREAST BIOPSY/ PARTIAL MASTECTOMY: POST OP INSTRUCTIONS  Always review your discharge instruction sheet given to you by the facility where your surgery was performed.  IF YOU HAVE DISABILITY OR FAMILY LEAVE FORMS, YOU MUST BRING THEM TO THE OFFICE FOR PROCESSING.  DO NOT GIVE THEM TO YOUR DOCTOR.  Take 2 tylenol (acetominophen) three times a day for 3 days.  If you still have pain, add ibuprofen with food in between if able to take this (if you have kidney issues or stomach issues, do not take ibuprofen).  If both of those are not enough, add the narcotic pain pill.  If you find you are needing a lot of this overnight after surgery, call the next morning for a refill.    Prescriptions will not be filled after 5pm or on week-ends. Take your usually prescribed medications unless otherwise directed You should eat very light the first 24 hours after surgery, such as soup, crackers, pudding, etc.  Resume your normal diet the day after surgery. Most patients will experience some swelling and bruising in the breast.  Ice packs and a good support bra will help.  Swelling and bruising can take several days to resolve.  It is common to experience some constipation if taking pain medication after surgery.  Increasing fluid intake and taking a stool softener will usually help or prevent this problem from occurring.  A mild laxative (Milk of Magnesia or Miralax) should be taken according to package directions if there are no bowel movements after 48 hours. Unless discharge instructions indicate otherwise, you may remove your bandages 48 hours after surgery, and you may shower at that time.  You may have steri-strips (small skin tapes) in place directly over the incision.  These strips should be left on the skin at least for for 7-10 days.    ACTIVITIES:  You may resume regular daily activities (gradually increasing) beginning the next day.  Wearing a  good support bra or sports bra (or the breast binder) minimizes pain and swelling.  You may have sexual intercourse when it is comfortable. No heavy lifting for 1-2 weeks (not over around 10 pounds).  You may drive when you no longer are taking prescription pain medication, you can comfortably wear a seatbelt, and you can safely maneuver your car and apply brakes. RETURN TO WORK:  __________3-14 days depending on job. _______________ Dennis Bast should see your doctor in the office for a follow-up appointment approximately two weeks after your surgery.  Your doctor's nurse will typically make your follow-up appointment when she calls you with your pathology report.  Expect your pathology report 3-4 business days after your surgery.  You may call to check if you do not hear from [place] after three days.   WHEN TO CALL YOUR DOCTOR: Fever over 101.0 Nausea and/or vomiting. Extreme swelling or bruising. Continued bleeding from incision. Increased pain, redness, or drainage from the incision.  The clinic staff is available to answer your questions during regular business hours.  Please don't hesitate to call and ask to speak to one of the nurses for clinical concerns.  If you have a medical emergency, go to the nearest emergency room or call 911.  A surgeon from [facility] is always on call at the hospital.  For further questions, please visit [email or web address]   You can have Tylenol again at 2:20pm.

## 2022-01-28 NOTE — Anesthesia Procedure Notes (Signed)
Procedure Name: LMA Insertion Date/Time: 01/28/2022 10:34 AM  Performed by: Willa Frater, CRNAPre-anesthesia Checklist: Patient identified, Emergency Drugs available, Suction available and Patient being monitored Patient Re-evaluated:Patient Re-evaluated prior to induction Oxygen Delivery Method: Circle System Utilized Preoxygenation: Pre-oxygenation with 100% oxygen Induction Type: IV induction Ventilation: Mask ventilation without difficulty LMA: LMA inserted LMA Size: 4.0 Number of attempts: 1 Airway Equipment and Method: bite block Placement Confirmation: positive ETCO2 Tube secured with: Tape Dental Injury: Teeth and Oropharynx as per pre-operative assessment

## 2022-01-28 NOTE — Progress Notes (Signed)
Called Dr. Barry Dienes with chest x ray results. Will repeat chest x ray a 1430

## 2022-01-28 NOTE — Progress Notes (Signed)
Pt had left ptx on post op CXR.  Vitals were normal on room air.  However, 2 hour delayed film showed slight increase of size of CXR.  Pt brought from day surgery to main OR PACU. Chest tube placed.  Patient tolerated procedure well.

## 2022-01-28 NOTE — Interval H&P Note (Signed)
History and Physical Interval Note:  01/28/2022 9:51 AM  Samantha [name]  has presented today for surgery, with the diagnosis of RIGHT BREAST CANCER.  She was found to have invasive cancer and needs chemotherapy.   The various methods of treatment have been discussed with the patient and family. After consideration of risks, benefits and other options for treatment, the patient has consented to  Procedure(s): RIGHT SENTINEL LYMPH NODE BIOPSY (Right) INSERTION PORT-A-CATH (Left) as a surgical intervention.  The patient's history has been reviewed, patient examined, no change in status, stable for surgery.  I have reviewed the patient's chart and labs.  Questions were answered to the patient's satisfaction.     Stark Klein

## 2022-01-29 ENCOUNTER — Observation Stay (HOSPITAL_COMMUNITY): Payer: Managed Care, Other (non HMO)

## 2022-01-29 ENCOUNTER — Encounter (HOSPITAL_BASED_OUTPATIENT_CLINIC_OR_DEPARTMENT_OTHER): Payer: Self-pay | Admitting: General Surgery

## 2022-01-29 DIAGNOSIS — Z79890 Hormone replacement therapy (postmenopausal): Secondary | ICD-10-CM | POA: Diagnosis not present

## 2022-01-29 DIAGNOSIS — Z882 Allergy status to sulfonamides status: Secondary | ICD-10-CM | POA: Diagnosis not present

## 2022-01-29 DIAGNOSIS — M1990 Unspecified osteoarthritis, unspecified site: Secondary | ICD-10-CM | POA: Diagnosis present

## 2022-01-29 DIAGNOSIS — Z888 Allergy status to other drugs, medicaments and biological substances status: Secondary | ICD-10-CM | POA: Diagnosis not present

## 2022-01-29 DIAGNOSIS — F419 Anxiety disorder, unspecified: Secondary | ICD-10-CM | POA: Diagnosis present

## 2022-01-29 DIAGNOSIS — E039 Hypothyroidism, unspecified: Secondary | ICD-10-CM | POA: Diagnosis present

## 2022-01-29 DIAGNOSIS — Z853 Personal history of malignant neoplasm of breast: Secondary | ICD-10-CM | POA: Diagnosis not present

## 2022-01-29 DIAGNOSIS — M5412 Radiculopathy, cervical region: Secondary | ICD-10-CM | POA: Diagnosis present

## 2022-01-29 DIAGNOSIS — Z79899 Other long term (current) drug therapy: Secondary | ICD-10-CM | POA: Diagnosis not present

## 2022-01-29 DIAGNOSIS — H9041 Sensorineural hearing loss, unilateral, right ear, with unrestricted hearing on the contralateral side: Secondary | ICD-10-CM | POA: Diagnosis present

## 2022-01-29 DIAGNOSIS — Z883 Allergy status to other anti-infective agents status: Secondary | ICD-10-CM | POA: Diagnosis not present

## 2022-01-29 DIAGNOSIS — Z171 Estrogen receptor negative status [ER-]: Secondary | ICD-10-CM | POA: Diagnosis not present

## 2022-01-29 DIAGNOSIS — J95811 Postprocedural pneumothorax: Secondary | ICD-10-CM | POA: Diagnosis not present

## 2022-01-29 DIAGNOSIS — M47816 Spondylosis without myelopathy or radiculopathy, lumbar region: Secondary | ICD-10-CM | POA: Diagnosis present

## 2022-01-29 DIAGNOSIS — C50411 Malignant neoplasm of upper-outer quadrant of right female breast: Secondary | ICD-10-CM | POA: Diagnosis present

## 2022-01-29 DIAGNOSIS — K219 Gastro-esophageal reflux disease without esophagitis: Secondary | ICD-10-CM | POA: Diagnosis present

## 2022-01-29 LAB — SURGICAL PATHOLOGY

## 2022-01-29 NOTE — Progress Notes (Signed)
Mobility Specialist - Progress Note   01/29/22 0935  Mobility  Activity Transferred from bed to chair  Level of Assistance Standby assist, set-up cues, supervision of patient - no hands on  Assistive Device None  Distance Ambulated (ft) 10 ft  Activity Response Tolerated well  $Mobility charge 1 Mobility    Pt received in bed requesting to sit in recliner. Left in recliner w/ call bell and all needs met.   Paulla Dolly Mobility Specialist

## 2022-01-29 NOTE — Plan of Care (Signed)

## 2022-01-29 NOTE — Progress Notes (Signed)
Mobility Specialist - Progress Note   01/29/22 1005  Mobility  Activity Transferred from chair to bed  Level of Assistance Standby assist, set-up cues, supervision of patient - no hands on  Assistive Device None  Distance Ambulated (ft) 10 ft  Activity Response Tolerated well  $Mobility charge 1 Mobility    Pt received in recliner requesting to go back to bed. Left in bed w/ call bell in reach and all needs met.   Paulla Dolly Mobility Specialist

## 2022-01-29 NOTE — Care Management (Signed)
  Transition of Care (TOC) Screening Note   Patient Details  Name: Samantha [name] Date of Birth: 01-22-1961   Transition of Care [facility]) CM/SW Contact:    Carles Collet, RN Phone Number: 01/29/2022, 4:06 PM    Transition of Care Department [facility]) has reviewed patient and no TOC needs have been identified at this time. We will continue to monitor patient advancement through interdisciplinary progression rounds. If new patient transition needs arise, please place a TOC consult.

## 2022-01-29 NOTE — Anesthesia Postprocedure Evaluation (Signed)
Anesthesia Post Note  Patient: CERI MAYER  Procedure(s) Performed: RIGHT SENTINEL LYMPH NODE BIOPSY (Right: Axilla) INSERTION PORT-A-CATH (Left: Chest)     Patient location during evaluation: PACU Anesthesia Type: Regional and General Level of consciousness: awake and alert Pain management: pain level controlled Vital Signs Assessment: post-procedure vital signs reviewed and stable Respiratory status: spontaneous breathing, nonlabored ventilation, respiratory function stable and patient connected to nasal cannula oxygen Cardiovascular status: blood pressure returned to baseline and stable Postop Assessment: no apparent nausea or vomiting Anesthetic complications: no Comments: - patient with left sided PTX requiring chest tube placement per surgeon. Hemodynamically stable in PACU prior to placement   No notable events documented.  Last Vitals:  Vitals:   01/29/22 0532 01/29/22 0803  BP: (!) 108/59 112/79  Pulse: 76 85  Resp: 16 16  Temp: 36.4 C 36.7 C  SpO2: 97% 96%    Last Pain:  Vitals:   01/29/22 0803  TempSrc: Oral  PainSc:                  March Rummage Delshon Blanchfield

## 2022-01-30 ENCOUNTER — Inpatient Hospital Stay (HOSPITAL_COMMUNITY): Payer: Managed Care, Other (non HMO)

## 2022-01-30 ENCOUNTER — Other Ambulatory Visit: Payer: Self-pay

## 2022-01-30 NOTE — Progress Notes (Signed)
2 Days Post-Op   Subjective/Chief Complaint: Pain tolerable.  No SOB. Repeat CXR this AM shows PTX smaller than yesterday pm but not gone.  Some soreness right axilla, but not as much as they thought.     Objective: Vital signs in last 24 hours: Temp:  [97.6 F (36.4 C)-98.8 F (37.1 C)] 98.1 F (36.7 C) (10/19 0733) Pulse Rate:  [76-91] 83 (10/19 0733) Resp:  [16-17] 16 (10/19 0733) BP: (104-130)/(57-72) 130/72 (10/19 0733) SpO2:  [93 %-95 %] 93 % (10/19 0733) Last BM Date : 01/27/22  Intake/Output from previous day: 10/18 0701 - 10/19 0700 In: 600 [P.O.:600] Out: 18 [Chest Tube:18] Intake/Output this shift: No intake/output data recorded.  General appearance: alert, cooperative, and no distress Resp: breathing comfortably Incisions in axilla and left chest  Lab Results:  Recent Labs    01/28/22 1819  WBC 10.7*  HGB 14.1  HCT 42.1  PLT 282   BMET Recent Labs    01/28/22 1819  CREATININE 0.70   PT/INR No results for input(s): "LABPROT", "INR" in the last 72 hours. ABG No results for input(s): "PHART", "HCO3" in the last 72 hours.  Invalid input(s): "PCO2", "PO2"  Studies/Results: DG CHEST PORT 1 VIEW  Result Date: 01/30/2022 CLINICAL DATA:  Left-sided chest tube. EXAM: PORTABLE CHEST 1 VIEW COMPARISON:  January 29, 2022. FINDINGS: Stable cardiomediastinal silhouette. Left subclavian Port-A-Cath is unchanged. Moderate size left apical pneumothorax is noted which is slightly decreased compared to prior exam. Left-sided chest tube is unchanged. Mild left basilar atelectasis is noted. IMPRESSION: Moderate left apical pneumothorax is noted which is slightly decreased compared to prior exam. Left-sided chest tube is unchanged. Electronically Signed   By: Marijo Conception M.D.   On: 01/30/2022 08:12   DG CHEST PORT 1 VIEW  Result Date: 01/29/2022 CLINICAL DATA:  Chest tube to water seal. EXAM: PORTABLE CHEST 1 VIEW COMPARISON:  Same day at 0539 hours. FINDINGS:  Trachea is midline. Heart size stable. Left subclavian Port-A-Cath terminates in the SVC. Small bore chest tube remains in the left hemithorax. Moderate left pneumothorax with apical and basilar components. Left perihilar and bibasilar atelectasis, left greater than right. Favor atelectasis in the right costophrenic angle rather than pleural fluid. Surgical clips in the right axilla and right breast. IMPRESSION: 1. Moderate left pneumothorax after chest tube placement to water seal. This was discussed with Dr. Stark Klein prior to dictation. 2. Left perihilar and bibasilar atelectasis. Electronically Signed   By: Lorin Picket M.D.   On: 01/29/2022 13:15   DG CHEST PORT 1 VIEW  Result Date: 01/29/2022 CLINICAL DATA:  [id number].  Follow-up chest tube in place. EXAM: PORTABLE CHEST 1 VIEW COMPARISON:  Portable chest yesterday at 4:54 p.m. FINDINGS: 5:39 a.m. There is no appreciable left pneumothorax with pigtail left chest tube distal end in the lateral upper lung field. Left subclavian port catheter again terminates in the distal SVC. Right lateral chest wall surgical clips and scattered right chest wall emphysema are again noted with slightly improved soft tissue emphysema. Stable mediastinal configuration with slight aortic tortuosity. The cardiac size is normal. There are linear atelectatic bands in the left lower lung field but no focal pneumonia is evident. Remaining lungs are generally clear.  Thoracic cage is intact. IMPRESSION: Stable overall aeration. No pneumothorax is seen, no change in left chest tube positioning and basilar atelectasis. No new abnormality. Electronically Signed   By: Telford Nab M.D.   On: 01/29/2022 07:47   DG CHEST PORT  1 VIEW  Result Date: 01/28/2022 CLINICAL DATA:  Spontaneous pneumothorax EXAM: PORTABLE CHEST 1 VIEW COMPARISON:  Chest x-ray 01/28/2022 FINDINGS: New left-sided chest tube is present with distal tip in the upper left hemithorax. Left chest port catheter  tip projects over the SVC. There has been interval re-expansion of the left lung. No pneumothorax visualized. There is some atelectasis in the left lung base. The right lung is clear. The cardiomediastinal silhouette is within normal limits. Surgical clips are seen in the right axillary region. Mild right chest wall emphysema persists. The osseous structures are stable. IMPRESSION: New left-sided chest tube with interval re-expansion of the left lung. No pneumothorax visualized. Electronically Signed   By: Ronney Asters M.D.   On: 01/28/2022 17:23   DG Chest Port 1 View  Result Date: 01/28/2022 CLINICAL DATA:  Port-A-Cath placement. EXAM: PORTABLE CHEST 1 VIEW COMPARISON:  January 28, 2022 at 12:38 p.m. FINDINGS: LEFT-sided Port-A-Cath enters via subclavian approach terminating in the mid to low SVC with similar appearance to prior imaging. Image rotated slightly to the RIGHT. Accounting for this cardiomediastinal contours and hilar structures are stable. The moderate size LEFT pneumothorax is mild to moderately increased in size in the LEFT lower chest accounting for similar to slight increase in general expansion of the chest when compared to the previous study. No current signs of tension physiology. RIGHT lung is clear. On limited assessment no acute skeletal findings. IMPRESSION: Enlarging moderate LEFT-sided pneumothorax mild to moderately increased in size accounting for rotation to the RIGHT seen on the current study, no current signs of tension physiology. These results were called by telephone at the time of interpretation on 01/28/2022 at 2:42 pm to provider Stark Klein , who verbally acknowledged these results. Electronically Signed   By: Zetta Bills M.D.   On: 01/28/2022 14:42   DG CHEST PORT 1 VIEW  Result Date: 01/28/2022 CLINICAL DATA:  Status post port placement. EXAM: PORTABLE CHEST 1 VIEW COMPARISON:  11/27/2014CXR FINDINGS: Left-sided port in place with the tip in the mid SVC. There  is a moderate-sized left-sided pneumothorax. No pleural effusion. Unchanged cardiac and mediastinal contours when accounting for differences in technique. No focal airspace opacity. No displaced rib fractures. Surgical clips along the right chest wall. IMPRESSION: Left-sided port in place with the tip in the mid SVC. There is a moderate-sized left pneumothorax. These results will be called to the ordering clinician or representative by the Radiologist Assistant, and communication documented in the PACS or [employer]. Electronically Signed   By: Marin Roberts M.D.   On: 01/28/2022 13:06   DG C-Arm 1-60 Min-No Report  Result Date: 01/28/2022 Fluoroscopy was utilized by the requesting physician.  No radiographic interpretation.    Anti-infectives: Anti-infectives (From admission, onward)    Start     Dose/Rate Route Frequency Ordered Stop   01/28/22 0800  ceFAZolin (ANCEF) IVPB 2g/100 mL premix        2 g 200 mL/hr over 30 Minutes Intravenous On call to O.R. 01/28/22 0757 01/28/22 1020       Assessment/Plan: s/p Procedure(s): RIGHT SENTINEL LYMPH NODE BIOPSY (Right) INSERTION PORT-A-CATH (Left) Left tube thoracostomy  Lung dropped again. CT suction increase. Incentive spirometry.   Hope to move to water seal tomorrow.     LOS: 1 day    Stark Klein 01/30/2022

## 2022-01-30 NOTE — Progress Notes (Signed)
Mobility Specialist - Progress Note   01/30/22 1159  Mobility  Activity Ambulated independently in hallway  Level of Assistance Independent  Assistive Device None  Distance Ambulated (ft) 200 ft  Activity Response Tolerated well  $Mobility charge 1 Mobility    Pt received in bed agreeable to mobility. Left in bed w/ call bell in reach and all needs met.   Paulla Dolly Mobility Specialist

## 2022-01-30 NOTE — Progress Notes (Signed)
Delayed entry:  Pt with some pain in left chest.  CXR this AM looks good without PTX.    AFVSS  Left chest and port sites look good.  Right breast cancer Left PTX  Move to water seal Repeat CXR this afternoon. If no PTX, pull tube, repeat film and d/c. If lung drops, go back to suction.

## 2022-01-30 NOTE — Progress Notes (Signed)
Air leak detected after patient ambulated to the bathroom with staff member. Md contacted. Tubing taped, dressing redone, chest x ray ordered stat

## 2022-01-30 NOTE — Progress Notes (Addendum)
Patient chest tube suction was increased to 30cm as per order by MD

## 2022-01-31 ENCOUNTER — Inpatient Hospital Stay (HOSPITAL_COMMUNITY): Payer: Managed Care, Other (non HMO)

## 2022-01-31 MED ORDER — FAMOTIDINE 20 MG PO TABS
20.0000 mg | ORAL_TABLET | Freq: Every day | ORAL | Status: DC
  Administered 2022-01-31 – 2022-02-03 (×4): 20 mg via ORAL
  Filled 2022-01-31 (×4): qty 1

## 2022-01-31 MED ORDER — CALCIUM CARBONATE ANTACID 500 MG PO CHEW: 2.0000 | CHEWABLE_TABLET | Freq: Four times a day (QID) | ORAL | Status: DC | PRN

## 2022-01-31 NOTE — Progress Notes (Signed)
3 Days Post-Op   Subjective/Chief Complaint: CXR this AM improved from yesterday.  Had an episode last night where they thought there was an air leak.  Repeat CXR showed slight increase in PTX.     Objective: Vital signs in last 24 hours: Temp:  [98.2 F (36.8 C)-98.5 F (36.9 C)] 98.2 F (36.8 C) (10/20 0836) Pulse Rate:  [72-102] 88 (10/20 0836) Resp:  [17] 17 (10/20 0836) BP: (115-122)/(65-81) 117/65 (10/20 0836) SpO2:  [93 %-98 %] 98 % (10/20 0836) Last BM Date : 01/27/22  Intake/Output from previous day: 10/19 0701 - 10/20 0700 In: 240 [P.O.:240] Out: -  Intake/Output this shift: Total I/O In: 360 [P.O.:360] Out: -   General appearance: alert, cooperative, and no distress Resp: breathing comfortably Incisions in axilla and left chest  Lab Results:  Recent Labs    01/28/22 1819  WBC 10.7*  HGB 14.1  HCT 42.1  PLT 282   BMET Recent Labs    01/28/22 1819  CREATININE 0.70   PT/INR No results for input(s): "LABPROT", "INR" in the last 72 hours. ABG No results for input(s): "PHART", "HCO3" in the last 72 hours.  Invalid input(s): "PCO2", "PO2"  Studies/Results: DG CHEST PORT 1 VIEW  Result Date: 01/31/2022 CLINICAL DATA:  Follow pneumothorax EXAM: PORTABLE CHEST 1 VIEW COMPARISON:  Chest radiograph 02/01/2019 FINDINGS: Marked interval increase in size of the left-sided pneumothorax, now large. Persistent left lung atelectasis. Pleural drainage catheter in place. Unchanged positioning of the port. Right chest wall surgical clips. No displaced rib fractures. IMPRESSION: Marked interval increase in size of the left-sided pneumothorax, now large. These results will be called to the ordering clinician or representative by the Radiologist Assistant, and communication documented in the PACS or [employer]. Electronically Signed   By: Marin Roberts M.D.   On: 01/31/2022 14:32   DG CHEST PORT 1 VIEW  Result Date: 01/31/2022 CLINICAL DATA:  Chest tube  placement, pneumothorax EXAM: PORTABLE CHEST 1 VIEW COMPARISON:  Chest radiograph 01/30/2022 FINDINGS: LEFT apical pneumothorax is decreased in volume with the pleural edge measuring 20 mm from the apical chest wall compared to 30 mm. Small-caliber chest tube in the LEFT midlung unchanged. Port in the anterior chest wall with tip in distal SVC. LEFT basilar atelectasis unchanged. IMPRESSION: 1. Reduction in volume of LEFT pneumothorax. 2. Chest tube in place. 3. LEFT basilar atelectasis. Electronically Signed   By: Suzy Bouchard M.D.   On: 01/31/2022 10:06   DG CHEST PORT 1 VIEW  Result Date: 01/30/2022 CLINICAL DATA:  Complication of chest tube. EXAM: PORTABLE CHEST 1 VIEW COMPARISON:  Radiograph earlier today. FINDINGS: Pigtail catheter in the right hemithorax projects over the mid lung. Left pneumothorax is slightly increased in size from prior exam, measuring between the posterior fourth and fifth ribs. Stable left lung base atelectasis. Left chest port remains in place. The right lung is clear. Stable heart size and mediastinal contours. IMPRESSION: 1. Left pigtail catheter remains in place. Left pneumothorax is minimally increased in size from prior exam. 2. Stable left lung base atelectasis. Electronically Signed   By: Keith Rake M.D.   On: 01/30/2022 20:15   DG CHEST PORT 1 VIEW  Result Date: 01/30/2022 CLINICAL DATA:  Left-sided chest tube. EXAM: PORTABLE CHEST 1 VIEW COMPARISON:  January 29, 2022. FINDINGS: Stable cardiomediastinal silhouette. Left subclavian Port-A-Cath is unchanged. Moderate size left apical pneumothorax is noted which is slightly decreased compared to prior exam. Left-sided chest tube is unchanged. Mild left basilar atelectasis  is noted. IMPRESSION: Moderate left apical pneumothorax is noted which is slightly decreased compared to prior exam. Left-sided chest tube is unchanged. Electronically Signed   By: Marijo Conception M.D.   On: 01/30/2022 08:12     Anti-infectives: Anti-infectives (From admission, onward)    Start     Dose/Rate Route Frequency Ordered Stop   01/28/22 0800  ceFAZolin (ANCEF) IVPB 2g/100 mL premix        2 g 200 mL/hr over 30 Minutes Intravenous On call to O.R. 01/28/22 0757 01/28/22 1020       Assessment/Plan: s/p Procedure(s): RIGHT SENTINEL LYMPH NODE BIOPSY (Right) INSERTION PORT-A-CATH (Left) Left tube thoracostomy  CXR this AM looked better.  Place back to water seal.  Recheck film this afternoon.     LOS: 2 days    Stark Klein 01/31/2022

## 2022-01-31 NOTE — Plan of Care (Signed)

## 2022-01-31 NOTE — Progress Notes (Signed)
Pt has had no output from chest tube. Dr. Dema Severin notified. No orders received.

## 2022-01-31 NOTE — Progress Notes (Signed)
Mobility Specialist - Progress Note   01/31/22 1043  Mobility  Activity Ambulated with assistance in hallway  Level of Assistance Standby assist, set-up cues, supervision of patient - no hands on  Assistive Device None  Distance Ambulated (ft) 500 ft  Activity Response Tolerated well  Mobility Referral Yes  $Mobility charge 1 Mobility    Pre-mobility: 98%SpO2 Post-mobility: 96%SpO2  Pt received at EOB and agreeable. No complaints throughout. Pt returned to EOB with all needs met.   Larey Seat

## 2022-01-31 NOTE — TOC CM/SW Note (Signed)
  Transition of Care (TOC) Screening Note   Patient Details  Name: Samantha [name] Date of Birth: 1960/08/17       Transition of Care Department [facility]) has reviewed patient and no TOC needs have been identified at this time. We will continue to monitor patient advancement through interdisciplinary progression rounds. If new patient transition needs arise, please place a TOC consult.

## 2022-01-31 NOTE — Progress Notes (Signed)
Patient hooked back to suction and an audible whistle was heard. RR nurse called and came to  bedside to help troubleshoot the tube. Connections checked at distal and medial ends, whistle still present. Connection tightened at proximal end and whistling has stopped. Patient ambulated with RR nurse.

## 2022-02-01 ENCOUNTER — Inpatient Hospital Stay (HOSPITAL_COMMUNITY): Payer: Managed Care, Other (non HMO)

## 2022-02-01 NOTE — Progress Notes (Signed)
4 Days Post-Op   Subjective/Chief Complaint: Patient without complaint denies difficulty breathing, shortness of breath or chest pain.   Objective: Vital signs in last 24 hours: Temp:  [98.4 F (36.9 C)] 98.4 F (36.9 C) (10/21 0807) Pulse Rate:  [86-96] 96 (10/21 0807) Resp:  [17-20] 17 (10/21 0807) BP: (120-125)/(66-75) 120/66 (10/21 0807) SpO2:  [92 %-95 %] 94 % (10/21 0807) Last BM Date : 01/27/22  Intake/Output from previous day: 10/20 0701 - 10/21 0700 In: 360 [P.O.:360] Out: 0  Intake/Output this shift: Total I/O In: 400 [P.O.:400] Out: -   Port site clean dry intact left upper chest.  No subcutaneous air.  Lung sounds decreased on left.  Chest tube noted.  Lab Results:  No results for input(s): "WBC", "HGB", "HCT", "PLT" in the last 72 hours. BMET No results for input(s): "NA", "K", "CL", "CO2", "GLUCOSE", "BUN", "CREATININE", "CALCIUM" in the last 72 hours. PT/INR No results for input(s): "LABPROT", "INR" in the last 72 hours. ABG No results for input(s): "PHART", "HCO3" in the last 72 hours.  Invalid input(s): "PCO2", "PO2"  Studies/Results: DG Chest Port 1 View  Result Date: 02/01/2022 CLINICAL DATA:  Pneumothorax EXAM: PORTABLE CHEST 1 VIEW COMPARISON:  01/31/2022 FINDINGS: Left-sided chest tube positioned within the upper to mid left chest. Large left pneumothorax, similar in size to prior. No abnormal shift of the heart or mediastinum. Left chest port remains in place. Right lung is clear. No right-sided pneumothorax. Normal heart size. IMPRESSION: Large left pneumothorax, similar in size to prior. Left-sided chest tube in place. Electronically Signed   By: Davina Poke D.O.   On: 02/01/2022 09:25   DG CHEST PORT 1 VIEW  Result Date: 01/31/2022 CLINICAL DATA:  Follow pneumothorax EXAM: PORTABLE CHEST 1 VIEW COMPARISON:  Chest radiograph 02/01/2019 FINDINGS: Marked interval increase in size of the left-sided pneumothorax, now large. Persistent left  lung atelectasis. Pleural drainage catheter in place. Unchanged positioning of the port. Right chest wall surgical clips. No displaced rib fractures. IMPRESSION: Marked interval increase in size of the left-sided pneumothorax, now large. These results will be called to the ordering clinician or representative by the Radiologist Assistant, and communication documented in the PACS or [employer]. Electronically Signed   By: Marin Roberts M.D.   On: 01/31/2022 14:32   DG CHEST PORT 1 VIEW  Result Date: 01/31/2022 CLINICAL DATA:  Chest tube placement, pneumothorax EXAM: PORTABLE CHEST 1 VIEW COMPARISON:  Chest radiograph 01/30/2022 FINDINGS: LEFT apical pneumothorax is decreased in volume with the pleural edge measuring 20 mm from the apical chest wall compared to 30 mm. Small-caliber chest tube in the LEFT midlung unchanged. Port in the anterior chest wall with tip in distal SVC. LEFT basilar atelectasis unchanged. IMPRESSION: 1. Reduction in volume of LEFT pneumothorax. 2. Chest tube in place. 3. LEFT basilar atelectasis. Electronically Signed   By: Suzy Bouchard M.D.   On: 01/31/2022 10:06   DG CHEST PORT 1 VIEW  Result Date: 01/30/2022 CLINICAL DATA:  Complication of chest tube. EXAM: PORTABLE CHEST 1 VIEW COMPARISON:  Radiograph earlier today. FINDINGS: Pigtail catheter in the right hemithorax projects over the mid lung. Left pneumothorax is slightly increased in size from prior exam, measuring between the posterior fourth and fifth ribs. Stable left lung base atelectasis. Left chest port remains in place. The right lung is clear. Stable heart size and mediastinal contours. IMPRESSION: 1. Left pigtail catheter remains in place. Left pneumothorax is minimally increased in size from prior exam. 2. Stable left  lung base atelectasis. Electronically Signed   By: Keith Rake M.D.   On: 01/30/2022 20:15    Anti-infectives: Anti-infectives (From admission, onward)    Start     Dose/Rate Route  Frequency Ordered Stop   01/28/22 0800  ceFAZolin (ANCEF) IVPB 2g/100 mL premix        2 g 200 mL/hr over 30 Minutes Intravenous On call to O.R. 01/28/22 0757 01/28/22 1020       Assessment/Plan: s/p Procedure(s): RIGHT SENTINEL LYMPH NODE BIOPSY (Right) INSERTION PORT-A-CATH (Left) Continue chest tube  Chest x-ray shows no significant improvement of pneumothorax on 40 cm of suction.  She is asymptomatic for now.  We will reassess tube function and may need CVTS to weigh in on this.  No evidence of any hemodynamic instability or tension pneumothorax.  Continue pulmonary toilet for now.  Recheck chest x-ray in a.m.  LOS: 3 days    Turner Daniels MD 02/01/2022

## 2022-02-02 ENCOUNTER — Inpatient Hospital Stay (HOSPITAL_COMMUNITY): Payer: Managed Care, Other (non HMO)

## 2022-02-02 NOTE — Progress Notes (Signed)
5 Days Post-Op   Subjective/Chief Complaint: Suction increased yesterday on the wall.  She was already at 40 cm of suction the wall suction was increased to help improve pulled off the Pleur-evac.  Once that was done, she developed significant chest pain.  Chest x-ray was performed which showed complete reinflation of the lung with a possible less than 10% residual pneumothorax in the apex.  Today, she has no chest pain, shortness of breath and feels well.   Objective: Vital signs in last 24 hours: Temp:  [97.8 F (36.6 C)-98.2 F (36.8 C)] 97.8 F (36.6 C) (10/22 0759) Pulse Rate:  [80-85] 85 (10/22 0759) Resp:  [16-18] 18 (10/22 0759) BP: (116-134)/(70-85) 134/85 (10/22 0759) SpO2:  [96 %-97 %] 97 % (10/22 0759) Last BM Date : 01/27/22  Intake/Output from previous day: 10/21 0701 - 10/22 0700 In: 880 [P.O.:880] Out: 0  Intake/Output this shift: No intake/output data recorded.  Port site left chest clean dry intact  Lung sounds symmetrical bilateral  Chest tube in place no obvious large air leak noted  Lab Results:  No results for input(s): "WBC", "HGB", "HCT", "PLT" in the last 72 hours. BMET No results for input(s): "NA", "K", "CL", "CO2", "GLUCOSE", "BUN", "CREATININE", "CALCIUM" in the last 72 hours. PT/INR No results for input(s): "LABPROT", "INR" in the last 72 hours. ABG No results for input(s): "PHART", "HCO3" in the last 72 hours.  Invalid input(s): "PCO2", "PO2"  Studies/Results: DG Chest Port 1 View  Result Date: 02/02/2022 CLINICAL DATA:  Follow-up pneumothorax. EXAM: PORTABLE CHEST 1 VIEW COMPARISON:  02/01/2022 and prior studies FINDINGS: A LEFT thoracostomy tube and LEFT subclavian Port-A-Cath with tip overlying the mid SVC again noted. A tiny (less than 5%) LEFT apical pneumothorax is not significantly changed. Subsegmental atelectasis/scarring in the LEFT LOWER lung again identified. There has been little interval change since the prior study.  IMPRESSION: Unchanged appearance of the chest with tiny (less than 5%) LEFT apical pneumothorax. Electronically Signed   By: Margarette Canada M.D.   On: 02/02/2022 07:54   DG CHEST PORT 1 VIEW  Result Date: 02/01/2022 CLINICAL DATA:  Pneumothorax. EXAM: PORTABLE CHEST 1 VIEW COMPARISON:  Chest x-ray from earlier same day. FINDINGS: LEFT-sided chest tube in place. Significant reduction in the size of the LEFT-sided pneumothorax. Perhaps a tiny residual pneumothorax at the LEFT lung apex, but not convincing. Probable mild scarring/atelectasis at the LEFT lung base. RIGHT lung is clear. Heart size and mediastinal contours are within normal limits and are within the midline. LEFT chest wall Port-A-Cath in place with tip at the level of the mid/lower SVC. Osseous structures about the chest are unremarkable. IMPRESSION: Significant reduction in the size of the LEFT-sided pneumothorax since the earlier chest x-ray from earlier today. Perhaps a tiny residual pneumothorax at the LEFT lung apex, but not convincing. Electronically Signed   By: Franki Cabot M.D.   On: 02/01/2022 12:19   DG Chest Port 1 View  Result Date: 02/01/2022 CLINICAL DATA:  Pneumothorax EXAM: PORTABLE CHEST 1 VIEW COMPARISON:  01/31/2022 FINDINGS: Left-sided chest tube positioned within the upper to mid left chest. Large left pneumothorax, similar in size to prior. No abnormal shift of the heart or mediastinum. Left chest port remains in place. Right lung is clear. No right-sided pneumothorax. Normal heart size. IMPRESSION: Large left pneumothorax, similar in size to prior. Left-sided chest tube in place. Electronically Signed   By: Davina Poke D.O.   On: 02/01/2022 09:25   DG CHEST PORT  1 VIEW  Result Date: 01/31/2022 CLINICAL DATA:  Follow pneumothorax EXAM: PORTABLE CHEST 1 VIEW COMPARISON:  Chest radiograph 02/01/2019 FINDINGS: Marked interval increase in size of the left-sided pneumothorax, now large. Persistent left lung  atelectasis. Pleural drainage catheter in place. Unchanged positioning of the port. Right chest wall surgical clips. No displaced rib fractures. IMPRESSION: Marked interval increase in size of the left-sided pneumothorax, now large. These results will be called to the ordering clinician or representative by the Radiologist Assistant, and communication documented in the PACS or [employer]. Electronically Signed   By: Marin Roberts M.D.   On: 01/31/2022 14:32   DG CHEST PORT 1 VIEW  Result Date: 01/31/2022 CLINICAL DATA:  Chest tube placement, pneumothorax EXAM: PORTABLE CHEST 1 VIEW COMPARISON:  Chest radiograph 01/30/2022 FINDINGS: LEFT apical pneumothorax is decreased in volume with the pleural edge measuring 20 mm from the apical chest wall compared to 30 mm. Small-caliber chest tube in the LEFT midlung unchanged. Port in the anterior chest wall with tip in distal SVC. LEFT basilar atelectasis unchanged. IMPRESSION: 1. Reduction in volume of LEFT pneumothorax. 2. Chest tube in place. 3. LEFT basilar atelectasis. Electronically Signed   By: Suzy Bouchard M.D.   On: 01/31/2022 10:06    Anti-infectives: Anti-infectives (From admission, onward)    Start     Dose/Rate Route Frequency Ordered Stop   01/28/22 0800  ceFAZolin (ANCEF) IVPB 2g/100 mL premix        2 g 200 mL/hr over 30 Minutes Intravenous On call to O.R. 01/28/22 0757 01/28/22 1020       Assessment/Plan: s/p Procedure(s): RIGHT SENTINEL LYMPH NODE BIOPSY (Right) INSERTION PORT-A-CATH (Left) Pneumothorax left with chest tube  Decreased suction down to 20 cm today.  Check film in a.m.  More likely could be waterseal in the morning and discharged in the afternoon if stable.  Dr. Barry Dienes to follow-up tomorrow.  Plan explained to patient and husband at bedside today.  LOS: 4 days    Turner Daniels MD 02/02/2022

## 2022-02-03 ENCOUNTER — Inpatient Hospital Stay (HOSPITAL_COMMUNITY): Payer: Managed Care, Other (non HMO)

## 2022-02-03 ENCOUNTER — Encounter: Payer: Self-pay | Admitting: *Deleted

## 2022-02-03 MED ORDER — FAMOTIDINE 20 MG PO TABS: 20.0000 mg | ORAL_TABLET | Freq: Every day | ORAL | Status: AC

## 2022-02-03 NOTE — Progress Notes (Signed)
[name] to be D/C'd  per MD order.  Discussed with the patient and all questions fully answered.  VSS, Skin clean, dry and intact without evidence of skin break down, no evidence of skin tears noted.  IV catheter discontinued intact. Site without signs and symptoms of complications. Dressing and pressure applied.  An After Visit Summary was printed and given to the patient.  D/c education completed with patient/family including follow up instructions, medication list, d/c activities limitations if indicated, with other d/c instructions as indicated by MD - patient able to verbalize understanding, all questions fully answered.   Patient instructed to return to ED, call 911, or call MD for any changes in condition.   Patient to be escorted via [place], and D/C home via private auto.

## 2022-02-03 NOTE — Progress Notes (Signed)
Left pigtail chest tube removed without difficulty. Patient instructed to use IS and call primary RN if having SOB. Patient placed in semi-fowler position in be. Informed of x-ray to be done. No c/o pain or SOB. Bed in lowest position. IS, Call bell and personal items within reach.

## 2022-02-03 NOTE — Progress Notes (Signed)
Mobility Specialist - Progress Note   02/03/22 1231  Mobility  Activity Ambulated independently in hallway  Level of Assistance Independent  Assistive Device None  Distance Ambulated (ft) 550 ft  Activity Response Tolerated well  $Mobility charge 1 Mobility    Pt received in recliner agreeable to mobility. Left in recliner w/ call bell and all needs met.   Paulla Dolly Mobility Specialist

## 2022-02-03 NOTE — Discharge Summary (Signed)
Physician Discharge Summary  Patient ID: Samantha [name] MRN: [id number] DOB/AGE: 1960-10-02 61 y.o.  Admit date: 01/28/2022 Discharge date: 02/03/2022  Admission Diagnoses: Patient Active Problem List   Diagnosis Date Noted   Breast cancer of upper-outer quadrant of right female breast ([place]) 01/28/2022   Breast cancer ([place]) 01/28/2022   Malignant neoplasm of upper-outer quadrant of right breast in female, estrogen receptor negative ([place]) 12/23/2021   Radiculopathy of lumbar region 09/26/2020   Strain of right tibialis anterior muscle 09/26/2020   Carpal tunnel syndrome 09/19/2019   Thoracic disc disorder 02/04/2018   Inflammatory heel pain 02/22/2016   Osteoarthritis, hand 02/15/2015   Elbow pain, chronic, right 01/03/2015   Cervical radiculopathy at C8 03/21/2014   Degenerative disc disease, cervical 08/18/2013   Degenerative arthritis of lumbar spine 08/18/2013   Achilles tendinitis 01/06/2012   HYPOTHYROIDISM 03/15/2009   Plantar fasciitis, right 03/15/2009   CAVUS DEFORMITY OF FOOT, ACQUIRED 03/15/2009    Discharge Diagnoses:  Principal Problem:   Breast cancer of upper-outer quadrant of right female breast [facility]) Active Problems:   Breast cancer [facility])   Discharged Condition: stable  Hospital Course:  Pt underwent port placement and right sentinel node biopsy 10/17.2023.  Unfortunately, she had a moderate size PTX on her post op CXR.  She was asymptomatic at the time.  Repeat CXR 2 hours later showed larger PTX.  She was transferred to [facility] and had chest tube placed on the left with complete reinflation of the lung.  The chest tube was placed to water seal on POD 1.  Her lung dropped with repeat CXR.  It was placed back to suction.  The next morning, the PTX was improved, but not resolved.  Suction was increased.  The CXR the next day was improved and tube was placed to water seal.  Unfortutely, the lung dropped again.  She was placed back to suction and this was left for 2.5  more days.  Lung was nearly completely inflated.  This time water seal was successful and chest tube was removed. Follow up CXR post pull was also good and she was discharged on POD 6.     Consults: None  Significant Diagnostic Studies: labs: none, see above for films  Treatments: surgery- see above, tube thoracostomy  Discharge Exam: Blood pressure 121/68, pulse (!) 101, temperature 98 F (36.7 C), temperature source Oral, resp. rate 16, height 5' 7.5" (1.715 m), weight 76.7 kg, SpO2 94 %. General appearance: alert, cooperative, and no distress Resp: breathing comfortably Right axilla without hematoma.  Port in place and intact.  Minimal CT output in pleurovac  Disposition: Discharge disposition: 01-Home or Self Care       Discharge Instructions     Call MD for:  difficulty breathing, headache or visual disturbances   Complete by: As directed    Call MD for:  difficulty breathing, headache or visual disturbances   Complete by: As directed    Call MD for:  hives   Complete by: As directed    Call MD for:  hives   Complete by: As directed    Call MD for:  persistant nausea and vomiting   Complete by: As directed    Call MD for:  persistant nausea and vomiting   Complete by: As directed    Call MD for:  redness, tenderness, or signs of infection (pain, swelling, redness, odor or green/yellow discharge around incision site)   Complete by: As directed    Call MD for:  redness, tenderness, or signs of infection (pain, swelling, redness, odor or green/yellow discharge around incision site)   Complete by: As directed    Call MD for:  severe uncontrolled pain   Complete by: As directed    Call MD for:  severe uncontrolled pain   Complete by: As directed    Call MD for:  temperature >100.4   Complete by: As directed    Call MD for:  temperature >100.4   Complete by: As directed    Diet - low sodium heart healthy   Complete by: As directed    Diet - low sodium heart healthy    Complete by: As directed    Increase activity slowly   Complete by: As directed    Increase activity slowly   Complete by: As directed       Allergies as of 02/03/2022       Reactions   Nortriptyline Other (See Comments)   depression   Singulair [montelukast Sodium] Other (See Comments)   Depressed mood   Sulfonamide Derivatives Nausea And Vomiting   Amitriptyline Hcl    Depression mood   Esomeprazole Hives   Gabapentin    Hair Loss   Lexapro [escitalopram]    Depression mood   Neosporin [bacitracin-polymyxin B] Rash        Medication List     TAKE these medications    acetaminophen 500 MG tablet Commonly known as: TYLENOL Take 1,000 mg by mouth every 6 (six) hours as needed for moderate pain.   Ativan 0.5 MG tablet Generic drug: LORazepam Take 0.25-0.5 mg by mouth 2 (two) times daily as needed for anxiety.   Calcium-Vitamin D3 250-125 MG-UNIT Tabs Take 1 tablet once a day   dexamethasone 4 MG tablet Commonly known as: DECADRON Take 2 tabs by mouth 2 times daily starting day before chemo. Then take 2 tabs daily for 2 days starting day after chemo. Take with food.   EPINEPHrine 0.3 mg/0.3 mL Soaj injection Commonly known as: EPI-PEN Inject 0.3 mg into the muscle as needed for anaphylaxis.   famotidine 20 MG tablet Commonly known as: PEPCID Take 1 tablet (20 mg total) by mouth daily. Start taking on: February 04, 2022   lidocaine-prilocaine cream Commonly known as: EMLA Apply to affected area once   ondansetron 8 MG tablet Commonly known as: Zofran Take 1 tablet (8 mg total) by mouth every 8 (eight) hours as needed for nausea or vomiting. Start on the third day after chemotherapy.   oxyCODONE 5 MG immediate release tablet Commonly known as: Oxy IR/ROXICODONE Take 1 tablet (5 mg total) by mouth every 6 (six) hours as needed for severe pain.   prochlorperazine 10 MG tablet Commonly known as: COMPAZINE Take 1 tablet (10 mg total) by mouth every 6  (six) hours as needed for nausea or vomiting.   sertraline 50 MG tablet Commonly known as: ZOLOFT Take 50 mg by mouth daily.   Tirosint 88 MCG Caps Generic drug: Levothyroxine Sodium Take 88 mcg by mouth See admin instructions. Take 88 mcg by mouth daily Monday-Saturday   Tirosint 100 MCG Caps Generic drug: Levothyroxine Sodium Take 100 mcg by mouth every Sunday.   Vitamin D3 25 MCG (1000 UT) Caps Take 1,000 Units by mouth daily.        Follow-up Information     Stark Klein, MD Follow up in 2 week(s).   Specialty: General Surgery Contact information: [place] [address] [phone]  Truitt Merle, MD Follow up on 02/14/2022.   Specialties: Hematology, Oncology Contact information: [place] [address] [phone]                 Signed: Stark Klein 02/03/2022, 6:12 PM

## 2022-02-03 NOTE — Progress Notes (Signed)
6 Days Post-Op   Subjective/Chief Complaint: CXR this AM looked stable with <5% PTX.  Pt asymptomatic.  Ct placed to water seal this AM.    Objective: Vital signs in last 24 hours: Temp:  [98 F (36.7 C)-98.4 F (36.9 C)] 98.4 F (36.9 C) (10/23 0827) Pulse Rate:  [84-95] 84 (10/23 0827) Resp:  [16-18] 16 (10/23 0827) BP: (116-139)/(65-116) 139/116 (10/23 0827) SpO2:  [96 %-97 %] 97 % (10/23 0827) Last BM Date : 01/27/22  Intake/Output from previous day: 10/22 0701 - 10/23 0700 In: 240 [P.O.:240] Out: 10 [Chest Tube:10] Intake/Output this shift: Total I/O In: 240 [P.O.:240] Out: -   Port site left chest clean dry intact Chest tube in place no obvious large air leak noted  Lab Results:  No results for input(s): "WBC", "HGB", "HCT", "PLT" in the last 72 hours. BMET No results for input(s): "NA", "K", "CL", "CO2", "GLUCOSE", "BUN", "CREATININE", "CALCIUM" in the last 72 hours. PT/INR No results for input(s): "LABPROT", "INR" in the last 72 hours. ABG No results for input(s): "PHART", "HCO3" in the last 72 hours.  Invalid input(s): "PCO2", "PO2"  Studies/Results: DG Chest Port 1 View  Result Date: 02/03/2022 CLINICAL DATA:  [id number].  Left pneumothorax and chest tube. EXAM: PORTABLE CHEST 1 VIEW COMPARISON:  Portable chest yesterday at 7:27 a.m. FINDINGS: 5:38 a.m. Minimal, 3% or less volume left apical pneumothorax is unchanged in appearance as well as a left apicolateral pigtail chest tube positioning. Left chest port and subclavian approach catheter are unchanged with catheter tip in distal SVC. Heart size and vascular pattern are normal. Unremarkable stable mediastinum. Linear scarring or atelectasis in the left base appears similar. No focal pneumonic infiltrates or other focal opacities. Surgical clips again noted right axilla, right breast. IMPRESSION: Minimal left apical pneumothorax, stable . Electronically Signed   By: Telford Nab M.D.   On: 02/03/2022 06:46   DG  Chest Port 1 View  Result Date: 02/02/2022 CLINICAL DATA:  Follow-up pneumothorax. EXAM: PORTABLE CHEST 1 VIEW COMPARISON:  02/01/2022 and prior studies FINDINGS: A LEFT thoracostomy tube and LEFT subclavian Port-A-Cath with tip overlying the mid SVC again noted. A tiny (less than 5%) LEFT apical pneumothorax is not significantly changed. Subsegmental atelectasis/scarring in the LEFT LOWER lung again identified. There has been little interval change since the prior study. IMPRESSION: Unchanged appearance of the chest with tiny (less than 5%) LEFT apical pneumothorax. Electronically Signed   By: Margarette Canada M.D.   On: 02/02/2022 07:54    Anti-infectives: Anti-infectives (From admission, onward)    Start     Dose/Rate Route Frequency Ordered Stop   01/28/22 0800  ceFAZolin (ANCEF) IVPB 2g/100 mL premix        2 g 200 mL/hr over 30 Minutes Intravenous On call to O.R. 01/28/22 0757 01/28/22 1020       Assessment/Plan: s/p Procedure(s): RIGHT SENTINEL LYMPH NODE BIOPSY (Right) INSERTION PORT-A-CATH (Left) Pneumothorax left with chest tube  Water seal on chest tube.  If 1:30 pm CXR is ok, will plan to d/c tube and get a post pull CXR.   Hopefully home this afternoon.     LOS: 5 days    Stark Klein MD 02/03/2022

## 2022-02-06 ENCOUNTER — Ambulatory Visit: Payer: Managed Care, Other (non HMO)

## 2022-02-06 ENCOUNTER — Encounter: Payer: Self-pay | Admitting: Hematology

## 2022-02-06 ENCOUNTER — Ambulatory Visit: Payer: Managed Care, Other (non HMO) | Admitting: Radiation Oncology

## 2022-02-07 ENCOUNTER — Ambulatory Visit: Payer: Managed Care, Other (non HMO) | Admitting: Radiation Oncology

## 2022-02-10 ENCOUNTER — Other Ambulatory Visit: Payer: Self-pay

## 2022-02-14 ENCOUNTER — Inpatient Hospital Stay: Payer: Managed Care, Other (non HMO) | Admitting: Hematology

## 2022-02-14 ENCOUNTER — Inpatient Hospital Stay: Payer: Managed Care, Other (non HMO) | Attending: Hematology

## 2022-02-14 ENCOUNTER — Encounter: Payer: Self-pay | Admitting: Hematology

## 2022-02-14 VITALS — BP 135/77 | HR 85 | Temp 98.3°F | Resp 14 | Ht 67.5 in | Wt 169.1 lb

## 2022-02-14 DIAGNOSIS — M898X9 Other specified disorders of bone, unspecified site: Secondary | ICD-10-CM | POA: Diagnosis not present

## 2022-02-14 DIAGNOSIS — Z171 Estrogen receptor negative status [ER-]: Secondary | ICD-10-CM

## 2022-02-14 DIAGNOSIS — Z5111 Encounter for antineoplastic chemotherapy: Secondary | ICD-10-CM | POA: Diagnosis present

## 2022-02-14 DIAGNOSIS — C50411 Malignant neoplasm of upper-outer quadrant of right female breast: Secondary | ICD-10-CM | POA: Insufficient documentation

## 2022-02-14 DIAGNOSIS — R509 Fever, unspecified: Secondary | ICD-10-CM | POA: Insufficient documentation

## 2022-02-14 DIAGNOSIS — Z888 Allergy status to other drugs, medicaments and biological substances status: Secondary | ICD-10-CM | POA: Diagnosis not present

## 2022-02-14 DIAGNOSIS — N6331 Unspecified lump in axillary tail of the right breast: Secondary | ICD-10-CM | POA: Diagnosis not present

## 2022-02-14 DIAGNOSIS — Z5189 Encounter for other specified aftercare: Secondary | ICD-10-CM | POA: Diagnosis not present

## 2022-02-14 DIAGNOSIS — Z882 Allergy status to sulfonamides status: Secondary | ICD-10-CM | POA: Diagnosis not present

## 2022-02-14 DIAGNOSIS — H9311 Tinnitus, right ear: Secondary | ICD-10-CM | POA: Diagnosis not present

## 2022-02-14 DIAGNOSIS — R519 Headache, unspecified: Secondary | ICD-10-CM | POA: Diagnosis not present

## 2022-02-14 DIAGNOSIS — Z79899 Other long term (current) drug therapy: Secondary | ICD-10-CM | POA: Diagnosis not present

## 2022-02-14 NOTE — Progress Notes (Signed)
[place]   Telephone:([phone]) [phone] Fax:([phone]) [phone]   Clinic Follow up Note   Patient Care Team: Crist Infante, MD as PCP - General (Internal Medicine) Mauro Kaufmann, RN as Oncology Nurse Navigator [name], RN as Oncology Nurse Navigator Stark Klein, MD as Consulting Physician (General Surgery) Truitt Merle, MD as Consulting Physician (Hematology) Kyung Rudd, MD as Consulting Physician (Radiation Oncology) Marcene Corning, MD as Consulting Physician (Endocrinology) Stefanie Libel, MD as Consulting Physician ([place]) Marylynn Pearson, MD as Consulting Physician (Obstetrics and Gynecology)  Date of Service:  02/14/2022  CHIEF COMPLAINT: f/u of right breast cancer  CURRENT THERAPY:  To start adjuvant TC  ASSESSMENT & PLAN:  Samantha [name] is a 61 y.o. post-menopausal female with   1. Malignant neoplasm of upper-outer quadrant of right breast, IDC and DCIS, Stage IA, pT1b, cN0, triple negative, Grade 3 -found on screening mammogram. S/p lumpectomy on 01/08/22 with Dr. Barry Dienes, path showed: 8 mm invasive and in situ ductal carcinoma with negative margins. Repeat prognostic panel confirmed triple negative disease.  -lymph node biopsies 01/28/22 showed negative nodes (0/2). Port placed at time of procedure. Postoperative course complicated by pneumothorax, which has resolved. -I reviewed the results with them and explained that my recommendation for adjuvant 4 cycles of TC remains the same. We reviewed potential AE's again today. Will plan to start next week. Chemo consent obtain. I called in antiemetics, Emla cream, and dexamethasone.  We reviewed how to take those medications today.   PLAN: -chemo class today -lab, flush, and TC next week  -she prefers Wednesdays or Thursdays -f/u a week after first TC   No problem-specific Assessment & Plan notes found for this encounter.   SUMMARY OF ONCOLOGIC HISTORY: Oncology History  Malignant neoplasm of  upper-outer quadrant of right breast in female, estrogen receptor negative ([place])  12/06/2021 Mammogram   CLINICAL DATA:  Patient returns today to evaluate RIGHT breast calcifications identified on a recent screening mammogram.   EXAM: DIGITAL DIAGNOSTIC UNILATERAL RIGHT MAMMOGRAM  IMPRESSION: Grouped coarse heterogeneous calcifications within the upper-outer quadrant of the RIGHT breast, measuring 6 mm extent. These may be fibroadenomatous calcifications. Stereotactic biopsy is recommended to exclude malignancy.   12/18/2021 Initial Biopsy   Diagnosis Breast, right, needle core biopsy, upper outer quadrant, x clip - DUCTAL CARCINOMA IN SITU, SOLID AND CRIBRIFORM TYPE WITH COMEDONECROSIS AND ASSOCIATED CALCIFICATIONS, NUCLEAR GRADE 3 OF 3 - FOCAL MICROINVASION IS PRESENT (LESS THAN 1 MM) WITH EVIDENCE OF LYMPHOVASCULAR INVASION - NECROSIS: PRESENT - CALCIFICATIONS: PRESENT - DCIS LENGTH: 7 MM IN GREATEST LINEAR DIMENSION ON FRAGMENTED CORES  PROGNOSTIC INDICATORS Results: IMMUNOHISTOCHEMICAL AND MORPHOMETRIC ANALYSIS PERFORMED MANUALLY The tumor cells are NEGATIVE for Her2 (1+). Estrogen Receptor: 0%, NEGATIVE Progesterone Receptor: 0%, NEGATIVE Proliferation Marker Ki67: 60%   12/23/2021 Initial Diagnosis   Malignant neoplasm of upper-outer quadrant of right breast in female, estrogen receptor negative ([place])   12/23/2021 Imaging   EXAM: ULTRASOUND OF THE RIGHT AXILLA  IMPRESSION: No abnormal appearing RIGHT axillary lymph nodes.   01/08/2022 Cancer Staging   Staging form: Breast, AJCC 8th Edition - Pathologic stage from 01/08/2022: Stage IB (pT1b, pN0, cM0, G3, ER-, PR-, HER2-) - Signed by Truitt Merle, MD on 01/24/2022 Stage prefix: Initial diagnosis Histologic grading system: 3 grade system Residual tumor (R): R0 - None   02/20/2022 -  Chemotherapy   Patient is on Treatment Plan : BREAST TC q21d        INTERVAL HISTORY:  Samantha Pospisil  [name] is here for a follow up of breast  cancer. She was last seen by me on 01/24/22. She presents to the clinic accompanied by her husband. She reports she has recovered well from her recent procedure and subsequent pneumothorax.   All other systems were reviewed with the patient and are negative.  MEDICAL HISTORY:  Past Medical History:  Diagnosis Date   Allergy    Anxiety 2015   Result of culmination of super stressful caregiving and deaths of 2 immediate family members.  Overall do pretty well.   Cancer [facility]) 2023   right breast   Depression    GERD (gastroesophageal reflux disease)    Hypothyroidism    Right carpal tunnel syndrome 09/19/2019   Thyroid disease     SURGICAL HISTORY: Past Surgical History:  Procedure Laterality Date   BREAST LUMPECTOMY WITH RADIOACTIVE SEED LOCALIZATION Right 01/08/2022   Procedure: RIGHT BREAST LUMPECTOMY WITH RADIOACTIVE SEED LOCALIZATION;  Surgeon: Stark Klein, MD;  Location: [facility];  Service: General;  Laterality: Right;   PORTACATH PLACEMENT Left 01/28/2022   Procedure: INSERTION PORT-A-CATH;  Surgeon: Stark Klein, MD;  Location: [facility];  Service: General;  Laterality: Left;   SENTINEL NODE BIOPSY Right 01/28/2022   Procedure: RIGHT SENTINEL LYMPH NODE BIOPSY;  Surgeon: Stark Klein, MD;  Location: [facility];  Service: General;  Laterality: Right;   TONSILLECTOMY      I have reviewed the social history and family history with the patient and they are unchanged from previous note.  ALLERGIES:  is allergic to nortriptyline, singulair [montelukast sodium], sulfonamide derivatives, amitriptyline hcl, esomeprazole, gabapentin, lexapro [escitalopram], and neosporin [bacitracin-polymyxin b].  MEDICATIONS:  Current Outpatient Medications  Medication Sig Dispense Refill   acetaminophen (TYLENOL) 500 MG tablet Take 1,000 mg by mouth every 6 (six) hours as needed for moderate pain.     Calcium Carb-Cholecalciferol (CALCIUM-VITAMIN D3) 250-125 MG-UNIT  TABS Take 1 tablet once a day     Cholecalciferol (VITAMIN D3) 1000 units CAPS Take 1,000 Units by mouth daily.     dexamethasone (DECADRON) 4 MG tablet Take 2 tabs by mouth 2 times daily starting day before chemo. Then take 2 tabs daily for 2 days starting day after chemo. Take with food. (Patient not taking: Reported on 01/28/2022) 30 tablet 1   EPINEPHrine 0.3 mg/0.3 mL IJ SOAJ injection Inject 0.3 mg into the muscle as needed for anaphylaxis. 2 each 1   famotidine (PEPCID) 20 MG tablet Take 1 tablet (20 mg total) by mouth daily.     lidocaine-prilocaine (EMLA) cream Apply to affected area once (Patient not taking: Reported on 01/28/2022) 30 g 3   LORazepam (ATIVAN) 0.5 MG tablet Take 0.25-0.5 mg by mouth 2 (two) times daily as needed for anxiety.     ondansetron (ZOFRAN) 8 MG tablet Take 1 tablet (8 mg total) by mouth every 8 (eight) hours as needed for nausea or vomiting. Start on the third day after chemotherapy. (Patient not taking: Reported on 01/28/2022) 30 tablet 1   oxyCODONE (OXY IR/ROXICODONE) 5 MG immediate release tablet Take 1 tablet (5 mg total) by mouth every 6 (six) hours as needed for severe pain. (Patient not taking: Reported on 01/28/2022) 5 tablet 0   prochlorperazine (COMPAZINE) 10 MG tablet Take 1 tablet (10 mg total) by mouth every 6 (six) hours as needed for nausea or vomiting. (Patient not taking: Reported on 01/28/2022) 30 tablet 1   sertraline (ZOLOFT) 50 MG tablet Take 50 mg by mouth  daily.     TIROSINT 100 MCG CAPS Take 100 mcg by mouth every Sunday.     TIROSINT 88 MCG CAPS Take 88 mcg by mouth See admin instructions. Take 88 mcg by mouth daily Monday-Saturday  10   No current facility-administered medications for this visit.    PHYSICAL EXAMINATION: ECOG PERFORMANCE STATUS: 0 - Asymptomatic  Vitals:   02/14/22 1219  BP: 135/77  Pulse: 85  Resp: 14  Temp: 98.3 F (36.8 C)  SpO2: 98%   Wt Readings from Last 3 Encounters:  02/14/22 169 lb 1.6 oz (76.7 kg)   01/28/22 169 lb 1.5 oz (76.7 kg)  01/24/22 169 lb 3.2 oz (76.7 kg)     GENERAL:alert, no distress and comfortable SKIN: skin color normal, no rashes or significant lesions EYES: normal, Conjunctiva are pink and non-injected, sclera clear  NEURO: alert & oriented x 3 with fluent speech  LABORATORY DATA:  I have reviewed the data as listed    Latest Ref Rng & Units 01/28/2022    6:19 PM 12/25/2021   12:28 PM 03/09/2013   11:27 PM  CBC  WBC 4.0 - 10.5 K/uL 10.7  11.4  10.7   Hemoglobin 12.0 - 15.0 g/dL 14.1  15.0  14.4   Hematocrit 36.0 - 46.0 % 42.1  45.8  42.7   Platelets 150 - 400 K/uL 282  306  310         Latest Ref Rng & Units 01/28/2022    6:19 PM 12/25/2021   12:28 PM 03/09/2013   11:27 PM  CMP  Glucose 70 - 99 mg/dL  156  113   BUN 8 - 23 mg/dL  13  11   Creatinine 0.44 - 1.00 mg/dL 0.70  0.66  0.62   Sodium 135 - 145 mmol/L  137  140   Potassium 3.5 - 5.1 mmol/L  3.6  3.5   Chloride 98 - 111 mmol/L  104  102   CO2 22 - 32 mmol/L  27  26   Calcium 8.9 - 10.3 mg/dL  10.1  9.6   Total Protein 6.5 - 8.1 g/dL  8.1    Total Bilirubin 0.3 - 1.2 mg/dL  0.5    Alkaline Phos 38 - 126 U/L  104    AST 15 - 41 U/L  28    ALT 0 - 44 U/L  44        RADIOGRAPHIC STUDIES: I have personally reviewed the radiological images as listed and agreed with the findings in the report. No results found.    Orders Placed This Encounter  Procedures   CBC with Differential ([facility] Only)    Standing Status:   Future    Standing Expiration Date:   02/21/2023   CMP ([facility] [place] only)    Standing Status:   Future    Standing Expiration Date:   02/21/2023   CBC with Differential ([place] Only)    Standing Status:   Future    Standing Expiration Date:   03/14/2023   CMP ([place] only)    Standing Status:   Future    Standing Expiration Date:   03/14/2023   CBC with Differential ([place] Only)    Standing Status:   Future    Standing Expiration Date:    04/04/2023   CMP ([place] only)    Standing Status:   Future    Standing Expiration Date:   04/04/2023   CBC with Differential ([place]  Only)    Standing Status:   Future    Standing Expiration Date:   04/25/2023   CMP ([place] only)    Standing Status:   Future    Standing Expiration Date:   04/25/2023   All questions were answered. The patient knows to call the clinic with any problems, questions or concerns. No barriers to learning was detected. The total time spent in the appointment was 30 minutes.     Truitt Merle, MD 02/14/2022   I, Wilburn Mylar, am acting as scribe for Truitt Merle, MD.   I have reviewed the above documentation for accuracy and completeness, and I agree with the above.

## 2022-02-15 ENCOUNTER — Other Ambulatory Visit: Payer: Self-pay

## 2022-02-17 ENCOUNTER — Encounter: Payer: Self-pay | Admitting: *Deleted

## 2022-02-18 ENCOUNTER — Other Ambulatory Visit: Payer: Self-pay

## 2022-02-19 ENCOUNTER — Encounter: Payer: Self-pay | Admitting: Hematology

## 2022-02-19 MED FILL — Dexamethasone Sodium Phosphate Inj 100 MG/10ML: INTRAMUSCULAR | Qty: 1 | Status: AC

## 2022-02-19 NOTE — Progress Notes (Signed)
Called pt to introduce myself as her Arboriculturist and to discuss the [employer].  I went over what it covers and gave her the income requirement.  She stated she exceeds that limit so she doesn't qualify for the grant at this time.

## 2022-02-20 ENCOUNTER — Inpatient Hospital Stay: Payer: Managed Care, Other (non HMO)

## 2022-02-20 ENCOUNTER — Encounter: Payer: Self-pay | Admitting: *Deleted

## 2022-02-20 VITALS — BP 143/79 | HR 72 | Temp 98.1°F | Resp 17 | Wt 169.2 lb

## 2022-02-20 DIAGNOSIS — Z171 Estrogen receptor negative status [ER-]: Secondary | ICD-10-CM

## 2022-02-20 DIAGNOSIS — Z5111 Encounter for antineoplastic chemotherapy: Secondary | ICD-10-CM | POA: Diagnosis not present

## 2022-02-20 LAB — CBC WITH DIFFERENTIAL (CANCER CENTER ONLY)
Abs Immature Granulocytes: 0.04 10*3/uL (ref 0.00–0.07)
Basophils Absolute: 0 10*3/uL (ref 0.0–0.1)
Basophils Relative: 0 %
Eosinophils Absolute: 0 10*3/uL (ref 0.0–0.5)
Eosinophils Relative: 0 %
HCT: 41.2 % (ref 36.0–46.0)
Hemoglobin: 13.6 g/dL (ref 12.0–15.0)
Immature Granulocytes: 0 %
Lymphocytes Relative: 9 %
Lymphs Abs: 1.4 10*3/uL (ref 0.7–4.0)
MCH: 30.6 pg (ref 26.0–34.0)
MCHC: 33 g/dL (ref 30.0–36.0)
MCV: 92.6 fL (ref 80.0–100.0)
Monocytes Absolute: 0.8 10*3/uL (ref 0.1–1.0)
Monocytes Relative: 5 %
Neutro Abs: 12.8 10*3/uL — ABNORMAL HIGH (ref 1.7–7.7)
Neutrophils Relative %: 86 %
Platelet Count: 298 10*3/uL (ref 150–400)
RBC: 4.45 MIL/uL (ref 3.87–5.11)
RDW: 13.2 % (ref 11.5–15.5)
WBC Count: 15 10*3/uL — ABNORMAL HIGH (ref 4.0–10.5)
nRBC: 0 % (ref 0.0–0.2)

## 2022-02-20 LAB — CMP (CANCER CENTER ONLY)
ALT: 31 U/L (ref 0–44)
AST: 24 U/L (ref 15–41)
Albumin: 4.4 g/dL (ref 3.5–5.0)
Alkaline Phosphatase: 98 U/L (ref 38–126)
Anion gap: 10 (ref 5–15)
BUN: 12 mg/dL (ref 8–23)
CO2: 25 mmol/L (ref 22–32)
Calcium: 9.5 mg/dL (ref 8.9–10.3)
Chloride: 105 mmol/L (ref 98–111)
Creatinine: 0.74 mg/dL (ref 0.44–1.00)
GFR, Estimated: >60 mL/min (ref >60)
Glucose, Bld: 172 mg/dL — ABNORMAL HIGH (ref 70–99)
Potassium: 3.5 mmol/L (ref 3.5–5.1)
Sodium: 140 mmol/L (ref 135–145)
Total Bilirubin: 0.4 mg/dL (ref 0.3–1.2)
Total Protein: 7.6 g/dL (ref 6.5–8.1)

## 2022-02-20 MED ORDER — SODIUM CHLORIDE 0.9% FLUSH
10.0000 mL | INTRAVENOUS | Status: DC | PRN
  Administered 2022-02-20: 10 mL

## 2022-02-20 MED ORDER — PEGFILGRASTIM 6 MG/0.6ML ~~LOC~~ PSKT
6.0000 mg | PREFILLED_SYRINGE | Freq: Once | SUBCUTANEOUS | Status: AC
  Administered 2022-02-20: 6 mg via SUBCUTANEOUS
  Filled 2022-02-20: qty 0.6

## 2022-02-20 MED ORDER — SODIUM CHLORIDE 0.9% FLUSH
10.0000 mL | INTRAVENOUS | Status: DC | PRN
  Administered 2022-02-20: 10 mL via INTRAVENOUS

## 2022-02-20 MED ORDER — HEPARIN SOD (PORK) LOCK FLUSH 100 UNIT/ML IV SOLN
500.0000 [IU] | Freq: Once | INTRAVENOUS | Status: AC | PRN
  Administered 2022-02-20: 500 [IU]

## 2022-02-20 MED ORDER — SODIUM CHLORIDE 0.9 % IV SOLN: Freq: Once | INTRAVENOUS | Status: AC

## 2022-02-20 MED ORDER — SODIUM CHLORIDE 0.9 % IV SOLN
600.0000 mg/m2 | Freq: Once | INTRAVENOUS | Status: AC
  Administered 2022-02-20: 1140 mg via INTRAVENOUS
  Filled 2022-02-20: qty 19

## 2022-02-20 MED ORDER — PALONOSETRON HCL INJECTION 0.25 MG/5ML
0.2500 mg | Freq: Once | INTRAVENOUS | Status: AC
  Administered 2022-02-20: 0.25 mg via INTRAVENOUS
  Filled 2022-02-20: qty 5

## 2022-02-20 MED ORDER — SODIUM CHLORIDE 0.9 % IV SOLN
75.0000 mg/m2 | Freq: Once | INTRAVENOUS | Status: AC
  Administered 2022-02-20: 140 mg via INTRAVENOUS
  Filled 2022-02-20: qty 14

## 2022-02-20 MED ORDER — SODIUM CHLORIDE 0.9 % IV SOLN
10.0000 mg | Freq: Once | INTRAVENOUS | Status: AC
  Administered 2022-02-20: 10 mg via INTRAVENOUS
  Filled 2022-02-20: qty 10

## 2022-02-20 NOTE — Patient Instructions (Signed)
[facility]  Discharge Instructions: Thank you for choosing [facility] to provide your oncology and hematology care.   If you have a lab appointment with the [facility], please go directly to the [facility] and check in at the registration area.   Wear comfortable clothing and clothing appropriate for easy access to any Portacath or PICC line.   We strive to give you quality time with your provider. You may need to reschedule your appointment if you arrive late (15 or more minutes).  Arriving late affects you and other patients whose appointments are after yours.  Also, if you miss three or more appointments without notifying the office, you may be dismissed from the clinic at the provider's discretion.      For prescription refill requests, have your pharmacy contact our office and allow 72 hours for refills to be completed.    Today you received the following chemotherapy and/or immunotherapy agents: Taxotere, cytoxan, neulasta onpro      To help prevent nausea and vomiting after your treatment, we encourage you to take your nausea medication as directed.  BELOW ARE SYMPTOMS THAT SHOULD BE REPORTED IMMEDIATELY: *FEVER GREATER THAN 100.4 F (38 C) OR HIGHER *CHILLS OR SWEATING *NAUSEA AND VOMITING THAT IS NOT CONTROLLED WITH YOUR NAUSEA MEDICATION *UNUSUAL SHORTNESS OF BREATH *UNUSUAL BRUISING OR BLEEDING *URINARY PROBLEMS (pain or burning when urinating, or frequent urination) *BOWEL PROBLEMS (unusual diarrhea, constipation, pain near the anus) TENDERNESS IN MOUTH AND THROAT WITH OR WITHOUT PRESENCE OF ULCERS (sore throat, sores in mouth, or a toothache) UNUSUAL RASH, SWELLING OR PAIN  UNUSUAL VAGINAL DISCHARGE OR ITCHING   Items with * indicate a potential emergency and should be followed up as soon as possible or go to the Emergency Department if any problems should occur.  Please show the CHEMOTHERAPY ALERT CARD or IMMUNOTHERAPY  ALERT CARD at check-in to the Emergency Department and triage nurse.  Should you have questions after your visit or need to cancel or reschedule your appointment, please contact [facility]  Dept: [phone]  and follow the prompts.  Office hours are 8:00 a.m. to 4:30 p.m. Monday - Friday. Please note that voicemails left after 4:00 p.m. may not be returned until the following business day.  We are closed weekends and major holidays. You have access to a nurse at all times for urgent questions. Please call the main number to the clinic Dept: [phone] and follow the prompts.   For any non-urgent questions, you may also contact your provider using MyChart. We now offer e-Visits for anyone 38 and older to request care online for non-urgent symptoms. For details visit [email or web address].   Also download the MyChart app! Go to the app store, search "MyChart", open the app, select [facility], and log in with your MyChart username and password.  Masks are optional in the cancer centers. If you would like for your care team to wear a mask while they are taking care of you, please let them know. You may have one support person who is at least 62 years old accompany you for your appointments. Docetaxel Injection What is this medication? DOCETAXEL (doe se TAX el) treats some types of cancer. It works by slowing down the growth of cancer cells. This medicine may be used for other purposes; ask your health care provider or pharmacist if you have questions. COMMON BRAND NAME(S): Docefrez, Taxotere What should I tell my care team before I  take this medication? They need to know if you have any of these conditions: Kidney disease Liver disease Low white blood cell levels Tingling of the fingers or toes or other nerve disorder An unusual or allergic reaction to docetaxel, polysorbate 80, other medications, foods, dyes, or preservatives Pregnant or trying to get  pregnant Breast-feeding How should I use this medication? This medication is injected into a vein. It is given by your care team in a hospital or clinic setting. Talk to your care team about the use of this medication in children. Special care may be needed. Overdosage: If you think you have taken too much of this medicine contact a poison control center or emergency room at once. NOTE: This medicine is only for you. Do not share this medicine with others. What if I miss a dose? Keep appointments for follow-up doses. It is important not to miss your dose. Call your care team if you are unable to keep an appointment. What may interact with this medication? Do not take this medication with any of the following: Live virus vaccines This medication may also interact with the following: Certain antibiotics, such as clarithromycin, telithromycin Certain antivirals for HIV or hepatitis Certain medications for fungal infections, such as itraconazole, ketoconazole, voriconazole Grapefruit juice Nefazodone Supplements, such as St. John's wort This list may not describe all possible interactions. Give your health care provider a list of all the medicines, herbs, non-prescription drugs, or dietary supplements you use. Also tell them if you smoke, drink alcohol, or use illegal drugs. Some items may interact with your medicine. What should I watch for while using this medication? This medication may make you feel generally unwell. This is not uncommon as chemotherapy can affect healthy cells as well as cancer cells. Report any side effects. Continue your course of treatment even though you feel ill unless your care team tells you to stop. You may need blood work done while you are taking this medication. This medication can cause serious side effects and infusion reactions. To reduce the risk, your care team may give you other medications to take before receiving this one. Be sure to follow the directions  from your care team. This medication may increase your risk of getting an infection. Call your care team for advice if you get a fever, chills, sore throat, or other symptoms of a cold or flu. Do not treat yourself. Try to avoid being around people who are sick. Avoid taking medications that contain aspirin, acetaminophen, ibuprofen, naproxen, or ketoprofen unless instructed by your care team. These medications may hide a fever. Be careful brushing or flossing your teeth or using a toothpick because you may get an infection or bleed more easily. If you have any dental work done, tell your dentist you are receiving this medication. Some products may contain alcohol. Ask your care team if this medication contains alcohol. Be sure to tell all care teams you are taking this medicine. Certain medications, like metronidazole and disulfiram, can cause an unpleasant reaction when taken with alcohol. The reaction includes flushing, headache, nausea, vomiting, sweating, and increased thirst. The reaction can last from 30 minutes to several hours. This medication may affect your coordination, reaction time, or judgement. Do not drive or operate machinery until you know how this medication affects you. Sit up or stand slowly to reduce the risk of dizzy or fainting spells. Drinking alcohol with this medication can increase the risk of these side effects. Talk to your care team about your  risk of cancer. You may be more at risk for certain types of cancer if you take this medication. Talk to your care team if you wish to become pregnant or think you might be pregnant. This medication can cause serious birth defects if taken during pregnancy or if you get pregnant within 2 months after stopping therapy. A negative pregnancy test is required before starting this medication. A reliable form of contraception is recommended while taking this medication and for 2 months after stopping it. Talk to your care team about reliable  forms of contraception. Do not breast-feed while taking this medication and for 1 week after stopping therapy. Use a condom during sex and for 4 months after stopping therapy. Tell your care team right away if you think your partner might be pregnant. This medication can cause serious birth defects. This medication may cause infertility. Talk to your care team if you are concerned about your fertility. What side effects may I notice from receiving this medication? Side effects that you should report to your care team as soon as possible: Allergic reactions--skin rash, itching, hives, swelling of the face, lips, tongue, or throat Change in vision such as blurry vision, seeing halos around lights, vision loss Infection--fever, chills, cough, or sore throat Infusion reactions--chest pain, shortness of breath or trouble breathing, feeling faint or lightheaded Low red blood cell level--unusual weakness or fatigue, dizziness, headache, trouble breathing Pain, tingling, or numbness in the hands or feet Painful swelling, warmth, or redness of the skin, blisters or sores at the infusion site Redness, blistering, peeling, or loosening of the skin, including inside the mouth Sudden or severe stomach pain, bloody diarrhea, fever, nausea, vomiting Swelling of the ankles, hands, or feet Tumor lysis syndrome (TLS)--nausea, vomiting, diarrhea, decrease in the amount of urine, dark urine, unusual weakness or fatigue, confusion, muscle pain or cramps, fast or irregular heartbeat, joint pain Unusual bruising or bleeding Side effects that usually do not require medical attention (report to your care team if they continue or are bothersome): Change in nail shape, thickness, or color Change in taste Hair loss Increased tears This list may not describe all possible side effects. Call your doctor for medical advice about side effects. You may report side effects to FDA at 1-800-FDA-1088. Where should I keep my  medication? This medication is given in a hospital or clinic. It will not be stored at home. NOTE: This sheet is a summary. It may not cover all possible information. If you have questions about this medicine, talk to your doctor, pharmacist, or health care provider.  2023 Elsevier/Gold Standard (2007-05-22 00:00:00) Cyclophosphamide Injection What is this medication? CYCLOPHOSPHAMIDE (sye kloe FOSS fa mide) treats some types of cancer. It works by slowing down the growth of cancer cells. This medicine may be used for other purposes; ask your health care provider or pharmacist if you have questions. COMMON BRAND NAME(S): Cyclophosphamide, Cytoxan, Neosar What should I tell my care team before I take this medication? They need to know if you have any of these conditions: Heart disease Irregular heartbeat or rhythm Infection Kidney problems Liver disease Low blood cell levels (white cells, platelets, or red blood cells) Lung disease Previous radiation Trouble passing urine An unusual or allergic reaction to cyclophosphamide, other medications, foods, dyes, or preservatives Pregnant or trying to get pregnant Breast-feeding How should I use this medication? This medication is injected into a vein. It is given by your care team in a hospital or clinic setting. Talk to your  care team about the use of this medication in children. Special care may be needed. Overdosage: If you think you have taken too much of this medicine contact a poison control center or emergency room at once. NOTE: This medicine is only for you. Do not share this medicine with others. What if I miss a dose? Keep appointments for follow-up doses. It is important not to miss your dose. Call your care team if you are unable to keep an appointment. What may interact with this medication? Amphotericin B Amiodarone Azathioprine Certain antivirals for HIV or hepatitis Certain medications for blood pressure, such as  enalapril, lisinopril, quinapril Cyclosporine Diuretics Etanercept Indomethacin Medications that relax muscles Metronidazole Natalizumab Tamoxifen Warfarin This list may not describe all possible interactions. Give your health care provider a list of all the medicines, herbs, non-prescription drugs, or dietary supplements you use. Also tell them if you smoke, drink alcohol, or use illegal drugs. Some items may interact with your medicine. What should I watch for while using this medication? This medication may make you feel generally unwell. This is not uncommon as chemotherapy can affect healthy cells as well as cancer cells. Report any side effects. Continue your course of treatment even though you feel ill unless your care team tells you to stop. You may need blood work while you are taking this medication. This medication may increase your risk of getting an infection. Call your care team for advice if you get a fever, chills, sore throat, or other symptoms of a cold or flu. Do not treat yourself. Try to avoid being around people who are sick. Avoid taking medications that contain aspirin, acetaminophen, ibuprofen, naproxen, or ketoprofen unless instructed by your care team. These medications may hide a fever. Be careful brushing or flossing your teeth or using a toothpick because you may get an infection or bleed more easily. If you have any dental work done, tell your dentist you are receiving this medication. Drink water or other fluids as directed. Urinate often, even at night. Some products may contain alcohol. Ask your care team if this medication contains alcohol. Be sure to tell all care teams you are taking this medicine. Certain medicines, like metronidazole and disulfiram, can cause an unpleasant reaction when taken with alcohol. The reaction includes flushing, headache, nausea, vomiting, sweating, and increased thirst. The reaction can last from 30 minutes to several hours. Talk to  your care team if you wish to become pregnant or think you might be pregnant. This medication can cause serious birth defects if taken during pregnancy and for 1 year after the last dose. A negative pregnancy test is required before starting this medication. A reliable form of contraception is recommended while taking this medication and for 1 year after the last dose. Talk to your care team about reliable forms of contraception. Do not father a child while taking this medication and for 4 months after the last dose. Use a condom during this time period. Do not breast-feed while taking this medication or for 1 week after the last dose. This medication may cause infertility. Talk to your care team if you are concerned about your fertility. Talk to your care team about your risk of cancer. You may be more at risk for certain types of cancer if you take this medication. What side effects may I notice from receiving this medication? Side effects that you should report to your care team as soon as possible: Allergic reactions--skin rash, itching, hives, swelling of the  face, lips, tongue, or throat Dry cough, shortness of breath or trouble breathing Heart failure--shortness of breath, swelling of the ankles, feet, or hands, sudden weight gain, unusual weakness or fatigue Heart muscle inflammation--unusual weakness or fatigue, shortness of breath, chest pain, fast or irregular heartbeat, dizziness, swelling of the ankles, feet, or hands Heart rhythm changes--fast or irregular heartbeat, dizziness, feeling faint or lightheaded, chest pain, trouble breathing Infection--fever, chills, cough, sore throat, wounds that don't heal, pain or trouble when passing urine, general feeling of discomfort or being unwell Kidney injury--decrease in the amount of urine, swelling of the ankles, hands, or feet Liver injury--right upper belly pain, loss of appetite, nausea, light-colored stool, dark yellow or brown urine,  yellowing skin or eyes, unusual weakness or fatigue Low red blood cell level--unusual weakness or fatigue, dizziness, headache, trouble breathing Low sodium level--muscle weakness, fatigue, dizziness, headache, confusion Red or dark brown urine Unusual bruising or bleeding Side effects that usually do not require medical attention (report to your care team if they continue or are bothersome): Hair loss Irregular menstrual cycles or spotting Loss of appetite Nausea Pain, redness, or swelling with sores inside the mouth or throat Vomiting This list may not describe all possible side effects. Call your doctor for medical advice about side effects. You may report side effects to FDA at 1-800-FDA-1088. Where should I keep my medication? This medication is given in a hospital or clinic. It will not be stored at home. NOTE: This sheet is a summary. It may not cover all possible information. If you have questions about this medicine, talk to your doctor, pharmacist, or health care provider.  2023 Elsevier/Gold Standard (2021-05-21 00:00:00) Pegfilgrastim Injection What is this medication? PEGFILGRASTIM (PEG fil gra stim) lowers the risk of infection in people who are receiving chemotherapy. It works by Building control surveyor make more white blood cells, which protects your body from infection. It may also be used to help people who have been exposed to high doses of radiation. This medicine may be used for other purposes; ask your health care provider or pharmacist if you have questions. COMMON BRAND NAME(S): [place], Neulasta, Nyvepria, Stimufend, UDENYCA, Ziextenzo What should I tell my care team before I take this medication? They need to know if you have any of these conditions: Kidney disease Latex allergy Ongoing radiation therapy Sickle cell disease Skin reactions to acrylic adhesives (On-Body Injector only) An unusual or allergic reaction to pegfilgrastim, filgrastim, other  medications, foods, dyes, or preservatives Pregnant or trying to get pregnant Breast-feeding How should I use this medication? This medication is for injection under the skin. If you get this medication at home, you will be taught how to prepare and give the pre-filled syringe or how to use the On-body Injector. Refer to the patient Instructions for Use for detailed instructions. Use exactly as directed. Tell your care team immediately if you suspect that the On-body Injector may not have performed as intended or if you suspect the use of the On-body Injector resulted in a missed or partial dose. It is important that you put your used needles and syringes in a special sharps container. Do not put them in a trash can. If you do not have a sharps container, call your pharmacist or care team to get one. Talk to your care team about the use of this medication in children. While this medication may be prescribed for selected conditions, precautions do apply. Overdosage: If you think you have taken too much of this  medicine contact a poison control center or emergency room at once. NOTE: This medicine is only for you. Do not share this medicine with others. What if I miss a dose? It is important not to miss your dose. Call your care team if you miss your dose. If you miss a dose due to an On-body Injector failure or leakage, a new dose should be administered as soon as possible using a single prefilled syringe for manual use. What may interact with this medication? Interactions have not been studied. This list may not describe all possible interactions. Give your health care provider a list of all the medicines, herbs, non-prescription drugs, or dietary supplements you use. Also tell them if you smoke, drink alcohol, or use illegal drugs. Some items may interact with your medicine. What should I watch for while using this medication? Your condition will be monitored carefully while you are receiving this  medication. You may need blood work done while you are taking this medication. Talk to your care team about your risk of cancer. You may be more at risk for certain types of cancer if you take this medication. If you are going to need a MRI, CT scan, or other procedure, tell your care team that you are using this medication (On-Body Injector only). What side effects may I notice from receiving this medication? Side effects that you should report to your care team as soon as possible: Allergic reactions--skin rash, itching, hives, swelling of the face, lips, tongue, or throat Capillary leak syndrome--stomach or muscle pain, unusual weakness or fatigue, feeling faint or lightheaded, decrease in the amount of urine, swelling of the ankles, hands, or feet, trouble breathing High white blood cell level--fever, fatigue, trouble breathing, night sweats, change in vision, weight loss Inflammation of the aorta--fever, fatigue, back, chest, or stomach pain, severe headache Kidney injury (glomerulonephritis)--decrease in the amount of urine, red or dark brown urine, foamy or bubbly urine, swelling of the ankles, hands, or feet Shortness of breath or trouble breathing Spleen injury--pain in upper left stomach or shoulder Unusual bruising or bleeding Side effects that usually do not require medical attention (report to your care team if they continue or are bothersome): Bone pain Pain in the hands or feet This list may not describe all possible side effects. Call your doctor for medical advice about side effects. You may report side effects to FDA at 1-800-FDA-1088. Where should I keep my medication? Keep out of the reach of children. If you are using this medication at home, you will be instructed on how to store it. Throw away any unused medication after the expiration date on the label. NOTE: This sheet is a summary. It may not cover all possible information. If you have questions about this medicine,  talk to your doctor, pharmacist, or health care provider.  2023 Elsevier/Gold Standard (2020-10-18 00:00:00)

## 2022-02-21 ENCOUNTER — Other Ambulatory Visit: Payer: Self-pay

## 2022-02-21 ENCOUNTER — Telehealth: Payer: Self-pay | Admitting: *Deleted

## 2022-02-21 NOTE — Telephone Encounter (Signed)
Attempted to call pt for chemo follow up.  No answers.  Left message for reason of the call.  Asked pt to call office back on Monday to let us know how pt was doing at home post chemo.

## 2022-02-23 ENCOUNTER — Other Ambulatory Visit: Payer: Self-pay

## 2022-02-26 ENCOUNTER — Other Ambulatory Visit: Payer: Self-pay

## 2022-02-26 DIAGNOSIS — Z171 Estrogen receptor negative status [ER-]: Secondary | ICD-10-CM

## 2022-02-27 ENCOUNTER — Encounter: Payer: Self-pay | Admitting: Hematology

## 2022-02-27 ENCOUNTER — Inpatient Hospital Stay: Payer: Managed Care, Other (non HMO)

## 2022-02-27 ENCOUNTER — Inpatient Hospital Stay (HOSPITAL_BASED_OUTPATIENT_CLINIC_OR_DEPARTMENT_OTHER): Payer: Managed Care, Other (non HMO) | Admitting: Hematology

## 2022-02-27 VITALS — BP 106/72 | HR 105 | Temp 99.0°F | Resp 16 | Ht 67.5 in | Wt 168.4 lb

## 2022-02-27 DIAGNOSIS — Z171 Estrogen receptor negative status [ER-]: Secondary | ICD-10-CM | POA: Diagnosis not present

## 2022-02-27 DIAGNOSIS — C50411 Malignant neoplasm of upper-outer quadrant of right female breast: Secondary | ICD-10-CM

## 2022-02-27 DIAGNOSIS — Z95828 Presence of other vascular implants and grafts: Secondary | ICD-10-CM

## 2022-02-27 DIAGNOSIS — Z5111 Encounter for antineoplastic chemotherapy: Secondary | ICD-10-CM | POA: Diagnosis not present

## 2022-02-27 LAB — CMP (CANCER CENTER ONLY)
ALT: 35 U/L (ref 0–44)
AST: 21 U/L (ref 15–41)
Albumin: 4.3 g/dL (ref 3.5–5.0)
Alkaline Phosphatase: 141 U/L — ABNORMAL HIGH (ref 38–126)
Anion gap: 10 (ref 5–15)
BUN: 6 mg/dL — ABNORMAL LOW (ref 8–23)
CO2: 27 mmol/L (ref 22–32)
Calcium: 9.7 mg/dL (ref 8.9–10.3)
Chloride: 101 mmol/L (ref 98–111)
Creatinine: 0.7 mg/dL (ref 0.44–1.00)
GFR, Estimated: >60 mL/min (ref >60)
Glucose, Bld: 138 mg/dL — ABNORMAL HIGH (ref 70–99)
Potassium: 3.9 mmol/L (ref 3.5–5.1)
Sodium: 138 mmol/L (ref 135–145)
Total Bilirubin: 0.5 mg/dL (ref 0.3–1.2)
Total Protein: 7.4 g/dL (ref 6.5–8.1)

## 2022-02-27 LAB — CBC WITH DIFFERENTIAL (CANCER CENTER ONLY)
Abs Immature Granulocytes: 2.95 10*3/uL — ABNORMAL HIGH (ref 0.00–0.07)
Basophils Absolute: 0.1 10*3/uL (ref 0.0–0.1)
Basophils Relative: 0 %
Eosinophils Absolute: 0.1 10*3/uL (ref 0.0–0.5)
Eosinophils Relative: 1 %
HCT: 41.1 % (ref 36.0–46.0)
Hemoglobin: 13.6 g/dL (ref 12.0–15.0)
Immature Granulocytes: 15 %
Lymphocytes Relative: 15 %
Lymphs Abs: 2.9 10*3/uL (ref 0.7–4.0)
MCH: 30.7 pg (ref 26.0–34.0)
MCHC: 33.1 g/dL (ref 30.0–36.0)
MCV: 92.8 fL (ref 80.0–100.0)
Monocytes Absolute: 2.9 10*3/uL — ABNORMAL HIGH (ref 0.1–1.0)
Monocytes Relative: 15 %
Neutro Abs: 10.6 10*3/uL — ABNORMAL HIGH (ref 1.7–7.7)
Neutrophils Relative %: 54 %
Platelet Count: 161 10*3/uL (ref 150–400)
RBC: 4.43 MIL/uL (ref 3.87–5.11)
RDW: 13.2 % (ref 11.5–15.5)
Smear Review: NORMAL
WBC Count: 19.5 10*3/uL — ABNORMAL HIGH (ref 4.0–10.5)
nRBC: 0.1 % (ref 0.0–0.2)

## 2022-02-27 MED ORDER — HEPARIN SOD (PORK) LOCK FLUSH 100 UNIT/ML IV SOLN
500.0000 [IU] | Freq: Once | INTRAVENOUS | Status: AC
  Administered 2022-02-27: 500 [IU]

## 2022-02-27 MED ORDER — SODIUM CHLORIDE 0.9% FLUSH
10.0000 mL | Freq: Once | INTRAVENOUS | Status: AC
  Administered 2022-02-27: 10 mL

## 2022-02-27 NOTE — Progress Notes (Signed)
Samantha [name]   Telephone:(336) [phone] Fax:(336) [phone]   Clinic Follow up Note   Patient Care Team: Crist Infante, MD as PCP - General (Internal Medicine) Mauro Kaufmann, RN as Oncology Nurse Navigator [name], RN as Oncology Nurse Navigator Stark Klein, MD as Consulting Physician (General Surgery) Truitt Merle, MD as Consulting Physician (Hematology) Kyung Rudd, MD as Consulting Physician (Radiation Oncology) Marcene Corning, MD as Consulting Physician (Endocrinology) Stefanie Libel, MD as Consulting Physician ([place]) Marylynn Pearson, MD as Consulting Physician (Obstetrics and Gynecology)  Date of Service:  02/27/2022  CHIEF COMPLAINT: f/u of right breast cancer  CURRENT THERAPY:  Adjuvant TC, q21d, starting 02/20/22  ASSESSMENT & PLAN:  Samantha [name] is a 61 y.o. post-menopausal female with   1. Malignant neoplasm of upper-outer quadrant of right breast, IDC and DCIS, Stage IA, pT1b, cN0, triple negative, Grade 3 -found on screening mammogram. S/p lumpectomy on 01/08/22 with Dr. Barry Dienes, path showed: 8 mm invasive and in situ ductal carcinoma with negative margins. Repeat prognostic panel confirmed triple negative disease.  -lymph node biopsies 01/28/22 showed negative nodes (0/2). Port placed at time of procedure. Postoperative course complicated by pneumothorax, which has resolved. -she began adjuvant TC on 02/20/22, plan for 4 cycles. She did well with chemo but experienced significant bone pain and low-grade fever from Neulasta. -labs reviewed, WBC 19.5. We discussed holding Neulasta with next cycle. I explained the potential for neutropenic fever and advised her to call if she develops a fever over 100F. She will call her insurance to cancel future home injections.  2. Severe headache and bone pain -Likely secondary to Neulasta -Plan to hold G-CSF after next cycle chemotherapy -We discussed pain management, her pain is better now, she can  continue Tylenol or ibuprofen as needed.     PLAN: -lab, flush, f/u, and TC on 11/30 and 12/21 as scheduled, no GCSF with next cycle    No problem-specific Assessment & Plan notes found for this encounter.   SUMMARY OF ONCOLOGIC HISTORY: Oncology History  Malignant neoplasm of upper-outer quadrant of right breast in female, estrogen receptor negative ([place])  12/06/2021 Mammogram   CLINICAL DATA:  Patient returns today to evaluate RIGHT breast calcifications identified on a recent screening mammogram.   EXAM: DIGITAL DIAGNOSTIC UNILATERAL RIGHT MAMMOGRAM  IMPRESSION: Grouped coarse heterogeneous calcifications within the upper-outer quadrant of the RIGHT breast, measuring 6 mm extent. These may be fibroadenomatous calcifications. Stereotactic biopsy is recommended to exclude malignancy.   12/18/2021 Initial Biopsy   Diagnosis Breast, right, needle core biopsy, upper outer quadrant, x clip - DUCTAL CARCINOMA IN SITU, SOLID AND CRIBRIFORM TYPE WITH COMEDONECROSIS AND ASSOCIATED CALCIFICATIONS, NUCLEAR GRADE 3 OF 3 - FOCAL MICROINVASION IS PRESENT (LESS THAN 1 MM) WITH EVIDENCE OF LYMPHOVASCULAR INVASION - NECROSIS: PRESENT - CALCIFICATIONS: PRESENT - DCIS LENGTH: 7 MM IN GREATEST LINEAR DIMENSION ON FRAGMENTED CORES  PROGNOSTIC INDICATORS Results: IMMUNOHISTOCHEMICAL AND MORPHOMETRIC ANALYSIS PERFORMED MANUALLY The tumor cells are NEGATIVE for Her2 (1+). Estrogen Receptor: 0%, NEGATIVE Progesterone Receptor: 0%, NEGATIVE Proliferation Marker Ki67: 60%   12/23/2021 Initial Diagnosis   Malignant neoplasm of upper-outer quadrant of right breast in female, estrogen receptor negative ([place])   12/23/2021 Imaging   EXAM: ULTRASOUND OF THE RIGHT AXILLA  IMPRESSION: No abnormal appearing RIGHT axillary lymph nodes.   01/08/2022 Cancer Staging   Staging form: Breast, AJCC 8th Edition - Pathologic stage from 01/08/2022: Stage IB (pT1b, pN0, cM0, G3, ER-, PR-, HER2-) - Signed by Burr Medico,  Krista Blue, MD on 01/24/2022 Stage prefix: Initial diagnosis Histologic grading system: 3 grade system Residual tumor (R): R0 - None   02/20/2022 -  Chemotherapy   Patient is on Treatment Plan : BREAST TC q21d        INTERVAL HISTORY:  Samantha [name] is here for a follow up of breast cancer. She was last seen by me on 02/14/22. She presents to the clinic alone. She reports chemo went well, and her main side effect is from the GCF-S injection. She reports she is still having bone pain, worse in her skull. She explains it includes her sinuses. She notes she also developed a low-grade fever of 99.9F, prompting her to reach out to her PCP. She was tested for Covid and flu yesterday, all were negative. She reports tylenol and Aleve were not helpful. She notes her pain was previously rated 8/10, down to 5/10 today. She reports she has developed tinnitus.  She notes she is using magic mouthwash.   All other systems were reviewed with the patient and are negative.  MEDICAL HISTORY:  Past Medical History:  Diagnosis Date   Allergy    Anxiety 2015   Result of culmination of super stressful caregiving and deaths of 2 immediate family members.  Overall do pretty well.   Cancer [facility]) 2023   right breast   Depression    GERD (gastroesophageal reflux disease)    Hypothyroidism    Right carpal tunnel syndrome 09/19/2019   Thyroid disease     SURGICAL HISTORY: Past Surgical History:  Procedure Laterality Date   BREAST LUMPECTOMY WITH RADIOACTIVE SEED LOCALIZATION Right 01/08/2022   Procedure: RIGHT BREAST LUMPECTOMY WITH RADIOACTIVE SEED LOCALIZATION;  Surgeon: Stark Klein, MD;  Location: [place];  Service: General;  Laterality: Right;   PORTACATH PLACEMENT Left 01/28/2022   Procedure: INSERTION PORT-A-CATH;  Surgeon: Stark Klein, MD;  Location: [place];  Service: General;  Laterality: Left;   SENTINEL NODE BIOPSY Right 01/28/2022   Procedure: RIGHT SENTINEL LYMPH NODE BIOPSY;   Surgeon: Stark Klein, MD;  Location: [place];  Service: General;  Laterality: Right;   TONSILLECTOMY      I have reviewed the social history and family history with the patient and they are unchanged from previous note.  ALLERGIES:  is allergic to nortriptyline, singulair [montelukast sodium], sulfonamide derivatives, amitriptyline hcl, esomeprazole, gabapentin, lexapro [escitalopram], and neosporin [bacitracin-polymyxin b].  MEDICATIONS:  Current Outpatient Medications  Medication Sig Dispense Refill   acetaminophen (TYLENOL) 500 MG tablet Take 1,000 mg by mouth every 6 (six) hours as needed for moderate pain.     Calcium Carb-Cholecalciferol (CALCIUM-VITAMIN D3) 250-125 MG-UNIT TABS Take 1 tablet once a day     Cholecalciferol (VITAMIN D3) 1000 units CAPS Take 1,000 Units by mouth daily.     dexamethasone (DECADRON) 4 MG tablet Take 2 tabs by mouth 2 times daily starting day before chemo. Then take 2 tabs daily for 2 days starting day after chemo. Take with food. (Patient not taking: Reported on 01/28/2022) 30 tablet 1   EPINEPHrine 0.3 mg/0.3 mL IJ SOAJ injection Inject 0.3 mg into the muscle as needed for anaphylaxis. 2 each 1   famotidine (PEPCID) 20 MG tablet Take 1 tablet (20 mg total) by mouth daily.     lidocaine-prilocaine (EMLA) cream Apply to affected area once (Patient not taking: Reported on 01/28/2022) 30 g 3   LORazepam (ATIVAN) 0.5 MG tablet Take 0.25-0.5 mg by mouth 2 (two) times daily as  needed for anxiety.     ondansetron (ZOFRAN) 8 MG tablet Take 1 tablet (8 mg total) by mouth every 8 (eight) hours as needed for nausea or vomiting. Start on the third day after chemotherapy. (Patient not taking: Reported on 01/28/2022) 30 tablet 1   oxyCODONE (OXY IR/ROXICODONE) 5 MG immediate release tablet Take 1 tablet (5 mg total) by mouth every 6 (six) hours as needed for severe pain. (Patient not taking: Reported on 01/28/2022) 5 tablet 0   prochlorperazine  (COMPAZINE) 10 MG tablet Take 1 tablet (10 mg total) by mouth every 6 (six) hours as needed for nausea or vomiting. (Patient not taking: Reported on 01/28/2022) 30 tablet 1   sertraline (ZOLOFT) 50 MG tablet Take 50 mg by mouth daily.     TIROSINT 100 MCG CAPS Take 100 mcg by mouth every Sunday.     TIROSINT 88 MCG CAPS Take 88 mcg by mouth See admin instructions. Take 88 mcg by mouth daily Monday-Saturday  10   No current facility-administered medications for this visit.    PHYSICAL EXAMINATION: ECOG PERFORMANCE STATUS: 1 - Symptomatic but completely ambulatory  Vitals:   02/27/22 0845  BP: 106/72  Pulse: (!) 105  Resp: 16  Temp: 99 F (37.2 C)  SpO2: 99%   Wt Readings from Last 3 Encounters:  02/27/22 168 lb 6.4 oz (76.4 kg)  02/20/22 169 lb 4 oz (76.8 kg)  02/14/22 169 lb 1.6 oz (76.7 kg)     GENERAL:alert, no distress and comfortable SKIN: skin color, texture, turgor are normal, no rashes or significant lesions EYES: normal, Conjunctiva are pink and non-injected, sclera clear OROPHARYNX:no exudate, no erythema and lips, buccal mucosa, and tongue normal LUNGS: clear to auscultation and percussion with normal breathing effort HEART: regular rate & rhythm and no murmurs and no lower extremity edema NEURO: alert & oriented x 3 with fluent speech, no focal motor/sensory deficits  LABORATORY DATA:  I have reviewed the data as listed    Latest Ref Rng & Units 02/27/2022    8:26 AM 02/20/2022    9:40 AM 01/28/2022    6:19 PM  CBC  WBC 4.0 - 10.5 K/uL 19.5  15.0  10.7   Hemoglobin 12.0 - 15.0 g/dL 13.6  13.6  14.1   Hematocrit 36.0 - 46.0 % 41.1  41.2  42.1   Platelets 150 - 400 K/uL 161  298  282         Latest Ref Rng & Units 02/27/2022    8:26 AM 02/20/2022    9:40 AM 01/28/2022    6:19 PM  CMP  Glucose 70 - 99 mg/dL 138  172    BUN 8 - 23 mg/dL 6  12    Creatinine 0.44 - 1.00 mg/dL 0.70  0.74  0.70   Sodium 135 - 145 mmol/L 138  140    Potassium 3.5 - 5.1  mmol/L 3.9  3.5    Chloride 98 - 111 mmol/L 101  105    CO2 22 - 32 mmol/L 27  25    Calcium 8.9 - 10.3 mg/dL 9.7  9.5    Total Protein 6.5 - 8.1 g/dL 7.4  7.6    Total Bilirubin 0.3 - 1.2 mg/dL 0.5  0.4    Alkaline Phos 38 - 126 U/L 141  98    AST 15 - 41 U/L 21  24    ALT 0 - 44 U/L 35  31        RADIOGRAPHIC  STUDIES: I have personally reviewed the radiological images as listed and agreed with the findings in the report. No results found.    No orders of the defined types were placed in this encounter.  All questions were answered. The patient knows to call the clinic with any problems, questions or concerns. No barriers to learning was detected. The total time spent in the appointment was 25 minutes.     Truitt Merle, MD 02/27/2022   I, Wilburn Mylar, am acting as scribe for Truitt Merle, MD.   I have reviewed the above documentation for accuracy and completeness, and I agree with the above.

## 2022-03-04 ENCOUNTER — Encounter: Payer: Self-pay | Admitting: Hematology

## 2022-03-07 ENCOUNTER — Other Ambulatory Visit: Payer: Self-pay

## 2022-03-12 MED FILL — Dexamethasone Sodium Phosphate Inj 100 MG/10ML: INTRAMUSCULAR | Qty: 1 | Status: AC

## 2022-03-12 NOTE — Progress Notes (Unsigned)
[place]   Telephone:(336) [phone] Fax:(336) [phone]   Clinic Follow up Note   Patient Care Team: Crist Infante, MD as PCP - General (Internal Medicine) Mauro Kaufmann, RN as Oncology Nurse Navigator [name], RN as Oncology Nurse Navigator Stark Klein, MD as Consulting Physician (General Surgery) Truitt Merle, MD as Consulting Physician (Hematology) Kyung Rudd, MD as Consulting Physician (Radiation Oncology) Marcene Corning, MD as Consulting Physician (Endocrinology) Stefanie Libel, MD as Consulting Physician ([place]) Marylynn Pearson, MD as Consulting Physician (Obstetrics and Gynecology)  Date of Service:  03/13/2022  CHIEF COMPLAINT: f/u of right breast cancer    CURRENT THERAPY:  Adjuvant TC, q21d, starting 02/20/22    ASSESSMENT:  Samantha [name] is a 61 y.o. female with    1. Malignant neoplasm of upper-outer quadrant of right breast, IDC and DCIS, Stage IA, pT1b, cN0, triple negative, Grade 3 -found on screening mammogram. S/p lumpectomy on 01/08/22 with Dr. Barry Dienes, path showed: 8 mm invasive and in situ ductal carcinoma with negative margins. Repeat prognostic panel confirmed triple negative disease.  -lymph node biopsies 01/28/22 showed negative nodes (0/2). Port placed at time of procedure. Postoperative course complicated by pneumothorax, which has resolved. -she began adjuvant TC on 02/20/22, plan for 4 cycles. She did well with chemo but experienced significant bone pain and low-grade fever from Neulasta.   2. Severe headache and bone pain -Likely secondary to Neulasta -Plan to hold G-CSF after C2 chemotherapy   PLAN: - Labs reviewed -Proceed with Cycle 2 TC at full dose, no GCSF injection with this cycle  -Lab,flush, f/u and chemo TC on 1/11      SUMMARY OF ONCOLOGIC HISTORY: Oncology History  Malignant neoplasm of upper-outer quadrant of right breast in female, estrogen receptor negative ([place])  12/06/2021 Mammogram    CLINICAL DATA:  Patient returns today to evaluate RIGHT breast calcifications identified on a recent screening mammogram.   EXAM: DIGITAL DIAGNOSTIC UNILATERAL RIGHT MAMMOGRAM  IMPRESSION: Grouped coarse heterogeneous calcifications within the upper-outer quadrant of the RIGHT breast, measuring 6 mm extent. These may be fibroadenomatous calcifications. Stereotactic biopsy is recommended to exclude malignancy.   12/18/2021 Initial Biopsy   Diagnosis Breast, right, needle core biopsy, upper outer quadrant, x clip - DUCTAL CARCINOMA IN SITU, SOLID AND CRIBRIFORM TYPE WITH COMEDONECROSIS AND ASSOCIATED CALCIFICATIONS, NUCLEAR GRADE 3 OF 3 - FOCAL MICROINVASION IS PRESENT (LESS THAN 1 MM) WITH EVIDENCE OF LYMPHOVASCULAR INVASION - NECROSIS: PRESENT - CALCIFICATIONS: PRESENT - DCIS LENGTH: 7 MM IN GREATEST LINEAR DIMENSION ON FRAGMENTED CORES  PROGNOSTIC INDICATORS Results: IMMUNOHISTOCHEMICAL AND MORPHOMETRIC ANALYSIS PERFORMED MANUALLY The tumor cells are NEGATIVE for Her2 (1+). Estrogen Receptor: 0%, NEGATIVE Progesterone Receptor: 0%, NEGATIVE Proliferation Marker Ki67: 60%   12/23/2021 Initial Diagnosis   Malignant neoplasm of upper-outer quadrant of right breast in female, estrogen receptor negative ([place])   12/23/2021 Imaging   EXAM: ULTRASOUND OF THE RIGHT AXILLA  IMPRESSION: No abnormal appearing RIGHT axillary lymph nodes.   01/08/2022 Cancer Staging   Staging form: Breast, AJCC 8th Edition - Pathologic stage from 01/08/2022: Stage IB (pT1b, pN0, cM0, G3, ER-, PR-, HER2-) - Signed by Truitt Merle, MD on 01/24/2022 Stage prefix: Initial diagnosis Histologic grading system: 3 grade system Residual tumor (R): R0 - None   02/20/2022 -  Chemotherapy   Patient is on Treatment Plan : BREAST TC q21d        INTERVAL HISTORY:  Samantha [name] is here for a follow up of right breast  cancer   She was last seen by  me on 02/27/2022 She presents to the clinic alone.Pt states she did well  with the last tx ,except from the Neulasta.    All other systems were reviewed with the patient and are negative.  MEDICAL HISTORY:  Past Medical History:  Diagnosis Date   Allergy    Anxiety 2015   Result of culmination of super stressful caregiving and deaths of 2 immediate family members.  Overall do pretty well.   Cancer [facility]) 2023   right breast   Depression    GERD (gastroesophageal reflux disease)    Hypothyroidism    Right carpal tunnel syndrome 09/19/2019   Thyroid disease     SURGICAL HISTORY: Past Surgical History:  Procedure Laterality Date   BREAST LUMPECTOMY WITH RADIOACTIVE SEED LOCALIZATION Right 01/08/2022   Procedure: RIGHT BREAST LUMPECTOMY WITH RADIOACTIVE SEED LOCALIZATION;  Surgeon: Stark Klein, MD;  Location: [place];  Service: General;  Laterality: Right;   PORTACATH PLACEMENT Left 01/28/2022   Procedure: INSERTION PORT-A-CATH;  Surgeon: Stark Klein, MD;  Location: [place];  Service: General;  Laterality: Left;   SENTINEL NODE BIOPSY Right 01/28/2022   Procedure: RIGHT SENTINEL LYMPH NODE BIOPSY;  Surgeon: Stark Klein, MD;  Location: [place];  Service: General;  Laterality: Right;   TONSILLECTOMY      I have reviewed the social history and family history with the patient and they are unchanged from previous note.  ALLERGIES:  is allergic to nortriptyline, singulair [montelukast sodium], sulfonamide derivatives, amitriptyline hcl, esomeprazole, gabapentin, lexapro [escitalopram], and neosporin [bacitracin-polymyxin b].  MEDICATIONS:  Current Outpatient Medications  Medication Sig Dispense Refill   acetaminophen (TYLENOL) 500 MG tablet Take 1,000 mg by mouth every 6 (six) hours as needed for moderate pain.     Calcium Carb-Cholecalciferol (CALCIUM-VITAMIN D3) 250-125 MG-UNIT TABS Take 1 tablet once a day     Cholecalciferol (VITAMIN D3) 1000 units CAPS Take 1,000 Units by mouth daily.     dexamethasone (DECADRON) 4  MG tablet Take 2 tabs by mouth 2 times daily starting day before chemo. Then take 2 tabs daily for 2 days starting day after chemo. Take with food. (Patient not taking: Reported on 01/28/2022) 30 tablet 1   EPINEPHrine 0.3 mg/0.3 mL IJ SOAJ injection Inject 0.3 mg into the muscle as needed for anaphylaxis. 2 each 1   famotidine (PEPCID) 20 MG tablet Take 1 tablet (20 mg total) by mouth daily.     lidocaine-prilocaine (EMLA) cream Apply to affected area once (Patient not taking: Reported on 01/28/2022) 30 g 3   LORazepam (ATIVAN) 0.5 MG tablet Take 0.25-0.5 mg by mouth 2 (two) times daily as needed for anxiety.     ondansetron (ZOFRAN) 8 MG tablet Take 1 tablet (8 mg total) by mouth every 8 (eight) hours as needed for nausea or vomiting. Start on the third day after chemotherapy. (Patient not taking: Reported on 01/28/2022) 30 tablet 1   oxyCODONE (OXY IR/ROXICODONE) 5 MG immediate release tablet Take 1 tablet (5 mg total) by mouth every 6 (six) hours as needed for severe pain. (Patient not taking: Reported on 01/28/2022) 5 tablet 0   prochlorperazine (COMPAZINE) 10 MG tablet Take 1 tablet (10 mg total) by mouth every 6 (six) hours as needed for nausea or vomiting. (Patient not taking: Reported on 01/28/2022) 30 tablet 1   sertraline (ZOLOFT) 50 MG tablet Take 50 mg by mouth daily.     TIROSINT 100 MCG CAPS  Take 100 mcg by mouth every Sunday.     TIROSINT 88 MCG CAPS Take 88 mcg by mouth See admin instructions. Take 88 mcg by mouth daily Monday-Saturday  10   No current facility-administered medications for this visit.    PHYSICAL EXAMINATION: ECOG PERFORMANCE STATUS: 1 - Symptomatic but completely ambulatory  Vitals:   03/13/22 0957  BP: 122/76  Pulse: 81  Resp: 18  Temp: 98.2 F (36.8 C)  SpO2: 98%   Wt Readings from Last 3 Encounters:  03/13/22 169 lb 8 oz (76.9 kg)  02/27/22 168 lb 6.4 oz (76.4 kg)  02/20/22 169 lb 4 oz (76.8 kg)    GENERAL:alert, no distress and comfortable SKIN:  skin color, texture, turgor are normal, no rashes or significant lesions EYES: normal, Conjunctiva are pink and non-injected, sclera clear NECK: supple, thyroid normal size, non-tender, without nodularity LYMPH:  no palpable lymphadenopathy in the cervical, axillary  Breast normal LUNGS: clear to auscultation and percussion with normal breathing effort HEART: regular rate & rhythm and no murmurs and no lower extremity edema ABDOMEN:abdomen soft, non-tender and normal bowel sounds Musculoskeletal:no cyanosis of digits and no clubbing  NEURO: alert & oriented x 3 with fluent speech, no focal motor/sensory deficits  LABORATORY DATA:  I have reviewed the data as listed    Latest Ref Rng & Units 03/13/2022    9:39 AM 02/27/2022    8:26 AM 02/20/2022    9:40 AM  CBC  WBC 4.0 - 10.5 K/uL 13.7  19.5  15.0   Hemoglobin 12.0 - 15.0 g/dL 12.8  13.6  13.6   Hematocrit 36.0 - 46.0 % 38.5  41.1  41.2   Platelets 150 - 400 K/uL 575  161  298         Latest Ref Rng & Units 03/13/2022    9:39 AM 02/27/2022    8:26 AM 02/20/2022    9:40 AM  CMP  Glucose 70 - 99 mg/dL 172  138  172   BUN 8 - 23 mg/dL _0 Creatinine 0.44 - 1.00 mg/dL 0.56  0.70  0.74   Sodium 135 - 145 mmol/L 138  138  140   Potassium 3.5 - 5.1 mmol/L 3.7  3.9  3.5   Chloride 98 - 111 mmol/L 104  101  105   CO2 22 - 32 mmol/L _1 Calcium 8.9 - 10.3 mg/dL 9.7  9.7  9.5   Total Protein 6.5 - 8.1 g/dL 7.8  7.4  7.6   Total Bilirubin 0.3 - 1.2 mg/dL 0.4  0.5  0.4   Alkaline Phos 38 - 126 U/L 89  141  98   AST 15 - 41 U/L _2 ALT 0 - 44 U/L 25  35  31       RADIOGRAPHIC STUDIES: I have personally reviewed the radiological images as listed and agreed with the findings in the report. No results found.    No orders of the defined types were placed in this encounter.  All questions were answered. The patient knows to call the clinic with any problems, questions or concerns. No barriers to learning  was detected. The total time spent in the appointment was 30 minutes.     Truitt Merle, MD 03/13/2022   Felicity Coyer, CMA, am acting as scribe for Truitt Merle, MD.   I have reviewed the above documentation for accuracy and completeness,  and I agree with the above.

## 2022-03-13 ENCOUNTER — Inpatient Hospital Stay: Payer: Managed Care, Other (non HMO)

## 2022-03-13 ENCOUNTER — Other Ambulatory Visit: Payer: Self-pay

## 2022-03-13 ENCOUNTER — Inpatient Hospital Stay: Payer: Managed Care, Other (non HMO) | Admitting: Hematology

## 2022-03-13 DIAGNOSIS — Z171 Estrogen receptor negative status [ER-]: Secondary | ICD-10-CM

## 2022-03-13 DIAGNOSIS — C50411 Malignant neoplasm of upper-outer quadrant of right female breast: Secondary | ICD-10-CM

## 2022-03-13 DIAGNOSIS — Z5111 Encounter for antineoplastic chemotherapy: Secondary | ICD-10-CM | POA: Diagnosis not present

## 2022-03-13 DIAGNOSIS — Z95828 Presence of other vascular implants and grafts: Secondary | ICD-10-CM

## 2022-03-13 LAB — CBC WITH DIFFERENTIAL (CANCER CENTER ONLY)
Abs Immature Granulocytes: 0.08 10*3/uL — ABNORMAL HIGH (ref 0.00–0.07)
Basophils Absolute: 0 10*3/uL (ref 0.0–0.1)
Basophils Relative: 0 %
Eosinophils Absolute: 0 10*3/uL (ref 0.0–0.5)
Eosinophils Relative: 0 %
HCT: 38.5 % (ref 36.0–46.0)
Hemoglobin: 12.8 g/dL (ref 12.0–15.0)
Immature Granulocytes: 1 %
Lymphocytes Relative: 9 %
Lymphs Abs: 1.3 10*3/uL (ref 0.7–4.0)
MCH: 31.1 pg (ref 26.0–34.0)
MCHC: 33.2 g/dL (ref 30.0–36.0)
MCV: 93.4 fL (ref 80.0–100.0)
Monocytes Absolute: 0.9 10*3/uL (ref 0.1–1.0)
Monocytes Relative: 6 %
Neutro Abs: 11.5 10*3/uL — ABNORMAL HIGH (ref 1.7–7.7)
Neutrophils Relative %: 84 %
Platelet Count: 575 10*3/uL — ABNORMAL HIGH (ref 150–400)
RBC: 4.12 MIL/uL (ref 3.87–5.11)
RDW: 14.2 % (ref 11.5–15.5)
WBC Count: 13.7 10*3/uL — ABNORMAL HIGH (ref 4.0–10.5)
nRBC: 0 % (ref 0.0–0.2)

## 2022-03-13 LAB — CMP (CANCER CENTER ONLY)
ALT: 25 U/L (ref 0–44)
AST: 20 U/L (ref 15–41)
Albumin: 4.5 g/dL (ref 3.5–5.0)
Alkaline Phosphatase: 89 U/L (ref 38–126)
Anion gap: 9 (ref 5–15)
BUN: 12 mg/dL (ref 8–23)
CO2: 25 mmol/L (ref 22–32)
Calcium: 9.7 mg/dL (ref 8.9–10.3)
Chloride: 104 mmol/L (ref 98–111)
Creatinine: 0.56 mg/dL (ref 0.44–1.00)
GFR, Estimated: >60 mL/min (ref >60)
Glucose, Bld: 172 mg/dL — ABNORMAL HIGH (ref 70–99)
Potassium: 3.7 mmol/L (ref 3.5–5.1)
Sodium: 138 mmol/L (ref 135–145)
Total Bilirubin: 0.4 mg/dL (ref 0.3–1.2)
Total Protein: 7.8 g/dL (ref 6.5–8.1)

## 2022-03-13 MED ORDER — SODIUM CHLORIDE 0.9 % IV SOLN
10.0000 mg | Freq: Once | INTRAVENOUS | Status: AC
  Administered 2022-03-13: 10 mg via INTRAVENOUS
  Filled 2022-03-13: qty 10

## 2022-03-13 MED ORDER — PALONOSETRON HCL INJECTION 0.25 MG/5ML
0.2500 mg | Freq: Once | INTRAVENOUS | Status: AC
  Administered 2022-03-13: 0.25 mg via INTRAVENOUS
  Filled 2022-03-13: qty 5

## 2022-03-13 MED ORDER — SODIUM CHLORIDE 0.9% FLUSH
10.0000 mL | Freq: Once | INTRAVENOUS | Status: AC
  Administered 2022-03-13: 10 mL

## 2022-03-13 MED ORDER — SODIUM CHLORIDE 0.9 % IV SOLN: Freq: Once | INTRAVENOUS | Status: AC

## 2022-03-13 MED ORDER — SODIUM CHLORIDE 0.9 % IV SOLN
600.0000 mg/m2 | Freq: Once | INTRAVENOUS | Status: AC
  Administered 2022-03-13: 1140 mg via INTRAVENOUS
  Filled 2022-03-13: qty 57

## 2022-03-13 MED ORDER — SODIUM CHLORIDE 0.9 % IV SOLN
75.0000 mg/m2 | Freq: Once | INTRAVENOUS | Status: AC
  Administered 2022-03-13: 140 mg via INTRAVENOUS
  Filled 2022-03-13: qty 14

## 2022-03-13 MED ORDER — HEPARIN SOD (PORK) LOCK FLUSH 100 UNIT/ML IV SOLN
500.0000 [IU] | Freq: Once | INTRAVENOUS | Status: AC | PRN
  Administered 2022-03-13: 500 [IU]

## 2022-03-13 MED ORDER — SODIUM CHLORIDE 0.9% FLUSH
10.0000 mL | INTRAVENOUS | Status: DC | PRN
  Administered 2022-03-13: 10 mL

## 2022-03-13 NOTE — Patient Instructions (Signed)

## 2022-03-13 NOTE — Patient Instructions (Signed)
[facility]  Discharge Instructions: Thank you for choosing [facility] to provide your oncology and hematology care.   If you have a lab appointment with the [facility], please go directly to the [facility] and check in at the registration area.   Wear comfortable clothing and clothing appropriate for easy access to any Portacath or PICC line.   We strive to give you quality time with your provider. You may need to reschedule your appointment if you arrive late (15 or more minutes).  Arriving late affects you and other patients whose appointments are after yours.  Also, if you miss three or more appointments without notifying the office, you may be dismissed from the clinic at the provider's discretion.      For prescription refill requests, have your pharmacy contact our office and allow 72 hours for refills to be completed.    Today you received the following chemotherapy and/or immunotherapy agents: Docetaxel (Taxotere) and Cyclophosphamide (Cytoxan)   To help prevent nausea and vomiting after your treatment, we encourage you to take your nausea medication as directed.  BELOW ARE SYMPTOMS THAT SHOULD BE REPORTED IMMEDIATELY: *FEVER GREATER THAN 100.4 F (38 C) OR HIGHER *CHILLS OR SWEATING *NAUSEA AND VOMITING THAT IS NOT CONTROLLED WITH YOUR NAUSEA MEDICATION *UNUSUAL SHORTNESS OF BREATH *UNUSUAL BRUISING OR BLEEDING *URINARY PROBLEMS (pain or burning when urinating, or frequent urination) *BOWEL PROBLEMS (unusual diarrhea, constipation, pain near the anus) TENDERNESS IN MOUTH AND THROAT WITH OR WITHOUT PRESENCE OF ULCERS (sore throat, sores in mouth, or a toothache) UNUSUAL RASH, SWELLING OR PAIN  UNUSUAL VAGINAL DISCHARGE OR ITCHING   Items with * indicate a potential emergency and should be followed up as soon as possible or go to the Emergency Department if any problems should occur.  Please show the CHEMOTHERAPY ALERT CARD or  IMMUNOTHERAPY ALERT CARD at check-in to the Emergency Department and triage nurse.  Should you have questions after your visit or need to cancel or reschedule your appointment, please contact [facility]  Dept: [phone]  and follow the prompts.  Office hours are 8:00 a.m. to 4:30 p.m. Monday - Friday. Please note that voicemails left after 4:00 p.m. may not be returned until the following business day.  We are closed weekends and major holidays. You have access to a nurse at all times for urgent questions. Please call the main number to the clinic Dept: [phone] and follow the prompts.   For any non-urgent questions, you may also contact your provider using MyChart. We now offer e-Visits for anyone 64 and older to request care online for non-urgent symptoms. For details visit [email or web address].   Also download the MyChart app! Go to the app store, search "MyChart", open the app, select [facility], and log in with your MyChart username and password.  Masks are optional in the cancer centers. If you would like for your care team to wear a mask while they are taking care of you, please let them know. You may have one support person who is at least 62 years old accompany you for your appointments. Docetaxel Injection What is this medication? DOCETAXEL (doe se TAX el) treats some types of cancer. It works by slowing down the growth of cancer cells. This medicine may be used for other purposes; ask your health care provider or pharmacist if you have questions. COMMON BRAND NAME(S): Docefrez, Taxotere What should I tell my care team before I take this  medication? They need to know if you have any of these conditions: Kidney disease Liver disease Low white blood cell levels Tingling of the fingers or toes or other nerve disorder An unusual or allergic reaction to docetaxel, polysorbate 80, other medications, foods, dyes, or preservatives Pregnant or trying  to get pregnant Breast-feeding How should I use this medication? This medication is injected into a vein. It is given by your care team in a hospital or clinic setting. Talk to your care team about the use of this medication in children. Special care may be needed. Overdosage: If you think you have taken too much of this medicine contact a poison control center or emergency room at once. NOTE: This medicine is only for you. Do not share this medicine with others. What if I miss a dose? Keep appointments for follow-up doses. It is important not to miss your dose. Call your care team if you are unable to keep an appointment. What may interact with this medication? Do not take this medication with any of the following: Live virus vaccines This medication may also interact with the following: Certain antibiotics, such as clarithromycin, telithromycin Certain antivirals for HIV or hepatitis Certain medications for fungal infections, such as itraconazole, ketoconazole, voriconazole Grapefruit juice Nefazodone Supplements, such as St. John's wort This list may not describe all possible interactions. Give your health care provider a list of all the medicines, herbs, non-prescription drugs, or dietary supplements you use. Also tell them if you smoke, drink alcohol, or use illegal drugs. Some items may interact with your medicine. What should I watch for while using this medication? This medication may make you feel generally unwell. This is not uncommon as chemotherapy can affect healthy cells as well as cancer cells. Report any side effects. Continue your course of treatment even though you feel ill unless your care team tells you to stop. You may need blood work done while you are taking this medication. This medication can cause serious side effects and infusion reactions. To reduce the risk, your care team may give you other medications to take before receiving this one. Be sure to follow the  directions from your care team. This medication may increase your risk of getting an infection. Call your care team for advice if you get a fever, chills, sore throat, or other symptoms of a cold or flu. Do not treat yourself. Try to avoid being around people who are sick. Avoid taking medications that contain aspirin, acetaminophen, ibuprofen, naproxen, or ketoprofen unless instructed by your care team. These medications may hide a fever. Be careful brushing or flossing your teeth or using a toothpick because you may get an infection or bleed more easily. If you have any dental work done, tell your dentist you are receiving this medication. Some products may contain alcohol. Ask your care team if this medication contains alcohol. Be sure to tell all care teams you are taking this medicine. Certain medications, like metronidazole and disulfiram, can cause an unpleasant reaction when taken with alcohol. The reaction includes flushing, headache, nausea, vomiting, sweating, and increased thirst. The reaction can last from 30 minutes to several hours. This medication may affect your coordination, reaction time, or judgement. Do not drive or operate machinery until you know how this medication affects you. Sit up or stand slowly to reduce the risk of dizzy or fainting spells. Drinking alcohol with this medication can increase the risk of these side effects. Talk to your care team about your risk of  cancer. You may be more at risk for certain types of cancer if you take this medication. Talk to your care team if you wish to become pregnant or think you might be pregnant. This medication can cause serious birth defects if taken during pregnancy or if you get pregnant within 2 months after stopping therapy. A negative pregnancy test is required before starting this medication. A reliable form of contraception is recommended while taking this medication and for 2 months after stopping it. Talk to your care team about  reliable forms of contraception. Do not breast-feed while taking this medication and for 1 week after stopping therapy. Use a condom during sex and for 4 months after stopping therapy. Tell your care team right away if you think your partner might be pregnant. This medication can cause serious birth defects. This medication may cause infertility. Talk to your care team if you are concerned about your fertility. What side effects may I notice from receiving this medication? Side effects that you should report to your care team as soon as possible: Allergic reactions--skin rash, itching, hives, swelling of the face, lips, tongue, or throat Change in vision such as blurry vision, seeing halos around lights, vision loss Infection--fever, chills, cough, or sore throat Infusion reactions--chest pain, shortness of breath or trouble breathing, feeling faint or lightheaded Low red blood cell level--unusual weakness or fatigue, dizziness, headache, trouble breathing Pain, tingling, or numbness in the hands or feet Painful swelling, warmth, or redness of the skin, blisters or sores at the infusion site Redness, blistering, peeling, or loosening of the skin, including inside the mouth Sudden or severe stomach pain, bloody diarrhea, fever, nausea, vomiting Swelling of the ankles, hands, or feet Tumor lysis syndrome (TLS)--nausea, vomiting, diarrhea, decrease in the amount of urine, dark urine, unusual weakness or fatigue, confusion, muscle pain or cramps, fast or irregular heartbeat, joint pain Unusual bruising or bleeding Side effects that usually do not require medical attention (report to your care team if they continue or are bothersome): Change in nail shape, thickness, or color Change in taste Hair loss Increased tears This list may not describe all possible side effects. Call your doctor for medical advice about side effects. You may report side effects to FDA at 1-800-FDA-1088. Where should I keep  my medication? This medication is given in a hospital or clinic. It will not be stored at home. NOTE: This sheet is a summary. It may not cover all possible information. If you have questions about this medicine, talk to your doctor, pharmacist, or health care provider.  2023 Elsevier/Gold Standard (2007-05-22 00:00:00) Cyclophosphamide Injection What is this medication? CYCLOPHOSPHAMIDE (sye kloe FOSS fa mide) treats some types of cancer. It works by slowing down the growth of cancer cells. This medicine may be used for other purposes; ask your health care provider or pharmacist if you have questions. COMMON BRAND NAME(S): Cyclophosphamide, Cytoxan, Neosar What should I tell my care team before I take this medication? They need to know if you have any of these conditions: Heart disease Irregular heartbeat or rhythm Infection Kidney problems Liver disease Low blood cell levels (white cells, platelets, or red blood cells) Lung disease Previous radiation Trouble passing urine An unusual or allergic reaction to cyclophosphamide, other medications, foods, dyes, or preservatives Pregnant or trying to get pregnant Breast-feeding How should I use this medication? This medication is injected into a vein. It is given by your care team in a hospital or clinic setting. Talk to your care team  about the use of this medication in children. Special care may be needed. Overdosage: If you think you have taken too much of this medicine contact a poison control center or emergency room at once. NOTE: This medicine is only for you. Do not share this medicine with others. What if I miss a dose? Keep appointments for follow-up doses. It is important not to miss your dose. Call your care team if you are unable to keep an appointment. What may interact with this medication? Amphotericin B Amiodarone Azathioprine Certain antivirals for HIV or hepatitis Certain medications for blood pressure, such as  enalapril, lisinopril, quinapril Cyclosporine Diuretics Etanercept Indomethacin Medications that relax muscles Metronidazole Natalizumab Tamoxifen Warfarin This list may not describe all possible interactions. Give your health care provider a list of all the medicines, herbs, non-prescription drugs, or dietary supplements you use. Also tell them if you smoke, drink alcohol, or use illegal drugs. Some items may interact with your medicine. What should I watch for while using this medication? This medication may make you feel generally unwell. This is not uncommon as chemotherapy can affect healthy cells as well as cancer cells. Report any side effects. Continue your course of treatment even though you feel ill unless your care team tells you to stop. You may need blood work while you are taking this medication. This medication may increase your risk of getting an infection. Call your care team for advice if you get a fever, chills, sore throat, or other symptoms of a cold or flu. Do not treat yourself. Try to avoid being around people who are sick. Avoid taking medications that contain aspirin, acetaminophen, ibuprofen, naproxen, or ketoprofen unless instructed by your care team. These medications may hide a fever. Be careful brushing or flossing your teeth or using a toothpick because you may get an infection or bleed more easily. If you have any dental work done, tell your dentist you are receiving this medication. Drink water or other fluids as directed. Urinate often, even at night. Some products may contain alcohol. Ask your care team if this medication contains alcohol. Be sure to tell all care teams you are taking this medicine. Certain medicines, like metronidazole and disulfiram, can cause an unpleasant reaction when taken with alcohol. The reaction includes flushing, headache, nausea, vomiting, sweating, and increased thirst. The reaction can last from 30 minutes to several hours. Talk to  your care team if you wish to become pregnant or think you might be pregnant. This medication can cause serious birth defects if taken during pregnancy and for 1 year after the last dose. A negative pregnancy test is required before starting this medication. A reliable form of contraception is recommended while taking this medication and for 1 year after the last dose. Talk to your care team about reliable forms of contraception. Do not father a child while taking this medication and for 4 months after the last dose. Use a condom during this time period. Do not breast-feed while taking this medication or for 1 week after the last dose. This medication may cause infertility. Talk to your care team if you are concerned about your fertility. Talk to your care team about your risk of cancer. You may be more at risk for certain types of cancer if you take this medication. What side effects may I notice from receiving this medication? Side effects that you should report to your care team as soon as possible: Allergic reactions--skin rash, itching, hives, swelling of the face, lips,  tongue, or throat Dry cough, shortness of breath or trouble breathing Heart failure--shortness of breath, swelling of the ankles, feet, or hands, sudden weight gain, unusual weakness or fatigue Heart muscle inflammation--unusual weakness or fatigue, shortness of breath, chest pain, fast or irregular heartbeat, dizziness, swelling of the ankles, feet, or hands Heart rhythm changes--fast or irregular heartbeat, dizziness, feeling faint or lightheaded, chest pain, trouble breathing Infection--fever, chills, cough, sore throat, wounds that don't heal, pain or trouble when passing urine, general feeling of discomfort or being unwell Kidney injury--decrease in the amount of urine, swelling of the ankles, hands, or feet Liver injury--right upper belly pain, loss of appetite, nausea, light-colored stool, dark yellow or brown urine,  yellowing skin or eyes, unusual weakness or fatigue Low red blood cell level--unusual weakness or fatigue, dizziness, headache, trouble breathing Low sodium level--muscle weakness, fatigue, dizziness, headache, confusion Red or dark brown urine Unusual bruising or bleeding Side effects that usually do not require medical attention (report to your care team if they continue or are bothersome): Hair loss Irregular menstrual cycles or spotting Loss of appetite Nausea Pain, redness, or swelling with sores inside the mouth or throat Vomiting This list may not describe all possible side effects. Call your doctor for medical advice about side effects. You may report side effects to FDA at 1-800-FDA-1088. Where should I keep my medication? This medication is given in a hospital or clinic. It will not be stored at home. NOTE: This sheet is a summary. It may not cover all possible information. If you have questions about this medicine, talk to your doctor, pharmacist, or health care provider.  2023 Elsevier/Gold Standard (2021-05-21 00:00:00)

## 2022-03-14 ENCOUNTER — Telehealth: Payer: Self-pay | Admitting: Hematology

## 2022-03-14 NOTE — Telephone Encounter (Signed)
Called patient to notify of upcoming appointments. Left voicemail with appointment information.

## 2022-03-15 ENCOUNTER — Other Ambulatory Visit: Payer: Self-pay

## 2022-03-17 ENCOUNTER — Encounter: Payer: Self-pay | Admitting: Hematology

## 2022-03-17 ENCOUNTER — Other Ambulatory Visit: Payer: Self-pay

## 2022-03-18 ENCOUNTER — Other Ambulatory Visit: Payer: Self-pay

## 2022-03-18 ENCOUNTER — Encounter: Payer: Self-pay | Admitting: Hematology

## 2022-03-18 ENCOUNTER — Other Ambulatory Visit (HOSPITAL_COMMUNITY): Payer: Self-pay

## 2022-03-18 MED ORDER — NYSTATIN 100000 UNIT/ML MT SUSP
5.0000 mL | Freq: Three times a day (TID) | OROMUCOSAL | 0 refills | Status: DC | PRN
  Filled 2022-03-18: qty 140, 10d supply, fill #0

## 2022-03-18 MED ORDER — NYSTATIN 100000 UNIT/ML MT SUSP
5.0000 mL | Freq: Four times a day (QID) | OROMUCOSAL | 0 refills | Status: DC
  Filled 2022-03-18: qty 60, 3d supply, fill #0

## 2022-03-20 ENCOUNTER — Encounter: Payer: Self-pay | Admitting: *Deleted

## 2022-03-20 ENCOUNTER — Encounter: Payer: Self-pay | Admitting: Hematology

## 2022-03-21 ENCOUNTER — Inpatient Hospital Stay: Payer: Managed Care, Other (non HMO) | Attending: Hematology | Admitting: Physician Assistant

## 2022-03-21 VITALS — BP 122/78 | HR 86 | Temp 98.3°F | Resp 15 | Wt 168.3 lb

## 2022-03-21 DIAGNOSIS — R21 Rash and other nonspecific skin eruption: Secondary | ICD-10-CM | POA: Diagnosis not present

## 2022-03-21 DIAGNOSIS — R200 Anesthesia of skin: Secondary | ICD-10-CM | POA: Insufficient documentation

## 2022-03-21 DIAGNOSIS — R509 Fever, unspecified: Secondary | ICD-10-CM | POA: Diagnosis not present

## 2022-03-21 DIAGNOSIS — Z171 Estrogen receptor negative status [ER-]: Secondary | ICD-10-CM | POA: Insufficient documentation

## 2022-03-21 DIAGNOSIS — Z95828 Presence of other vascular implants and grafts: Secondary | ICD-10-CM | POA: Insufficient documentation

## 2022-03-21 DIAGNOSIS — Z5111 Encounter for antineoplastic chemotherapy: Secondary | ICD-10-CM | POA: Diagnosis not present

## 2022-03-21 DIAGNOSIS — K1230 Oral mucositis (ulcerative), unspecified: Secondary | ICD-10-CM | POA: Insufficient documentation

## 2022-03-21 DIAGNOSIS — Z79899 Other long term (current) drug therapy: Secondary | ICD-10-CM | POA: Insufficient documentation

## 2022-03-21 DIAGNOSIS — C50411 Malignant neoplasm of upper-outer quadrant of right female breast: Secondary | ICD-10-CM | POA: Insufficient documentation

## 2022-03-21 DIAGNOSIS — Z1152 Encounter for screening for COVID-19: Secondary | ICD-10-CM | POA: Diagnosis not present

## 2022-03-21 DIAGNOSIS — R5081 Fever presenting with conditions classified elsewhere: Secondary | ICD-10-CM | POA: Diagnosis not present

## 2022-03-21 DIAGNOSIS — D709 Neutropenia, unspecified: Secondary | ICD-10-CM | POA: Diagnosis not present

## 2022-03-21 MED ORDER — HYDROCORTISONE 1 % EX LOTN: 1.0000 | TOPICAL_LOTION | Freq: Two times a day (BID) | CUTANEOUS | 0 refills | Status: DC

## 2022-03-21 NOTE — Progress Notes (Signed)
Symptom Management Consult note [place]    Patient Care Team: Crist Infante, MD as PCP - General (Internal Medicine) Samantha Kaufmann, RN as Oncology Nurse Navigator Samantha Germany, RN as Oncology Nurse Navigator Samantha Klein, MD as Consulting Physician (General Surgery) Truitt Merle, MD as Consulting Physician (Hematology) Samantha Rudd, MD as Consulting Physician (Radiation Oncology) Samantha Corning, MD as Consulting Physician (Endocrinology) Samantha Libel, MD as Consulting Physician ([place]) Samantha Pearson, MD as Consulting Physician (Obstetrics and Gynecology)    Name of the patient: Samantha [name]  [id number]  [birth year]   Date of visit: 03/21/2022   Chief Complaint/Reason for visit: rash   Current Therapy: cytoxan and taxotere  Last treatment:  Day 1   Cycle 2 on 03/13/22   ASSESSMENT & PLAN: Patient is a 61 y.o. female  with oncologic history of triple negative right breast cancer followed by Dr. Burr Medico.  I have viewed most recent oncology note and lab work.    #) Triple negative right breast cancer - Next appointment with oncologist is 04/03/22 prior to cycle 3.   #) Rash - Exam consistent with dermatologic adverse reaction of taxane chemotherapy. Exam today does not show signs of secondary infection, antibiotics not needed. No symptoms of anaphylaxis. -Discussed patient with Dr. Burr Medico who agrees with plan to treat with topical steroid as rash is minor and not overly bothersome to patient.  Patient can take Benadryl for itching if it worsens. Dr. Burr Medico will also add premedications to further treatments. Patient knows to return to clinic if rash worsens. - Also strict ED precautions were discussed should symptoms worsen.   Heme/Onc History: Oncology History  Malignant neoplasm of upper-outer quadrant of right breast in female, estrogen receptor negative ([place])  12/06/2021 Mammogram   CLINICAL DATA:  Patient returns today to evaluate RIGHT  breast calcifications identified on a recent screening mammogram.   EXAM: DIGITAL DIAGNOSTIC UNILATERAL RIGHT MAMMOGRAM  IMPRESSION: Grouped coarse heterogeneous calcifications within the upper-outer quadrant of the RIGHT breast, measuring 6 mm extent. These may be fibroadenomatous calcifications. Stereotactic biopsy is recommended to exclude malignancy.   12/18/2021 Initial Biopsy   Diagnosis Breast, right, needle core biopsy, upper outer quadrant, x clip - DUCTAL CARCINOMA IN SITU, SOLID AND CRIBRIFORM TYPE WITH COMEDONECROSIS AND ASSOCIATED CALCIFICATIONS, NUCLEAR GRADE 3 OF 3 - FOCAL MICROINVASION IS PRESENT (LESS THAN 1 MM) WITH EVIDENCE OF LYMPHOVASCULAR INVASION - NECROSIS: PRESENT - CALCIFICATIONS: PRESENT - DCIS LENGTH: 7 MM IN GREATEST LINEAR DIMENSION ON FRAGMENTED CORES  PROGNOSTIC INDICATORS Results: IMMUNOHISTOCHEMICAL AND MORPHOMETRIC ANALYSIS PERFORMED MANUALLY The tumor cells are NEGATIVE for Her2 (1+). Estrogen Receptor: 0%, NEGATIVE Progesterone Receptor: 0%, NEGATIVE Proliferation Marker Ki67: 60%   12/23/2021 Initial Diagnosis   Malignant neoplasm of upper-outer quadrant of right breast in female, estrogen receptor negative ([place])   12/23/2021 Imaging   EXAM: ULTRASOUND OF THE RIGHT AXILLA  IMPRESSION: No abnormal appearing RIGHT axillary lymph nodes.   01/08/2022 Cancer Staging   Staging form: Breast, AJCC 8th Edition - Pathologic stage from 01/08/2022: Stage IB (pT1b, pN0, cM0, G3, ER-, PR-, HER2-) - Signed by Truitt Merle, MD on 01/24/2022 Stage prefix: Initial diagnosis Histologic grading system: 3 grade system Residual tumor (R): R0 - None   02/20/2022 -  Chemotherapy   Patient is on Treatment Plan : BREAST TC q21d         Interval history-: Samantha [name] is a 61 y.o. female with oncologic history as above [facility]  today with chief complaint of rash x 5 days.  Patient presents unaccompanied to clinic today.  Patient reports rash started on  her cheeks first.  It then spread to her chest and arms.  She tried taking Benadryl the first evening without symptom improvement. She admits one area on her right arm itches and denies any itching to her face or chest. She denies history of similar rash. She denies rash after her first treatment. No one in the house has similar rash. No new lotions, soaps, detergents, clothes, medications (besides chemo)  ROS  All other systems are reviewed and are negative for acute change except as noted in the HPI.    Allergies  Allergen Reactions   Nortriptyline Other (See Comments)    depression   Singulair [Montelukast Sodium] Other (See Comments)    Depressed mood   Sulfonamide Derivatives Nausea And Vomiting   Amitriptyline Hcl     Depression mood   Esomeprazole Hives   Gabapentin     Hair Loss   Lexapro [Escitalopram]     Depression mood   Neosporin [Bacitracin-Polymyxin B] Rash     Past Medical History:  Diagnosis Date   Allergy    Anxiety 2015   Result of culmination of super stressful caregiving and deaths of 2 immediate family members.  Overall do pretty well.   Cancer ([place]) 2023   right breast   Depression    GERD (gastroesophageal reflux disease)    Hypothyroidism    Right carpal tunnel syndrome 09/19/2019   Thyroid disease      Past Surgical History:  Procedure Laterality Date   BREAST LUMPECTOMY WITH RADIOACTIVE SEED LOCALIZATION Right 01/08/2022   Procedure: RIGHT BREAST LUMPECTOMY WITH RADIOACTIVE SEED LOCALIZATION;  Surgeon: Samantha Klein, MD;  Location: [facility];  Service: General;  Laterality: Right;   PORTACATH PLACEMENT Left 01/28/2022   Procedure: INSERTION PORT-A-CATH;  Surgeon: Samantha Klein, MD;  Location: [place];  Service: General;  Laterality: Left;   SENTINEL NODE BIOPSY Right 01/28/2022   Procedure: RIGHT SENTINEL LYMPH NODE BIOPSY;  Surgeon: Samantha Klein, MD;  Location: [place];  Service: General;  Laterality: Right;    TONSILLECTOMY      Social History   Socioeconomic History   Marital status: Married    Spouse name: Not on file   Number of children: 1   Years of education: Not on file   Highest education level: Not on file  Occupational History   Not on file  Tobacco Use   Smoking status: Never   Smokeless tobacco: Never  Vaping Use   Vaping Use: Never used  Substance and Sexual Activity   Alcohol use: Yes    Comment: maybe 1 drink 4 times a year   Drug use: No   Sexual activity: Not Currently    Birth control/protection: Post-menopausal  Other Topics Concern   Not on file  Social History Narrative   Not on file   Social Determinants of Health   Financial Resource Strain: Not on file  Food Insecurity: Not on file  Transportation Needs: Not on file  Physical Activity: Not on file  Stress: Not on file  Social Connections: Not on file  Intimate Partner Violence: Not on file    Family History  Problem Relation Age of Onset   Hyperlipidemia Father    Diabetes Maternal Grandmother      Current Outpatient Medications:    hydrocortisone 1 % lotion, Apply 1 Application topically 2 (two) times  daily., Disp: 118 mL, Rfl: 0   acetaminophen (TYLENOL) 500 MG tablet, Take 1,000 mg by mouth every 6 (six) hours as needed for moderate pain., Disp: , Rfl:    Calcium Carb-Cholecalciferol (CALCIUM-VITAMIN D3) 250-125 MG-UNIT TABS, Take 1 tablet once a day, Disp: , Rfl:    Cholecalciferol (VITAMIN D3) 1000 units CAPS, Take 1,000 Units by mouth daily., Disp: , Rfl:    dexamethasone (DECADRON) 4 MG tablet, Take 2 tabs by mouth 2 times daily starting day before chemo. Then take 2 tabs daily for 2 days starting day after chemo. Take with food. (Patient not taking: Reported on 01/28/2022), Disp: 30 tablet, Rfl: 1   EPINEPHrine 0.3 mg/0.3 mL IJ SOAJ injection, Inject 0.3 mg into the muscle as needed for anaphylaxis., Disp: 2 each, Rfl: 1   famotidine (PEPCID) 20 MG tablet, Take 1 tablet (20 mg total) by  mouth daily., Disp: , Rfl:    lidocaine-prilocaine (EMLA) cream, Apply to affected area once (Patient not taking: Reported on 01/28/2022), Disp: 30 g, Rfl: 3   LORazepam (ATIVAN) 0.5 MG tablet, Take 0.25-0.5 mg by mouth 2 (two) times daily as needed for anxiety., Disp: , Rfl:    magic mouthwash (nystatin, lidocaine, diphenhydrAMINE, alum & mag hydroxide) suspension, Swish and swallow 5 mLs by mouth 3 (three) times daily as needed for mouth pain. ( Use 1 minute prior to eating as needed for mouth pain.), Disp: 140 mL, Rfl: 0   nystatin (MYCOSTATIN) 100000 UNIT/ML suspension, Swish and swallow 5 mLs in the mouth or throat 4 times daily, Disp: 60 mL, Rfl: 0   ondansetron (ZOFRAN) 8 MG tablet, Take 1 tablet (8 mg total) by mouth every 8 (eight) hours as needed for nausea or vomiting. Start on the third day after chemotherapy. (Patient not taking: Reported on 01/28/2022), Disp: 30 tablet, Rfl: 1   oxyCODONE (OXY IR/ROXICODONE) 5 MG immediate release tablet, Take 1 tablet (5 mg total) by mouth every 6 (six) hours as needed for severe pain. (Patient not taking: Reported on 01/28/2022), Disp: 5 tablet, Rfl: 0   prochlorperazine (COMPAZINE) 10 MG tablet, Take 1 tablet (10 mg total) by mouth every 6 (six) hours as needed for nausea or vomiting. (Patient not taking: Reported on 01/28/2022), Disp: 30 tablet, Rfl: 1   sertraline (ZOLOFT) 50 MG tablet, Take 50 mg by mouth daily., Disp: , Rfl:    TIROSINT 100 MCG CAPS, Take 100 mcg by mouth every Sunday., Disp: , Rfl:    TIROSINT 88 MCG CAPS, Take 88 mcg by mouth See admin instructions. Take 88 mcg by mouth daily Monday-Saturday, Disp: , Rfl: 10  PHYSICAL EXAM: ECOG FS:1 - Symptomatic but completely ambulatory    Vitals:   03/21/22 1343  BP: 122/78  Pulse: 86  Resp: 15  Temp: 98.3 F (36.8 C)  TempSrc: Oral  SpO2: 99%  Weight: 168 lb 4.8 oz (76.3 kg)   Physical Exam Vitals and nursing note reviewed.  Constitutional:      Appearance: She is  well-developed. She is not ill-appearing or toxic-appearing.  HENT:     Head: Normocephalic.     Nose: Nose normal.  Eyes:     Conjunctiva/sclera: Conjunctivae normal.  Neck:     Vascular: No JVD.  Cardiovascular:     Rate and Rhythm: Normal rate and regular rhythm.     Pulses: Normal pulses.     Heart sounds: Normal heart sounds.  Pulmonary:     Effort: Pulmonary effort is normal.  Breath sounds: Normal breath sounds.  Abdominal:     General: There is no distension.  Musculoskeletal:     Cervical back: Normal range of motion.  Skin:    General: Skin is warm and dry.     Findings: Rash present. Rash is macular.     Comments: Rash on bilateral cheeks and neck with few spots on arms  Neurological:     Mental Status: She is oriented to person, place, and time.             LABORATORY DATA: I have reviewed the data as listed    Latest Ref Rng & Units 03/13/2022    9:39 AM 02/27/2022    8:26 AM 02/20/2022    9:40 AM  CBC  WBC 4.0 - 10.5 K/uL 13.7  19.5  15.0   Hemoglobin 12.0 - 15.0 g/dL 12.8  13.6  13.6   Hematocrit 36.0 - 46.0 % 38.5  41.1  41.2   Platelets 150 - 400 K/uL 575  161  298         Latest Ref Rng & Units 03/13/2022    9:39 AM 02/27/2022    8:26 AM 02/20/2022    9:40 AM  CMP  Glucose 70 - 99 mg/dL 172  138  172   BUN 8 - 23 mg/dL _0 Creatinine 0.44 - 1.00 mg/dL 0.56  0.70  0.74   Sodium 135 - 145 mmol/L 138  138  140   Potassium 3.5 - 5.1 mmol/L 3.7  3.9  3.5   Chloride 98 - 111 mmol/L 104  101  105   CO2 22 - 32 mmol/L _1 Calcium 8.9 - 10.3 mg/dL 9.7  9.7  9.5   Total Protein 6.5 - 8.1 g/dL 7.8  7.4  7.6   Total Bilirubin 0.3 - 1.2 mg/dL 0.4  0.5  0.4   Alkaline Phos 38 - 126 U/L 89  141  98   AST 15 - 41 U/L _2 ALT 0 - 44 U/L 25  35  31        RADIOGRAPHIC STUDIES (from last 24 hours if applicable) I have personally reviewed the radiological images as listed and agreed with the findings in the  report. No results found.      Visit Diagnosis: 1. Malignant neoplasm of upper-outer quadrant of right breast in female, estrogen receptor negative ([place])   2. Rash      No orders of the defined types were placed in this encounter.   All questions were answered. The patient knows to call the clinic with any problems, questions or concerns. No barriers to learning was detected.  I have spent a total of 30 minutes minutes of face-to-face and non-face-to-face time, preparing to see the patient, obtaining and/or reviewing separately obtained history, performing a medically appropriate examination, counseling and educating the patient, ordering medications, documenting clinical information in the electronic health record, and care coordination (communications with other health care professionals or caregivers).    Thank you for allowing me to participate in the care of this patient.    Barrie Folk, PA-C Department of Hematology/Oncology [facility] at [facility] Phone: [phone]  [phone]    03/21/2022 3:32 PM

## 2022-03-24 ENCOUNTER — Ambulatory Visit (HOSPITAL_COMMUNITY)
Admission: RE | Admit: 2022-03-24 | Discharge: 2022-03-24 | Disposition: A | Discharge status: Home / Self Care | Payer: Managed Care, Other (non HMO) | Source: Ambulatory Visit | Attending: Physician Assistant | Admitting: Physician Assistant

## 2022-03-24 ENCOUNTER — Inpatient Hospital Stay: Payer: Managed Care, Other (non HMO)

## 2022-03-24 ENCOUNTER — Other Ambulatory Visit: Payer: Self-pay

## 2022-03-24 ENCOUNTER — Encounter: Payer: Self-pay | Admitting: Physician Assistant

## 2022-03-24 ENCOUNTER — Telehealth: Payer: Self-pay

## 2022-03-24 ENCOUNTER — Inpatient Hospital Stay (HOSPITAL_BASED_OUTPATIENT_CLINIC_OR_DEPARTMENT_OTHER): Payer: Managed Care, Other (non HMO) | Admitting: Physician Assistant

## 2022-03-24 ENCOUNTER — Other Ambulatory Visit: Payer: Self-pay | Admitting: Physician Assistant

## 2022-03-24 VITALS — BP 132/73 | HR 91 | Temp 98.4°F | Resp 18 | Wt 168.6 lb

## 2022-03-24 VITALS — BP 120/71 | HR 98 | Temp 98.6°F | Resp 16

## 2022-03-24 DIAGNOSIS — Z171 Estrogen receptor negative status [ER-]: Secondary | ICD-10-CM

## 2022-03-24 DIAGNOSIS — D709 Neutropenia, unspecified: Secondary | ICD-10-CM

## 2022-03-24 DIAGNOSIS — Z95828 Presence of other vascular implants and grafts: Secondary | ICD-10-CM

## 2022-03-24 DIAGNOSIS — R5081 Fever presenting with conditions classified elsewhere: Secondary | ICD-10-CM

## 2022-03-24 DIAGNOSIS — C50411 Malignant neoplasm of upper-outer quadrant of right female breast: Secondary | ICD-10-CM | POA: Diagnosis not present

## 2022-03-24 DIAGNOSIS — Z5111 Encounter for antineoplastic chemotherapy: Secondary | ICD-10-CM | POA: Diagnosis not present

## 2022-03-24 DIAGNOSIS — R509 Fever, unspecified: Secondary | ICD-10-CM

## 2022-03-24 DIAGNOSIS — R21 Rash and other nonspecific skin eruption: Secondary | ICD-10-CM | POA: Diagnosis not present

## 2022-03-24 LAB — URINALYSIS, COMPLETE (UACMP) WITH MICROSCOPIC
Bacteria, UA: NONE SEEN
Bilirubin Urine: NEGATIVE
Glucose, UA: NEGATIVE mg/dL
Hgb urine dipstick: NEGATIVE
Ketones, ur: NEGATIVE mg/dL
Leukocytes,Ua: NEGATIVE
Nitrite: NEGATIVE
Protein, ur: NEGATIVE mg/dL
Specific Gravity, Urine: 1.003 — ABNORMAL LOW (ref 1.005–1.030)
pH: 6 (ref 5.0–8.0)

## 2022-03-24 LAB — CBC WITH DIFFERENTIAL (CANCER CENTER ONLY)
Abs Immature Granulocytes: 0.01 10*3/uL (ref 0.00–0.07)
Basophils Absolute: 0 10*3/uL (ref 0.0–0.1)
Basophils Relative: 2 %
Eosinophils Absolute: 0 10*3/uL (ref 0.0–0.5)
Eosinophils Relative: 0 %
HCT: 35.6 % — ABNORMAL LOW (ref 36.0–46.0)
Hemoglobin: 11.8 g/dL — ABNORMAL LOW (ref 12.0–15.0)
Immature Granulocytes: 1 %
Lymphocytes Relative: 55 %
Lymphs Abs: 1 10*3/uL (ref 0.7–4.0)
MCH: 31.4 pg (ref 26.0–34.0)
MCHC: 33.1 g/dL (ref 30.0–36.0)
MCV: 94.7 fL (ref 80.0–100.0)
Monocytes Absolute: 0.7 10*3/uL (ref 0.1–1.0)
Monocytes Relative: 39 %
Neutro Abs: 0.1 10*3/uL — CL (ref 1.7–7.7)
Neutrophils Relative %: 3 %
Platelet Count: 267 10*3/uL (ref 150–400)
RBC: 3.76 MIL/uL — ABNORMAL LOW (ref 3.87–5.11)
RDW: 13.6 % (ref 11.5–15.5)
WBC Count: 1.7 10*3/uL — ABNORMAL LOW (ref 4.0–10.5)
nRBC: 0 % (ref 0.0–0.2)

## 2022-03-24 LAB — CMP (CANCER CENTER ONLY)
ALT: 22 U/L (ref 0–44)
AST: 19 U/L (ref 15–41)
Albumin: 4.1 g/dL (ref 3.5–5.0)
Alkaline Phosphatase: 80 U/L (ref 38–126)
Anion gap: 8 (ref 5–15)
BUN: 7 mg/dL — ABNORMAL LOW (ref 8–23)
CO2: 27 mmol/L (ref 22–32)
Calcium: 9.4 mg/dL (ref 8.9–10.3)
Chloride: 103 mmol/L (ref 98–111)
Creatinine: 0.6 mg/dL (ref 0.44–1.00)
GFR, Estimated: >60 mL/min (ref >60)
Glucose, Bld: 132 mg/dL — ABNORMAL HIGH (ref 70–99)
Potassium: 3.6 mmol/L (ref 3.5–5.1)
Sodium: 138 mmol/L (ref 135–145)
Total Bilirubin: 0.4 mg/dL (ref 0.3–1.2)
Total Protein: 7 g/dL (ref 6.5–8.1)

## 2022-03-24 LAB — RESP PANEL BY RT-PCR (RSV, FLU A&B, COVID)  RVPGX2
Influenza A by PCR: NEGATIVE
Influenza B by PCR: NEGATIVE
Resp Syncytial Virus by PCR: NEGATIVE
SARS Coronavirus 2 by RT PCR: NEGATIVE

## 2022-03-24 MED ORDER — LEVOFLOXACIN IN D5W 750 MG/150ML IV SOLN
750.0000 mg | Freq: Once | INTRAVENOUS | Status: AC
  Administered 2022-03-24: 750 mg via INTRAVENOUS
  Filled 2022-03-24: qty 150

## 2022-03-24 MED ORDER — SODIUM CHLORIDE 0.9% FLUSH
10.0000 mL | Freq: Once | INTRAVENOUS | Status: AC
  Administered 2022-03-24: 10 mL

## 2022-03-24 MED ORDER — LEVOFLOXACIN IN D5W 500 MG/100ML IV SOLN: 500.0000 mg | INTRAVENOUS | Status: DC

## 2022-03-24 MED ORDER — SODIUM CHLORIDE 0.9 % IV SOLN: Freq: Once | INTRAVENOUS | Status: AC

## 2022-03-24 MED ORDER — ALTEPLASE 2 MG IJ SOLR
2.0000 mg | Freq: Once | INTRAMUSCULAR | Status: AC
  Administered 2022-03-24: 2 mg
  Filled 2022-03-24: qty 2

## 2022-03-24 MED ORDER — AMOXICILLIN-POT CLAVULANATE 875-125 MG PO TABS: 1.0000 | ORAL_TABLET | Freq: Two times a day (BID) | ORAL | 0 refills | Status: AC

## 2022-03-24 MED ORDER — HEPARIN SOD (PORK) LOCK FLUSH 100 UNIT/ML IV SOLN
500.0000 [IU] | Freq: Once | INTRAVENOUS | Status: AC
  Administered 2022-03-24: 500 [IU]

## 2022-03-24 NOTE — Telephone Encounter (Signed)
RN returned patient's voicemail reporting fever of 100.4 and tenderness at port site. Appointments made for patient to have chest xray, labs and [facility] appointment. Patient verbalized understanding that due to having a fever, she needs to have chest xray, blood cultures, urine cultures and basic labs. Patient stated she can arrive to [facility] at 1145 for xray. Appointments made and lab orders entered.

## 2022-03-24 NOTE — Progress Notes (Signed)
Symptom Management Consult note [place]    Patient Care Team: Crist Infante, MD as PCP - General (Internal Medicine) Mauro Kaufmann, RN as Oncology Nurse Navigator [name], RN as Oncology Nurse Navigator Stark Klein, MD as Consulting Physician (General Surgery) Truitt Merle, MD as Consulting Physician (Hematology) Kyung Rudd, MD as Consulting Physician (Radiation Oncology) Marcene Corning, MD as Consulting Physician (Endocrinology) Stefanie Libel, MD as Consulting Physician ([place]) Marylynn Pearson, MD as Consulting Physician (Obstetrics and Gynecology)    Name of the patient: Samantha [name]  [id number]  09-20-1960   Date of visit: 03/24/2022   Chief Complaint/Reason for visit: fever   Current Therapy: Cytoxan and Taxotere  Last treatment:  Day 1   Cycle 2 on 03/13/22   ASSESSMENT & PLAN: Patient is a 61 y.o. female  with oncologic history of triple negative right breast cancer followed by Dr. Burr Medico.  I have viewed most recent oncology note and lab work.    #) Triple negative right breast cancer - Next appointment with oncologist is 04/03/22.  #) Neutropenic fever -Patient had severe pain with Neulasta onpro after cycle 1 so it was discontinued. -Patient is afebrile in clinic, she is overall nontoxic-appearing. -CBC today shows WBC 1.7 with ANC of 0.1. -Chest xray viewed by me is without acute infectious process.  I agree with radiologist impression.  Image also shows proper port placement. -UA collected and is without signs of infection.  Urine culture collected as well. -Blood cultures collected and are in process. -After discussion with infectious disease pharmacist plan is for IV Levaquin x 2 days and p.o. Augmentin.  If cultures are negative she can transition to p.o. Levaquin.  Patient is agreeable with this plan and wants to avoid hospital admission as long as appropriate. -Patient also seen by Dr. Burr Medico who is agreeable with this  plan. - Very strict ED precautions discussed.  #)Rash -Seen for suspected mild taxan rash 03/21/22 and precribed hydrocortisone ointment. -Rash is improving  Heme/Onc History: Oncology History  Malignant neoplasm of upper-outer quadrant of right breast in female, estrogen receptor negative ([place])  12/06/2021 Mammogram   CLINICAL DATA:  Patient returns today to evaluate RIGHT breast calcifications identified on a recent screening mammogram.   EXAM: DIGITAL DIAGNOSTIC UNILATERAL RIGHT MAMMOGRAM  IMPRESSION: Grouped coarse heterogeneous calcifications within the upper-outer quadrant of the RIGHT breast, measuring 6 mm extent. These may be fibroadenomatous calcifications. Stereotactic biopsy is recommended to exclude malignancy.   12/18/2021 Initial Biopsy   Diagnosis Breast, right, needle core biopsy, upper outer quadrant, x clip - DUCTAL CARCINOMA IN SITU, SOLID AND CRIBRIFORM TYPE WITH COMEDONECROSIS AND ASSOCIATED CALCIFICATIONS, NUCLEAR GRADE 3 OF 3 - FOCAL MICROINVASION IS PRESENT (LESS THAN 1 MM) WITH EVIDENCE OF LYMPHOVASCULAR INVASION - NECROSIS: PRESENT - CALCIFICATIONS: PRESENT - DCIS LENGTH: 7 MM IN GREATEST LINEAR DIMENSION ON FRAGMENTED CORES  PROGNOSTIC INDICATORS Results: IMMUNOHISTOCHEMICAL AND MORPHOMETRIC ANALYSIS PERFORMED MANUALLY The tumor cells are NEGATIVE for Her2 (1+). Estrogen Receptor: 0%, NEGATIVE Progesterone Receptor: 0%, NEGATIVE Proliferation Marker Ki67: 60%   12/23/2021 Initial Diagnosis   Malignant neoplasm of upper-outer quadrant of right breast in female, estrogen receptor negative ([place])   12/23/2021 Imaging   EXAM: ULTRASOUND OF THE RIGHT AXILLA  IMPRESSION: No abnormal appearing RIGHT axillary lymph nodes.   01/08/2022 Cancer Staging   Staging form: Breast, AJCC 8th Edition - Pathologic stage from 01/08/2022: Stage IB (pT1b, pN0, cM0, G3, ER-, PR-, HER2-) - Signed by Truitt Merle,  MD on 01/24/2022 Stage prefix: Initial diagnosis Histologic  grading system: 3 grade system Residual tumor (R): R0 - None   02/20/2022 -  Chemotherapy   Patient is on Treatment Plan : BREAST TC q21d         Interval history-: Samantha [name] is a 62 y.o. female with oncologic history as above presenting to [facility] today with chief complaint of fever x 1 day.  Patient presents unaccompanied to clinic today.  Patient reports Tmax of 100.4 at 1030 this morning.  She has not taken any over-the-counter medication prior to arrival.  Patient states over the weekend she went to her parents house and was around their wood-burning stove.  The next day she started to have right nasal congestion and watery eyes.  She states this sometimes happens after being around the wood-burning stove.  She denies any sick contacts.  She also states that over the weekend she did a lot of yard work with raking and blowing leaves as well as lifting boxes of Christmas decorations.  She had some aching localized to her port site after doing all the yard work.  Pain is intermittent.  She did not have any fall or injury.  She rates the pain currently 2 out of 10 in severity.  Pain is worse with movement.  She has not seen any redness or drainage from port.  Patient denies chills, cough, shortness of breath, abdominal pain, urinary symptoms, diarrhea.  Patient is currently using the hydrocortisone ointment and thinks her rash from last clinic visit on 03/21/2022 is improving.  Denies any itching.      ROS  All other systems are reviewed and are negative for acute change except as noted in the HPI.    Allergies  Allergen Reactions   Nortriptyline Other (See Comments)    depression   Singulair [Montelukast Sodium] Other (See Comments)    Depressed mood   Sulfonamide Derivatives Nausea And Vomiting   Amitriptyline Hcl     Depression mood   Esomeprazole Hives   Gabapentin     Hair Loss   Lexapro [Escitalopram]     Depression mood   Neosporin [Bacitracin-Polymyxin B] Rash      Past Medical History:  Diagnosis Date   Allergy    Anxiety 2015   Result of culmination of super stressful caregiving and deaths of 2 immediate family members.  Overall do pretty well.   Cancer [facility]) 2023   right breast   Depression    GERD (gastroesophageal reflux disease)    Hypothyroidism    Right carpal tunnel syndrome 09/19/2019   Thyroid disease      Past Surgical History:  Procedure Laterality Date   BREAST LUMPECTOMY WITH RADIOACTIVE SEED LOCALIZATION Right 01/08/2022   Procedure: RIGHT BREAST LUMPECTOMY WITH RADIOACTIVE SEED LOCALIZATION;  Surgeon: Stark Klein, MD;  Location: [place];  Service: General;  Laterality: Right;   PORTACATH PLACEMENT Left 01/28/2022   Procedure: INSERTION PORT-A-CATH;  Surgeon: Stark Klein, MD;  Location: [place];  Service: General;  Laterality: Left;   SENTINEL NODE BIOPSY Right 01/28/2022   Procedure: RIGHT SENTINEL LYMPH NODE BIOPSY;  Surgeon: Stark Klein, MD;  Location: [place];  Service: General;  Laterality: Right;   TONSILLECTOMY      Social History   Socioeconomic History   Marital status: Married    Spouse name: Not on file   Number of children: 1   Years of education: Not on file   Highest education  level: Not on file  Occupational History   Not on file  Tobacco Use   Smoking status: Never   Smokeless tobacco: Never  Vaping Use   Vaping Use: Never used  Substance and Sexual Activity   Alcohol use: Yes    Comment: maybe 1 drink 4 times a year   Drug use: No   Sexual activity: Not Currently    Birth control/protection: Post-menopausal  Other Topics Concern   Not on file  Social History Narrative   Not on file   Social Determinants of Health   Financial Resource Strain: Not on file  Food Insecurity: Not on file  Transportation Needs: Not on file  Physical Activity: Not on file  Stress: Not on file  Social Connections: Not on file  Intimate Partner Violence: Not on  file    Family History  Problem Relation Age of Onset   Hyperlipidemia Father    Diabetes Maternal Grandmother      Current Outpatient Medications:    amoxicillin-clavulanate (AUGMENTIN) 875-125 MG tablet, Take 1 tablet by mouth 2 (two) times daily for 10 days., Disp: 20 tablet, Rfl: 0   acetaminophen (TYLENOL) 500 MG tablet, Take 1,000 mg by mouth every 6 (six) hours as needed for moderate pain., Disp: , Rfl:    Calcium Carb-Cholecalciferol (CALCIUM-VITAMIN D3) 250-125 MG-UNIT TABS, Take 1 tablet once a day, Disp: , Rfl:    Cholecalciferol (VITAMIN D3) 1000 units CAPS, Take 1,000 Units by mouth daily., Disp: , Rfl:    dexamethasone (DECADRON) 4 MG tablet, Take 2 tabs by mouth 2 times daily starting day before chemo. Then take 2 tabs daily for 2 days starting day after chemo. Take with food. (Patient not taking: Reported on 01/28/2022), Disp: 30 tablet, Rfl: 1   EPINEPHrine 0.3 mg/0.3 mL IJ SOAJ injection, Inject 0.3 mg into the muscle as needed for anaphylaxis., Disp: 2 each, Rfl: 1   famotidine (PEPCID) 20 MG tablet, Take 1 tablet (20 mg total) by mouth daily., Disp: , Rfl:    hydrocortisone 1 % lotion, Apply 1 Application topically 2 (two) times daily., Disp: 118 mL, Rfl: 0   lidocaine-prilocaine (EMLA) cream, Apply to affected area once (Patient not taking: Reported on 01/28/2022), Disp: 30 g, Rfl: 3   LORazepam (ATIVAN) 0.5 MG tablet, Take 0.25-0.5 mg by mouth 2 (two) times daily as needed for anxiety., Disp: , Rfl:    magic mouthwash (nystatin, lidocaine, diphenhydrAMINE, alum & mag hydroxide) suspension, Swish and swallow 5 mLs by mouth 3 (three) times daily as needed for mouth pain. ( Use 1 minute prior to eating as needed for mouth pain.), Disp: 140 mL, Rfl: 0   nystatin (MYCOSTATIN) 100000 UNIT/ML suspension, Swish and swallow 5 mLs in the mouth or throat 4 times daily, Disp: 60 mL, Rfl: 0   ondansetron (ZOFRAN) 8 MG tablet, Take 1 tablet (8 mg total) by mouth every 8 (eight) hours  as needed for nausea or vomiting. Start on the third day after chemotherapy. (Patient not taking: Reported on 01/28/2022), Disp: 30 tablet, Rfl: 1   oxyCODONE (OXY IR/ROXICODONE) 5 MG immediate release tablet, Take 1 tablet (5 mg total) by mouth every 6 (six) hours as needed for severe pain. (Patient not taking: Reported on 01/28/2022), Disp: 5 tablet, Rfl: 0   prochlorperazine (COMPAZINE) 10 MG tablet, Take 1 tablet (10 mg total) by mouth every 6 (six) hours as needed for nausea or vomiting. (Patient not taking: Reported on 01/28/2022), Disp: 30 tablet, Rfl: 1  sertraline (ZOLOFT) 50 MG tablet, Take 50 mg by mouth daily., Disp: , Rfl:    TIROSINT 100 MCG CAPS, Take 100 mcg by mouth every Sunday., Disp: , Rfl:    TIROSINT 88 MCG CAPS, Take 88 mcg by mouth See admin instructions. Take 88 mcg by mouth daily Monday-Saturday, Disp: , Rfl: 10  Current Facility-Administered Medications:    levofloxacin (LEVAQUIN) IVPB 750 mg, 750 mg, Intravenous, Once, Truitt Merle, MD, Last Rate: 100 mL/hr at 03/24/22 1541, 750 mg at 03/24/22 1541  PHYSICAL EXAM: ECOG FS:1 - Symptomatic but completely ambulatory    Vitals:   03/24/22 1312 03/24/22 1332  BP: 132/73   Pulse: 91   Resp: 18   Temp: 99 F (37.2 C) 98.4 F (36.9 C)  TempSrc: Oral Oral  SpO2: 98%   Weight: 168 lb 9.6 oz (76.5 kg)    Physical Exam Vitals and nursing note reviewed.  Constitutional:      Appearance: She is well-developed. She is not ill-appearing or toxic-appearing.  HENT:     Head: Normocephalic.     Comments: No sinus or temporal tenderness.    Nose: Nose normal.     Mouth/Throat:     Pharynx: Oropharynx is clear.  Eyes:     Conjunctiva/sclera: Conjunctivae normal.  Neck:     Vascular: No JVD.  Cardiovascular:     Rate and Rhythm: Normal rate and regular rhythm.     Pulses: Normal pulses.     Heart sounds: Normal heart sounds.  Pulmonary:     Effort: Pulmonary effort is normal.     Breath sounds: Normal breath sounds.   Chest:     Comments: PAC in the left upper chest without erythema.  No tenderness.  No warmth.  Incision is intact. Abdominal:     General: There is no distension.     Palpations: Abdomen is soft. There is no mass.     Tenderness: There is no abdominal tenderness. There is no guarding or rebound.  Musculoskeletal:     Cervical back: Normal range of motion. No rigidity.  Skin:    General: Skin is warm and dry.     Findings: Rash (On face) present.  Neurological:     Mental Status: She is oriented to person, place, and time.        LABORATORY DATA: I have reviewed the data as listed    Latest Ref Rng & Units 03/24/2022   12:27 PM 03/13/2022    9:39 AM 02/27/2022    8:26 AM  CBC  WBC 4.0 - 10.5 K/uL 1.7  13.7  19.5   Hemoglobin 12.0 - 15.0 g/dL 11.8  12.8  13.6   Hematocrit 36.0 - 46.0 % 35.6  38.5  41.1   Platelets 150 - 400 K/uL 267  575  161         Latest Ref Rng & Units 03/24/2022   12:27 PM 03/13/2022    9:39 AM 02/27/2022    8:26 AM  CMP  Glucose 70 - 99 mg/dL 132  172  138   BUN 8 - 23 mg/dL _0 Creatinine 0.44 - 1.00 mg/dL 0.60  0.56  0.70   Sodium 135 - 145 mmol/L 138  138  138   Potassium 3.5 - 5.1 mmol/L 3.6  3.7  3.9   Chloride 98 - 111 mmol/L 103  104  101   CO2 22 - 32 mmol/L _1 Calcium  8.9 - 10.3 mg/dL 9.4  9.7  9.7   Total Protein 6.5 - 8.1 g/dL 7.0  7.8  7.4   Total Bilirubin 0.3 - 1.2 mg/dL 0.4  0.4  0.5   Alkaline Phos 38 - 126 U/L 80  89  141   AST 15 - 41 U/L _0 ALT 0 - 44 U/L 22  25  35        RADIOGRAPHIC STUDIES (from last 24 hours if applicable) I have personally reviewed the radiological images as listed and agreed with the findings in the report. DG Chest 2 View  Result Date: 03/24/2022 CLINICAL DATA:  Fever. Pain at Port-A-Cath site since last night. On chemotherapy for breast cancer. Status post right lumpectomy. EXAM: CHEST - 2 VIEW COMPARISON:  AP chest 02/03/2022 (multiple studies) FINDINGS: Left  chest wall porta catheter tip overlies the mid superior vena cava, unchanged. Surgical clips again overlie the right breast and right axilla. Cardiac silhouette and mediastinal contours are within normal limits. Mild flattening of the diaphragms and mild hyperinflation. Mild left basilar horizontal linear scarring is similar to prior. No focal airspace opacity to indicate pneumonia. No pleural effusion or pneumothorax. Resolution of the prior minimal bilateral costophrenic angle blunting. Mild-to-moderate multilevel degenerative disc changes of the thoracic spine. IMPRESSION: 1. Left chest wall porta catheter tip overlies the mid superior vena cava, unchanged. 2. No acute lung process. 3. Mild hyperinflation. Electronically Signed   By: Yvonne Kendall M.D.   On: 03/24/2022 12:28        Visit Diagnosis: 1. Neutropenic fever ([place])   2. Malignant neoplasm of upper-outer quadrant of right breast in female, estrogen receptor negative ([place])   3. Rash   4. Port-A-Cath in place      No orders of the defined types were placed in this encounter.   All questions were answered. The patient knows to call the clinic with any problems, questions or concerns. No barriers to learning was detected.  I have spent a total of 30 minutes minutes of face-to-face and non-face-to-face time, preparing to see the patient, obtaining and/or reviewing separately obtained history, performing a medically appropriate examination, counseling and educating the patient, ordering tests, documenting clinical information in the electronic health record, and care coordination (communications with other health care professionals or caregivers).    Thank you for allowing me to participate in the care of this patient.    Barrie Folk, PA-C Department of Hematology/Oncology [facility] at [facility] Phone: [phone]  [phone]    03/24/2022 4:50 PM  Addendum I have seen the  patient, examined her. I agree with the assessment and and plan and have edited the notes.   Samantha [name] came in today for fever and malaise at home this morning.  She was afebrile in the clinic, and overall feels well, malaise has resolved.  Labs showed significant neutropenia with ANC 0.1.  She is hemodynamically stable, UA and x-ray were unremarkable.  Blood and urine cultures are pending.  We discussed hospital admission for IV antibiotics, versus outpatient management.  She prefers to be managed in the clinic.  We discussed antibiotics regimen with pharmacy, and recommend IV Levaquin 750 mg daily for today and tomorrow, and Augmentin for 7 days, will watch her cultures results.  She knows to go to ED if she has recurrent fever or not feeling well at home.  Truitt Merle MD 03/24/2022

## 2022-03-24 NOTE — Progress Notes (Signed)
Chest xray order for [facility] visit today 

## 2022-03-24 NOTE — Progress Notes (Signed)
Orders entered for [facility] visit. 

## 2022-03-24 NOTE — Progress Notes (Signed)
CRITICAL VALUE STICKER  CRITICAL VALUE: ANC 0.1  RECEIVER (on-site recipient of call): Thyra Breed, RN  DATE & TIME NOTIFIED: 03/24/22, 1303  MESSENGER (representative from lab): Theodosia Quay  MD NOTIFIED: Sherol Dade, PA-C  TIME OF NOTIFICATION: 1303  RESPONSE: Assess patient.

## 2022-03-25 ENCOUNTER — Inpatient Hospital Stay: Payer: Managed Care, Other (non HMO)

## 2022-03-25 VITALS — BP 117/67 | HR 84 | Temp 98.2°F | Resp 18 | Ht 67.5 in | Wt 167.7 lb

## 2022-03-25 DIAGNOSIS — Z171 Estrogen receptor negative status [ER-]: Secondary | ICD-10-CM

## 2022-03-25 DIAGNOSIS — R509 Fever, unspecified: Secondary | ICD-10-CM

## 2022-03-25 DIAGNOSIS — Z95828 Presence of other vascular implants and grafts: Secondary | ICD-10-CM

## 2022-03-25 DIAGNOSIS — Z5111 Encounter for antineoplastic chemotherapy: Secondary | ICD-10-CM | POA: Diagnosis not present

## 2022-03-25 LAB — URINE CULTURE: Culture: NO GROWTH

## 2022-03-25 MED ORDER — LEVOFLOXACIN IN D5W 750 MG/150ML IV SOLN
750.0000 mg | Freq: Once | INTRAVENOUS | Status: AC
  Administered 2022-03-25: 750 mg via INTRAVENOUS
  Filled 2022-03-25: qty 150

## 2022-03-25 MED ORDER — FAMOTIDINE IN NACL 20-0.9 MG/50ML-% IV SOLN
INTRAVENOUS | Status: AC
  Filled 2022-03-25: qty 50

## 2022-03-25 MED ORDER — SODIUM CHLORIDE 0.9% FLUSH
10.0000 mL | Freq: Once | INTRAVENOUS | Status: AC
  Administered 2022-03-25: 10 mL

## 2022-03-25 MED ORDER — HEPARIN SOD (PORK) LOCK FLUSH 100 UNIT/ML IV SOLN
500.0000 [IU] | Freq: Once | INTRAVENOUS | Status: AC
  Administered 2022-03-25: 500 [IU]

## 2022-03-25 MED ORDER — FAMOTIDINE IN NACL 20-0.9 MG/50ML-% IV SOLN
20.0000 mg | Freq: Once | INTRAVENOUS | Status: AC
  Administered 2022-03-25: 20 mg via INTRAVENOUS

## 2022-03-25 NOTE — Patient Instructions (Signed)
Levofloxacin Injection What is this medication? LEVOFLOXACIN (lee voe FLOX a sin) treats infections caused by bacteria. It belongs to a group of medications called quinolone antibiotics. It will not treat colds, the flu, or infections caused by viruses. This medicine may be used for other purposes; ask your health care provider or pharmacist if you have questions. COMMON BRAND NAME(S): Levaquin What should I tell my care team before I take this medication? They need to know if you have any of these conditions: Bone problems Diabetes Heart disease High blood pressure History of irregular heartbeat History of low levels of potassium in the blood Joint problems Kidney disease Liver disease Mental health condition Myasthenia gravis Seizures Tendon problems Tingling of the fingers or toes, or other nerve disorder An unusual or allergic reaction to levofloxacin, other medications, foods, dyes, or preservatives Pregnant or trying to get pregnant Breast-feeding How should I use this medication? This medication is for infusion into a vein. It is usually given in a hospital or clinic setting. If you get this medication at home, you will be taught how to prepare and give this medication. Use exactly as directed. Take your medication at regular intervals. Do not take your medication more often than directed. It is important that you put your used needles and syringes in a special sharps container. Do not put them in a trash can. If you do not have a sharps container, call your pharmacist or care team to get one. A special MedGuide will be given to you by the pharmacist with each prescription and refill. Be sure to read this information carefully each time. Talk to your care team about the use of this medication in children. While this medication may be prescribed for children as [place] as 6 months for selected conditions, precautions do apply. Overdosage: If you think you have taken too much of this  medicine contact a poison control center or emergency room at once. NOTE: This medicine is only for you. Do not share this medicine with others. What if I miss a dose? If you miss a dose, use it as soon as you can. If it is almost time for your next dose, use only that dose. Do not use double or extra doses. What may interact with this medication? Do not take this medication with any of the following: Cisapride Dronedarone Pimozide Thioridazine This medication may also interact with the following: Certain medications for diabetes, such as glipizide, glyburide, or insulin Certain medications that treat or prevent blood clots, such as warfarin Estrogen or progestin hormones NSAIDS, medications for pain and inflammation, such as ibuprofen or naproxen Other medications that cause heart rhythm changes, such as dofetilide, ziprasidone Steroid medications, such as prednisone or cortisone Theophylline This list may not describe all possible interactions. Give your health care provider a list of all the medicines, herbs, non-prescription drugs, or dietary supplements you use. Also tell them if you smoke, drink alcohol, or use illegal drugs. Some items may interact with your medicine. What should I watch for while using this medication? Tell your care team if your symptoms do not start to get better or if they get worse. Do not treat diarrhea with over the counter products. Contact your care team if you have diarrhea that lasts more than 2 days or if it is severe and watery. Check with your care team if you get an attack of severe diarrhea, nausea and vomiting, or if you sweat a lot. The loss of too much body fluid can  make it dangerous for you to take this medication. This medication may increase blood sugar. Ask your care team if changes in diet or medications are needed if you have diabetes. This medication may affect your coordination, reaction time, or judgment. Do not drive or operate machinery  until you know how this medication affects you. Sit up or stand slowly to reduce the risk of dizzy or fainting spells. Drinking alcohol with this medication can increase the risk of these side effects. This medication can make you more sensitive to the sun. Keep out of the sun. If you cannot avoid being in the sun, wear protective clothing and use sunscreen. Do not use sun lamps or tanning beds/booths. What side effects may I notice from receiving this medication? Side effects that you should report to your care team as soon as possible: Allergic reactions--skin rash, itching, hives, swelling of the face, lips, tongue, or throat Heart rhythm changes--fast or irregular heartbeat, dizziness, feeling faint or lightheaded, chest pain, trouble breathing Increased pressure around the brain--severe headache, blurry vision, change in vision, nausea, vomiting Joint, muscle, or tendon pain, swelling, or stiffness Liver injury--right upper belly pain, loss of appetite, nausea, light-colored stool, dark yellow or brown urine, yellowing skin or eyes, unusual weakness or fatigue Mood and behavior changes--anxiety, nervousness, confusion, hallucinations, irritability, hostility, thoughts of suicide or self-harm, worsening mood, feelings of depression Pain, tingling, or numbness in the hands or feet Redness, blistering, peeling, or loosening of the skin, including inside the mouth Seizures Severe diarrhea, fever Sudden or severe chest, back, or stomach pain Unusual vaginal discharge, itching, or odor Side effects that usually do not require medical attention (report to your care team if they continue or are bothersome): Diarrhea Dizziness Headache Nausea Sensitivity to light Trouble sleeping This list may not describe all possible side effects. Call your doctor for medical advice about side effects. You may report side effects to FDA at 1-800-FDA-1088. Where should I keep my medication? Keep out of the  reach of children and pets. If you are using this medication at home, you will be instructed on how to store this medication. Get rid of any unused medication after the expiration date on the label. NOTE: This sheet is a summary. It may not cover all possible information. If you have questions about this medicine, talk to your doctor, pharmacist, or health care provider.  2023 Elsevier/Gold Standard (2020-06-05 00:00:00)

## 2022-03-26 ENCOUNTER — Ambulatory Visit (HOSPITAL_BASED_OUTPATIENT_CLINIC_OR_DEPARTMENT_OTHER): Payer: Managed Care, Other (non HMO) | Admitting: Physician Assistant

## 2022-03-26 ENCOUNTER — Encounter: Payer: Self-pay | Admitting: Hematology

## 2022-03-26 ENCOUNTER — Telehealth: Payer: Self-pay

## 2022-03-26 DIAGNOSIS — C50411 Malignant neoplasm of upper-outer quadrant of right female breast: Secondary | ICD-10-CM

## 2022-03-26 DIAGNOSIS — D709 Neutropenia, unspecified: Secondary | ICD-10-CM | POA: Diagnosis not present

## 2022-03-26 DIAGNOSIS — R5081 Fever presenting with conditions classified elsewhere: Secondary | ICD-10-CM | POA: Diagnosis not present

## 2022-03-26 DIAGNOSIS — Z171 Estrogen receptor negative status [ER-]: Secondary | ICD-10-CM

## 2022-03-26 MED ORDER — LEVOFLOXACIN 750 MG PO TABS: 750.0000 mg | ORAL_TABLET | Freq: Every day | ORAL | 0 refills | Status: AC

## 2022-03-26 NOTE — Progress Notes (Signed)
I connected with Samantha [name] on 03/26/22 at 11:30 AM EST by telephone and verified that I am speaking with the correct person using two identifiers.   I discussed the limitations, risks, security and privacy concerns of performing an evaluation and management service by telemedicine and the availability of in-person appointments. I also discussed with the patient that there may be a patient responsible charge related to this service. The patient expressed understanding and agreed to proceed.   Other persons participating in the visit and their role in the encounter: none   Patient's location: home  Provider's location: office at [facility] [facility] [place]    Patient Care Team: Crist Infante, MD as PCP - General (Internal Medicine) Mauro Kaufmann, RN as Oncology Nurse Navigator [name], RN as Oncology Nurse Navigator Stark Klein, MD as Consulting Physician (General Surgery) Truitt Merle, MD as Consulting Physician (Hematology) Kyung Rudd, MD as Consulting Physician ([place]) Marcene Corning, MD as Consulting Physician (Endocrinology) Stefanie Libel, MD as Consulting Physician ([place]) Marylynn Pearson, MD as Consulting Physician (Obstetrics and Gynecology)    Name of the patient: Samantha [name]  [id number]  Apr 22, 1960   Date of visit: 03/26/2022    Chief complaint/ Reason for visit- lab results  Oncology History  Malignant neoplasm of upper-outer quadrant of right breast in female, estrogen receptor negative ([place])  12/06/2021 Mammogram   CLINICAL DATA:  Patient returns today to evaluate RIGHT breast calcifications identified on a recent screening mammogram.   EXAM: DIGITAL DIAGNOSTIC UNILATERAL RIGHT MAMMOGRAM  IMPRESSION: Grouped coarse heterogeneous calcifications within the upper-outer quadrant of the RIGHT breast, measuring 6 mm extent. These may be fibroadenomatous calcifications. Stereotactic biopsy is  recommended to exclude malignancy.   12/18/2021 Initial Biopsy   Diagnosis Breast, right, needle core biopsy, upper outer quadrant, x clip - DUCTAL CARCINOMA IN SITU, SOLID AND CRIBRIFORM TYPE WITH COMEDONECROSIS AND ASSOCIATED CALCIFICATIONS, NUCLEAR GRADE 3 OF 3 - FOCAL MICROINVASION IS PRESENT (LESS THAN 1 MM) WITH EVIDENCE OF LYMPHOVASCULAR INVASION - NECROSIS: PRESENT - CALCIFICATIONS: PRESENT - DCIS LENGTH: 7 MM IN GREATEST LINEAR DIMENSION ON FRAGMENTED CORES  PROGNOSTIC INDICATORS Results: IMMUNOHISTOCHEMICAL AND MORPHOMETRIC ANALYSIS PERFORMED MANUALLY The tumor cells are NEGATIVE for Her2 (1+). Estrogen Receptor: 0%, NEGATIVE Progesterone Receptor: 0%, NEGATIVE Proliferation Marker Ki67: 60%   12/23/2021 Initial Diagnosis   Malignant neoplasm of upper-outer quadrant of right breast in female, estrogen receptor negative ([place])   12/23/2021 Imaging   EXAM: ULTRASOUND OF THE RIGHT AXILLA  IMPRESSION: No abnormal appearing RIGHT axillary lymph nodes.   01/08/2022 Cancer Staging   Staging form: Breast, AJCC 8th Edition - Pathologic stage from 01/08/2022: Stage IB (pT1b, pN0, cM0, G3, ER-, PR-, HER2-) - Signed by Truitt Merle, MD on 01/24/2022 Stage prefix: Initial diagnosis Histologic grading system: 3 grade system Residual tumor (R): R0 - None   02/20/2022 -  Chemotherapy   Patient is on Treatment Plan : BREAST TC q21d       Current Therapy: Cytoxan and taxotere day 1 cycle 2 on 03/13/2022  Interval history-Samantha [name] is a 61 year old female with oncologic history as above contacted today via telephone to discuss lab results.  Patient was seen in clinic x 2 days ago and found to have neutropenic fever.  She received 2 days of IV Levaquin and started on Augmentin.  Review of her chart today shows that the blood cultures and urine culture did not have any  growth.  I informed patient of these results.  She tells me she is feeling much better.  She is tolerating Augmentin.  She is  eating and drinking without difficulty.  She has not had any fever.  She is taking Tylenol for the discomfort around her port which is thought to be because of her increased physical activity over the weekend.  She denies any visual signs of infection to report.  Patient states she is overall feeling well.  She has no specific complaints today.   ROS  All other systems are reviewed and are negative for acute change except as noted in the HPI.    Allergies  Allergen Reactions   Nortriptyline Other (See Comments)    depression   Singulair [Montelukast Sodium] Other (See Comments)    Depressed mood   Sulfonamide Derivatives Nausea And Vomiting   Amitriptyline Hcl     Depression mood   Esomeprazole Hives   Gabapentin     Hair Loss   Lexapro [Escitalopram]     Depression mood   Neosporin [Bacitracin-Polymyxin B] Rash     Past Medical History:  Diagnosis Date   Allergy    Anxiety 2015   Result of culmination of super stressful caregiving and deaths of 2 immediate family members.  Overall do pretty well.   Cancer ([place]) 2023   right breast   Depression    GERD (gastroesophageal reflux disease)    Hypothyroidism    Right carpal tunnel syndrome 09/19/2019   Thyroid disease      Past Surgical History:  Procedure Laterality Date   BREAST LUMPECTOMY WITH RADIOACTIVE SEED LOCALIZATION Right 01/08/2022   Procedure: RIGHT BREAST LUMPECTOMY WITH RADIOACTIVE SEED LOCALIZATION;  Surgeon: Stark Klein, MD;  Location: [facility];  Service: General;  Laterality: Right;   PORTACATH PLACEMENT Left 01/28/2022   Procedure: INSERTION PORT-A-CATH;  Surgeon: Stark Klein, MD;  Location: [facility];  Service: General;  Laterality: Left;   SENTINEL NODE BIOPSY Right 01/28/2022   Procedure: RIGHT SENTINEL LYMPH NODE BIOPSY;  Surgeon: Stark Klein, MD;  Location: [facility];  Service: General;  Laterality: Right;   TONSILLECTOMY      Social History   Socioeconomic  History   Marital status: Married    Spouse name: Not on file   Number of children: 1   Years of education: Not on file   Highest education level: Not on file  Occupational History   Not on file  Tobacco Use   Smoking status: Never   Smokeless tobacco: Never  Vaping Use   Vaping Use: Never used  Substance and Sexual Activity   Alcohol use: Yes    Comment: maybe 1 drink 4 times a year   Drug use: No   Sexual activity: Not Currently    Birth control/protection: Post-menopausal  Other Topics Concern   Not on file  Social History Narrative   Not on file   Social Determinants of Health   Financial Resource Strain: Not on file  Food Insecurity: Not on file  Transportation Needs: Not on file  Physical Activity: Not on file  Stress: Not on file  Social Connections: Not on file  Intimate Partner Violence: Not on file    Family History  Problem Relation Age of Onset   Hyperlipidemia Father    Diabetes Maternal Grandmother      Current Outpatient Medications:    levofloxacin (LEVAQUIN) 750 MG tablet, Take 1 tablet (750 mg total) by mouth daily for 5  days., Disp: 5 tablet, Rfl: 0   acetaminophen (TYLENOL) 500 MG tablet, Take 1,000 mg by mouth every 6 (six) hours as needed for moderate pain., Disp: , Rfl:    amoxicillin-clavulanate (AUGMENTIN) 875-125 MG tablet, Take 1 tablet by mouth 2 (two) times daily for 10 days., Disp: 20 tablet, Rfl: 0   Calcium Carb-Cholecalciferol (CALCIUM-VITAMIN D3) 250-125 MG-UNIT TABS, Take 1 tablet once a day, Disp: , Rfl:    Cholecalciferol (VITAMIN D3) 1000 units CAPS, Take 1,000 Units by mouth daily., Disp: , Rfl:    dexamethasone (DECADRON) 4 MG tablet, Take 2 tabs by mouth 2 times daily starting day before chemo. Then take 2 tabs daily for 2 days starting day after chemo. Take with food. (Patient not taking: Reported on 01/28/2022), Disp: 30 tablet, Rfl: 1   EPINEPHrine 0.3 mg/0.3 mL IJ SOAJ injection, Inject 0.3 mg into the muscle as needed for  anaphylaxis., Disp: 2 each, Rfl: 1   famotidine (PEPCID) 20 MG tablet, Take 1 tablet (20 mg total) by mouth daily., Disp: , Rfl:    hydrocortisone 1 % lotion, Apply 1 Application topically 2 (two) times daily., Disp: 118 mL, Rfl: 0   lidocaine-prilocaine (EMLA) cream, Apply to affected area once (Patient not taking: Reported on 01/28/2022), Disp: 30 g, Rfl: 3   LORazepam (ATIVAN) 0.5 MG tablet, Take 0.25-0.5 mg by mouth 2 (two) times daily as needed for anxiety., Disp: , Rfl:    magic mouthwash (nystatin, lidocaine, diphenhydrAMINE, alum & mag hydroxide) suspension, Swish and swallow 5 mLs by mouth 3 (three) times daily as needed for mouth pain. ( Use 1 minute prior to eating as needed for mouth pain.), Disp: 140 mL, Rfl: 0   nystatin (MYCOSTATIN) 100000 UNIT/ML suspension, Swish and swallow 5 mLs in the mouth or throat 4 times daily, Disp: 60 mL, Rfl: 0   ondansetron (ZOFRAN) 8 MG tablet, Take 1 tablet (8 mg total) by mouth every 8 (eight) hours as needed for nausea or vomiting. Start on the third day after chemotherapy. (Patient not taking: Reported on 01/28/2022), Disp: 30 tablet, Rfl: 1   oxyCODONE (OXY IR/ROXICODONE) 5 MG immediate release tablet, Take 1 tablet (5 mg total) by mouth every 6 (six) hours as needed for severe pain. (Patient not taking: Reported on 01/28/2022), Disp: 5 tablet, Rfl: 0   prochlorperazine (COMPAZINE) 10 MG tablet, Take 1 tablet (10 mg total) by mouth every 6 (six) hours as needed for nausea or vomiting. (Patient not taking: Reported on 01/28/2022), Disp: 30 tablet, Rfl: 1   sertraline (ZOLOFT) 50 MG tablet, Take 50 mg by mouth daily., Disp: , Rfl:    TIROSINT 100 MCG CAPS, Take 100 mcg by mouth every Sunday., Disp: , Rfl:    TIROSINT 88 MCG CAPS, Take 88 mcg by mouth See admin instructions. Take 88 mcg by mouth daily Monday-Saturday, Disp: , Rfl: 10  PHYSICAL EXAM: ECOG FS:1 - Symptomatic but completely ambulatory   There were no vitals filed for this  visit.  Patient speaking in clear sentences, no respiratory distress    LABORATORY DATA: I have reviewed the data as listed    Latest Ref Rng & Units 03/24/2022   12:27 PM 03/13/2022    9:39 AM 02/27/2022    8:26 AM  CBC  WBC 4.0 - 10.5 K/uL 1.7  13.7  19.5   Hemoglobin 12.0 - 15.0 g/dL 11.8  12.8  13.6   Hematocrit 36.0 - 46.0 % 35.6  38.5  41.1   Platelets 150 -  400 K/uL 267  575  161         Latest Ref Rng & Units 03/24/2022   12:27 PM 03/13/2022    9:39 AM 02/27/2022    8:26 AM  CMP  Glucose 70 - 99 mg/dL 132  172  138   BUN 8 - 23 mg/dL _0 Creatinine 0.44 - 1.00 mg/dL 0.60  0.56  0.70   Sodium 135 - 145 mmol/L 138  138  138   Potassium 3.5 - 5.1 mmol/L 3.6  3.7  3.9   Chloride 98 - 111 mmol/L 103  104  101   CO2 22 - 32 mmol/L _1 Calcium 8.9 - 10.3 mg/dL 9.4  9.7  9.7   Total Protein 6.5 - 8.1 g/dL 7.0  7.8  7.4   Total Bilirubin 0.3 - 1.2 mg/dL 0.4  0.4  0.5   Alkaline Phos 38 - 126 U/L 80  89  141   AST 15 - 41 U/L _2 ALT 0 - 44 U/L 22  25  35        RADIOGRAPHIC STUDIES: I have personally reviewed the radiological images as listed and agreed with the findings in the report. No images are attached to the encounter. DG Chest 2 View  Result Date: 03/24/2022 CLINICAL DATA:  Fever. Pain at Port-A-Cath site since last night. On chemotherapy for breast cancer. Status post right lumpectomy. EXAM: CHEST - 2 VIEW COMPARISON:  AP chest 02/03/2022 (multiple studies) FINDINGS: Left chest wall porta catheter tip overlies the mid superior vena cava, unchanged. Surgical clips again overlie the right breast and right axilla. Cardiac silhouette and mediastinal contours are within normal limits. Mild flattening of the diaphragms and mild hyperinflation. Mild left basilar horizontal linear scarring is similar to prior. No focal airspace opacity to indicate pneumonia. No pleural effusion or pneumothorax. Resolution of the prior minimal bilateral  costophrenic angle blunting. Mild-to-moderate multilevel degenerative disc changes of the thoracic spine. IMPRESSION: 1. Left chest wall porta catheter tip overlies the mid superior vena cava, unchanged. 2. No acute lung process. 3. Mild hyperinflation. Electronically Signed   By: Yvonne Kendall M.D.   On: 03/24/2022 12:28     ASSESSMENT & PLAN: Patient is a 62 y.o. female with oncologic history of triple negative breast cancer followed by Dr. Burr Medico.  #) Neutropenic fever -Blood cultures and urine are negative for any growth.  Patient has not been febrile at home.  She will continue Augmentin and I have prescribed 5 days of Levaquin to complete a 7-day course as she already had 2 days of IV Levaquin in clinic. -Discussed neutropenic precautions with patient.  # Triple negative breast cancer -Next clinic appointment and treatment scheduled for 04/03/2022.  Direct ED precautions discussed should any symptoms worsen.   Visit Diagnosis: 1. Neutropenic fever ([place])   2. Malignant neoplasm of upper-outer quadrant of right breast in female, estrogen receptor negative ([place])      No orders of the defined types were placed in this encounter.   All questions were answered. The patient knows to call the clinic with any problems, questions or concerns. No barriers to learning was detected.  Time spent with patient on telephone encounter: 10 minutes   Thank you for allowing me to participate in the care of this patient.    Barrie Folk, PA-C Department of Hematology/Oncology [facility] at [place]  [facility] Phone: [phone]  [phone]    03/26/2022 11:43 AM

## 2022-03-26 NOTE — Telephone Encounter (Signed)
Faxed last office note as per [place] [facility] at [place]

## 2022-03-27 ENCOUNTER — Other Ambulatory Visit: Payer: Self-pay

## 2022-03-28 ENCOUNTER — Other Ambulatory Visit: Payer: Self-pay

## 2022-03-29 LAB — CULTURE, BLOOD (SINGLE)
Culture: NO GROWTH
Culture: NO GROWTH
Special Requests: ADEQUATE

## 2022-04-02 MED FILL — Dexamethasone Sodium Phosphate Inj 100 MG/10ML: INTRAMUSCULAR | Qty: 1 | Status: AC

## 2022-04-02 NOTE — Assessment & Plan Note (Signed)
IDC and DCIS, Stage IA, pT1b, cN0, triple negative, Grade 3 -found on screening mammogram. S/p lumpectomy on 01/08/22 with Dr. Barry Dienes, path showed: 8 mm invasive and in situ ductal carcinoma with negative margins. Repeat prognostic panel confirmed triple negative disease.  -lymph node biopsies 01/28/22 showed negative nodes (0/2). Port placed at time of procedure. Postoperative course complicated by pneumothorax, which has resolved. -she began adjuvant TC on 02/20/22, plan for 4 cycles. She had neutropenic fever after C2 wo GCSF (held due to sever bone pain), and skin rashes

## 2022-04-02 NOTE — Progress Notes (Unsigned)
[place]   [phone] Fax:([phone]) [phone]   Clinic Follow up Note   Patient Care Team: Crist Infante, MD as PCP - General (Internal Medicine) Mauro Kaufmann, RN as Oncology Nurse Navigator [name], RN as Oncology Nurse Navigator Stark Klein, MD as Consulting Physician (General Surgery) Truitt Merle, MD as Consulting Physician (Hematology) Kyung Rudd, MD as Consulting Physician (Radiation Oncology) Marcene Corning, MD as Consulting Physician (Endocrinology) Stefanie Libel, MD as Consulting Physician ([place]) Marylynn Pearson, MD as Consulting Physician (Obstetrics and Gynecology)  Date of Service:  04/03/2022  CHIEF COMPLAINT: f/u of right breast cancer    CURRENT THERAPY:   Adjuvant TC, q21d, starting 02/20/22     ASSESSMENT:  Samantha [name] is a 61 y.o. female with   Malignant neoplasm of upper-outer quadrant of right breast in female, estrogen receptor negative ([place])  IDC and DCIS, Stage IA, pT1b, cN0, triple negative, Grade 3 -found on screening mammogram. S/p lumpectomy on 01/08/22 with Dr. Barry Dienes, path showed: 8 mm invasive and in situ ductal carcinoma with negative margins. Repeat prognostic panel confirmed triple negative disease.  -lymph node biopsies 01/28/22 showed negative nodes (0/2). Port placed at time of procedure. Postoperative course complicated by pneumothorax, which has resolved. -she began adjuvant TC on 02/20/22, plan for 4 cycles. She had neutropenic fever after C2 wo GCSF (held due to sever bone pain), and skin rashes  -She has recovered well from last cycle chemotherapy.  Lab reviewed, will proceed to cycle 3 today.  Will add premedication Pepcid to skin rash.  She will get Neulasta injection at home.   PLAN: -add on Pre meds for allergic reaction( rash) -proceed with C3 TC at full dose - prescribe Tramadol for GCSF related bone pain  -lab,flush,f/u and chemo 04/24/2022  SUMMARY OF ONCOLOGIC  HISTORY: Oncology History  Malignant neoplasm of upper-outer quadrant of right breast in female, estrogen receptor negative ([place])  12/06/2021 Mammogram   CLINICAL DATA:  Patient returns today to evaluate RIGHT breast calcifications identified on a recent screening mammogram.   EXAM: DIGITAL DIAGNOSTIC UNILATERAL RIGHT MAMMOGRAM  IMPRESSION: Grouped coarse heterogeneous calcifications within the upper-outer quadrant of the RIGHT breast, measuring 6 mm extent. These may be fibroadenomatous calcifications. Stereotactic biopsy is recommended to exclude malignancy.   12/18/2021 Initial Biopsy   Diagnosis Breast, right, needle core biopsy, upper outer quadrant, x clip - DUCTAL CARCINOMA IN SITU, SOLID AND CRIBRIFORM TYPE WITH COMEDONECROSIS AND ASSOCIATED CALCIFICATIONS, NUCLEAR GRADE 3 OF 3 - FOCAL MICROINVASION IS PRESENT (LESS THAN 1 MM) WITH EVIDENCE OF LYMPHOVASCULAR INVASION - NECROSIS: PRESENT - CALCIFICATIONS: PRESENT - DCIS LENGTH: 7 MM IN GREATEST LINEAR DIMENSION ON FRAGMENTED CORES  PROGNOSTIC INDICATORS Results: IMMUNOHISTOCHEMICAL AND MORPHOMETRIC ANALYSIS PERFORMED MANUALLY The tumor cells are NEGATIVE for Her2 (1+). Estrogen Receptor: 0%, NEGATIVE Progesterone Receptor: 0%, NEGATIVE Proliferation Marker Ki67: 60%   12/23/2021 Initial Diagnosis   Malignant neoplasm of upper-outer quadrant of right breast in female, estrogen receptor negative ([place])   12/23/2021 Imaging   EXAM: ULTRASOUND OF THE RIGHT AXILLA  IMPRESSION: No abnormal appearing RIGHT axillary lymph nodes.   01/08/2022 Cancer Staging   Staging form: Breast, AJCC 8th Edition - Pathologic stage from 01/08/2022: Stage IB (pT1b, pN0, cM0, G3, ER-, PR-, HER2-) - Signed by Truitt Merle, MD on 01/24/2022 Stage prefix: Initial diagnosis Histologic grading system: 3 grade system Residual tumor (R): R0 - None   02/20/2022 -  Chemotherapy   Patient is on Treatment Plan : BREAST TC  q21d        INTERVAL HISTORY:   Samantha [name] is here for a follow up of right breast cancer   She was last seen by me on  03/13/2022 She presents to the clinic  Pt reports she is doing well.she was seen by symptom management clinic for skin rash and a neutropenic fever after last cycle chemotherapy, we treated with oral antibiotics in outpatient, she has no recurrent fever and recovered well.  Skin rash rash has resolved.  No other new complaints.   All other systems were reviewed with the patient and are negative.  MEDICAL HISTORY:  Past Medical History:  Diagnosis Date   Allergy    Anxiety 2015   Result of culmination of super stressful caregiving and deaths of 2 immediate family members.  Overall do pretty well.   Cancer [facility]) 2023   right breast   Depression    GERD (gastroesophageal reflux disease)    Hypothyroidism    Right carpal tunnel syndrome 09/19/2019   Thyroid disease     SURGICAL HISTORY: Past Surgical History:  Procedure Laterality Date   BREAST LUMPECTOMY WITH RADIOACTIVE SEED LOCALIZATION Right 01/08/2022   Procedure: RIGHT BREAST LUMPECTOMY WITH RADIOACTIVE SEED LOCALIZATION;  Surgeon: Stark Klein, MD;  Location: [place];  Service: General;  Laterality: Right;   PORTACATH PLACEMENT Left 01/28/2022   Procedure: INSERTION PORT-A-CATH;  Surgeon: Stark Klein, MD;  Location: [place];  Service: General;  Laterality: Left;   SENTINEL NODE BIOPSY Right 01/28/2022   Procedure: RIGHT SENTINEL LYMPH NODE BIOPSY;  Surgeon: Stark Klein, MD;  Location: [facility];  Service: General;  Laterality: Right;   TONSILLECTOMY      I have reviewed the social history and family history with the patient and they are unchanged from previous note.  ALLERGIES:  is allergic to nortriptyline, singulair [montelukast sodium], sulfonamide derivatives, amitriptyline hcl, esomeprazole, gabapentin, lexapro [escitalopram], and neosporin [bacitracin-polymyxin b].  MEDICATIONS:  Current  Outpatient Medications  Medication Sig Dispense Refill   traMADol (ULTRAM) 50 MG tablet Take 1 tablet (50 mg total) by mouth every 8 (eight) hours as needed. 20 tablet 0   acetaminophen (TYLENOL) 500 MG tablet Take 1,000 mg by mouth every 6 (six) hours as needed for moderate pain.     amoxicillin-clavulanate (AUGMENTIN) 875-125 MG tablet Take 1 tablet by mouth 2 (two) times daily for 10 days. 20 tablet 0   Calcium Carb-Cholecalciferol (CALCIUM-VITAMIN D3) 250-125 MG-UNIT TABS Take 1 tablet once a day     Cholecalciferol (VITAMIN D3) 1000 units CAPS Take 1,000 Units by mouth daily.     dexamethasone (DECADRON) 4 MG tablet Take 2 tabs by mouth 2 times daily starting day before chemo. Then take 2 tabs daily for 2 days starting day after chemo. Take with food. (Patient not taking: Reported on 01/28/2022) 30 tablet 1   EPINEPHrine 0.3 mg/0.3 mL IJ SOAJ injection Inject 0.3 mg into the muscle as needed for anaphylaxis. 2 each 1   famotidine (PEPCID) 20 MG tablet Take 1 tablet (20 mg total) by mouth daily.     hydrocortisone 1 % lotion Apply 1 Application topically 2 (two) times daily. 118 mL 0   lidocaine-prilocaine (EMLA) cream Apply to affected area once (Patient not taking: Reported on 01/28/2022) 30 g 3   LORazepam (ATIVAN) 0.5 MG tablet Take 0.25-0.5 mg by mouth 2 (two) times daily as needed for anxiety.     magic mouthwash (nystatin, lidocaine, diphenhydrAMINE, alum & mag  hydroxide) suspension Swish and swallow 5 mLs by mouth 3 (three) times daily as needed for mouth pain. ( Use 1 minute prior to eating as needed for mouth pain.) 140 mL 0   nystatin (MYCOSTATIN) 100000 UNIT/ML suspension Swish and swallow 5 mLs in the mouth or throat 4 times daily 60 mL 0   ondansetron (ZOFRAN) 8 MG tablet Take 1 tablet (8 mg total) by mouth every 8 (eight) hours as needed for nausea or vomiting. Start on the third day after chemotherapy. (Patient not taking: Reported on 01/28/2022) 30 tablet 1   oxyCODONE (OXY  IR/ROXICODONE) 5 MG immediate release tablet Take 1 tablet (5 mg total) by mouth every 6 (six) hours as needed for severe pain. (Patient not taking: Reported on 01/28/2022) 5 tablet 0   pegfilgrastim (NEULASTA) 6 MG/0.6ML injection Inject 6 mg into the skin once.  Inject 0.6 mLs (6 mg total) into the skin once for 1 dose.  Administer at least 24hrs post end time of last chemotherapy drug.     prochlorperazine (COMPAZINE) 10 MG tablet Take 1 tablet (10 mg total) by mouth every 6 (six) hours as needed for nausea or vomiting. (Patient not taking: Reported on 01/28/2022) 30 tablet 1   sertraline (ZOLOFT) 50 MG tablet Take 50 mg by mouth daily.     TIROSINT 100 MCG CAPS Take 100 mcg by mouth every Sunday.     TIROSINT 88 MCG CAPS Take 88 mcg by mouth See admin instructions. Take 88 mcg by mouth daily Monday-Saturday  10   No current facility-administered medications for this visit.   Facility-Administered Medications Ordered in Other Visits  Medication Dose Route Frequency Provider Last Rate Last Admin   cyclophosphamide (CYTOXAN) 1,140 mg in sodium chloride 0.9 % 250 mL chemo infusion  600 mg/m2 (Treatment Plan Recorded) Intravenous Once Truitt Merle, MD       dexamethasone (DECADRON) 10 mg in sodium chloride 0.9 % 50 mL IVPB  10 mg Intravenous Once Truitt Merle, MD 204 mL/hr at 04/03/22 0943 10 mg at 04/03/22 0943   DOCEtaxel (TAXOTERE) 140 mg in sodium chloride 0.9 % 250 mL chemo infusion  75 mg/m2 (Treatment Plan Recorded) Intravenous Once Truitt Merle, MD        PHYSICAL EXAMINATION: ECOG PERFORMANCE STATUS: 1 - Symptomatic but completely ambulatory  Vitals:   04/03/22 0850  BP: 123/73  Pulse: 93  Resp: 17  Temp: 98.4 F (36.9 C)  SpO2: 97%   Wt Readings from Last 3 Encounters:  04/03/22 169 lb 12.8 oz (77 kg)  03/25/22 167 lb 11.2 oz (76.1 kg)  03/24/22 168 lb 9.6 oz (76.5 kg)     GENERAL:alert, no distress and comfortable SKIN: skin color, texture, turgor are normal, no rashes or  significant lesions EYES: normal, Conjunctiva are pink and non-injected, sclera clear NECK: supple, thyroid normal size, non-tender, without nodularity LYMPH:  no palpable lymphadenopathy in the cervical, axillary  LUNGS: clear to auscultation and percussion with normal breathing effort HEART: regular rate & rhythm and no murmurs and no lower extremity edema ABDOMEN:abdomen soft, non-tender and normal bowel sounds Musculoskeletal:no cyanosis of digits and no clubbing  NEURO: alert & oriented x 3 with fluent speech, no focal motor/sensory deficits  LABORATORY DATA:  I have reviewed the data as listed    Latest Ref Rng & Units 04/03/2022    8:23 AM 03/24/2022   12:27 PM 03/13/2022    9:39 AM  CBC  WBC 4.0 - 10.5 K/uL 19.1  1.7  13.7   Hemoglobin 12.0 - 15.0 g/dL 12.9  11.8  12.8   Hematocrit 36.0 - 46.0 % 39.1  35.6  38.5   Platelets 150 - 400 K/uL 333  267  575         Latest Ref Rng & Units 04/03/2022    8:23 AM 03/24/2022   12:27 PM 03/13/2022    9:39 AM  CMP  Glucose 70 - 99 mg/dL 208  132  172   BUN 8 - 23 mg/dL _0 Creatinine 0.44 - 1.00 mg/dL 0.61  0.60  0.56   Sodium 135 - 145 mmol/L 138  138  138   Potassium 3.5 - 5.1 mmol/L 3.8  3.6  3.7   Chloride 98 - 111 mmol/L 105  103  104   CO2 22 - 32 mmol/L _1 Calcium 8.9 - 10.3 mg/dL 9.5  9.4  9.7   Total Protein 6.5 - 8.1 g/dL 7.4  7.0  7.8   Total Bilirubin 0.3 - 1.2 mg/dL 0.3  0.4  0.4   Alkaline Phos 38 - 126 U/L 86  80  89   AST 15 - 41 U/L _2 ALT 0 - 44 U/L _3 RADIOGRAPHIC STUDIES: I have personally reviewed the radiological images as listed and agreed with the findings in the report. No results found.    No orders of the defined types were placed in this encounter.  All questions were answered. The patient knows to call the clinic with any problems, questions or concerns. No barriers to learning was detected. The total time spent in the appointment was 30  minutes.     Truitt Merle, MD 04/03/2022   Felicity Coyer, CMA, am acting as scribe for Truitt Merle, MD.   I have reviewed the above documentation for accuracy and completeness, and I agree with the above.

## 2022-04-03 ENCOUNTER — Encounter: Payer: Self-pay | Admitting: Hematology

## 2022-04-03 ENCOUNTER — Inpatient Hospital Stay (HOSPITAL_BASED_OUTPATIENT_CLINIC_OR_DEPARTMENT_OTHER): Payer: Managed Care, Other (non HMO) | Admitting: Hematology

## 2022-04-03 ENCOUNTER — Inpatient Hospital Stay: Payer: Managed Care, Other (non HMO)

## 2022-04-03 VITALS — BP 123/73 | HR 93 | Temp 98.4°F | Resp 17 | Ht 67.5 in | Wt 169.8 lb

## 2022-04-03 DIAGNOSIS — C50411 Malignant neoplasm of upper-outer quadrant of right female breast: Secondary | ICD-10-CM

## 2022-04-03 DIAGNOSIS — Z171 Estrogen receptor negative status [ER-]: Secondary | ICD-10-CM | POA: Diagnosis not present

## 2022-04-03 DIAGNOSIS — Z5111 Encounter for antineoplastic chemotherapy: Secondary | ICD-10-CM | POA: Diagnosis not present

## 2022-04-03 DIAGNOSIS — Z95828 Presence of other vascular implants and grafts: Secondary | ICD-10-CM

## 2022-04-03 LAB — CBC WITH DIFFERENTIAL (CANCER CENTER ONLY)
Abs Immature Granulocytes: 0.2 10*3/uL — ABNORMAL HIGH (ref 0.00–0.07)
Basophils Absolute: 0.1 10*3/uL (ref 0.0–0.1)
Basophils Relative: 0 %
Eosinophils Absolute: 0 10*3/uL (ref 0.0–0.5)
Eosinophils Relative: 0 %
HCT: 39.1 % (ref 36.0–46.0)
Hemoglobin: 12.9 g/dL (ref 12.0–15.0)
Immature Granulocytes: 1 %
Lymphocytes Relative: 9 %
Lymphs Abs: 1.7 10*3/uL (ref 0.7–4.0)
MCH: 30.5 pg (ref 26.0–34.0)
MCHC: 33 g/dL (ref 30.0–36.0)
MCV: 92.4 fL (ref 80.0–100.0)
Monocytes Absolute: 0.6 10*3/uL (ref 0.1–1.0)
Monocytes Relative: 3 %
Neutro Abs: 16.6 10*3/uL — ABNORMAL HIGH (ref 1.7–7.7)
Neutrophils Relative %: 87 %
Platelet Count: 333 10*3/uL (ref 150–400)
RBC: 4.23 MIL/uL (ref 3.87–5.11)
RDW: 14 % (ref 11.5–15.5)
WBC Count: 19.1 10*3/uL — ABNORMAL HIGH (ref 4.0–10.5)
nRBC: 0 % (ref 0.0–0.2)

## 2022-04-03 LAB — CMP (CANCER CENTER ONLY)
ALT: 17 U/L (ref 0–44)
AST: 18 U/L (ref 15–41)
Albumin: 4.2 g/dL (ref 3.5–5.0)
Alkaline Phosphatase: 86 U/L (ref 38–126)
Anion gap: 7 (ref 5–15)
BUN: 16 mg/dL (ref 8–23)
CO2: 26 mmol/L (ref 22–32)
Calcium: 9.5 mg/dL (ref 8.9–10.3)
Chloride: 105 mmol/L (ref 98–111)
Creatinine: 0.61 mg/dL (ref 0.44–1.00)
GFR, Estimated: >60 mL/min (ref >60)
Glucose, Bld: 208 mg/dL — ABNORMAL HIGH (ref 70–99)
Potassium: 3.8 mmol/L (ref 3.5–5.1)
Sodium: 138 mmol/L (ref 135–145)
Total Bilirubin: 0.3 mg/dL (ref 0.3–1.2)
Total Protein: 7.4 g/dL (ref 6.5–8.1)

## 2022-04-03 MED ORDER — SODIUM CHLORIDE 0.9% FLUSH
10.0000 mL | INTRAVENOUS | Status: DC | PRN
  Administered 2022-04-03: 10 mL

## 2022-04-03 MED ORDER — FAMOTIDINE IN NACL 20-0.9 MG/50ML-% IV SOLN
20.0000 mg | Freq: Once | INTRAVENOUS | Status: AC
  Administered 2022-04-03: 20 mg via INTRAVENOUS
  Filled 2022-04-03: qty 50

## 2022-04-03 MED ORDER — SODIUM CHLORIDE 0.9 % IV SOLN
600.0000 mg/m2 | Freq: Once | INTRAVENOUS | Status: AC
  Administered 2022-04-03: 1140 mg via INTRAVENOUS
  Filled 2022-04-03: qty 57

## 2022-04-03 MED ORDER — SODIUM CHLORIDE 0.9 % IV SOLN
10.0000 mg | Freq: Once | INTRAVENOUS | Status: AC
  Administered 2022-04-03: 10 mg via INTRAVENOUS
  Filled 2022-04-03: qty 10

## 2022-04-03 MED ORDER — SODIUM CHLORIDE 0.9% FLUSH
10.0000 mL | Freq: Once | INTRAVENOUS | Status: AC
  Administered 2022-04-03: 10 mL

## 2022-04-03 MED ORDER — SODIUM CHLORIDE 0.9 % IV SOLN: Freq: Once | INTRAVENOUS | Status: AC

## 2022-04-03 MED ORDER — NYSTATIN 100000 UNIT/ML MT SUSP: 5.0000 mL | Freq: Four times a day (QID) | OROMUCOSAL | 0 refills | Status: DC

## 2022-04-03 MED ORDER — TRAMADOL HCL 50 MG PO TABS: 50.0000 mg | ORAL_TABLET | Freq: Three times a day (TID) | ORAL | 0 refills | Status: DC | PRN

## 2022-04-03 MED ORDER — SODIUM CHLORIDE 0.9 % IV SOLN
75.0000 mg/m2 | Freq: Once | INTRAVENOUS | Status: AC
  Administered 2022-04-03: 140 mg via INTRAVENOUS
  Filled 2022-04-03: qty 14

## 2022-04-03 MED ORDER — PALONOSETRON HCL INJECTION 0.25 MG/5ML
0.2500 mg | Freq: Once | INTRAVENOUS | Status: AC
  Administered 2022-04-03: 0.25 mg via INTRAVENOUS
  Filled 2022-04-03: qty 5

## 2022-04-03 MED ORDER — HEPARIN SOD (PORK) LOCK FLUSH 100 UNIT/ML IV SOLN
500.0000 [IU] | Freq: Once | INTRAVENOUS | Status: AC | PRN
  Administered 2022-04-03: 500 [IU]

## 2022-04-04 ENCOUNTER — Other Ambulatory Visit: Payer: Self-pay

## 2022-04-04 ENCOUNTER — Inpatient Hospital Stay: Payer: Managed Care, Other (non HMO)

## 2022-04-04 ENCOUNTER — Other Ambulatory Visit: Payer: Self-pay | Admitting: Hematology

## 2022-04-04 DIAGNOSIS — Z171 Estrogen receptor negative status [ER-]: Secondary | ICD-10-CM

## 2022-04-04 DIAGNOSIS — Z5111 Encounter for antineoplastic chemotherapy: Secondary | ICD-10-CM | POA: Diagnosis not present

## 2022-04-04 MED ORDER — PEGFILGRASTIM INJECTION 6 MG/0.6ML ~~LOC~~
6.0000 mg | PREFILLED_SYRINGE | Freq: Once | SUBCUTANEOUS | Status: AC
  Administered 2022-04-04: 6 mg via SUBCUTANEOUS
  Filled 2022-04-04: qty 0.6

## 2022-04-04 NOTE — Progress Notes (Signed)
One time Authorization given by Lucilla Edin with Christella Scheuermann [id number]) given for Nuelasta '6mg'$  SubQ injection to be administered within the [facility].  Authorization#[id number] effective date 04/03/2022 through 04/10/2022.  Fax authorization was received from [facility] and sent to [facility] to be scanned into pt's chart.

## 2022-04-08 ENCOUNTER — Encounter: Payer: Self-pay | Admitting: Hematology

## 2022-04-10 ENCOUNTER — Inpatient Hospital Stay (HOSPITAL_BASED_OUTPATIENT_CLINIC_OR_DEPARTMENT_OTHER): Payer: Managed Care, Other (non HMO) | Admitting: Physician Assistant

## 2022-04-10 ENCOUNTER — Encounter: Payer: Self-pay | Admitting: Hematology

## 2022-04-10 ENCOUNTER — Other Ambulatory Visit (HOSPITAL_COMMUNITY): Payer: Self-pay

## 2022-04-10 VITALS — BP 126/75 | HR 98 | Temp 98.7°F | Resp 18 | Wt 168.2 lb

## 2022-04-10 DIAGNOSIS — K1230 Oral mucositis (ulcerative), unspecified: Secondary | ICD-10-CM | POA: Diagnosis not present

## 2022-04-10 DIAGNOSIS — C50411 Malignant neoplasm of upper-outer quadrant of right female breast: Secondary | ICD-10-CM

## 2022-04-10 DIAGNOSIS — Z5111 Encounter for antineoplastic chemotherapy: Secondary | ICD-10-CM | POA: Diagnosis not present

## 2022-04-10 DIAGNOSIS — Z171 Estrogen receptor negative status [ER-]: Secondary | ICD-10-CM | POA: Diagnosis not present

## 2022-04-10 MED ORDER — NYSTATIN 100000 UNIT/ML MT SUSP
5.0000 mL | Freq: Four times a day (QID) | OROMUCOSAL | 0 refills | Status: DC | PRN
  Filled 2022-04-10: qty 240, 12d supply, fill #0

## 2022-04-10 NOTE — Progress Notes (Signed)
Symptom Management Consult note [place]    Patient Care Team: Crist Infante, MD as PCP - General (Internal Medicine) Mauro Kaufmann, RN as Oncology Nurse Navigator [name], RN as Oncology Nurse Navigator Stark Klein, MD as Consulting Physician (General Surgery) Truitt Merle, MD as Consulting Physician (Hematology) Kyung Rudd, MD as Consulting Physician (Radiation Oncology) Marcene Corning, MD as Consulting Physician (Endocrinology) Stefanie Libel, MD as Consulting Physician ([place]) Marylynn Pearson, MD as Consulting Physician (Obstetrics and Gynecology)    Name of the patient: Samantha [name]  [id number]  01-07-1961   Date of visit: 04/10/2022   Chief Complaint/Reason for visit: mouth pain   Current Therapy:  Cytoxan and taxotere    Last treatment:  Day 1   Cycle 3 on 04/03/22   ASSESSMENT & PLAN: Patient is a 61 y.o. female  with oncologic history of triple negative breast cancer followed by Dr. Burr Medico.  I have viewed most recent oncology note and lab work.    #) Triple negative breast cancer -Patient noticed peripheral neuropathy on finger tips after last treatment. Only mild. Advised her to discuss with oncologist at upcoming appointment. - Next appointment with oncologist is 04/24/22   #) Mucositis -Grade 2 on exam. Patient has been using nystatin suspension which has helped with her thrush however not the ulcers.  -Patient has magic mouthwash w/ lidocaine at home however does not like the numbing sensation. Prescription sent to pharmacy for magic mouthwash w/o lidocaine     Heme/Onc History: Oncology History  Malignant neoplasm of upper-outer quadrant of right breast in female, estrogen receptor negative ([place])  12/06/2021 Mammogram   CLINICAL DATA:  Patient returns today to evaluate RIGHT breast calcifications identified on a recent screening mammogram.   EXAM: DIGITAL DIAGNOSTIC UNILATERAL RIGHT  MAMMOGRAM  IMPRESSION: Grouped coarse heterogeneous calcifications within the upper-outer quadrant of the RIGHT breast, measuring 6 mm extent. These may be fibroadenomatous calcifications. Stereotactic biopsy is recommended to exclude malignancy.   12/18/2021 Initial Biopsy   Diagnosis Breast, right, needle core biopsy, upper outer quadrant, x clip - DUCTAL CARCINOMA IN SITU, SOLID AND CRIBRIFORM TYPE WITH COMEDONECROSIS AND ASSOCIATED CALCIFICATIONS, NUCLEAR GRADE 3 OF 3 - FOCAL MICROINVASION IS PRESENT (LESS THAN 1 MM) WITH EVIDENCE OF LYMPHOVASCULAR INVASION - NECROSIS: PRESENT - CALCIFICATIONS: PRESENT - DCIS LENGTH: 7 MM IN GREATEST LINEAR DIMENSION ON FRAGMENTED CORES  PROGNOSTIC INDICATORS Results: IMMUNOHISTOCHEMICAL AND MORPHOMETRIC ANALYSIS PERFORMED MANUALLY The tumor cells are NEGATIVE for Her2 (1+). Estrogen Receptor: 0%, NEGATIVE Progesterone Receptor: 0%, NEGATIVE Proliferation Marker Ki67: 60%   12/23/2021 Initial Diagnosis   Malignant neoplasm of upper-outer quadrant of right breast in female, estrogen receptor negative ([place])   12/23/2021 Imaging   EXAM: ULTRASOUND OF THE RIGHT AXILLA  IMPRESSION: No abnormal appearing RIGHT axillary lymph nodes.   01/08/2022 Cancer Staging   Staging form: Breast, AJCC 8th Edition - Pathologic stage from 01/08/2022: Stage IB (pT1b, pN0, cM0, G3, ER-, PR-, HER2-) - Signed by Truitt Merle, MD on 01/24/2022 Stage prefix: Initial diagnosis Histologic grading system: 3 grade system Residual tumor (R): R0 - None   02/20/2022 -  Chemotherapy   Patient is on Treatment Plan : BREAST TC q21d         Interval history-: Samantha [name] is a 61 y.o. female with oncologic history as above presenting to [facility] today with chief complaint of mouth pain. She presents unaccompanied to clinic today.  Patient reports she developed thrush on her tongue  and sores on the roof of her mouth x 4 days.  Patient states she had similar symptoms after previous  treatments that resolved with nystatin.  She has been using that for the last x 2 days and thrush has improved.  She states she only has white spots on the side of her tongue now.  She has burning pain associated with the sores and feels like a constant irritation.  She is having a hard time eating foods she typically likes because of her altered taste from chemotherapy.  She is able to drink orange Gatorade and thinks she is well-hydrated.  She has a prescription for Magic mouthwash with lidocaine in it however does not like how it made her throat feel numb and did not use it for her symptoms at this time for that reason.  She reports some numbness in her fingertips since last treatment.  She states that is very mild and overall not terribly bothersome.      ROS  All other systems are reviewed and are negative for acute change except as noted in the HPI.    Allergies  Allergen Reactions   Nortriptyline Other (See Comments)    depression   Singulair [Montelukast Sodium] Other (See Comments)    Depressed mood   Sulfonamide Derivatives Nausea And Vomiting   Amitriptyline Hcl     Depression mood   Esomeprazole Hives   Gabapentin     Hair Loss   Lexapro [Escitalopram]     Depression mood   Neosporin [Bacitracin-Polymyxin B] Rash     Past Medical History:  Diagnosis Date   Allergy    Anxiety 2015   Result of culmination of super stressful caregiving and deaths of 2 immediate family members.  Overall do pretty well.   Cancer ([place]) 2023   right breast   Depression    GERD (gastroesophageal reflux disease)    Hypothyroidism    Right carpal tunnel syndrome 09/19/2019   Thyroid disease      Past Surgical History:  Procedure Laterality Date   BREAST LUMPECTOMY WITH RADIOACTIVE SEED LOCALIZATION Right 01/08/2022   Procedure: RIGHT BREAST LUMPECTOMY WITH RADIOACTIVE SEED LOCALIZATION;  Surgeon: Stark Klein, MD;  Location: [facility];  Service: General;  Laterality: Right;   PORTACATH  PLACEMENT Left 01/28/2022   Procedure: INSERTION PORT-A-CATH;  Surgeon: Stark Klein, MD;  Location: [facility];  Service: General;  Laterality: Left;   SENTINEL NODE BIOPSY Right 01/28/2022   Procedure: RIGHT SENTINEL LYMPH NODE BIOPSY;  Surgeon: Stark Klein, MD;  Location: [facility];  Service: General;  Laterality: Right;   TONSILLECTOMY      Social History   Socioeconomic History   Marital status: Married    Spouse name: Not on file   Number of children: 1   Years of education: Not on file   Highest education level: Not on file  Occupational History   Not on file  Tobacco Use   Smoking status: Never   Smokeless tobacco: Never  Vaping Use   Vaping Use: Never used  Substance and Sexual Activity   Alcohol use: Yes    Comment: maybe 1 drink 4 times a year   Drug use: No   Sexual activity: Not Currently    Birth control/protection: Post-menopausal  Other Topics Concern   Not on file  Social History Narrative   Not on file   Social Determinants of Health   Financial Resource Strain: Not on file  Food Insecurity: Not on  file  Transportation Needs: Not on file  Physical Activity: Not on file  Stress: Not on file  Social Connections: Not on file  Intimate Partner Violence: Not on file    Family History  Problem Relation Age of Onset   Hyperlipidemia Father    Diabetes Maternal Grandmother      Current Outpatient Medications:    magic mouthwash (nystatin, diphenhydrAMINE, alum & mag hydroxide) suspension mixture, Swish and spit 5 mLs 4 (four) times daily as needed for mouth pain., Disp: 240 mL, Rfl: 0   acetaminophen (TYLENOL) 500 MG tablet, Take 1,000 mg by mouth every 6 (six) hours as needed for moderate pain., Disp: , Rfl:    Calcium Carb-Cholecalciferol (CALCIUM-VITAMIN D3) 250-125 MG-UNIT TABS, Take 1 tablet once a day, Disp: , Rfl:    Cholecalciferol (VITAMIN D3) 1000 units CAPS, Take 1,000 Units by mouth daily., Disp: , Rfl:     dexamethasone (DECADRON) 4 MG tablet, Take 2 tabs by mouth 2 times daily starting day before chemo. Then take 2 tabs daily for 2 days starting day after chemo. Take with food. (Patient not taking: Reported on 01/28/2022), Disp: 30 tablet, Rfl: 1   EPINEPHrine 0.3 mg/0.3 mL IJ SOAJ injection, Inject 0.3 mg into the muscle as needed for anaphylaxis., Disp: 2 each, Rfl: 1   famotidine (PEPCID) 20 MG tablet, Take 1 tablet (20 mg total) by mouth daily., Disp: , Rfl:    hydrocortisone 1 % lotion, Apply 1 Application topically 2 (two) times daily., Disp: 118 mL, Rfl: 0   lidocaine-prilocaine (EMLA) cream, Apply to affected area once (Patient not taking: Reported on 01/28/2022), Disp: 30 g, Rfl: 3   LORazepam (ATIVAN) 0.5 MG tablet, Take 0.25-0.5 mg by mouth 2 (two) times daily as needed for anxiety., Disp: , Rfl:    nystatin (MYCOSTATIN) 100000 UNIT/ML suspension, Swish and swallow 5 mLs in the mouth or throat 4 times daily, Disp: 60 mL, Rfl: 0   ondansetron (ZOFRAN) 8 MG tablet, Take 1 tablet (8 mg total) by mouth every 8 (eight) hours as needed for nausea or vomiting. Start on the third day after chemotherapy. (Patient not taking: Reported on 01/28/2022), Disp: 30 tablet, Rfl: 1   oxyCODONE (OXY IR/ROXICODONE) 5 MG immediate release tablet, Take 1 tablet (5 mg total) by mouth every 6 (six) hours as needed for severe pain. (Patient not taking: Reported on 01/28/2022), Disp: 5 tablet, Rfl: 0   pegfilgrastim (NEULASTA) 6 MG/0.6ML injection, Inject 6 mg into the skin once.  Inject 0.6 mLs (6 mg total) into the skin once for 1 dose.  Administer at least 24hrs post end time of last chemotherapy drug., Disp: , Rfl:    prochlorperazine (COMPAZINE) 10 MG tablet, Take 1 tablet (10 mg total) by mouth every 6 (six) hours as needed for nausea or vomiting. (Patient not taking: Reported on 01/28/2022), Disp: 30 tablet, Rfl: 1   sertraline (ZOLOFT) 50 MG tablet, Take 50 mg by mouth daily., Disp: , Rfl:    TIROSINT 100 MCG  CAPS, Take 100 mcg by mouth every Sunday., Disp: , Rfl:    TIROSINT 88 MCG CAPS, Take 88 mcg by mouth See admin instructions. Take 88 mcg by mouth daily Monday-Saturday, Disp: , Rfl: 10   traMADol (ULTRAM) 50 MG tablet, Take 1 tablet (50 mg total) by mouth every 8 (eight) hours as needed., Disp: 20 tablet, Rfl: 0  PHYSICAL EXAM: ECOG FS:1 - Symptomatic but completely ambulatory    Vitals:   04/10/22 1347  BP:  126/75  Pulse: 98  Resp: 18  Temp: 98.7 F (37.1 C)  TempSrc: Oral  SpO2: 96%  Weight: 168 lb 3 oz (76.3 kg)   Physical Exam Vitals and nursing note reviewed.  Constitutional:      Appearance: She is well-developed. She is not ill-appearing or toxic-appearing.  HENT:     Head: Normocephalic.     Nose: Nose normal.     Mouth/Throat:     Comments: Thin white coating on sides of tongue.  Erythema and ulcers on roof of mouth. Eyes:     Conjunctiva/sclera: Conjunctivae normal.  Neck:     Vascular: No JVD.  Cardiovascular:     Rate and Rhythm: Normal rate and regular rhythm.     Pulses: Normal pulses.     Heart sounds: Normal heart sounds.  Pulmonary:     Effort: Pulmonary effort is normal.     Breath sounds: Normal breath sounds.  Abdominal:     General: There is no distension.  Musculoskeletal:     Cervical back: Normal range of motion.  Skin:    General: Skin is warm and dry.  Neurological:     Mental Status: She is oriented to person, place, and time.        LABORATORY DATA: I have reviewed the data as listed    Latest Ref Rng & Units 04/03/2022    8:23 AM 03/24/2022   12:27 PM 03/13/2022    9:39 AM  CBC  WBC 4.0 - 10.5 K/uL 19.1  1.7  13.7   Hemoglobin 12.0 - 15.0 g/dL 12.9  11.8  12.8   Hematocrit 36.0 - 46.0 % 39.1  35.6  38.5   Platelets 150 - 400 K/uL 333  267  575         Latest Ref Rng & Units 04/03/2022    8:23 AM 03/24/2022   12:27 PM 03/13/2022    9:39 AM  CMP  Glucose 70 - 99 mg/dL 208  132  172   BUN 8 - 23 mg/dL _0 Creatinine 0.44 - 1.00 mg/dL 0.61  0.60  0.56   Sodium 135 - 145 mmol/L 138  138  138   Potassium 3.5 - 5.1 mmol/L 3.8  3.6  3.7   Chloride 98 - 111 mmol/L 105  103  104   CO2 22 - 32 mmol/L _1 Calcium 8.9 - 10.3 mg/dL 9.5  9.4  9.7   Total Protein 6.5 - 8.1 g/dL 7.4  7.0  7.8   Total Bilirubin 0.3 - 1.2 mg/dL 0.3  0.4  0.4   Alkaline Phos 38 - 126 U/L 86  80  89   AST 15 - 41 U/L _2 ALT 0 - 44 U/L _3 RADIOGRAPHIC STUDIES (from last 24 hours if applicable) I have personally reviewed the radiological images as listed and agreed with the findings in the report. No results found.      Visit Diagnosis: 1. Malignant neoplasm of upper-outer quadrant of right breast in female, estrogen receptor negative ([place])   2. Mucositis      No orders of the defined types were placed in this encounter.   All questions were answered. The patient knows to call the clinic with any problems, questions or concerns. No barriers to learning was detected.  I have spent a total of  30 minutes minutes of face-to-face and non-face-to-face time, preparing to see the patient, obtaining and/or reviewing separately obtained history, performing a medically appropriate examination, counseling and educating the patient, ordering medication, documenting clinical information in the electronic health record, and care coordination (communications with other health care professionals or caregivers).    Thank you for allowing me to participate in the care of this patient.    Barrie Folk, PA-C Department of Hematology/Oncology [facility] Phone: [phone]  [phone]    04/10/2022 3:56 PM

## 2022-04-11 ENCOUNTER — Other Ambulatory Visit (HOSPITAL_COMMUNITY): Payer: Self-pay

## 2022-04-14 DIAGNOSIS — Z853 Personal history of malignant neoplasm of breast: Secondary | ICD-10-CM

## 2022-04-14 HISTORY — DX: Personal history of malignant neoplasm of breast: Z85.3

## 2022-04-15 ENCOUNTER — Encounter: Payer: Self-pay | Admitting: Physician Assistant

## 2022-04-15 ENCOUNTER — Encounter: Payer: Self-pay | Admitting: Hematology

## 2022-04-23 ENCOUNTER — Other Ambulatory Visit: Payer: Self-pay | Admitting: Physician Assistant

## 2022-04-23 ENCOUNTER — Other Ambulatory Visit: Payer: Self-pay | Admitting: Hematology

## 2022-04-23 ENCOUNTER — Encounter (HOSPITAL_BASED_OUTPATIENT_CLINIC_OR_DEPARTMENT_OTHER): Payer: Self-pay | Admitting: General Surgery

## 2022-04-23 MED FILL — Dexamethasone Sodium Phosphate Inj 100 MG/10ML: INTRAMUSCULAR | Qty: 1 | Status: AC

## 2022-04-23 NOTE — Progress Notes (Unsigned)
[place]   [phone] [phone]   Clinic Follow up Note   Patient Care Team: Crist Infante, MD as PCP - General (Internal Medicine) Mauro Kaufmann, RN as Oncology Nurse Navigator [name], RN as Oncology Nurse Navigator Stark Klein, MD as Consulting Physician (General Surgery) Truitt Merle, MD as Consulting Physician (Hematology) Kyung Rudd, MD as Consulting Physician (Radiation Oncology) Marcene Corning, MD as Consulting Physician (Endocrinology) Stefanie Libel, MD as Consulting Physician ([place]) Marylynn Pearson, MD as Consulting Physician (Obstetrics and Gynecology)  Date of Service:  04/24/2022  CHIEF COMPLAINT: f/u of  right breast cancer     CURRENT THERAPY: Adjuvant TC, q21d, starting 02/20/22      ASSESSMENT:  Samantha [name] is a 62 y.o. female with   Malignant neoplasm of upper-outer quadrant of right breast in female, estrogen receptor negative (Post)  IDC and DCIS, Stage IA, pT1b, cN0, triple negative, Grade 3 -found on screening mammogram. S/p lumpectomy on 01/08/22 with Dr. Barry Dienes, path showed: 8 mm invasive and in situ ductal carcinoma with negative margins. Repeat prognostic panel confirmed triple negative disease.  -lymph node biopsies 01/28/22 showed negative nodes (0/2). Port placed at time of procedure. Postoperative course complicated by pneumothorax, which has resolved. -she began adjuvant TC on 02/20/22, plan for 4 cycles. She had neutropenic fever after C2 wo GCSF (held due to sever bone pain), and skin rashes  -She tolerated cycle 3 TC moderate well with mucositis,no worsening rash  -Lab reviewed, adequate for treatment, will proceed cycle 4 TC today, her last cycle. -She will proceed adjuvant radiation after she recovers from chemo. -She would like to remove her port in the next few months.       PLAN: -Lab reviewed, will proceed with C4 TC at full dose with GCSF on day 3  -recommend Biotin Vitamins  for hair growth. -I refill magic mouth wash,nystatin  -referral to Dr. Lisbeth Renshaw for adjuvant RT -discuss port removal, will send a message to Dr. Barry Dienes -She will call me when she has her scheduled for adjuvant radiation, I will see her during her last week of radiation.    SUMMARY OF ONCOLOGIC HISTORY: Oncology History  Malignant neoplasm of upper-outer quadrant of right breast in female, estrogen receptor negative ([place])  12/06/2021 Mammogram   CLINICAL DATA:  Patient returns today to evaluate RIGHT breast calcifications identified on a recent screening mammogram.   EXAM: DIGITAL DIAGNOSTIC UNILATERAL RIGHT MAMMOGRAM  IMPRESSION: Grouped coarse heterogeneous calcifications within the upper-outer quadrant of the RIGHT breast, measuring 6 mm extent. These may be fibroadenomatous calcifications. Stereotactic biopsy is recommended to exclude malignancy.   12/18/2021 Initial Biopsy   Diagnosis Breast, right, needle core biopsy, upper outer quadrant, x clip - DUCTAL CARCINOMA IN SITU, SOLID AND CRIBRIFORM TYPE WITH COMEDONECROSIS AND ASSOCIATED CALCIFICATIONS, NUCLEAR GRADE 3 OF 3 - FOCAL MICROINVASION IS PRESENT (LESS THAN 1 MM) WITH EVIDENCE OF LYMPHOVASCULAR INVASION - NECROSIS: PRESENT - CALCIFICATIONS: PRESENT - DCIS LENGTH: 7 MM IN GREATEST LINEAR DIMENSION ON FRAGMENTED CORES  PROGNOSTIC INDICATORS Results: IMMUNOHISTOCHEMICAL AND MORPHOMETRIC ANALYSIS PERFORMED MANUALLY The tumor cells are NEGATIVE for Her2 (1+). Estrogen Receptor: 0%, NEGATIVE Progesterone Receptor: 0%, NEGATIVE Proliferation Marker Ki67: 60%   12/23/2021 Initial Diagnosis   Malignant neoplasm of upper-outer quadrant of right breast in female, estrogen receptor negative ([place])   12/23/2021 Imaging   EXAM: ULTRASOUND OF THE RIGHT AXILLA  IMPRESSION: No abnormal appearing RIGHT axillary lymph nodes.   01/08/2022 Cancer Staging  Staging form: Breast, AJCC 8th Edition - Pathologic stage from 01/08/2022: Stage  IB (pT1b, pN0, cM0, G3, ER-, PR-, HER2-) - Signed by Truitt Merle, MD on 01/24/2022 Stage prefix: Initial diagnosis Histologic grading system: 3 grade system Residual tumor (R): R0 - None   02/20/2022 -  Chemotherapy   Patient is on Treatment Plan : BREAST TC q21d        INTERVAL HISTORY:  Samantha [name] is here for a follow up of  right breast cancer    She was last seen by me on 04/03/22 She presents to the clinic alone. Pt states she had mouth soar but it has cleared up. She states last treatment went well besides having bumps on her arms. Pt wanted to know if her Beryle Flock was approve due to her insurances switching.       All other systems were reviewed with the patient and are negative.  MEDICAL HISTORY:  Past Medical History:  Diagnosis Date   Allergy    Anxiety 2015   Result of culmination of super stressful caregiving and deaths of 2 immediate family members.  Overall do pretty well.   Cancer [facility]) 2023   right breast   Depression    GERD (gastroesophageal reflux disease)    Hypothyroidism    Right carpal tunnel syndrome 09/19/2019   Thyroid disease     SURGICAL HISTORY: Past Surgical History:  Procedure Laterality Date   BREAST LUMPECTOMY WITH RADIOACTIVE SEED LOCALIZATION Right 01/08/2022   Procedure: RIGHT BREAST LUMPECTOMY WITH RADIOACTIVE SEED LOCALIZATION;  Surgeon: Stark Klein, MD;  Location: [place];  Service: General;  Laterality: Right;   PORTACATH PLACEMENT Left 01/28/2022   Procedure: INSERTION PORT-A-CATH;  Surgeon: Stark Klein, MD;  Location: [place];  Service: General;  Laterality: Left;   SENTINEL NODE BIOPSY Right 01/28/2022   Procedure: RIGHT SENTINEL LYMPH NODE BIOPSY;  Surgeon: Stark Klein, MD;  Location: [place];  Service: General;  Laterality: Right;   TONSILLECTOMY      I have reviewed the social history and family history with the patient and they are unchanged from previous note.  ALLERGIES:  is allergic  to nortriptyline, singulair [montelukast sodium], sulfonamide derivatives, amitriptyline hcl, esomeprazole, gabapentin, lexapro [escitalopram], and neosporin [bacitracin-polymyxin b].  MEDICATIONS:  Current Outpatient Medications  Medication Sig Dispense Refill   acetaminophen (TYLENOL) 500 MG tablet Take 1,000 mg by mouth every 6 (six) hours as needed for moderate pain.     Calcium Carb-Cholecalciferol (CALCIUM-VITAMIN D3) 250-125 MG-UNIT TABS Take 1 tablet once a day     Cholecalciferol (VITAMIN D3) 1000 units CAPS Take 1,000 Units by mouth daily.     dexamethasone (DECADRON) 4 MG tablet Take 2 tabs by mouth 2 times daily starting day before chemo. Then take 2 tabs daily for 2 days starting day after chemo. Take with food. (Patient not taking: Reported on 01/28/2022) 30 tablet 1   EPINEPHrine 0.3 mg/0.3 mL IJ SOAJ injection Inject 0.3 mg into the muscle as needed for anaphylaxis. 2 each 1   famotidine (PEPCID) 20 MG tablet Take 1 tablet (20 mg total) by mouth daily.     hydrocortisone 1 % lotion Apply 1 Application topically 2 (two) times daily. 118 mL 0   lidocaine-prilocaine (EMLA) cream Apply to affected area once (Patient not taking: Reported on 01/28/2022) 30 g 3   LORazepam (ATIVAN) 0.5 MG tablet Take 0.25-0.5 mg by mouth 2 (two) times daily as needed for anxiety.     magic  mouthwash (nystatin, diphenhydrAMINE, alum & mag hydroxide) suspension mixture Swish and spit 5 mLs 4 (four) times daily as needed for mouth pain. 240 mL 0   nystatin (MYCOSTATIN) 100000 UNIT/ML suspension Swish and swallow 5 mLs in the mouth or throat 4 times daily 60 mL 0   ondansetron (ZOFRAN) 8 MG tablet Take 1 tablet (8 mg total) by mouth every 8 (eight) hours as needed for nausea or vomiting. Start on the third day after chemotherapy. (Patient not taking: Reported on 01/28/2022) 30 tablet 1   oxyCODONE (OXY IR/ROXICODONE) 5 MG immediate release tablet Take 1 tablet (5 mg total) by mouth every 6 (six) hours as needed  for severe pain. (Patient not taking: Reported on 01/28/2022) 5 tablet 0   pegfilgrastim (NEULASTA) 6 MG/0.6ML injection Inject 6 mg into the skin once.  Inject 0.6 mLs (6 mg total) into the skin once for 1 dose.  Administer at least 24hrs post end time of last chemotherapy drug.     prochlorperazine (COMPAZINE) 10 MG tablet Take 1 tablet (10 mg total) by mouth every 6 (six) hours as needed for nausea or vomiting. (Patient not taking: Reported on 01/28/2022) 30 tablet 1   sertraline (ZOLOFT) 50 MG tablet Take 50 mg by mouth daily.     TIROSINT 100 MCG CAPS Take 100 mcg by mouth every Sunday.     TIROSINT 88 MCG CAPS Take 88 mcg by mouth See admin instructions. Take 88 mcg by mouth daily Monday-Saturday  10   traMADol (ULTRAM) 50 MG tablet Take 1 tablet (50 mg total) by mouth every 8 (eight) hours as needed. 20 tablet 0   No current facility-administered medications for this visit.   Facility-Administered Medications Ordered in Other Visits  Medication Dose Route Frequency Provider Last Rate Last Admin   sodium chloride flush (NS) 0.9 % injection 10 mL  10 mL Intracatheter PRN Truitt Merle, MD   10 mL at 04/24/22 1702    PHYSICAL EXAMINATION: ECOG PERFORMANCE STATUS: 1 - Symptomatic but completely ambulatory  Vitals:   04/24/22 1324  BP: 114/74  Pulse: 76  Resp: 18  Temp: 98.2 F (36.8 C)  SpO2: 98%   Wt Readings from Last 3 Encounters:  04/24/22 170 lb 11.2 oz (77.4 kg)  04/10/22 168 lb 3 oz (76.3 kg)  04/03/22 169 lb 12.8 oz (77 kg)     GENERAL:alert, no distress and comfortable SKIN: skin color normal, no rashes or significant lesions EYES: normal, Conjunctiva are pink and non-injected, sclera clear  NEURO: alert & oriented x 3 with fluent speech BREAST: RT breast incision clean.  LABORATORY DATA:  I have reviewed the data as listed    Latest Ref Rng & Units 04/24/2022    1:02 PM 04/03/2022    8:23 AM 03/24/2022   12:27 PM  CBC  WBC 4.0 - 10.5 K/uL 16.4  19.1  1.7    Hemoglobin 12.0 - 15.0 g/dL 12.1  12.9  11.8   Hematocrit 36.0 - 46.0 % 36.5  39.1  35.6   Platelets 150 - 400 K/uL 410  333  267         Latest Ref Rng & Units 04/24/2022    1:02 PM 04/03/2022    8:23 AM 03/24/2022   12:27 PM  CMP  Glucose 70 - 99 mg/dL 127  208  132   BUN 8 - 23 mg/dL '13  16  7   '$ Creatinine 0.44 - 1.00 mg/dL 0.47  0.61  0.60  Sodium 135 - 145 mmol/L 140  138  138   Potassium 3.5 - 5.1 mmol/L 3.8  3.8  3.6   Chloride 98 - 111 mmol/L 107  105  103   CO2 22 - 32 mmol/L '27  26  27   '$ Calcium 8.9 - 10.3 mg/dL 9.3  9.5  9.4   Total Protein 6.5 - 8.1 g/dL 6.9  7.4  7.0   Total Bilirubin 0.3 - 1.2 mg/dL 0.3  0.3  0.4   Alkaline Phos 38 - 126 U/L 78  86  80   AST 15 - 41 U/L '17  18  19   '$ ALT 0 - 44 U/L '19  17  22       '$ RADIOGRAPHIC STUDIES: I have personally reviewed the radiological images as listed and agreed with the findings in the report. No results found.    No orders of the defined types were placed in this encounter.  All questions were answered. The patient knows to call the clinic with any problems, questions or concerns. No barriers to learning was detected. The total time spent in the appointment was 30 minutes.     Truitt Merle, MD 04/24/2022   Felicity Coyer, CMA, am acting as scribe for Truitt Merle, MD.   I have reviewed the above documentation for accuracy and completeness, and I agree with the above.

## 2022-04-24 ENCOUNTER — Other Ambulatory Visit: Payer: Self-pay

## 2022-04-24 ENCOUNTER — Encounter: Payer: Self-pay | Admitting: *Deleted

## 2022-04-24 ENCOUNTER — Other Ambulatory Visit (HOSPITAL_COMMUNITY): Payer: Self-pay

## 2022-04-24 ENCOUNTER — Inpatient Hospital Stay: Payer: 59

## 2022-04-24 ENCOUNTER — Inpatient Hospital Stay: Payer: 59 | Attending: Hematology

## 2022-04-24 ENCOUNTER — Encounter: Payer: Self-pay | Admitting: Hematology

## 2022-04-24 ENCOUNTER — Inpatient Hospital Stay (HOSPITAL_BASED_OUTPATIENT_CLINIC_OR_DEPARTMENT_OTHER): Payer: 59 | Admitting: Hematology

## 2022-04-24 VITALS — BP 118/76 | HR 75 | Resp 17

## 2022-04-24 DIAGNOSIS — Z882 Allergy status to sulfonamides status: Secondary | ICD-10-CM | POA: Diagnosis not present

## 2022-04-24 DIAGNOSIS — Z171 Estrogen receptor negative status [ER-]: Secondary | ICD-10-CM

## 2022-04-24 DIAGNOSIS — Z833 Family history of diabetes mellitus: Secondary | ICD-10-CM | POA: Insufficient documentation

## 2022-04-24 DIAGNOSIS — C50411 Malignant neoplasm of upper-outer quadrant of right female breast: Secondary | ICD-10-CM | POA: Insufficient documentation

## 2022-04-24 DIAGNOSIS — R059 Cough, unspecified: Secondary | ICD-10-CM | POA: Diagnosis not present

## 2022-04-24 DIAGNOSIS — D72829 Elevated white blood cell count, unspecified: Secondary | ICD-10-CM | POA: Insufficient documentation

## 2022-04-24 DIAGNOSIS — Z95828 Presence of other vascular implants and grafts: Secondary | ICD-10-CM

## 2022-04-24 DIAGNOSIS — Z8349 Family history of other endocrine, nutritional and metabolic diseases: Secondary | ICD-10-CM | POA: Diagnosis not present

## 2022-04-24 DIAGNOSIS — E860 Dehydration: Secondary | ICD-10-CM | POA: Diagnosis not present

## 2022-04-24 DIAGNOSIS — Z888 Allergy status to other drugs, medicaments and biological substances status: Secondary | ICD-10-CM | POA: Diagnosis not present

## 2022-04-24 DIAGNOSIS — R000 Tachycardia, unspecified: Secondary | ICD-10-CM | POA: Insufficient documentation

## 2022-04-24 DIAGNOSIS — R0981 Nasal congestion: Secondary | ICD-10-CM | POA: Diagnosis not present

## 2022-04-24 DIAGNOSIS — Z5111 Encounter for antineoplastic chemotherapy: Secondary | ICD-10-CM | POA: Diagnosis present

## 2022-04-24 DIAGNOSIS — Z79899 Other long term (current) drug therapy: Secondary | ICD-10-CM | POA: Insufficient documentation

## 2022-04-24 DIAGNOSIS — R918 Other nonspecific abnormal finding of lung field: Secondary | ICD-10-CM | POA: Diagnosis not present

## 2022-04-24 LAB — CBC WITH DIFFERENTIAL (CANCER CENTER ONLY)
Abs Immature Granulocytes: 0.05 10*3/uL (ref 0.00–0.07)
Basophils Absolute: 0 10*3/uL (ref 0.0–0.1)
Basophils Relative: 0 %
Eosinophils Absolute: 0 10*3/uL (ref 0.0–0.5)
Eosinophils Relative: 0 %
HCT: 36.5 % (ref 36.0–46.0)
Hemoglobin: 12.1 g/dL (ref 12.0–15.0)
Immature Granulocytes: 0 %
Lymphocytes Relative: 7 %
Lymphs Abs: 1.2 10*3/uL (ref 0.7–4.0)
MCH: 30.7 pg (ref 26.0–34.0)
MCHC: 33.2 g/dL (ref 30.0–36.0)
MCV: 92.6 fL (ref 80.0–100.0)
Monocytes Absolute: 1.3 10*3/uL — ABNORMAL HIGH (ref 0.1–1.0)
Monocytes Relative: 8 %
Neutro Abs: 13.8 10*3/uL — ABNORMAL HIGH (ref 1.7–7.7)
Neutrophils Relative %: 85 %
Platelet Count: 410 10*3/uL — ABNORMAL HIGH (ref 150–400)
RBC: 3.94 MIL/uL (ref 3.87–5.11)
RDW: 15.9 % — ABNORMAL HIGH (ref 11.5–15.5)
WBC Count: 16.4 10*3/uL — ABNORMAL HIGH (ref 4.0–10.5)
nRBC: 0 % (ref 0.0–0.2)

## 2022-04-24 LAB — CMP (CANCER CENTER ONLY)
ALT: 19 U/L (ref 0–44)
AST: 17 U/L (ref 15–41)
Albumin: 4.2 g/dL (ref 3.5–5.0)
Alkaline Phosphatase: 78 U/L (ref 38–126)
Anion gap: 6 (ref 5–15)
BUN: 13 mg/dL (ref 8–23)
CO2: 27 mmol/L (ref 22–32)
Calcium: 9.3 mg/dL (ref 8.9–10.3)
Chloride: 107 mmol/L (ref 98–111)
Creatinine: 0.47 mg/dL (ref 0.44–1.00)
GFR, Estimated: >60 mL/min (ref >60)
Glucose, Bld: 127 mg/dL — ABNORMAL HIGH (ref 70–99)
Potassium: 3.8 mmol/L (ref 3.5–5.1)
Sodium: 140 mmol/L (ref 135–145)
Total Bilirubin: 0.3 mg/dL (ref 0.3–1.2)
Total Protein: 6.9 g/dL (ref 6.5–8.1)

## 2022-04-24 MED ORDER — NYSTATIN 100000 UNIT/ML MT SUSP: 5.0000 mL | Freq: Four times a day (QID) | OROMUCOSAL | 0 refills | Status: DC

## 2022-04-24 MED ORDER — SODIUM CHLORIDE 0.9% FLUSH
10.0000 mL | Freq: Once | INTRAVENOUS | Status: AC
  Administered 2022-04-24: 10 mL

## 2022-04-24 MED ORDER — SODIUM CHLORIDE 0.9 % IV SOLN
600.0000 mg/m2 | Freq: Once | INTRAVENOUS | Status: AC
  Administered 2022-04-24: 1140 mg via INTRAVENOUS
  Filled 2022-04-24: qty 57

## 2022-04-24 MED ORDER — NYSTATIN 100000 UNIT/ML MT SUSP
5.0000 mL | Freq: Four times a day (QID) | OROMUCOSAL | 0 refills | Status: DC | PRN
  Filled 2022-04-24: qty 240, 12d supply, fill #0

## 2022-04-24 MED ORDER — SODIUM CHLORIDE 0.9% FLUSH
10.0000 mL | INTRAVENOUS | Status: DC | PRN
  Administered 2022-04-24: 10 mL

## 2022-04-24 MED ORDER — FAMOTIDINE IN NACL 20-0.9 MG/50ML-% IV SOLN
20.0000 mg | Freq: Once | INTRAVENOUS | Status: AC
  Administered 2022-04-24: 20 mg via INTRAVENOUS
  Filled 2022-04-24: qty 50

## 2022-04-24 MED ORDER — HEPARIN SOD (PORK) LOCK FLUSH 100 UNIT/ML IV SOLN
500.0000 [IU] | Freq: Once | INTRAVENOUS | Status: AC | PRN
  Administered 2022-04-24: 500 [IU]

## 2022-04-24 MED ORDER — SODIUM CHLORIDE 0.9 % IV SOLN
10.0000 mg | Freq: Once | INTRAVENOUS | Status: AC
  Administered 2022-04-24: 10 mg via INTRAVENOUS
  Filled 2022-04-24: qty 10

## 2022-04-24 MED ORDER — PALONOSETRON HCL INJECTION 0.25 MG/5ML
0.2500 mg | Freq: Once | INTRAVENOUS | Status: AC
  Administered 2022-04-24: 0.25 mg via INTRAVENOUS
  Filled 2022-04-24: qty 5

## 2022-04-24 MED ORDER — SODIUM CHLORIDE 0.9 % IV SOLN
75.0000 mg/m2 | Freq: Once | INTRAVENOUS | Status: AC
  Administered 2022-04-24: 140 mg via INTRAVENOUS
  Filled 2022-04-24: qty 14

## 2022-04-24 MED ORDER — SODIUM CHLORIDE 0.9 % IV SOLN: Freq: Once | INTRAVENOUS | Status: AC

## 2022-04-25 ENCOUNTER — Encounter: Payer: Self-pay | Admitting: Hematology

## 2022-04-25 ENCOUNTER — Other Ambulatory Visit: Payer: Self-pay

## 2022-04-25 ENCOUNTER — Telehealth: Payer: Self-pay | Admitting: Hematology

## 2022-04-25 ENCOUNTER — Other Ambulatory Visit (HOSPITAL_COMMUNITY): Payer: Self-pay

## 2022-04-25 NOTE — Telephone Encounter (Signed)
I called pt back and informed that her pegfilgramstim was approved by her new insurance and refilled on 03/31/2022, prescription was called in to [facility]. I told pt to call [facility] and set up shipment.   Truitt Merle  04/25/2022

## 2022-04-26 ENCOUNTER — Other Ambulatory Visit: Payer: Self-pay

## 2022-05-01 ENCOUNTER — Encounter: Payer: Self-pay | Admitting: Hematology

## 2022-05-02 ENCOUNTER — Other Ambulatory Visit: Payer: Self-pay | Admitting: Hematology

## 2022-05-02 ENCOUNTER — Ambulatory Visit: Payer: Managed Care, Other (non HMO)

## 2022-05-02 ENCOUNTER — Encounter: Payer: Self-pay | Admitting: *Deleted

## 2022-05-02 ENCOUNTER — Other Ambulatory Visit: Payer: Self-pay

## 2022-05-02 ENCOUNTER — Inpatient Hospital Stay: Payer: 59

## 2022-05-02 VITALS — BP 130/81 | HR 79 | Resp 17

## 2022-05-02 DIAGNOSIS — Z95828 Presence of other vascular implants and grafts: Secondary | ICD-10-CM

## 2022-05-02 DIAGNOSIS — C50411 Malignant neoplasm of upper-outer quadrant of right female breast: Secondary | ICD-10-CM

## 2022-05-02 DIAGNOSIS — Z5111 Encounter for antineoplastic chemotherapy: Secondary | ICD-10-CM | POA: Diagnosis not present

## 2022-05-02 MED ORDER — PEGFILGRASTIM INJECTION 6 MG/0.6ML ~~LOC~~
6.0000 mg | PREFILLED_SYRINGE | Freq: Once | SUBCUTANEOUS | Status: AC
  Administered 2022-05-02: 6 mg via SUBCUTANEOUS
  Filled 2022-05-02: qty 0.6

## 2022-05-05 ENCOUNTER — Inpatient Hospital Stay (HOSPITAL_BASED_OUTPATIENT_CLINIC_OR_DEPARTMENT_OTHER): Payer: 59 | Admitting: Physician Assistant

## 2022-05-05 ENCOUNTER — Telehealth: Payer: Self-pay

## 2022-05-05 ENCOUNTER — Encounter: Payer: Self-pay | Admitting: Hematology

## 2022-05-05 ENCOUNTER — Other Ambulatory Visit: Payer: Self-pay

## 2022-05-05 ENCOUNTER — Inpatient Hospital Stay: Payer: 59

## 2022-05-05 ENCOUNTER — Ambulatory Visit (HOSPITAL_COMMUNITY)
Admission: RE | Admit: 2022-05-05 | Discharge: 2022-05-05 | Disposition: A | Discharge status: Home / Self Care | Payer: 59 | Source: Ambulatory Visit | Attending: Physician Assistant | Admitting: Physician Assistant

## 2022-05-05 VITALS — BP 111/61 | HR 107 | Temp 98.7°F | Resp 17 | Wt 176.5 lb

## 2022-05-05 DIAGNOSIS — C50411 Malignant neoplasm of upper-outer quadrant of right female breast: Secondary | ICD-10-CM | POA: Diagnosis not present

## 2022-05-05 DIAGNOSIS — Z171 Estrogen receptor negative status [ER-]: Secondary | ICD-10-CM

## 2022-05-05 DIAGNOSIS — R059 Cough, unspecified: Secondary | ICD-10-CM | POA: Insufficient documentation

## 2022-05-05 DIAGNOSIS — Z95828 Presence of other vascular implants and grafts: Secondary | ICD-10-CM | POA: Diagnosis not present

## 2022-05-05 DIAGNOSIS — Z5111 Encounter for antineoplastic chemotherapy: Secondary | ICD-10-CM | POA: Diagnosis not present

## 2022-05-05 LAB — CMP (CANCER CENTER ONLY)
ALT: 22 U/L (ref 0–44)
AST: 27 U/L (ref 15–41)
Albumin: 3.5 g/dL (ref 3.5–5.0)
Alkaline Phosphatase: 193 U/L — ABNORMAL HIGH (ref 38–126)
Anion gap: 7 (ref 5–15)
BUN: 8 mg/dL (ref 8–23)
CO2: 27 mmol/L (ref 22–32)
Calcium: 9 mg/dL (ref 8.9–10.3)
Chloride: 104 mmol/L (ref 98–111)
Creatinine: 0.7 mg/dL (ref 0.44–1.00)
GFR, Estimated: >60 mL/min (ref >60)
Glucose, Bld: 103 mg/dL — ABNORMAL HIGH (ref 70–99)
Potassium: 3.6 mmol/L (ref 3.5–5.1)
Sodium: 138 mmol/L (ref 135–145)
Total Bilirubin: 0.4 mg/dL (ref 0.3–1.2)
Total Protein: 5.9 g/dL — ABNORMAL LOW (ref 6.5–8.1)

## 2022-05-05 LAB — CBC WITH DIFFERENTIAL (CANCER CENTER ONLY)
Abs Immature Granulocytes: 11.97 10*3/uL — ABNORMAL HIGH (ref 0.00–0.07)
Basophils Absolute: 0.1 10*3/uL (ref 0.0–0.1)
Basophils Relative: 0 %
Eosinophils Absolute: 0 10*3/uL (ref 0.0–0.5)
Eosinophils Relative: 0 %
HCT: 31.3 % — ABNORMAL LOW (ref 36.0–46.0)
Hemoglobin: 10.3 g/dL — ABNORMAL LOW (ref 12.0–15.0)
Immature Granulocytes: 17 %
Lymphocytes Relative: 6 %
Lymphs Abs: 4.1 10*3/uL — ABNORMAL HIGH (ref 0.7–4.0)
MCH: 30.7 pg (ref 26.0–34.0)
MCHC: 32.9 g/dL (ref 30.0–36.0)
MCV: 93.2 fL (ref 80.0–100.0)
Monocytes Absolute: 7.3 10*3/uL — ABNORMAL HIGH (ref 0.1–1.0)
Monocytes Relative: 11 %
Neutro Abs: 45.3 10*3/uL — ABNORMAL HIGH (ref 1.7–7.7)
Neutrophils Relative %: 66 %
Platelet Count: 273 10*3/uL (ref 150–400)
RBC: 3.36 MIL/uL — ABNORMAL LOW (ref 3.87–5.11)
RDW: 16.3 % — ABNORMAL HIGH (ref 11.5–15.5)
Smear Review: NORMAL
WBC Count: 68.8 10*3/uL (ref 4.0–10.5)
nRBC: 0.3 % — ABNORMAL HIGH (ref 0.0–0.2)

## 2022-05-05 MED ORDER — SODIUM CHLORIDE 0.9% FLUSH
10.0000 mL | Freq: Once | INTRAVENOUS | Status: AC
  Administered 2022-05-05: 10 mL

## 2022-05-05 MED ORDER — HEPARIN SOD (PORK) LOCK FLUSH 100 UNIT/ML IV SOLN
500.0000 [IU] | Freq: Once | INTRAVENOUS | Status: AC
  Administered 2022-05-05: 500 [IU]

## 2022-05-05 MED ORDER — SODIUM CHLORIDE 0.9 % IV SOLN: Freq: Once | INTRAVENOUS | Status: AC

## 2022-05-05 MED ORDER — SODIUM CHLORIDE 0.9 % IV SOLN: Freq: Once | INTRAVENOUS | Status: DC

## 2022-05-05 MED ORDER — BENZONATATE 100 MG PO CAPS: 200.0000 mg | ORAL_CAPSULE | Freq: Three times a day (TID) | ORAL | 0 refills | Status: DC | PRN

## 2022-05-05 NOTE — Telephone Encounter (Signed)
RN returned patient's voicemail regarding cough and feeling dehydrated. Per patient, she has had a "nagging cough" since last Monday, 04/28/22 and had a decreased appetite yesterday. Patient states she feels dehydrated and would like IV fluids today. Denies any sick contacts, fevers, diarrhea and vomiting. States she took a Covid test yesterday which was negative. [facility] appointment made for 1p today, patient verbalized understanding of time and location of appointment.

## 2022-05-05 NOTE — Progress Notes (Signed)
Error

## 2022-05-05 NOTE — Progress Notes (Signed)
Symptom Management Consult note [facility]    Patient Care Team: Crist Infante, MD as PCP - General (Internal Medicine) Mauro Kaufmann, RN as Oncology Nurse Navigator [name], RN as Oncology Nurse Navigator Stark Klein, MD as Consulting Physician (General Surgery) Truitt Merle, MD as Consulting Physician (Hematology) Kyung Rudd, MD as Consulting Physician (Radiation Oncology) Marcene Corning, MD as Consulting Physician (Endocrinology) Stefanie Libel, MD as Consulting Physician (Sports Medicine) Marylynn Pearson, MD as Consulting Physician (Obstetrics and Gynecology)    Name of the patient: Samantha [name]  [id number]  1960/12/16   Date of visit: 05/05/2022   Chief Complaint/Reason for visit: dehydration, cough   Current Therapy: Cytoxan, taxotere with pegfilgrastim  Last treatment:  Day 1   Cycle 4 on 04/24/22. GCSF on 05/02/22   ASSESSMENT & PLAN: Patient is a 62 y.o. female  with oncologic history of triple negative breast cancer followed by Dr. Burr Medico.  I have viewed most recent oncology note and lab work.    #Triple negative breast cancer -Finished chemotherapy recently -Plan to start radiation once recovered from chemotherapy   #Leukocytosis -WBC today is 68.8 with ANC 45.3. Patient had pegfilgrastim x 4 days ago.  In the absence of fever leukocytosis is likely related to GCSF. -Encouraged patient to continue to monitor temperature at home.  #Decreased PO intake -Clinically appears dehydrated. Has had decreased appetite and PO intake over last few days. -CMP without significant electrolyte derangement, no renal insufficiency. -Tachycardia improved after IVF.    #Symptom management: Cough, eye drainage -Clear lung exam, normal work of breathing, no hypoxia. Chest xray obtained after clinic visit. I viewed image and no acute infectious process seen. Radiologist comments on chronic bronchitic changes. -Discussed results with patient. No  indication for antibiotic treatment at this time. Tessalon perles prescribed and discussed OTC symptomatic treatment. Offered prescription for Tussionex given cough is disrupting sleep, patient prefers to hold off on narcotics at this time. -Eye exam without conjunctivitis. Advised patient not to wear contacts until symptoms resolve and see ophthalmologist if needed. -Dr. Burr Medico agreeable with plan.  Strict ED precautions discussed should symptoms worsen.   Heme/Onc History: Oncology History  Malignant neoplasm of upper-outer quadrant of right breast in female, estrogen receptor negative ([place])  12/06/2021 Mammogram   CLINICAL DATA:  Patient returns today to evaluate RIGHT breast calcifications identified on a recent screening mammogram.   EXAM: DIGITAL DIAGNOSTIC UNILATERAL RIGHT MAMMOGRAM  IMPRESSION: Grouped coarse heterogeneous calcifications within the upper-outer quadrant of the RIGHT breast, measuring 6 mm extent. These may be fibroadenomatous calcifications. Stereotactic biopsy is recommended to exclude malignancy.   12/18/2021 Initial Biopsy   Diagnosis Breast, right, needle core biopsy, upper outer quadrant, x clip - DUCTAL CARCINOMA IN SITU, SOLID AND CRIBRIFORM TYPE WITH COMEDONECROSIS AND ASSOCIATED CALCIFICATIONS, NUCLEAR GRADE 3 OF 3 - FOCAL MICROINVASION IS PRESENT (LESS THAN 1 MM) WITH EVIDENCE OF LYMPHOVASCULAR INVASION - NECROSIS: PRESENT - CALCIFICATIONS: PRESENT - DCIS LENGTH: 7 MM IN GREATEST LINEAR DIMENSION ON FRAGMENTED CORES  PROGNOSTIC INDICATORS Results: IMMUNOHISTOCHEMICAL AND MORPHOMETRIC ANALYSIS PERFORMED MANUALLY The tumor cells are NEGATIVE for Her2 (1+). Estrogen Receptor: 0%, NEGATIVE Progesterone Receptor: 0%, NEGATIVE Proliferation Marker Ki67: 60%   12/23/2021 Initial Diagnosis   Malignant neoplasm of upper-outer quadrant of right breast in female, estrogen receptor negative ([place])   12/23/2021 Imaging   EXAM: ULTRASOUND OF THE RIGHT  AXILLA  IMPRESSION: No abnormal appearing RIGHT axillary lymph nodes.   01/08/2022 Cancer Staging  Staging form: Breast, AJCC 8th Edition - Pathologic stage from 01/08/2022: Stage IB (pT1b, pN0, cM0, G3, ER-, PR-, HER2-) - Signed by Truitt Merle, MD on 01/24/2022 Stage prefix: Initial diagnosis Histologic grading system: 3 grade system Residual tumor (R): R0 - None   02/20/2022 - 04/24/2022 Chemotherapy   Patient is on Treatment Plan : BREAST TC q21d         Interval history-: Samantha [name] is a 62 y.o. female with oncologic history as above presenting to [facility] today with chief complaint of cough and dehydration. She presents unaccompanied to clinic today.  Patient reports nonproductive cough x 1 week. It has been keeping her awake at night. She has tried mucinex and delsym without much improvement. She has tried drinking warm tea and gargling salt water. She adds that this morning when she woke up her eyes had clear crusting she had to wipe away. She does wear contacts although has not worn them in over one week, has been wearing glasses only. She denies any sinus pressure. Did notice nasal congestion starting today. Took covid test that was negative. She has not had any wheezing, fevers or chills. No sick contacts. She is currently taking Claritin.  She is also endorsing dehydration with lack of fluids over the last several days. This is typical for her post chemo symptoms. She estimates drinking oz of fluid yesterday. Denies any abdominal pain, nausea, urinary symptoms.       ROS  All other systems are reviewed and are negative for acute change except as noted in the HPI.    Allergies  Allergen Reactions   Nortriptyline Other (See Comments)    depression   Singulair [Montelukast Sodium] Other (See Comments)    Depressed mood   Sulfonamide Derivatives Nausea And Vomiting   Amitriptyline Hcl     Depression mood   Esomeprazole Hives   Gabapentin     Hair Loss   Lexapro  [Escitalopram]     Depression mood   Neosporin [Bacitracin-Polymyxin B] Rash     Past Medical History:  Diagnosis Date   Allergy    Anxiety 2015   Result of culmination of super stressful caregiving and deaths of 2 immediate family members.  Overall do pretty well.   Cancer [facility]) 2023   right breast   Depression    GERD (gastroesophageal reflux disease)    Hypothyroidism    Right carpal tunnel syndrome 09/19/2019   Thyroid disease      Past Surgical History:  Procedure Laterality Date   BREAST LUMPECTOMY WITH RADIOACTIVE SEED LOCALIZATION Right 01/08/2022   Procedure: RIGHT BREAST LUMPECTOMY WITH RADIOACTIVE SEED LOCALIZATION;  Surgeon: Stark Klein, MD;  Location: [place];  Service: General;  Laterality: Right;   PORTACATH PLACEMENT Left 01/28/2022   Procedure: INSERTION PORT-A-CATH;  Surgeon: Stark Klein, MD;  Location: [place];  Service: General;  Laterality: Left;   SENTINEL NODE BIOPSY Right 01/28/2022   Procedure: RIGHT SENTINEL LYMPH NODE BIOPSY;  Surgeon: Stark Klein, MD;  Location: [place];  Service: General;  Laterality: Right;   TONSILLECTOMY      Social History   Socioeconomic History   Marital status: Married    Spouse name: Not on file   Number of children: 1   Years of education: Not on file   Highest education level: Not on file  Occupational History   Not on file  Tobacco Use   Smoking status: Never   Smokeless tobacco: Never  Vaping Use  Vaping Use: Never used  Substance and Sexual Activity   Alcohol use: Yes    Comment: maybe 1 drink 4 times a year   Drug use: No   Sexual activity: Not Currently    Birth control/protection: Post-menopausal  Other Topics Concern   Not on file  Social History Narrative   Not on file   Social Determinants of Health   Financial Resource Strain: Not on file  Food Insecurity: Not on file  Transportation Needs: Not on file  Physical Activity: Not on file  Stress: Not on  file  Social Connections: Not on file  Intimate Partner Violence: Not on file    Family History  Problem Relation Age of Onset   Hyperlipidemia Father    Diabetes Maternal Grandmother      Current Outpatient Medications:    benzonatate (TESSALON) 100 MG capsule, Take 2 capsules (200 mg total) by mouth 3 (three) times daily as needed for cough., Disp: 20 capsule, Rfl: 0   acetaminophen (TYLENOL) 500 MG tablet, Take 1,000 mg by mouth every 6 (six) hours as needed for moderate pain., Disp: , Rfl:    Calcium Carb-Cholecalciferol (CALCIUM-VITAMIN D3) 250-125 MG-UNIT TABS, Take 1 tablet once a day, Disp: , Rfl:    Cholecalciferol (VITAMIN D3) 1000 units CAPS, Take 1,000 Units by mouth daily., Disp: , Rfl:    EPINEPHrine 0.3 mg/0.3 mL IJ SOAJ injection, Inject 0.3 mg into the muscle as needed for anaphylaxis., Disp: 2 each, Rfl: 1   famotidine (PEPCID) 20 MG tablet, Take 1 tablet (20 mg total) by mouth daily., Disp: , Rfl:    hydrocortisone 1 % lotion, Apply 1 Application topically 2 (two) times daily., Disp: 118 mL, Rfl: 0   LORazepam (ATIVAN) 0.5 MG tablet, Take 0.25-0.5 mg by mouth 2 (two) times daily as needed for anxiety., Disp: , Rfl:    magic mouthwash (nystatin, diphenhydrAMINE, alum & mag hydroxide) suspension mixture, Swish and spit 5 mLs 4 (four) times daily as needed for mouth pain., Disp: 240 mL, Rfl: 0   nystatin (MYCOSTATIN) 100000 UNIT/ML suspension, Swish and swallow 5 mLs in the mouth or throat 4 times daily, Disp: 60 mL, Rfl: 0   oxyCODONE (OXY IR/ROXICODONE) 5 MG immediate release tablet, Take 1 tablet (5 mg total) by mouth every 6 (six) hours as needed for severe pain. (Patient not taking: Reported on 01/28/2022), Disp: 5 tablet, Rfl: 0   pegfilgrastim (NEULASTA) 6 MG/0.6ML injection, Inject 6 mg into the skin once.  Inject 0.6 mLs (6 mg total) into the skin once for 1 dose.  Administer at least 24hrs post end time of last chemotherapy drug., Disp: , Rfl:    sertraline (ZOLOFT)  50 MG tablet, Take 50 mg by mouth daily., Disp: , Rfl:    TIROSINT 100 MCG CAPS, Take 100 mcg by mouth every Sunday., Disp: , Rfl:    TIROSINT 88 MCG CAPS, Take 88 mcg by mouth See admin instructions. Take 88 mcg by mouth daily Monday-Saturday, Disp: , Rfl: 10   traMADol (ULTRAM) 50 MG tablet, Take 1 tablet (50 mg total) by mouth every 8 (eight) hours as needed., Disp: 20 tablet, Rfl: 0  PHYSICAL EXAM: ECOG FS:1 - Symptomatic but completely ambulatory    Vitals:   05/05/22 1325 05/05/22 1450  BP: 111/61   Pulse: (!) 117 (!) 107  Resp: 17   Temp: 98.7 F (37.1 C)   TempSrc: Oral   SpO2: 96% 99%  Weight: 176 lb 8 oz (80.1 kg)  Physical Exam Vitals and nursing note reviewed.  Constitutional:      Appearance: She is well-developed. She is not ill-appearing or toxic-appearing.  HENT:     Head: Normocephalic.     Comments: No sinus or temporal tenderness.    Nose: Nose normal.     Mouth/Throat:     Mouth: Mucous membranes are dry.  Eyes:     Conjunctiva/sclera: Conjunctivae normal.  Neck:     Vascular: No JVD.  Cardiovascular:     Rate and Rhythm: Regular rhythm. Tachycardia present.     Pulses: Normal pulses.     Heart sounds: Normal heart sounds.  Pulmonary:     Effort: Pulmonary effort is normal. No respiratory distress.     Breath sounds: Normal breath sounds. No stridor. No wheezing, rhonchi or rales.  Abdominal:     General: There is no distension.  Musculoskeletal:     Cervical back: Normal range of motion.  Skin:    General: Skin is warm and dry.  Neurological:     Mental Status: She is oriented to person, place, and time.        LABORATORY DATA: I have reviewed the data as listed    Latest Ref Rng & Units 05/05/2022    1:42 PM 04/24/2022    1:02 PM 04/03/2022    8:23 AM  CBC  WBC 4.0 - 10.5 K/uL 68.8  16.4  19.1   Hemoglobin 12.0 - 15.0 g/dL 10.3  12.1  12.9   Hematocrit 36.0 - 46.0 % 31.3  36.5  39.1   Platelets 150 - 400 K/uL 273  410  333          Latest Ref Rng & Units 05/05/2022    1:42 PM 04/24/2022    1:02 PM 04/03/2022    8:23 AM  CMP  Glucose 70 - 99 mg/dL 103  127  208   BUN 8 - 23 mg/dL '8  13  16   '$ Creatinine 0.44 - 1.00 mg/dL 0.70  0.47  0.61   Sodium 135 - 145 mmol/L 138  140  138   Potassium 3.5 - 5.1 mmol/L 3.6  3.8  3.8   Chloride 98 - 111 mmol/L 104  107  105   CO2 22 - 32 mmol/L '27  27  26   '$ Calcium 8.9 - 10.3 mg/dL 9.0  9.3  9.5   Total Protein 6.5 - 8.1 g/dL 5.9  6.9  7.4   Total Bilirubin 0.3 - 1.2 mg/dL 0.4  0.3  0.3   Alkaline Phos 38 - 126 U/L 193  78  86   AST 15 - 41 U/L '27  17  18   '$ ALT 0 - 44 U/L '22  19  17        '$ RADIOGRAPHIC STUDIES (from last 24 hours if applicable) I have personally reviewed the radiological images as listed and agreed with the findings in the report. DG Chest 2 View  Result Date: 05/05/2022 CLINICAL DATA:  Cough EXAM: CHEST - 2 VIEW COMPARISON:  723 FINDINGS: LEFT subclavian Port-A-Cath with tip projecting over SVC. Normal heart size, mediastinal contours, and pulmonary vascularity. Peribronchial thickening with minimal chronic blunting of the costophrenic angles. No acute infiltrate, pleural effusion, or pneumothorax. Surgical clips RIGHT breast and RIGHT axilla. No acute osseous findings. IMPRESSION: Chronic bronchitic changes with minimal chronic blunting of the costophrenic angles. No acute abnormalities. Electronically Signed   By: Lavonia Dana M.D.   On: 05/05/2022 16:49  Visit Diagnosis: 1. Cough, unspecified type   2. Malignant neoplasm of upper-outer quadrant of right breast in female, estrogen receptor negative ([place])   3. Port-A-Cath in place      Orders Placed This Encounter  Procedures   DG Chest 2 View    Standing Status:   Future    Number of Occurrences:   1    Standing Expiration Date:   05/05/2023    Order Specific Question:   Reason for Exam (SYMPTOM  OR DIAGNOSIS REQUIRED)    Answer:   Cough    Order Specific Question:   Preferred  imaging location?    Answer:   [facility]    All questions were answered. The patient knows to call the clinic with any problems, questions or concerns. No barriers to learning was detected.  I have spent a total of 30 minutes minutes of face-to-face and non-face-to-face time, preparing to see the patient, obtaining and/or reviewing separately obtained history, performing a medically appropriate examination, counseling and educating the patient, ordering tests, documenting clinical information in the electronic health record, and care coordination (communications with other health care professionals or caregivers).    Thank you for allowing me to participate in the care of this patient.    Barrie Folk, PA-C Department of Hematology/Oncology [facility] at [facility] Phone: [phone]  [phone]    05/05/2022 9:32 PM

## 2022-05-05 NOTE — Telephone Encounter (Signed)
CRITICAL VALUE STICKER  CRITICAL VALUE: WBC 68.8  RECEIVER (on-site recipient of call): Maurine Simmering CMA  DATE & TIME NOTIFIED: 05/05/2022 1355  MESSENGER (representative from lab): Roslyn Smiling  MD NOTIFIED: Dr. Burr Medico  TIME OF NOTIFICATION: 1402  RESPONSE:  Notified physician

## 2022-05-05 NOTE — Progress Notes (Signed)
Orders entered form [facility] visit today.

## 2022-05-06 ENCOUNTER — Telehealth: Payer: Self-pay | Admitting: Physician Assistant

## 2022-05-06 ENCOUNTER — Encounter: Payer: Self-pay | Admitting: Physician Assistant

## 2022-05-06 ENCOUNTER — Other Ambulatory Visit: Payer: Self-pay | Admitting: General Surgery

## 2022-05-06 ENCOUNTER — Inpatient Hospital Stay (HOSPITAL_BASED_OUTPATIENT_CLINIC_OR_DEPARTMENT_OTHER): Payer: 59 | Admitting: Physician Assistant

## 2022-05-06 ENCOUNTER — Inpatient Hospital Stay: Payer: 59 | Admitting: Physician Assistant

## 2022-05-06 DIAGNOSIS — R059 Cough, unspecified: Secondary | ICD-10-CM | POA: Diagnosis not present

## 2022-05-06 DIAGNOSIS — C50411 Malignant neoplasm of upper-outer quadrant of right female breast: Secondary | ICD-10-CM | POA: Diagnosis not present

## 2022-05-06 DIAGNOSIS — Z171 Estrogen receptor negative status [ER-]: Secondary | ICD-10-CM | POA: Diagnosis not present

## 2022-05-06 MED ORDER — HYDROCOD POLI-CHLORPHE POLI ER 10-8 MG/5ML PO SUER: 5.0000 mL | Freq: Two times a day (BID) | ORAL | 0 refills | Status: AC | PRN

## 2022-05-06 NOTE — Telephone Encounter (Signed)
I connected with Samantha [name] on '@TODAY'$ @ at  by telephone and verified that I am speaking with the correct person using two identifiers.   I discussed the limitations, risks, security and privacy concerns of performing an evaluation and management service by telemedicine and the availability of in-person appointments. I also discussed with the patient that there may be a patient responsible charge related to this service. The patient expressed understanding and agreed to proceed.   Other persons participating in the visit and their role in the encounter: ***   Patient's location: ***  Provider's location: ***       Symptom Management Consult note [place]    Patient Care Team: Crist Infante, MD as PCP - General (Internal Medicine) Mauro Kaufmann, RN as Oncology Nurse Navigator [name], RN as Oncology Nurse Navigator Stark Klein, MD as Consulting Physician (General Surgery) Truitt Merle, MD as Consulting Physician (Hematology) Kyung Rudd, MD as Consulting Physician (Radiation Oncology) Marcene Corning, MD as Consulting Physician (Endocrinology) Stefanie Libel, MD as Consulting Physician ([place]) Marylynn Pearson, MD as Consulting Physician (Obstetrics and Gynecology)    Name of the patient: Samantha [name]  [id number]  [birth year]   Date of visit: '@ENCDATE'$ @    Chief complaint/ Reason for visit- ***  Oncology History  Malignant neoplasm of upper-outer quadrant of right breast in female, estrogen receptor negative ([place])  12/06/2021 Mammogram   CLINICAL DATA:  Patient returns today to evaluate RIGHT breast calcifications identified on a recent screening mammogram.   EXAM: DIGITAL DIAGNOSTIC UNILATERAL RIGHT MAMMOGRAM  IMPRESSION: Grouped coarse heterogeneous calcifications within the upper-outer quadrant of the RIGHT breast, measuring 6 mm extent. These may be fibroadenomatous calcifications. Stereotactic biopsy is recommended to exclude  malignancy.   12/18/2021 Initial Biopsy   Diagnosis Breast, right, needle core biopsy, upper outer quadrant, x clip - DUCTAL CARCINOMA IN SITU, SOLID AND CRIBRIFORM TYPE WITH COMEDONECROSIS AND ASSOCIATED CALCIFICATIONS, NUCLEAR GRADE 3 OF 3 - FOCAL MICROINVASION IS PRESENT (LESS THAN 1 MM) WITH EVIDENCE OF LYMPHOVASCULAR INVASION - NECROSIS: PRESENT - CALCIFICATIONS: PRESENT - DCIS LENGTH: 7 MM IN GREATEST LINEAR DIMENSION ON FRAGMENTED CORES  PROGNOSTIC INDICATORS Results: IMMUNOHISTOCHEMICAL AND MORPHOMETRIC ANALYSIS PERFORMED MANUALLY The tumor cells are NEGATIVE for Her2 (1+). Estrogen Receptor: 0%, NEGATIVE Progesterone Receptor: 0%, NEGATIVE Proliferation Marker Ki67: 60%   12/23/2021 Initial Diagnosis   Malignant neoplasm of upper-outer quadrant of right breast in female, estrogen receptor negative ([place])   12/23/2021 Imaging   EXAM: ULTRASOUND OF THE RIGHT AXILLA  IMPRESSION: No abnormal appearing RIGHT axillary lymph nodes.   01/08/2022 Cancer Staging   Staging form: Breast, AJCC 8th Edition - Pathologic stage from 01/08/2022: Stage IB (pT1b, pN0, cM0, G3, ER-, PR-, HER2-) - Signed by Truitt Merle, MD on 01/24/2022 Stage prefix: Initial diagnosis Histologic grading system: 3 grade system Residual tumor (R): R0 - None   02/20/2022 - 04/24/2022 Chemotherapy   Patient is on Treatment Plan : BREAST TC q21d       Current Therapy: ***  Interval history- ***   Denies any neurologic complaints. Denies recent fevers or illnesses. Denies any easy bleeding or bruising. Reports good appetite and denies weight loss. Denies chest pain. Denies any nausea, vomiting, constipation, or diarrhea. Denies urinary complaints. Patient offers no further specific complaints today.    '@ROS'$ @     Allergies  Allergen Reactions   Nortriptyline Other (See Comments)    depression   Singulair [Montelukast Sodium] Other (See Comments)  Depressed mood   Sulfonamide Derivatives Nausea And  Vomiting   Amitriptyline Hcl     Depression mood   Esomeprazole Hives   Gabapentin     Hair Loss   Lexapro [Escitalopram]     Depression mood   Neosporin [Bacitracin-Polymyxin B] Rash     Past Medical History:  Diagnosis Date   Allergy    Anxiety 2015   Result of culmination of super stressful caregiving and deaths of 2 immediate family members.  Overall do pretty well.   Cancer ([place]) 2023   right breast   Depression    GERD (gastroesophageal reflux disease)    Hypothyroidism    Right carpal tunnel syndrome 09/19/2019   Thyroid disease      Past Surgical History:  Procedure Laterality Date   BREAST LUMPECTOMY WITH RADIOACTIVE SEED LOCALIZATION Right 01/08/2022   Procedure: RIGHT BREAST LUMPECTOMY WITH RADIOACTIVE SEED LOCALIZATION;  Surgeon: Stark Klein, MD;  Location: [facility];  Service: General;  Laterality: Right;   PORTACATH PLACEMENT Left 01/28/2022   Procedure: INSERTION PORT-A-CATH;  Surgeon: Stark Klein, MD;  Location: [facility];  Service: General;  Laterality: Left;   SENTINEL NODE BIOPSY Right 01/28/2022   Procedure: RIGHT SENTINEL LYMPH NODE BIOPSY;  Surgeon: Stark Klein, MD;  Location: [facility];  Service: General;  Laterality: Right;   TONSILLECTOMY      Social History   Socioeconomic History   Marital status: Married    Spouse name: Not on file   Number of children: 1   Years of education: Not on file   Highest education level: Not on file  Occupational History   Not on file  Tobacco Use   Smoking status: Never   Smokeless tobacco: Never  Vaping Use   Vaping Use: Never used  Substance and Sexual Activity   Alcohol use: Yes    Comment: maybe 1 drink 4 times a year   Drug use: No   Sexual activity: Not Currently    Birth control/protection: Post-menopausal  Other Topics Concern   Not on file  Social History Narrative   Not on file   Social Determinants of Health   Financial Resource Strain: Not on file   Food Insecurity: Not on file  Transportation Needs: Not on file  Physical Activity: Not on file  Stress: Not on file  Social Connections: Not on file  Intimate Partner Violence: Not on file    Family History  Problem Relation Age of Onset   Hyperlipidemia Father    Diabetes Maternal Grandmother      Current Outpatient Medications:    acetaminophen (TYLENOL) 500 MG tablet, Take 1,000 mg by mouth every 6 (six) hours as needed for moderate pain., Disp: , Rfl:    benzonatate (TESSALON) 100 MG capsule, Take 2 capsules (200 mg total) by mouth 3 (three) times daily as needed for cough., Disp: 20 capsule, Rfl: 0   Calcium Carb-Cholecalciferol (CALCIUM-VITAMIN D3) 250-125 MG-UNIT TABS, Take 1 tablet once a day, Disp: , Rfl:    Cholecalciferol (VITAMIN D3) 1000 units CAPS, Take 1,000 Units by mouth daily., Disp: , Rfl:    EPINEPHrine 0.3 mg/0.3 mL IJ SOAJ injection, Inject 0.3 mg into the muscle as needed for anaphylaxis., Disp: 2 each, Rfl: 1   famotidine (PEPCID) 20 MG tablet, Take 1 tablet (20 mg total) by mouth daily., Disp: , Rfl:    hydrocortisone 1 % lotion, Apply 1 Application topically 2 (two) times daily., Disp: 118 mL, Rfl: 0  LORazepam (ATIVAN) 0.5 MG tablet, Take 0.25-0.5 mg by mouth 2 (two) times daily as needed for anxiety., Disp: , Rfl:    magic mouthwash (nystatin, diphenhydrAMINE, alum & mag hydroxide) suspension mixture, Swish and spit 5 mLs 4 (four) times daily as needed for mouth pain., Disp: 240 mL, Rfl: 0   nystatin (MYCOSTATIN) 100000 UNIT/ML suspension, Swish and swallow 5 mLs in the mouth or throat 4 times daily, Disp: 60 mL, Rfl: 0   oxyCODONE (OXY IR/ROXICODONE) 5 MG immediate release tablet, Take 1 tablet (5 mg total) by mouth every 6 (six) hours as needed for severe pain. (Patient not taking: Reported on 01/28/2022), Disp: 5 tablet, Rfl: 0   pegfilgrastim (NEULASTA) 6 MG/0.6ML injection, Inject 6 mg into the skin once.  Inject 0.6 mLs (6 mg total) into the skin once  for 1 dose.  Administer at least 24hrs post end time of last chemotherapy drug., Disp: , Rfl:    sertraline (ZOLOFT) 50 MG tablet, Take 50 mg by mouth daily., Disp: , Rfl:    TIROSINT 100 MCG CAPS, Take 100 mcg by mouth every Sunday., Disp: , Rfl:    TIROSINT 88 MCG CAPS, Take 88 mcg by mouth See admin instructions. Take 88 mcg by mouth daily Monday-Saturday, Disp: , Rfl: 10   traMADol (ULTRAM) 50 MG tablet, Take 1 tablet (50 mg total) by mouth every 8 (eight) hours as needed., Disp: 20 tablet, Rfl: 0  PHYSICAL EXAM: ECOG FS:1 - Symptomatic but completely ambulatory   '@VITALSM'$ @  Patient speaking in clear sentences, no respiratory distress    LABORATORY DATA: I have reviewed the data as listed    Latest Ref Rng & Units 05/05/2022    1:42 PM 04/24/2022    1:02 PM 04/03/2022    8:23 AM  CBC  WBC 4.0 - 10.5 K/uL 68.8  16.4  19.1   Hemoglobin 12.0 - 15.0 g/dL 10.3  12.1  12.9   Hematocrit 36.0 - 46.0 % 31.3  36.5  39.1   Platelets 150 - 400 K/uL 273  410  333         Latest Ref Rng & Units 05/05/2022    1:42 PM 04/24/2022    1:02 PM 04/03/2022    8:23 AM  CMP  Glucose 70 - 99 mg/dL 103  127  208   BUN 8 - 23 mg/dL '8  13  16   '$ Creatinine 0.44 - 1.00 mg/dL 0.70  0.47  0.61   Sodium 135 - 145 mmol/L 138  140  138   Potassium 3.5 - 5.1 mmol/L 3.6  3.8  3.8   Chloride 98 - 111 mmol/L 104  107  105   CO2 22 - 32 mmol/L '27  27  26   '$ Calcium 8.9 - 10.3 mg/dL 9.0  9.3  9.5   Total Protein 6.5 - 8.1 g/dL 5.9  6.9  7.4   Total Bilirubin 0.3 - 1.2 mg/dL 0.4  0.3  0.3   Alkaline Phos 38 - 126 U/L 193  78  86   AST 15 - 41 U/L '27  17  18   '$ ALT 0 - 44 U/L '22  19  17        '$ RADIOGRAPHIC STUDIES: I have personally reviewed the radiological images as listed and agreed with the findings in the report. '@IMAGES'$ @ DG Chest 2 View  Result Date: 05/05/2022 CLINICAL DATA:  Cough EXAM: CHEST - 2 VIEW COMPARISON:  723 FINDINGS: LEFT subclavian Port-A-Cath with tip projecting  over SVC. Normal  heart size, mediastinal contours, and pulmonary vascularity. Peribronchial thickening with minimal chronic blunting of the costophrenic angles. No acute infiltrate, pleural effusion, or pneumothorax. Surgical clips RIGHT breast and RIGHT axilla. No acute osseous findings. IMPRESSION: Chronic bronchitic changes with minimal chronic blunting of the costophrenic angles. No acute abnormalities. Electronically Signed   By: Lavonia Dana M.D.   On: 05/05/2022 16:49     ASSESSMENT & PLAN: Patient is a 62 y.o. female ***   Visit Diagnosis: No diagnosis found.   No orders of the defined types were placed in this encounter.   All questions were answered. The patient knows to call the clinic with any problems, questions or concerns. No barriers to learning was detected.  Time spent with patient on telephone encounter: 10 minutes   Thank you for allowing me to participate in the care of this patient.    Barrie Folk, PA-C Department of Hematology/Oncology [facility] at [facility] Phone: [phone]  [phone]    05/06/2022 11:16 AM

## 2022-05-07 NOTE — Progress Notes (Signed)
I connected with Samantha [name] on 05/07/22 at 10:00 AM EST by telephone and verified that I am speaking with the correct person using two identifiers.   I discussed the limitations, risks, security and privacy concerns of performing an evaluation and management service by telemedicine and the availability of in-person appointments. I also discussed with the patient that there may be a patient responsible charge related to this service. The patient expressed understanding and agreed to proceed.   Other persons participating in the visit and their role in the encounter: ***   Patient's location: ***  Provider's location: ***       Symptom Management Consult note [place]    Patient Care Team: Crist Infante, MD as PCP - General (Internal Medicine) Mauro Kaufmann, RN as Oncology Nurse Navigator [name], RN as Oncology Nurse Navigator Stark Klein, MD as Consulting Physician (General Surgery) Truitt Merle, MD as Consulting Physician (Hematology) Kyung Rudd, MD as Consulting Physician (Radiation Oncology) Marcene Corning, MD as Consulting Physician (Endocrinology) Stefanie Libel, MD as Consulting Physician ([place]) Marylynn Pearson, MD as Consulting Physician (Obstetrics and Gynecology)    Name of the patient: Samantha [name]  [id number]  1960/06/27   Date of visit: 05/06/2022    Chief complaint/ Reason for visit- ***  Oncology History  Malignant neoplasm of upper-outer quadrant of right breast in female, estrogen receptor negative ([place])  12/06/2021 Mammogram   CLINICAL DATA:  Patient returns today to evaluate RIGHT breast calcifications identified on a recent screening mammogram.   EXAM: DIGITAL DIAGNOSTIC UNILATERAL RIGHT MAMMOGRAM  IMPRESSION: Grouped coarse heterogeneous calcifications within the upper-outer quadrant of the RIGHT breast, measuring 6 mm extent. These may be fibroadenomatous calcifications. Stereotactic biopsy is recommended to  exclude malignancy.   12/18/2021 Initial Biopsy   Diagnosis Breast, right, needle core biopsy, upper outer quadrant, x clip - DUCTAL CARCINOMA IN SITU, SOLID AND CRIBRIFORM TYPE WITH COMEDONECROSIS AND ASSOCIATED CALCIFICATIONS, NUCLEAR GRADE 3 OF 3 - FOCAL MICROINVASION IS PRESENT (LESS THAN 1 MM) WITH EVIDENCE OF LYMPHOVASCULAR INVASION - NECROSIS: PRESENT - CALCIFICATIONS: PRESENT - DCIS LENGTH: 7 MM IN GREATEST LINEAR DIMENSION ON FRAGMENTED CORES  PROGNOSTIC INDICATORS Results: IMMUNOHISTOCHEMICAL AND MORPHOMETRIC ANALYSIS PERFORMED MANUALLY The tumor cells are NEGATIVE for Her2 (1+). Estrogen Receptor: 0%, NEGATIVE Progesterone Receptor: 0%, NEGATIVE Proliferation Marker Ki67: 60%   12/23/2021 Initial Diagnosis   Malignant neoplasm of upper-outer quadrant of right breast in female, estrogen receptor negative ([place])   12/23/2021 Imaging   EXAM: ULTRASOUND OF THE RIGHT AXILLA  IMPRESSION: No abnormal appearing RIGHT axillary lymph nodes.   01/08/2022 Cancer Staging   Staging form: Breast, AJCC 8th Edition - Pathologic stage from 01/08/2022: Stage IB (pT1b, pN0, cM0, G3, ER-, PR-, HER2-) - Signed by Truitt Merle, MD on 01/24/2022 Stage prefix: Initial diagnosis Histologic grading system: 3 grade system Residual tumor (R): R0 - None   02/20/2022 - 04/24/2022 Chemotherapy   Patient is on Treatment Plan : BREAST TC q21d       Current Therapy: ***  Interval history- ***   Denies any neurologic complaints. Denies recent fevers or illnesses. Denies any easy bleeding or bruising. Reports good appetite and denies weight loss. Denies chest pain. Denies any nausea, vomiting, constipation, or diarrhea. Denies urinary complaints. Patient offers no further specific complaints today.    ROS     Allergies  Allergen Reactions  . Nortriptyline Other (See Comments)    depression  . Singulair [Montelukast Sodium] Other (See Comments)  Depressed mood  . Sulfonamide Derivatives Nausea  And Vomiting  . Amitriptyline Hcl     Depression mood  . Esomeprazole Hives  . Gabapentin     Hair Loss  . Lexapro [Escitalopram]     Depression mood  . Neosporin [Bacitracin-Polymyxin B] Rash     Past Medical History:  Diagnosis Date  . Allergy   . Anxiety 2015   Result of culmination of super stressful caregiving and deaths of 2 immediate family members.  Overall do pretty well.  . Cancer ([place]) 2023   right breast  . Depression   . GERD (gastroesophageal reflux disease)   . Hypothyroidism   . Right carpal tunnel syndrome 09/19/2019  . Thyroid disease      Past Surgical History:  Procedure Laterality Date  . BREAST LUMPECTOMY WITH RADIOACTIVE SEED LOCALIZATION Right 01/08/2022   Procedure: RIGHT BREAST LUMPECTOMY WITH RADIOACTIVE SEED LOCALIZATION;  Surgeon: Stark Klein, MD;  Location: [place];  Service: General;  Laterality: Right;  . PORTACATH PLACEMENT Left 01/28/2022   Procedure: INSERTION PORT-A-CATH;  Surgeon: Stark Klein, MD;  Location: [place] [facility];  Service: General;  Laterality: Left;  . SENTINEL NODE BIOPSY Right 01/28/2022   Procedure: RIGHT SENTINEL LYMPH NODE BIOPSY;  Surgeon: Stark Klein, MD;  Location: [facility];  Service: General;  Laterality: Right;  . TONSILLECTOMY      Social History   Socioeconomic History  . Marital status: Married    Spouse name: Not on file  . Number of children: 1  . Years of education: Not on file  . Highest education level: Not on file  Occupational History  . Not on file  Tobacco Use  . Smoking status: Never  . Smokeless tobacco: Never  Vaping Use  . Vaping Use: Never used  Substance and Sexual Activity  . Alcohol use: Yes    Comment: maybe 1 drink 4 times a year  . Drug use: No  . Sexual activity: Not Currently    Birth control/protection: Post-menopausal  Other Topics Concern  . Not on file  Social History Narrative  . Not on file   Social Determinants of Health    Financial Resource Strain: Not on file  Food Insecurity: Not on file  Transportation Needs: Not on file  Physical Activity: Not on file  Stress: Not on file  Social Connections: Not on file  Intimate Partner Violence: Not on file    Family History  Problem Relation Age of Onset  . Hyperlipidemia Father   . Diabetes Maternal Grandmother      Current Outpatient Medications:  .  acetaminophen (TYLENOL) 500 MG tablet, Take 1,000 mg by mouth every 6 (six) hours as needed for moderate pain., Disp: , Rfl:  .  benzonatate (TESSALON) 100 MG capsule, Take 2 capsules (200 mg total) by mouth 3 (three) times daily as needed for cough., Disp: 20 capsule, Rfl: 0 .  Calcium Carb-Cholecalciferol (CALCIUM-VITAMIN D3) 250-125 MG-UNIT TABS, Take 1 tablet once a day, Disp: , Rfl:  .  chlorpheniramine-HYDROcodone (TUSSIONEX) 10-8 MG/5ML, Take 5 mLs by mouth every 12 (twelve) hours as needed for up to 5 days for cough., Disp: 50 mL, Rfl: 0 .  Cholecalciferol (VITAMIN D3) 1000 units CAPS, Take 1,000 Units by mouth daily., Disp: , Rfl:  .  EPINEPHrine 0.3 mg/0.3 mL IJ SOAJ injection, Inject 0.3 mg into the muscle as needed for anaphylaxis., Disp: 2 each, Rfl: 1 .  famotidine (PEPCID) 20 MG tablet, Take 1 tablet (20  mg total) by mouth daily., Disp: , Rfl:  .  hydrocortisone 1 % lotion, Apply 1 Application topically 2 (two) times daily., Disp: 118 mL, Rfl: 0 .  LORazepam (ATIVAN) 0.5 MG tablet, Take 0.25-0.5 mg by mouth 2 (two) times daily as needed for anxiety., Disp: , Rfl:  .  magic mouthwash (nystatin, diphenhydrAMINE, alum & mag hydroxide) suspension mixture, Swish and spit 5 mLs 4 (four) times daily as needed for mouth pain., Disp: 240 mL, Rfl: 0 .  nystatin (MYCOSTATIN) 100000 UNIT/ML suspension, Swish and swallow 5 mLs in the mouth or throat 4 times daily, Disp: 60 mL, Rfl: 0 .  oxyCODONE (OXY IR/ROXICODONE) 5 MG immediate release tablet, Take 1 tablet (5 mg total) by mouth every 6 (six) hours as needed  for severe pain. (Patient not taking: Reported on 01/28/2022), Disp: 5 tablet, Rfl: 0 .  pegfilgrastim (NEULASTA) 6 MG/0.6ML injection, Inject 6 mg into the skin once.  Inject 0.6 mLs (6 mg total) into the skin once for 1 dose.  Administer at least 24hrs post end time of last chemotherapy drug., Disp: , Rfl:  .  sertraline (ZOLOFT) 50 MG tablet, Take 50 mg by mouth daily., Disp: , Rfl:  .  TIROSINT 100 MCG CAPS, Take 100 mcg by mouth every Sunday., Disp: , Rfl:  .  TIROSINT 88 MCG CAPS, Take 88 mcg by mouth See admin instructions. Take 88 mcg by mouth daily Monday-Saturday, Disp: , Rfl: 10 .  traMADol (ULTRAM) 50 MG tablet, Take 1 tablet (50 mg total) by mouth every 8 (eight) hours as needed., Disp: 20 tablet, Rfl: 0  PHYSICAL EXAM: ECOG FS:{CHL ONC RC:[id number]}   There were no vitals filed for this visit.  Patient speaking in clear sentences, no respiratory distress    LABORATORY DATA: I have reviewed the data as listed    Latest Ref Rng & Units 05/05/2022    1:42 PM 04/24/2022    1:02 PM 04/03/2022    8:23 AM  CBC  WBC 4.0 - 10.5 K/uL 68.8  16.4  19.1   Hemoglobin 12.0 - 15.0 g/dL 10.3  12.1  12.9   Hematocrit 36.0 - 46.0 % 31.3  36.5  39.1   Platelets 150 - 400 K/uL 273  410  333         Latest Ref Rng & Units 05/05/2022    1:42 PM 04/24/2022    1:02 PM 04/03/2022    8:23 AM  CMP  Glucose 70 - 99 mg/dL 103  127  208   BUN 8 - 23 mg/dL '8  13  16   '$ Creatinine 0.44 - 1.00 mg/dL 0.70  0.47  0.61   Sodium 135 - 145 mmol/L 138  140  138   Potassium 3.5 - 5.1 mmol/L 3.6  3.8  3.8   Chloride 98 - 111 mmol/L 104  107  105   CO2 22 - 32 mmol/L '27  27  26   '$ Calcium 8.9 - 10.3 mg/dL 9.0  9.3  9.5   Total Protein 6.5 - 8.1 g/dL 5.9  6.9  7.4   Total Bilirubin 0.3 - 1.2 mg/dL 0.4  0.3  0.3   Alkaline Phos 38 - 126 U/L 193  78  86   AST 15 - 41 U/L '27  17  18   '$ ALT 0 - 44 U/L '22  19  17        '$ RADIOGRAPHIC STUDIES: I have personally reviewed the radiological images as listed  and  agreed with the findings in the report. No images are attached to the encounter. DG Chest 2 View  Result Date: 05/05/2022 CLINICAL DATA:  Cough EXAM: CHEST - 2 VIEW COMPARISON:  723 FINDINGS: LEFT subclavian Port-A-Cath with tip projecting over SVC. Normal heart size, mediastinal contours, and pulmonary vascularity. Peribronchial thickening with minimal chronic blunting of the costophrenic angles. No acute infiltrate, pleural effusion, or pneumothorax. Surgical clips RIGHT breast and RIGHT axilla. No acute osseous findings. IMPRESSION: Chronic bronchitic changes with minimal chronic blunting of the costophrenic angles. No acute abnormalities. Electronically Signed   By: Lavonia Dana M.D.   On: 05/05/2022 16:49     ASSESSMENT & PLAN: Patient is a 62 y.o. female ***   Visit Diagnosis: No diagnosis found.   No orders of the defined types were placed in this encounter.   All questions were answered. The patient knows to call the clinic with any problems, questions or concerns. No barriers to learning was detected.  Time spent with patient on telephone encounter: 10 minutes   Thank you for allowing me to participate in the care of this patient.    Barrie Folk, PA-C Department of Hematology/Oncology [facility] at [facility] Phone: [phone]  [phone]    05/07/2022 2:35 PM

## 2022-05-08 ENCOUNTER — Encounter: Payer: Self-pay | Admitting: Hematology

## 2022-05-08 ENCOUNTER — Encounter: Payer: Self-pay | Admitting: Physician Assistant

## 2022-05-08 MED ORDER — BENZONATATE 200 MG PO CAPS: 200.0000 mg | ORAL_CAPSULE | Freq: Three times a day (TID) | ORAL | 0 refills | Status: DC | PRN

## 2022-05-12 ENCOUNTER — Encounter (HOSPITAL_BASED_OUTPATIENT_CLINIC_OR_DEPARTMENT_OTHER): Payer: Self-pay | Admitting: General Surgery

## 2022-05-12 ENCOUNTER — Other Ambulatory Visit: Payer: Self-pay

## 2022-05-13 ENCOUNTER — Encounter: Payer: Self-pay | Admitting: Physician Assistant

## 2022-05-15 MED ORDER — CHLORHEXIDINE GLUCONATE CLOTH 2 % EX PADS: 6.0000 | MEDICATED_PAD | Freq: Once | CUTANEOUS | Status: DC

## 2022-05-15 NOTE — Progress Notes (Signed)

## 2022-05-16 ENCOUNTER — Telehealth: Payer: Self-pay

## 2022-05-16 ENCOUNTER — Other Ambulatory Visit: Payer: Self-pay | Admitting: Physician Assistant

## 2022-05-16 MED ORDER — BENZONATATE 100 MG PO CAPS: 100.0000 mg | ORAL_CAPSULE | Freq: Three times a day (TID) | ORAL | 0 refills | Status: DC | PRN

## 2022-05-16 NOTE — Telephone Encounter (Signed)
Patient called [facility] requesting refill for Tessalon Perles. Eloise Harman., PA-C made aware and prescription refill sent to patient's pharmacy. Per patient, her cough is better, but she wants medication on hand for when she gets her port removed as she does "not want to cough with the stiches from port removal". Patient was advised that this will be the last refill of Tessalon Perles and that she can follow up with her PCP for continued cough symptoms as she has completed chemotherapy. No further questions at this time and patient verbalized thanks and understanding.

## 2022-05-20 ENCOUNTER — Other Ambulatory Visit: Payer: Self-pay

## 2022-05-20 ENCOUNTER — Ambulatory Visit (HOSPITAL_BASED_OUTPATIENT_CLINIC_OR_DEPARTMENT_OTHER): Payer: 59 | Admitting: Anesthesiology

## 2022-05-20 ENCOUNTER — Encounter (HOSPITAL_BASED_OUTPATIENT_CLINIC_OR_DEPARTMENT_OTHER): Payer: Self-pay | Admitting: General Surgery

## 2022-05-20 ENCOUNTER — Encounter (HOSPITAL_BASED_OUTPATIENT_CLINIC_OR_DEPARTMENT_OTHER)
Admission: RE | Disposition: A | Discharge status: Home / Self Care | Payer: Self-pay | Source: Home / Self Care | Attending: General Surgery

## 2022-05-20 ENCOUNTER — Ambulatory Visit (HOSPITAL_BASED_OUTPATIENT_CLINIC_OR_DEPARTMENT_OTHER)
Admission: RE | Admit: 2022-05-20 | Discharge: 2022-05-20 | Disposition: A | Discharge status: Home / Self Care | Payer: 59 | Attending: General Surgery | Admitting: General Surgery

## 2022-05-20 DIAGNOSIS — C50911 Malignant neoplasm of unspecified site of right female breast: Secondary | ICD-10-CM | POA: Insufficient documentation

## 2022-05-20 DIAGNOSIS — Z452 Encounter for adjustment and management of vascular access device: Secondary | ICD-10-CM | POA: Insufficient documentation

## 2022-05-20 DIAGNOSIS — F419 Anxiety disorder, unspecified: Secondary | ICD-10-CM | POA: Insufficient documentation

## 2022-05-20 DIAGNOSIS — E039 Hypothyroidism, unspecified: Secondary | ICD-10-CM | POA: Diagnosis not present

## 2022-05-20 DIAGNOSIS — M1990 Unspecified osteoarthritis, unspecified site: Secondary | ICD-10-CM | POA: Insufficient documentation

## 2022-05-20 DIAGNOSIS — R69 Illness, unspecified: Secondary | ICD-10-CM | POA: Diagnosis not present

## 2022-05-20 DIAGNOSIS — F418 Other specified anxiety disorders: Secondary | ICD-10-CM | POA: Diagnosis not present

## 2022-05-20 DIAGNOSIS — F32A Depression, unspecified: Secondary | ICD-10-CM | POA: Insufficient documentation

## 2022-05-20 DIAGNOSIS — G709 Myoneural disorder, unspecified: Secondary | ICD-10-CM | POA: Diagnosis not present

## 2022-05-20 DIAGNOSIS — Z9221 Personal history of antineoplastic chemotherapy: Secondary | ICD-10-CM | POA: Insufficient documentation

## 2022-05-20 DIAGNOSIS — K219 Gastro-esophageal reflux disease without esophagitis: Secondary | ICD-10-CM | POA: Diagnosis not present

## 2022-05-20 HISTORY — PX: PORT-A-CATH REMOVAL: SHX5289

## 2022-05-20 SURGERY — REMOVAL PORT-A-CATH
Anesthesia: Monitor Anesthesia Care | Site: Chest | Laterality: Left

## 2022-05-20 MED ORDER — LIDOCAINE HCL (CARDIAC) PF 100 MG/5ML IV SOSY
PREFILLED_SYRINGE | INTRAVENOUS | Status: DC | PRN
  Administered 2022-05-20: 50 mg via INTRAVENOUS

## 2022-05-20 MED ORDER — EPHEDRINE 5 MG/ML INJ
INTRAVENOUS | Status: AC
  Filled 2022-05-20: qty 5

## 2022-05-20 MED ORDER — LIDOCAINE-EPINEPHRINE (PF) 1 %-1:200000 IJ SOLN
INTRAMUSCULAR | Status: DC | PRN
  Administered 2022-05-20: 10 mL via INTRAMUSCULAR

## 2022-05-20 MED ORDER — PROPOFOL 500 MG/50ML IV EMUL
INTRAVENOUS | Status: DC | PRN
  Administered 2022-05-20: 200 ug/kg/min via INTRAVENOUS

## 2022-05-20 MED ORDER — ONDANSETRON HCL 4 MG/2ML IJ SOLN
INTRAMUSCULAR | Status: AC
  Filled 2022-05-20: qty 2

## 2022-05-20 MED ORDER — CEFAZOLIN SODIUM-DEXTROSE 2-4 GM/100ML-% IV SOLN
INTRAVENOUS | Status: AC
  Filled 2022-05-20: qty 100

## 2022-05-20 MED ORDER — MIDAZOLAM HCL 2 MG/2ML IJ SOLN
INTRAMUSCULAR | Status: AC
  Filled 2022-05-20: qty 2

## 2022-05-20 MED ORDER — MIDAZOLAM HCL 5 MG/5ML IJ SOLN
INTRAMUSCULAR | Status: DC | PRN
  Administered 2022-05-20: .5 mg via INTRAVENOUS

## 2022-05-20 MED ORDER — FENTANYL CITRATE (PF) 100 MCG/2ML IJ SOLN: 25.0000 ug | INTRAMUSCULAR | Status: DC | PRN

## 2022-05-20 MED ORDER — ACETAMINOPHEN 500 MG PO TABS
ORAL_TABLET | ORAL | Status: AC
  Filled 2022-05-20: qty 2

## 2022-05-20 MED ORDER — ONDANSETRON HCL 4 MG/2ML IJ SOLN
INTRAMUSCULAR | Status: DC | PRN
  Administered 2022-05-20: 4 mg via INTRAVENOUS

## 2022-05-20 MED ORDER — LACTATED RINGERS IV SOLN: INTRAVENOUS | Status: DC | PRN

## 2022-05-20 MED ORDER — FENTANYL CITRATE (PF) 100 MCG/2ML IJ SOLN
INTRAMUSCULAR | Status: DC | PRN
  Administered 2022-05-20: 25 ug via INTRAVENOUS

## 2022-05-20 MED ORDER — SUCCINYLCHOLINE CHLORIDE 200 MG/10ML IV SOSY
PREFILLED_SYRINGE | INTRAVENOUS | Status: AC
  Filled 2022-05-20: qty 10

## 2022-05-20 MED ORDER — ACETAMINOPHEN 500 MG PO TABS
1000.0000 mg | ORAL_TABLET | ORAL | Status: AC
  Administered 2022-05-20: 1000 mg via ORAL

## 2022-05-20 MED ORDER — ONDANSETRON HCL 4 MG/2ML IJ SOLN: 4.0000 mg | Freq: Once | INTRAMUSCULAR | Status: DC | PRN

## 2022-05-20 MED ORDER — PHENYLEPHRINE 80 MCG/ML (10ML) SYRINGE FOR IV PUSH (FOR BLOOD PRESSURE SUPPORT)
PREFILLED_SYRINGE | INTRAVENOUS | Status: AC
  Filled 2022-05-20: qty 10

## 2022-05-20 MED ORDER — LIDOCAINE 2% (20 MG/ML) 5 ML SYRINGE
INTRAMUSCULAR | Status: AC
  Filled 2022-05-20: qty 5

## 2022-05-20 MED ORDER — FENTANYL CITRATE (PF) 100 MCG/2ML IJ SOLN
INTRAMUSCULAR | Status: AC
  Filled 2022-05-20: qty 2

## 2022-05-20 MED ORDER — CEFAZOLIN SODIUM-DEXTROSE 2-4 GM/100ML-% IV SOLN
2.0000 g | INTRAVENOUS | Status: AC
  Administered 2022-05-20: 2 g via INTRAVENOUS

## 2022-05-20 MED ORDER — ATROPINE SULFATE 0.4 MG/ML IV SOLN
INTRAVENOUS | Status: AC
  Filled 2022-05-20: qty 1

## 2022-05-20 SURGICAL SUPPLY — 32 items
ADH SKN CLS APL DERMABOND .7 (GAUZE/BANDAGES/DRESSINGS) ×1
APL PRP STRL LF DISP 70% ISPRP (MISCELLANEOUS) ×1
BLADE HEX COATED 2.75 (ELECTRODE) ×2 IMPLANT
BLADE SURG 15 STRL LF DISP TIS (BLADE) ×2 IMPLANT
BLADE SURG 15 STRL SS (BLADE) ×1
CANISTER SUCT 1200ML W/VALVE (MISCELLANEOUS) IMPLANT
CHLORAPREP W/TINT 26 (MISCELLANEOUS) ×2 IMPLANT
COVER BACK TABLE 60X90IN (DRAPES) ×2 IMPLANT
COVER MAYO STAND STRL (DRAPES) ×2 IMPLANT
DERMABOND ADVANCED .7 DNX12 (GAUZE/BANDAGES/DRESSINGS) ×2 IMPLANT
DRAPE LAPAROTOMY 100X72 PEDS (DRAPES) ×2 IMPLANT
DRAPE UTILITY XL STRL (DRAPES) ×2 IMPLANT
ELECT REM PT RETURN 9FT ADLT (ELECTROSURGICAL) ×1
ELECTRODE REM PT RTRN 9FT ADLT (ELECTROSURGICAL) ×2 IMPLANT
GLOVE BIO SURGEON STRL SZ 6 (GLOVE) ×2 IMPLANT
GLOVE BIOGEL PI IND STRL 6.5 (GLOVE) ×2 IMPLANT
GOWN STRL REUS W/ TWL LRG LVL3 (GOWN DISPOSABLE) ×2 IMPLANT
GOWN STRL REUS W/TWL 2XL LVL3 (GOWN DISPOSABLE) ×2 IMPLANT
GOWN STRL REUS W/TWL LRG LVL3 (GOWN DISPOSABLE) ×1
NDL HYPO 25X1 1.5 SAFETY (NEEDLE) ×2 IMPLANT
NEEDLE HYPO 25X1 1.5 SAFETY (NEEDLE) ×1 IMPLANT
NS IRRIG 1000ML POUR BTL (IV SOLUTION) IMPLANT
PACK BASIN DAY SURGERY FS (CUSTOM PROCEDURE TRAY) ×2 IMPLANT
PENCIL SMOKE EVACUATOR (MISCELLANEOUS) ×2 IMPLANT
SPIKE FLUID TRANSFER (MISCELLANEOUS) IMPLANT
SUT MNCRL AB 4-0 PS2 18 (SUTURE) ×2 IMPLANT
SUT VIC AB 3-0 SH 27 (SUTURE) ×1
SUT VIC AB 3-0 SH 27X BRD (SUTURE) ×2 IMPLANT
SYR CONTROL 10ML LL (SYRINGE) ×2 IMPLANT
TOWEL GREEN STERILE FF (TOWEL DISPOSABLE) ×2 IMPLANT
TUBE CONNECTING 20X1/4 (TUBING) IMPLANT
YANKAUER SUCT BULB TIP NO VENT (SUCTIONS) IMPLANT

## 2022-05-20 NOTE — Anesthesia Preprocedure Evaluation (Addendum)
Anesthesia Evaluation  Patient identified by MRN, date of birth, ID band Patient awake    Reviewed: Allergy & Precautions, NPO status , Patient's Chart, lab work & pertinent test results  History of Anesthesia Complications Negative for: history of anesthetic complications  Airway Mallampati: II  TM Distance: >3 FB Neck ROM: Full    Dental  (+) Teeth Intact, Dental Advisory Given   Pulmonary neg pulmonary ROS   Pulmonary exam normal breath sounds clear to auscultation       Cardiovascular Exercise Tolerance: Good negative cardio ROS Normal cardiovascular exam Rhythm:Regular Rate:Normal     Neuro/Psych  PSYCHIATRIC DISORDERS Anxiety Depression     Neuromuscular disease    GI/Hepatic Neg liver ROS,GERD  Medicated,,  Endo/Other  Hypothyroidism    Renal/GU negative Renal ROS     Musculoskeletal  (+) Arthritis ,    Abdominal   Peds  Hematology negative hematology ROS (+)   Anesthesia Other Findings Day of surgery medications reviewed with the patient.  Right breast cancer s/p port placement   Reproductive/Obstetrics                             Anesthesia Physical Anesthesia Plan  ASA: 3  Anesthesia Plan: MAC   Post-op Pain Management: Tylenol PO (pre-op)*   Induction: Intravenous  PONV Risk Score and Plan: 2 and Treatment may vary due to age or medical condition and TIVA  Airway Management Planned: Natural Airway and Simple Face Mask  Additional Equipment:   Intra-op Plan:   Post-operative Plan:   Informed Consent: I have reviewed the patients History and Physical, chart, labs and discussed the procedure including the risks, benefits and alternatives for the proposed anesthesia with the patient or authorized representative who has indicated his/her understanding and acceptance.     Dental advisory given  Plan Discussed with: CRNA  Anesthesia Plan Comments:         Anesthesia Quick Evaluation

## 2022-05-20 NOTE — H&P (Signed)
Samantha [name] is an 62 y.o. female.   Chief Complaint: port in place HPI:  Pt is s/p breast conserving surgery and chemotherapy for right breast cancer.  She desires port removal.  She denies left chest pain.    Past Medical History:  Diagnosis Date   Allergy    Anxiety 2015   Result of culmination of super stressful caregiving and deaths of 2 immediate family members.  Overall do pretty well.   Cancer [facility]) 2023   right breast   Depression    GERD (gastroesophageal reflux disease)    Hypothyroidism    Right carpal tunnel syndrome 09/19/2019   Thyroid disease     Past Surgical History:  Procedure Laterality Date   BREAST LUMPECTOMY WITH RADIOACTIVE SEED LOCALIZATION Right 01/08/2022   Procedure: RIGHT BREAST LUMPECTOMY WITH RADIOACTIVE SEED LOCALIZATION;  Surgeon: Stark Klein, MD;  Location: [place];  Service: General;  Laterality: Right;   PORTACATH PLACEMENT Left 01/28/2022   Procedure: INSERTION PORT-A-CATH;  Surgeon: Stark Klein, MD;  Location: [place];  Service: General;  Laterality: Left;   SENTINEL NODE BIOPSY Right 01/28/2022   Procedure: RIGHT SENTINEL LYMPH NODE BIOPSY;  Surgeon: Stark Klein, MD;  Location: [place];  Service: General;  Laterality: Right;   TONSILLECTOMY      Family History  Problem Relation Age of Onset   Hyperlipidemia Father    Diabetes Maternal Grandmother    Social History:  reports that she has never smoked. She has never used smokeless tobacco. She reports current alcohol use. She reports that she does not use drugs.  Allergies:  Allergies  Allergen Reactions   Nortriptyline Other (See Comments)    depression   Singulair [Montelukast Sodium] Other (See Comments)    Depressed mood   Sulfonamide Derivatives Nausea And Vomiting   Amitriptyline Hcl     Depression mood   Esomeprazole Hives   Gabapentin     Hair Loss   Lexapro [Escitalopram]     Depression mood   Neosporin [Bacitracin-Polymyxin B]  Rash    Medications Prior to Admission  Medication Sig Dispense Refill   benzonatate (TESSALON) 100 MG capsule Take 1 capsule (100 mg total) by mouth 3 (three) times daily as needed for cough. 20 capsule 0   Calcium Carb-Cholecalciferol (CALCIUM-VITAMIN D3) 250-125 MG-UNIT TABS Take 1 tablet once a day     Cholecalciferol (VITAMIN D3) 1000 units CAPS Take 1,000 Units by mouth daily.     Dextromethorphan HBr (DELSYM PO) Take by mouth.     famotidine (PEPCID) 20 MG tablet Take 1 tablet (20 mg total) by mouth daily.     pegfilgrastim (NEULASTA) 6 MG/0.6ML injection Inject 6 mg into the skin once.  Inject 0.6 mLs (6 mg total) into the skin once for 1 dose.  Administer at least 24hrs post end time of last chemotherapy drug.     sertraline (ZOLOFT) 50 MG tablet Take 50 mg by mouth daily.     TIROSINT 88 MCG CAPS Take 88 mcg by mouth See admin instructions. Take 88 mcg by mouth daily Monday-Saturday  10   acetaminophen (TYLENOL) 500 MG tablet Take 1,000 mg by mouth every 6 (six) hours as needed for moderate pain.     EPINEPHrine 0.3 mg/0.3 mL IJ SOAJ injection Inject 0.3 mg into the muscle as needed for anaphylaxis. 2 each 1   hydrocortisone 1 % lotion Apply 1 Application topically 2 (two) times daily. 118 mL 0   LORazepam (ATIVAN) 0.5  MG tablet Take 0.25-0.5 mg by mouth 2 (two) times daily as needed for anxiety.     oxyCODONE (OXY IR/ROXICODONE) 5 MG immediate release tablet Take 1 tablet (5 mg total) by mouth every 6 (six) hours as needed for severe pain. (Patient not taking: Reported on 01/28/2022) 5 tablet 0   traMADol (ULTRAM) 50 MG tablet Take 1 tablet (50 mg total) by mouth every 8 (eight) hours as needed. 20 tablet 0    No results found for this or any previous visit (from the past 48 hour(s)). No results found.  Review of Systems  All other systems reviewed and are negative.   Blood pressure 121/68, pulse 93, temperature 98.1 F (36.7 C), temperature source Oral, height 5' 7.5" (1.715  m), weight 79 kg, SpO2 99 %. Physical Exam Vitals reviewed.  Constitutional:      Appearance: Normal appearance. She is normal weight.  HENT:     Head: Normocephalic and atraumatic.     Nose: Nose normal.  Eyes:     General: No scleral icterus.    Conjunctiva/sclera: Conjunctivae normal.     Pupils: Pupils are equal, round, and reactive to light.  Cardiovascular:     Rate and Rhythm: Normal rate.  Pulmonary:     Effort: Pulmonary effort is normal.     Comments: Left sided port in place Abdominal:     General: Abdomen is flat.  Musculoskeletal:        General: No deformity.     Cervical back: Neck supple.  Skin:    General: Skin is warm and dry.     Capillary Refill: Capillary refill takes 2 to 3 seconds.  Neurological:     General: No focal deficit present.     Mental Status: She is alert and oriented to person, place, and time.  Psychiatric:        Mood and Affect: Mood normal.        Behavior: Behavior normal.        Thought Content: Thought content normal.        Judgment: Judgment normal.      Assessment/Plan Port in place Right breast cancer, s/p chemotherapy.  Plan port removal. Procedure reviewed with patient and spouse.   Discussed risks and post op restrictions.   Pt desires to proceed.    Stark Klein, MD 05/20/2022, 10:23 AM

## 2022-05-20 NOTE — Discharge Instructions (Addendum)
NO TYLENOL TILL 3:30  [place] Office Phone Number [phone]   POST OP INSTRUCTIONS  Always review your discharge instruction sheet given to you by the facility where your surgery was performed.  IF YOU HAVE DISABILITY OR FAMILY LEAVE FORMS, YOU MUST BRING THEM TO THE OFFICE FOR PROCESSING.  DO NOT GIVE THEM TO YOUR DOCTOR.  Take 2 tylenol (acetominophen) three times a day for 3 days.  If you still have pain, add ibuprofen with food in between if able to take this (if you have kidney issues or stomach issues, do not take ibuprofen).  If both of those are not enough, add the narcotic pain pill.  If you find you are needing a lot of this overnight after surgery, call the next morning for a refill.   Take your usually prescribed medications unless otherwise directed If you need a refill on your pain medication, please contact your pharmacy.  They will contact our office to request authorization.  Prescriptions will not be filled after 5pm or on week-ends. You should eat very light the first 24 hours after surgery, such as soup, crackers, pudding, etc.  Resume your normal diet the day after surgery It is common to experience some constipation if taking pain medication after surgery.  Increasing fluid intake and taking a stool softener will usually help or prevent this problem from occurring.  A mild laxative (Milk of Magnesia or Miralax) should be taken according to package directions if there are no bowel movements after 48 hours. You may shower in 48 hours.  The surgical glue will flake off in 2-3 weeks.   ACTIVITIES:  No strenuous activity or heavy lifting for 1 week.   You may drive when you no longer are taking prescription pain medication, you can comfortably wear a seatbelt, and you can safely maneuver your car and apply brakes. RETURN TO WORK:  __________as tolerated if no lifting for 1 week. _______________ Dennis Bast should see your doctor in the office for a follow-up  appointment approximately three-four weeks after your surgery.    WHEN TO CALL YOUR DOCTOR: Fever over 101.0 Nausea and/or vomiting. Extreme swelling or bruising. Continued bleeding from incision. Increased pain, redness, or drainage from the incision.  The clinic staff is available to answer your questions during regular business hours.  Please don't hesitate to call and ask to speak to one of the nurses for clinical concerns.  If you have a medical emergency, go to the nearest emergency room or call 911.  A surgeon from [facility] is always on call at the hospital.  For further questions, please visit [email or web address]     Post Anesthesia Home Care Instructions  Activity: Get plenty of rest for the remainder of the day. A responsible individual must stay with you for 24 hours following the procedure.  For the next 24 hours, DO NOT: -Drive a car -Paediatric nurse -Drink alcoholic beverages -Take any medication unless instructed by your physician -Make any legal decisions or sign important papers.  Meals: Start with liquid foods such as gelatin or soup. Progress to regular foods as tolerated. Avoid greasy, spicy, heavy foods. If nausea and/or vomiting occur, drink only clear liquids until the nausea and/or vomiting subsides. Call your physician if vomiting continues.  Special Instructions/Symptoms: Your throat may feel dry or sore from the anesthesia or the breathing tube placed in your throat during surgery. If this causes discomfort, gargle with warm salt water. The discomfort should disappear within 24 hours.  If you had a scopolamine patch placed behind your ear for the management of post- operative nausea and/or vomiting:  1. The medication in the patch is effective for 72 hours, after which it should be removed.  Wrap patch in a tissue and discard in the trash. Wash hands thoroughly with soap and water. 2. You may remove the patch earlier than 72 hours  if you experience unpleasant side effects which may include dry mouth, dizziness or visual disturbances. 3. Avoid touching the patch. Wash your hands with soap and water after contact with the patch.

## 2022-05-20 NOTE — Anesthesia Postprocedure Evaluation (Signed)
Anesthesia Post Note  Patient: Samantha [name]  Procedure(s) Performed: REMOVAL PORT-A-CATH (Left: Chest)     Patient location during evaluation: PACU Anesthesia Type: MAC Level of consciousness: awake and alert Pain management: pain level controlled Vital Signs Assessment: post-procedure vital signs reviewed and stable Respiratory status: spontaneous breathing, nonlabored ventilation, respiratory function stable and patient connected to nasal cannula oxygen Cardiovascular status: stable and blood pressure returned to baseline Postop Assessment: no apparent nausea or vomiting Anesthetic complications: no  No notable events documented.  Last Vitals:  Vitals:   05/20/22 1145 05/20/22 1208  BP: 113/69 118/73  Pulse: 88 95  Resp: 15 16  Temp:  (!) 36.3 C  SpO2: 96% 98%    Last Pain:  Vitals:   05/20/22 1208  TempSrc:   PainSc: 0-No pain                 Effie Berkshire

## 2022-05-20 NOTE — Transfer of Care (Signed)
Immediate Anesthesia Transfer of Care Note  Patient: Samantha [name]  Procedure(s) Performed: REMOVAL PORT-A-CATH (Left: Chest)  Patient Location: PACU  Anesthesia Type:MAC  Level of Consciousness: awake, alert , oriented, drowsy, and patient cooperative  Airway & Oxygen Therapy: Patient Spontanous Breathing and Patient connected to face mask oxygen  Post-op Assessment: Report given to RN and Post -op Vital signs reviewed and stable  Post vital signs: Reviewed and stable  Last Vitals:  Vitals Value Taken Time  BP    Temp    Pulse 87 05/20/22 1114  Resp    SpO2 96 % 05/20/22 1114  Vitals shown include unvalidated device data.  Last Pain:  Vitals:   05/20/22 0914  TempSrc: Oral  PainSc: 0-No pain      Patients Stated Pain Goal: 3 (44/92/01 0071)  Complications: No notable events documented.

## 2022-05-20 NOTE — Op Note (Signed)
  PRE-OPERATIVE DIAGNOSIS:  un-needed Port-A-Cath for right breast cancer  POST-OPERATIVE DIAGNOSIS:  Same   PROCEDURE:  Procedure(s):  REMOVAL PORT-A-CATH  SURGEON:  Surgeon(s):  Zaid Tomes, MD  ANESTHESIA:   MAC + local  EBL:   Minimal  SPECIMEN:  None  Complications : none known  Procedure:   Pt was  identified in the holding area and taken to the operating room where she was placed supine on the operating room table.  MAC anesthesia was induced.  The left upper chest was prepped and draped.  The prior incision was anesthetized with local anesthetic.  The incision was opened with a #15 blade.  The subcutaneous tissue was divided with the cautery.  The port was identified and the capsule opened.  The four 2-0 prolene sutures were removed.  The port was then removed and pressure held on the tract.  The catheter appeared intact without evidence of breakage, length was 23.5 cm.  The wound was inspected for hemostasis, which was achieved with cautery.  The wound was closed with 3-0 vicryl deep dermal interrupted sutures and 4-0 Monocryl running subcuticular suture.  The wound was cleaned, dried, and dressed with dermabond.  The patient was awakened from anesthesia and taken to the PACU in stable condition.  Needle, sponge, and instrument counts are correct.    

## 2022-05-21 ENCOUNTER — Encounter (HOSPITAL_BASED_OUTPATIENT_CLINIC_OR_DEPARTMENT_OTHER): Payer: Self-pay | Admitting: General Surgery

## 2022-05-22 ENCOUNTER — Ambulatory Visit: Payer: Managed Care, Other (non HMO) | Admitting: Radiation Oncology

## 2022-05-22 ENCOUNTER — Ambulatory Visit: Admission: RE | Admit: 2022-05-22 | Payer: Managed Care, Other (non HMO) | Source: Ambulatory Visit

## 2022-05-23 ENCOUNTER — Ambulatory Visit: Payer: Managed Care, Other (non HMO) | Admitting: Radiation Oncology

## 2022-05-26 NOTE — Progress Notes (Signed)
Radiation Oncology         [phone] ________________________________  Name: Samantha [name]        MRN: [id number]  Date of Service: 05/29/2022 DOB: [birth year]  EI:[id number], [name], MD  Truitt Merle, MD     REFERRING PHYSICIAN: Truitt Merle, MD   DIAGNOSIS: The encounter diagnosis was Malignant neoplasm of upper-outer quadrant of right breast in female, estrogen receptor negative ([place]).   HISTORY OF PRESENT ILLNESS: Samantha [name] is a 62 y.o. female originally seen in the multidisciplinary breast clinic for a new diagnosis of right breast cancer. The patient was noted to have  screening detected calcifications in the right breast.  Further diagnostic imaging showed a grouped coarse area of calcifications in the upper outer quadrant of the right breast measuring 6 mm.  She underwent stereotactic biopsy on 12/18/2021.  Final pathology revealed grade 3 DCIS with focal microinvasion.  Her microscopic invasive component was ER/PR negative HER2 was negative with a Ki-67 of 60%.  She returned for ultrasound of the right axilla on 12/23/2021 which was negative for adenopathy.    Since her last visit, she underwent a right lumpectomy on 01/08/2022 that showed a grade 3 invasive ductal carcinoma measuring 8 mm, her margins were negative for invasive disease but she also had associated DCIS which was less than 1 mm to the anterior margin.  She returned for sentinel lymph node biopsy on 01/28/2022, 2 nodes were submitted of the 3 specimens the third being fibroadipose tissue.  Both of the nodes were negative for malignancy.  Her Port-A-Cath had been inserted at that time as well and she had a complication of a pneumothorax following the procedure.  She was able to begin systemic chemotherapy on 02/20/2022.  Her last dose of treatment was on 04/24/2022.  She is seen today to discuss adjuvant radiotherapy to the right breast.  PREVIOUS RADIATION THERAPY: No   PAST MEDICAL HISTORY:  Past Medical History:  Diagnosis Date    Allergy    Anxiety 2015   Result of culmination of super stressful caregiving and deaths of 2 immediate family members.  Overall do pretty well.   Cancer [facility]) 2023   right breast   Depression    GERD (gastroesophageal reflux disease)    Hypothyroidism    Right carpal tunnel syndrome 09/19/2019   Thyroid disease        PAST SURGICAL HISTORY: Past Surgical History:  Procedure Laterality Date   BREAST LUMPECTOMY WITH RADIOACTIVE SEED LOCALIZATION Right 01/08/2022   Procedure: RIGHT BREAST LUMPECTOMY WITH RADIOACTIVE SEED LOCALIZATION;  Surgeon: Stark Klein, MD;  Location: [place];  Service: General;  Laterality: Right;   PORT-A-CATH REMOVAL Left 05/20/2022   Procedure: REMOVAL PORT-A-CATH;  Surgeon: Stark Klein, MD;  Location: [place];  Service: General;  Laterality: Left;   PORTACATH PLACEMENT Left 01/28/2022   Procedure: INSERTION PORT-A-CATH;  Surgeon: Stark Klein, MD;  Location: [place];  Service: General;  Laterality: Left;   SENTINEL NODE BIOPSY Right 01/28/2022   Procedure: RIGHT SENTINEL LYMPH NODE BIOPSY;  Surgeon: Stark Klein, MD;  Location: [place];  Service: General;  Laterality: Right;   TONSILLECTOMY       FAMILY HISTORY:  Family History  Problem Relation Age of Onset   Hyperlipidemia Father    Diabetes Maternal Grandmother      SOCIAL HISTORY:  reports that she has never smoked. She has never used smokeless tobacco. She reports current alcohol use. She reports  that she does not use drugs. The patient is married and lives in [place]. She is married and a homemaker.   ALLERGIES: Nortriptyline, Singulair [montelukast sodium], Sulfonamide derivatives, Amitriptyline hcl, Esomeprazole, Gabapentin, Lexapro [escitalopram], and Neosporin [bacitracin-polymyxin b]   MEDICATIONS:  Current Outpatient Medications  Medication Sig Dispense Refill   benzonatate (TESSALON) 100 MG capsule Take 1 capsule (100 mg total)  by mouth 3 (three) times daily as needed for cough. 20 capsule 0   acetaminophen (TYLENOL) 500 MG tablet Take 1,000 mg by mouth every 6 (six) hours as needed for moderate pain.     Calcium Carb-Cholecalciferol (CALCIUM-VITAMIN D3) 250-125 MG-UNIT TABS Take 1 tablet once a day     Cholecalciferol (VITAMIN D3) 1000 units CAPS Take 1,000 Units by mouth daily.     Dextromethorphan HBr (DELSYM PO) Take by mouth.     EPINEPHrine 0.3 mg/0.3 mL IJ SOAJ injection Inject 0.3 mg into the muscle as needed for anaphylaxis. 2 each 1   famotidine (PEPCID) 20 MG tablet Take 1 tablet (20 mg total) by mouth daily.     hydrocortisone 1 % lotion Apply 1 Application topically 2 (two) times daily. 118 mL 0   LORazepam (ATIVAN) 0.5 MG tablet Take 0.25-0.5 mg by mouth 2 (two) times daily as needed for anxiety.     oxyCODONE (OXY IR/ROXICODONE) 5 MG immediate release tablet Take 1 tablet (5 mg total) by mouth every 6 (six) hours as needed for severe pain. (Patient not taking: Reported on 01/28/2022) 5 tablet 0   pegfilgrastim (NEULASTA) 6 MG/0.6ML injection Inject 6 mg into the skin once.  Inject 0.6 mLs (6 mg total) into the skin once for 1 dose.  Administer at least 24hrs post end time of last chemotherapy drug.     sertraline (ZOLOFT) 50 MG tablet Take 50 mg by mouth daily.     TIROSINT 88 MCG CAPS Take 88 mcg by mouth See admin instructions. Take 88 mcg by mouth daily Monday-Saturday  10   traMADol (ULTRAM) 50 MG tablet Take 1 tablet (50 mg total) by mouth every 8 (eight) hours as needed. 20 tablet 0   No current facility-administered medications for this visit.     REVIEW OF SYSTEMS: On review of systems, the patient reports that she is ***     PHYSICAL EXAM:  Wt Readings from Last 3 Encounters:  05/20/22 174 lb 2.6 oz (79 kg)  05/05/22 176 lb 8 oz (80.1 kg)  04/24/22 170 lb 11.2 oz (77.4 kg)   Temp Readings from Last 3 Encounters:  05/20/22 (!) 97.3 F (36.3 C)  05/05/22 98.7 F (37.1 C) (Oral)   04/24/22 98.2 F (36.8 C) (Oral)   BP Readings from Last 3 Encounters:  05/20/22 118/73  05/05/22 111/61  05/02/22 130/81   Pulse Readings from Last 3 Encounters:  05/20/22 95  05/05/22 (!) 107  05/02/22 79    In general this is a well appearing caucasian female in no acute distress. She's alert and oriented x4 and appropriate throughout the examination. Cardiopulmonary assessment is negative for acute distress and she exhibits normal effort.  Her right breast incision and axillary incision are both well-healed without erythema, separation, or drainage.    ECOG = ***  0 - Asymptomatic (Fully active, able to carry on all predisease activities without restriction)  1 - Symptomatic but completely ambulatory (Restricted in physically strenuous activity but ambulatory and able to carry out work of a light or sedentary nature. For example, light housework, office work)  2 - Symptomatic, <50% in bed during the day (Ambulatory and capable of all self care but unable to carry out any work activities. Up and about more than 50% of waking hours)  3 - Symptomatic, >50% in bed, but not bedbound (Capable of only limited self-care, confined to bed or chair 50% or more of waking hours)  4 - Bedbound (Completely disabled. Cannot carry on any self-care. Totally confined to bed or chair)  5 - Death   Eustace Pen MM, Creech RH, Tormey DC, et al. [phone]). "Toxicity and response criteria of the [facility] Group". [place] Oncol. 5 (6): 649-55    LABORATORY DATA:  Lab Results  Component Value Date   WBC 68.8 (HH) 05/05/2022   HGB 10.3 (L) 05/05/2022   HCT 31.3 (L) 05/05/2022   MCV 93.2 05/05/2022   PLT 273 05/05/2022   Lab Results  Component Value Date   NA 138 05/05/2022   K 3.6 05/05/2022   CL 104 05/05/2022   CO2 27 05/05/2022   Lab Results  Component Value Date   ALT 22 05/05/2022   AST 27 05/05/2022   ALKPHOS 193 (H) 05/05/2022   BILITOT 0.4 05/05/2022       RADIOGRAPHY: DG Chest 2 View  Result Date: 05/05/2022 CLINICAL DATA:  Cough EXAM: CHEST - 2 VIEW COMPARISON:  723 FINDINGS: LEFT subclavian Port-A-Cath with tip projecting over SVC. Normal heart size, mediastinal contours, and pulmonary vascularity. Peribronchial thickening with minimal chronic blunting of the costophrenic angles. No acute infiltrate, pleural effusion, or pneumothorax. Surgical clips RIGHT breast and RIGHT axilla. No acute osseous findings. IMPRESSION: Chronic bronchitic changes with minimal chronic blunting of the costophrenic angles. No acute abnormalities. Electronically Signed   By: Lavonia Dana M.D.   On: 05/05/2022 16:49       IMPRESSION/PLAN: 1. Stage IB, pT1bN0M0, grade 3 triple negative invasive ductal carcinoma of the right breast. Dr. Lisbeth Renshaw has reviewed her final pathology findings and she and I discussed the nature of early stage breast disease.  She has done well since surgery, and has completed systemic chemotherapy.  She is recovering from the effects of this.  Dr. Lisbeth Renshaw would recommend external radiotherapy to the breast  to reduce risks of local recurrence. We discussed the risks, benefits, short, and long term effects of radiotherapy, as well as the curative intent, and the patient is interested in proceeding.  We reviewed the delivery and logistics of radiotherapy and Dr. Lisbeth Renshaw recommends 4 weeks of radiotherapy to the right breast. Written consent is obtained and placed in the chart, a copy was provided to the patient. She will simulate today.   In a visit lasting *** minutes, greater than 50% of the time was spent face to face reviewing her case, as well as in preparation of, discussing, and coordinating the patient's care.      Carola Rhine, [facility]    **Disclaimer: This note was dictated with voice recognition software. Similar sounding words can inadvertently be transcribed and this note may contain transcription errors which may not have been corrected  upon publication of note.**

## 2022-05-28 ENCOUNTER — Ambulatory Visit: Payer: Managed Care, Other (non HMO) | Admitting: Radiation Oncology

## 2022-05-28 ENCOUNTER — Telehealth: Payer: Self-pay

## 2022-05-28 ENCOUNTER — Ambulatory Visit: Payer: Managed Care, Other (non HMO)

## 2022-05-28 NOTE — Telephone Encounter (Signed)
Faxed last office visit to Personal Nurse Advocate at [facility] as per Dr. Burr Medico

## 2022-05-29 ENCOUNTER — Ambulatory Visit
Admission: RE | Admit: 2022-05-29 | Discharge: 2022-05-29 | Disposition: A | Discharge status: Home / Self Care | Payer: 59 | Source: Ambulatory Visit | Attending: Radiation Oncology | Admitting: Radiation Oncology

## 2022-05-29 ENCOUNTER — Encounter: Payer: Self-pay | Admitting: Radiation Oncology

## 2022-05-29 VITALS — BP 122/69 | HR 90 | Temp 97.8°F | Resp 18 | Ht 67.5 in | Wt 176.2 lb

## 2022-05-29 DIAGNOSIS — C50411 Malignant neoplasm of upper-outer quadrant of right female breast: Secondary | ICD-10-CM | POA: Insufficient documentation

## 2022-05-29 DIAGNOSIS — Z170 Estrogen receptor positive status [ER+]: Secondary | ICD-10-CM | POA: Diagnosis not present

## 2022-05-29 DIAGNOSIS — Z923 Personal history of irradiation: Secondary | ICD-10-CM | POA: Diagnosis not present

## 2022-05-29 DIAGNOSIS — Z171 Estrogen receptor negative status [ER-]: Secondary | ICD-10-CM | POA: Insufficient documentation

## 2022-05-29 DIAGNOSIS — E039 Hypothyroidism, unspecified: Secondary | ICD-10-CM | POA: Insufficient documentation

## 2022-05-29 DIAGNOSIS — Z510 Encounter for antineoplastic radiation therapy: Secondary | ICD-10-CM | POA: Insufficient documentation

## 2022-05-29 DIAGNOSIS — Z9221 Personal history of antineoplastic chemotherapy: Secondary | ICD-10-CM | POA: Diagnosis not present

## 2022-05-29 DIAGNOSIS — K219 Gastro-esophageal reflux disease without esophagitis: Secondary | ICD-10-CM | POA: Diagnosis not present

## 2022-05-29 DIAGNOSIS — Z79899 Other long term (current) drug therapy: Secondary | ICD-10-CM | POA: Diagnosis not present

## 2022-05-29 NOTE — Progress Notes (Signed)
Nursing interview for Malignant neoplasm of upper-outer quadrant of right breast in female, estrogen receptor negative ([place]).  Patient identity verified. Patient doing well. No discomfort reported at this time.  Meaningful use complete.  BP 122/69 (BP Location: Right Arm, Patient Position: Sitting, Cuff Size: Normal)   Pulse 90   Temp 97.8 F (36.6 C) (Temporal)   Resp 18   Ht 5' 7.5" (1.715 m)   Wt 176 lb 3.2 oz (79.9 kg)   SpO2 97%   BMI 27.19 kg/m   This concludes the interview.   Leandra Kern, LPN

## 2022-05-30 ENCOUNTER — Encounter: Payer: Self-pay | Admitting: *Deleted

## 2022-05-30 ENCOUNTER — Telehealth: Payer: Self-pay | Admitting: Hematology

## 2022-05-30 NOTE — Telephone Encounter (Signed)
Per 2/16 IB scheduled patient , patient confirmed.

## 2022-06-09 DIAGNOSIS — Z510 Encounter for antineoplastic radiation therapy: Secondary | ICD-10-CM | POA: Insufficient documentation

## 2022-06-09 DIAGNOSIS — Z170 Estrogen receptor positive status [ER+]: Secondary | ICD-10-CM | POA: Diagnosis not present

## 2022-06-09 DIAGNOSIS — C50411 Malignant neoplasm of upper-outer quadrant of right female breast: Secondary | ICD-10-CM | POA: Insufficient documentation

## 2022-06-09 DIAGNOSIS — Z171 Estrogen receptor negative status [ER-]: Secondary | ICD-10-CM | POA: Diagnosis not present

## 2022-06-12 ENCOUNTER — Ambulatory Visit
Admission: RE | Admit: 2022-06-12 | Discharge: 2022-06-12 | Disposition: A | Discharge status: Home / Self Care | Payer: 59 | Source: Ambulatory Visit | Attending: Radiation Oncology | Admitting: Radiation Oncology

## 2022-06-12 ENCOUNTER — Other Ambulatory Visit: Payer: Self-pay

## 2022-06-12 DIAGNOSIS — C50411 Malignant neoplasm of upper-outer quadrant of right female breast: Secondary | ICD-10-CM | POA: Diagnosis not present

## 2022-06-12 DIAGNOSIS — Z170 Estrogen receptor positive status [ER+]: Secondary | ICD-10-CM | POA: Diagnosis not present

## 2022-06-12 DIAGNOSIS — Z171 Estrogen receptor negative status [ER-]: Secondary | ICD-10-CM | POA: Diagnosis not present

## 2022-06-12 DIAGNOSIS — Z510 Encounter for antineoplastic radiation therapy: Secondary | ICD-10-CM | POA: Diagnosis not present

## 2022-06-12 LAB — RAD ONC ARIA SESSION SUMMARY
Course Elapsed Days: 0
Plan Fractions Treated to Date: 1
Plan Prescribed Dose Per Fraction: 2.66 Gy
Plan Total Fractions Prescribed: 16
Plan Total Prescribed Dose: 42.56 Gy
Reference Point Dosage Given to Date: 2.66 Gy
Reference Point Session Dosage Given: 2.66 Gy
Session Number: 1

## 2022-06-12 MED ORDER — ALRA NON-METALLIC DEODORANT (RAD-ONC)
1.0000 | Freq: Once | TOPICAL | Status: AC
  Administered 2022-06-12: 1 via TOPICAL

## 2022-06-12 MED ORDER — RADIAPLEXRX EX GEL: Freq: Once | CUTANEOUS | Status: AC

## 2022-06-13 ENCOUNTER — Other Ambulatory Visit: Payer: Self-pay

## 2022-06-13 ENCOUNTER — Ambulatory Visit
Admission: RE | Admit: 2022-06-13 | Discharge: 2022-06-13 | Disposition: A | Discharge status: Home / Self Care | Payer: 59 | Source: Ambulatory Visit | Attending: Radiation Oncology | Admitting: Radiation Oncology

## 2022-06-13 DIAGNOSIS — Z171 Estrogen receptor negative status [ER-]: Secondary | ICD-10-CM | POA: Insufficient documentation

## 2022-06-13 DIAGNOSIS — Z510 Encounter for antineoplastic radiation therapy: Secondary | ICD-10-CM | POA: Insufficient documentation

## 2022-06-13 DIAGNOSIS — C50411 Malignant neoplasm of upper-outer quadrant of right female breast: Secondary | ICD-10-CM | POA: Insufficient documentation

## 2022-06-13 DIAGNOSIS — Z170 Estrogen receptor positive status [ER+]: Secondary | ICD-10-CM | POA: Diagnosis not present

## 2022-06-13 LAB — RAD ONC ARIA SESSION SUMMARY
Course Elapsed Days: 1
Plan Fractions Treated to Date: 2
Plan Prescribed Dose Per Fraction: 2.66 Gy
Plan Total Fractions Prescribed: 16
Plan Total Prescribed Dose: 42.56 Gy
Reference Point Dosage Given to Date: 5.32 Gy
Reference Point Session Dosage Given: 2.66 Gy
Session Number: 2

## 2022-06-16 ENCOUNTER — Other Ambulatory Visit: Payer: Self-pay

## 2022-06-16 ENCOUNTER — Ambulatory Visit
Admission: RE | Admit: 2022-06-16 | Discharge: 2022-06-16 | Disposition: A | Discharge status: Home / Self Care | Payer: 59 | Source: Ambulatory Visit | Attending: Radiation Oncology | Admitting: Radiation Oncology

## 2022-06-16 DIAGNOSIS — C50411 Malignant neoplasm of upper-outer quadrant of right female breast: Secondary | ICD-10-CM | POA: Diagnosis not present

## 2022-06-16 DIAGNOSIS — Z510 Encounter for antineoplastic radiation therapy: Secondary | ICD-10-CM | POA: Diagnosis not present

## 2022-06-16 DIAGNOSIS — Z171 Estrogen receptor negative status [ER-]: Secondary | ICD-10-CM | POA: Diagnosis not present

## 2022-06-16 LAB — RAD ONC ARIA SESSION SUMMARY
Course Elapsed Days: 4
Plan Fractions Treated to Date: 3
Plan Prescribed Dose Per Fraction: 2.66 Gy
Plan Total Fractions Prescribed: 16
Plan Total Prescribed Dose: 42.56 Gy
Reference Point Dosage Given to Date: 7.98 Gy
Reference Point Session Dosage Given: 2.66 Gy
Session Number: 3

## 2022-06-17 ENCOUNTER — Ambulatory Visit
Admission: RE | Admit: 2022-06-17 | Discharge: 2022-06-17 | Disposition: A | Discharge status: Home / Self Care | Payer: 59 | Source: Ambulatory Visit | Attending: Radiation Oncology | Admitting: Radiation Oncology

## 2022-06-17 ENCOUNTER — Other Ambulatory Visit: Payer: Self-pay

## 2022-06-17 ENCOUNTER — Encounter: Payer: Self-pay | Admitting: Hematology

## 2022-06-17 ENCOUNTER — Telehealth: Payer: Self-pay

## 2022-06-17 ENCOUNTER — Telehealth: Payer: Self-pay | Admitting: Internal Medicine

## 2022-06-17 DIAGNOSIS — Z170 Estrogen receptor positive status [ER+]: Secondary | ICD-10-CM | POA: Diagnosis not present

## 2022-06-17 DIAGNOSIS — C50411 Malignant neoplasm of upper-outer quadrant of right female breast: Secondary | ICD-10-CM | POA: Diagnosis not present

## 2022-06-17 DIAGNOSIS — Z171 Estrogen receptor negative status [ER-]: Secondary | ICD-10-CM | POA: Diagnosis not present

## 2022-06-17 DIAGNOSIS — Z510 Encounter for antineoplastic radiation therapy: Secondary | ICD-10-CM | POA: Diagnosis not present

## 2022-06-17 DIAGNOSIS — R600 Localized edema: Secondary | ICD-10-CM

## 2022-06-17 LAB — RAD ONC ARIA SESSION SUMMARY
Course Elapsed Days: 5
Plan Fractions Treated to Date: 4
Plan Prescribed Dose Per Fraction: 2.66 Gy
Plan Total Fractions Prescribed: 16
Plan Total Prescribed Dose: 42.56 Gy
Reference Point Dosage Given to Date: 10.64 Gy
Reference Point Session Dosage Given: 2.66 Gy
Session Number: 4

## 2022-06-17 NOTE — Telephone Encounter (Signed)
Reached out to patient to reschedule , provider going to be out of office. Patient aware of date and time change of appointment.

## 2022-06-17 NOTE — Telephone Encounter (Signed)
Spoke with pt via telephone regarding her MyChart message to Dr. Burr Medico.  Pt stated the nails on her pinkie, ring, and middle fingers have come off.  Pt denied pain in the hands.  Pt's last chemo was in January 2024 and is currently doing daily radiation.  Informed pt that it's nothing that can be done regarding the loss of her fingernails but instructed pt to start taking a vitamin for hair, nails, and skin which contains keratin, vitamin E, and biotin.  Also, instructed pt to apply vitamin E oil to her hands and nail beds daily or twice daily.  Pt verbalized understanding of instructions.  Informed pt that this RN spoke with Cira Rue, NP regarding the swelling in her legs.  Informed pt that Lacie recommends a US Doppler to r/o blood clot.  Pt denied pain, warmth, and redness in the leg.  Pt stated the swelling in only in the lower leg.  Pt stated she cannot come in today for a US Doppler but is willing to come in on Wednesday prior to pt's Radiation appt.  Got pt scheduled for Doppler on 06/18/2022 at 2pm here at [facility].  Notified pt via MyChart per pt's request of Doppler appt on 06/18/2022.  Instructed pt to keep legs elevated as often as possible and to wear compression socks to help move fluid back up to the pt's heart.  Pt verbalized understanding and had no further questions or concerns.

## 2022-06-18 ENCOUNTER — Ambulatory Visit (HOSPITAL_COMMUNITY)
Admission: RE | Admit: 2022-06-18 | Discharge: 2022-06-18 | Disposition: A | Discharge status: Home / Self Care | Payer: 59 | Source: Ambulatory Visit | Attending: Nurse Practitioner | Admitting: Nurse Practitioner

## 2022-06-18 ENCOUNTER — Other Ambulatory Visit: Payer: Self-pay

## 2022-06-18 ENCOUNTER — Ambulatory Visit
Admission: RE | Admit: 2022-06-18 | Discharge: 2022-06-18 | Disposition: A | Discharge status: Home / Self Care | Payer: 59 | Source: Ambulatory Visit | Attending: Radiation Oncology | Admitting: Radiation Oncology

## 2022-06-18 DIAGNOSIS — Z171 Estrogen receptor negative status [ER-]: Secondary | ICD-10-CM | POA: Diagnosis not present

## 2022-06-18 DIAGNOSIS — R600 Localized edema: Secondary | ICD-10-CM

## 2022-06-18 DIAGNOSIS — C50411 Malignant neoplasm of upper-outer quadrant of right female breast: Secondary | ICD-10-CM | POA: Diagnosis not present

## 2022-06-18 DIAGNOSIS — Z510 Encounter for antineoplastic radiation therapy: Secondary | ICD-10-CM | POA: Diagnosis not present

## 2022-06-18 DIAGNOSIS — Z170 Estrogen receptor positive status [ER+]: Secondary | ICD-10-CM | POA: Diagnosis not present

## 2022-06-18 LAB — RAD ONC ARIA SESSION SUMMARY
Course Elapsed Days: 6
Plan Fractions Treated to Date: 5
Plan Prescribed Dose Per Fraction: 2.66 Gy
Plan Total Fractions Prescribed: 16
Plan Total Prescribed Dose: 42.56 Gy
Reference Point Dosage Given to Date: 13.3 Gy
Reference Point Session Dosage Given: 2.66 Gy
Session Number: 5

## 2022-06-18 NOTE — Progress Notes (Signed)
Right lower extremity venous duplex has been completed. Preliminary results can be found in CV Proc through chart review.  Results were given to Cira Rue NP.  06/18/22 1:59 PM Carlos Levering RVT

## 2022-06-19 ENCOUNTER — Ambulatory Visit
Admission: RE | Admit: 2022-06-19 | Discharge: 2022-06-19 | Disposition: A | Discharge status: Home / Self Care | Payer: 59 | Source: Ambulatory Visit | Attending: Radiation Oncology | Admitting: Radiation Oncology

## 2022-06-19 ENCOUNTER — Other Ambulatory Visit: Payer: Self-pay

## 2022-06-19 DIAGNOSIS — Z171 Estrogen receptor negative status [ER-]: Secondary | ICD-10-CM | POA: Diagnosis not present

## 2022-06-19 DIAGNOSIS — Z170 Estrogen receptor positive status [ER+]: Secondary | ICD-10-CM | POA: Diagnosis not present

## 2022-06-19 DIAGNOSIS — Z510 Encounter for antineoplastic radiation therapy: Secondary | ICD-10-CM | POA: Diagnosis not present

## 2022-06-19 DIAGNOSIS — C50411 Malignant neoplasm of upper-outer quadrant of right female breast: Secondary | ICD-10-CM | POA: Diagnosis not present

## 2022-06-19 LAB — RAD ONC ARIA SESSION SUMMARY
Course Elapsed Days: 7
Plan Fractions Treated to Date: 6
Plan Prescribed Dose Per Fraction: 2.66 Gy
Plan Total Fractions Prescribed: 16
Plan Total Prescribed Dose: 42.56 Gy
Reference Point Dosage Given to Date: 15.96 Gy
Reference Point Session Dosage Given: 2.66 Gy
Session Number: 6

## 2022-06-20 ENCOUNTER — Other Ambulatory Visit: Payer: Self-pay

## 2022-06-20 ENCOUNTER — Ambulatory Visit
Admission: RE | Admit: 2022-06-20 | Discharge: 2022-06-20 | Disposition: A | Discharge status: Home / Self Care | Payer: 59 | Source: Ambulatory Visit | Attending: Radiation Oncology | Admitting: Radiation Oncology

## 2022-06-20 DIAGNOSIS — Z170 Estrogen receptor positive status [ER+]: Secondary | ICD-10-CM | POA: Diagnosis not present

## 2022-06-20 DIAGNOSIS — Z171 Estrogen receptor negative status [ER-]: Secondary | ICD-10-CM | POA: Diagnosis not present

## 2022-06-20 DIAGNOSIS — Z510 Encounter for antineoplastic radiation therapy: Secondary | ICD-10-CM | POA: Diagnosis not present

## 2022-06-20 DIAGNOSIS — C50411 Malignant neoplasm of upper-outer quadrant of right female breast: Secondary | ICD-10-CM | POA: Diagnosis not present

## 2022-06-20 LAB — RAD ONC ARIA SESSION SUMMARY
Course Elapsed Days: 8
Plan Fractions Treated to Date: 7
Plan Prescribed Dose Per Fraction: 2.66 Gy
Plan Total Fractions Prescribed: 16
Plan Total Prescribed Dose: 42.56 Gy
Reference Point Dosage Given to Date: 18.62 Gy
Reference Point Session Dosage Given: 2.66 Gy
Session Number: 7

## 2022-06-23 ENCOUNTER — Ambulatory Visit
Admission: RE | Admit: 2022-06-23 | Discharge: 2022-06-23 | Disposition: A | Discharge status: Home / Self Care | Payer: 59 | Source: Ambulatory Visit | Attending: Radiation Oncology | Admitting: Radiation Oncology

## 2022-06-23 ENCOUNTER — Other Ambulatory Visit: Payer: Self-pay

## 2022-06-23 DIAGNOSIS — Z170 Estrogen receptor positive status [ER+]: Secondary | ICD-10-CM | POA: Diagnosis not present

## 2022-06-23 DIAGNOSIS — C50411 Malignant neoplasm of upper-outer quadrant of right female breast: Secondary | ICD-10-CM | POA: Diagnosis not present

## 2022-06-23 DIAGNOSIS — Z510 Encounter for antineoplastic radiation therapy: Secondary | ICD-10-CM | POA: Diagnosis not present

## 2022-06-23 DIAGNOSIS — Z171 Estrogen receptor negative status [ER-]: Secondary | ICD-10-CM | POA: Diagnosis not present

## 2022-06-23 LAB — RAD ONC ARIA SESSION SUMMARY
Course Elapsed Days: 11
Plan Fractions Treated to Date: 8
Plan Prescribed Dose Per Fraction: 2.66 Gy
Plan Total Fractions Prescribed: 16
Plan Total Prescribed Dose: 42.56 Gy
Reference Point Dosage Given to Date: 21.28 Gy
Reference Point Session Dosage Given: 2.66 Gy
Session Number: 8

## 2022-06-24 ENCOUNTER — Ambulatory Visit
Admission: RE | Admit: 2022-06-24 | Discharge: 2022-06-24 | Disposition: A | Discharge status: Home / Self Care | Payer: 59 | Source: Ambulatory Visit | Attending: Radiation Oncology | Admitting: Radiation Oncology

## 2022-06-24 ENCOUNTER — Other Ambulatory Visit: Payer: Self-pay

## 2022-06-24 DIAGNOSIS — Z510 Encounter for antineoplastic radiation therapy: Secondary | ICD-10-CM | POA: Diagnosis not present

## 2022-06-24 DIAGNOSIS — Z171 Estrogen receptor negative status [ER-]: Secondary | ICD-10-CM | POA: Diagnosis not present

## 2022-06-24 DIAGNOSIS — C50411 Malignant neoplasm of upper-outer quadrant of right female breast: Secondary | ICD-10-CM | POA: Diagnosis not present

## 2022-06-24 DIAGNOSIS — Z170 Estrogen receptor positive status [ER+]: Secondary | ICD-10-CM | POA: Diagnosis not present

## 2022-06-24 LAB — RAD ONC ARIA SESSION SUMMARY
Course Elapsed Days: 12
Plan Fractions Treated to Date: 9
Plan Prescribed Dose Per Fraction: 2.66 Gy
Plan Total Fractions Prescribed: 16
Plan Total Prescribed Dose: 42.56 Gy
Reference Point Dosage Given to Date: 23.94 Gy
Reference Point Session Dosage Given: 2.66 Gy
Session Number: 9

## 2022-06-25 ENCOUNTER — Other Ambulatory Visit: Payer: Self-pay

## 2022-06-25 ENCOUNTER — Ambulatory Visit
Admission: RE | Admit: 2022-06-25 | Discharge: 2022-06-25 | Disposition: A | Discharge status: Home / Self Care | Payer: 59 | Source: Ambulatory Visit | Attending: Radiation Oncology | Admitting: Radiation Oncology

## 2022-06-25 DIAGNOSIS — Z510 Encounter for antineoplastic radiation therapy: Secondary | ICD-10-CM | POA: Diagnosis not present

## 2022-06-25 DIAGNOSIS — Z171 Estrogen receptor negative status [ER-]: Secondary | ICD-10-CM | POA: Diagnosis not present

## 2022-06-25 DIAGNOSIS — Z170 Estrogen receptor positive status [ER+]: Secondary | ICD-10-CM | POA: Diagnosis not present

## 2022-06-25 DIAGNOSIS — C50411 Malignant neoplasm of upper-outer quadrant of right female breast: Secondary | ICD-10-CM | POA: Diagnosis not present

## 2022-06-25 LAB — RAD ONC ARIA SESSION SUMMARY
Course Elapsed Days: 13
Plan Fractions Treated to Date: 10
Plan Prescribed Dose Per Fraction: 2.66 Gy
Plan Total Fractions Prescribed: 16
Plan Total Prescribed Dose: 42.56 Gy
Reference Point Dosage Given to Date: 26.6 Gy
Reference Point Session Dosage Given: 2.66 Gy
Session Number: 10

## 2022-06-26 ENCOUNTER — Other Ambulatory Visit: Payer: Self-pay

## 2022-06-26 ENCOUNTER — Ambulatory Visit
Admission: RE | Admit: 2022-06-26 | Discharge: 2022-06-26 | Disposition: A | Discharge status: Home / Self Care | Payer: 59 | Source: Ambulatory Visit | Attending: Radiation Oncology | Admitting: Radiation Oncology

## 2022-06-26 DIAGNOSIS — Z510 Encounter for antineoplastic radiation therapy: Secondary | ICD-10-CM | POA: Diagnosis not present

## 2022-06-26 DIAGNOSIS — C50411 Malignant neoplasm of upper-outer quadrant of right female breast: Secondary | ICD-10-CM | POA: Diagnosis not present

## 2022-06-26 DIAGNOSIS — Z171 Estrogen receptor negative status [ER-]: Secondary | ICD-10-CM | POA: Diagnosis not present

## 2022-06-26 DIAGNOSIS — Z170 Estrogen receptor positive status [ER+]: Secondary | ICD-10-CM | POA: Diagnosis not present

## 2022-06-26 LAB — RAD ONC ARIA SESSION SUMMARY
Course Elapsed Days: 14
Plan Fractions Treated to Date: 11
Plan Prescribed Dose Per Fraction: 2.66 Gy
Plan Total Fractions Prescribed: 16
Plan Total Prescribed Dose: 42.56 Gy
Reference Point Dosage Given to Date: 29.26 Gy
Reference Point Session Dosage Given: 2.66 Gy
Session Number: 11

## 2022-06-27 ENCOUNTER — Other Ambulatory Visit: Payer: Self-pay

## 2022-06-27 ENCOUNTER — Ambulatory Visit
Admission: RE | Admit: 2022-06-27 | Discharge: 2022-06-27 | Disposition: A | Discharge status: Home / Self Care | Payer: 59 | Source: Ambulatory Visit | Attending: Radiation Oncology | Admitting: Radiation Oncology

## 2022-06-27 ENCOUNTER — Ambulatory Visit: Payer: 59

## 2022-06-27 DIAGNOSIS — Z510 Encounter for antineoplastic radiation therapy: Secondary | ICD-10-CM | POA: Diagnosis not present

## 2022-06-27 DIAGNOSIS — Z170 Estrogen receptor positive status [ER+]: Secondary | ICD-10-CM | POA: Diagnosis not present

## 2022-06-27 DIAGNOSIS — C50411 Malignant neoplasm of upper-outer quadrant of right female breast: Secondary | ICD-10-CM | POA: Diagnosis not present

## 2022-06-27 DIAGNOSIS — Z171 Estrogen receptor negative status [ER-]: Secondary | ICD-10-CM | POA: Diagnosis not present

## 2022-06-27 LAB — RAD ONC ARIA SESSION SUMMARY
Course Elapsed Days: 15
Plan Fractions Treated to Date: 12
Plan Prescribed Dose Per Fraction: 2.66 Gy
Plan Total Fractions Prescribed: 16
Plan Total Prescribed Dose: 42.56 Gy
Reference Point Dosage Given to Date: 31.92 Gy
Reference Point Session Dosage Given: 2.66 Gy
Session Number: 12

## 2022-06-30 ENCOUNTER — Ambulatory Visit
Admission: RE | Admit: 2022-06-30 | Discharge: 2022-06-30 | Disposition: A | Discharge status: Home / Self Care | Payer: 59 | Source: Ambulatory Visit | Attending: Radiation Oncology | Admitting: Radiation Oncology

## 2022-06-30 ENCOUNTER — Other Ambulatory Visit: Payer: Self-pay

## 2022-06-30 DIAGNOSIS — Z170 Estrogen receptor positive status [ER+]: Secondary | ICD-10-CM | POA: Diagnosis not present

## 2022-06-30 DIAGNOSIS — Z171 Estrogen receptor negative status [ER-]: Secondary | ICD-10-CM | POA: Diagnosis not present

## 2022-06-30 DIAGNOSIS — Z510 Encounter for antineoplastic radiation therapy: Secondary | ICD-10-CM | POA: Diagnosis not present

## 2022-06-30 DIAGNOSIS — C50411 Malignant neoplasm of upper-outer quadrant of right female breast: Secondary | ICD-10-CM | POA: Diagnosis not present

## 2022-06-30 LAB — RAD ONC ARIA SESSION SUMMARY
Course Elapsed Days: 18
Plan Fractions Treated to Date: 13
Plan Prescribed Dose Per Fraction: 2.66 Gy
Plan Total Fractions Prescribed: 16
Plan Total Prescribed Dose: 42.56 Gy
Reference Point Dosage Given to Date: 34.58 Gy
Reference Point Session Dosage Given: 2.66 Gy
Session Number: 13

## 2022-07-01 ENCOUNTER — Other Ambulatory Visit: Payer: Self-pay

## 2022-07-01 ENCOUNTER — Ambulatory Visit
Admission: RE | Admit: 2022-07-01 | Discharge: 2022-07-01 | Disposition: A | Discharge status: Home / Self Care | Payer: 59 | Source: Ambulatory Visit | Attending: Radiation Oncology | Admitting: Radiation Oncology

## 2022-07-01 DIAGNOSIS — C50411 Malignant neoplasm of upper-outer quadrant of right female breast: Secondary | ICD-10-CM | POA: Diagnosis not present

## 2022-07-01 DIAGNOSIS — Z170 Estrogen receptor positive status [ER+]: Secondary | ICD-10-CM | POA: Diagnosis not present

## 2022-07-01 DIAGNOSIS — Z510 Encounter for antineoplastic radiation therapy: Secondary | ICD-10-CM | POA: Diagnosis not present

## 2022-07-01 DIAGNOSIS — Z171 Estrogen receptor negative status [ER-]: Secondary | ICD-10-CM | POA: Diagnosis not present

## 2022-07-01 LAB — RAD ONC ARIA SESSION SUMMARY
Course Elapsed Days: 19
Plan Fractions Treated to Date: 14
Plan Prescribed Dose Per Fraction: 2.66 Gy
Plan Total Fractions Prescribed: 16
Plan Total Prescribed Dose: 42.56 Gy
Reference Point Dosage Given to Date: 37.24 Gy
Reference Point Session Dosage Given: 2.66 Gy
Session Number: 14

## 2022-07-02 ENCOUNTER — Other Ambulatory Visit: Payer: Self-pay

## 2022-07-02 ENCOUNTER — Ambulatory Visit
Admission: RE | Admit: 2022-07-02 | Discharge: 2022-07-02 | Disposition: A | Discharge status: Home / Self Care | Payer: 59 | Source: Ambulatory Visit | Attending: Radiation Oncology | Admitting: Radiation Oncology

## 2022-07-02 DIAGNOSIS — Z510 Encounter for antineoplastic radiation therapy: Secondary | ICD-10-CM | POA: Diagnosis not present

## 2022-07-02 DIAGNOSIS — C50411 Malignant neoplasm of upper-outer quadrant of right female breast: Secondary | ICD-10-CM | POA: Diagnosis not present

## 2022-07-02 DIAGNOSIS — Z171 Estrogen receptor negative status [ER-]: Secondary | ICD-10-CM | POA: Diagnosis not present

## 2022-07-02 DIAGNOSIS — Z170 Estrogen receptor positive status [ER+]: Secondary | ICD-10-CM | POA: Diagnosis not present

## 2022-07-02 LAB — RAD ONC ARIA SESSION SUMMARY
Course Elapsed Days: 20
Plan Fractions Treated to Date: 15
Plan Prescribed Dose Per Fraction: 2.66 Gy
Plan Total Fractions Prescribed: 16
Plan Total Prescribed Dose: 42.56 Gy
Reference Point Dosage Given to Date: 39.9 Gy
Reference Point Session Dosage Given: 2.66 Gy
Session Number: 15

## 2022-07-03 ENCOUNTER — Other Ambulatory Visit: Payer: Self-pay

## 2022-07-03 ENCOUNTER — Ambulatory Visit
Admission: RE | Admit: 2022-07-03 | Discharge: 2022-07-03 | Disposition: A | Discharge status: Home / Self Care | Payer: 59 | Source: Ambulatory Visit | Attending: Radiation Oncology | Admitting: Radiation Oncology

## 2022-07-03 DIAGNOSIS — C50411 Malignant neoplasm of upper-outer quadrant of right female breast: Secondary | ICD-10-CM | POA: Diagnosis not present

## 2022-07-03 DIAGNOSIS — Z510 Encounter for antineoplastic radiation therapy: Secondary | ICD-10-CM | POA: Diagnosis not present

## 2022-07-03 DIAGNOSIS — Z170 Estrogen receptor positive status [ER+]: Secondary | ICD-10-CM | POA: Diagnosis not present

## 2022-07-03 DIAGNOSIS — Z171 Estrogen receptor negative status [ER-]: Secondary | ICD-10-CM | POA: Diagnosis not present

## 2022-07-03 LAB — RAD ONC ARIA SESSION SUMMARY
Course Elapsed Days: 21
Plan Fractions Treated to Date: 16
Plan Prescribed Dose Per Fraction: 2.66 Gy
Plan Total Fractions Prescribed: 16
Plan Total Prescribed Dose: 42.56 Gy
Reference Point Dosage Given to Date: 42.56 Gy
Reference Point Session Dosage Given: 2.66 Gy
Session Number: 16

## 2022-07-04 ENCOUNTER — Ambulatory Visit
Admission: RE | Admit: 2022-07-04 | Discharge: 2022-07-04 | Disposition: A | Discharge status: Home / Self Care | Payer: 59 | Source: Ambulatory Visit | Attending: Radiation Oncology | Admitting: Radiation Oncology

## 2022-07-04 ENCOUNTER — Other Ambulatory Visit: Payer: Self-pay

## 2022-07-04 DIAGNOSIS — C50411 Malignant neoplasm of upper-outer quadrant of right female breast: Secondary | ICD-10-CM | POA: Diagnosis not present

## 2022-07-04 DIAGNOSIS — Z510 Encounter for antineoplastic radiation therapy: Secondary | ICD-10-CM | POA: Diagnosis not present

## 2022-07-04 DIAGNOSIS — Z171 Estrogen receptor negative status [ER-]: Secondary | ICD-10-CM | POA: Diagnosis not present

## 2022-07-04 DIAGNOSIS — Z170 Estrogen receptor positive status [ER+]: Secondary | ICD-10-CM | POA: Diagnosis not present

## 2022-07-04 LAB — RAD ONC ARIA SESSION SUMMARY
Course Elapsed Days: 22
Plan Fractions Treated to Date: 1
Plan Prescribed Dose Per Fraction: 2 Gy
Plan Total Fractions Prescribed: 4
Plan Total Prescribed Dose: 8 Gy
Reference Point Dosage Given to Date: 2 Gy
Reference Point Session Dosage Given: 2 Gy
Session Number: 17

## 2022-07-07 ENCOUNTER — Other Ambulatory Visit: Payer: Self-pay

## 2022-07-07 ENCOUNTER — Ambulatory Visit
Admission: RE | Admit: 2022-07-07 | Discharge: 2022-07-07 | Disposition: A | Discharge status: Home / Self Care | Payer: 59 | Source: Ambulatory Visit | Attending: Radiation Oncology | Admitting: Radiation Oncology

## 2022-07-07 DIAGNOSIS — C50411 Malignant neoplasm of upper-outer quadrant of right female breast: Secondary | ICD-10-CM | POA: Diagnosis not present

## 2022-07-07 DIAGNOSIS — Z510 Encounter for antineoplastic radiation therapy: Secondary | ICD-10-CM | POA: Diagnosis not present

## 2022-07-07 DIAGNOSIS — Z171 Estrogen receptor negative status [ER-]: Secondary | ICD-10-CM | POA: Diagnosis not present

## 2022-07-07 LAB — RAD ONC ARIA SESSION SUMMARY
Course Elapsed Days: 25
Plan Fractions Treated to Date: 2
Plan Prescribed Dose Per Fraction: 2 Gy
Plan Total Fractions Prescribed: 4
Plan Total Prescribed Dose: 8 Gy
Reference Point Dosage Given to Date: 4 Gy
Reference Point Session Dosage Given: 2 Gy
Session Number: 18

## 2022-07-08 ENCOUNTER — Ambulatory Visit
Admission: RE | Admit: 2022-07-08 | Discharge: 2022-07-08 | Disposition: A | Discharge status: Home / Self Care | Payer: 59 | Source: Ambulatory Visit | Attending: Radiation Oncology | Admitting: Radiation Oncology

## 2022-07-08 ENCOUNTER — Encounter: Payer: Self-pay | Admitting: *Deleted

## 2022-07-08 ENCOUNTER — Other Ambulatory Visit: Payer: Self-pay

## 2022-07-08 DIAGNOSIS — Z510 Encounter for antineoplastic radiation therapy: Secondary | ICD-10-CM | POA: Diagnosis not present

## 2022-07-08 DIAGNOSIS — Z171 Estrogen receptor negative status [ER-]: Secondary | ICD-10-CM

## 2022-07-08 DIAGNOSIS — C50411 Malignant neoplasm of upper-outer quadrant of right female breast: Secondary | ICD-10-CM | POA: Diagnosis not present

## 2022-07-08 LAB — RAD ONC ARIA SESSION SUMMARY
Course Elapsed Days: 26
Plan Fractions Treated to Date: 3
Plan Prescribed Dose Per Fraction: 2 Gy
Plan Total Fractions Prescribed: 4
Plan Total Prescribed Dose: 8 Gy
Reference Point Dosage Given to Date: 6 Gy
Reference Point Session Dosage Given: 2 Gy
Session Number: 19

## 2022-07-09 ENCOUNTER — Ambulatory Visit
Admission: RE | Admit: 2022-07-09 | Discharge: 2022-07-09 | Disposition: A | Discharge status: Home / Self Care | Payer: 59 | Source: Ambulatory Visit | Attending: Radiation Oncology | Admitting: Radiation Oncology

## 2022-07-09 ENCOUNTER — Encounter: Payer: Self-pay | Admitting: Radiation Oncology

## 2022-07-09 ENCOUNTER — Other Ambulatory Visit: Payer: Self-pay

## 2022-07-09 DIAGNOSIS — Z510 Encounter for antineoplastic radiation therapy: Secondary | ICD-10-CM | POA: Diagnosis not present

## 2022-07-09 DIAGNOSIS — C50411 Malignant neoplasm of upper-outer quadrant of right female breast: Secondary | ICD-10-CM | POA: Diagnosis not present

## 2022-07-09 DIAGNOSIS — Z170 Estrogen receptor positive status [ER+]: Secondary | ICD-10-CM | POA: Diagnosis not present

## 2022-07-09 DIAGNOSIS — Z171 Estrogen receptor negative status [ER-]: Secondary | ICD-10-CM | POA: Diagnosis not present

## 2022-07-09 LAB — RAD ONC ARIA SESSION SUMMARY
Course Elapsed Days: 27
Plan Fractions Treated to Date: 4
Plan Prescribed Dose Per Fraction: 2 Gy
Plan Total Fractions Prescribed: 4
Plan Total Prescribed Dose: 8 Gy
Reference Point Dosage Given to Date: 8 Gy
Reference Point Session Dosage Given: 2 Gy
Session Number: 20

## 2022-07-10 ENCOUNTER — Ambulatory Visit: Payer: Managed Care, Other (non HMO) | Admitting: Hematology

## 2022-07-11 ENCOUNTER — Encounter: Payer: Self-pay | Admitting: Hematology

## 2022-07-14 ENCOUNTER — Encounter: Payer: Self-pay | Admitting: Hematology

## 2022-07-14 NOTE — Progress Notes (Signed)
  Radiation Oncology         [phone] ________________________________  Name: Samantha [name] MRN: [id number]  Date: 07/09/2022  DOB: 1961-03-02  End of Treatment Note  Diagnosis:  Stage IB, pT1bN0M0, grade 3 triple negative invasive ductal carcinoma of the right breast   Indication for treatment:  Curative       Radiation treatment dates:   06/12/22-07/09/22  Site/dose:   The patient initially received a dose of 42.56 Gy in 16 fractions to the breast using whole-breast tangent fields. This was delivered using a 3-D conformal technique. The patient then received a boost to the seroma. This delivered an additional 8 Gy in 98fractions using a 3 field photon technique due to the depth of the seroma. The total dose was 50.56 Gy.  Narrative: The patient tolerated radiation treatment relatively well. She developed fatigue and anticipated skin changes in the treatment field.   Plan: The patient will receive a call in about one month from the radiation oncology department. She will continue follow up with Dr. Burr Medico as well.       Carola Rhine, PAC

## 2022-07-15 NOTE — Assessment & Plan Note (Signed)
IDC and DCIS, Stage IA, pT1b, cN0, triple negative, Grade 3 -found on screening mammogram. S/p lumpectomy on 01/08/22 with Dr. Barry Dienes, path showed: 8 mm invasive and in situ ductal carcinoma with negative margins. Repeat prognostic panel confirmed triple negative disease.  -lymph node biopsies 01/28/22 showed negative nodes (0/2). Port placed at time of procedure. Postoperative course complicated by pneumothorax, which has resolved. -she began adjuvant TC on 02/20/22, and completed planned 4 cycles on 04/24/2022. -she completed adjuvant RT on 07/09/2022  -we discussed cancer surveillance today and what to watch at home

## 2022-07-15 NOTE — Progress Notes (Unsigned)
[place]   [phone] [phone]   Clinic Follow up Note   Patient Care Team: Crist Infante, MD as PCP - General (Internal Medicine) Mauro Kaufmann, RN as Oncology Nurse Navigator [name], RN as Oncology Nurse Navigator Stark Klein, MD as Consulting Physician (General Surgery) Truitt Merle, MD as Consulting Physician (Hematology) Kyung Rudd, MD as Consulting Physician (Radiation Oncology) Marcene Corning, MD as Consulting Physician (Endocrinology) Stefanie Libel, MD as Consulting Physician (Sports Medicine) Marylynn Pearson, MD as Consulting Physician (Obstetrics and Gynecology)  Date of Service:  07/16/2022  CHIEF COMPLAINT: f/u of  right breast cancer     CURRENT THERAPY: Surveillance    ASSESSMENT:  Samantha [name] is a 62 y.o. female with   Malignant neoplasm of upper-outer quadrant of right breast in female, estrogen receptor negative ([place]) IDC and DCIS, Stage IA, pT1b, cN0, triple negative, Grade 3 -found on screening mammogram. S/p lumpectomy on 01/08/22 with Dr. Barry Dienes, path showed: 8 mm invasive and in situ ductal carcinoma with negative margins. Repeat prognostic panel confirmed triple negative disease.  -lymph node biopsies 01/28/22 showed negative nodes (0/2). Port placed at time of procedure. Postoperative course complicated by pneumothorax, which has resolved. -she began adjuvant TC on 02/20/22, and completed planned 4 cycles on 04/24/2022. -she completed adjuvant RT on 07/09/2022  -we discussed cancer surveillance today and what to watch at home    PLAN: -encourage pt to exercise to help with fatigue in legs, I made PT referral also  -discuss cancer surveillance -high protein diet. -lab today -lab and survivorship with NP Lacie in 3 months     SUMMARY OF ONCOLOGIC HISTORY: Oncology History Overview Note   Cancer Staging  Malignant neoplasm of upper-outer quadrant of right breast in female, estrogen receptor  negative Staging form: Breast, AJCC 8th Edition - Clinical stage from 12/23/2021: cT1b, cM0, G3, ER-, PR-, HER2- - Unsigned Stage prefix: Initial diagnosis Histologic grading system: 3 grade system - Pathologic stage from 01/08/2022: Stage IB (pT1b, pN0, cM0, G3, ER-, PR-, HER2-) - Signed by Truitt Merle, MD on 01/24/2022 Stage prefix: Initial diagnosis Histologic grading system: 3 grade system Residual tumor (R): R0 - None     Malignant neoplasm of upper-outer quadrant of right breast in female, estrogen receptor negative  12/06/2021 Mammogram   CLINICAL DATA:  Patient returns today to evaluate RIGHT breast calcifications identified on a recent screening mammogram.   EXAM: DIGITAL DIAGNOSTIC UNILATERAL RIGHT MAMMOGRAM  IMPRESSION: Grouped coarse heterogeneous calcifications within the upper-outer quadrant of the RIGHT breast, measuring 6 mm extent. These may be fibroadenomatous calcifications. Stereotactic biopsy is recommended to exclude malignancy.   12/18/2021 Initial Biopsy   Diagnosis Breast, right, needle core biopsy, upper outer quadrant, x clip - DUCTAL CARCINOMA IN SITU, SOLID AND CRIBRIFORM TYPE WITH COMEDONECROSIS AND ASSOCIATED CALCIFICATIONS, NUCLEAR GRADE 3 OF 3 - FOCAL MICROINVASION IS PRESENT (LESS THAN 1 MM) WITH EVIDENCE OF LYMPHOVASCULAR INVASION - NECROSIS: PRESENT - CALCIFICATIONS: PRESENT - DCIS LENGTH: 7 MM IN GREATEST LINEAR DIMENSION ON FRAGMENTED CORES  PROGNOSTIC INDICATORS Results: IMMUNOHISTOCHEMICAL AND MORPHOMETRIC ANALYSIS PERFORMED MANUALLY The tumor cells are NEGATIVE for Her2 (1+). Estrogen Receptor: 0%, NEGATIVE Progesterone Receptor: 0%, NEGATIVE Proliferation Marker Ki67: 60%   12/23/2021 Initial Diagnosis   Malignant neoplasm of upper-outer quadrant of right breast in female, estrogen receptor negative ([place])   12/23/2021 Imaging   EXAM: ULTRASOUND OF THE RIGHT AXILLA  IMPRESSION: No abnormal appearing RIGHT axillary lymph nodes.    01/08/2022 Cancer  Staging   Staging form: Breast, AJCC 8th Edition - Pathologic stage from 01/08/2022: Stage IB (pT1b, pN0, cM0, G3, ER-, PR-, HER2-) - Signed by Truitt Merle, MD on 01/24/2022 Stage prefix: Initial diagnosis Histologic grading system: 3 grade system Residual tumor (R): R0 - None   02/20/2022 - 04/24/2022 Chemotherapy   Patient is on Treatment Plan : BREAST TC q21d        INTERVAL HISTORY:  Samantha [name] is here for a follow up of  right breast cancer. She was last seen by me on 04/24/2022 She presents to the clinic alone. Pt state that she is recovering from chemo and radiation. Pt state that she has chemo brain. Pt state she has some fatigue in her legs.    All other systems were reviewed with the patient and are negative.  MEDICAL HISTORY:  Past Medical History:  Diagnosis Date   Allergy    Anxiety 2015   Result of culmination of super stressful caregiving and deaths of 2 immediate family members.  Overall do pretty well.   Cancer 2023   right breast   Depression    GERD (gastroesophageal reflux disease)    Hypothyroidism    Right carpal tunnel syndrome 09/19/2019   Thyroid disease     SURGICAL HISTORY: Past Surgical History:  Procedure Laterality Date   BREAST LUMPECTOMY WITH RADIOACTIVE SEED LOCALIZATION Right 01/08/2022   Procedure: RIGHT BREAST LUMPECTOMY WITH RADIOACTIVE SEED LOCALIZATION;  Surgeon: Stark Klein, MD;  Location: [place];  Service: General;  Laterality: Right;   PORT-A-CATH REMOVAL Left 05/20/2022   Procedure: REMOVAL PORT-A-CATH;  Surgeon: Stark Klein, MD;  Location: [place];  Service: General;  Laterality: Left;   PORTACATH PLACEMENT Left 01/28/2022   Procedure: INSERTION PORT-A-CATH;  Surgeon: Stark Klein, MD;  Location: [place];  Service: General;  Laterality: Left;   SENTINEL NODE BIOPSY Right 01/28/2022   Procedure: RIGHT SENTINEL LYMPH NODE BIOPSY;  Surgeon: Stark Klein, MD;  Location: [place];  Service: General;  Laterality: Right;   TONSILLECTOMY      I have reviewed the social history and family history with the patient and they are unchanged from previous note.  ALLERGIES:  is allergic to nortriptyline, singulair [montelukast sodium], sulfonamide derivatives, amitriptyline hcl, esomeprazole, gabapentin, lexapro [escitalopram], and neosporin [bacitracin-polymyxin b].  MEDICATIONS:  Current Outpatient Medications  Medication Sig Dispense Refill   acetaminophen (TYLENOL) 500 MG tablet Take 1,000 mg by mouth every 6 (six) hours as needed for moderate pain.     Calcium Carb-Cholecalciferol (CALCIUM-VITAMIN D3) 250-125 MG-UNIT TABS Take 1 tablet once a day     Cholecalciferol (VITAMIN D3) 1000 units CAPS Take 1,000 Units by mouth daily.     EPINEPHrine 0.3 mg/0.3 mL IJ SOAJ injection Inject 0.3 mg into the muscle as needed for anaphylaxis. 2 each 1   famotidine (PEPCID) 20 MG tablet Take 1 tablet (20 mg total) by mouth daily.     hydrocortisone 1 % lotion Apply 1 Application topically 2 (two) times daily. 118 mL 0   LORazepam (ATIVAN) 0.5 MG tablet Take 0.25-0.5 mg by mouth 2 (two) times daily as needed for anxiety.     sertraline (ZOLOFT) 50 MG tablet Take 50 mg by mouth daily.     TIROSINT 88 MCG CAPS Take 88 mcg by mouth See admin instructions. Take 88 mcg by mouth daily Monday-Saturday  10   No current facility-administered medications for this visit.    PHYSICAL EXAMINATION: ECOG  PERFORMANCE STATUS: 1 - Symptomatic but completely ambulatory  Vitals:   07/16/22 1040  BP: 125/68  Pulse: 80  Resp: 16  Temp: 98.2 F (36.8 C)  SpO2: 96%   Wt Readings from Last 3 Encounters:  07/16/22 173 lb 11.2 oz (78.8 kg)  05/29/22 176 lb 3.2 oz (79.9 kg)  05/20/22 174 lb 2.6 oz (79 kg)    GENERAL:alert, no distress and comfortable SKIN: skin color normal, no rashes or significant lesions EYES: normal, Conjunctiva are pink and non-injected, sclera clear  NEURO:  alert & oriented x 3 with fluent speech    LABORATORY DATA:  I have reviewed the data as listed    Latest Ref Rng & Units 07/16/2022   11:24 AM 05/05/2022    1:42 PM 04/24/2022    1:02 PM  CBC  WBC 4.0 - 10.5 K/uL 4.7  68.8  16.4   Hemoglobin 12.0 - 15.0 g/dL 14.2  10.3  12.1   Hematocrit 36.0 - 46.0 % 43.4  31.3  36.5   Platelets 150 - 400 K/uL 239  273  410         Latest Ref Rng & Units 07/16/2022   11:24 AM 05/05/2022    1:42 PM 04/24/2022    1:02 PM  CMP  Glucose 70 - 99 mg/dL 109  103  127   BUN 8 - 23 mg/dL 14  8  13    Creatinine 0.44 - 1.00 mg/dL 0.56  0.70  0.47   Sodium 135 - 145 mmol/L 139  138  140   Potassium 3.5 - 5.1 mmol/L 3.9  3.6  3.8   Chloride 98 - 111 mmol/L 105  104  107   CO2 22 - 32 mmol/L 28  27  27    Calcium 8.9 - 10.3 mg/dL 9.6  9.0  9.3   Total Protein 6.5 - 8.1 g/dL 7.5  5.9  6.9   Total Bilirubin 0.3 - 1.2 mg/dL 0.4  0.4  0.3   Alkaline Phos 38 - 126 U/L 107  193  78   AST 15 - 41 U/L 20  27  17    ALT 0 - 44 U/L 18  22  19        RADIOGRAPHIC STUDIES: I have personally reviewed the radiological images as listed and agreed with the findings in the report. No results found.    Orders Placed This Encounter  Procedures   Ambulatory referral to Physical Therapy    Referral Priority:   Routine    Referral Type:   Physical Medicine    Referral Reason:   Specialty Services Required    Requested Specialty:   Physical Therapy    Number of Visits Requested:   1   All questions were answered. The patient knows to call the clinic with any problems, questions or concerns. No barriers to learning was detected. The total time spent in the appointment was 25 minutes.     Truitt Merle, MD 07/16/2022   Felicity Coyer, CMA, am acting as scribe for Truitt Merle, MD.   I have reviewed the above documentation for accuracy and completeness, and I agree with the above.

## 2022-07-16 ENCOUNTER — Inpatient Hospital Stay: Payer: 59 | Attending: Hematology | Admitting: Hematology

## 2022-07-16 ENCOUNTER — Encounter: Payer: Self-pay | Admitting: Hematology

## 2022-07-16 ENCOUNTER — Inpatient Hospital Stay: Payer: 59

## 2022-07-16 VITALS — BP 125/68 | HR 80 | Temp 98.2°F | Resp 16 | Wt 173.7 lb

## 2022-07-16 DIAGNOSIS — R5383 Other fatigue: Secondary | ICD-10-CM | POA: Insufficient documentation

## 2022-07-16 DIAGNOSIS — Z888 Allergy status to other drugs, medicaments and biological substances status: Secondary | ICD-10-CM | POA: Insufficient documentation

## 2022-07-16 DIAGNOSIS — Z882 Allergy status to sulfonamides status: Secondary | ICD-10-CM | POA: Diagnosis not present

## 2022-07-16 DIAGNOSIS — Z171 Estrogen receptor negative status [ER-]: Secondary | ICD-10-CM | POA: Insufficient documentation

## 2022-07-16 DIAGNOSIS — C50411 Malignant neoplasm of upper-outer quadrant of right female breast: Secondary | ICD-10-CM | POA: Diagnosis not present

## 2022-07-16 DIAGNOSIS — Z79899 Other long term (current) drug therapy: Secondary | ICD-10-CM | POA: Diagnosis not present

## 2022-07-16 LAB — CBC WITH DIFFERENTIAL (CANCER CENTER ONLY)
Abs Immature Granulocytes: 0.01 10*3/uL (ref 0.00–0.07)
Basophils Absolute: 0.1 10*3/uL (ref 0.0–0.1)
Basophils Relative: 1 %
Eosinophils Absolute: 0.1 10*3/uL (ref 0.0–0.5)
Eosinophils Relative: 1 %
HCT: 43.4 % (ref 36.0–46.0)
Hemoglobin: 14.2 g/dL (ref 12.0–15.0)
Immature Granulocytes: 0 %
Lymphocytes Relative: 28 %
Lymphs Abs: 1.3 10*3/uL (ref 0.7–4.0)
MCH: 29.4 pg (ref 26.0–34.0)
MCHC: 32.7 g/dL (ref 30.0–36.0)
MCV: 89.9 fL (ref 80.0–100.0)
Monocytes Absolute: 0.5 10*3/uL (ref 0.1–1.0)
Monocytes Relative: 11 %
Neutro Abs: 2.8 10*3/uL (ref 1.7–7.7)
Neutrophils Relative %: 59 %
Platelet Count: 239 10*3/uL (ref 150–400)
RBC: 4.83 MIL/uL (ref 3.87–5.11)
RDW: 13.9 % (ref 11.5–15.5)
WBC Count: 4.7 10*3/uL (ref 4.0–10.5)
nRBC: 0 % (ref 0.0–0.2)

## 2022-07-16 LAB — CMP (CANCER CENTER ONLY)
ALT: 18 U/L (ref 0–44)
AST: 20 U/L (ref 15–41)
Albumin: 4.2 g/dL (ref 3.5–5.0)
Alkaline Phosphatase: 107 U/L (ref 38–126)
Anion gap: 6 (ref 5–15)
BUN: 14 mg/dL (ref 8–23)
CO2: 28 mmol/L (ref 22–32)
Calcium: 9.6 mg/dL (ref 8.9–10.3)
Chloride: 105 mmol/L (ref 98–111)
Creatinine: 0.56 mg/dL (ref 0.44–1.00)
GFR, Estimated: >60 mL/min (ref >60)
Glucose, Bld: 109 mg/dL — ABNORMAL HIGH (ref 70–99)
Potassium: 3.9 mmol/L (ref 3.5–5.1)
Sodium: 139 mmol/L (ref 135–145)
Total Bilirubin: 0.4 mg/dL (ref 0.3–1.2)
Total Protein: 7.5 g/dL (ref 6.5–8.1)
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
## 2022-07-22 ENCOUNTER — Ambulatory Visit: Payer: 59 | Attending: Hematology

## 2022-07-22 ENCOUNTER — Other Ambulatory Visit: Payer: Self-pay

## 2022-07-22 DIAGNOSIS — G8929 Other chronic pain: Secondary | ICD-10-CM | POA: Diagnosis not present

## 2022-07-22 DIAGNOSIS — M25611 Stiffness of right shoulder, not elsewhere classified: Secondary | ICD-10-CM | POA: Diagnosis not present

## 2022-07-22 DIAGNOSIS — M6281 Muscle weakness (generalized): Secondary | ICD-10-CM | POA: Diagnosis not present

## 2022-07-22 DIAGNOSIS — Z171 Estrogen receptor negative status [ER-]: Secondary | ICD-10-CM | POA: Insufficient documentation

## 2022-07-22 DIAGNOSIS — M5442 Lumbago with sciatica, left side: Secondary | ICD-10-CM | POA: Diagnosis not present

## 2022-07-22 DIAGNOSIS — C50411 Malignant neoplasm of upper-outer quadrant of right female breast: Secondary | ICD-10-CM

## 2022-07-28 ENCOUNTER — Ambulatory Visit: Payer: 59

## 2022-07-28 DIAGNOSIS — C50411 Malignant neoplasm of upper-outer quadrant of right female breast: Secondary | ICD-10-CM | POA: Diagnosis not present

## 2022-07-28 DIAGNOSIS — M5442 Lumbago with sciatica, left side: Secondary | ICD-10-CM | POA: Diagnosis not present

## 2022-07-28 DIAGNOSIS — M25611 Stiffness of right shoulder, not elsewhere classified: Secondary | ICD-10-CM

## 2022-07-28 DIAGNOSIS — Z171 Estrogen receptor negative status [ER-]: Secondary | ICD-10-CM | POA: Diagnosis not present

## 2022-07-28 DIAGNOSIS — M6281 Muscle weakness (generalized): Secondary | ICD-10-CM

## 2022-07-28 DIAGNOSIS — G8929 Other chronic pain: Secondary | ICD-10-CM | POA: Diagnosis not present

## 2022-07-28 NOTE — Therapy (Signed)
OUTPATIENT PHYSICAL THERAPY ONCOLOGY TREATMENT  Patient Name: Samantha [name] MRN: [id number] DOB:1960/07/11, 62 y.o., female Today's Date: 07/28/2022  END OF SESSION:  PT End of Session - 07/28/22 1409     Visit Number 2    Number of Visits 12    Date for PT Re-Evaluation 09/02/22    PT Start Time 1405    PT Stop Time 1503    PT Time Calculation (min) 58 min    Activity Tolerance Patient tolerated treatment well    Behavior During Therapy [facility] for tasks assessed/performed             Past Medical History:  Diagnosis Date   Allergy    Anxiety 2015   Result of culmination of super stressful caregiving and deaths of 2 immediate family members.  Overall do pretty well.   Cancer 2023   right breast   Depression    GERD (gastroesophageal reflux disease)    Hypothyroidism    Right carpal tunnel syndrome 09/19/2019   Thyroid disease    Past Surgical History:  Procedure Laterality Date   BREAST LUMPECTOMY WITH RADIOACTIVE SEED LOCALIZATION Right 01/08/2022   Procedure: RIGHT BREAST LUMPECTOMY WITH RADIOACTIVE SEED LOCALIZATION;  Surgeon: [name], MD;  Location: [facility];  Service: General;  Laterality: Right;   PORT-A-CATH REMOVAL Left 05/20/2022   Procedure: REMOVAL PORT-A-CATH;  Surgeon: [name], MD;  Location: [facility];  Service: General;  Laterality: Left;   PORTACATH PLACEMENT Left 01/28/2022   Procedure: INSERTION PORT-A-CATH;  Surgeon: [name], MD;  Location: [facility];  Service: General;  Laterality: Left;   SENTINEL NODE BIOPSY Right 01/28/2022   Procedure: RIGHT SENTINEL LYMPH NODE BIOPSY;  Surgeon: [name], MD;  Location: [facility];  Service: General;  Laterality: Right;   TONSILLECTOMY     Patient Active Problem List   Diagnosis Date Noted   Port-A-Cath in place 02/27/2022   Malignant neoplasm of upper-outer quadrant of right breast in female, estrogen receptor negative 12/23/2021   Radiculopathy  of lumbar region 09/26/2020   Strain of right tibialis anterior muscle 09/26/2020   Carpal tunnel syndrome 09/19/2019   Thoracic disc disorder 02/04/2018   Inflammatory heel pain 02/22/2016   Osteoarthritis, hand 02/15/2015   Elbow pain, chronic, right 01/03/2015   Cervical radiculopathy at C8 03/21/2014   Degenerative disc disease, cervical 08/18/2013   Degenerative arthritis of lumbar spine 08/18/2013   Achilles tendinitis 01/06/2012   HYPOTHYROIDISM 03/15/2009   Plantar fasciitis, right 03/15/2009   CAVUS DEFORMITY OF FOOT, ACQUIRED 03/15/2009      REFERRING PROVIDER: Malachy Mood, MD  REFERRING DIAG: LE weakness , s/p Right breast cancer  THERAPY DIAG:  Malignant neoplasm of upper-outer quadrant of right female breast, unspecified estrogen receptor status  Muscle weakness (generalized)  Stiffness of right shoulder, not elsewhere classified  ONSET DATE: 01/08/2022  Rationale for Evaluation and Treatment: Rehabilitation  SUBJECTIVE:  SUBJECTIVE STATEMENT: Rt elbow still tingles but that is something I've dealt with since before surgery. It had settled down before surgery but it's flared back up since then. The stretches she gave me last time are going well. My Rt breast is feeling better since finishing radiation 2 weeks ago. It doesn't feel as sunburnt.   PERTINENT HISTORY:   Pt had right lumpectomy with SLNB on 01/08/2022 and 0/2 LN's. Pathology showed invasive and in Situ Ductal Carcinoma and prognostics were Triple negative. A port was placed at time of surgery for chemotherapy with Breast TC q 21 D Her course was complicated by pneumothorax which has resolved. Her radiation ended on 07/09/2022. Her legs are achy and weak  PAIN:  Are you having pain? No   PRECAUTIONS: right UE lymphedema  risk  WEIGHT BEARING RESTRICTIONS: No  FALLS:  Has patient fallen in last 6 months? No  LIVING ENVIRONMENT: Lives with: lives with their spouse Lives in: House/apartment Stairs: No; External: 1 steps; none Has following equipment at home: Single point cane  OCCUPATION: not working, cares for 4 grandchildren 9 months to 75 years old  LEISURE: garden, read,   HAND DOMINANCE: right   PRIOR LEVEL OF FUNCTION: Independent  PATIENT GOALS: improve Right shoulder ROM, increase leg strength   OBJECTIVE:  COGNITION: Overall cognitive status: Within functional limits for tasks assessed   PALPATION: No palpable cording on right  OBSERVATIONS / OTHER ASSESSMENTS: No visible cording  SENSATION: Light touch: Deficits right posterior arm  POSTURE: forward head, rounded shoulders  UPPER EXTREMITY AROM/PROM:  A/PROM RIGHT   eval   Shoulder extension 53  Shoulder flexion 143  Shoulder abduction 145  Shoulder internal rotation 45  Shoulder external rotation 101    (Blank rows = not tested)  A/PROM LEFT   eval  Shoulder extension 50  Shoulder flexion 160  Shoulder abduction 154  Shoulder internal rotation 58  Shoulder external rotation 100    (Blank rows = not tested)  CERVICAL AROM: All within functional limits:      UPPER EXTREMITY STRENGTH: WFL   LOWER EXTREMITY AROM/PROM:  A/PROM Right eval  Hip flexion 4+  Hip extension Can bridge  Hip abduction 4+  Hip adduction 4+  Hip internal rotation 4  Hip external rotation 4  Knee flexion 4+  Knee extension 4+  Ankle dorsiflexion 5  Ankle plantarflexion   Ankle inversion   Ankle eversion    (Blank rows = not tested)  A/PROM LEFT eval  Hip flexion 4  Hip extension Can bridge  Hip abduction 4-  Hip adduction 3+  Hip internal rotation 4  Hip external rotation 3+  Knee flexion 4-  Knee extension 4-  Ankle dorsiflexion 4  Ankle plantarflexion   Ankle inversion   Ankle eversion    (Blank rows = not  tested)    LYMPHEDEMA ASSESSMENTS:   SURGERY TYPE/DATE: right lumpectomy 01/08/2022  NUMBER OF LYMPH NODES REMOVED: 0/2  CHEMOTHERAPY: yes, TC  RADIATION:Yes ended 07/09/2022  HORMONE TREATMENT: no  INFECTIONS: no  LYMPHEDEMA ASSESSMENTS:   LANDMARK RIGHT  eval  10 cm proximal to olecranon process 29.9  Olecranon process 26.0  10 cm proximal to ulnar styloid process 22.6  Just proximal to ulnar styloid process 14.5  Across hand at thumb web space 19.4  At base of 2nd digit 6.2  (Blank rows = not tested)  LANDMARK LEFT  eval  10 cm proximal to olecranon process 31.7  Olecranon process 25.5  10 cm  proximal to ulnar styloid process 22.1  Just proximal to ulnar styloid process 14.5  Across hand at thumb web space 18.45  At base of 2nd digit 5.85  (Blank rows = not tested)  FUNCTIONAL TESTS:  30 seconds chair stand test: 7 repetitions.  4 position balance test; able to maintain all x 10 seconds, left leg trembling slightly with SLS   L-DEX LYMPHEDEMA SCREENING:  The patient was assessed using the L-Dex machine today to produce a lymphedema index baseline score. The patient will be reassessed on a regular basis (typically every 3 months) to obtain new L-Dex scores. If the score is > 6.5 points away from his/her baseline score indicating onset of subclinical lymphedema, it will be recommended to wear a compression garment for 4 weeks, 12 hours per day and then be reassessed. If the score continues to be > 6.5 points from baseline at reassessment, we will initiate lymphedema treatment. Assessing in this manner has a 95% rate of preventing clinically significant lymphedema.    QUICK DASH SURVEY: 18%   TODAY'S TREATMENT:                                                                                                                                         DATE:  07/28/22: Therapeutic Exercises Pulleys into flexion and abd x 2 mins each returning therapist demo and VC's to  decrease Rt scapular compensation Roll yellow ball up wall into flexion x 10 Neuromuscular Re Ed In // bars: Heel- toe walking front x4, retro x2, grapevine 2x each side, toe walking x 2 but increased pain in Lt low back and inferior to glut, heel walking x 2; slow and controlled high knee marching x 2, then standing on blue oval for contralateral hip 3 way raises into flex, abd and ext returning therapist demo and VC's throughout for decrease compensations x 10 each LE; 6" front step ups x10 each leg with 3" SLS with each rep Manual Therapy Assessed pts Rt breast as she reports noticing dimples. She does have peau d'orange present which may be from her just having completed radiation 2 weeks ago. Also her breast tissue feels supple with no fibrosis palpated. During rest of manual therapy she was educated in signs of lymphedema to be aware of like worsening peau d'orange, especially if fibrosis palpated. Also pt reports having a compression bra but has not been wearing it so educated her on importance of wearing this 24/7 as much as able over next few months during recovery from radiation. Pt able to verbalize good understanding.  P/ROM to Rt shoulder into flexion, abd and D2 to pts tolerance STM to Rt lateral trunk and lateral border of scapula where pt palpably tight.   07/22/2022 Educated in 4 post op exercises to progress Left shoulder ROM and discussed POC  PATIENT EDUCATION:  Education details: 4 post op exs; 07/28/22: Balance and bil  LE strength Person educated: Patient Education method: Demonstration and Handouts, verbal and tactile cues Education comprehension: verbalized understanding and returned demonstration, will benefit from further review  HOME EXERCISE PROGRAM: 4 post op exs for shoulder ROM  ASSESSMENT:  CLINICAL IMPRESSION: Pt reports her breast seems to be healing well since completing radiation. Upon inspection redness is improving and no fibrosis palpable. Today worked on  bil LE strength and balance activities which she did well with. VC's required for correct technique and to limit compensations, but was able to do so after cuing. Added these to HEP. Then P/ROM to Rt shoulder with STM along Rt lateral trunk where tightness palpable. Pt reports feeling good after session.   OBJECTIVE IMPAIRMENTS: decreased knowledge of condition, decreased ROM, decreased strength, impaired UE functional use, postural dysfunction, and pain.  Educated in 4 post op exercises ACTIVITY LIMITATIONS: reach over head and rising from low chairs/floor  PARTICIPATION LIMITATIONS:  able to participate in most  PERSONAL FACTORS: Left breast cancer s/p radiation and chemotherapy are also affecting patient's functional outcome.   REHAB POTENTIAL: Good  CLINICAL DECISION MAKING: Stable/uncomplicated  EVALUATION COMPLEXITY: Low  GOALS: Goals reviewed with patient? Yes  SHORT TERM GOALS = LONG TERM GOALS: Target date: 09/02/2022  Independent with HEP for ROM and strengthening Baseline: Goal status: INITIAL  2.  Able to perform 14 sit to stands in 30 seconds for avg rating for age level to reduce risk of falls Baseline: 7 Goal status: INITIAL  3.  Left shoulder ROM WNL to resume all home activities without limiation Baseline:  Goal status: INITIAL  4.  Pt will be able to get up and down from the floor with improved ease Baseline:  Goal status: INITIAL  5. Pt will attend ABC class for education in lymphedema Baseline:  Goal status: INITIAL   PLAN:  PT FREQUENCY: 2x/week  PT DURATION: 6 weeks  PLANNED INTERVENTIONS: Therapeutic exercises, Therapeutic activity, Patient/Family education, Self Care, Joint mobilization, Orthotic/Fit training, Dry Needling, scar mobilization, Manual therapy, and Re-evaluation  PLAN FOR NEXT SESSION: set up for next ABC class, Does pt notice any breast swelling from radiation? Review and cont bil LE strength and balance, Progress to practice  getting up from floor, PROM prn for Right UE ROM   Hermenia Bers, PTA 07/28/2022, 3:25 PM

## 2022-07-28 NOTE — Patient Instructions (Signed)
Heel Raise: Bilateral (Standing)   Cancer Rehab [phone]    Stand near counter for fingertip support if needed. Rise on balls of feet. Repeat __10-20__ times per set. Do _1-2___ sets per session. Do __2__ sessions per day.  SINGLE LIMB STANCE    Stand at counter (corner if you have one) for minimal arm support. Raise leg. Hold _10-20__ seconds. Repeat with other leg. Once this becomes easier, for increased challenge, close eyes with fingertips on counter for safety. _3-5__ reps per set, _2-3__ sets per day.   Tandem Stance    Stand at counter (corner if you have one). Right foot in front of left, heel touching toe both feet "straight ahead". Stand on Foot Triangle of Support with both feet. Balance in this position _10-20__ seconds. Do with left foot in front of right. Once this becomes easier, for increased challenge, close eyes with fingertips on counter for safety. Can also walk front and backwards in hallway.  Step-Up: Forward    Move step-stool to counter or hold rail if at steps, but as little as able. Step up forward keeping opposite foot off step. Keep pelvis level and back straight. Hold the single leg stand for 3 seconds, then come back down slowly.  Do _10__ times, do _1-2_ sets, on each leg, _1-2__ times per day.          HIP: Flexion Standing    Holding onto counter lift leg straight in front keeping knee straight. Slow and controlled! _10__ reps per set, _2-3__ sets per day. Then repeat with other leg.   Hip Extension (Standing)    Stand with support at counter. Squeeze pelvic floor and hold so as not to twist hips and don't lean forward.  Move right leg backward with straight knee. Slow and controlled! Repeat _10__ times. Do _2-3__ times a day. Repeat with other leg.  Hip Abduction (Standing)    Stand with support. Squeeze pelvic floor and hold. Lift right leg out to side, keeping toe forward.  Repeat _10__ times. Do _2-3__ times a day. Repeat with  other leg.

## 2022-07-30 ENCOUNTER — Ambulatory Visit: Payer: 59

## 2022-07-30 DIAGNOSIS — G8929 Other chronic pain: Secondary | ICD-10-CM | POA: Diagnosis not present

## 2022-07-30 DIAGNOSIS — M6281 Muscle weakness (generalized): Secondary | ICD-10-CM | POA: Diagnosis not present

## 2022-07-30 DIAGNOSIS — M25611 Stiffness of right shoulder, not elsewhere classified: Secondary | ICD-10-CM

## 2022-07-30 DIAGNOSIS — C50411 Malignant neoplasm of upper-outer quadrant of right female breast: Secondary | ICD-10-CM | POA: Diagnosis not present

## 2022-07-30 DIAGNOSIS — Z171 Estrogen receptor negative status [ER-]: Secondary | ICD-10-CM | POA: Diagnosis not present

## 2022-07-30 DIAGNOSIS — M5442 Lumbago with sciatica, left side: Secondary | ICD-10-CM | POA: Diagnosis not present

## 2022-07-30 NOTE — Therapy (Addendum)
OUTPATIENT PHYSICAL THERAPY ONCOLOGY TREATMENT  Patient Name: Samantha [name] MRN: [id number] DOB:[birth year], 62 y.o., female Today's Date: 07/30/2022  END OF SESSION:  PT End of Session - 07/30/22 1206     Visit Number 3    Number of Visits 12    Date for PT Re-Evaluation 09/02/22    PT Start Time 1207    PT Stop Time 1250    PT Time Calculation (min) 43 min    Activity Tolerance Patient tolerated treatment well    Behavior During Therapy [facility] for tasks assessed/performed             Past Medical History:  Diagnosis Date   Allergy    Anxiety 2015   Result of culmination of super stressful caregiving and deaths of 2 immediate family members.  Overall do pretty well.   Cancer 2023   right breast   Depression    GERD (gastroesophageal reflux disease)    Hypothyroidism    Right carpal tunnel syndrome 09/19/2019   Thyroid disease    Past Surgical History:  Procedure Laterality Date   BREAST LUMPECTOMY WITH RADIOACTIVE SEED LOCALIZATION Right 01/08/2022   Procedure: RIGHT BREAST LUMPECTOMY WITH RADIOACTIVE SEED LOCALIZATION;  Surgeon: [name], MD;  Location: [facility];  Service: General;  Laterality: Right;   PORT-A-CATH REMOVAL Left 05/20/2022   Procedure: REMOVAL PORT-A-CATH;  Surgeon: [name], MD;  Location: [facility];  Service: General;  Laterality: Left;   PORTACATH PLACEMENT Left 01/28/2022   Procedure: INSERTION PORT-A-CATH;  Surgeon: [name], MD;  Location: [facility];  Service: General;  Laterality: Left;   SENTINEL NODE BIOPSY Right 01/28/2022   Procedure: RIGHT SENTINEL LYMPH NODE BIOPSY;  Surgeon: [name], MD;  Location: [facility];  Service: General;  Laterality: Right;   TONSILLECTOMY     Patient Active Problem List   Diagnosis Date Noted   Port-A-Cath in place 02/27/2022   Malignant neoplasm of upper-outer quadrant of right breast in female, estrogen receptor negative 12/23/2021   Radiculopathy  of lumbar region 09/26/2020   Strain of right tibialis anterior muscle 09/26/2020   Carpal tunnel syndrome 09/19/2019   Thoracic disc disorder 02/04/2018   Inflammatory heel pain 02/22/2016   Osteoarthritis, hand 02/15/2015   Elbow pain, chronic, right 01/03/2015   Cervical radiculopathy at C8 03/21/2014   Degenerative disc disease, cervical 08/18/2013   Degenerative arthritis of lumbar spine 08/18/2013   Achilles tendinitis 01/06/2012   HYPOTHYROIDISM 03/15/2009   Plantar fasciitis, right 03/15/2009   CAVUS DEFORMITY OF FOOT, ACQUIRED 03/15/2009      REFERRING PROVIDER: Malachy Mood, MD  REFERRING DIAG: LE weakness , s/p Right breast cancer  THERAPY DIAG:  Malignant neoplasm of upper-outer quadrant of right female breast, unspecified estrogen receptor status  Muscle weakness (generalized)  Stiffness of right shoulder, not elsewhere classified  ONSET DATE: 01/08/2022  Rationale for Evaluation and Treatment: Rehabilitation  SUBJECTIVE:  SUBJECTIVE STATEMENT:  I haven't gotten to try the exercises yet that she gave me. I wore the compression bra yesterday, Its sort of hot.  PERTINENT HISTORY:   Pt had right lumpectomy with SLNB on 01/08/2022 and 0/2 LN's. Pathology showed invasive and in Situ Ductal Carcinoma and prognostics were Triple negative. A port was placed at time of surgery for chemotherapy with Breast TC q 21 D Her course was complicated by pneumothorax which has resolved. Her radiation ended on 07/09/2022. Her legs are achy and weak  PAIN:  Are you having pain? No   PRECAUTIONS: right UE lymphedema risk  WEIGHT BEARING RESTRICTIONS: No  FALLS:  Has patient fallen in last 6 months? No  LIVING ENVIRONMENT: Lives with: lives with their spouse Lives in: House/apartment Stairs: No;  External: 1 steps; none Has following equipment at home: Single point cane  OCCUPATION: not working, cares for 4 grandchildren 9 months to 29 years old  LEISURE: garden, read,   HAND DOMINANCE: right   PRIOR LEVEL OF FUNCTION: Independent  PATIENT GOALS: improve Right shoulder ROM, increase leg strength   OBJECTIVE:  COGNITION: Overall cognitive status: Within functional limits for tasks assessed   PALPATION: No palpable cording on right  OBSERVATIONS / OTHER ASSESSMENTS: No visible cording  SENSATION: Light touch: Deficits right posterior arm  POSTURE: forward head, rounded shoulders  UPPER EXTREMITY AROM/PROM:  A/PROM RIGHT   eval   Shoulder extension 53  Shoulder flexion 143  Shoulder abduction 145  Shoulder internal rotation 45  Shoulder external rotation 101    (Blank rows = not tested)  A/PROM LEFT   eval  Shoulder extension 50  Shoulder flexion 160  Shoulder abduction 154  Shoulder internal rotation 58  Shoulder external rotation 100    (Blank rows = not tested)  CERVICAL AROM: All within functional limits:      UPPER EXTREMITY STRENGTH: WFL   LOWER EXTREMITY AROM/PROM:  A/PROM Right eval  Hip flexion 4+  Hip extension Can bridge  Hip abduction 4+  Hip adduction 4+  Hip internal rotation 4  Hip external rotation 4  Knee flexion 4+  Knee extension 4+  Ankle dorsiflexion 5  Ankle plantarflexion   Ankle inversion   Ankle eversion    (Blank rows = not tested)  A/PROM LEFT eval  Hip flexion 4  Hip extension Can bridge  Hip abduction 4-  Hip adduction 3+  Hip internal rotation 4  Hip external rotation 3+  Knee flexion 4-  Knee extension 4-  Ankle dorsiflexion 4  Ankle plantarflexion   Ankle inversion   Ankle eversion    (Blank rows = not tested)    LYMPHEDEMA ASSESSMENTS:   SURGERY TYPE/DATE: right lumpectomy 01/08/2022  NUMBER OF LYMPH NODES REMOVED: 0/2  CHEMOTHERAPY: yes, TC  RADIATION:Yes ended  07/09/2022  HORMONE TREATMENT: no  INFECTIONS: no  LYMPHEDEMA ASSESSMENTS:   LANDMARK RIGHT  eval  10 cm proximal to olecranon process 29.9  Olecranon process 26.0  10 cm proximal to ulnar styloid process 22.6  Just proximal to ulnar styloid process 14.5  Across hand at thumb web space 19.4  At base of 2nd digit 6.2  (Blank rows = not tested)  LANDMARK LEFT  eval  10 cm proximal to olecranon process 31.7  Olecranon process 25.5  10 cm proximal to ulnar styloid process 22.1  Just proximal to ulnar styloid process 14.5  Across hand at thumb web space 18.45  At base of 2nd digit 5.85  (Blank rows =  not tested)  FUNCTIONAL TESTS:  30 seconds chair stand test: 7 repetitions.  4 position balance test; able to maintain all x 10 seconds, left leg trembling slightly with SLS   L-DEX LYMPHEDEMA SCREENING:  The patient was assessed using the L-Dex machine today to produce a lymphedema index baseline score. The patient will be reassessed on a regular basis (typically every 3 months) to obtain new L-Dex scores. If the score is > 6.5 points away from his/her baseline score indicating onset of subclinical lymphedema, it will be recommended to wear a compression garment for 4 weeks, 12 hours per day and then be reassessed. If the score continues to be > 6.5 points from baseline at reassessment, we will initiate lymphedema treatment. Assessing in this manner has a 95% rate of preventing clinically significant lymphedema.    QUICK DASH SURVEY: 18%   TODAY'S TREATMENT:                                                                                                                                         DATE:  Pt permission and consent throughout each step of examination and treatment with modification and draping if requested when working on sensitive areas  07/30/2022 Soft tissue mobilization bilateral UT, Right pectorals and lateral trunk PROM Right shoulder flex, scaption, IR and  ER AROM bilateral shoulder flexion, scaption, horizontal abd in supine x5 Sit to stand from chair with pillow in in x 10 with arms crossed and slowly lowering. Assessed right breast;Nipple is swollen and dark from mag trace, mildly enlarged pores; foam pad in TG soft made to cover pores and nipple and spaghetti foam also cut to make less noticeable in the day time Answered pt questions about chemo and lymphedema and advised that finger swelling on left , ankle swelling not from lymphedema.   07/28/22: Therapeutic Exercises Pulleys into flexion and abd x 2 mins each returning therapist demo and VC's to decrease Rt scapular compensation Roll yellow ball up wall into flexion x 10 Neuromuscular Re Ed In // bars: Heel- toe walking front x4, retro x2, grapevine 2x each side, toe walking x 2 but increased pain in Lt low back and inferior to glut, heel walking x 2; slow and controlled high knee marching x 2, then standing on blue oval for contralateral hip 3 way raises into flex, abd and ext returning therapist demo and VC's throughout for decrease compensations x 10 each LE; 6" front step ups x10 each leg with 3" SLS with each rep Manual Therapy Assessed pts Rt breast as she reports noticing dimples. She does have peau d'orange present which may be from her just having completed radiation 2 weeks ago. Also her breast tissue feels supple with no fibrosis palpated. During rest of manual therapy she was educated in signs of lymphedema to be aware of like worsening peau d'orange, especially if fibrosis palpated. Also pt reports  having a compression bra but has not been wearing it so educated her on importance of wearing this 24/7 as much as able over next few months during recovery from radiation. Pt able to verbalize good understanding.  P/ROM to Rt shoulder into flexion, abd and D2 to pts tolerance STM to Rt lateral trunk and lateral border of scapula where pt palpably tight.   07/22/2022 Educated in 4 post  op exercises to progress Right shoulder ROM and discussed POC  PATIENT EDUCATION:  Education details: 4 post op exs; 07/28/22: Balance and bil LE strength Person educated: Patient Education method: Demonstration and Handouts, verbal and tactile cues Education comprehension: verbalized understanding and returned demonstration, will benefit from further review  HOME EXERCISE PROGRAM: 4 post op exs for shoulder ROM  ASSESSMENT:  CLINICAL IMPRESSION:  . Pt tight in UT and right pectorals with minor soreness and reported feeling better after treatment. PROM and AROM progressing nicely. Legs slightly tembly with sit to stand. Breast with mild swelling without fibrosis. Pads made for bra  OBJECTIVE IMPAIRMENTS: decreased knowledge of condition, decreased ROM, decreased strength, impaired UE functional use, postural dysfunction, and pain.  Educated in 4 post op exercises ACTIVITY LIMITATIONS: reach over head and rising from low chairs/floor  PARTICIPATION LIMITATIONS:  able to participate in most  PERSONAL FACTORS: Left breast cancer s/p radiation and chemotherapy are also affecting patient's functional outcome.   REHAB POTENTIAL: Good  CLINICAL DECISION MAKING: Stable/uncomplicated  EVALUATION COMPLEXITY: Low  GOALS: Goals reviewed with patient? Yes  SHORT TERM GOALS = LONG TERM GOALS: Target date: 09/02/2022  Independent with HEP for ROM and strengthening Baseline: Goal status: INITIAL  2.  Able to perform 14 sit to stands in 30 seconds for avg rating for age level to reduce risk of falls Baseline: 7 Goal status: INITIAL  3.  Right shoulder ROM WNL to resume all home activities without limiation Baseline:  Goal status: INITIAL  4.  Pt will be able to get up and down from the floor with improved ease Baseline:  Goal status: INITIAL  5. Pt will attend ABC class for education in lymphedema Baseline:  Goal status: INITIAL   PLAN:  PT FREQUENCY: 2x/week  PT DURATION:  6 weeks  PLANNED INTERVENTIONS: Therapeutic exercises, Therapeutic activity, Patient/Family education, Self Care, Joint mobilization, Orthotic/Fit training, Dry Needling, scar mobilization, Manual therapy, and Re-evaluation  PLAN FOR NEXT SESSION: check AROM, Did foam in bra help, set up for next ABC class, Does pt notice any breast swelling from radiation? Review and cont bil LE strength and balance, Progress to practice getting up from floor, PROM prn for Right UE ROM   Waynette Buttery, PT 07/30/2022, 12:59 PM

## 2022-08-04 ENCOUNTER — Ambulatory Visit: Payer: 59

## 2022-08-06 ENCOUNTER — Ambulatory Visit: Payer: 59

## 2022-08-06 DIAGNOSIS — C50411 Malignant neoplasm of upper-outer quadrant of right female breast: Secondary | ICD-10-CM

## 2022-08-06 DIAGNOSIS — M5442 Lumbago with sciatica, left side: Secondary | ICD-10-CM | POA: Diagnosis not present

## 2022-08-06 DIAGNOSIS — M6281 Muscle weakness (generalized): Secondary | ICD-10-CM

## 2022-08-06 DIAGNOSIS — Z171 Estrogen receptor negative status [ER-]: Secondary | ICD-10-CM | POA: Diagnosis not present

## 2022-08-06 DIAGNOSIS — M25611 Stiffness of right shoulder, not elsewhere classified: Secondary | ICD-10-CM | POA: Diagnosis not present

## 2022-08-06 DIAGNOSIS — G8929 Other chronic pain: Secondary | ICD-10-CM | POA: Diagnosis not present

## 2022-08-06 NOTE — Therapy (Signed)
OUTPATIENT PHYSICAL THERAPY ONCOLOGY TREATMENT  Patient Name: Samantha [name] MRN: [id number] DOB:21-May-1960, 62 y.o., female Today's Date: 08/06/2022  END OF SESSION:  PT End of Session - 08/06/22 1601     Visit Number 4    Number of Visits 12    Date for PT Re-Evaluation 09/02/22    PT Start Time 1602    Activity Tolerance Patient tolerated treatment well    Behavior During Therapy [facility] for tasks assessed/performed             Past Medical History:  Diagnosis Date   Allergy    Anxiety 2015   Result of culmination of super stressful caregiving and deaths of 2 immediate family members.  Overall do pretty well.   Cancer 2023   right breast   Depression    GERD (gastroesophageal reflux disease)    Hypothyroidism    Right carpal tunnel syndrome 09/19/2019   Thyroid disease    Past Surgical History:  Procedure Laterality Date   BREAST LUMPECTOMY WITH RADIOACTIVE SEED LOCALIZATION Right 01/08/2022   Procedure: RIGHT BREAST LUMPECTOMY WITH RADIOACTIVE SEED LOCALIZATION;  Surgeon: [name], MD;  Location: [facility];  Service: General;  Laterality: Right;   PORT-A-CATH REMOVAL Left 05/20/2022   Procedure: REMOVAL PORT-A-CATH;  Surgeon: [name], MD;  Location: [facility];  Service: General;  Laterality: Left;   PORTACATH PLACEMENT Left 01/28/2022   Procedure: INSERTION PORT-A-CATH;  Surgeon: [name], MD;  Location: [facility];  Service: General;  Laterality: Left;   SENTINEL NODE BIOPSY Right 01/28/2022   Procedure: RIGHT SENTINEL LYMPH NODE BIOPSY;  Surgeon: [name], MD;  Location: [facility];  Service: General;  Laterality: Right;   TONSILLECTOMY     Patient Active Problem List   Diagnosis Date Noted   Port-A-Cath in place 02/27/2022   Malignant neoplasm of upper-outer quadrant of right breast in female, estrogen receptor negative 12/23/2021   Radiculopathy of lumbar region 09/26/2020   Strain of right tibialis  anterior muscle 09/26/2020   Carpal tunnel syndrome 09/19/2019   Thoracic disc disorder 02/04/2018   Inflammatory heel pain 02/22/2016   Osteoarthritis, hand 02/15/2015   Elbow pain, chronic, right 01/03/2015   Cervical radiculopathy at C8 03/21/2014   Degenerative disc disease, cervical 08/18/2013   Degenerative arthritis of lumbar spine 08/18/2013   Achilles tendinitis 01/06/2012   HYPOTHYROIDISM 03/15/2009   Plantar fasciitis, right 03/15/2009   CAVUS DEFORMITY OF FOOT, ACQUIRED 03/15/2009      REFERRING PROVIDER: Malachy Mood, MD  REFERRING DIAG: LE weakness , s/p Right breast cancer  THERAPY DIAG:  Malignant neoplasm of upper-outer quadrant of right female breast, unspecified estrogen receptor status  Muscle weakness (generalized)  Stiffness of right shoulder, not elsewhere classified  ONSET DATE: 01/08/2022  Rationale for Evaluation and Treatment: Rehabilitation  SUBJECTIVE:  SUBJECTIVE STATEMENT:  I have been doing the exercises. When I stand on the left or I stand on the right foot and move the left it causes pain to run down my leg.  I can get up better from chairs now and the lowering is easier but still I can plop. I still feel the pull when I reach.  I tried the spaghetti foam in my compression bra and it seems to help  PERTINENT HISTORY:   Pt had right lumpectomy with SLNB on 01/08/2022 and 0/2 LN's. Pathology showed invasive and in Situ Ductal Carcinoma and prognostics were Triple negative. A port was placed at time of surgery for chemotherapy with Breast TC q 21 D Her course was complicated by pneumothorax which has resolved. Her radiation ended on 07/09/2022. Her legs are achy and weak  PAIN:  Are you having pain? No pain, tingle in back and left leg. No pain in breast  area.  PRECAUTIONS: right UE lymphedema risk  WEIGHT BEARING RESTRICTIONS: No  FALLS:  Has patient fallen in last 6 months? No  LIVING ENVIRONMENT: Lives with: lives with their spouse Lives in: House/apartment Stairs: No; External: 1 steps; none Has following equipment at home: Single point cane  OCCUPATION: not working, cares for 4 grandchildren 9 months to 83 years old  LEISURE: garden, read,   HAND DOMINANCE: right   PRIOR LEVEL OF FUNCTION: Independent  PATIENT GOALS: improve Right shoulder ROM, increase leg strength   OBJECTIVE:  COGNITION: Overall cognitive status: Within functional limits for tasks assessed   PALPATION: No palpable cording on right  OBSERVATIONS / OTHER ASSESSMENTS: No visible cording  SENSATION: Light touch: Deficits right posterior arm  POSTURE: forward head, rounded shoulders  UPPER EXTREMITY AROM/PROM:  A/PROM RIGHT   eval  RIGHT 08/06/2022  Shoulder extension 53   Shoulder flexion 143 159  Shoulder abduction 145 162  Shoulder internal rotation 45   Shoulder external rotation 101     (Blank rows = not tested)  A/PROM LEFT   eval  Shoulder extension 50  Shoulder flexion 160  Shoulder abduction 154  Shoulder internal rotation 58  Shoulder external rotation 100    (Blank rows = not tested)  CERVICAL AROM: All within functional limits:      UPPER EXTREMITY STRENGTH: WFL   LOWER EXTREMITY AROM/PROM:  A/PROM Right eval  Hip flexion 4+  Hip extension Can bridge  Hip abduction 4+  Hip adduction 4+  Hip internal rotation 4  Hip external rotation 4  Knee flexion 4+  Knee extension 4+  Ankle dorsiflexion 5  Ankle plantarflexion   Ankle inversion   Ankle eversion    (Blank rows = not tested)  A/PROM LEFT eval  Hip flexion 4  Hip extension Can bridge  Hip abduction 4-  Hip adduction 3+  Hip internal rotation 4  Hip external rotation 3+  Knee flexion 4-  Knee extension 4-  Ankle dorsiflexion 4  Ankle  plantarflexion   Ankle inversion   Ankle eversion    (Blank rows = not tested)    LYMPHEDEMA ASSESSMENTS:   SURGERY TYPE/DATE: right lumpectomy 01/08/2022  NUMBER OF LYMPH NODES REMOVED: 0/2  CHEMOTHERAPY: yes, TC  RADIATION:Yes ended 07/09/2022  HORMONE TREATMENT: no  INFECTIONS: no  LYMPHEDEMA ASSESSMENTS:   LANDMARK RIGHT  eval  10 cm proximal to olecranon process 29.9  Olecranon process 26.0  10 cm proximal to ulnar styloid process 22.6  Just proximal to ulnar styloid process 14.5  Across hand at  thumb web space 19.4  At base of 2nd digit 6.2  (Blank rows = not tested)  LANDMARK LEFT  eval  10 cm proximal to olecranon process 31.7  Olecranon process 25.5  10 cm proximal to ulnar styloid process 22.1  Just proximal to ulnar styloid process 14.5  Across hand at thumb web space 18.45  At base of 2nd digit 5.85  (Blank rows = not tested)  FUNCTIONAL TESTS:  30 seconds chair stand test: 7 repetitions.  4 position balance test; able to maintain all x 10 seconds, left leg trembling slightly with SLS   L-DEX LYMPHEDEMA SCREENING:  The patient was assessed using the L-Dex machine today to produce a lymphedema index baseline score. The patient will be reassessed on a regular basis (typically every 3 months) to obtain new L-Dex scores. If the score is > 6.5 points away from his/her baseline score indicating onset of subclinical lymphedema, it will be recommended to wear a compression garment for 4 weeks, 12 hours per day and then be reassessed. If the score continues to be > 6.5 points from baseline at reassessment, we will initiate lymphedema treatment. Assessing in this manner has a 95% rate of preventing clinically significant lymphedema.    QUICK DASH SURVEY: 18%   TODAY'S TREATMENT:                                                                                                                                         DATE:  Pt permission and consent throughout  each step of examination and treatment with modification and draping if requested when working on sensitive areas 08/06/2022 Soft tissue mobilization bilateral UT, Right pectorals and lateral trunk with cocoa butter Supine wand flex and scaption x4-5 reps PROM Right shoulder flex, scaption, IR and ER Swelling continues at right breast; initiated right breast MLD with verbalization and instruction to pt. In supine: Short neck, 5 diaphragmatic breaths, L axillary nodes and establishment of interaxillary pathway, R inguinal nodes and establishment of axilloinguinal pathway, then R breast moving fluid towards pathways spending extra time in any areas of fibrosis then retracing all steps. Pt practiced LN activation, pathways and briefly at right breast Pt scheduled for ABC class on May 20 Measured left shoulder ROM Gave pt MLD handout for breast   07/30/2022 Soft tissue mobilization bilateral UT, Right pectorals and lateral trunk PROM Right shoulder flex, scaption, IR and ER AROM bilateral shoulder flexion, scaption, horizontal abd in supine x5 Sit to stand from chair with pillow in in x 10 with arms crossed and slowly lowering. Assessed right breast;Nipple is swollen and dark from mag trace, mildly enlarged pores; foam pad in TG soft made to cover pores and nipple and spaghetti foam also cut to make less noticeable in the day time Answered pt questions about chemo and lymphedema and advised that finger swelling on left , ankle swelling not from lymphedema.  07/28/22: Therapeutic Exercises Pulleys into flexion and abd x 2 mins each returning therapist demo and VC's to decrease Rt scapular compensation Roll yellow ball up wall into flexion x 10 Neuromuscular Re Ed In // bars: Heel- toe walking front x4, retro x2, [place] 2x each side, toe walking x 2 but increased pain in Lt low back and inferior to glut, heel walking x 2; slow and controlled high knee marching x 2, then standing on blue oval for  contralateral hip 3 way raises into flex, abd and ext returning therapist demo and VC's throughout for decrease compensations x 10 each LE; 6" front step ups x10 each leg with 3" SLS with each rep Manual Therapy Assessed pts Rt breast as she reports noticing dimples. She does have peau d'orange present which may be from her just having completed radiation 2 weeks ago. Also her breast tissue feels supple with no fibrosis palpated. During rest of manual therapy she was educated in signs of lymphedema to be aware of like worsening peau d'orange, especially if fibrosis palpated. Also pt reports having a compression bra but has not been wearing it so educated her on importance of wearing this 24/7 as much as able over next few months during recovery from radiation. Pt able to verbalize good understanding.  P/ROM to Rt shoulder into flexion, abd and D2 to pts tolerance STM to Rt lateral trunk and lateral border of scapula where pt palpably tight.   07/22/2022 Educated in 4 post op exercises to progress Right shoulder ROM and discussed POC  PATIENT EDUCATION:  Education details: 4 post op exs; 07/28/22: Balance and bil LE strength Person educated: Patient Education method: Demonstration and Handouts, verbal and tactile cues Education comprehension: verbalized understanding and returned demonstration, will benefit from further review  HOME EXERCISE PROGRAM: 4 post op exs for shoulder ROM  ASSESSMENT:  CLINICAL IMPRESSION: Held LE exercises secondary to some increased back and leg pain especially with SLS activities.Pt with excellent improvement in Right shoulder AROM. Initiated right breast MLD and pt did very well. Pt to try over the weekend.   OBJECTIVE IMPAIRMENTS: decreased knowledge of condition, decreased ROM, decreased strength, impaired UE functional use, postural dysfunction, and pain.  Educated in 4 post op exercises ACTIVITY LIMITATIONS: reach over head and rising from low  chairs/floor  PARTICIPATION LIMITATIONS:  able to participate in most  PERSONAL FACTORS: Left breast cancer s/p radiation and chemotherapy are also affecting patient's functional outcome.   REHAB POTENTIAL: Good  CLINICAL DECISION MAKING: Stable/uncomplicated  EVALUATION COMPLEXITY: Low  GOALS: Goals reviewed with patient? Yes  SHORT TERM GOALS = LONG TERM GOALS: Target date: 09/02/2022  Independent with HEP for ROM and strengthening Baseline: Goal status: INITIAL  2.  Able to perform 14 sit to stands in 30 seconds for avg rating for age level to reduce risk of falls Baseline: 7 Goal status: INITIAL  3.  Right shoulder ROM WNL to resume all home activities without limiation Baseline:  Goal status: INITIAL  4.  Pt will be able to get up and down from the floor with improved ease Baseline:  Goal status: INITIAL  5. Pt will attend ABC class for education in lymphedema Baseline:  Goal status: INITIAL   PLAN:  PT FREQUENCY: 2x/week  PT DURATION: 6 weeks  PLANNED INTERVENTIONS: Therapeutic exercises, Therapeutic activity, Patient/Family education, Self Care, Joint mobilization, Orthotic/Fit training, Dry Needling, scar mobilization, Manual therapy, and Re-evaluation  PLAN FOR NEXT SESSION: check AROM, Did foam in bra help, set  up for next ABC class(done May 20)  , Does pt notice any breast swelling from radiation? Review and cont bil LE strength and balance, Progress to practice getting up from floor, PROM prn for Right UE ROM   Waynette Buttery, PT 08/06/2022, 4:01 PM

## 2022-08-11 ENCOUNTER — Ambulatory Visit: Payer: 59

## 2022-08-11 DIAGNOSIS — M5442 Lumbago with sciatica, left side: Secondary | ICD-10-CM | POA: Diagnosis not present

## 2022-08-11 DIAGNOSIS — C50411 Malignant neoplasm of upper-outer quadrant of right female breast: Secondary | ICD-10-CM | POA: Diagnosis not present

## 2022-08-11 DIAGNOSIS — M25611 Stiffness of right shoulder, not elsewhere classified: Secondary | ICD-10-CM | POA: Diagnosis not present

## 2022-08-11 DIAGNOSIS — Z171 Estrogen receptor negative status [ER-]: Secondary | ICD-10-CM | POA: Diagnosis not present

## 2022-08-11 DIAGNOSIS — M6281 Muscle weakness (generalized): Secondary | ICD-10-CM

## 2022-08-11 DIAGNOSIS — G8929 Other chronic pain: Secondary | ICD-10-CM

## 2022-08-11 NOTE — Therapy (Signed)
OUTPATIENT PHYSICAL THERAPY ONCOLOGY TREATMENT  Patient Name: Samantha [name] MRN: [id number] DOB:January 13, 1961, 62 y.o., female Today's Date: 08/11/2022  END OF SESSION:  PT End of Session - 08/11/22 1503     Visit Number 5    Number of Visits 12    Date for PT Re-Evaluation 09/02/22    PT Start Time 1504    PT Stop Time 1551    PT Time Calculation (min) 47 min    Activity Tolerance Patient tolerated treatment well    Behavior During Therapy [facility] for tasks assessed/performed             Past Medical History:  Diagnosis Date   Allergy    Anxiety 2015   Result of culmination of super stressful caregiving and deaths of 2 immediate family members.  Overall do pretty well.   Cancer (HCC) 2023   right breast   Depression    GERD (gastroesophageal reflux disease)    Hypothyroidism    Right carpal tunnel syndrome 09/19/2019   Thyroid disease    Past Surgical History:  Procedure Laterality Date   BREAST LUMPECTOMY WITH RADIOACTIVE SEED LOCALIZATION Right 01/08/2022   Procedure: RIGHT BREAST LUMPECTOMY WITH RADIOACTIVE SEED LOCALIZATION;  Surgeon: [name], MD;  Location: [facility];  Service: General;  Laterality: Right;   PORT-A-CATH REMOVAL Left 05/20/2022   Procedure: REMOVAL PORT-A-CATH;  Surgeon: [name], MD;  Location: [facility];  Service: General;  Laterality: Left;   PORTACATH PLACEMENT Left 01/28/2022   Procedure: INSERTION PORT-A-CATH;  Surgeon: [name], MD;  Location: [facility];  Service: General;  Laterality: Left;   SENTINEL NODE BIOPSY Right 01/28/2022   Procedure: RIGHT SENTINEL LYMPH NODE BIOPSY;  Surgeon: [name], MD;  Location: [facility];  Service: General;  Laterality: Right;   TONSILLECTOMY     Patient Active Problem List   Diagnosis Date Noted   Port-A-Cath in place 02/27/2022   Malignant neoplasm of upper-outer quadrant of right breast in female, estrogen receptor negative (HCC) 12/23/2021    Radiculopathy of lumbar region 09/26/2020   Strain of right tibialis anterior muscle 09/26/2020   Carpal tunnel syndrome 09/19/2019   Thoracic disc disorder 02/04/2018   Inflammatory heel pain 02/22/2016   Osteoarthritis, hand 02/15/2015   Elbow pain, chronic, right 01/03/2015   Cervical radiculopathy at C8 03/21/2014   Degenerative disc disease, cervical 08/18/2013   Degenerative arthritis of lumbar spine 08/18/2013   Achilles tendinitis 01/06/2012   HYPOTHYROIDISM 03/15/2009   Plantar fasciitis, right 03/15/2009   CAVUS DEFORMITY OF FOOT, ACQUIRED 03/15/2009      REFERRING PROVIDER: Malachy Mood, MD  REFERRING DIAG: LE weakness , s/p Right breast cancer  THERAPY DIAG:  Malignant neoplasm of upper-outer quadrant of right female breast, unspecified estrogen receptor status (HCC)  Muscle weakness (generalized)  Stiffness of right shoulder, not elsewhere classified  Chronic left-sided low back pain with left-sided sciatica  ONSET DATE: 01/08/2022  Rationale for Evaluation and Treatment: Rehabilitation  SUBJECTIVE:  SUBJECTIVE STATEMENT:  I did the MLD every day and occasionally every other day. ROM is doing well. Back pain is still bothering me.  PERTINENT HISTORY:   Pt had right lumpectomy with SLNB on 01/08/2022 and 0/2 LN's. Pathology showed invasive and in Situ Ductal Carcinoma and prognostics were Triple negative. A port was placed at time of surgery for chemotherapy with Breast TC q 21 D Her course was complicated by pneumothorax which has resolved. Her radiation ended on 07/09/2022. Her legs are achy and weak  PAIN:  Are you having pain? 1-2/10 pain, tingle in back and left leg. No pain in breast area.  PRECAUTIONS: right UE lymphedema risk  WEIGHT BEARING RESTRICTIONS: No  FALLS:   Has patient fallen in last 6 months? No  LIVING ENVIRONMENT: Lives with: lives with their spouse Lives in: House/apartment Stairs: No; External: 1 steps; none Has following equipment at home: Single point cane  OCCUPATION: not working, cares for 4 grandchildren 9 months to 73 years old  LEISURE: garden, read,   HAND DOMINANCE: right   PRIOR LEVEL OF FUNCTION: Independent  PATIENT GOALS: improve Right shoulder ROM, increase leg strength   OBJECTIVE:  COGNITION: Overall cognitive status: Within functional limits for tasks assessed   PALPATION: No palpable cording on right  OBSERVATIONS / OTHER ASSESSMENTS: No visible cording  SENSATION: Light touch: Deficits right posterior arm  POSTURE: forward head, rounded shoulders  UPPER EXTREMITY AROM/PROM:  A/PROM RIGHT   eval  RIGHT 08/06/2022  Shoulder extension 53   Shoulder flexion 143 159  Shoulder abduction 145 162  Shoulder internal rotation 45   Shoulder external rotation 101     (Blank rows = not tested)  A/PROM LEFT   eval  Shoulder extension 50  Shoulder flexion 160  Shoulder abduction 154  Shoulder internal rotation 58  Shoulder external rotation 100    (Blank rows = not tested)  CERVICAL AROM: All within functional limits:      UPPER EXTREMITY STRENGTH: WFL   LOWER EXTREMITY AROM/PROM:  A/PROM Right eval  Hip flexion 4+  Hip extension Can bridge  Hip abduction 4+  Hip adduction 4+  Hip internal rotation 4  Hip external rotation 4  Knee flexion 4+  Knee extension 4+  Ankle dorsiflexion 5  Ankle plantarflexion   Ankle inversion   Ankle eversion    (Blank rows = not tested)  A/PROM LEFT eval  Hip flexion 4  Hip extension Can bridge  Hip abduction 4-  Hip adduction 3+  Hip internal rotation 4  Hip external rotation 3+  Knee flexion 4-  Knee extension 4-  Ankle dorsiflexion 4  Ankle plantarflexion   Ankle inversion   Ankle eversion    (Blank rows = not  tested)    LYMPHEDEMA ASSESSMENTS:   SURGERY TYPE/DATE: right lumpectomy 01/08/2022  NUMBER OF LYMPH NODES REMOVED: 0/2  CHEMOTHERAPY: yes, TC  RADIATION:Yes ended 07/09/2022  HORMONE TREATMENT: no  INFECTIONS: no  LYMPHEDEMA ASSESSMENTS:   LANDMARK RIGHT  eval  10 cm proximal to olecranon process 29.9  Olecranon process 26.0  10 cm proximal to ulnar styloid process 22.6  Just proximal to ulnar styloid process 14.5  Across hand at thumb web space 19.4  At base of 2nd digit 6.2  (Blank rows = not tested)  LANDMARK LEFT  eval  10 cm proximal to olecranon process 31.7  Olecranon process 25.5  10 cm proximal to ulnar styloid process 22.1  Just proximal to ulnar styloid process 14.5  Across hand at thumb web space 18.45  At base of 2nd digit 5.85  (Blank rows = not tested)  FUNCTIONAL TESTS:  30 seconds chair stand test: 7 repetitions.  4 position balance test; able to maintain all x 10 seconds, left leg trembling slightly with SLS   L-DEX LYMPHEDEMA SCREENING:  The patient was assessed using the L-Dex machine today to produce a lymphedema index baseline score. The patient will be reassessed on a regular basis (typically every 3 months) to obtain new L-Dex scores. If the score is > 6.5 points away from his/her baseline score indicating onset of subclinical lymphedema, it will be recommended to wear a compression garment for 4 weeks, 12 hours per day and then be reassessed. If the score continues to be > 6.5 points from baseline at reassessment, we will initiate lymphedema treatment. Assessing in this manner has a 95% rate of preventing clinically significant lymphedema.    QUICK DASH SURVEY: 18%   TODAY'S TREATMENT:                                                                                                                                         DATE:    Pt permission and consent throughout each step of examination and treatment with modification and draping if  requested when working on sensitive areas  08/11/2022 Soft tissue mobilization bilateral UT, Right pectorals and lateral trunk with cocoa butter PROM Right shoulder flex, scaption, abduction,IR and ER  Supine scapular series with yellow band x 10 ea except sword x 5 Swelling continues at right breast; reviewed right breast MLD with verbalization  to pt. In supine: Short neck, 5 diaphragmatic breaths, L axillary nodes and establishment of interaxillary pathway, R inguinal nodes and establishment of axilloinguinal pathway, then R breast moving fluid towards pathways spending extra time in any areas of fibrosis then retracing all steps.  Updated HEP and gave yellow band  08/06/2022 Soft tissue mobilization bilateral UT, Right pectorals and lateral trunk with cocoa butter Supine wand flex and scaption x4-5 reps PROM Right shoulder flex, scaption, IR and ER Swelling continues at right breast; initiated right breast MLD with verbalization and instruction to pt. In supine: Short neck, 5 diaphragmatic breaths, L axillary nodes and establishment of interaxillary pathway, R inguinal nodes and establishment of axilloinguinal pathway, then R breast moving fluid towards pathways spending extra time in any areas of fibrosis then retracing all steps. Pt practiced LN activation, pathways and briefly at right breast Pt scheduled for ABC class on May 20 Measured left shoulder ROM Gave pt MLD handout for breast   07/30/2022 Soft tissue mobilization bilateral UT, Right pectorals and lateral trunk PROM Right shoulder flex, scaption, IR and ER AROM bilateral shoulder flexion, scaption, horizontal abd in supine x5 Sit to stand from chair with pillow in in x 10 with arms crossed and slowly lowering. Assessed right breast;Nipple is swollen and  dark from mag trace, mildly enlarged pores; foam pad in TG soft made to cover pores and nipple and spaghetti foam also cut to make less noticeable in the day time Answered pt  questions about chemo and lymphedema and advised that finger swelling on left , ankle swelling not from lymphedema.   07/28/22: Therapeutic Exercises Pulleys into flexion and abd x 2 mins each returning therapist demo and VC's to decrease Rt scapular compensation Roll yellow ball up wall into flexion x 10 Neuromuscular Re Ed In // bars: Heel- toe walking front x4, retro x2, [place] 2x each side, toe walking x 2 but increased pain in Lt low back and inferior to glut, heel walking x 2; slow and controlled high knee marching x 2, then standing on blue oval for contralateral hip 3 way raises into flex, abd and ext returning therapist demo and VC's throughout for decrease compensations x 10 each LE; 6" front step ups x10 each leg with 3" SLS with each rep Manual Therapy Assessed pts Rt breast as she reports noticing dimples. She does have peau d'orange present which may be from her just having completed radiation 2 weeks ago. Also her breast tissue feels supple with no fibrosis palpated. During rest of manual therapy she was educated in signs of lymphedema to be aware of like worsening peau d'orange, especially if fibrosis palpated. Also pt reports having a compression bra but has not been wearing it so educated her on importance of wearing this 24/7 as much as able over next few months during recovery from radiation. Pt able to verbalize good understanding.  P/ROM to Rt shoulder into flexion, abd and D2 to pts tolerance STM to Rt lateral trunk and lateral border of scapula where pt palpably tight.   07/22/2022 Educated in 4 post op exercises to progress Right shoulder ROM and discussed POC  PATIENT EDUCATION:  Education details: 4 post op exs; 07/28/22: Balance and bil LE strength Person educated: Patient Education method: Demonstration and Handouts, verbal and tactile cues Education comprehension: verbalized understanding and returned demonstration, will benefit from further review  HOME EXERCISE  PROGRAM: 4 post op exs for shoulder ROM  ASSESSMENT:  CLINICAL IMPRESSION:  Pt has very good right shoulder ROM and demonstrated good form with supine scapular series using yellow band.  She continues with swelling in the right breast with enlarged pores and requires review of MLD   OBJECTIVE IMPAIRMENTS: decreased knowledge of condition, decreased ROM, decreased strength, impaired UE functional use, postural dysfunction, and pain.  Educated in 4 post op exercises ACTIVITY LIMITATIONS: reach over head and rising from low chairs/floor  PARTICIPATION LIMITATIONS:  able to participate in most  PERSONAL FACTORS: Left breast cancer s/p radiation and chemotherapy are also affecting patient's functional outcome.   REHAB POTENTIAL: Good  CLINICAL DECISION MAKING: Stable/uncomplicated  EVALUATION COMPLEXITY: Low  GOALS: Goals reviewed with patient? Yes  SHORT TERM GOALS = LONG TERM GOALS: Target date: 09/02/2022  Independent with HEP for ROM and strengthening Baseline: Goal status: INITIAL  2.  Able to perform 14 sit to stands in 30 seconds for avg rating for age level to reduce risk of falls Baseline: 7 Goal status: INITIAL  3.  Right shoulder ROM WNL to resume all home activities without limiation Baseline:  Goal status: INITIAL  4.  Pt will be able to get up and down from the floor with improved ease Baseline:  Goal status: INITIAL  5. Pt will attend ABC class for education in lymphedema Baseline:  Goal status: INITIAL   PLAN:  PT FREQUENCY: 2x/week  PT DURATION: 6 weeks  PLANNED INTERVENTIONS: Therapeutic exercises, Therapeutic activity, Patient/Family education, Self Care, Joint mobilization, Orthotic/Fit training, Dry Needling, scar mobilization, Manual therapy, and Re-evaluation  PLAN FOR NEXT SESSION: check AROM, Did foam in bra help, set up for next ABC class(done May 20)  , Review breast MLD with pt, Review and cont bil LE strength and balance, Progress to  practice getting up from floor, PROM prn for Right UE ROM   Waynette Buttery, PT 08/11/2022, 3:58 PM

## 2022-08-11 NOTE — Patient Instructions (Signed)

## 2022-08-13 ENCOUNTER — Ambulatory Visit: Payer: 59 | Attending: Hematology

## 2022-08-13 DIAGNOSIS — M5442 Lumbago with sciatica, left side: Secondary | ICD-10-CM | POA: Diagnosis not present

## 2022-08-13 DIAGNOSIS — C50411 Malignant neoplasm of upper-outer quadrant of right female breast: Secondary | ICD-10-CM | POA: Diagnosis not present

## 2022-08-13 DIAGNOSIS — G8929 Other chronic pain: Secondary | ICD-10-CM | POA: Insufficient documentation

## 2022-08-13 DIAGNOSIS — M6281 Muscle weakness (generalized): Secondary | ICD-10-CM | POA: Insufficient documentation

## 2022-08-13 DIAGNOSIS — M25611 Stiffness of right shoulder, not elsewhere classified: Secondary | ICD-10-CM | POA: Diagnosis not present

## 2022-08-13 NOTE — Therapy (Signed)
OUTPATIENT PHYSICAL THERAPY ONCOLOGY TREATMENT  Patient Name: Samantha [name] MRN: [id number] DOB:[birth year], 62 y.o., female Today's Date: 08/13/2022  END OF SESSION:  PT End of Session - 08/13/22 1502     Visit Number 6    Number of Visits 12    Date for PT Re-Evaluation 09/02/22    PT Start Time 1504    PT Stop Time 1558    PT Time Calculation (min) 54 min    Activity Tolerance Patient tolerated treatment well    Behavior During Therapy [facility] for tasks assessed/performed             Past Medical History:  Diagnosis Date   Allergy    Anxiety 2015   Result of culmination of super stressful caregiving and deaths of 2 immediate family members.  Overall do pretty well.   Cancer (HCC) 2023   right breast   Depression    GERD (gastroesophageal reflux disease)    Hypothyroidism    Right carpal tunnel syndrome 09/19/2019   Thyroid disease    Past Surgical History:  Procedure Laterality Date   BREAST LUMPECTOMY WITH RADIOACTIVE SEED LOCALIZATION Right 01/08/2022   Procedure: RIGHT BREAST LUMPECTOMY WITH RADIOACTIVE SEED LOCALIZATION;  Surgeon: [name], MD;  Location: [facility];  Service: General;  Laterality: Right;   PORT-A-CATH REMOVAL Left 05/20/2022   Procedure: REMOVAL PORT-A-CATH;  Surgeon: [name], MD;  Location: [facility];  Service: General;  Laterality: Left;   PORTACATH PLACEMENT Left 01/28/2022   Procedure: INSERTION PORT-A-CATH;  Surgeon: [name], MD;  Location: [facility];  Service: General;  Laterality: Left;   SENTINEL NODE BIOPSY Right 01/28/2022   Procedure: RIGHT SENTINEL LYMPH NODE BIOPSY;  Surgeon: [name], MD;  Location: [facility];  Service: General;  Laterality: Right;   TONSILLECTOMY     Patient Active Problem List   Diagnosis Date Noted   Port-A-Cath in place 02/27/2022   Malignant neoplasm of upper-outer quadrant of right breast in female, estrogen receptor negative (HCC) 12/23/2021    Radiculopathy of lumbar region 09/26/2020   Strain of right tibialis anterior muscle 09/26/2020   Carpal tunnel syndrome 09/19/2019   Thoracic disc disorder 02/04/2018   Inflammatory heel pain 02/22/2016   Osteoarthritis, hand 02/15/2015   Elbow pain, chronic, right 01/03/2015   Cervical radiculopathy at C8 03/21/2014   Degenerative disc disease, cervical 08/18/2013   Degenerative arthritis of lumbar spine 08/18/2013   Achilles tendinitis 01/06/2012   HYPOTHYROIDISM 03/15/2009   Plantar fasciitis, right 03/15/2009   CAVUS DEFORMITY OF FOOT, ACQUIRED 03/15/2009      REFERRING PROVIDER: Malachy Mood, MD  REFERRING DIAG: LE weakness , s/p Right breast cancer  THERAPY DIAG:  Malignant neoplasm of upper-outer quadrant of right female breast, unspecified estrogen receptor status (HCC)  Muscle weakness (generalized)  Stiffness of right shoulder, not elsewhere classified  ONSET DATE: 01/08/2022  Rationale for Evaluation and Treatment: Rehabilitation  SUBJECTIVE:  SUBJECTIVE STATEMENT:  I am worn out today. I had the grandchildren yesterday and today. Breast swelling is about the same, but it doesn't feel heavy.  PERTINENT HISTORY:   Pt had right lumpectomy with SLNB on 01/08/2022 and 0/2 LN's. Pathology showed invasive and in Situ Ductal Carcinoma and prognostics were Triple negative. A port was placed at time of surgery for chemotherapy with Breast TC q 21 D Her course was complicated by pneumothorax which has resolved. Her radiation ended on 07/09/2022. Her legs are achy and weak  PAIN:  Are you having pain? 4/10 pain, tingle in back and left leg LBP.  PRECAUTIONS: right UE lymphedema risk  WEIGHT BEARING RESTRICTIONS: No  FALLS:  Has patient fallen in last 6 months? No  LIVING  ENVIRONMENT: Lives with: lives with their spouse Lives in: House/apartment Stairs: No; External: 1 steps; none Has following equipment at home: Single point cane  OCCUPATION: not working, cares for 4 grandchildren 9 months to 39 years old  LEISURE: garden, read,   HAND DOMINANCE: right   PRIOR LEVEL OF FUNCTION: Independent  PATIENT GOALS: improve Right shoulder ROM, increase leg strength   OBJECTIVE:  COGNITION: Overall cognitive status: Within functional limits for tasks assessed   PALPATION: No palpable cording on right  OBSERVATIONS / OTHER ASSESSMENTS: No visible cording  SENSATION: Light touch: Deficits right posterior arm  POSTURE: forward head, rounded shoulders  UPPER EXTREMITY AROM/PROM:  A/PROM RIGHT   eval  RIGHT 08/06/2022  Shoulder extension 53   Shoulder flexion 143 159  Shoulder abduction 145 162  Shoulder internal rotation 45   Shoulder external rotation 101     (Blank rows = not tested)  A/PROM LEFT   eval  Shoulder extension 50  Shoulder flexion 160  Shoulder abduction 154  Shoulder internal rotation 58  Shoulder external rotation 100    (Blank rows = not tested)  CERVICAL AROM: All within functional limits:      UPPER EXTREMITY STRENGTH: WFL   LOWER EXTREMITY AROM/PROM:  A/PROM Right eval  Hip flexion 4+  Hip extension Can bridge  Hip abduction 4+  Hip adduction 4+  Hip internal rotation 4  Hip external rotation 4  Knee flexion 4+  Knee extension 4+  Ankle dorsiflexion 5  Ankle plantarflexion   Ankle inversion   Ankle eversion    (Blank rows = not tested)  A/PROM LEFT eval  Hip flexion 4  Hip extension Can bridge  Hip abduction 4-  Hip adduction 3+  Hip internal rotation 4  Hip external rotation 3+  Knee flexion 4-  Knee extension 4-  Ankle dorsiflexion 4  Ankle plantarflexion   Ankle inversion   Ankle eversion    (Blank rows = not tested)    LYMPHEDEMA ASSESSMENTS:   SURGERY TYPE/DATE: right  lumpectomy 01/08/2022  NUMBER OF LYMPH NODES REMOVED: 0/2  CHEMOTHERAPY: yes, TC  RADIATION:Yes ended 07/09/2022  HORMONE TREATMENT: no  INFECTIONS: no  LYMPHEDEMA ASSESSMENTS:   LANDMARK RIGHT  eval  10 cm proximal to olecranon process 29.9  Olecranon process 26.0  10 cm proximal to ulnar styloid process 22.6  Just proximal to ulnar styloid process 14.5  Across hand at thumb web space 19.4  At base of 2nd digit 6.2  (Blank rows = not tested)  LANDMARK LEFT  eval  10 cm proximal to olecranon process 31.7  Olecranon process 25.5  10 cm proximal to ulnar styloid process 22.1  Just proximal to ulnar styloid process 14.5  Across hand  at thumb web space 18.45  At base of 2nd digit 5.85  (Blank rows = not tested)  FUNCTIONAL TESTS:  30 seconds chair stand test: 7 repetitions.  4 position balance test; able to maintain all x 10 seconds, left leg trembling slightly with SLS   L-DEX LYMPHEDEMA SCREENING:  The patient was assessed using the L-Dex machine today to produce a lymphedema index baseline score. The patient will be reassessed on a regular basis (typically every 3 months) to obtain new L-Dex scores. If the score is > 6.5 points away from his/her baseline score indicating onset of subclinical lymphedema, it will be recommended to wear a compression garment for 4 weeks, 12 hours per day and then be reassessed. If the score continues to be > 6.5 points from baseline at reassessment, we will initiate lymphedema treatment. Assessing in this manner has a 95% rate of preventing clinically significant lymphedema.    QUICK DASH SURVEY: 18%   TODAY'S TREATMENT:                                                                                                                                         DATE:    Pt permission and consent throughout each step of examination and treatment with modification and draping if requested when working on sensitive areas  08/13/2022 Soft tissue  mobilization bilateral UT, Right pectorals and lateral trunk with cocoa butter, and in SL to right scapular area with cocoa butter Supine wand flexion and scaption x 5 PROM Right shoulder flex, scaption, abduction,IR and ER MLD to right breast In supine: Short neck, 5 diaphragmatic breaths, L axillary nodes and establishment of interaxillary pathway, R inguinal nodes and establishment of axilloinguinal pathway, then R breast moving fluid towards pathways spending extra time in any areas of fibrosis then retracing all steps.  Standing heel raises x 10, marching x 10 B, sit to stand x 5. Standing on ax x 3 eyes closed feet together 20 sec each CGA of PT Step onto ax x 5 right and 5 left alternating   08/11/2022 Soft tissue mobilization bilateral UT, Right pectorals and lateral trunk with cocoa butter PROM Right shoulder flex, scaption, abduction,IR and ER  Supine scapular series with yellow band x 10 ea except sword x 5 Swelling continues at right breast; reviewed right breast MLD with verbalization  to pt. In supine: Short neck, 5 diaphragmatic breaths, L axillary nodes and establishment of interaxillary pathway, R inguinal nodes and establishment of axilloinguinal pathway, then R breast moving fluid towards pathways spending extra time in any areas of fibrosis then retracing all steps.  Updated HEP and gave yellow band  08/06/2022 Soft tissue mobilization bilateral UT, Right pectorals and lateral trunk with cocoa butter Supine wand flex and scaption x4-5 reps PROM Right shoulder flex, scaption, IR and ER Swelling continues at right breast; initiated right breast MLD with verbalization and instruction to  pt. In supine: Short neck, 5 diaphragmatic breaths, L axillary nodes and establishment of interaxillary pathway, R inguinal nodes and establishment of axilloinguinal pathway, then R breast moving fluid towards pathways spending extra time in any areas of fibrosis then retracing all steps. Pt  practiced LN activation, pathways and briefly at right breast Pt scheduled for ABC class on May 20 Measured left shoulder ROM Gave pt MLD handout for breast   07/30/2022 Soft tissue mobilization bilateral UT, Right pectorals and lateral trunk PROM Right shoulder flex, scaption, IR and ER AROM bilateral shoulder flexion, scaption, horizontal abd in supine x5 Sit to stand from chair with pillow in in x 10 with arms crossed and slowly lowering. Assessed right breast;Nipple is swollen and dark from mag trace, mildly enlarged pores; foam pad in TG soft made to cover pores and nipple and spaghetti foam also cut to make less noticeable in the day time Answered pt questions about chemo and lymphedema and advised that finger swelling on left , ankle swelling not from lymphedema.   07/28/22: Therapeutic Exercises Pulleys into flexion and abd x 2 mins each returning therapist demo and VC's to decrease Rt scapular compensation Roll yellow ball up wall into flexion x 10 Neuromuscular Re Ed In // bars: Heel- toe walking front x4, retro x2, grapevine 2x each side, toe walking x 2 but increased pain in Lt low back and inferior to glut, heel walking x 2; slow and controlled high knee marching x 2, then standing on blue oval for contralateral hip 3 way raises into flex, abd and ext returning therapist demo and VC's throughout for decrease compensations x 10 each LE; 6" front step ups x10 each leg with 3" SLS with each rep Manual Therapy Assessed pts Rt breast as she reports noticing dimples. She does have peau d'orange present which may be from her just having completed radiation 2 weeks ago. Also her breast tissue feels supple with no fibrosis palpated. During rest of manual therapy she was educated in signs of lymphedema to be aware of like worsening peau d'orange, especially if fibrosis palpated. Also pt reports having a compression bra but has not been wearing it so educated her on importance of wearing this  24/7 as much as able over next few months during recovery from radiation. Pt able to verbalize good understanding.  P/ROM to Rt shoulder into flexion, abd and D2 to pts tolerance STM to Rt lateral trunk and lateral border of scapula where pt palpably tight.   07/22/2022 Educated in 4 post op exercises to progress Right shoulder ROM and discussed POC  PATIENT EDUCATION:  Education details: 4 post op exs; 07/28/22: Balance and bil LE strength Person educated: Patient Education method: Demonstration and Handouts, verbal and tactile cues Education comprehension: verbalized understanding and returned demonstration, will benefit from further review  HOME EXERCISE PROGRAM: 4 post op exs for shoulder ROM  ASSESSMENT:  CLINICAL IMPRESSION:  Pt with multiple shortened areas in Right scapular area. Shoulder ROM progressing nicely. Fewer enlarged pores noted in right breast, but some still present.  Initiated gentle balance activities but held secondary to increased left buttock pain. Seeing MD tomorrow.   OBJECTIVE IMPAIRMENTS: decreased knowledge of condition, decreased ROM, decreased strength, impaired UE functional use, postural dysfunction, and pain.  Educated in 4 post op exercises ACTIVITY LIMITATIONS: reach over head and rising from low chairs/floor  PARTICIPATION LIMITATIONS:  able to participate in most  PERSONAL FACTORS: Left breast cancer s/p radiation and chemotherapy are also affecting  patient's functional outcome.   REHAB POTENTIAL: Good  CLINICAL DECISION MAKING: Stable/uncomplicated  EVALUATION COMPLEXITY: Low  GOALS: Goals reviewed with patient? Yes  SHORT TERM GOALS = LONG TERM GOALS: Target date: 09/02/2022  Independent with HEP for ROM and strengthening Baseline: Goal status: MET 08/13/2022 2.  Able to perform 14 sit to stands in 30 seconds for avg rating for age level to reduce risk of falls Baseline: 7 Goal status: INITIAL  3.  Right shoulder ROM WNL to resume  all home activities without limiation Baseline:  Goal status: MET 4.  Pt will be able to get up and down from the floor with improved ease Baseline:  Goal status: INITIAL  5. Pt will attend ABC class for education in lymphedema Baseline:  Goal status: INITIAL   PLAN:  PT FREQUENCY: 2x/week  PT DURATION: 6 weeks  PLANNED INTERVENTIONS: Therapeutic exercises, Therapeutic activity, Patient/Family education, Self Care, Joint mobilization, Orthotic/Fit training, Dry Needling, scar mobilization, Manual therapy, and Re-evaluation  PLAN FOR NEXT SESSION: check AROM, Did foam in bra help, set up for next ABC class(done May 20)  , Review breast MLD with pt, Review and cont bil LE strength and balance, Progress to practice getting up from floor, PROM prn for Right UE ROM   Waynette Buttery, PT 08/13/2022, 4:03 PM

## 2022-08-14 ENCOUNTER — Ambulatory Visit
Admission: RE | Admit: 2022-08-14 | Discharge: 2022-08-14 | Disposition: A | Discharge status: Home / Self Care | Payer: 59 | Source: Ambulatory Visit | Attending: Sports Medicine | Admitting: Sports Medicine

## 2022-08-14 ENCOUNTER — Ambulatory Visit (INDEPENDENT_AMBULATORY_CARE_PROVIDER_SITE_OTHER): Payer: 59 | Admitting: Sports Medicine

## 2022-08-14 VITALS — BP 124/78 | Ht 67.5 in | Wt 172.0 lb

## 2022-08-14 DIAGNOSIS — M545 Low back pain, unspecified: Secondary | ICD-10-CM | POA: Diagnosis not present

## 2022-08-14 DIAGNOSIS — G8929 Other chronic pain: Secondary | ICD-10-CM | POA: Diagnosis not present

## 2022-08-14 MED ORDER — METHOCARBAMOL 500 MG PO TABS: 500.0000 mg | ORAL_TABLET | Freq: Four times a day (QID) | ORAL | 0 refills | Status: DC | PRN

## 2022-08-14 NOTE — Progress Notes (Addendum)
MICAIAH REMILLARD - 62 y.o. female MRN [id number]  Date of birth: 1960-12-13    CHIEF COMPLAINT:   Left-sided low back pain    SUBJECTIVE:   HPI:  Pleasant 62 year old female with relatively new diagnosis of breast cancer in the last 8 months comes to clinic to be evaluated for left-sided low back pain.  She is been dealing with low back pain off and on for the last year.  In August of last year she was diagnosed with breast cancer so the back pain has taken a backseat.  She was actually in the hospital she suffered an iatrogenic pneumothorax while having a port placed that kept her in the hospital for 6 days in a hospital bed.  She says ever since then the back pain has been getting worse.  She describes it as a constant dull ache in the midline low back and to the left side of midline.  It wakes her up in the middle of the night because she cannot get comfortable.  She reports some radiation of the pain down the back of the leg to the level of the knee.  She is only tried taking Tylenol for this and is unable to take NSAIDs.  She reports she has now finished all of her chemo and radiation for breast cancer.  ROS:     See HPI  PERTINENT  PMH / PSH FH / / SH:  Past Medical, Surgical, Social, and Family History Reviewed & Updated in the EMR.  Pertinent findings include:  none  OBJECTIVE: BP 124/78   Ht 5' 7.5" (1.715 m)   Wt 172 lb (78 kg)   BMI 26.54 kg/m   Physical Exam:  Vital signs are reviewed.  GEN: Alert and oriented, NAD Pulm: Breathing unlabored PSY: normal mood, congruent affect  MSK: Lumbar spine -no obvious deformity.  No overlying skin changes.  Nontender to palpation at the upper lumbar spine levels.  Mildly tender to palpation at the spinous processes of L4 and L5.  No bony step-off.  Pain is reproduced with both forward flexion and extension.  No pain with side bending.  Positive straight leg raise bilaterally.  Positive stork test bilaterally.  Hips -tender to palpation  of the greater trochanters bilaterally.  5/5 strength with resisted hip flexion and abduction on the right.  Significantly noticeable weakness on the left side, 4/5 strength with resisted hip flexion and abduction on the left.  Negative logroll bilaterally.  Negative FADIR/FABER bilaterally.  Gait -Trendelenburg gait on the right  Leg lengths are symmetric  ASSESSMENT & PLAN:  1.  Chronic low back pain -Patient with acute on chronic worsening of low back pain.  She has significant weakness at the hip musculature which I think is playing a role with throwing off her biomechanics.  Will start her on some home exercises focusing on strengthening the hip abductors and hip flexors.  Given her recent diagnosis of breast cancer, I think is important to obtain x-rays of the lumbar spine to make sure she does not have any disease there.  For symptoms, we will start her on Robaxin to add to her Tylenol regimen hopefully giving her some relief at night.  If this is not helpful over the next few weeks, instructed her to call clinic to let us know.  If that is the case, I would start her on diazepam at night instead.  All questions were answered and she agrees to plan.  Arvella Nigh, MD PGY-4, Sports  Medicine Fellow [facility]  I observed and examined the patient with the resident and agree with assessment and plan.  Note reviewed and modified by me. Sterling Big, MD  Addendum:  I reviewed plain LS films.  No areas that looked suspicious of metastatic change to me.  She has some increase in lordosis and minor spondylolithesis.  There may be partial sacralization of a 6 th lumbar vertebrae.   This is preliminary and will await the formal radiology read.  Sterling Big, MD  08/19/22;  Radiology report showed the anterolithesis and chronic DDD issues as before but no new bony lesions.  Patient is doing better on phone contact.  Continue conservative care and start HEP again.  Sterling Big,  MD

## 2022-08-18 ENCOUNTER — Ambulatory Visit: Payer: 59

## 2022-08-20 ENCOUNTER — Ambulatory Visit: Payer: 59

## 2022-08-25 ENCOUNTER — Ambulatory Visit
Admission: RE | Admit: 2022-08-25 | Discharge: 2022-08-25 | Disposition: A | Discharge status: Home / Self Care | Payer: 59 | Source: Ambulatory Visit | Attending: Radiation Oncology | Admitting: Radiation Oncology

## 2022-08-25 ENCOUNTER — Ambulatory Visit: Payer: 59

## 2022-08-25 DIAGNOSIS — M25611 Stiffness of right shoulder, not elsewhere classified: Secondary | ICD-10-CM

## 2022-08-25 DIAGNOSIS — M6281 Muscle weakness (generalized): Secondary | ICD-10-CM | POA: Diagnosis not present

## 2022-08-25 DIAGNOSIS — G8929 Other chronic pain: Secondary | ICD-10-CM | POA: Diagnosis not present

## 2022-08-25 DIAGNOSIS — C50411 Malignant neoplasm of upper-outer quadrant of right female breast: Secondary | ICD-10-CM

## 2022-08-25 DIAGNOSIS — M5442 Lumbago with sciatica, left side: Secondary | ICD-10-CM | POA: Diagnosis not present

## 2022-08-25 NOTE — Progress Notes (Signed)
  Radiation Oncology         [phone] ________________________________  Name: Samantha [name] MRN: [id number]  Date of Service: 08/25/2022  DOB: Aug 28, 1960  Post Treatment Telephone Note  Diagnosis:   Stage IB, [id number], grade 3 triple negative invasive ductal carcinoma of the right breast    Indication for treatment:  Curative        Radiation treatment dates:   06/12/22-07/09/22   Site/dose:   The patient initially received a dose of 42.56 Gy in 16 fractions to the breast using whole-breast tangent fields. This was delivered using a 3-D conformal technique. The patient then received a boost to the seroma. This delivered an additional 8 Gy in 90fractions using a 3 field photon technique due to the depth of the seroma. The total dose was 50.56 Gy.(as documented in provider EOT note)   The patient was available for call today.   Symptoms of fatigue have improved since completing therapy.  Symptoms of skin changes have improved since completing therapy.  The patient was encouraged to avoid sun exposure in the area of prior treatment for up to one year following radiation with either sunscreen or by the style of clothing worn in the sun.  The patient has scheduled follow up with her medical oncologist Dr. Mosetta Putt for ongoing surveillance, and was encouraged to call if she develops concerns or questions regarding radiation.  This concludes the interview.   Ruel Favors, LPN

## 2022-08-25 NOTE — Therapy (Signed)
OUTPATIENT PHYSICAL THERAPY ONCOLOGY TREATMENT  Patient Name: Samantha [name] MRN: [id number] DOB:[birth year], 62 y.o., female Today's Date: 08/25/2022  END OF SESSION:  PT End of Session - 08/25/22 1502     Visit Number 7    Number of Visits 12    Date for PT Re-Evaluation 09/02/22    PT Start Time 1503    PT Stop Time 1550    PT Time Calculation (min) 47 min    Activity Tolerance Patient tolerated treatment well    Behavior During Therapy [facility] for tasks assessed/performed             Past Medical History:  Diagnosis Date   Allergy    Anxiety 2015   Result of culmination of super stressful caregiving and deaths of 2 immediate family members.  Overall do pretty well.   Cancer (HCC) 2023   right breast   Depression    GERD (gastroesophageal reflux disease)    Hypothyroidism    Right carpal tunnel syndrome 09/19/2019   Thyroid disease    Past Surgical History:  Procedure Laterality Date   BREAST LUMPECTOMY WITH RADIOACTIVE SEED LOCALIZATION Right 01/08/2022   Procedure: RIGHT BREAST LUMPECTOMY WITH RADIOACTIVE SEED LOCALIZATION;  Surgeon: [name], MD;  Location: [facility];  Service: General;  Laterality: Right;   PORT-A-CATH REMOVAL Left 05/20/2022   Procedure: REMOVAL PORT-A-CATH;  Surgeon: [name], MD;  Location: [facility];  Service: General;  Laterality: Left;   PORTACATH PLACEMENT Left 01/28/2022   Procedure: INSERTION PORT-A-CATH;  Surgeon: [name], MD;  Location: [facility];  Service: General;  Laterality: Left;   SENTINEL NODE BIOPSY Right 01/28/2022   Procedure: RIGHT SENTINEL LYMPH NODE BIOPSY;  Surgeon: [name], MD;  Location: [facility];  Service: General;  Laterality: Right;   TONSILLECTOMY     Patient Active Problem List   Diagnosis Date Noted   Port-A-Cath in place 02/27/2022   Malignant neoplasm of upper-outer quadrant of right breast in female, estrogen receptor negative (HCC) 12/23/2021    Radiculopathy of lumbar region 09/26/2020   Strain of right tibialis anterior muscle 09/26/2020   Carpal tunnel syndrome 09/19/2019   Thoracic disc disorder 02/04/2018   Inflammatory heel pain 02/22/2016   Osteoarthritis, hand 02/15/2015   Elbow pain, chronic, right 01/03/2015   Cervical radiculopathy at C8 03/21/2014   Degenerative disc disease, cervical 08/18/2013   Degenerative arthritis of lumbar spine 08/18/2013   Achilles tendinitis 01/06/2012   HYPOTHYROIDISM 03/15/2009   Plantar fasciitis, right 03/15/2009   CAVUS DEFORMITY OF FOOT, ACQUIRED 03/15/2009      REFERRING PROVIDER: Malachy Mood, MD  REFERRING DIAG: LE weakness , s/p Right breast cancer  THERAPY DIAG:  Malignant neoplasm of upper-outer quadrant of right female breast, unspecified estrogen receptor status (HCC)  Muscle weakness (generalized)  Stiffness of right shoulder, not elsewhere classified  ONSET DATE: 01/08/2022  Rationale for Evaluation and Treatment: Rehabilitation  SUBJECTIVE:  SUBJECTIVE STATEMENT:  Super tired from taking care of the kids. Not sleeping well because of my back. No breast heaviness, it just feels leathery. I feel like I am doing well with the breast MLD.  PERTINENT HISTORY:   Pt had right lumpectomy with SLNB on 01/08/2022 and 0/2 LN's. Pathology showed invasive and in Situ Ductal Carcinoma and prognostics were Triple negative. A port was placed at time of surgery for chemotherapy with Breast TC q 21 D Her course was complicated by pneumothorax which has resolved. Her radiation ended on 07/09/2022. Her legs are achy and weak  PAIN:  Are you having pain? 4/10 pain, tingle in back and left leg LBP.  PRECAUTIONS: right UE lymphedema risk  WEIGHT BEARING RESTRICTIONS: No  FALLS:  Has patient fallen in  last 6 months? No  LIVING ENVIRONMENT: Lives with: lives with their spouse Lives in: House/apartment Stairs: No; External: 1 steps; none Has following equipment at home: Single point cane  OCCUPATION: not working, cares for 4 grandchildren 9 months to 45 years old  LEISURE: garden, read,   HAND DOMINANCE: right   PRIOR LEVEL OF FUNCTION: Independent  PATIENT GOALS: improve Right shoulder ROM, increase leg strength   OBJECTIVE:  COGNITION: Overall cognitive status: Within functional limits for tasks assessed   PALPATION: No palpable cording on right  OBSERVATIONS / OTHER ASSESSMENTS: No visible cording  SENSATION: Light touch: Deficits right posterior arm  POSTURE: forward head, rounded shoulders  UPPER EXTREMITY AROM/PROM:  A/PROM RIGHT   eval  RIGHT 08/06/2022  Shoulder extension 53   Shoulder flexion 143 159  Shoulder abduction 145 162  Shoulder internal rotation 45   Shoulder external rotation 101     (Blank rows = not tested)  A/PROM LEFT   eval  Shoulder extension 50  Shoulder flexion 160  Shoulder abduction 154  Shoulder internal rotation 58  Shoulder external rotation 100    (Blank rows = not tested)  CERVICAL AROM: All within functional limits:      UPPER EXTREMITY STRENGTH: WFL   LOWER EXTREMITY AROM/PROM:  A/PROM Right eval  Hip flexion 4+  Hip extension Can bridge  Hip abduction 4+  Hip adduction 4+  Hip internal rotation 4  Hip external rotation 4  Knee flexion 4+  Knee extension 4+  Ankle dorsiflexion 5  Ankle plantarflexion   Ankle inversion   Ankle eversion    (Blank rows = not tested)  A/PROM LEFT eval  Hip flexion 4  Hip extension Can bridge  Hip abduction 4-  Hip adduction 3+  Hip internal rotation 4  Hip external rotation 3+  Knee flexion 4-  Knee extension 4-  Ankle dorsiflexion 4  Ankle plantarflexion   Ankle inversion   Ankle eversion    (Blank rows = not tested)    LYMPHEDEMA ASSESSMENTS:    SURGERY TYPE/DATE: right lumpectomy 01/08/2022  NUMBER OF LYMPH NODES REMOVED: 0/2  CHEMOTHERAPY: yes, TC  RADIATION:Yes ended 07/09/2022  HORMONE TREATMENT: no  INFECTIONS: no  LYMPHEDEMA ASSESSMENTS:   LANDMARK RIGHT  eval  10 cm proximal to olecranon process 29.9  Olecranon process 26.0  10 cm proximal to ulnar styloid process 22.6  Just proximal to ulnar styloid process 14.5  Across hand at thumb web space 19.4  At base of 2nd digit 6.2  (Blank rows = not tested)  LANDMARK LEFT  eval  10 cm proximal to olecranon process 31.7  Olecranon process 25.5  10 cm proximal to ulnar styloid process 22.1  Just proximal to ulnar styloid process 14.5  Across hand at thumb web space 18.45  At base of 2nd digit 5.85  (Blank rows = not tested)  FUNCTIONAL TESTS:  30 seconds chair stand test: 7 repetitions.  4 position balance test; able to maintain all x 10 seconds, left leg trembling slightly with SLS   L-DEX LYMPHEDEMA SCREENING:  The patient was assessed using the L-Dex machine today to produce a lymphedema index baseline score. The patient will be reassessed on a regular basis (typically every 3 months) to obtain new L-Dex scores. If the score is > 6.5 points away from his/her baseline score indicating onset of subclinical lymphedema, it will be recommended to wear a compression garment for 4 weeks, 12 hours per day and then be reassessed. If the score continues to be > 6.5 points from baseline at reassessment, we will initiate lymphedema treatment. Assessing in this manner has a 95% rate of preventing clinically significant lymphedema.    QUICK DASH SURVEY: 18%   TODAY'S TREATMENT:                                                                                                                                         DATE:    Pt permission and consent throughout each step of examination and treatment with modification and draping if requested when working on sensitive  areas  08/25/2022 Emphasis on core stability with UE and LE strength activities Ppt with bilateral shoulder flexion x scaption horizontal abd x  5 Ppt with knees apart, then together x 10 Ppt with alt arms x 10, marching legs, and alt arm and leg Ppt with clam (red) 2 x 10 Ppt with opposite hand to knee Bridging x 10 Ppt with horizontal abd with red x 10, shoulder flexion bilaterally with red, ER with red x 10 4 d Ball rolls on wall  Right UE x 15 Standing shoulder flexion, scaption, abduction, all x 5 B with stab against wall Sitting hip adductor ball squeeze x 10 Updated HEP   08/13/2022 Soft tissue mobilization bilateral UT, Right pectorals and lateral trunk with cocoa butter, and in SL to right scapular area with cocoa butter Supine wand flexion and scaption x 5 PROM Right shoulder flex, scaption, abduction,IR and ER MLD to right breast In supine: Short neck, 5 diaphragmatic breaths, L axillary nodes and establishment of interaxillary pathway, R inguinal nodes and establishment of axilloinguinal pathway, then R breast moving fluid towards pathways spending extra time in any areas of fibrosis then retracing all steps.  Standing heel raises x 10, marching x 10 B, sit to stand x 5. Standing on ax x 3 eyes closed feet together 20 sec each CGA of PT Step onto ax x 5 right and 5 left alternating   08/11/2022 Soft tissue mobilization bilateral UT, Right pectorals and lateral trunk with cocoa butter PROM Right shoulder flex, scaption, abduction,IR  and ER  Supine scapular series with yellow band x 10 ea except sword x 5 Swelling continues at right breast; reviewed right breast MLD with verbalization  to pt. In supine: Short neck, 5 diaphragmatic breaths, L axillary nodes and establishment of interaxillary pathway, R inguinal nodes and establishment of axilloinguinal pathway, then R breast moving fluid towards pathways spending extra time in any areas of fibrosis then retracing all steps.   Updated HEP and gave yellow band  08/06/2022 Soft tissue mobilization bilateral UT, Right pectorals and lateral trunk with cocoa butter Supine wand flex and scaption x4-5 reps PROM Right shoulder flex, scaption, IR and ER Swelling continues at right breast; initiated right breast MLD with verbalization and instruction to pt. In supine: Short neck, 5 diaphragmatic breaths, L axillary nodes and establishment of interaxillary pathway, R inguinal nodes and establishment of axilloinguinal pathway, then R breast moving fluid towards pathways spending extra time in any areas of fibrosis then retracing all steps. Pt practiced LN activation, pathways and briefly at right breast Pt scheduled for ABC class on May 20 Measured left shoulder ROM Gave pt MLD handout for breast   07/30/2022 Soft tissue mobilization bilateral UT, Right pectorals and lateral trunk PROM Right shoulder flex, scaption, IR and ER AROM bilateral shoulder flexion, scaption, horizontal abd in supine x5 Sit to stand from chair with pillow in in x 10 with arms crossed and slowly lowering. Assessed right breast;Nipple is swollen and dark from mag trace, mildly enlarged pores; foam pad in TG soft made to cover pores and nipple and spaghetti foam also cut to make less noticeable in the day time Answered pt questions about chemo and lymphedema and advised that finger swelling on left , ankle swelling not from lymphedema.   07/28/22: Therapeutic Exercises Pulleys into flexion and abd x 2 mins each returning therapist demo and VC's to decrease Rt scapular compensation Roll yellow ball up wall into flexion x 10 Neuromuscular Re Ed In // bars: Heel- toe walking front x4, retro x2, grapevine 2x each side, toe walking x 2 but increased pain in Lt low back and inferior to glut, heel walking x 2; slow and controlled high knee marching x 2, then standing on blue oval for contralateral hip 3 way raises into flex, abd and ext returning therapist  demo and VC's throughout for decrease compensations x 10 each LE; 6" front step ups x10 each leg with 3" SLS with each rep Manual Therapy Assessed pts Rt breast as she reports noticing dimples. She does have peau d'orange present which may be from her just having completed radiation 2 weeks ago. Also her breast tissue feels supple with no fibrosis palpated. During rest of manual therapy she was educated in signs of lymphedema to be aware of like worsening peau d'orange, especially if fibrosis palpated. Also pt reports having a compression bra but has not been wearing it so educated her on importance of wearing this 24/7 as much as able over next few months during recovery from radiation. Pt able to verbalize good understanding.  P/ROM to Rt shoulder into flexion, abd and D2 to pts tolerance STM to Rt lateral trunk and lateral border of scapula where pt palpably tight.   07/22/2022 Educated in 4 post op exercises to progress Right shoulder ROM and discussed POC  PATIENT EDUCATION:  Access Code: [email or web address] URL: [email or web address] Date: 08/25/2022 Prepared by: Alvira Monday  Exercises - Supine Bridge  - 1 x daily - 7 x weekly - 3 sets -  10 reps - Bent Knee Fallouts  - 1 x daily - 7 x weekly - 3 sets - 10 reps - Supine Posterior Pelvic Tilt  - 1 x daily - 7 x weekly - 3 sets - 10 reps - Hooklying Clamshell with Resistance  - 1 x daily - 7 x weekly - 3 sets - 10 reps - Supine Hip Adduction Isometric with Ball  - 1 x daily - 7 x weekly - 3 sets - 10 reps Education details: 4 post op exs; 07/28/22: Balance and bil LE strength Person educated: Patient Education method: Demonstration and Handouts, verbal and tactile cues Education comprehension: verbalized understanding and returned demonstration, will benefit from further review  HOME EXERCISE PROGRAM: 4 post op exs for shoulder ROM, supine scapula series, supine stab exs see aboce  ASSESSMENT:  CLINICAL IMPRESSION:  Pain in LB  no worse after activities in clinic today. Pt used good form but had to reset ppt at times to perform neutral stab exs. Showed pt how she can make shoulder TB activities stabilization activities as well.   OBJECTIVE IMPAIRMENTS: decreased knowledge of condition, decreased ROM, decreased strength, impaired UE functional use, postural dysfunction, and pain.  Educated in 4 post op exercises ACTIVITY LIMITATIONS: reach over head and rising from low chairs/floor  PARTICIPATION LIMITATIONS:  able to participate in most  PERSONAL FACTORS: Left breast cancer s/p radiation and chemotherapy are also affecting patient's functional outcome.   REHAB POTENTIAL: Good  CLINICAL DECISION MAKING: Stable/uncomplicated  EVALUATION COMPLEXITY: Low  GOALS: Goals reviewed with patient? Yes  SHORT TERM GOALS = LONG TERM GOALS: Target date: 09/02/2022  Independent with HEP for ROM and strengthening Baseline: Goal status: MET 08/13/2022 2.  Able to perform 14 sit to stands in 30 seconds for avg rating for age level to reduce risk of falls Baseline: 7 Goal status: INITIAL  3.  Right shoulder ROM WNL to resume all home activities without limiation Baseline:  Goal status: MET 4.  Pt will be able to get up and down from the floor with improved ease Baseline:  Goal status: INITIAL  5. Pt will attend ABC class for education in lymphedema Baseline:  Goal status: INITIAL   PLAN:  PT FREQUENCY: 2x/week  PT DURATION: 6 weeks  PLANNED INTERVENTIONS: Therapeutic exercises, Therapeutic activity, Patient/Family education, Self Care, Joint mobilization, Orthotic/Fit training, Dry Needling, scar mobilization, Manual therapy, and Re-evaluation  PLAN FOR NEXT SESSION: check AROM, Did foam in bra help, set up for next ABC class(done May 20)  , Review breast MLD with pt, Review and cont bil LE strength and balance, Progress to practice getting up from floor, PROM prn for Right UE ROM   Waynette Buttery,  PT 08/25/2022, 3:52 PM

## 2022-08-27 ENCOUNTER — Ambulatory Visit: Payer: 59

## 2022-09-04 ENCOUNTER — Ambulatory Visit: Payer: 59

## 2022-09-04 DIAGNOSIS — C50411 Malignant neoplasm of upper-outer quadrant of right female breast: Secondary | ICD-10-CM | POA: Diagnosis not present

## 2022-09-04 DIAGNOSIS — M25611 Stiffness of right shoulder, not elsewhere classified: Secondary | ICD-10-CM

## 2022-09-04 DIAGNOSIS — M6281 Muscle weakness (generalized): Secondary | ICD-10-CM

## 2022-09-04 DIAGNOSIS — M5442 Lumbago with sciatica, left side: Secondary | ICD-10-CM | POA: Diagnosis not present

## 2022-09-04 DIAGNOSIS — G8929 Other chronic pain: Secondary | ICD-10-CM | POA: Diagnosis not present

## 2022-09-04 NOTE — Addendum Note (Signed)
Addended by: Waynette Buttery on: 09/04/2022 06:24 PM   Modules accepted: Orders

## 2022-09-04 NOTE — Therapy (Addendum)
OUTPATIENT PHYSICAL THERAPY ONCOLOGY TREATMENT  Patient Name: Samantha [name] MRN: [id number] DOB:[birth year], 62 y.o., female Today's Date: 09/04/2022  END OF SESSION:  PT End of Session - 09/04/22 1107     Visit Number 8    Number of Visits 20    Date for PT Re-Evaluation 10/02/22    PT Start Time 1105    PT Stop Time 1200    PT Time Calculation (min) 55 min    Activity Tolerance Patient tolerated treatment well    Behavior During Therapy [facility] for tasks assessed/performed             Past Medical History:  Diagnosis Date   Allergy    Anxiety 2015   Result of culmination of super stressful caregiving and deaths of 2 immediate family members.  Overall do pretty well.   Cancer (HCC) 2023   right breast   Depression    GERD (gastroesophageal reflux disease)    Hypothyroidism    Right carpal tunnel syndrome 09/19/2019   Thyroid disease    Past Surgical History:  Procedure Laterality Date   BREAST LUMPECTOMY WITH RADIOACTIVE SEED LOCALIZATION Right 01/08/2022   Procedure: RIGHT BREAST LUMPECTOMY WITH RADIOACTIVE SEED LOCALIZATION;  Surgeon: [name], MD;  Location: [facility];  Service: General;  Laterality: Right;   PORT-A-CATH REMOVAL Left 05/20/2022   Procedure: REMOVAL PORT-A-CATH;  Surgeon: [name], MD;  Location: [facility];  Service: General;  Laterality: Left;   PORTACATH PLACEMENT Left 01/28/2022   Procedure: INSERTION PORT-A-CATH;  Surgeon: [name], MD;  Location: [facility];  Service: General;  Laterality: Left;   SENTINEL NODE BIOPSY Right 01/28/2022   Procedure: RIGHT SENTINEL LYMPH NODE BIOPSY;  Surgeon: [name], MD;  Location: [facility];  Service: General;  Laterality: Right;   TONSILLECTOMY     Patient Active Problem List   Diagnosis Date Noted   Port-A-Cath in place 02/27/2022   Malignant neoplasm of upper-outer quadrant of right breast in female, estrogen receptor negative (HCC) 12/23/2021    Radiculopathy of lumbar region 09/26/2020   Strain of right tibialis anterior muscle 09/26/2020   Carpal tunnel syndrome 09/19/2019   Thoracic disc disorder 02/04/2018   Inflammatory heel pain 02/22/2016   Osteoarthritis, hand 02/15/2015   Elbow pain, chronic, right 01/03/2015   Cervical radiculopathy at C8 03/21/2014   Degenerative disc disease, cervical 08/18/2013   Degenerative arthritis of lumbar spine 08/18/2013   Achilles tendinitis 01/06/2012   HYPOTHYROIDISM 03/15/2009   Plantar fasciitis, right 03/15/2009   CAVUS DEFORMITY OF FOOT, ACQUIRED 03/15/2009      REFERRING PROVIDER: Malachy Mood, MD  REFERRING DIAG: LE weakness , s/p Right breast cancer  THERAPY DIAG:  Malignant neoplasm of upper-outer quadrant of right female breast, unspecified estrogen receptor status (HCC)  Muscle weakness (generalized)  Stiffness of right shoulder, not elsewhere classified  ONSET DATE: 01/08/2022  Rationale for Evaluation and Treatment: Rehabilitation  SUBJECTIVE:  SUBJECTIVE STATEMENT:  I feel like I am doing really well with the breast MLD and my Rt shoulder is still a little tight but much better than when we started. I really want to keep focusing on my core and bil LE strength to help me return to being able to get through my ADLs with improved ease.    PERTINENT HISTORY:   Pt had right lumpectomy with SLNB on 01/08/2022 and 0/2 LN's. Pathology showed invasive and in Situ Ductal Carcinoma and prognostics were Triple negative. A port was placed at time of surgery for chemotherapy with Breast TC q 21 D Her course was complicated by pneumothorax which has resolved. Her radiation ended on 07/09/2022. Her legs are achy and weak  PAIN:  Are you having pain? 4/10 pain, tingle in back and left leg  LBP.  PRECAUTIONS: right UE lymphedema risk  WEIGHT BEARING RESTRICTIONS: No  FALLS:  Has patient fallen in last 6 months? No  LIVING ENVIRONMENT: Lives with: lives with their spouse Lives in: House/apartment Stairs: No; External: 1 steps; none Has following equipment at home: Single point cane  OCCUPATION: not working, cares for 4 grandchildren 9 months to 50 years old  LEISURE: garden, read,   HAND DOMINANCE: right   PRIOR LEVEL OF FUNCTION: Independent  PATIENT GOALS: improve Right shoulder ROM, increase leg strength   OBJECTIVE:  COGNITION: Overall cognitive status: Within functional limits for tasks assessed   PALPATION: No palpable cording on right  OBSERVATIONS / OTHER ASSESSMENTS: No visible cording  SENSATION: Light touch: Deficits right posterior arm  POSTURE: forward head, rounded shoulders  UPPER EXTREMITY AROM/PROM:  A/PROM RIGHT   eval  RIGHT 08/06/2022 Right 09/04/22  Shoulder extension 53  64  Shoulder flexion 143 159 169  Shoulder abduction 145 162 164  Shoulder internal rotation 45  69  Shoulder external rotation 101      (Blank rows = not tested)  A/PROM LEFT   eval  Shoulder extension 50  Shoulder flexion 160  Shoulder abduction 154  Shoulder internal rotation 58  Shoulder external rotation 100    (Blank rows = not tested)  CERVICAL AROM: All within functional limits:      UPPER EXTREMITY STRENGTH: WFL   LOWER EXTREMITY AROM/PROM:  A/PROM Right eval  Hip flexion 4+  Hip extension Can bridge  Hip abduction 4+  Hip adduction 4+  Hip internal rotation 4  Hip external rotation 4  Knee flexion 4+  Knee extension 4+  Ankle dorsiflexion 5  Ankle plantarflexion   Ankle inversion   Ankle eversion    (Blank rows = not tested)  A/PROM LEFT eval  Hip flexion 4  Hip extension Can bridge  Hip abduction 4-  Hip adduction 3+  Hip internal rotation 4  Hip external rotation 3+  Knee flexion 4-  Knee extension 4-  Ankle  dorsiflexion 4  Ankle plantarflexion   Ankle inversion   Ankle eversion    (Blank rows = not tested)    LYMPHEDEMA ASSESSMENTS:   SURGERY TYPE/DATE: right lumpectomy 01/08/2022  NUMBER OF LYMPH NODES REMOVED: 0/2  CHEMOTHERAPY: yes, TC  RADIATION:Yes ended 07/09/2022  HORMONE TREATMENT: no  INFECTIONS: no  LYMPHEDEMA ASSESSMENTS:   LANDMARK RIGHT  eval  10 cm proximal to olecranon process 29.9  Olecranon process 26.0  10 cm proximal to ulnar styloid process 22.6  Just proximal to ulnar styloid process 14.5  Across hand at thumb web space 19.4  At base of 2nd digit 6.2  (  Blank rows = not tested)  LANDMARK LEFT  eval  10 cm proximal to olecranon process 31.7  Olecranon process 25.5  10 cm proximal to ulnar styloid process 22.1  Just proximal to ulnar styloid process 14.5  Across hand at thumb web space 18.45  At base of 2nd digit 5.85  (Blank rows = not tested)  FUNCTIONAL TESTS:  30 seconds chair stand test: 7 repetitions; 09/04/22: 13 reps   4 position balance test; able to maintain all x 10 seconds, left leg trembling slightly with SLS   L-DEX LYMPHEDEMA SCREENING:  The patient was assessed using the L-Dex machine today to produce a lymphedema index baseline score. The patient will be reassessed on a regular basis (typically every 3 months) to obtain new L-Dex scores. If the score is > 6.5 points away from his/her baseline score indicating onset of subclinical lymphedema, it will be recommended to wear a compression garment for 4 weeks, 12 hours per day and then be reassessed. If the score continues to be > 6.5 points from baseline at reassessment, we will initiate lymphedema treatment. Assessing in this manner has a 95% rate of preventing clinically significant lymphedema.    QUICK DASH SURVEY: 18%   TODAY'S TREATMENT:                                                                                                                                         DATE:    09/04/22: Therapeutic Exercises Supine over half foam roll for following and with posterior pelvic tilt/core engaged throughout: Alt arm and leg tapping hand to opposite knee 2 x 10 alt with clams Clam with red theraband around knees 2 x 10 Dead bug (alt opposite hip and shoulder flexion) 2 x 10 Red Theraband and ppt used with following: Bil UE horz abd, wide grip flexion and then D2 all x 10 each  Quadruped for following with ppt: Alt UE flex x 10 each, alt LE extension x 10 each with tactile cuing to decrease compensation, then alt hip abd x 10 each also with tactile cues to decrease pelvic compensations Bridges with ball squeeze x10, attempted bridge with single leg march but Lt piriformis pain so stopped Supine Lt figure 4 stretch, then also in sitting, and bil HS stretch seated edge of mat Assessed ASIS and her Rt side was posterior/Lt anterior so used muscle energy techniques to correct malalignment with pts knees and hips at 90 degrees and having her simultaneously engaging Rt quad and Lt HS 3x, 5-7 sec holds each, then 5 bridges after. ASIS were equal after  08/25/2022 Emphasis on core stability with UE and LE strength activities Ppt with bilateral shoulder flexion x scaption horizontal abd x  5 Ppt with knees apart, then together x 10 Ppt with alt arms x 10, marching legs, and alt arm and leg Ppt with clam (red) 2 x 10 Ppt with  opposite hand to knee Bridging x 10 Ppt with horizontal abd with red x 10, shoulder flexion bilaterally with red, ER with red x 10 4 d Ball rolls on wall  Right UE x 15 Standing shoulder flexion, scaption, abduction, all x 5 B with stab against wall Sitting hip adductor ball squeeze x 10 Updated HEP   08/13/2022 Soft tissue mobilization bilateral UT, Right pectorals and lateral trunk with cocoa butter, and in SL to right scapular area with cocoa butter Supine wand flexion and scaption x 5 PROM Right shoulder flex, scaption, abduction,IR and ER MLD to  right breast In supine: Short neck, 5 diaphragmatic breaths, L axillary nodes and establishment of interaxillary pathway, R inguinal nodes and establishment of axilloinguinal pathway, then R breast moving fluid towards pathways spending extra time in any areas of fibrosis then retracing all steps.  Standing heel raises x 10, marching x 10 B, sit to stand x 5. Standing on ax x 3 eyes closed feet together 20 sec each CGA of PT Step onto ax x 5 right and 5 left alternating     PATIENT EDUCATION:  Access Code: [email or web address] URL: [email or web address] Date: 08/25/2022 Prepared by: Alvira Monday  Exercises - Supine Bridge  - 1 x daily - 7 x weekly - 3 sets - 10 reps - Bent Knee Fallouts  - 1 x daily - 7 x weekly - 3 sets - 10 reps - Supine Posterior Pelvic Tilt  - 1 x daily - 7 x weekly - 3 sets - 10 reps - Hooklying Clamshell with Resistance  - 1 x daily - 7 x weekly - 3 sets - 10 reps - Supine Hip Adduction Isometric with Ball  - 1 x daily - 7 x weekly - 3 sets - 10 reps Education details: 4 post op exs; 07/28/22: Balance and bil LE strength Person educated: Patient Education method: Demonstration and Handouts, verbal and tactile cues Education comprehension: verbalized understanding and returned demonstration, will benefit from further review  HOME EXERCISE PROGRAM: 4 post op exs for shoulder ROM, supine scapula series, supine stab exs see aboce  ASSESSMENT:  CLINICAL IMPRESSION: Renewal done this session. Pt has met initial goals for learning self breast MLD and her Rt shoulder A/ROM has improved. She has almost met her goal of 14 reps sit to stand in 30 sec, progressing towards that goal. We are now focusing on bil LE/UE strength and core stability. Pt will benefit from continued physical therapy at this time to continue progress towards ongoing goals of improving her sit to stand reps in 30 sec and ability to get up/down from floor allowing for improved ease with ADLs so she can  play with her grandchildren.    OBJECTIVE IMPAIRMENTS: decreased knowledge of condition, decreased ROM, decreased strength, impaired UE functional use, postural dysfunction, and pain.  Educated in 4 post op exercises ACTIVITY LIMITATIONS: reach over head and rising from low chairs/floor  PARTICIPATION LIMITATIONS:  able to participate in most  PERSONAL FACTORS: Left breast cancer s/p radiation and chemotherapy are also affecting patient's functional outcome.   REHAB POTENTIAL: Good  CLINICAL DECISION MAKING: Stable/uncomplicated  EVALUATION COMPLEXITY: Low  GOALS: Goals reviewed with patient? Yes  SHORT TERM GOALS = LONG TERM GOALS: Target date: 10/02/2022  Independent with HEP for ROM and strengthening Baseline: Goal status: MET 08/13/2022  2.  Able to perform 14 sit to stands in 30 seconds for avg rating for age level to reduce risk of falls Baseline: 7; 09/04/22 - 13  reps Goal status: ONGOING  3.  Right shoulder ROM WNL to resume all home activities without limiation Baseline: 09/04/22: Flex 169 and Abd 164 degrees Goal status: MET  4.  Pt will be able to get up and down from the floor with improved ease Baseline: 09/04/22: Have started strengthening but pt still struggles with this at this time due to back pain and bil LE strength; Goal status: ONGOING  5. Pt will attend ABC class for education in lymphedema Baseline: Pt attended on 09/01/22 Goal status: MET   PLAN:  PT FREQUENCY: 2x/week  PT DURATION: 6 weeks  PLANNED INTERVENTIONS: Therapeutic exercises, Therapeutic activity, Patient/Family education, Self Care, Joint mobilization, Orthotic/Fit training, Dry Needling, scar mobilization, Manual therapy, and Re-evaluation  PLAN FOR NEXT SESSION: Review and cont bil LE strength, core stability and balance activities working towards independence with HEP; Progress to practice getting up from floor, PROM prn for Rt UE   Hermenia Bers, PTA 09/04/2022, 1:23  PM

## 2022-09-09 ENCOUNTER — Ambulatory Visit: Payer: 59

## 2022-09-09 DIAGNOSIS — C50411 Malignant neoplasm of upper-outer quadrant of right female breast: Secondary | ICD-10-CM

## 2022-09-09 DIAGNOSIS — M25611 Stiffness of right shoulder, not elsewhere classified: Secondary | ICD-10-CM

## 2022-09-09 DIAGNOSIS — G8929 Other chronic pain: Secondary | ICD-10-CM | POA: Diagnosis not present

## 2022-09-09 DIAGNOSIS — M6281 Muscle weakness (generalized): Secondary | ICD-10-CM

## 2022-09-09 DIAGNOSIS — M5442 Lumbago with sciatica, left side: Secondary | ICD-10-CM | POA: Diagnosis not present

## 2022-09-09 NOTE — Therapy (Signed)
OUTPATIENT PHYSICAL THERAPY ONCOLOGY TREATMENT  Patient Name: Samantha [name] MRN: [id number] DOB:March 06, 1961, 62 y.o., female Today's Date: 09/09/2022  END OF SESSION:  PT End of Session - 09/09/22 1506     Visit Number 9    Number of Visits 20    Date for PT Re-Evaluation 10/02/22    PT Start Time 1506    PT Stop Time 1553    PT Time Calculation (min) 47 min    Activity Tolerance Patient tolerated treatment well    Behavior During Therapy [facility] for tasks assessed/performed             Past Medical History:  Diagnosis Date   Allergy    Anxiety 2015   Result of culmination of super stressful caregiving and deaths of 2 immediate family members.  Overall do pretty well.   Cancer (HCC) 2023   right breast   Depression    GERD (gastroesophageal reflux disease)    Hypothyroidism    Right carpal tunnel syndrome 09/19/2019   Thyroid disease    Past Surgical History:  Procedure Laterality Date   BREAST LUMPECTOMY WITH RADIOACTIVE SEED LOCALIZATION Right 01/08/2022   Procedure: RIGHT BREAST LUMPECTOMY WITH RADIOACTIVE SEED LOCALIZATION;  Surgeon: [name], MD;  Location: [facility];  Service: General;  Laterality: Right;   PORT-A-CATH REMOVAL Left 05/20/2022   Procedure: REMOVAL PORT-A-CATH;  Surgeon: [name], MD;  Location: [facility];  Service: General;  Laterality: Left;   PORTACATH PLACEMENT Left 01/28/2022   Procedure: INSERTION PORT-A-CATH;  Surgeon: [name], MD;  Location: [facility];  Service: General;  Laterality: Left;   SENTINEL NODE BIOPSY Right 01/28/2022   Procedure: RIGHT SENTINEL LYMPH NODE BIOPSY;  Surgeon: [name], MD;  Location:  SURGERY CENTER;  Service: General;  Laterality: Right;   TONSILLECTOMY     Patient Active Problem List   Diagnosis Date Noted   Port-A-Cath in place 02/27/2022   Malignant neoplasm of upper-outer quadrant of right breast in female, estrogen receptor negative (HCC) 12/23/2021    Radiculopathy of lumbar region 09/26/2020   Strain of right tibialis anterior muscle 09/26/2020   Carpal tunnel syndrome 09/19/2019   Thoracic disc disorder 02/04/2018   Inflammatory heel pain 02/22/2016   Osteoarthritis, hand 02/15/2015   Elbow pain, chronic, right 01/03/2015   Cervical radiculopathy at C8 03/21/2014   Degenerative disc disease, cervical 08/18/2013   Degenerative arthritis of lumbar spine 08/18/2013   Achilles tendinitis 01/06/2012   HYPOTHYROIDISM 03/15/2009   Plantar fasciitis, right 03/15/2009   CAVUS DEFORMITY OF FOOT, ACQUIRED 03/15/2009      REFERRING PROVIDER: Malachy Mood, MD  REFERRING DIAG: LE weakness , s/p Right breast cancer  THERAPY DIAG:  Malignant neoplasm of upper-outer quadrant of right female breast, unspecified estrogen receptor status (HCC)  Muscle weakness (generalized)  Stiffness of right shoulder, not elsewhere classified  Chronic left-sided low back pain with left-sided sciatica  ONSET DATE: 01/08/2022  Rationale for Evaluation and Treatment: Rehabilitation  SUBJECTIVE:  SUBJECTIVE STATEMENT: Did OK after last visit but I was tired;a good tired. No pain after exercising. Still tender in left piriformis. I can get up from the floor now but I rest my arm on the left leg to do it.  PERTINENT HISTORY:   Pt had right lumpectomy with SLNB on 01/08/2022 and 0/2 LN's. Pathology showed invasive and in Situ Ductal Carcinoma and prognostics were Triple negative. A port was placed at time of surgery for chemotherapy with Breast TC q 21 D Her course was complicated by pneumothorax which has resolved. Her radiation ended on 07/09/2022. Her legs are achy and weak  PAIN:  Are you having pain? 1-2/10 pain, tingle in back and left leg to ankle, LBP, ache behind knee.  After I do the step into my sons den repetitive times my left leg really bothers me.  PRECAUTIONS: right UE lymphedema risk  WEIGHT BEARING RESTRICTIONS: No  FALLS:  Has patient fallen in last 6 months? No  LIVING ENVIRONMENT: Lives with: lives with their spouse Lives in: House/apartment Stairs: No; External: 1 steps; none Has following equipment at home: Single point cane  OCCUPATION: not working, cares for 4 grandchildren 9 months to 44 years old  LEISURE: garden, read,   HAND DOMINANCE: right   PRIOR LEVEL OF FUNCTION: Independent  PATIENT GOALS: improve Right shoulder ROM, increase leg strength   OBJECTIVE:  COGNITION: Overall cognitive status: Within functional limits for tasks assessed   PALPATION: No palpable cording on right  OBSERVATIONS / OTHER ASSESSMENTS: No visible cording  SENSATION: Light touch: Deficits right posterior arm  POSTURE: forward head, rounded shoulders  UPPER EXTREMITY AROM/PROM:  A/PROM RIGHT   eval  RIGHT 08/06/2022 Right 09/04/22  Shoulder extension 53  64  Shoulder flexion 143 159 169  Shoulder abduction 145 162 164  Shoulder internal rotation 45  69  Shoulder external rotation 101      (Blank rows = not tested)  A/PROM LEFT   eval  Shoulder extension 50  Shoulder flexion 160  Shoulder abduction 154  Shoulder internal rotation 58  Shoulder external rotation 100    (Blank rows = not tested)  CERVICAL AROM: All within functional limits:      UPPER EXTREMITY STRENGTH: WFL   LOWER EXTREMITY AROM/PROM:  A/PROM Right eval  Hip flexion 4+  Hip extension Can bridge  Hip abduction 4+  Hip adduction 4+  Hip internal rotation 4  Hip external rotation 4  Knee flexion 4+  Knee extension 4+  Ankle dorsiflexion 5  Ankle plantarflexion   Ankle inversion   Ankle eversion    (Blank rows = not tested)  A/PROM LEFT eval  Hip flexion 4  Hip extension Can bridge  Hip abduction 4-  Hip adduction 3+  Hip internal  rotation 4  Hip external rotation 3+  Knee flexion 4-  Knee extension 4-  Ankle dorsiflexion 4  Ankle plantarflexion   Ankle inversion   Ankle eversion    (Blank rows = not tested)    LYMPHEDEMA ASSESSMENTS:   SURGERY TYPE/DATE: right lumpectomy 01/08/2022  NUMBER OF LYMPH NODES REMOVED: 0/2  CHEMOTHERAPY: yes, TC  RADIATION:Yes ended 07/09/2022  HORMONE TREATMENT: no  INFECTIONS: no  LYMPHEDEMA ASSESSMENTS:   LANDMARK RIGHT  eval  10 cm proximal to olecranon process 29.9  Olecranon process 26.0  10 cm proximal to ulnar styloid process 22.6  Just proximal to ulnar styloid process 14.5  Across hand at thumb web space 19.4  At base of 2nd  digit 6.2  (Blank rows = not tested)  LANDMARK LEFT  eval  10 cm proximal to olecranon process 31.7  Olecranon process 25.5  10 cm proximal to ulnar styloid process 22.1  Just proximal to ulnar styloid process 14.5  Across hand at thumb web space 18.45  At base of 2nd digit 5.85  (Blank rows = not tested)  FUNCTIONAL TESTS:  30 seconds chair stand test: 7 repetitions; 09/04/22: 13 reps   4 position balance test; able to maintain all x 10 seconds, left leg trembling slightly with SLS   L-DEX LYMPHEDEMA SCREENING:  The patient was assessed using the L-Dex machine today to produce a lymphedema index baseline score. The patient will be reassessed on a regular basis (typically every 3 months) to obtain new L-Dex scores. If the score is > 6.5 points away from his/her baseline score indicating onset of subclinical lymphedema, it will be recommended to wear a compression garment for 4 weeks, 12 hours per day and then be reassessed. If the score continues to be > 6.5 points from baseline at reassessment, we will initiate lymphedema treatment. Assessing in this manner has a 95% rate of preventing clinically significant lymphedema.    QUICK DASH SURVEY: 18%   TODAY'S TREATMENT:                                                                                                                                          DATE:   09/09/2022 On half foam roll core engaged Alternate arms x 5 Alternate arm to knee 2 x 5 Alternate shoulder and hip flexion 2 x 10 Red Theraband and ppt used with following: Bil UE horz abd, wide grip flexion and then D2 all x 10 each Clam on roll 2 x 10 with red band Ball squeeze between knees x 10 Table Exercises Bridge with hip abd x10 on table Bilateral piriformis figure 4 stretch x 3 supine Bilateral HS stretch x 3  Bilateral sitting figure 4 stretch x 3  Quad stabs alt UE x 5, LE x 5 Standing NM activities Standing hip and opp arm shoulder flexion x 5 ea Heel raises x 10 B no HH Tandem stance bilaterally x 2, no HH STM over pants in  right SL to left gluts 09/04/22: Therapeutic Exercises Supine over half foam roll for following and with posterior pelvic tilt/core engaged throughout: Alt arm and leg tapping hand to opposite knee 2 x 10 alt with clams Clam with red theraband around knees 2 x 10 Dead bug (alt opposite hip and shoulder flexion) 2 x 10 Red Theraband and ppt used with following: Bil UE horz abd, wide grip flexion and then D2 all x 10 each  Quadruped for following with ppt: Alt UE flex x 10 each, alt LE extension x 10 each with tactile cuing to decrease compensation, then alt hip abd x 10 each also with  tactile cues to decrease pelvic compensations Bridges with ball squeeze x10, attempted bridge with single leg march but Lt piriformis pain so stopped Supine Lt figure 4 stretch, then also in sitting, and bil HS stretch seated edge of mat Assessed ASIS and her Rt side was posterior/Lt anterior so used muscle energy techniques to correct malalignment with pts knees and hips at 90 degrees and having her simultaneously engaging Rt quad and Lt HS 3x, 5-7 sec holds each, then 5 bridges after. ASIS were equal after  08/25/2022 Emphasis on core stability with UE and LE strength activities Ppt  with bilateral shoulder flexion x scaption horizontal abd x  5 Ppt with knees apart, then together x 10 Ppt with alt arms x 10, marching legs, and alt arm and leg Ppt with clam (red) 2 x 10 Ppt with opposite hand to knee Bridging x 10 Ppt with horizontal abd with red x 10, shoulder flexion bilaterally with red, ER with red x 10 4 d Ball rolls on wall  Right UE x 15 Standing shoulder flexion, scaption, abduction, all x 5 B with stab against wall Sitting hip adductor ball squeeze x 10 Updated HEP   08/13/2022 Soft tissue mobilization bilateral UT, Right pectorals and lateral trunk with cocoa butter, and in SL to right scapular area with cocoa butter Supine wand flexion and scaption x 5 PROM Right shoulder flex, scaption, abduction,IR and ER MLD to right breast In supine: Short neck, 5 diaphragmatic breaths, L axillary nodes and establishment of interaxillary pathway, R inguinal nodes and establishment of axilloinguinal pathway, then R breast moving fluid towards pathways spending extra time in any areas of fibrosis then retracing all steps.  Standing heel raises x 10, marching x 10 B, sit to stand x 5. Standing on ax x 3 eyes closed feet together 20 sec each CGA of PT Step onto ax x 5 right and 5 left alternating     PATIENT EDUCATION:  Access Code: [email or web address] URL: [email or web address] Date: 08/25/2022 Prepared by: Alvira Monday  Exercises - Supine Bridge  - 1 x daily - 7 x weekly - 3 sets - 10 reps - Bent Knee Fallouts  - 1 x daily - 7 x weekly - 3 sets - 10 reps - Supine Posterior Pelvic Tilt  - 1 x daily - 7 x weekly - 3 sets - 10 reps - Hooklying Clamshell with Resistance  - 1 x daily - 7 x weekly - 3 sets - 10 reps - Supine Hip Adduction Isometric with Ball  - 1 x daily - 7 x weekly - 3 sets - 10 reps Education details: 4 post op exs; 07/28/22: Balance and bil LE strength Person educated: Patient Education method: Demonstration and Handouts, verbal and tactile  cues Education comprehension: verbalized understanding and returned demonstration, will benefit from further review  HOME EXERCISE PROGRAM: 4 post op exs for shoulder ROM, supine scapula series, supine stab exs see aboce  ASSESSMENT:  CLINICAL IMPRESSION: Continued supine LE/core strength and flexibility and standing balance activities. Added STM over clothing to decrease tightness and pt felt much looser after manual work  OBJECTIVE IMPAIRMENTS: decreased knowledge of condition, decreased ROM, decreased strength, impaired UE functional use, postural dysfunction, and pain.  Educated in 4 post op exercises ACTIVITY LIMITATIONS: reach over head and rising from low chairs/floor  PARTICIPATION LIMITATIONS:  able to participate in most  PERSONAL FACTORS: Left breast cancer s/p radiation and chemotherapy are also affecting patient's functional outcome.   REHAB POTENTIAL: Good  CLINICAL DECISION MAKING: Stable/uncomplicated  EVALUATION COMPLEXITY: Low  GOALS: Goals reviewed with patient? Yes  SHORT TERM GOALS = LONG TERM GOALS: Target date: 10/02/2022  Independent with HEP for ROM and strengthening Baseline: Goal status: MET 08/13/2022  2.  Able to perform 14 sit to stands in 30 seconds for avg rating for age level to reduce risk of falls Baseline: 7; 09/04/22 - 13 reps Goal status: ONGOING  3.  Right shoulder ROM WNL to resume all home activities without limiation Baseline: 09/04/22: Flex 169 and Abd 164 degrees Goal status: MET  4.  Pt will be able to get up and down from the floor with improved ease Baseline: 09/04/22: Have started strengthening but pt still struggles with this at this time due to back pain and bil LE strength; Goal status: ONGOING  5. Pt will attend ABC class for education in lymphedema Baseline: Pt attended on 09/01/22 Goal status: MET   PLAN:  PT FREQUENCY: 2x/week  PT DURATION: 6 weeks  PLANNED INTERVENTIONS: Therapeutic exercises, Therapeutic  activity, Patient/Family education, Self Care, Joint mobilization, Orthotic/Fit training, Dry Needling, scar mobilization, Manual therapy, and Re-evaluation  PLAN FOR NEXT SESSION: Review and cont bil LE strength, core stability and balance activities working towards independence with HEP; STM left gluts over clothing prn for tightness Progress to practice getting up from floor, PROM prn for Rt UE   Waynette Buttery, PT 09/09/2022, 3:57 PM

## 2022-09-16 ENCOUNTER — Ambulatory Visit: Payer: 59

## 2022-09-17 ENCOUNTER — Ambulatory Visit: Payer: 59 | Attending: Hematology

## 2022-09-17 DIAGNOSIS — C50411 Malignant neoplasm of upper-outer quadrant of right female breast: Secondary | ICD-10-CM | POA: Insufficient documentation

## 2022-09-17 DIAGNOSIS — M25611 Stiffness of right shoulder, not elsewhere classified: Secondary | ICD-10-CM | POA: Diagnosis not present

## 2022-09-17 DIAGNOSIS — M6281 Muscle weakness (generalized): Secondary | ICD-10-CM | POA: Diagnosis not present

## 2022-09-17 NOTE — Therapy (Signed)
OUTPATIENT PHYSICAL THERAPY ONCOLOGY TREATMENT  Patient Name: Samantha [name] MRN: [id number] DOB:[birth year], 62 y.o., female Today's Date: 09/17/2022  END OF SESSION:  PT End of Session - 09/17/22 1112     Visit Number 10    Number of Visits 20    Date for PT Re-Evaluation 10/02/22    PT Start Time 1109    PT Stop Time 1156    PT Time Calculation (min) 47 min    Activity Tolerance Patient tolerated treatment well    Behavior During Therapy [facility] for tasks assessed/performed             Past Medical History:  Diagnosis Date   Allergy    Anxiety 2015   Result of culmination of super stressful caregiving and deaths of 2 immediate family members.  Overall do pretty well.   Cancer (HCC) 2023   right breast   Depression    GERD (gastroesophageal reflux disease)    Hypothyroidism    Right carpal tunnel syndrome 09/19/2019   Thyroid disease    Past Surgical History:  Procedure Laterality Date   BREAST LUMPECTOMY WITH RADIOACTIVE SEED LOCALIZATION Right 01/08/2022   Procedure: RIGHT BREAST LUMPECTOMY WITH RADIOACTIVE SEED LOCALIZATION;  Surgeon: [name], MD;  Location: [facility];  Service: General;  Laterality: Right;   PORT-A-CATH REMOVAL Left 05/20/2022   Procedure: REMOVAL PORT-A-CATH;  Surgeon: [name], MD;  Location: [facility];  Service: General;  Laterality: Left;   PORTACATH PLACEMENT Left 01/28/2022   Procedure: INSERTION PORT-A-CATH;  Surgeon: [name], MD;  Location: [facility];  Service: General;  Laterality: Left;   SENTINEL NODE BIOPSY Right 01/28/2022   Procedure: RIGHT SENTINEL LYMPH NODE BIOPSY;  Surgeon: [name], MD;  Location: [facility];  Service: General;  Laterality: Right;   TONSILLECTOMY     Patient Active Problem List   Diagnosis Date Noted   Port-A-Cath in place 02/27/2022   Malignant neoplasm of upper-outer quadrant of right breast in female, estrogen receptor negative (HCC) 12/23/2021    Radiculopathy of lumbar region 09/26/2020   Strain of right tibialis anterior muscle 09/26/2020   Carpal tunnel syndrome 09/19/2019   Thoracic disc disorder 02/04/2018   Inflammatory heel pain 02/22/2016   Osteoarthritis, hand 02/15/2015   Elbow pain, chronic, right 01/03/2015   Cervical radiculopathy at C8 03/21/2014   Degenerative disc disease, cervical 08/18/2013   Degenerative arthritis of lumbar spine 08/18/2013   Achilles tendinitis 01/06/2012   HYPOTHYROIDISM 03/15/2009   Plantar fasciitis, right 03/15/2009   CAVUS DEFORMITY OF FOOT, ACQUIRED 03/15/2009      REFERRING PROVIDER: Malachy Mood, MD  REFERRING DIAG: LE weakness , s/p Right breast cancer  THERAPY DIAG:  Malignant neoplasm of upper-outer quadrant of right female breast, unspecified estrogen receptor status (HCC)  Muscle weakness (generalized)  Stiffness of right shoulder, not elsewhere classified  ONSET DATE: 01/08/2022  Rationale for Evaluation and Treatment: Rehabilitation  SUBJECTIVE:  SUBJECTIVE STATEMENT: I tried sleeping in my bed last night because I normally sleep in the recliner and I wanted to try. I only made it for 3 hours and the last 30 mins were very painful in my Lt piriformis, low back area. I found out I definitely need to sleep with the muscle relaxer also. I tried not taking that last night too ad all of it was just not working for me. I have noticed that when I sleep in the recliner I've been waking up with the sensation of my hands being stiff and swollen, though they aren't swollen, they just feel like it. I did not have that feeling when I woke up this morning though and I think it's because I was able to spend some if the night laying in bed.   PERTINENT HISTORY:   Pt had right lumpectomy with SLNB on  01/08/2022 and 0/2 LN's. Pathology showed invasive and in Situ Ductal Carcinoma and prognostics were Triple negative. A port was placed at time of surgery for chemotherapy with Breast TC q 21 D Her course was complicated by pneumothorax which has resolved. Her radiation ended on 07/09/2022. Her legs are achy and weak  PAIN:  Are you having pain? 1.5/10 pain, tingle in back and left leg to ankle, LBP, ache behind knee. After I do the step into my sons den repetitive times my left leg really bothers me.  PRECAUTIONS: right UE lymphedema risk  WEIGHT BEARING RESTRICTIONS: No  FALLS:  Has patient fallen in last 6 months? No  LIVING ENVIRONMENT: Lives with: lives with their spouse Lives in: House/apartment Stairs: No; External: 1 steps; none Has following equipment at home: Single point cane  OCCUPATION: not working, cares for 4 grandchildren 9 months to 71 years old  LEISURE: garden, read,   HAND DOMINANCE: right   PRIOR LEVEL OF FUNCTION: Independent  PATIENT GOALS: improve Right shoulder ROM, increase leg strength   OBJECTIVE:  COGNITION: Overall cognitive status: Within functional limits for tasks assessed   PALPATION: No palpable cording on right  OBSERVATIONS / OTHER ASSESSMENTS: No visible cording  SENSATION: Light touch: Deficits right posterior arm  POSTURE: forward head, rounded shoulders  UPPER EXTREMITY AROM/PROM:  A/PROM RIGHT   eval  RIGHT 08/06/2022 Right 09/04/22  Shoulder extension 53  64  Shoulder flexion 143 159 169  Shoulder abduction 145 162 164  Shoulder internal rotation 45  69  Shoulder external rotation 101      (Blank rows = not tested)  A/PROM LEFT   eval  Shoulder extension 50  Shoulder flexion 160  Shoulder abduction 154  Shoulder internal rotation 58  Shoulder external rotation 100    (Blank rows = not tested)  CERVICAL AROM: All within functional limits:      UPPER EXTREMITY STRENGTH: WFL   LOWER EXTREMITY  AROM/PROM:  A/PROM Right eval  Hip flexion 4+  Hip extension Can bridge  Hip abduction 4+  Hip adduction 4+  Hip internal rotation 4  Hip external rotation 4  Knee flexion 4+  Knee extension 4+  Ankle dorsiflexion 5  Ankle plantarflexion   Ankle inversion   Ankle eversion    (Blank rows = not tested)  A/PROM LEFT eval  Hip flexion 4  Hip extension Can bridge  Hip abduction 4-  Hip adduction 3+  Hip internal rotation 4  Hip external rotation 3+  Knee flexion 4-  Knee extension 4-  Ankle dorsiflexion 4  Ankle plantarflexion   Ankle inversion  Ankle eversion    (Blank rows = not tested)    LYMPHEDEMA ASSESSMENTS:   SURGERY TYPE/DATE: right lumpectomy 01/08/2022  NUMBER OF LYMPH NODES REMOVED: 0/2  CHEMOTHERAPY: yes, TC  RADIATION:Yes ended 07/09/2022  HORMONE TREATMENT: no  INFECTIONS: no  LYMPHEDEMA ASSESSMENTS:   LANDMARK RIGHT  eval  10 cm proximal to olecranon process 29.9  Olecranon process 26.0  10 cm proximal to ulnar styloid process 22.6  Just proximal to ulnar styloid process 14.5  Across hand at thumb web space 19.4  At base of 2nd digit 6.2  (Blank rows = not tested)  LANDMARK LEFT  eval  10 cm proximal to olecranon process 31.7  Olecranon process 25.5  10 cm proximal to ulnar styloid process 22.1  Just proximal to ulnar styloid process 14.5  Across hand at thumb web space 18.45  At base of 2nd digit 5.85  (Blank rows = not tested)  FUNCTIONAL TESTS:  30 seconds chair stand test: 7 repetitions; 09/04/22: 13 reps   4 position balance test; able to maintain all x 10 seconds, left leg trembling slightly with SLS   L-DEX LYMPHEDEMA SCREENING:  The patient was assessed using the L-Dex machine today to produce a lymphedema index baseline score. The patient will be reassessed on a regular basis (typically every 3 months) to obtain new L-Dex scores. If the score is > 6.5 points away from his/her baseline score indicating onset of  subclinical lymphedema, it will be recommended to wear a compression garment for 4 weeks, 12 hours per day and then be reassessed. If the score continues to be > 6.5 points from baseline at reassessment, we will initiate lymphedema treatment. Assessing in this manner has a 95% rate of preventing clinically significant lymphedema.    QUICK DASH SURVEY: 18%   TODAY'S TREATMENT:                                                                                                                                         DATE:  09/17/22: Therapeutic Exercises  Meeks Decompression exercises x 5, 5 sec holds, returning therapist demo and added to Med[employer] HEP Supine over half foam roll with posterior pelvic tilt engaged:  Posterior pelvic tilt x 10, 5 sec holds Alternate arm to knee x 10, then second set with 1#  Alt shoulder and hip flex 1#, 2 x 10 Clam with green theraband x 20 Green theraband and ppt: Supine scapular series x 10 each returning therapist demo for each Bridge with bolster squeeze 2 x 10 Then on mat table for bil piriformis stretches 2x each side, 60 sec each; bil HS stretch with towel 2x, 45-60 sec each Pt unstable with quadruped so with hands on chair and trunk // to floor for following: Alt LE ext with demo and cuing for ppt and to keep hips stable x 10, then alt UE flex x 5, and then  alt opposite UE flex/LE ext x 10 each with small LE ROM so as no low back discomfort, added to [employer] HEP Seated piriformis stretch 3x, 30-45 sec each  09/09/2022 On half foam roll core engaged Alternate arms x 5 Alternate arm to knee 2 x 5 Alternate shoulder and hip flexion 2 x 10 Red Theraband and ppt used with following: Bil UE horz abd, wide grip flexion and then D2 all x 10 each Clam on roll 2 x 10 with red band Ball squeeze between knees x 10 Table Exercises Bridge with hip abd x10 on table Bilateral piriformis figure 4 stretch x 3 supine Bilateral HS stretch x 3  Bilateral sitting figure  4 stretch x 3  Quad stabs alt UE x 5, LE x 5 Standing NM activities Standing hip and opp arm shoulder flexion x 5 ea Heel raises x 10 B no HH Tandem stance bilaterally x 2, no HH STM over pants in  right SL to left gluts  09/04/22: Therapeutic Exercises Supine over half foam roll for following and with posterior pelvic tilt/core engaged throughout: Alt arm and leg tapping hand to opposite knee 2 x 10 alt with clams Clam with red theraband around knees 2 x 10 Dead bug (alt opposite hip and shoulder flexion) 2 x 10 Red Theraband and ppt used with following: Bil UE horz abd, wide grip flexion and then D2 all x 10 each  Quadruped for following with ppt: Alt UE flex x 10 each, alt LE extension x 10 each with tactile cuing to decrease compensation, then alt hip abd x 10 each also with tactile cues to decrease pelvic compensations Bridges with ball squeeze x10, attempted bridge with single leg march but Lt piriformis pain so stopped Supine Lt figure 4 stretch, then also in sitting, and bil HS stretch seated edge of mat Assessed ASIS and her Rt side was posterior/Lt anterior so used muscle energy techniques to correct malalignment with pts knees and hips at 90 degrees and having her simultaneously engaging Rt quad and Lt HS 3x, 5-7 sec holds each, then 5 bridges after. ASIS were equal after     PATIENT EDUCATION:  Access Code: [email or web address] URL: [email or web address] Date: 08/25/2022 Prepared by: Alvira Monday  Exercises - Supine Bridge  - 1 x daily - 7 x weekly - 3 sets - 10 reps - Bent Knee Fallouts  - 1 x daily - 7 x weekly - 3 sets - 10 reps - Supine Posterior Pelvic Tilt  - 1 x daily - 7 x weekly - 3 sets - 10 reps - Hooklying Clamshell with Resistance  - 1 x daily - 7 x weekly - 3 sets - 10 reps - Supine Hip Adduction Isometric with Ball  - 1 x daily - 7 x weekly - 3 sets - 10 reps Added 09/17/22: - Quadruped Alternating Leg Extensions  - 1 x daily - 7 x weekly - 2 sets - 10  reps - 3 hold - Quadruped Alternating Arm Lift  - 1 x daily - 7 x weekly - 2 sets - 10 reps - 3 hold - Quadruped Pelvic Floor Contraction with Opposite Arm and Leg Lift  - 1 x daily - 7 x weekly - 2 sets - 10 reps - 3 hold - Supine Cervical Retraction with Towel  - 1 x daily - 7 x weekly - 5 reps - 5 hold - Supine Scapular Retraction  - 1 x daily - 7 x weekly - 5 reps -  5 hold - Supine Leg Press  - 1 x daily - 7 x weekly - 5 reps - 5 hold Education details: 4 post op exs; 07/28/22: Balance and bil LE strength; core stability with hands on chair and trunk // to floor  Person educated: Patient Education method: Demonstration and Handouts, verbal and tactile cues Education comprehension: verbalized understanding and returned demonstration, will benefit from further review  HOME EXERCISE PROGRAM: 4 post op exs for shoulder ROM, supine scapula series, supine stab exs see aboce  ASSESSMENT:  CLINICAL IMPRESSION: Continued with focus on core stability and postural strength. Progressed HEP to include Meeks Decompression exs and core stability leaning over on chair working towards quadruped once she can demonstrate improved core and pelvic stability. Pt reports will not be here for appts next week so plans to cont with HEP.    OBJECTIVE IMPAIRMENTS: decreased knowledge of condition, decreased ROM, decreased strength, impaired UE functional use, postural dysfunction, and pain.  Educated in 4 post op exercises ACTIVITY LIMITATIONS: reach over head and rising from low chairs/floor  PARTICIPATION LIMITATIONS:  able to participate in most  PERSONAL FACTORS: Left breast cancer s/p radiation and chemotherapy are also affecting patient's functional outcome.   REHAB POTENTIAL: Good  CLINICAL DECISION MAKING: Stable/uncomplicated  EVALUATION COMPLEXITY: Low  GOALS: Goals reviewed with patient? Yes  SHORT TERM GOALS = LONG TERM GOALS: Target date: 10/02/2022  Independent with HEP for ROM and  strengthening Baseline: Goal status: MET 08/13/2022  2.  Able to perform 14 sit to stands in 30 seconds for avg rating for age level to reduce risk of falls Baseline: 7; 09/04/22 - 13 reps Goal status: ONGOING  3.  Right shoulder ROM WNL to resume all home activities without limiation Baseline: 09/04/22: Flex 169 and Abd 164 degrees Goal status: MET  4.  Pt will be able to get up and down from the floor with improved ease Baseline: 09/04/22: Have started strengthening but pt still struggles with this at this time due to back pain and bil LE strength; Goal status: ONGOING  5. Pt will attend ABC class for education in lymphedema Baseline: Pt attended on 09/01/22 Goal status: MET   PLAN:  PT FREQUENCY: 2x/week  PT DURATION: 6 weeks  PLANNED INTERVENTIONS: Therapeutic exercises, Therapeutic activity, Patient/Family education, Self Care, Joint mobilization, Orthotic/Fit training, Dry Needling, scar mobilization, Manual therapy, and Re-evaluation  PLAN FOR NEXT SESSION: Review and cont bil LE strength, core stability and balance activities working towards independence with HEP; STM left gluts over clothing prn for tightness Progress to practice getting up from floor, PROM prn for Rt UE   Hermenia Bers, PTA 09/17/2022, 12:10 PM   Decompression Exercise     Cancer Rehab [phone]    Lie on back on firm surface, knees bent, feet flat, arms turned up, out to sides, backs of hands down. Time _5-15__ minutes. Surface: floor    1. Head Press    Bring cervical spine (neck) into neutral position (by either tucking the chin towards the chest or tilting the chin upward). Feel weight on back of head. Press head downward into supporting surface.    Hold _2-3__ seconds. Repeat _3-5__ times. Do _1-2__ times per day.  2. Shoulder Press    Start in Decompression Exercise position. Press shoulders downward towards supporting surface. Hold __2-3__ seconds while counting out  loud. Repeat _3-5___ times. Do _1-2___ times per day.   3. Leg Lengthener    Straighten one leg. Pull toes AND  forefoot toward knee, extend heel. Lengthen leg by pulling pelvis away from ribs. Hold _2-3__ seconds. Relax. Repeat __4-6__ times. Do other leg.  Surface: floor   4. Leg Press    Straighten one leg down to floor keeping leg aligned with hip. Pull toes AND forefoot toward knee; extend heel.  Press entire leg downward (as if pressing leg into sandy beach). DO NOT BEND KNEE. Hold _2-3__ seconds. Do __4-6__ times. Repeat with other leg.

## 2022-09-18 ENCOUNTER — Encounter: Payer: Self-pay | Admitting: Hematology

## 2022-09-29 ENCOUNTER — Telehealth: Payer: Self-pay | Admitting: Hematology

## 2022-09-29 NOTE — Telephone Encounter (Signed)
Contacted patient to scheduled appointments. Patient is aware of appointments that are scheduled.   

## 2022-09-30 ENCOUNTER — Ambulatory Visit: Payer: 59

## 2022-09-30 DIAGNOSIS — C50411 Malignant neoplasm of upper-outer quadrant of right female breast: Secondary | ICD-10-CM | POA: Diagnosis not present

## 2022-09-30 DIAGNOSIS — M25611 Stiffness of right shoulder, not elsewhere classified: Secondary | ICD-10-CM

## 2022-09-30 DIAGNOSIS — M6281 Muscle weakness (generalized): Secondary | ICD-10-CM | POA: Diagnosis not present

## 2022-09-30 NOTE — Therapy (Signed)
OUTPATIENT PHYSICAL THERAPY ONCOLOGY TREATMENT  Patient Name: Samantha [name] MRN: [id number] DOB:11/03/1960, 62 y.o., female Today's Date: 09/30/2022  END OF SESSION:  PT End of Session - 09/30/22 1109     Visit Number 11    Number of Visits 20    Date for PT Re-Evaluation 10/02/22    PT Start Time 1110    PT Stop Time 1155    PT Time Calculation (min) 45 min    Activity Tolerance Patient tolerated treatment well    Behavior During Therapy [facility] for tasks assessed/performed             Past Medical History:  Diagnosis Date   Allergy    Anxiety 2015   Result of culmination of super stressful caregiving and deaths of 2 immediate family members.  Overall do pretty well.   Cancer (HCC) 2023   right breast   Depression    GERD (gastroesophageal reflux disease)    Hypothyroidism    Right carpal tunnel syndrome 09/19/2019   Thyroid disease    Past Surgical History:  Procedure Laterality Date   BREAST LUMPECTOMY WITH RADIOACTIVE SEED LOCALIZATION Right 01/08/2022   Procedure: RIGHT BREAST LUMPECTOMY WITH RADIOACTIVE SEED LOCALIZATION;  Surgeon: [name], MD;  Location: [facility];  Service: General;  Laterality: Right;   PORT-A-CATH REMOVAL Left 05/20/2022   Procedure: REMOVAL PORT-A-CATH;  Surgeon: [name], MD;  Location: [facility];  Service: General;  Laterality: Left;   PORTACATH PLACEMENT Left 01/28/2022   Procedure: INSERTION PORT-A-CATH;  Surgeon: [name], MD;  Location: [facility];  Service: General;  Laterality: Left;   SENTINEL NODE BIOPSY Right 01/28/2022   Procedure: RIGHT SENTINEL LYMPH NODE BIOPSY;  Surgeon: [name], MD;  Location: [facility];  Service: General;  Laterality: Right;   TONSILLECTOMY     Patient Active Problem List   Diagnosis Date Noted   Port-A-Cath in place 02/27/2022   Malignant neoplasm of upper-outer quadrant of right breast in female, estrogen receptor negative (HCC) 12/23/2021    Radiculopathy of lumbar region 09/26/2020   Strain of right tibialis anterior muscle 09/26/2020   Carpal tunnel syndrome 09/19/2019   Thoracic disc disorder 02/04/2018   Inflammatory heel pain 02/22/2016   Osteoarthritis, hand 02/15/2015   Elbow pain, chronic, right 01/03/2015   Cervical radiculopathy at C8 03/21/2014   Degenerative disc disease, cervical 08/18/2013   Degenerative arthritis of lumbar spine 08/18/2013   Achilles tendinitis 01/06/2012   HYPOTHYROIDISM 03/15/2009   Plantar fasciitis, right 03/15/2009   CAVUS DEFORMITY OF FOOT, ACQUIRED 03/15/2009      REFERRING PROVIDER: Malachy Mood, MD  REFERRING DIAG: LE weakness , s/p Right breast cancer  THERAPY DIAG:  Malignant neoplasm of upper-outer quadrant of right female breast, unspecified estrogen receptor status (HCC)  Muscle weakness (generalized)  Stiffness of right shoulder, not elsewhere classified  ONSET DATE: 01/08/2022  Rationale for Evaluation and Treatment: Rehabilitation  SUBJECTIVE:  SUBJECTIVE STATEMENT: Things were really good, then they were really bad. I overdid it with yard work, and now I am not good. It feels like a rubber band under my right arm. It doesn't feel swollen. I have some tingling around the elbow and in my thumb. The back exercises really helped until I did the yard work.  PERTINENT HISTORY:   Pt had right lumpectomy with SLNB on 01/08/2022 and 0/2 LN's. Pathology showed invasive and in Situ Ductal Carcinoma and prognostics were Triple negative. A port was placed at time of surgery for chemotherapy with Breast TC q 21 D Her course was complicated by pneumothorax which has resolved. Her radiation ended on 07/09/2022. Her legs are achy and weak  PAIN:  PAIN:  Are you having pain? Yes NPRS scale: 2/10 LB,  axilla is just tight Pain location: LB Pain orientation: Left  PAIN TYPE: aching and tight Pain description: intermittent  Aggravating factors: sleeping in bed, steps Relieving factors: exercises,stretching   PRECAUTIONS: right UE lymphedema risk  WEIGHT BEARING RESTRICTIONS: No  FALLS:  Has patient fallen in last 6 months? No  LIVING ENVIRONMENT: Lives with: lives with their spouse Lives in: House/apartment Stairs: No; External: 1 steps; none Has following equipment at home: Single point cane  OCCUPATION: not working, cares for 4 grandchildren 9 months to 56 years old  LEISURE: garden, read,   HAND DOMINANCE: right   PRIOR LEVEL OF FUNCTION: Independent  PATIENT GOALS: improve Right shoulder ROM, increase leg strength   OBJECTIVE:  COGNITION: Overall cognitive status: Within functional limits for tasks assessed   PALPATION: No palpable cording on right  OBSERVATIONS / OTHER ASSESSMENTS: No visible cording  SENSATION: Light touch: Deficits right posterior arm  POSTURE: forward head, rounded shoulders  UPPER EXTREMITY AROM/PROM:  A/PROM RIGHT   eval  RIGHT 08/06/2022 Right 09/04/22  Shoulder extension 53  64  Shoulder flexion 143 159 169  Shoulder abduction 145 162 164  Shoulder internal rotation 45  69  Shoulder external rotation 101      (Blank rows = not tested)  A/PROM LEFT   eval  Shoulder extension 50  Shoulder flexion 160  Shoulder abduction 154  Shoulder internal rotation 58  Shoulder external rotation 100    (Blank rows = not tested)  CERVICAL AROM: All within functional limits:      UPPER EXTREMITY STRENGTH: WFL   LOWER EXTREMITY AROM/PROM:  A/PROM Right eval  Hip flexion 4+  Hip extension Can bridge  Hip abduction 4+  Hip adduction 4+  Hip internal rotation 4  Hip external rotation 4  Knee flexion 4+  Knee extension 4+  Ankle dorsiflexion 5  Ankle plantarflexion   Ankle inversion   Ankle eversion    (Blank rows =  not tested)  A/PROM LEFT eval  Hip flexion 4  Hip extension Can bridge  Hip abduction 4-  Hip adduction 3+  Hip internal rotation 4  Hip external rotation 3+  Knee flexion 4-  Knee extension 4-  Ankle dorsiflexion 4  Ankle plantarflexion   Ankle inversion   Ankle eversion    (Blank rows = not tested)    LYMPHEDEMA ASSESSMENTS:   SURGERY TYPE/DATE: right lumpectomy 01/08/2022  NUMBER OF LYMPH NODES REMOVED: 0/2  CHEMOTHERAPY: yes, TC  RADIATION:Yes ended 07/09/2022  HORMONE TREATMENT: no  INFECTIONS: no  LYMPHEDEMA ASSESSMENTS:   LANDMARK RIGHT  eval  10 cm proximal to olecranon process 29.9  Olecranon process 26.0  10 cm proximal to ulnar styloid  process 22.6  Just proximal to ulnar styloid process 14.5  Across hand at thumb web space 19.4  At base of 2nd digit 6.2  (Blank rows = not tested)  LANDMARK LEFT  eval  10 cm proximal to olecranon process 31.7  Olecranon process 25.5  10 cm proximal to ulnar styloid process 22.1  Just proximal to ulnar styloid process 14.5  Across hand at thumb web space 18.45  At base of 2nd digit 5.85  (Blank rows = not tested)  FUNCTIONAL TESTS:  30 seconds chair stand test: 7 repetitions; 09/04/22: 13 reps   4 position balance test; able to maintain all x 10 seconds, left leg trembling slightly with SLS   L-DEX LYMPHEDEMA SCREENING:  The patient was assessed using the L-Dex machine today to produce a lymphedema index baseline score. The patient will be reassessed on a regular basis (typically every 3 months) to obtain new L-Dex scores. If the score is > 6.5 points away from his/her baseline score indicating onset of subclinical lymphedema, it will be recommended to wear a compression garment for 4 weeks, 12 hours per day and then be reassessed. If the score continues to be > 6.5 points from baseline at reassessment, we will initiate lymphedema treatment. Assessing in this manner has a 95% rate of preventing clinically  significant lymphedema.    QUICK DASH SURVEY: 18%   TODAY'S TREATMENT:                                                                                                                                         DATE:  09/30/2022 MFR right axilla and upper arm area of cording with some release also to forearm with arm on pillow, NTS with wrist oscillation STM with cocoa butter to right UT, pectorals, lateral trunk and right forearm especially medial PROM right shoulder flexion, scaption, abduction. Bilateral AROM flexion, scaption, horizontal abduction x 5 Reminded pt about wall stretch and showed NTS at wall like chest stretch with arm behind and wrist oscillation    09/17/22: Therapeutic Exercises  [place] Decompression exercises x 5, 5 sec holds, returning therapist demo and added to [employer] HEP Supine over half foam roll with posterior pelvic tilt engaged:  Posterior pelvic tilt x 10, 5 sec holds Alternate arm to knee x 10, then second set with 1#  Alt shoulder and hip flex 1#, 2 x 10 Clam with green theraband x 20 Green theraband and ppt: Supine scapular series x 10 each returning therapist demo for each Bridge with bolster squeeze 2 x 10 Then on mat table for bil piriformis stretches 2x each side, 60 sec each; bil HS stretch with towel 2x, 45-60 sec each Pt unstable with quadruped so with hands on chair and trunk // to floor for following: Alt LE ext with demo and cuing for ppt and to keep hips stable x 10, then alt UE flex x  5, and then alt opposite UE flex/LE ext x 10 each with small LE ROM so as no low back discomfort, added to [employer] HEP Seated piriformis stretch 3x, 30-45 sec each  09/09/2022 On half foam roll core engaged Alternate arms x 5 Alternate arm to knee 2 x 5 Alternate shoulder and hip flexion 2 x 10 Red Theraband and ppt used with following: Bil UE horz abd, wide grip flexion and then D2 all x 10 each Clam on roll 2 x 10 with red band Ball squeeze between knees  x 10 Table Exercises Bridge with hip abd x10 on table Bilateral piriformis figure 4 stretch x 3 supine Bilateral HS stretch x 3  Bilateral sitting figure 4 stretch x 3  Quad stabs alt UE x 5, LE x 5 Standing NM activities Standing hip and opp arm shoulder flexion x 5 ea Heel raises x 10 B no HH Tandem stance bilaterally x 2, no HH STM over pants in  right SL to left gluts  09/04/22: Therapeutic Exercises Supine over half foam roll for following and with posterior pelvic tilt/core engaged throughout: Alt arm and leg tapping hand to opposite knee 2 x 10 alt with clams Clam with red theraband around knees 2 x 10 Dead bug (alt opposite hip and shoulder flexion) 2 x 10 Red Theraband and ppt used with following: Bil UE horz abd, wide grip flexion and then D2 all x 10 each  Quadruped for following with ppt: Alt UE flex x 10 each, alt LE extension x 10 each with tactile cuing to decrease compensation, then alt hip abd x 10 each also with tactile cues to decrease pelvic compensations Bridges with ball squeeze x10, attempted bridge with single leg march but Lt piriformis pain so stopped Supine Lt figure 4 stretch, then also in sitting, and bil HS stretch seated edge of mat Assessed ASIS and her Rt side was posterior/Lt anterior so used muscle energy techniques to correct malalignment with pts knees and hips at 90 degrees and having her simultaneously engaging Rt quad and Lt HS 3x, 5-7 sec holds each, then 5 bridges after. ASIS were equal after     PATIENT EDUCATION:  Access Code: [email or web address] URL: [email or web address] Date: 08/25/2022 Prepared by: Alvira Monday  Exercises - Supine Bridge  - 1 x daily - 7 x weekly - 3 sets - 10 reps - Bent Knee Fallouts  - 1 x daily - 7 x weekly - 3 sets - 10 reps - Supine Posterior Pelvic Tilt  - 1 x daily - 7 x weekly - 3 sets - 10 reps - Hooklying Clamshell with Resistance  - 1 x daily - 7 x weekly - 3 sets - 10 reps - Supine Hip Adduction  Isometric with Ball  - 1 x daily - 7 x weekly - 3 sets - 10 reps Added 09/17/22: - Quadruped Alternating Leg Extensions  - 1 x daily - 7 x weekly - 2 sets - 10 reps - 3 hold - Quadruped Alternating Arm Lift  - 1 x daily - 7 x weekly - 2 sets - 10 reps - 3 hold - Quadruped Pelvic Floor Contraction with Opposite Arm and Leg Lift  - 1 x daily - 7 x weekly - 2 sets - 10 reps - 3 hold - Supine Cervical Retraction with Towel  - 1 x daily - 7 x weekly - 5 reps - 5 hold - Supine Scapular Retraction  - 1 x daily - 7 x weekly -  5 reps - 5 hold - Supine Leg Press  - 1 x daily - 7 x weekly - 5 reps - 5 hold Education details: 4 post op exs; 07/28/22: Balance and bil LE strength; core stability with hands on chair and trunk // to floor  Person educated: Patient Education method: Demonstration and Handouts, verbal and tactile cues Education comprehension: verbalized understanding and returned demonstration, will benefit from further review  HOME EXERCISE PROGRAM: 4 post op exs for shoulder ROM, supine scapula series, supine stab exs see aboce  ASSESSMENT:  CLINICAL IMPRESSION: Pt was feeling really good and then went out and over did it with yard works for several days. She came in today with new onset of cording in the right axillary region running into the upper arm and with limitations in AROM. She was greatly improved after manual therapy today with minimal tightness  OBJECTIVE IMPAIRMENTS: decreased knowledge of condition, decreased ROM, decreased strength, impaired UE functional use, postural dysfunction, and pain.  Educated in 4 post op exercises ACTIVITY LIMITATIONS: reach over head and rising from low chairs/floor  PARTICIPATION LIMITATIONS:  able to participate in most  PERSONAL FACTORS: Left breast cancer s/p radiation and chemotherapy are also affecting patient's functional outcome.   REHAB POTENTIAL: Good  CLINICAL DECISION MAKING: Stable/uncomplicated  EVALUATION COMPLEXITY:  Low  GOALS: Goals reviewed with patient? Yes  SHORT TERM GOALS = LONG TERM GOALS: Target date: 10/02/2022  Independent with HEP for ROM and strengthening Baseline: Goal status: MET 08/13/2022  2.  Able to perform 14 sit to stands in 30 seconds for avg rating for age level to reduce risk of falls Baseline: 7; 09/04/22 - 13 reps Goal status: ONGOING  3.  Right shoulder ROM WNL to resume all home activities without limiation Baseline: 09/04/22: Flex 169 and Abd 164 degrees Goal status: MET  4.  Pt will be able to get up and down from the floor with improved ease Baseline: 09/04/22: Have started strengthening but pt still struggles with this at this time due to back pain and bil LE strength; Goal status: ONGOING  5. Pt will attend ABC class for education in lymphedema Baseline: Pt attended on 09/01/22 Goal status: MET   PLAN:  PT FREQUENCY: 2x/week  PT DURATION: 6 weeks  PLANNED INTERVENTIONS: Therapeutic exercises, Therapeutic activity, Patient/Family education, Self Care, Joint mobilization, Orthotic/Fit training, Dry Needling, scar mobilization, Manual therapy, and Re-evaluation  PLAN FOR NEXT SESSION: Recert or DC. Check right UE cording, release techniques prn, [place] UE circumference, prophylactic compression sleeve? Review and cont bil LE strength, core stability and balance activities working towards independence with HEP; STM left gluts over clothing prn for tightness Progress to practice getting up from floor, PROM prn for Rt UE   Waynette Buttery, PT 09/30/2022, 12:02 PM   Decompression Exercise     [facility] [phone]    Lie on back on firm surface, knees bent, feet flat, arms turned up, out to sides, backs of hands down. Time _5-15__ minutes. Surface: floor    1. Head Press    Bring cervical spine (neck) into neutral position (by either tucking the chin towards the chest or tilting the chin upward). Feel weight on back of head. Press head downward into  supporting surface.    Hold _2-3__ seconds. Repeat _3-5__ times. Do _1-2__ times per day.  2. Shoulder Press    Start in Decompression Exercise position. Press shoulders downward towards supporting surface. Hold __2-3__ seconds while counting out loud. Repeat _3-5___  times. Do _1-2___ times per day.   3. Leg Lengthener    Straighten one leg. Pull toes AND forefoot toward knee, extend heel. Lengthen leg by pulling pelvis away from ribs. Hold _2-3__ seconds. Relax. Repeat __4-6__ times. Do other leg.  Surface: floor   4. Leg Press    Straighten one leg down to floor keeping leg aligned with hip. Pull toes AND forefoot toward knee; extend heel.  Press entire leg downward (as if pressing leg into sandy beach). DO NOT BEND KNEE. Hold _2-3__ seconds. Do __4-6__ times. Repeat with other leg.

## 2022-10-02 ENCOUNTER — Inpatient Hospital Stay: Payer: 59 | Admitting: Hematology

## 2022-10-02 ENCOUNTER — Inpatient Hospital Stay: Payer: 59

## 2022-10-05 NOTE — Progress Notes (Unsigned)
Patient Care Team: Rodrigo Ran, MD as PCP - General (Internal Medicine) Pershing Proud, RN as Oncology Nurse Navigator Donnelly Angelica, RN as Oncology Nurse Navigator [name], MD as Consulting Physician (General Surgery) Malachy Mood, MD as Consulting Physician (Hematology) Dorothy Puffer, MD as Consulting Physician (Radiation Oncology) Felicity Coyer, MD as Consulting Physician (Endocrinology) Enid Baas, MD as Consulting Physician (Sports Medicine) Zelphia Cairo, MD as Consulting Physician (Obstetrics and Gynecology) Pollyann Samples, NP as Nurse Practitioner (Nurse Practitioner)   CHIEF COMPLAINT: Follow up hot flash, itching, and h/o right breast cancer   Oncology History Overview Note   Cancer Staging  Malignant neoplasm of upper-outer quadrant of right breast in female, estrogen receptor negative Staging form: Breast, AJCC 8th Edition - Clinical stage from 12/23/2021: cT1b, cM0, G3, ER-, PR-, HER2- - Unsigned Stage prefix: Initial diagnosis Histologic grading system: 3 grade system - Pathologic stage from 01/08/2022: Stage IB (pT1b, pN0, cM0, G3, ER-, PR-, HER2-) - Signed by Malachy Mood, MD on 01/24/2022 Stage prefix: Initial diagnosis Histologic grading system: 3 grade system Residual tumor (R): R0 - None     Malignant neoplasm of upper-outer quadrant of right breast in female, estrogen receptor negative (HCC)  12/06/2021 Mammogram   CLINICAL DATA:  Patient returns today to evaluate RIGHT breast calcifications identified on a recent screening mammogram.   EXAM: DIGITAL DIAGNOSTIC UNILATERAL RIGHT MAMMOGRAM  IMPRESSION: Grouped coarse heterogeneous calcifications within the upper-outer quadrant of the RIGHT breast, measuring 6 mm extent. These may be fibroadenomatous calcifications. Stereotactic biopsy is recommended to exclude malignancy.   12/18/2021 Initial Biopsy   Diagnosis Breast, right, needle core biopsy, upper outer quadrant, x clip - DUCTAL CARCINOMA  IN SITU, SOLID AND CRIBRIFORM TYPE WITH COMEDONECROSIS AND ASSOCIATED CALCIFICATIONS, NUCLEAR GRADE 3 OF 3 - FOCAL MICROINVASION IS PRESENT (LESS THAN 1 MM) WITH EVIDENCE OF LYMPHOVASCULAR INVASION - NECROSIS: PRESENT - CALCIFICATIONS: PRESENT - DCIS LENGTH: 7 MM IN GREATEST LINEAR DIMENSION ON FRAGMENTED CORES  PROGNOSTIC INDICATORS Results: IMMUNOHISTOCHEMICAL AND MORPHOMETRIC ANALYSIS PERFORMED MANUALLY The tumor cells are NEGATIVE for Her2 (1+). Estrogen Receptor: 0%, NEGATIVE Progesterone Receptor: 0%, NEGATIVE Proliferation Marker Ki67: 60%   12/23/2021 Initial Diagnosis   Malignant neoplasm of upper-outer quadrant of right breast in female, estrogen receptor negative (HCC)   12/23/2021 Imaging   EXAM: ULTRASOUND OF THE RIGHT AXILLA  IMPRESSION: No abnormal appearing RIGHT axillary lymph nodes.   01/08/2022 Cancer Staging   Staging form: Breast, AJCC 8th Edition - Pathologic stage from 01/08/2022: Stage IB (pT1b, pN0, cM0, G3, ER-, PR-, HER2-) - Signed by Malachy Mood, MD on 01/24/2022 Stage prefix: Initial diagnosis Histologic grading system: 3 grade system Residual tumor (R): R0 - None   02/20/2022 - 04/24/2022 Chemotherapy   Patient is on Treatment Plan : BREAST TC q21d     10/06/2022 Survivorship   SCP delivered by [name], NP      CURRENT THERAPY: Surveillance   INTERVAL HISTORY Samantha [name] returns for follow up, rescheduled from July for new issues. Last seen by Dr. Mosetta Putt 07/16/22. She sent a message that she began having hot flashes and body itching or "prickly stinging" sensation on arms, legs, scalp, and jaw after radiation, and more frequent now daily. Dr Mitzi Hansen did not feel this is related to radiation. She started Robaxin for back pain after RT, no other changes to her health/meds. She mentions that she is sensitive to some medication.   From breast cancer standpoint, she is doing well. Some cording and  occasional ache in right breast. Working with PT on LE and may  need a sleeve for gardening. Still has some word finding issues/ "chemo brain." She continues PCP and gyn check ups, getting back to her activities.   ROS  All other systems reviewed and negative  Past Medical History:  Diagnosis Date   Allergy    Anxiety 2015   Result of culmination of super stressful caregiving and deaths of 2 immediate family members.  Overall do pretty well.   Cancer [facility]) 2023   right breast   Depression    GERD (gastroesophageal reflux disease)    Hypothyroidism    Right carpal tunnel syndrome 09/19/2019   Thyroid disease      Past Surgical History:  Procedure Laterality Date   BREAST LUMPECTOMY WITH RADIOACTIVE SEED LOCALIZATION Right 01/08/2022   Procedure: RIGHT BREAST LUMPECTOMY WITH RADIOACTIVE SEED LOCALIZATION;  Surgeon: [name], MD;  Location: [facility] OR;  Service: General;  Laterality: Right;   PORT-A-CATH REMOVAL Left 05/20/2022   Procedure: REMOVAL PORT-A-CATH;  Surgeon: [name], MD;  Location: [facility];  Service: General;  Laterality: Left;   PORTACATH PLACEMENT Left 01/28/2022   Procedure: INSERTION PORT-A-CATH;  Surgeon: [name], MD;  Location: [facility];  Service: General;  Laterality: Left;   SENTINEL NODE BIOPSY Right 01/28/2022   Procedure: RIGHT SENTINEL LYMPH NODE BIOPSY;  Surgeon: [name], MD;  Location: [facility];  Service: General;  Laterality: Right;   TONSILLECTOMY       Outpatient Encounter Medications as of 10/06/2022  Medication Sig Note   acetaminophen (TYLENOL) 500 MG tablet Take 1,000 mg by mouth every 6 (six) hours as needed for moderate pain.    Calcium Carb-Cholecalciferol (CALCIUM-VITAMIN D3) 250-125 MG-UNIT TABS Take 1 tablet once a day    Cholecalciferol (VITAMIN D3) 1000 units CAPS Take 1,000 Units by mouth daily.    famotidine (PEPCID) 20 MG tablet Take 1 tablet (20 mg total) by mouth daily.    hydrocortisone 1 % lotion Apply 1 Application topically 2  (two) times daily.    LORazepam (ATIVAN) 0.5 MG tablet Take 0.25-0.5 mg by mouth 2 (two) times daily as needed for anxiety.    methocarbamol (ROBAXIN) 500 MG tablet Take 1 tablet (500 mg total) by mouth 4 (four) times daily as needed for muscle spasms.    sertraline (ZOLOFT) 50 MG tablet Take 50 mg by mouth daily.    TIROSINT 88 MCG CAPS Take 88 mcg by mouth See admin instructions. Take 88 mcg by mouth daily Monday-Saturday 01/02/2022: BRAND ONLY   EPINEPHrine 0.3 mg/0.3 mL IJ SOAJ injection Inject 0.3 mg into the muscle as needed for anaphylaxis. (Patient not taking: Reported on 07/22/2022)    No facility-administered encounter medications on file as of 10/06/2022.     Today's Vitals   10/06/22 1122 10/06/22 1128  BP: 139/72   Pulse: 98   Resp: 17   Temp: (!) 97.5 F (36.4 C)   TempSrc: Temporal   SpO2: 97%   Weight: 178 lb 14.4 oz (81.1 kg)   PainSc:  0-No pain   Body mass index is 27.61 kg/m.   PHYSICAL EXAM GENERAL:alert, no distress and comfortable SKIN: no rash  EYES: sclera clear NECK: without mass LYMPH:  no palpable cervical or supraclavicular lymphadenopathy  LUNGS:  normal breathing effort HEART:  no lower extremity edema ABDOMEN: abdomen soft, non-tender and normal bowel sounds NEURO: alert & oriented x 3 with fluent speech, no focal  motor/sensory deficits Breast exam: no nipple discharge or inversion. S/p R lumpectomy and radiation, areola with skin staining, incisions completely healed. Mild central breast LE. No palpable mass or nodularity in either breast or axilla that I could appreciate     CBC    Component Value Date/Time   WBC 5.2 10/06/2022 1104   WBC 10.7 (H) 01/28/2022 1819   RBC 4.59 10/06/2022 1104   HGB 13.5 10/06/2022 1104   HCT 41.6 10/06/2022 1104   PLT 246 10/06/2022 1104   MCV 90.6 10/06/2022 1104   MCH 29.4 10/06/2022 1104   MCHC 32.5 10/06/2022 1104   RDW 14.9 10/06/2022 1104   LYMPHSABS 1.5 10/06/2022 1104   MONOABS 0.3 10/06/2022 1104    EOSABS 0.0 10/06/2022 1104   BASOSABS 0.1 10/06/2022 1104     CMP     Component Value Date/Time   NA 139 10/06/2022 1104   K 3.9 10/06/2022 1104   CL 105 10/06/2022 1104   CO2 27 10/06/2022 1104   GLUCOSE 151 (H) 10/06/2022 1104   BUN 15 10/06/2022 1104   CREATININE 0.72 10/06/2022 1104   CALCIUM 9.5 10/06/2022 1104   PROT 7.0 10/06/2022 1104   ALBUMIN 4.1 10/06/2022 1104   AST 22 10/06/2022 1104   ALT 23 10/06/2022 1104   ALKPHOS 106 10/06/2022 1104   BILITOT 0.5 10/06/2022 1104   GFRNONAA >60 10/06/2022 1104   GFRAA >90 03/09/2013 2327     ASSESSMENT & PLAN:Samantha [name] is a 62 y.o. female with    Hot flash/flushing and pruritus -She is postmenopausal, not on antiestrogen, this does not appear to be chemo related neuropathy, and no skin rash. Dr. Mitzi Hansen did not feel this is r/t radiation -I reviewed her med list, she started Robaxin when the symptoms began, this is likely the cause. I reviewed the side effect profile and these are listed -I recommend to hold Robaxin to see if symptoms improve/resolve.  If so, she will follow-up with the provider who prescribed it for an alternative.  - If these do not resolve, may be related to other source such as ?allergy from something, she can try Zyrtec or Pepcid  Malignant neoplasm of upper-outer quadrant of right breast in female, estrogen receptor negative (HCC) IDC and DCIS, Stage IA, pT1b, cN0, triple negative, Grade 3 -found on screening mammogram. S/p lumpectomy on 01/08/22 with Dr. Donell Beers, lymph node biopsies 01/28/22 showed negative nodes (0/2).  -S/p adjuvant chemo TC q3 weeks x4 02/20/22 - 04/24/2022 and completed adjuvant RT on 07/09/2022  -Currently on surveillance  -Samantha [name] is clinically doing well from a breast cancer standpoint.  Exam with expected postradiation changes, she is working with PT, no concern for recurrence.  -Mammogram due in August, I ordered -Discussed survivorship trajectory, I will send her care plan  via MyChart -Follow-up 10/3 as previously scheduled     PLAN: -Flushing and pruritus likely 2/2 to robaxin which she started same time symptoms began, hold medication and f/up with prescribing provider -Reviewed survivorship care plan and breast cancer surveillance -Mammo due end of August -F/up 10/3 as scheduled   Orders Placed This Encounter  Procedures   MM DIAG BREAST TOMO BILATERAL    Standing Status:   Future    Standing Expiration Date:   10/06/2023    Order Specific Question:   Reason for Exam (SYMPTOM  OR DIAGNOSIS REQUIRED)    Answer:   R breast cancer 12/2021 s/p lump, chemo, RT    Order Specific Question:  Preferred imaging location?    Answer:   [facility]      All questions were answered. The patient knows to call the clinic with any problems, questions or concerns. No barriers to learning were detected. I spent 30 minutes counseling the patient face to face. The total time spent in the appointment was 40 minutes and more than 50% was on counseling, review of test results, and coordination of care.   [name], NP-C 10/06/2022

## 2022-10-06 ENCOUNTER — Inpatient Hospital Stay: Payer: 59 | Attending: Hematology

## 2022-10-06 ENCOUNTER — Inpatient Hospital Stay (HOSPITAL_BASED_OUTPATIENT_CLINIC_OR_DEPARTMENT_OTHER): Payer: 59 | Admitting: Nurse Practitioner

## 2022-10-06 ENCOUNTER — Encounter: Payer: Self-pay | Admitting: Nurse Practitioner

## 2022-10-06 ENCOUNTER — Other Ambulatory Visit: Payer: Self-pay

## 2022-10-06 VITALS — BP 139/72 | HR 98 | Temp 97.5°F | Resp 17 | Wt 178.9 lb

## 2022-10-06 DIAGNOSIS — C50411 Malignant neoplasm of upper-outer quadrant of right female breast: Secondary | ICD-10-CM | POA: Diagnosis not present

## 2022-10-06 DIAGNOSIS — Z79899 Other long term (current) drug therapy: Secondary | ICD-10-CM | POA: Diagnosis not present

## 2022-10-06 DIAGNOSIS — Z171 Estrogen receptor negative status [ER-]: Secondary | ICD-10-CM

## 2022-10-06 DIAGNOSIS — R232 Flushing: Secondary | ICD-10-CM | POA: Insufficient documentation

## 2022-10-06 DIAGNOSIS — L299 Pruritus, unspecified: Secondary | ICD-10-CM | POA: Diagnosis not present

## 2022-10-06 DIAGNOSIS — Z9089 Acquired absence of other organs: Secondary | ICD-10-CM | POA: Diagnosis not present

## 2022-10-06 DIAGNOSIS — M549 Dorsalgia, unspecified: Secondary | ICD-10-CM | POA: Diagnosis not present

## 2022-10-06 LAB — CBC WITH DIFFERENTIAL (CANCER CENTER ONLY)
Abs Immature Granulocytes: 0 10*3/uL (ref 0.00–0.07)
Basophils Absolute: 0.1 10*3/uL (ref 0.0–0.1)
Basophils Relative: 1 %
Eosinophils Absolute: 0 10*3/uL (ref 0.0–0.5)
Eosinophils Relative: 1 %
HCT: 41.6 % (ref 36.0–46.0)
Hemoglobin: 13.5 g/dL (ref 12.0–15.0)
Immature Granulocytes: 0 %
Lymphocytes Relative: 28 %
Lymphs Abs: 1.5 10*3/uL (ref 0.7–4.0)
MCH: 29.4 pg (ref 26.0–34.0)
MCHC: 32.5 g/dL (ref 30.0–36.0)
MCV: 90.6 fL (ref 80.0–100.0)
Monocytes Absolute: 0.3 10*3/uL (ref 0.1–1.0)
Monocytes Relative: 6 %
Neutro Abs: 3.4 10*3/uL (ref 1.7–7.7)
Neutrophils Relative %: 64 %
Platelet Count: 246 10*3/uL (ref 150–400)
RBC: 4.59 MIL/uL (ref 3.87–5.11)
RDW: 14.9 % (ref 11.5–15.5)
WBC Count: 5.2 10*3/uL (ref 4.0–10.5)
nRBC: 0 % (ref 0.0–0.2)

## 2022-10-06 LAB — CMP (CANCER CENTER ONLY)
ALT: 23 U/L (ref 0–44)
AST: 22 U/L (ref 15–41)
Albumin: 4.1 g/dL (ref 3.5–5.0)
Alkaline Phosphatase: 106 U/L (ref 38–126)
Anion gap: 7 (ref 5–15)
BUN: 15 mg/dL (ref 8–23)
CO2: 27 mmol/L (ref 22–32)
Calcium: 9.5 mg/dL (ref 8.9–10.3)
Chloride: 105 mmol/L (ref 98–111)
Creatinine: 0.72 mg/dL (ref 0.44–1.00)
GFR, Estimated: >60 mL/min (ref >60)
Glucose, Bld: 151 mg/dL — ABNORMAL HIGH (ref 70–99)
Potassium: 3.9 mmol/L (ref 3.5–5.1)
Sodium: 139 mmol/L (ref 135–145)
Total Bilirubin: 0.5 mg/dL (ref 0.3–1.2)
Total Protein: 7 g/dL (ref 6.5–8.1)

## 2022-10-07 ENCOUNTER — Ambulatory Visit: Payer: 59

## 2022-10-07 DIAGNOSIS — C50411 Malignant neoplasm of upper-outer quadrant of right female breast: Secondary | ICD-10-CM

## 2022-10-07 DIAGNOSIS — M6281 Muscle weakness (generalized): Secondary | ICD-10-CM | POA: Diagnosis not present

## 2022-10-07 DIAGNOSIS — M25611 Stiffness of right shoulder, not elsewhere classified: Secondary | ICD-10-CM

## 2022-10-07 NOTE — Therapy (Addendum)
OUTPATIENT PHYSICAL THERAPY ONCOLOGY TREATMENT  Patient Name: FRAIDY MCCARRICK MRN: [id number] DOB:[birth year], 62 y.o., female Today's Date: 10/07/2022  END OF SESSION:  PT End of Session - 10/07/22 1012     Visit Number 12    Number of Visits 20    Date for PT Re-Evaluation 10/02/22   D/C this visit   PT Start Time 1007    PT Stop Time 1102    PT Time Calculation (min) 55 min    Activity Tolerance Patient tolerated treatment well    Behavior During Therapy [facility] for tasks assessed/performed             Past Medical History:  Diagnosis Date   Allergy    Anxiety 2015   Result of culmination of super stressful caregiving and deaths of 2 immediate family members.  Overall do pretty well.   Cancer (HCC) 2023   right breast   Depression    GERD (gastroesophageal reflux disease)    Hypothyroidism    Right carpal tunnel syndrome 09/19/2019   Thyroid disease    Past Surgical History:  Procedure Laterality Date   BREAST LUMPECTOMY WITH RADIOACTIVE SEED LOCALIZATION Right 01/08/2022   Procedure: RIGHT BREAST LUMPECTOMY WITH RADIOACTIVE SEED LOCALIZATION;  Surgeon: [name], MD;  Location: [facility];  Service: General;  Laterality: Right;   PORT-A-CATH REMOVAL Left 05/20/2022   Procedure: REMOVAL PORT-A-CATH;  Surgeon: [name], MD;  Location: [facility];  Service: General;  Laterality: Left;   PORTACATH PLACEMENT Left 01/28/2022   Procedure: INSERTION PORT-A-CATH;  Surgeon: [name], MD;  Location: [facility];  Service: General;  Laterality: Left;   SENTINEL NODE BIOPSY Right 01/28/2022   Procedure: RIGHT SENTINEL LYMPH NODE BIOPSY;  Surgeon: [name], MD;  Location: [facility];  Service: General;  Laterality: Right;   TONSILLECTOMY     Patient Active Problem List   Diagnosis Date Noted   Port-A-Cath in place 02/27/2022   Malignant neoplasm of upper-outer quadrant of right breast in female, estrogen receptor negative (HCC)  12/23/2021   Radiculopathy of lumbar region 09/26/2020   Strain of right tibialis anterior muscle 09/26/2020   Carpal tunnel syndrome 09/19/2019   Thoracic disc disorder 02/04/2018   Inflammatory heel pain 02/22/2016   Osteoarthritis, hand 02/15/2015   Elbow pain, chronic, right 01/03/2015   Cervical radiculopathy at C8 03/21/2014   Degenerative disc disease, cervical 08/18/2013   Degenerative arthritis of lumbar spine 08/18/2013   Achilles tendinitis 01/06/2012   HYPOTHYROIDISM 03/15/2009   Plantar fasciitis, right 03/15/2009   CAVUS DEFORMITY OF FOOT, ACQUIRED 03/15/2009      REFERRING PROVIDER: Malachy Mood, MD  REFERRING DIAG: LE weakness , s/p Right breast cancer  THERAPY DIAG:  Malignant neoplasm of upper-outer quadrant of right female breast, unspecified estrogen receptor status (HCC)  Muscle weakness (generalized)  Stiffness of right shoulder, not elsewhere classified  ONSET DATE: 01/08/2022  Rationale for Evaluation and Treatment: Rehabilitation  SUBJECTIVE:  SUBJECTIVE STATEMENT: I'm feeling better than when I was here last time. The cording is much improved and I've been working on the HEP.   PERTINENT HISTORY:   Pt had right lumpectomy with SLNB on 01/08/2022 and 0/2 LN's. Pathology showed invasive and in Situ Ductal Carcinoma and prognostics were Triple negative. A port was placed at time of surgery for chemotherapy with Breast TC q 21 D Her course was complicated by pneumothorax which has resolved. Her radiation ended on 07/09/2022. Her legs are achy and weak  PAIN:  PAIN:  Are you having pain? No   PRECAUTIONS: right UE lymphedema risk  WEIGHT BEARING RESTRICTIONS: No  FALLS:  Has patient fallen in last 6 months? No  LIVING ENVIRONMENT: Lives with: lives with their  spouse Lives in: House/apartment Stairs: No; External: 1 steps; none Has following equipment at home: Single point cane  OCCUPATION: not working, cares for 4 grandchildren 9 months to 52 years old  LEISURE: garden, read,   HAND DOMINANCE: right   PRIOR LEVEL OF FUNCTION: Independent  PATIENT GOALS: improve Right shoulder ROM, increase leg strength   OBJECTIVE:  COGNITION: Overall cognitive status: Within functional limits for tasks assessed   PALPATION: No palpable cording on right  OBSERVATIONS / OTHER ASSESSMENTS: No visible cording  SENSATION: Light touch: Deficits right posterior arm  POSTURE: forward head, rounded shoulders  UPPER EXTREMITY AROM/PROM:  A/PROM RIGHT   eval  RIGHT 08/06/2022 Right 09/04/22  Shoulder extension 53  64  Shoulder flexion 143 159 169  Shoulder abduction 145 162 164  Shoulder internal rotation 45  69  Shoulder external rotation 101      (Blank rows = not tested)  A/PROM LEFT   eval  Shoulder extension 50  Shoulder flexion 160  Shoulder abduction 154  Shoulder internal rotation 58  Shoulder external rotation 100    (Blank rows = not tested)  CERVICAL AROM: All within functional limits:      UPPER EXTREMITY STRENGTH: WFL   LOWER EXTREMITY AROM/PROM:  A/PROM Right eval  Hip flexion 4+  Hip extension Can bridge  Hip abduction 4+  Hip adduction 4+  Hip internal rotation 4  Hip external rotation 4  Knee flexion 4+  Knee extension 4+  Ankle dorsiflexion 5  Ankle plantarflexion   Ankle inversion   Ankle eversion    (Blank rows = not tested)  A/PROM LEFT eval  Hip flexion 4  Hip extension Can bridge  Hip abduction 4-  Hip adduction 3+  Hip internal rotation 4  Hip external rotation 3+  Knee flexion 4-  Knee extension 4-  Ankle dorsiflexion 4  Ankle plantarflexion   Ankle inversion   Ankle eversion    (Blank rows = not tested)    LYMPHEDEMA ASSESSMENTS:   SURGERY TYPE/DATE: right lumpectomy  01/08/2022  NUMBER OF LYMPH NODES REMOVED: 0/2  CHEMOTHERAPY: yes, TC  RADIATION:Yes ended 07/09/2022  HORMONE TREATMENT: no  INFECTIONS: no  LYMPHEDEMA ASSESSMENTS:   LANDMARK RIGHT  eval  10 cm proximal to olecranon process 29.9  Olecranon process 26.0  10 cm proximal to ulnar styloid process 22.6  Just proximal to ulnar styloid process 14.5  Across hand at thumb web space 19.4  At base of 2nd digit 6.2  (Blank rows = not tested)  LANDMARK LEFT  eval  10 cm proximal to olecranon process 31.7  Olecranon process 25.5  10 cm proximal to ulnar styloid process 22.1  Just proximal to ulnar styloid process 14.5  Across  hand at thumb web space 18.45  At base of 2nd digit 5.85  (Blank rows = not tested)  FUNCTIONAL TESTS:  30 seconds chair stand test: 7 repetitions; 09/04/22: 13 reps   4 position balance test; able to maintain all x 10 seconds, left leg trembling slightly with SLS   L-DEX LYMPHEDEMA SCREENING:  The patient was assessed using the L-Dex machine today to produce a lymphedema index baseline score. The patient will be reassessed on a regular basis (typically every 3 months) to obtain new L-Dex scores. If the score is > 6.5 points away from his/her baseline score indicating onset of subclinical lymphedema, it will be recommended to wear a compression garment for 4 weeks, 12 hours per day and then be reassessed. If the score continues to be > 6.5 points from baseline at reassessment, we will initiate lymphedema treatment. Assessing in this manner has a 95% rate of preventing clinically significant lymphedema.    QUICK DASH SURVEY: 18%   TODAY'S TREATMENT:                                                                                                                                         DATE:  10/07/22: Self Care Spent time at beginning of session answering pts questions about self progression with HEP and how to incorporate HIIT cardio into her routine for safe  progression of exercises without overexerting herself or increasing her LBP. Also answered her questions about compression sleeve and bra and issued script for these so pt can get these at [facility]. Manual Therapy MFR right axilla and upper arm area of cording with some release also to forearm with arm on pillow, NTS with wrist oscillation PROM right shoulder flexion, scaption, abduction, and D2 to pts available end motions and with scapular depression throughout  09/30/2022 MFR right axilla and upper arm area of cording with some release also to forearm with arm on pillow, NTS with wrist oscillation STM with cocoa butter to right UT, pectorals, lateral trunk and right forearm especially medial PROM right shoulder flexion, scaption, abduction. Bilateral AROM flexion, scaption, horizontal abduction x 5 Reminded pt about wall stretch and showed NTS at wall like chest stretch with arm behind and wrist oscillation  09/17/22: Therapeutic Exercises  [place] Decompression exercises x 5, 5 sec holds, returning therapist demo and added to Medbridge HEP Supine over half foam roll with posterior pelvic tilt engaged:  Posterior pelvic tilt x 10, 5 sec holds Alternate arm to knee x 10, then second set with 1#  Alt shoulder and hip flex 1#, 2 x 10 Clam with green theraband x 20 Green theraband and ppt: Supine scapular series x 10 each returning therapist demo for each Bridge with bolster squeeze 2 x 10 Then on mat table for bil piriformis stretches 2x each side, 60 sec each; bil HS stretch with towel 2x,  45-60 sec each Pt unstable with quadruped so with hands on chair and trunk // to floor for following: Alt LE ext with demo and cuing for ppt and to keep hips stable x 10, then alt UE flex x 5, and then alt opposite UE flex/LE ext x 10 each with small LE ROM so as no low back discomfort, added to Medbridge HEP Seated piriformis stretch 3x, 30-45 sec each  09/09/2022 On half foam  roll core engaged Alternate arms x 5 Alternate arm to knee 2 x 5 Alternate shoulder and hip flexion 2 x 10 Red Theraband and ppt used with following: Bil UE horz abd, wide grip flexion and then D2 all x 10 each Clam on roll 2 x 10 with red band Ball squeeze between knees x 10 Table Exercises Bridge with hip abd x10 on table Bilateral piriformis figure 4 stretch x 3 supine Bilateral HS stretch x 3  Bilateral sitting figure 4 stretch x 3  Quad stabs alt UE x 5, LE x 5 Standing NM activities Standing hip and opp arm shoulder flexion x 5 ea Heel raises x 10 B no HH Tandem stance bilaterally x 2, no HH STM over pants in  right SL to left gluts      PATIENT EDUCATION:  Access Code: [email or web address] URL: [email or web address] Date: 08/25/2022 Prepared by: Alvira Monday  Exercises - Supine Bridge  - 1 x daily - 7 x weekly - 3 sets - 10 reps - Bent Knee Fallouts  - 1 x daily - 7 x weekly - 3 sets - 10 reps - Supine Posterior Pelvic Tilt  - 1 x daily - 7 x weekly - 3 sets - 10 reps - Hooklying Clamshell with Resistance  - 1 x daily - 7 x weekly - 3 sets - 10 reps - Supine Hip Adduction Isometric with Ball  - 1 x daily - 7 x weekly - 3 sets - 10 reps Added 09/17/22: - Quadruped Alternating Leg Extensions  - 1 x daily - 7 x weekly - 2 sets - 10 reps - 3 hold - Quadruped Alternating Arm Lift  - 1 x daily - 7 x weekly - 2 sets - 10 reps - 3 hold - Quadruped Pelvic Floor Contraction with Opposite Arm and Leg Lift  - 1 x daily - 7 x weekly - 2 sets - 10 reps - 3 hold - Supine Cervical Retraction with Towel  - 1 x daily - 7 x weekly - 5 reps - 5 hold - Supine Scapular Retraction  - 1 x daily - 7 x weekly - 5 reps - 5 hold - Supine Leg Press  - 1 x daily - 7 x weekly - 5 reps - 5 hold Education details: 4 post op exs; 07/28/22: Balance and bil LE strength; core stability with hands on chair and trunk // to floor  Person educated: Patient Education method: Demonstration and Handouts,  verbal and tactile cues Education comprehension: verbalized understanding and returned demonstration, will benefit from further review  HOME EXERCISE PROGRAM: 4 post op exs for shoulder ROM, supine scapula series, supine stab exs see aboce  ASSESSMENT:  CLINICAL IMPRESSION: Pt reports overall she feels she has made excellent progress and has met all goals. At this time she is ready for D/C and knows she can return to this clinic prn with a new referral and we would be happy to treat her again. Pt was issued new scripts for compression sleeve and bra and was informed  of where she can get measured for each.   OBJECTIVE IMPAIRMENTS: decreased knowledge of condition, decreased ROM, decreased strength, impaired UE functional use, postural dysfunction, and pain.  Educated in 4 post op exercises ACTIVITY LIMITATIONS: reach over head and rising from low chairs/floor  PARTICIPATION LIMITATIONS:  able to participate in most  PERSONAL FACTORS: Left breast cancer s/p radiation and chemotherapy are also affecting patient's functional outcome.   REHAB POTENTIAL: Good  CLINICAL DECISION MAKING: Stable/uncomplicated  EVALUATION COMPLEXITY: Low  GOALS: Goals reviewed with patient? Yes  SHORT TERM GOALS = LONG TERM GOALS: Target date: 10/02/2022  Independent with HEP for ROM and strengthening Baseline: Goal status: MET 08/13/2022  2.  Able to perform 14 sit to stands in 30 seconds for avg rating for age level to reduce risk of falls Baseline: 7; 09/04/22 - 13 reps; 10/07/22 - 17 reps Goal status: MET  3.  Right shoulder ROM WNL to resume all home activities without limiation Baseline: 09/04/22: Flex 169 and Abd 164 degrees Goal status: MET  4.  Pt will be able to get up and down from the floor with improved ease Baseline: 09/04/22: Have started strengthening but pt still struggles with this at this time due to back pain and bil LE strength; 10/07/22: Pt can get up from middle of the floor now  without having to pull up on furniture Goal status: MET  5. Pt will attend ABC class for education in lymphedema Baseline: Pt attended on 09/01/22 Goal status: MET   PLAN:  PT FREQUENCY: 2x/week  PT DURATION: 6 weeks  PLANNED INTERVENTIONS: Therapeutic exercises, Therapeutic activity, Patient/Family education, Self Care, Joint mobilization, Orthotic/Fit training, Dry Needling, scar mobilization, Manual therapy, and Re-evaluation  PLAN FOR NEXT SESSION: D/C this visit PHYSICAL THERAPY DISCHARGE SUMMARY  Visits from Start of Care: 12  Current functional level related to goals / functional outcomes: Achieved all goals    Remaining deficits: Mild cording not interfering with motion, continued back pain but improved with exercises   Education / Equipment: HEP   Patient agrees to discharge. Patient goals were met. Patient is being discharged due to meeting the stated rehab goals.   Hermenia Bers, PTA 10/07/2022, 12:23 PM   Decompression Exercise     Cancer Rehab [phone]    Lie on back on firm surface, knees bent, feet flat, arms turned up, out to sides, backs of hands down. Time _5-15__ minutes. Surface: floor    1. Head Press    Bring cervical spine (neck) into neutral position (by either tucking the chin towards the chest or tilting the chin upward). Feel weight on back of head. Press head downward into supporting surface.    Hold _2-3__ seconds. Repeat _3-5__ times. Do _1-2__ times per day.  2. Shoulder Press    Start in Decompression Exercise position. Press shoulders downward towards supporting surface. Hold __2-3__ seconds while counting out loud. Repeat _3-5___ times. Do _1-2___ times per day.   3. Leg Lengthener    Straighten one leg. Pull toes AND forefoot toward knee, extend heel. Lengthen leg by pulling pelvis away from ribs. Hold _2-3__ seconds. Relax. Repeat __4-6__ times. Do other leg.  Surface: floor   4. Leg Press    Straighten  one leg down to floor keeping leg aligned with hip. Pull toes AND forefoot toward knee; extend heel.  Press entire leg downward (as if pressing leg into sandy beach). DO NOT BEND KNEE. Hold _2-3__ seconds. Do __4-6__ times. Repeat with other leg.

## 2022-10-09 DIAGNOSIS — F418 Other specified anxiety disorders: Secondary | ICD-10-CM | POA: Diagnosis not present

## 2022-10-09 DIAGNOSIS — M4302 Spondylolysis, cervical region: Secondary | ICD-10-CM | POA: Diagnosis not present

## 2022-10-09 DIAGNOSIS — E039 Hypothyroidism, unspecified: Secondary | ICD-10-CM | POA: Diagnosis not present

## 2022-10-09 DIAGNOSIS — C50911 Malignant neoplasm of unspecified site of right female breast: Secondary | ICD-10-CM | POA: Diagnosis not present

## 2022-10-09 DIAGNOSIS — M1990 Unspecified osteoarthritis, unspecified site: Secondary | ICD-10-CM | POA: Diagnosis not present

## 2022-10-09 DIAGNOSIS — M542 Cervicalgia: Secondary | ICD-10-CM | POA: Diagnosis not present

## 2022-10-09 DIAGNOSIS — K219 Gastro-esophageal reflux disease without esophagitis: Secondary | ICD-10-CM | POA: Diagnosis not present

## 2022-10-09 DIAGNOSIS — R7301 Impaired fasting glucose: Secondary | ICD-10-CM | POA: Diagnosis not present

## 2022-10-09 DIAGNOSIS — E663 Overweight: Secondary | ICD-10-CM | POA: Diagnosis not present

## 2022-10-14 ENCOUNTER — Ambulatory Visit: Payer: 59

## 2022-10-20 ENCOUNTER — Encounter: Payer: 59 | Admitting: Nurse Practitioner

## 2022-10-20 ENCOUNTER — Other Ambulatory Visit: Payer: 59

## 2022-10-21 DIAGNOSIS — C50411 Malignant neoplasm of upper-outer quadrant of right female breast: Secondary | ICD-10-CM | POA: Diagnosis not present

## 2022-10-25 ENCOUNTER — Encounter: Payer: Self-pay | Admitting: Nurse Practitioner

## 2022-11-05 ENCOUNTER — Encounter: Payer: Self-pay | Admitting: Hematology

## 2022-11-05 DIAGNOSIS — C50411 Malignant neoplasm of upper-outer quadrant of right female breast: Secondary | ICD-10-CM | POA: Diagnosis not present

## 2022-11-06 DIAGNOSIS — C50411 Malignant neoplasm of upper-outer quadrant of right female breast: Secondary | ICD-10-CM | POA: Diagnosis not present

## 2022-11-07 ENCOUNTER — Telehealth: Payer: Self-pay

## 2022-11-07 NOTE — Telephone Encounter (Signed)
Successful fax transmission for signed DME order.

## 2022-11-11 DIAGNOSIS — R232 Flushing: Secondary | ICD-10-CM | POA: Diagnosis not present

## 2022-11-11 DIAGNOSIS — F418 Other specified anxiety disorders: Secondary | ICD-10-CM | POA: Diagnosis not present

## 2022-11-20 DIAGNOSIS — R635 Abnormal weight gain: Secondary | ICD-10-CM | POA: Diagnosis not present

## 2022-11-20 DIAGNOSIS — E039 Hypothyroidism, unspecified: Secondary | ICD-10-CM | POA: Diagnosis not present

## 2022-11-25 DIAGNOSIS — E663 Overweight: Secondary | ICD-10-CM | POA: Diagnosis not present

## 2022-11-25 DIAGNOSIS — Z6828 Body mass index (BMI) 28.0-28.9, adult: Secondary | ICD-10-CM | POA: Diagnosis not present

## 2022-11-25 DIAGNOSIS — E7800 Pure hypercholesterolemia, unspecified: Secondary | ICD-10-CM | POA: Diagnosis not present

## 2022-11-25 DIAGNOSIS — E039 Hypothyroidism, unspecified: Secondary | ICD-10-CM | POA: Diagnosis not present

## 2022-11-25 DIAGNOSIS — R7303 Prediabetes: Secondary | ICD-10-CM | POA: Diagnosis not present

## 2022-12-02 ENCOUNTER — Ambulatory Visit
Admission: RE | Admit: 2022-12-02 | Discharge: 2022-12-02 | Disposition: A | Discharge status: Home / Self Care | Payer: 59 | Source: Ambulatory Visit | Attending: Nurse Practitioner | Admitting: Nurse Practitioner

## 2022-12-02 ENCOUNTER — Other Ambulatory Visit: Payer: Self-pay | Admitting: Nurse Practitioner

## 2022-12-02 DIAGNOSIS — Z01419 Encounter for gynecological examination (general) (routine) without abnormal findings: Secondary | ICD-10-CM | POA: Diagnosis not present

## 2022-12-02 DIAGNOSIS — Z853 Personal history of malignant neoplasm of breast: Secondary | ICD-10-CM | POA: Diagnosis not present

## 2022-12-02 DIAGNOSIS — R921 Mammographic calcification found on diagnostic imaging of breast: Secondary | ICD-10-CM | POA: Diagnosis not present

## 2022-12-02 DIAGNOSIS — C50411 Malignant neoplasm of upper-outer quadrant of right female breast: Secondary | ICD-10-CM

## 2022-12-02 DIAGNOSIS — R61 Generalized hyperhidrosis: Secondary | ICD-10-CM | POA: Diagnosis not present

## 2022-12-02 DIAGNOSIS — Z6828 Body mass index (BMI) 28.0-28.9, adult: Secondary | ICD-10-CM | POA: Diagnosis not present

## 2022-12-04 ENCOUNTER — Ambulatory Visit
Admission: RE | Admit: 2022-12-04 | Discharge: 2022-12-04 | Disposition: A | Discharge status: Home / Self Care | Payer: 59 | Source: Ambulatory Visit | Attending: Nurse Practitioner | Admitting: Nurse Practitioner

## 2022-12-04 DIAGNOSIS — R921 Mammographic calcification found on diagnostic imaging of breast: Secondary | ICD-10-CM

## 2022-12-04 HISTORY — PX: BREAST BIOPSY: SHX20

## 2022-12-08 ENCOUNTER — Telehealth: Payer: Self-pay | Admitting: Hematology

## 2022-12-09 ENCOUNTER — Inpatient Hospital Stay: Payer: 59 | Attending: Hematology

## 2022-12-09 ENCOUNTER — Inpatient Hospital Stay (HOSPITAL_BASED_OUTPATIENT_CLINIC_OR_DEPARTMENT_OTHER): Payer: 59 | Admitting: Hematology

## 2022-12-09 VITALS — BP 145/87 | HR 98 | Temp 98.2°F | Resp 16 | Wt 178.6 lb

## 2022-12-09 DIAGNOSIS — D0512 Intraductal carcinoma in situ of left breast: Secondary | ICD-10-CM | POA: Insufficient documentation

## 2022-12-09 DIAGNOSIS — Z171 Estrogen receptor negative status [ER-]: Secondary | ICD-10-CM

## 2022-12-09 DIAGNOSIS — N6331 Unspecified lump in axillary tail of the right breast: Secondary | ICD-10-CM | POA: Diagnosis not present

## 2022-12-09 DIAGNOSIS — C50411 Malignant neoplasm of upper-outer quadrant of right female breast: Secondary | ICD-10-CM | POA: Insufficient documentation

## 2022-12-09 DIAGNOSIS — Z9089 Acquired absence of other organs: Secondary | ICD-10-CM | POA: Insufficient documentation

## 2022-12-09 DIAGNOSIS — R232 Flushing: Secondary | ICD-10-CM | POA: Insufficient documentation

## 2022-12-09 DIAGNOSIS — Z79890 Hormone replacement therapy (postmenopausal): Secondary | ICD-10-CM | POA: Insufficient documentation

## 2022-12-09 DIAGNOSIS — Z882 Allergy status to sulfonamides status: Secondary | ICD-10-CM | POA: Diagnosis not present

## 2022-12-09 DIAGNOSIS — Z888 Allergy status to other drugs, medicaments and biological substances status: Secondary | ICD-10-CM | POA: Diagnosis not present

## 2022-12-09 DIAGNOSIS — Z79899 Other long term (current) drug therapy: Secondary | ICD-10-CM | POA: Insufficient documentation

## 2022-12-09 LAB — CMP (CANCER CENTER ONLY)
ALT: 23 U/L (ref 0–44)
AST: 21 U/L (ref 15–41)
Albumin: 4.6 g/dL (ref 3.5–5.0)
Alkaline Phosphatase: 113 U/L (ref 38–126)
Anion gap: 8 (ref 5–15)
BUN: 14 mg/dL (ref 8–23)
CO2: 28 mmol/L (ref 22–32)
Calcium: 9.9 mg/dL (ref 8.9–10.3)
Chloride: 105 mmol/L (ref 98–111)
Creatinine: 0.67 mg/dL (ref 0.44–1.00)
GFR, Estimated: >60 mL/min (ref >60)
Glucose, Bld: 132 mg/dL — ABNORMAL HIGH (ref 70–99)
Potassium: 3.9 mmol/L (ref 3.5–5.1)
Sodium: 141 mmol/L (ref 135–145)
Total Bilirubin: 0.4 mg/dL (ref 0.3–1.2)
Total Protein: 7.7 g/dL (ref 6.5–8.1)

## 2022-12-09 LAB — CBC WITH DIFFERENTIAL (CANCER CENTER ONLY)
Abs Immature Granulocytes: 0.04 10*3/uL (ref 0.00–0.07)
Basophils Absolute: 0 10*3/uL (ref 0.0–0.1)
Basophils Relative: 0 %
Eosinophils Absolute: 0 10*3/uL (ref 0.0–0.5)
Eosinophils Relative: 0 %
HCT: 43.4 % (ref 36.0–46.0)
Hemoglobin: 14.3 g/dL (ref 12.0–15.0)
Immature Granulocytes: 1 %
Lymphocytes Relative: 16 %
Lymphs Abs: 1.3 10*3/uL (ref 0.7–4.0)
MCH: 31.3 pg (ref 26.0–34.0)
MCHC: 32.9 g/dL (ref 30.0–36.0)
MCV: 95 fL (ref 80.0–100.0)
Monocytes Absolute: 0.4 10*3/uL (ref 0.1–1.0)
Monocytes Relative: 5 %
Neutro Abs: 6.6 10*3/uL (ref 1.7–7.7)
Neutrophils Relative %: 78 %
Platelet Count: 270 10*3/uL (ref 150–400)
RBC: 4.57 MIL/uL (ref 3.87–5.11)
RDW: 13.2 % (ref 11.5–15.5)
WBC Count: 8.4 10*3/uL (ref 4.0–10.5)
nRBC: 0 % (ref 0.0–0.2)

## 2022-12-09 NOTE — Assessment & Plan Note (Addendum)
-  diagnosed in 12/05/2022, ER-/PR-, g2 -I reviewed her recent image and biopsy results and discussed with patient in detail -DCIS will be cured by surgery alone, any adjuvant therapy is preventative.  We discussed the role of radiation.  Due to her ER negative and PR negative DCIS, there is no benefit of antiestrogen therapy after surgery. -If she decides to have mastectomy, then no need for adjuvant therapy -She is scheduled to see breast surgeon Dr. Donell Beers next week -We also discussed that biopsy has limitation, I will see her back if her surgical path shows invasive cancer.

## 2022-12-09 NOTE — Progress Notes (Addendum)
[facility]   [phone] [phone]   Clinic Follow up Note   Patient Care Team: Rodrigo Ran, MD as PCP - General (Internal Medicine) Pershing Proud, RN as Oncology Nurse Navigator Donnelly Angelica, RN as Oncology Nurse Navigator [name], MD as Consulting Physician (General Surgery) Malachy Mood, MD as Consulting Physician (Hematology) Dorothy Puffer, MD as Consulting Physician (Radiation Oncology) Felicity Coyer, MD as Consulting Physician (Endocrinology) Enid Baas, MD as Consulting Physician (Sports Medicine) Zelphia Cairo, MD as Consulting Physician (Obstetrics and Gynecology) Pollyann Samples, NP as Nurse Practitioner (Nurse Practitioner)  Date of Service:  12/09/2022  CHIEF COMPLAINT: Newly diagnosed left breast DCIS  CURRENT THERAPY:  Pending surgery  ASSESSMENT:  Samantha [name] is a 62 y.o. female with   Ductal carcinoma in situ (DCIS) of left breast -diagnosed in 12/05/2022, ER-/PR-, g2 -I reviewed her recent image and biopsy results and discussed with patient in detail -DCIS will be cured by surgery alone, any adjuvant therapy is preventative.  We discussed the role of radiation.  Due to her ER negative and PR negative DCIS, there is no benefit of antiestrogen therapy after surgery. -If she decides to have mastectomy, then no need for adjuvant therapy -She is scheduled to see breast surgeon Dr. Donell Beers next week -We also discussed that biopsy has limitation, I will see her back if her surgical path shows invasive cancer.  Malignant neoplasm of upper-outer quadrant of right breast in female, estrogen receptor negative (HCC) IDC and DCIS, Stage IA, pT1b, cN0, triple negative, Grade 3 -found on screening mammogram. S/p lumpectomy on 01/08/22 with Dr. Donell Beers, path showed: 8 mm invasive and in situ ductal carcinoma with negative margins. Repeat prognostic panel confirmed triple negative disease.  -lymph node biopsies 01/28/22 showed  negative nodes (0/2). Port placed at time of procedure. Postoperative course complicated by pneumothorax, which has resolved. -she began adjuvant TC on 02/20/22, and completed planned 4 cycles on 04/24/2022. -she completed adjuvant RT on 07/09/2022  -we discussed cancer surveillance and what to watch at home     PLAN: -I reviewed her recent left breast biopsy findings, and treatment plan -She is scheduled to see Dr. Donell Beers next week to discuss left breast surgery -I will see her back in 3 months for follow up    SUMMARY OF ONCOLOGIC HISTORY: Oncology History Overview Note   Cancer Staging  Malignant neoplasm of upper-outer quadrant of right breast in female, estrogen receptor negative Staging form: Breast, AJCC 8th Edition - Clinical stage from 12/23/2021: cT1b, cM0, G3, ER-, PR-, HER2- - Unsigned Stage prefix: Initial diagnosis Histologic grading system: 3 grade system - Pathologic stage from 01/08/2022: Stage IB (pT1b, pN0, cM0, G3, ER-, PR-, HER2-) - Signed by Malachy Mood, MD on 01/24/2022 Stage prefix: Initial diagnosis Histologic grading system: 3 grade system Residual tumor (R): R0 - None     Malignant neoplasm of upper-outer quadrant of right breast in female, estrogen receptor negative (HCC)  12/06/2021 Mammogram   CLINICAL DATA:  Patient returns today to evaluate RIGHT breast calcifications identified on a recent screening mammogram.   EXAM: DIGITAL DIAGNOSTIC UNILATERAL RIGHT MAMMOGRAM  IMPRESSION: Grouped coarse heterogeneous calcifications within the upper-outer quadrant of the RIGHT breast, measuring 6 mm extent. These may be fibroadenomatous calcifications. Stereotactic biopsy is recommended to exclude malignancy.   12/18/2021 Initial Biopsy   Diagnosis Breast, right, needle core biopsy, upper outer quadrant, x clip - DUCTAL CARCINOMA IN SITU, SOLID AND CRIBRIFORM TYPE WITH COMEDONECROSIS AND  ASSOCIATED CALCIFICATIONS, NUCLEAR GRADE 3 OF 3 - FOCAL MICROINVASION IS  PRESENT (LESS THAN 1 MM) WITH EVIDENCE OF LYMPHOVASCULAR INVASION - NECROSIS: PRESENT - CALCIFICATIONS: PRESENT - DCIS LENGTH: 7 MM IN GREATEST LINEAR DIMENSION ON FRAGMENTED CORES  PROGNOSTIC INDICATORS Results: IMMUNOHISTOCHEMICAL AND MORPHOMETRIC ANALYSIS PERFORMED MANUALLY The tumor cells are NEGATIVE for Her2 (1+). Estrogen Receptor: 0%, NEGATIVE Progesterone Receptor: 0%, NEGATIVE Proliferation Marker Ki67: 60%   12/23/2021 Initial Diagnosis   Malignant neoplasm of upper-outer quadrant of right breast in female, estrogen receptor negative (HCC)   12/23/2021 Imaging   EXAM: ULTRASOUND OF THE RIGHT AXILLA  IMPRESSION: No abnormal appearing RIGHT axillary lymph nodes.   01/08/2022 Cancer Staging   Staging form: Breast, AJCC 8th Edition - Pathologic stage from 01/08/2022: Stage IB (pT1b, pN0, cM0, G3, ER-, PR-, HER2-) - Signed by Malachy Mood, MD on 01/24/2022 Stage prefix: Initial diagnosis Histologic grading system: 3 grade system Residual tumor (R): R0 - None   02/20/2022 - 04/24/2022 Chemotherapy   Patient is on Treatment Plan : BREAST TC q21d     10/06/2022 Survivorship   SCP delivered by [name], NP      INTERVAL HISTORY:  Samantha [name] is here for her recent diagnosis of left breast DCIS. She was last seen by NP Lacie on October 06, 2022. She presents to the clinic accompanied by her husband.  She underwent left breast biopsy on December 04, 2022, which showed DCIS.  She is quite nervous about this.  This was discovered on recent diagnostic mammogram which showed a 3 mm group of calcifications in left breast.  She denies any new symptoms.  Her hot flashes has improved lately.    All other systems were reviewed with the patient and are negative.  MEDICAL HISTORY:  Past Medical History:  Diagnosis Date   Allergy    Anxiety 2015   Result of culmination of super stressful caregiving and deaths of 2 immediate family members.  Overall do pretty well.   Cancer [facility]) 2023    right breast   Depression    GERD (gastroesophageal reflux disease)    Hypothyroidism    Right carpal tunnel syndrome 09/19/2019   Thyroid disease     SURGICAL HISTORY: Past Surgical History:  Procedure Laterality Date   BREAST BIOPSY Left 12/04/2022   MM LT BREAST BX W LOC DEV 1ST LESION IMAGE BX SPEC STEREO GUIDE 12/04/2022 [facility] MAMMOGRAP[facility]   BREAST LUMPECTOMY WITH RADIOACTIVE SEED LOCALIZATION Right 01/08/2022   Procedure: RIGHT BREAST LUMPECTOMY WITH RADIOACTIVE SEED LOCALIZATION;  Surgeon: [name], MD;  Location: [facility] OR;  Service: General;  Laterality: Right;   PORT-A-CATH REMOVAL Left 05/20/2022   Procedure: REMOVAL PORT-A-CATH;  Surgeon: [name], MD;  Location: [facility];  Service: General;  Laterality: Left;   PORTACATH PLACEMENT Left 01/28/2022   Procedure: INSERTION PORT-A-CATH;  Surgeon: [name], MD;  Location: [facility];  Service: General;  Laterality: Left;   SENTINEL NODE BIOPSY Right 01/28/2022   Procedure: RIGHT SENTINEL LYMPH NODE BIOPSY;  Surgeon: [name], MD;  Location: [facility];  Service: General;  Laterality: Right;   TONSILLECTOMY      I have reviewed the social history and family history with the patient and they are unchanged from previous note.  ALLERGIES:  is allergic to nortriptyline, singulair [montelukast sodium], sulfonamide derivatives, amitriptyline hcl, esomeprazole, gabapentin, lexapro [escitalopram], and neosporin [bacitracin-polymyxin b].  MEDICATIONS:  Current Outpatient Medications  Medication Sig Dispense Refill   acetaminophen (  TYLENOL) 500 MG tablet Take 1,000 mg by mouth every 6 (six) hours as needed for moderate pain.     Calcium Carb-Cholecalciferol (CALCIUM-VITAMIN D3) 250-125 MG-UNIT TABS Take 1 tablet once a day     Cholecalciferol (VITAMIN D3) 1000 units CAPS Take 1,000 Units by mouth daily.     famotidine (PEPCID) 20 MG tablet Take 1 tablet (20 mg total) by mouth  daily.     hydrocortisone 1 % lotion Apply 1 Application topically 2 (two) times daily. 118 mL 0   LORazepam (ATIVAN) 0.5 MG tablet Take 0.25-0.5 mg by mouth 2 (two) times daily as needed for anxiety.     methocarbamol (ROBAXIN) 500 MG tablet Take 1 tablet (500 mg total) by mouth 4 (four) times daily as needed for muscle spasms. 60 tablet 0   sertraline (ZOLOFT) 50 MG tablet Take 50 mg by mouth daily.     TIROSINT 88 MCG CAPS Take 88 mcg by mouth See admin instructions. Take 88 mcg by mouth daily Monday-Saturday  10   No current facility-administered medications for this visit.    PHYSICAL EXAMINATION: ECOG PERFORMANCE STATUS: 0 - Asymptomatic  Vitals:   12/09/22 1216  BP: (!) 145/87  Pulse: 98  Resp: 16  Temp: 98.2 F (36.8 C)  SpO2: 100%   Wt Readings from Last 3 Encounters:  12/09/22 178 lb 9.6 oz (81 kg)  10/06/22 178 lb 14.4 oz (81.1 kg)  08/14/22 172 lb (78 kg)     GENERAL:alert, no distress and comfortable SKIN: skin color, texture, turgor are normal, no rashes or significant lesions EYES: normal, Conjunctiva are pink and non-injected, sclera clear NECK: supple, thyroid normal size, non-tender, without nodularity LYMPH:  no palpable lymphadenopathy in the cervical, axillary  LUNGS: clear to auscultation and percussion with normal breathing effort HEART: regular rate & rhythm and no murmurs and no lower extremity edema ABDOMEN:abdomen soft, non-tender and normal bowel sounds Musculoskeletal:no cyanosis of digits and no clubbing  NEURO: alert & oriented x 3 with fluent speech, no focal motor/sensory deficits Breasts: Breast inspection showed them to be symmetrical with no nipple discharge. Palpation of the breasts and axilla revealed no obvious mass that I could appreciate.   LABORATORY DATA:  I have reviewed the data as listed    Latest Ref Rng & Units 12/09/2022   12:02 PM 10/06/2022   11:04 AM 07/16/2022   11:24 AM  CBC  WBC 4.0 - 10.5 K/uL 8.4  5.2  4.7    Hemoglobin 12.0 - 15.0 g/dL 16.1  09.6  04.5   Hematocrit 36.0 - 46.0 % 43.4  41.6  43.4   Platelets 150 - 400 K/uL 270  246  239         Latest Ref Rng & Units 12/09/2022   12:02 PM 10/06/2022   11:04 AM 07/16/2022   11:24 AM  CMP  Glucose 70 - 99 mg/dL 409  811  914   BUN 8 - 23 mg/dL 14  15  14    Creatinine 0.44 - 1.00 mg/dL 7.82  9.56  2.13   Sodium 135 - 145 mmol/L 141  139  139   Potassium 3.5 - 5.1 mmol/L 3.9  3.9  3.9   Chloride 98 - 111 mmol/L 105  105  105   CO2 22 - 32 mmol/L 28  27  28    Calcium 8.9 - 10.3 mg/dL 9.9  9.5  9.6   Total Protein 6.5 - 8.1 g/dL 7.7  7.0  7.5  Total Bilirubin 0.3 - 1.2 mg/dL 0.4  0.5  0.4   Alkaline Phos 38 - 126 U/L 113  106  107   AST 15 - 41 U/L 21  22  20    ALT 0 - 44 U/L 23  23  18        RADIOGRAPHIC STUDIES: I have personally reviewed the radiological images as listed and agreed with the findings in the report. No results found.    No orders of the defined types were placed in this encounter.  All questions were answered. The patient knows to call the clinic with any problems, questions or concerns. No barriers to learning was detected. The total time spent in the appointment was 30 minutes, and more than 50% time on face-to-face counseling.Malachy Mood, MD 12/09/2022

## 2022-12-09 NOTE — Assessment & Plan Note (Signed)
IDC and DCIS, Stage IA, pT1b, cN0, triple negative, Grade 3 -found on screening mammogram. S/p lumpectomy on 01/08/22 with Dr. Donell Beers, path showed: 8 mm invasive and in situ ductal carcinoma with negative margins. Repeat prognostic panel confirmed triple negative disease.  -lymph node biopsies 01/28/22 showed negative nodes (0/2). Port placed at time of procedure. Postoperative course complicated by pneumothorax, which has resolved. -she began adjuvant TC on 02/20/22, and completed planned 4 cycles on 04/24/2022. -she completed adjuvant RT on 07/09/2022  -we discussed cancer surveillance and what to watch at home

## 2022-12-16 ENCOUNTER — Other Ambulatory Visit: Payer: Self-pay | Admitting: General Surgery

## 2022-12-16 DIAGNOSIS — C50112 Malignant neoplasm of central portion of left female breast: Secondary | ICD-10-CM | POA: Diagnosis not present

## 2022-12-16 DIAGNOSIS — Z171 Estrogen receptor negative status [ER-]: Secondary | ICD-10-CM | POA: Diagnosis not present

## 2022-12-16 DIAGNOSIS — C50411 Malignant neoplasm of upper-outer quadrant of right female breast: Secondary | ICD-10-CM | POA: Diagnosis not present

## 2022-12-17 ENCOUNTER — Other Ambulatory Visit: Payer: Self-pay | Admitting: General Surgery

## 2022-12-17 DIAGNOSIS — L301 Dyshidrosis [pompholyx]: Secondary | ICD-10-CM | POA: Diagnosis not present

## 2022-12-17 DIAGNOSIS — C50112 Malignant neoplasm of central portion of left female breast: Secondary | ICD-10-CM

## 2022-12-17 DIAGNOSIS — L218 Other seborrheic dermatitis: Secondary | ICD-10-CM | POA: Diagnosis not present

## 2022-12-18 ENCOUNTER — Encounter: Payer: Self-pay | Admitting: Hematology

## 2022-12-18 ENCOUNTER — Encounter: Payer: Self-pay | Admitting: *Deleted

## 2022-12-18 DIAGNOSIS — D0512 Intraductal carcinoma in situ of left breast: Secondary | ICD-10-CM

## 2022-12-25 NOTE — Progress Notes (Signed)
Surgical Instructions    Your procedure is scheduled on Thursday January 01, 2023.  Report to [facility] Main Entrance "A" at 9:00 A.M., then check in with the Admitting office.  Call this number if you have problems the morning of surgery:  [phone]   If you have any questions prior to your surgery date call [phone]: Open Monday-Friday 8am-4pm If you experience any cold or flu symptoms such as cough, fever, chills, shortness of breath, etc. between now and your scheduled surgery, please notify us at the above number     Remember:  Do not eat after midnight the night before your surgery.  You may drink clear liquids until 8:00 the morning of your surgery.   Clear liquids allowed are: Water, Non-Citrus Juices (without pulp), Carbonated Beverages, Clear Tea, Black Coffee ONLY (NO MILK, CREAM OR POWDERED CREAMER of any kind), and Gatorade.    Take these medicines the morning of surgery with A SIP OF WATER:  famotidine (PEPCID)  Fezolinetant (VEOZAH)  sertraline (ZOLOFT)  TIROSINT  Levothyroxine Sodium   If needed:  acetaminophen (TYLENOL)  methocarbamol (ROBAXIN)    As of today, STOP taking any Aspirin (unless otherwise instructed by your surgeon) Aleve, Naproxen, Ibuprofen, Motrin, Advil, Goody's, BC's, all herbal medications, fish oil, and all vitamins.  Special instructions:    Oral Hygiene is also important to reduce your risk of infection.  Remember - BRUSH YOUR TEETH THE MORNING OF SURGERY WITH YOUR REGULAR TOOTHPASTE   [place]- Preparing For Surgery  Before surgery, you can play an important role. Because skin is not sterile, your skin needs to be as free of germs as possible. You can reduce the number of germs on your skin by washing with CHG (chlorahexidine gluconate) Soap before surgery.  CHG is an antiseptic cleaner which kills germs and bonds with the skin to continue killing germs even after washing.     Please do not use if you have an allergy to  CHG or antibacterial soaps. If your skin becomes reddened/irritated stop using the CHG.  Do not shave (including legs and underarms) for at least 48 hours prior to first CHG shower. It is OK to shave your face.  Please follow these instructions carefully.     Shower the NIGHT BEFORE SURGERY and the MORNING OF SURGERY with CHG Soap.   If you chose to wash your hair, wash your hair first as usual with your normal shampoo. After you shampoo, rinse your hair and body thoroughly to remove the shampoo.  Then [employer] and genitals (private parts) with your normal soap and rinse thoroughly to remove soap.  After that Use CHG Soap as you would any other liquid soap. You can apply CHG directly to the skin and wash gently with a scrungie or a clean washcloth.   Apply the CHG Soap to your body ONLY FROM THE NECK DOWN.  Do not use on open wounds or open sores. Avoid contact with your eyes, ears, mouth and genitals (private parts). Wash Face and genitals (private parts)  with your normal soap.   Wash thoroughly, paying special attention to the area where your surgery will be performed.  Thoroughly rinse your body with warm water from the neck down.  DO NOT shower/wash with your normal soap after using and rinsing off the CHG Soap.  Pat yourself dry with a CLEAN TOWEL.  Wear CLEAN PAJAMAS to bed the night before surgery  Place CLEAN SHEETS on your bed the night before your  surgery  DO NOT SLEEP WITH PETS.   Day of Surgery:  Take a shower with CHG soap. Wear Clean/Comfortable clothing the morning of surgery Do not apply any deodorants/lotions.   Remember to brush your teeth WITH YOUR REGULAR TOOTHPASTE.  Do not wear jewelry or makeup. Do not wear lotions, powders, perfumes/cologne or deodorant. Do not shave 48 hours prior to surgery.  Men may shave face and neck. Do not bring valuables to the hospital. Do not wear nail polish, gel polish, artificial nails, or any other type of covering on  natural nails (fingers and toes) If you have artificial nails or gel coating that need to be removed by a nail salon, please have this removed prior to surgery. Artificial nails or gel coating may interfere with anesthesia's ability to adequately monitor your vital signs.  [facility] is not responsible for any belongings or valuables.    Do NOT Smoke (Tobacco/Vaping)  24 hours prior to your procedure  If you use a CPAP at night, you may bring your mask for your overnight stay.   Contacts, glasses, hearing aids, dentures or partials may not be worn into surgery, please bring cases for these belongings   For patients admitted to the hospital, discharge time will be determined by your treatment team.   Patients discharged the day of surgery will not be allowed to drive home, and someone needs to stay with them for 24 hours.   SURGICAL WAITING ROOM VISITATION Patients having surgery or a procedure may have no more than 2 support people in the waiting area - these visitors may rotate.   Children under the age of 23 must have an adult with them who is not the patient. If the patient needs to stay at the hospital during part of their recovery, the visitor guidelines for inpatient rooms apply. Pre-op nurse will coordinate an appropriate time for 1 support person to accompany patient in pre-op.  This support person may not rotate.   Please refer to [email or web address] for the visitor guidelines for Inpatients (after your surgery is over and you are in a regular room).   If you received a COVID test during your pre-op visit, it is requested that you wear a mask when out in public, stay away from anyone that may not be feeling well, and notify your surgeon if you develop symptoms. If you have been in contact with anyone that has tested positive in the last 10 days, please notify your surgeon.    Please read over the following fact sheets that you  were given.

## 2022-12-26 ENCOUNTER — Encounter (HOSPITAL_COMMUNITY)
Admission: RE | Admit: 2022-12-26 | Discharge: 2022-12-26 | Disposition: A | Discharge status: Home / Self Care | Payer: 59 | Source: Ambulatory Visit | Attending: General Surgery | Admitting: General Surgery

## 2022-12-26 ENCOUNTER — Encounter (HOSPITAL_COMMUNITY): Payer: Self-pay | Admitting: General Surgery

## 2022-12-26 ENCOUNTER — Other Ambulatory Visit: Payer: Self-pay

## 2022-12-26 DIAGNOSIS — Z01818 Encounter for other preprocedural examination: Secondary | ICD-10-CM | POA: Insufficient documentation

## 2022-12-26 NOTE — Progress Notes (Addendum)
PCP - Rodrigo Ran, MD Cardiologist - denies  PPM/ICD - denies Device Orders - n/a Rep Notified - n/a  Chest x-ray - 03/10/2013 EKG - 03/09/2013 Stress Test - denies ECHO - denies Cardiac Cath - denies  Sleep Study -denies  CPAP - n/a  Fasting Blood Sugar - no DM  Last dose of GLP1 agonist- n/a    Blood Thinner Instructions:n/a Aspirin Instructions:n/a  ERAS Protcol -until 0800 PRE-SURGERY- Ensure   COVID TEST- no   Anesthesia review: no  Patient denies shortness of breath, fever, cough and chest pain at PAT appointment and the last 2 months.    All instructions explained to the patient, with a verbal understanding of the material. Patient agrees to go over the instructions while at home for a better understanding. Patient also instructed to self quarantine after being tested for COVID-19. The opportunity to ask questions was provided.

## 2022-12-26 NOTE — Progress Notes (Signed)
urgical Instructions                 Your procedure is scheduled on Thursday January 01, 2023.             Report to [facility] Main Entrance "A" at 9:00 A.M., then check in with the Admitting office.             Call this number if you have problems the morning of surgery:             [phone]    If you have any questions prior to your surgery date call [phone]: Open Monday-Friday 8am-4pm If you experience any cold or flu symptoms such as cough, fever, chills, shortness of breath, etc. between now and your scheduled surgery, please notify us at the above number                  Remember:             Do not eat after midnight the night before your surgery.   You may drink clear liquids until 8:00 the morning of your surgery.   Clear liquids allowed are: Water, Non-Citrus Juices (without pulp), Carbonated Beverages, Clear Tea, Black Coffee ONLY (NO MILK, CREAM OR POWDERED CREAMER of any kind), and Gatorade.   Patient Instructions  The night before surgery:  No food after midnight. ONLY clear liquids after midnight  The day of surgery (if you do NOT have diabetes):  Drink ONE (1) Pre-Surgery Clear Ensure by 0800 the morning of surgery. Drink in one sitting. Do not sip.  This drink was given to you during your hospital  pre-op appointment visit.  Nothing else to drink after completing the  Pre-Surgery Clear Ensure.                           Take these medicines the morning of surgery with A SIP OF WATER:              famotidine (PEPCID)              Fezolinetant (VEOZAH)              sertraline (ZOLOFT)              TIROSINT              Levothyroxine Sodium    If needed:              acetaminophen (TYLENOL)              methocarbamol (ROBAXIN)      As of today, STOP taking any Aspirin (unless otherwise instructed by your surgeon) Aleve, Naproxen, Ibuprofen, Motrin, Advil, Goody's, BC's, all herbal medications, fish oil, and all vitamins.   Special instructions:      Oral Hygiene is also important to reduce your risk of infection.  Remember - BRUSH YOUR TEETH THE MORNING OF SURGERY WITH YOUR REGULAR TOOTHPASTE     [facility]- Preparing For Surgery   Before surgery, you can play an important role. Because skin is not sterile, your skin needs to be as free of germs as possible. You can reduce the number of germs on your skin by washing with CHG (chlorahexidine gluconate) Soap before surgery.  CHG is an antiseptic cleaner which kills germs and bonds with the skin to continue killing germs even after washing.       Please do not use if you  have an allergy to CHG or antibacterial soaps. If your skin becomes reddened/irritated stop using the CHG.  Do not shave (including legs and underarms) for at least 48 hours prior to first CHG shower. It is OK to shave your face.   Please follow these instructions carefully.                                                                                                                                  Shower the NIGHT BEFORE SURGERY and the MORNING OF SURGERY with CHG Soap.  If you chose to wash your hair, wash your hair first as usual with your normal shampoo. After you shampoo, rinse your hair and body thoroughly to remove the shampoo.  Then [employer] and genitals (private parts) with your normal soap and rinse thoroughly to remove soap.   After that Use CHG Soap as you would any other liquid soap. You can apply CHG directly to the skin and wash gently with a scrungie or a clean washcloth.    Apply the CHG Soap to your body ONLY FROM THE NECK DOWN.  Do not use on open wounds or open sores. Avoid contact with your eyes, ears, mouth and genitals (private parts). Wash Face and genitals (private parts)  with your normal soap.    Wash thoroughly, paying special attention to the area where your surgery will be performed.   Thoroughly rinse your body with warm water from the neck down.   DO NOT shower/wash with your  normal soap after using and rinsing off the CHG Soap.   Pat yourself dry with a CLEAN TOWEL.   Wear CLEAN PAJAMAS to bed the night before surgery   Place CLEAN SHEETS on your bed the night before your surgery   DO NOT SLEEP WITH PETS.     Day of Surgery:   Take a shower with CHG soap. Wear Clean/Comfortable clothing the morning of surgery Do not apply any deodorants/lotions.   Remember to brush your teeth WITH YOUR REGULAR TOOTHPASTE.   Do not wear jewelry or makeup. Do not wear lotions, powders, perfumes/cologne or deodorant. Do not shave 48 hours prior to surgery.  Men may shave face and neck. Do not bring valuables to the hospital. Do not wear nail polish, gel polish, artificial nails, or any other type of covering on natural nails (fingers and toes) If you have artificial nails or gel coating that need to be removed by a nail salon, please have this removed prior to surgery. Artificial nails or gel coating may interfere with anesthesia's ability to adequately monitor your vital signs.   [facility] is not responsible for any belongings or valuables.     Do NOT Smoke (Tobacco/Vaping)  24 hours prior to your procedure   If you use a CPAP at night, you may bring your mask for your overnight stay.   Contacts, glasses, hearing aids, dentures or partials may not  be worn into surgery, please bring cases for these belongings   For patients admitted to the hospital, discharge time will be determined by your treatment team.   Patients discharged the day of surgery will not be allowed to drive home, and someone needs to stay with them for 24 hours.     SURGICAL WAITING ROOM VISITATION Patients having surgery or a procedure may have no more than 2 support people in the waiting area - these visitors may rotate.   Children under the age of 14 must have an adult with them who is not the patient. If the patient needs to stay at the hospital during part of their recovery, the visitor  guidelines for inpatient rooms apply. Pre-op nurse will coordinate an appropriate time for 1 support person to accompany patient in pre-op.  This support person may not rotate.    Please refer to [email or web address] for the visitor guidelines for Inpatients (after your surgery is over and you are in a regular room).    If you received a COVID test during your pre-op visit, it is requested that you wear a mask when out in public, stay away from anyone that may not be feeling well, and notify your surgeon if you develop symptoms. If you have been in contact with anyone that has tested positive in the last 10 days, please notify your surgeon.     Please read over the following fact sheets that you were given.

## 2022-12-30 ENCOUNTER — Ambulatory Visit
Admission: RE | Admit: 2022-12-30 | Discharge: 2022-12-30 | Disposition: A | Discharge status: Home / Self Care | Payer: 59 | Source: Ambulatory Visit | Attending: General Surgery | Admitting: General Surgery

## 2022-12-30 DIAGNOSIS — D0512 Intraductal carcinoma in situ of left breast: Secondary | ICD-10-CM | POA: Diagnosis not present

## 2022-12-30 DIAGNOSIS — C50112 Malignant neoplasm of central portion of left female breast: Secondary | ICD-10-CM

## 2022-12-30 HISTORY — PX: BREAST BIOPSY: SHX20

## 2023-01-01 ENCOUNTER — Encounter (HOSPITAL_COMMUNITY)
Admission: RE | Disposition: A | Discharge status: Home / Self Care | Payer: Self-pay | Source: Home / Self Care | Attending: General Surgery

## 2023-01-01 ENCOUNTER — Other Ambulatory Visit: Payer: Self-pay

## 2023-01-01 ENCOUNTER — Ambulatory Visit (HOSPITAL_COMMUNITY)
Admission: RE | Admit: 2023-01-01 | Discharge: 2023-01-01 | Disposition: A | Discharge status: Home / Self Care | Payer: 59 | Attending: General Surgery | Admitting: General Surgery

## 2023-01-01 ENCOUNTER — Ambulatory Visit (HOSPITAL_BASED_OUTPATIENT_CLINIC_OR_DEPARTMENT_OTHER): Payer: 59 | Admitting: Anesthesiology

## 2023-01-01 ENCOUNTER — Encounter (HOSPITAL_COMMUNITY): Payer: Self-pay | Admitting: General Surgery

## 2023-01-01 ENCOUNTER — Ambulatory Visit (HOSPITAL_COMMUNITY): Payer: 59 | Admitting: Anesthesiology

## 2023-01-01 ENCOUNTER — Ambulatory Visit
Admission: RE | Admit: 2023-01-01 | Discharge: 2023-01-01 | Disposition: A | Discharge status: Home / Self Care | Payer: 59 | Source: Ambulatory Visit | Attending: General Surgery | Admitting: General Surgery

## 2023-01-01 DIAGNOSIS — Z79899 Other long term (current) drug therapy: Secondary | ICD-10-CM | POA: Insufficient documentation

## 2023-01-01 DIAGNOSIS — C50112 Malignant neoplasm of central portion of left female breast: Secondary | ICD-10-CM | POA: Diagnosis not present

## 2023-01-01 DIAGNOSIS — C50412 Malignant neoplasm of upper-outer quadrant of left female breast: Secondary | ICD-10-CM | POA: Diagnosis not present

## 2023-01-01 DIAGNOSIS — K219 Gastro-esophageal reflux disease without esophagitis: Secondary | ICD-10-CM | POA: Insufficient documentation

## 2023-01-01 DIAGNOSIS — Z923 Personal history of irradiation: Secondary | ICD-10-CM | POA: Insufficient documentation

## 2023-01-01 DIAGNOSIS — Z171 Estrogen receptor negative status [ER-]: Secondary | ICD-10-CM | POA: Insufficient documentation

## 2023-01-01 DIAGNOSIS — D0512 Intraductal carcinoma in situ of left breast: Secondary | ICD-10-CM | POA: Diagnosis not present

## 2023-01-01 DIAGNOSIS — E039 Hypothyroidism, unspecified: Secondary | ICD-10-CM | POA: Insufficient documentation

## 2023-01-01 DIAGNOSIS — C50912 Malignant neoplasm of unspecified site of left female breast: Secondary | ICD-10-CM | POA: Diagnosis not present

## 2023-01-01 HISTORY — PX: BREAST LUMPECTOMY WITH RADIOACTIVE SEED LOCALIZATION: SHX6424

## 2023-01-01 SURGERY — BREAST LUMPECTOMY WITH RADIOACTIVE SEED LOCALIZATION
Anesthesia: General | Site: Breast | Laterality: Left

## 2023-01-01 MED ORDER — ONDANSETRON HCL 4 MG/2ML IJ SOLN
INTRAMUSCULAR | Status: DC | PRN
  Administered 2023-01-01: 4 mg via INTRAVENOUS

## 2023-01-01 MED ORDER — MIDAZOLAM HCL 2 MG/2ML IJ SOLN
INTRAMUSCULAR | Status: AC
  Filled 2023-01-01: qty 2

## 2023-01-01 MED ORDER — CHLORHEXIDINE GLUCONATE 0.12 % MT SOLN: 15.0000 mL | Freq: Once | OROMUCOSAL | Status: AC

## 2023-01-01 MED ORDER — ACETAMINOPHEN 500 MG PO TABS
1000.0000 mg | ORAL_TABLET | ORAL | Status: AC
  Administered 2023-01-01: 1000 mg via ORAL
  Filled 2023-01-01: qty 2

## 2023-01-01 MED ORDER — FENTANYL CITRATE (PF) 250 MCG/5ML IJ SOLN
INTRAMUSCULAR | Status: AC
  Filled 2023-01-01: qty 5

## 2023-01-01 MED ORDER — LIDOCAINE HCL 1 % IJ SOLN
INTRAMUSCULAR | Status: AC
  Filled 2023-01-01: qty 20

## 2023-01-01 MED ORDER — CHLORHEXIDINE GLUCONATE CLOTH 2 % EX PADS: 6.0000 | MEDICATED_PAD | Freq: Once | CUTANEOUS | Status: DC

## 2023-01-01 MED ORDER — LIDOCAINE HCL 1 % IJ SOLN
INTRAMUSCULAR | Status: DC | PRN
  Administered 2023-01-01: 40 mL via INTRAMUSCULAR

## 2023-01-01 MED ORDER — LACTATED RINGERS IV SOLN: INTRAVENOUS | Status: DC

## 2023-01-01 MED ORDER — MIDAZOLAM HCL 2 MG/2ML IJ SOLN
INTRAMUSCULAR | Status: DC | PRN
  Administered 2023-01-01 (×2): 1 mg via INTRAVENOUS

## 2023-01-01 MED ORDER — CEFAZOLIN SODIUM-DEXTROSE 2-4 GM/100ML-% IV SOLN
2.0000 g | INTRAVENOUS | Status: AC
  Administered 2023-01-01: 2 g via INTRAVENOUS
  Filled 2023-01-01: qty 100

## 2023-01-01 MED ORDER — CHLORHEXIDINE GLUCONATE 0.12 % MT SOLN
OROMUCOSAL | Status: AC
  Administered 2023-01-01: 15 mL via OROMUCOSAL
  Filled 2023-01-01: qty 15

## 2023-01-01 MED ORDER — DEXAMETHASONE SODIUM PHOSPHATE 10 MG/ML IJ SOLN
INTRAMUSCULAR | Status: AC
  Filled 2023-01-01: qty 1

## 2023-01-01 MED ORDER — PROPOFOL 10 MG/ML IV BOLUS
INTRAVENOUS | Status: DC | PRN
Start: 2023-01-01 — End: 2023-01-01
  Administered 2023-01-01: 150 mg via INTRAVENOUS

## 2023-01-01 MED ORDER — ONDANSETRON HCL 4 MG/2ML IJ SOLN
INTRAMUSCULAR | Status: AC
  Filled 2023-01-01: qty 2

## 2023-01-01 MED ORDER — 0.9 % SODIUM CHLORIDE (POUR BTL) OPTIME
TOPICAL | Status: DC | PRN
  Administered 2023-01-01: 1000 mL

## 2023-01-01 MED ORDER — ORAL CARE MOUTH RINSE: 15.0000 mL | Freq: Once | OROMUCOSAL | Status: AC

## 2023-01-01 MED ORDER — LIDOCAINE 2% (20 MG/ML) 5 ML SYRINGE
INTRAMUSCULAR | Status: DC | PRN
  Administered 2023-01-01: 80 mg via INTRAVENOUS

## 2023-01-01 MED ORDER — PROPOFOL 10 MG/ML IV BOLUS
INTRAVENOUS | Status: AC
  Filled 2023-01-01: qty 20

## 2023-01-01 MED ORDER — LIDOCAINE 2% (20 MG/ML) 5 ML SYRINGE
INTRAMUSCULAR | Status: AC
  Filled 2023-01-01: qty 5

## 2023-01-01 MED ORDER — DEXAMETHASONE SODIUM PHOSPHATE 10 MG/ML IJ SOLN
INTRAMUSCULAR | Status: DC | PRN
Start: 2023-01-01 — End: 2023-01-01
  Administered 2023-01-01: 5 mg via INTRAVENOUS

## 2023-01-01 MED ORDER — FENTANYL CITRATE (PF) 250 MCG/5ML IJ SOLN
INTRAMUSCULAR | Status: DC | PRN
  Administered 2023-01-01 (×4): 25 ug via INTRAVENOUS

## 2023-01-01 MED ORDER — FENTANYL CITRATE (PF) 100 MCG/2ML IJ SOLN: 25.0000 ug | INTRAMUSCULAR | Status: DC | PRN

## 2023-01-01 MED ORDER — HYDROCODONE-ACETAMINOPHEN 5-325 MG PO TABS: 1.0000 | ORAL_TABLET | Freq: Four times a day (QID) | ORAL | 0 refills | Status: DC | PRN

## 2023-01-01 MED ORDER — BUPIVACAINE-EPINEPHRINE (PF) 0.25% -1:200000 IJ SOLN
INTRAMUSCULAR | Status: AC
  Filled 2023-01-01: qty 30

## 2023-01-01 SURGICAL SUPPLY — 42 items
ADH SKN CLS APL DERMABOND .7 (GAUZE/BANDAGES/DRESSINGS) ×1
APL PRP STRL LF DISP 70% ISPRP (MISCELLANEOUS) ×1
BAG COUNTER SPONGE SURGICOUNT (BAG) ×2 IMPLANT
BAG SPNG CNTER NS LX DISP (BAG) ×1
BINDER BREAST LRG (GAUZE/BANDAGES/DRESSINGS) IMPLANT
BINDER BREAST XLRG (GAUZE/BANDAGES/DRESSINGS) IMPLANT
CANISTER SUCT 3000ML PPV (MISCELLANEOUS) IMPLANT
CHLORAPREP W/TINT 26 (MISCELLANEOUS) ×2 IMPLANT
CLIP TI LARGE 6 (CLIP) ×2 IMPLANT
COVER PROBE W GEL 5X96 (DRAPES) ×2 IMPLANT
COVER SURGICAL LIGHT HANDLE (MISCELLANEOUS) ×2 IMPLANT
DERMABOND ADVANCED .7 DNX12 (GAUZE/BANDAGES/DRESSINGS) ×2 IMPLANT
DEVICE DUBIN SPECIMEN MAMMOGRA (MISCELLANEOUS) ×2 IMPLANT
DRAPE CHEST BREAST 15X10 FENES (DRAPES) ×2 IMPLANT
ELECT COATED BLADE 2.86 ST (ELECTRODE) ×2 IMPLANT
ELECT REM PT RETURN 9FT ADLT (ELECTROSURGICAL) ×1
ELECTRODE REM PT RTRN 9FT ADLT (ELECTROSURGICAL) ×2 IMPLANT
GAUZE PAD ABD 8X10 STRL (GAUZE/BANDAGES/DRESSINGS) ×2 IMPLANT
GAUZE SPONGE 4X4 12PLY STRL LF (GAUZE/BANDAGES/DRESSINGS) ×2 IMPLANT
GLOVE BIO SURGEON STRL SZ 6 (GLOVE) ×2 IMPLANT
GLOVE INDICATOR 6.5 STRL GRN (GLOVE) ×2 IMPLANT
GOWN STRL REUS W/ TWL LRG LVL3 (GOWN DISPOSABLE) ×2 IMPLANT
GOWN STRL REUS W/ TWL XL LVL3 (GOWN DISPOSABLE) ×2 IMPLANT
GOWN STRL REUS W/TWL LRG LVL3 (GOWN DISPOSABLE) ×1
GOWN STRL REUS W/TWL XL LVL3 (GOWN DISPOSABLE) ×1
KIT BASIN OR (CUSTOM PROCEDURE TRAY) ×2 IMPLANT
KIT MARKER MARGIN INK (KITS) ×2 IMPLANT
LIGHT WAVEGUIDE WIDE FLAT (MISCELLANEOUS) IMPLANT
NDL HYPO 25GX1X1/2 BEV (NEEDLE) ×2 IMPLANT
NEEDLE HYPO 25GX1X1/2 BEV (NEEDLE) ×1
NS IRRIG 1000ML POUR BTL (IV SOLUTION) IMPLANT
PACK GENERAL/GYN (CUSTOM PROCEDURE TRAY) ×2 IMPLANT
STRIP CLOSURE SKIN 1/2X4 (GAUZE/BANDAGES/DRESSINGS) ×2 IMPLANT
SUT MNCRL AB 4-0 PS2 18 (SUTURE) ×2 IMPLANT
SUT SILK 2 0 SH (SUTURE) IMPLANT
SUT VIC AB 2-0 SH 27 (SUTURE) ×1
SUT VIC AB 2-0 SH 27XBRD (SUTURE) IMPLANT
SUT VIC AB 3-0 SH 27 (SUTURE) ×1
SUT VIC AB 3-0 SH 27X BRD (SUTURE) ×2 IMPLANT
SYR CONTROL 10ML LL (SYRINGE) ×2 IMPLANT
TOWEL GREEN STERILE (TOWEL DISPOSABLE) ×2 IMPLANT
TOWEL GREEN STERILE FF (TOWEL DISPOSABLE) ×2 IMPLANT

## 2023-01-01 NOTE — H&P (Signed)
PROVIDER: Matthias Hughs, MD Patient Care Team: Ezequiel Kayser, MD as PCP - General (Internal Medicine) Malachy Mood, MD (Hematology and Oncology) Jonna Coup, MD (Radiation Oncology) Ulla Potash, MD (Endocrinology) Matthias Hughs, MD as Consulting Provider (Surgical Oncology) Lajuana Matte, MD (Obstetrics and Gynecology)  MRN: [id number] DOB: November 24, 1960 DATE OF ENCOUNTER: 12/16/2022  Chief Complaint: Left Breast Cancer   History of Present Illness: Samantha [name] is a 62 y.o. female who is seen today for breast cancer follow up.  Initial history:   Pt presented with a new diagnosis of RIGHT breast cancer 12/2021. She had screening detected calcifications. Diagnostic imaging confirmed this. It showed 6 mm of calcs in the UOQ. Core needle biopsy was performed. 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 This was ER/PR negative and enough was present for her 2 testing which was negative. Ki 67 was 60%.   Pt had no h/o cancer before this. She has no definitive family cancer history, but a maternal great uncle may have had brain cancer.  She helps care for 3 of her grandkids below 48 years old 3 days per week.   She had a right seed loc lumpectomy 01/08/2022. Unfortunately she had 8 mm of invasive cancer that was triple negative. She underwent SLN bx and port 01/28/2022. She got a moderate PTX on the left and required chest tube for around 5-6 days as her lung kept dropping.   She had port removal 05/20/2022. She had minimal pain. She completed XRT and got chemo.   Interval history:   She had had diagnostic mammography at her normal time and was found to have 3 mm of calcifications on the left. This led to core needle biopsy which showed hormone negative intermediate grade DCIS. So now a new diagnosis of LEFT breast cancer August 2024.   Review of Systems: A complete review of systems was obtained from the patient. I have reviewed this information and  discussed as appropriate with the patient. See HPI as well for other ROS.  Review of Systems  All other systems reviewed and are negative.   Medical History: Past Medical History:  Diagnosis Date  Anxiety  Arthritis  GERD (gastroesophageal reflux disease)  Thyroid disease   Patient Active Problem List  Diagnosis  Malignant neoplasm of upper-outer quadrant of right breast in female, estrogen receptor negative (CMS/HHS-HCC)  Carpal tunnel syndrome  Cavus deformity of foot, acquired  Degenerative arthritis of lumbar spine  Degenerative disc disease, cervical  Intraductal carcinoma in situ of breast  Osteoarthritis, hand  Primary hypothyroidism  Sensorineural hearing loss (SNHL) of right ear with unrestricted hearing of left ear  Cervical radiculopathy at C8  Heartburn  Nervousness  Anxiousness  Malignant neoplasm of central portion of left breast in female, estrogen receptor negative (CMS/HHS-HCC)   Past Surgical History:  Procedure Laterality Date  TONSILLECTOMY 1981  RIGHT BREAST LUMPECTOMY WITH RADIOACTIVE SEED LOCALIZATION 01/08/2022  Dr. Donell Beers  RIGHT SENTINEL LYMPH NODE BIOPSY (Right: Axilla) INSERTION PORT-A-CATH 01/28/2022  Dr. Donell Beers    Allergies  Allergen Reactions  Montelukast Sodium Other (See Comments)  Depressed mood  Nortriptyline Unknown  depression  Omeprazole Hives  Sulfa (Sulfonamide Antibiotics) Nausea And Vomiting  Mold Other (See Comments)  Neomycin Other (See Comments)  Other Other (See Comments)  Pseudoephedrine Hcl Other (See Comments)  Esomeprazole Other (See Comments) and Rash  Gabapentin Nausea   Current Outpatient Medications on File Prior to Visit  Medication Sig Dispense Refill  calcium carbonate-vitamin D3 250 mg-3.125 mcg (125 unit) tablet Take 1 tablet by mouth once daily  cholecalciferol (VITAMIN D3) 2,000 unit capsule Take 2,000 Units by mouth once daily  levothyroxine (TIROSINT) 100 mcg Cap Take 100 mcg by mouth once a week  Pt takes this medication once a week (Patient not taking: Reported on 02/17/2022)  levothyroxine 88 mcg Cap Take 88 mcg by mouth once daily Pt takes this medication 6 days a week  sertraline (ZOLOFT) 25 MG tablet Take 25 mg by mouth once daily (Patient not taking: Reported on 02/11/2022)  sertraline (ZOLOFT) 50 MG tablet Take 50 mg by mouth once daily   No current facility-administered medications on file prior to visit.   Family History  Problem Relation Age of Onset  Hyperlipidemia (Elevated cholesterol) Father    Social History   Tobacco Use  Smoking Status Never  Smokeless Tobacco Never    Social History   Socioeconomic History  Marital status: Married  Tobacco Use  Smoking status: Never  Smokeless tobacco: Never  Vaping Use  Vaping status: Never Used  Substance and Sexual Activity  Alcohol use: Yes  Comment: very rare, less than once a month  Drug use: Never  Sexual activity: Defer   Social Determinants of Health   Food Insecurity: Low Risk (11/25/2022)  Received from [facility]  Food vital sign  Within the past 12 months, you worried that your food would run out before you got money to buy more: Never true  Within the past 12 months, the food you bought just didn't last and you didn't have money to get more. : Never true  Received from [employer]  Social Network  Housing Stability: Low Risk (11/25/2022)  Received from [facility]  Living Situation  What is your living situation today?: I have a steady place to live  Think about the place you live. Do you have problems with any of the following? Choose all that apply:: None/None on this list   Objective:   There were no vitals filed for this visit.  There is no height or weight on file to calculate BMI.  Head: Normocephalic and atraumatic.  Eyes: Conjunctivae are normal. Pupils are equal, round, and reactive to light. No scleral icterus.  Neck: Normal range of motion. Neck supple. No  tracheal deviation present. No thyromegaly present.  Resp: No respiratory distress, normal effort. Breast: right breast smaller than left due to XRT. Left breast without nipple retraction or nipple discharge. No LAD. No palpable masses. Right breast benign.  Abd: Abdomen is soft, non distended and non tender. No masses are palpable. There is no rebound and no guarding.  Neurological: Alert and oriented to person, place, and time. Coordination normal.  Skin: Skin is warm and dry. No rash noted. No diaphoretic. No erythema. No pallor.  Psychiatric: Normal mood and affect. Normal behavior. Judgment and thought content normal.   Labs, Imaging and Diagnostic Testing:  Breast, left, needle core biopsy, ribbon clip DUCTAL CARCINOMA IN SITU, COMEDO AND CRIBRIFORM, INTERMEDIATE NUCLEAR GRADE NECROSIS: PRESENT CALCIFICATIONS: PRESENT DCIS LENGTH: 0.5 CM Estrogen Receptor: 0%, NEGATIVE Progesterone Receptor: 0%, NEGATIVE  Assessment and Plan:   Diagnoses and all orders for this visit:  Malignant neoplasm of upper-outer quadrant of right breast in female, estrogen receptor negative (CMS/HHS-HCC)  Malignant neoplasm of central portion of left breast in female, estrogen receptor negative (CMS/HHS-HCC)  Pt has new diagnosis of cTis left breast cancer. We will plan seed localized lumpectomy. This will  be followed by XRT and no antihormonal tx as it is hormone negative.   Last time, she had invasive cancer found at final lumpectomy path, so hopefully that won't happen again.   I re-discussed surgery including risks with the patient and her husband. She would like to do this as soon as possible.  I reviewed risks of bleeding, infection, seroma, change in breast shape or size, possible need for additional surgery. I also discussed risk of heart or lung complications and risk of finding invasive cancer.  Patient would like wide margins taken to minimize her risk of additional surgery.

## 2023-01-01 NOTE — Transfer of Care (Signed)
Immediate Anesthesia Transfer of Care Note  Patient: Samantha [name]  Procedure(s) Performed: LEFT BREAST SEED LOCALIZED LUMPECTOMY (Left: Breast)  Patient Location: PACU  Anesthesia Type:General  Level of Consciousness: drowsy and patient cooperative  Airway & Oxygen Therapy: Patient Spontanous Breathing and Patient connected to face mask oxygen  Post-op Assessment: Report given to RN and Post -op Vital signs reviewed and stable  Post vital signs: Reviewed and stable  Last Vitals:  Vitals Value Taken Time  BP 108/72 01/01/23 1320  Temp    Pulse 94 01/01/23 1326  Resp 18 01/01/23 1326  SpO2 99 % 01/01/23 1326  Vitals shown include unfiled device data.  Last Pain:  Vitals:   01/01/23 0941  TempSrc:   PainSc: 3       Patients Stated Pain Goal: 0 (01/01/23 0941)  Complications: No notable events documented.

## 2023-01-01 NOTE — Anesthesia Procedure Notes (Signed)
Procedure Name: LMA Insertion Date/Time: 01/01/2023 12:02 PM  Performed by: Cy Blamer, CRNAPre-anesthesia Checklist: Patient identified, Emergency Drugs available, Suction available, Patient being monitored and Timeout performed Patient Re-evaluated:Patient Re-evaluated prior to induction Oxygen Delivery Method: Circle system utilized Preoxygenation: Pre-oxygenation with 100% oxygen Induction Type: IV induction LMA: LMA inserted LMA Size: 4.0 Number of attempts: 1 Placement Confirmation: positive ETCO2 and breath sounds checked- equal and bilateral Tube secured with: Tape Dental Injury: Teeth and Oropharynx as per pre-operative assessment

## 2023-01-01 NOTE — Anesthesia Preprocedure Evaluation (Addendum)
Anesthesia Evaluation  Patient identified by MRN, date of birth, ID band Patient awake    Reviewed: Allergy & Precautions, NPO status , Patient's Chart, lab work & pertinent test results  Airway Mallampati: II  TM Distance: >3 FB Neck ROM: Full    Dental no notable dental hx. (+) Teeth Intact, Dental Advisory Given   Pulmonary neg pulmonary ROS   Pulmonary exam normal breath sounds clear to auscultation       Cardiovascular negative cardio ROS Normal cardiovascular exam Rhythm:Regular Rate:Normal     Neuro/Psych  PSYCHIATRIC DISORDERS Anxiety Depression    negative neurological ROS     GI/Hepatic Neg liver ROS,GERD  Controlled and Medicated,,  Endo/Other  Hypothyroidism    Renal/GU negative Renal ROS  negative genitourinary   Musculoskeletal  (+) Arthritis ,    Abdominal   Peds  Hematology negative hematology ROS (+)   Anesthesia Other Findings   Reproductive/Obstetrics                             Anesthesia Physical Anesthesia Plan  ASA: 2  Anesthesia Plan: General   Post-op Pain Management: Tylenol PO (pre-op)*   Induction: Intravenous  PONV Risk Score and Plan: 3 and Ondansetron, Dexamethasone and Midazolam  Airway Management Planned: LMA  Additional Equipment:   Intra-op Plan:   Post-operative Plan: Extubation in OR  Informed Consent: I have reviewed the patients History and Physical, chart, labs and discussed the procedure including the risks, benefits and alternatives for the proposed anesthesia with the patient or authorized representative who has indicated his/her understanding and acceptance.     Dental advisory given  Plan Discussed with: CRNA  Anesthesia Plan Comments:        Anesthesia Quick Evaluation

## 2023-01-01 NOTE — Interval H&P Note (Signed)
History and Physical Interval Note:  01/01/2023 11:03 AM  Samantha [name]  has presented today for surgery, with the diagnosis of LEFT BREAST CANCER.  The various methods of treatment have been discussed with the patient and family. After consideration of risks, benefits and other options for treatment, the patient has consented to  Procedure(s): LEFT BREAST SEED LOCALIZED LUMPECTOMY (Left) as a surgical intervention.  The patient's history has been reviewed, patient examined, no change in status, stable for surgery.  I have reviewed the patient's chart and labs.  Questions were answered to the patient's satisfaction.     [name]

## 2023-01-01 NOTE — Discharge Instructions (Addendum)
[name] Office Phone Number [phone]  BREAST BIOPSY/ PARTIAL MASTECTOMY: POST OP INSTRUCTIONS  Always review your discharge instruction sheet given to you by the facility where your surgery was performed.  IF YOU HAVE DISABILITY OR FAMILY LEAVE FORMS, YOU MUST BRING THEM TO THE OFFICE FOR PROCESSING.  DO NOT GIVE THEM TO YOUR DOCTOR.  Take 2 tylenol (acetominophen) three times a day for 3 days.  If you still have pain, add ibuprofen with food in between if able to take this (if you have kidney issues or stomach issues, do not take ibuprofen).  If both of those are not enough, add the narcotic pain pill.  If you find you are needing a lot of this overnight after surgery, call the next morning for a refill.    Prescriptions will not be filled after 5pm or on week-ends. Take your usually prescribed medications unless otherwise directed You should eat very light the first 24 hours after surgery, such as soup, crackers, pudding, etc.  Resume your normal diet the day after surgery. Most patients will experience some swelling and bruising in the breast.  Ice packs and a good support bra will help.  Swelling and bruising can take several days to resolve.  It is common to experience some constipation if taking pain medication after surgery.  Increasing fluid intake and taking a stool softener will usually help or prevent this problem from occurring.  A mild laxative (Milk of Magnesia or Miralax) should be taken according to package directions if there are no bowel movements after 48 hours. Unless discharge instructions indicate otherwise, you may remove your bandages 48 hours after surgery, and you may shower at that time.  You may have steri-strips (small skin tapes) in place directly over the incision.  These strips should be left on the skin at least for for 7-10 days.    ACTIVITIES:  You may resume regular daily activities (gradually increasing) beginning the next day.  Wearing a  good support bra or sports bra (or the breast binder) minimizes pain and swelling.  You may have sexual intercourse when it is comfortable. No heavy lifting for 1-2 weeks (not over around 10 pounds).  You may drive when you no longer are taking prescription pain medication, you can comfortably wear a seatbelt, and you can safely maneuver your car and apply brakes. RETURN TO WORK:  __________3-14 days depending on job. _______________ Samantha [name] should see your doctor in the office for a follow-up appointment approximately two weeks after your surgery.  Your doctor's nurse will typically make your follow-up appointment when she calls you with your pathology report.  Expect your pathology report 3-4 business days after your surgery.  You may call to check if you do not hear from [place] after three days.   WHEN TO CALL YOUR DOCTOR: Fever over 101.0 Nausea and/or vomiting. Extreme swelling or bruising. Continued bleeding from incision. Increased pain, redness, or drainage from the incision.  The clinic staff is available to answer your questions during regular business hours.  Please don't hesitate to call and ask to speak to one of the nurses for clinical concerns.  If you have a medical emergency, go to the nearest emergency room or call 911.  A surgeon from [facility] is always on call at the hospital.  For further questions, please visit [email or web address]

## 2023-01-01 NOTE — Op Note (Signed)
Left Breast Radioactive seed localized lumpectomy  Indications: This patient presents with history of left breast cancer, central quadrant, intermediate grade DCIS, Er/PR negative  Pre-operative Diagnosis: left breast cancer  Post-operative Diagnosis: Same  Surgeon: [name]   Anesthesia: General endotracheal anesthesia  ASA Class: 2  Procedure Details  The patient was seen in the Holding Room. The risks, benefits, complications, treatment options, and expected outcomes were discussed with the patient. The possibilities of bleeding, infection, the need for additional procedures, failure to diagnose a condition, and creating a complication requiring other procedures or operations were discussed with the patient. The patient concurred with the proposed plan, giving informed consent.  The site of surgery properly noted/marked. The patient was taken to Operating Room # 2, identified, and the procedure verified as left breast seed localized lumpectomy.  The left breast and chest were prepped and draped in standard fashion. A superior circumareolar incision was made near the previously placed radioactive seed.  Dissection was carried down around the point of maximum signal intensity. The cautery was used to perform the dissection.   The specimen was inked with the margin marker paint kit.    Specimen radiography confirmed inclusion of the mammographic lesion, the clip, and the seed.  The background signal in the breast was zero.  Additional margins were taken in all cardinal directions.    Hemostasis was achieved with cautery.  The cavity was marked with clips on each border other than the anterior border.  The wound was irrigated.  Deep tissues were pulled together with 2-0 vicryl sutures to minimize the defect.  The skin was closed with 3-0 vicryl interrupted deep dermal sutures and 4-0 monocryl running subcuticular suture.      Sterile dressings were applied. At the end of the operation, all  sponge, instrument, and needle counts were correct.   Findings: Seed, clip in specimen.  posterior margin is pectoralis   Estimated Blood Loss:  min         Specimens: left breast tissue with seed, additional inferior margin, additional medial margin, additional superior margin, additional lateral margin, additional inferior margin, additional posterior margin.          Complications:  None; patient tolerated the procedure well.         Disposition: PACU - hemodynamically stable.         Condition: stable

## 2023-01-02 ENCOUNTER — Encounter (HOSPITAL_COMMUNITY): Payer: Self-pay | Admitting: General Surgery

## 2023-01-02 LAB — SURGICAL PATHOLOGY

## 2023-01-02 NOTE — Anesthesia Postprocedure Evaluation (Signed)
Anesthesia Post Note  Patient: Samantha [name]  Procedure(s) Performed: LEFT BREAST SEED LOCALIZED LUMPECTOMY (Left: Breast)     Patient location during evaluation: PACU Anesthesia Type: General Level of consciousness: awake and alert Pain management: pain level controlled Vital Signs Assessment: post-procedure vital signs reviewed and stable Respiratory status: spontaneous breathing, nonlabored ventilation, respiratory function stable and patient connected to nasal cannula oxygen Cardiovascular status: blood pressure returned to baseline and stable Postop Assessment: no apparent nausea or vomiting Anesthetic complications: no  No notable events documented.  Last Vitals:  Vitals:   01/01/23 1330 01/01/23 1345  BP: (!) 141/73 (!) 142/75  Pulse: 91 95  Resp: 16 16  Temp:  (!) 36.4 C  SpO2: 97% 98%    Last Pain:  Vitals:   01/01/23 1345  TempSrc:   PainSc: 0-No pain                 Hina Gupta L Hailey Stormer

## 2023-01-07 ENCOUNTER — Encounter: Payer: Self-pay | Admitting: *Deleted

## 2023-01-08 ENCOUNTER — Ambulatory Visit: Payer: 59 | Admitting: Dietician

## 2023-01-15 ENCOUNTER — Ambulatory Visit: Payer: 59 | Admitting: Hematology

## 2023-01-15 ENCOUNTER — Other Ambulatory Visit: Payer: 59

## 2023-01-19 DIAGNOSIS — E039 Hypothyroidism, unspecified: Secondary | ICD-10-CM | POA: Diagnosis not present

## 2023-01-27 DIAGNOSIS — E039 Hypothyroidism, unspecified: Secondary | ICD-10-CM | POA: Diagnosis not present

## 2023-01-27 DIAGNOSIS — M8580 Other specified disorders of bone density and structure, unspecified site: Secondary | ICD-10-CM | POA: Diagnosis not present

## 2023-01-27 DIAGNOSIS — Z1389 Encounter for screening for other disorder: Secondary | ICD-10-CM | POA: Diagnosis not present

## 2023-01-27 DIAGNOSIS — M8589 Other specified disorders of bone density and structure, multiple sites: Secondary | ICD-10-CM | POA: Diagnosis not present

## 2023-01-27 DIAGNOSIS — Z1212 Encounter for screening for malignant neoplasm of rectum: Secondary | ICD-10-CM | POA: Diagnosis not present

## 2023-01-28 ENCOUNTER — Ambulatory Visit
Admission: RE | Admit: 2023-01-28 | Discharge: 2023-01-28 | Disposition: A | Discharge status: Home / Self Care | Payer: 59 | Source: Ambulatory Visit | Attending: Radiation Oncology | Admitting: Radiation Oncology

## 2023-01-28 ENCOUNTER — Encounter: Payer: Self-pay | Admitting: Radiation Oncology

## 2023-01-28 VITALS — BP 140/78 | HR 88 | Temp 97.3°F | Resp 18 | Ht 67.5 in | Wt 182.5 lb

## 2023-01-28 DIAGNOSIS — Z79621 Long term (current) use of calcineurin inhibitor: Secondary | ICD-10-CM | POA: Insufficient documentation

## 2023-01-28 DIAGNOSIS — C50411 Malignant neoplasm of upper-outer quadrant of right female breast: Secondary | ICD-10-CM | POA: Diagnosis not present

## 2023-01-28 DIAGNOSIS — Z79899 Other long term (current) drug therapy: Secondary | ICD-10-CM | POA: Insufficient documentation

## 2023-01-28 DIAGNOSIS — Z853 Personal history of malignant neoplasm of breast: Secondary | ICD-10-CM | POA: Diagnosis not present

## 2023-01-28 DIAGNOSIS — K219 Gastro-esophageal reflux disease without esophagitis: Secondary | ICD-10-CM | POA: Diagnosis not present

## 2023-01-28 DIAGNOSIS — Z510 Encounter for antineoplastic radiation therapy: Secondary | ICD-10-CM | POA: Insufficient documentation

## 2023-01-28 DIAGNOSIS — Z171 Estrogen receptor negative status [ER-]: Secondary | ICD-10-CM | POA: Insufficient documentation

## 2023-01-28 DIAGNOSIS — E039 Hypothyroidism, unspecified: Secondary | ICD-10-CM | POA: Diagnosis not present

## 2023-01-28 DIAGNOSIS — D0512 Intraductal carcinoma in situ of left breast: Secondary | ICD-10-CM | POA: Insufficient documentation

## 2023-01-28 DIAGNOSIS — Z170 Estrogen receptor positive status [ER+]: Secondary | ICD-10-CM | POA: Diagnosis not present

## 2023-01-28 NOTE — Addendum Note (Signed)
Encounter addended by: Ronny Bacon, PA-C on: 01/28/2023 3:28 PM  Actions taken: Clinical Note Signed

## 2023-01-28 NOTE — Progress Notes (Deleted)
Radiation Oncology         [phone] ________________________________  Name: Samantha [name]        MRN: [id number]  Date of Service: 01/28/2023 DOB: [birth year]  [id number], Loraine Leriche, MD  Malachy Mood, MD     REFERRING PHYSICIAN: Malachy Mood, MD   DIAGNOSIS: The primary encounter diagnosis was Malignant neoplasm of upper-outer quadrant of right breast in female, estrogen receptor negative (HCC). A diagnosis of Ductal carcinoma in situ (DCIS) of left breast was also pertinent to this visit.   HISTORY OF PRESENT ILLNESS: Samantha [name] is a 62 y.o. female with a history of  Stage IB, pT1bN0M0, grade 3 triple negative invasive ductal carcinoma of the right breast who was treated in 2023 with right lumpectomy on 01/08/2022 that showed a grade 3 invasive ductal carcinoma measuring 8 mm, her margins were negative for invasive disease but she also had associated DCIS which was less than 1 mm to the anterior margin.  She returned for sentinel lymph node biopsy on 01/28/2022, 2 nodes were submitted of the 3 specimens the third being fibroadipose tissue.  Both of the nodes were negative for malignancy.  Her Port-A-Cath had been inserted at that time as well and she had a complication of a pneumothorax following the procedure.  She was able to begin systemic chemotherapy on 02/20/2022 and completed this on 04/24/2022.  She then completed adjuvant radiotherapy to the right breast on 07/09/22.   She has been followed in surveillance since, and a diagnostic mammogram on 12/02/22 showed post treatment changes in the right breast, and an indeterminate group of calcifications measuring 3 mm in the central left breast. She had stereotactic biopsy on 12/04/22 that showed intermediate grade DCIS with necrosis and calcifications that was ER/PR negative. A lumpectomy on 01/01/23 showed a 2.5 cm area of intermediate grade DCIS; margins were negative for disease. She is seen to discuss adjuvant radiotherapy.  PREVIOUS RADIATION THERAPY:    06/12/22-07/09/22: The patient initially received a dose of 42.56 Gy in 16 fractions to the right breast using whole-breast tangent fields. This was delivered using a 3-D conformal technique. The patient then received a boost to the seroma. This delivered an additional 8 Gy in 44fractions using a 3 field photon technique due to the depth of the seroma. The total dose was 50.56 Gy.   PAST MEDICAL HISTORY:  Past Medical History:  Diagnosis Date   Allergy    Anxiety 2015   Result of culmination of super stressful caregiving and deaths of 2 immediate family members.  Overall do pretty well.   Cancer [facility]) 2023   right breast   Depression    denies   GERD (gastroesophageal reflux disease)    Hypothyroidism    Right carpal tunnel syndrome 09/19/2019   Thyroid disease        PAST SURGICAL HISTORY: Past Surgical History:  Procedure Laterality Date   BREAST BIOPSY Left 12/04/2022   MM LT BREAST BX W LOC DEV 1ST LESION IMAGE BX SPEC STEREO GUIDE 12/04/2022 [facility]   BREAST BIOPSY  12/30/2022   MM LT RADIOACTIVE SEED LOC MAMMO GUIDE 12/30/2022 [facility]   BREAST LUMPECTOMY WITH RADIOACTIVE SEED LOCALIZATION Right 01/08/2022   Procedure: RIGHT BREAST LUMPECTOMY WITH RADIOACTIVE SEED LOCALIZATION;  Surgeon: [name], MD;  Location: [facility] OR;  Service: General;  Laterality: Right;   BREAST LUMPECTOMY WITH RADIOACTIVE SEED LOCALIZATION Left 01/01/2023   Procedure: LEFT BREAST SEED LOCALIZED LUMPECTOMY;  Surgeon: [name], MD;  Location:  [facility] OR;  Service: General;  Laterality: Left;   PORT-A-CATH REMOVAL Left 05/20/2022   Procedure: REMOVAL PORT-A-CATH;  Surgeon: [name], MD;  Location: [facility];  Service: General;  Laterality: Left;   PORTACATH PLACEMENT Left 01/28/2022   Procedure: INSERTION PORT-A-CATH;  Surgeon: [name], MD;  Location: [facility];  Service: General;  Laterality: Left;   SENTINEL NODE BIOPSY Right 01/28/2022    Procedure: RIGHT SENTINEL LYMPH NODE BIOPSY;  Surgeon: [name], MD;  Location:  SURGERY CENTER;  Service: General;  Laterality: Right;   TONSILLECTOMY       FAMILY HISTORY:  Family History  Problem Relation Age of Onset   Hyperlipidemia Father    Diabetes Maternal Grandmother      SOCIAL HISTORY:  reports that she has never smoked. She has never used smokeless tobacco. She reports current alcohol use. She reports that she does not use drugs. The patient is married and lives in [place]. She is married and a homemaker. She's accompanied by her husband. They have Wyka grandchildren they care for and enjoy spending time with. She also enjoys Electrical engineer.    ALLERGIES: Nortriptyline, Singulair [montelukast sodium], Sulfonamide derivatives, Amitriptyline hcl, Esomeprazole, Gabapentin, Lexapro [escitalopram], and Neosporin [bacitracin-polymyxin b]   MEDICATIONS:  Current Outpatient Medications  Medication Sig Dispense Refill   acetaminophen (TYLENOL) 500 MG tablet Take 1,000 mg by mouth every 6 (six) hours as needed for moderate pain.     betamethasone dipropionate 0.05 % cream Apply 1 Application topically 2 (two) times daily.     Calcium Carb-Cholecalciferol (CALCIUM-VITAMIN D3) 250-125 MG-UNIT TABS Take 1 tablet by mouth daily.     Cholecalciferol (VITAMIN D3) 50 MCG (2000 UT) TABS Take 2,000 Units by mouth daily.     famotidine (PEPCID) 20 MG tablet Take 1 tablet (20 mg total) by mouth daily. (Patient taking differently: Take 20 mg by mouth daily as needed for heartburn or indigestion.)     Fezolinetant (VEOZAH) 45 MG TABS Take 45 mg by mouth daily.     HYDROcodone-acetaminophen (NORCO/VICODIN) 5-325 MG tablet Take 1 tablet by mouth every 6 (six) hours as needed for moderate pain. 10 tablet 0   hydrocortisone 1 % lotion Apply 1 Application topically 2 (two) times daily. (Patient not taking: Reported on 12/18/2022) 118 mL 0   Levothyroxine Sodium 88 MCG CAPS Take 88 mcg  by mouth See admin instructions. Take 88 mcg by mouth for two days and skip a day then repeat cycle.  10   methocarbamol (ROBAXIN) 500 MG tablet Take 1 tablet (500 mg total) by mouth 4 (four) times daily as needed for muscle spasms. 60 tablet 0   nystatin-triamcinolone (MYCOLOG II) cream Apply 1 Application topically 2 (two) times daily as needed (irritation).     sertraline (ZOLOFT) 50 MG tablet Take 50 mg by mouth daily.     tacrolimus (PROTOPIC) 0.03 % ointment Apply 1 Application topically 2 (two) times daily.     TIROSINT 100 MCG CAPS Take 100 mcg by mouth every 3 (three) days.     No current facility-administered medications for this encounter.     REVIEW OF SYSTEMS: On review of systems, the patient reports that she is doing well overall without concerns of her healing. No other complaints are verbalized.      PHYSICAL EXAM:  Wt Readings from Last 3 Encounters:  01/28/23 182 lb 8 oz (82.8 kg)  01/01/23 180 lb (81.6 kg)  12/26/22 178 lb 12.8 oz (81.1  kg)   Temp Readings from Last 3 Encounters:  01/28/23 (!) 97.3 F (36.3 C) (Temporal)  01/01/23 (!) 97.5 F (36.4 C)  12/26/22 98.4 F (36.9 C)   BP Readings from Last 3 Encounters:  01/28/23 (!) 140/78  01/01/23 (!) 142/75  12/26/22 110/63   Pulse Readings from Last 3 Encounters:  01/28/23 88  01/01/23 95  12/26/22 78    In general this is a well appearing caucasian female in no acute distress. She's alert and oriented x4 and appropriate throughout the examination. Cardiopulmonary assessment is negative for acute distress and she exhibits normal effort.  Her left breast incision is well-healed without erythema, separation, or drainage.    ECOG = 0  0 - Asymptomatic (Fully active, able to carry on all predisease activities without restriction)  1 - Symptomatic but completely ambulatory (Restricted in physically strenuous activity but ambulatory and able to carry out work of a light or sedentary nature. For example,  light housework, office work)  2 - Symptomatic, <50% in bed during the day (Ambulatory and capable of all self care but unable to carry out any work activities. Up and about more than 50% of waking hours)  3 - Symptomatic, >50% in bed, but not bedbound (Capable of only limited self-care, confined to bed or chair 50% or more of waking hours)  4 - Bedbound (Completely disabled. Cannot carry on any self-care. Totally confined to bed or chair)  5 - Death   [name] MM, Creech RH, Tormey DC, et al. [phone]). "Toxicity and response criteria of the [facility] Group". Am. Evlyn Clines. Oncol. 5 (6): 649-55    LABORATORY DATA:  Lab Results  Component Value Date   WBC 8.4 12/09/2022   HGB 14.3 12/09/2022   HCT 43.4 12/09/2022   MCV 95.0 12/09/2022   PLT 270 12/09/2022   Lab Results  Component Value Date   NA 141 12/09/2022   K 3.9 12/09/2022   CL 105 12/09/2022   CO2 28 12/09/2022   Lab Results  Component Value Date   ALT 23 12/09/2022   AST 21 12/09/2022   ALKPHOS 113 12/09/2022   BILITOT 0.4 12/09/2022      RADIOGRAPHY: MM Breast Surgical Specimen  Result Date: 01/01/2023 CLINICAL DATA:  Post excision of a left breast lesion following radioactive seed localization. Assess surgical specimen. EXAM: SPECIMEN RADIOGRAPH OF THE LEFT BREAST COMPARISON:  Previous exam(s). FINDINGS: Status post excision of the left breast. The radioactive seed and biopsy marker clip are present, completely intact, and were marked for pathology. IMPRESSION: Specimen radiograph of the left breast. Electronically Signed   By: Amie Portland M.D.   On: 01/01/2023 12:41   MM LT RADIOACTIVE SEED LOC MAMMO GUIDE  Result Date: 12/30/2022 CLINICAL DATA:  Patient presents for radioactive seed localization of left breast DCIS prior to surgical excision. EXAM: MAMMOGRAPHIC GUIDED RADIOACTIVE SEED LOCALIZATION OF THE LEFT BREAST COMPARISON:  Previous exam(s). FINDINGS: Patient presents for radioactive seed  localization prior to surgical excision. I met with the patient and we discussed the procedure of seed localization including benefits and alternatives. We discussed the high likelihood of a successful procedure. We discussed the risks of the procedure including infection, bleeding, tissue injury and further surgery. We discussed the low dose of radioactivity involved in the procedure. Informed, written consent was given. The usual time-out protocol was performed immediately prior to the procedure. Using mammographic guidance, sterile technique, 1% lidocaine and an I-125 radioactive seed, the ribbon shaped post biopsy  marker clip was localized using a superior approach. The follow-up mammogram images confirm the seed in the expected location and were marked for Dr. Donell Beers. Follow-up survey of the patient confirms presence of the radioactive seed. Order number of I-125 seed:  [id number]. Total activity:  0.252 millicuries.  Reference Date: 12/04/2022. The patient tolerated the procedure well and was [facility]. She was given instructions regarding seed removal. IMPRESSION: Radioactive seed localization of the left breast. No apparent complications. Electronically Signed   By: Amie Portland M.D.   On: 12/30/2022 15:16       IMPRESSION/PLAN: 1. Intermediate grade ER/PR negative DCIS of the left breast. Dr. Mitzi Hansen has reviewed her pathology findings and today we discussed the nature of noninvasive breast disease. She has done well since surgery and Dr. Mitzi Hansen recommends external radiotherapy to the left breast to reduce risks of local recurrence. We discussed the risks, benefits, short, and long term effects of radiotherapy, as well as the curative intent, and the patient is interested in proceeding. We reviewed the delivery and logistics of radiotherapy and that similar to her previous therapy, Dr. Mitzi Hansen recommends 4 weeks of radiotherapy to the left breast with deep inspiration breath hold  technique. Written consent is obtained and placed in the chart, a copy was provided to the patient. She will simulate today. 2. Stage IB, pT1bN0M0, grade 3 triple negative invasive ductal carcinoma of the right breast. She remains in remission and will continue to be followed for this in surveillance as she proceeds with treatment of #1. 3. Possible genetic predisposition to malignancy. The patient is a candidate for genetic testing given her personal history. She discusses she would not be interested in testing as knowing this information may be more anxiety provoking. I suggested she discuss this further with Dr. Mosetta Putt in case this testing result would impact any treatment decisions.   In a visit lasting 45 minutes, greater than 50% of the time was spent face to face reviewing her case, as well as in preparation of, discussing, and coordinating the patient's care.      Osker Mason, [facility]    **Disclaimer: This note was dictated with voice recognition software. Similar sounding words can inadvertently be transcribed and this note may contain transcription errors which may not have been corrected upon publication of note.**

## 2023-01-28 NOTE — Progress Notes (Addendum)
Nursing interview, in person for A diagnosis of Ductal carcinoma in situ (DCIS) of "left" breast.   Past history of Malignant neoplasm of upper-outer quadrant of right breast in female, estrogen receptor negative (HCC).   Patient identity verified x2.  Patient reports doing well. Denies chest pains or SOB. No issues conveyed at this time.  Meaningful use complete.  Vitals- BP (!) 140/78 (BP Location: Left Arm, Patient Position: Sitting, Cuff Size: Normal)   Pulse 88   Temp (!) 97.3 F (36.3 C) (Temporal)   Resp 18   Ht 5' 7.5" (1.715 m)   Wt 182 lb 8 oz (82.8 kg)   SpO2 98%   BMI 28.16 kg/m   This concludes the interaction.  Ruel Favors, LPN

## 2023-02-03 ENCOUNTER — Encounter: Payer: Self-pay | Admitting: *Deleted

## 2023-02-03 DIAGNOSIS — D0512 Intraductal carcinoma in situ of left breast: Secondary | ICD-10-CM

## 2023-02-04 NOTE — Progress Notes (Signed)
Attestation signed by Dorothy Puffer, MD at 01/29/2023  9:10 AM   Please see the note from Laurence Aly, PA-C from today's visit for more details of today's encounter.  I have personally performed a face to face diagnostic evaluation on this patient and devised the following assessment and plan.   The patient returns to clinic today for follow-up for her diagnosis of breast cancer. The patient has completed a lumpectomy and will not receive chemotherapy with the patient's final pathology showing DCIS. The patient is therefore appropriate to proceed with adjuvant radiation treatment at this time. We discussed proceeding with a course of radiation treatment to the left breast using tangent whole breast radiation treatment fields for 4 weeks. All of her questions were answered and she wishes to proceed with treatment. The patient will proceed with simulation for treatment planning.   Staging - [id number] left breast     Dorothy Puffer, MD          Expand All Collapse All  Radiation Oncology         [phone]) [phone] ________________________________   Name: Samantha [name]          MRN: [id number]  Date of Service: 01/28/2023 DOB: 1960-06-11   WJ:[id number], Loraine Leriche, MD  Malachy Mood, MD      REFERRING PHYSICIAN: Malachy Mood, MD     DIAGNOSIS: The primary encounter diagnosis was Malignant neoplasm of upper-outer quadrant of right breast in female, estrogen receptor negative (HCC). A diagnosis of Ductal carcinoma in situ (DCIS) of left breast was also pertinent to this visit.     HISTORY OF PRESENT ILLNESS: Samantha [name] is a 62 y.o. female with a history of  Stage IB, pT1bN0M0, grade 3 triple negative invasive ductal carcinoma of the right breast who was treated in 2023 with right lumpectomy on 01/08/2022 that showed a grade 3 invasive ductal carcinoma measuring 8 mm, her margins were negative for invasive disease but she also had associated DCIS which was less than 1 mm to the anterior margin.  She returned for sentinel  lymph node biopsy on 01/28/2022, 2 nodes were submitted of the 3 specimens the third being fibroadipose tissue.  Both of the nodes were negative for malignancy.  Her Port-A-Cath had been inserted at that time as well and she had a complication of a pneumothorax following the procedure.  She was able to begin systemic chemotherapy on 02/20/2022 and completed this on 04/24/2022.  She then completed adjuvant radiotherapy to the right breast on 07/09/22.    She has been followed in surveillance since, and a diagnostic mammogram on 12/02/22 showed post treatment changes in the right breast, and an indeterminate group of calcifications measuring 3 mm in the central left breast. She had stereotactic biopsy on 12/04/22 that showed intermediate grade DCIS with necrosis and calcifications that was ER/PR negative. A lumpectomy on 01/01/23 showed a 2.5 cm area of intermediate grade DCIS; margins were negative for disease. She is seen to discuss adjuvant radiotherapy.   PREVIOUS RADIATION THERAPY:    06/12/22-07/09/22: The patient initially received a dose of 42.56 Gy in 16 fractions to the right breast using whole-breast tangent fields. This was delivered using a 3-D conformal technique. The patient then received a boost to the seroma. This delivered an additional 8 Gy in 13fractions using a 3 field photon technique due to the depth of the seroma. The total dose was 50.56 Gy.     PAST MEDICAL HISTORY:      Past Medical History:  Diagnosis Date   Allergy     Anxiety 2015    Result of culmination of super stressful caregiving and deaths of 2 immediate family members.  Overall do pretty well.   Cancer [facility]) 2023    right breast   Depression      denies   GERD (gastroesophageal reflux disease)     Hypothyroidism     Right carpal tunnel syndrome 09/19/2019   Thyroid disease               PAST SURGICAL HISTORY:      Past Surgical History:  Procedure Laterality Date   BREAST BIOPSY Left 12/04/2022    MM LT  BREAST BX W LOC DEV 1ST LESION IMAGE BX SPEC STEREO GUIDE 12/04/2022 [facility]   BREAST BIOPSY   12/30/2022    MM LT RADIOACTIVE SEED LOC MAMMO GUIDE 12/30/2022 [facility]   BREAST LUMPECTOMY WITH RADIOACTIVE SEED LOCALIZATION Right 01/08/2022    Procedure: RIGHT BREAST LUMPECTOMY WITH RADIOACTIVE SEED LOCALIZATION;  Surgeon: [name], MD;  Location: [facility];  Service: General;  Laterality: Right;   BREAST LUMPECTOMY WITH RADIOACTIVE SEED LOCALIZATION Left 01/01/2023    Procedure: LEFT BREAST SEED LOCALIZED LUMPECTOMY;  Surgeon: [name], MD;  Location: [facility] OR;  Service: General;  Laterality: Left;   PORT-A-CATH REMOVAL Left 05/20/2022    Procedure: REMOVAL PORT-A-CATH;  Surgeon: [name], MD;  Location: [facility];  Service: General;  Laterality: Left;   PORTACATH PLACEMENT Left 01/28/2022    Procedure: INSERTION PORT-A-CATH;  Surgeon: [name], MD;  Location: [facility];  Service: General;  Laterality: Left;   SENTINEL NODE BIOPSY Right 01/28/2022    Procedure: RIGHT SENTINEL LYMPH NODE BIOPSY;  Surgeon: [name], MD;  Location: [facility];  Service: General;  Laterality: Right;   TONSILLECTOMY                FAMILY HISTORY:       Family History  Problem Relation Age of Onset   Hyperlipidemia Father     Diabetes Maternal Grandmother              SOCIAL HISTORY:  reports that she has never smoked. She has never used smokeless tobacco. She reports current alcohol use. She reports that she does not use drugs. The patient is married and lives in [place]. She is married and a homemaker. She's accompanied by her husband. They have [place] grandchildren they care for and enjoy spending time with.       ALLERGIES: Nortriptyline, Singulair [montelukast sodium], Sulfonamide derivatives, Amitriptyline hcl, Esomeprazole, Gabapentin, Lexapro [escitalopram], and Neosporin [bacitracin-polymyxin b]     MEDICATIONS:         Current Outpatient Medications  Medication Sig Dispense Refill   acetaminophen (TYLENOL) 500 MG tablet Take 1,000 mg by mouth every 6 (six) hours as needed for moderate pain.       betamethasone dipropionate 0.05 % cream Apply 1 Application topically 2 (two) times daily.       Calcium Carb-Cholecalciferol (CALCIUM-VITAMIN D3) 250-125 MG-UNIT TABS Take 1 tablet by mouth daily.       Cholecalciferol (VITAMIN D3) 50 MCG (2000 UT) TABS Take 2,000 Units by mouth daily.       famotidine (PEPCID) 20 MG tablet Take 1 tablet (20 mg total) by mouth daily. (Patient taking differently: Take 20 mg by mouth daily as needed for heartburn or indigestion.)       Fezolinetant (VEOZAH) 45 MG TABS Take  45 mg by mouth daily.       HYDROcodone-acetaminophen (NORCO/VICODIN) 5-325 MG tablet Take 1 tablet by mouth every 6 (six) hours as needed for moderate pain. 10 tablet 0   hydrocortisone 1 % lotion Apply 1 Application topically 2 (two) times daily. (Patient not taking: Reported on 12/18/2022) 118 mL 0   Levothyroxine Sodium 88 MCG CAPS Take 88 mcg by mouth See admin instructions. Take 88 mcg by mouth for two days and skip a day then repeat cycle.   10   methocarbamol (ROBAXIN) 500 MG tablet Take 1 tablet (500 mg total) by mouth 4 (four) times daily as needed for muscle spasms. 60 tablet 0   nystatin-triamcinolone (MYCOLOG II) cream Apply 1 Application topically 2 (two) times daily as needed (irritation).       sertraline (ZOLOFT) 50 MG tablet Take 50 mg by mouth daily.       tacrolimus (PROTOPIC) 0.03 % ointment Apply 1 Application topically 2 (two) times daily.       TIROSINT 100 MCG CAPS Take 100 mcg by mouth every 3 (three) days.          No current facility-administered medications for this encounter.          REVIEW OF SYSTEMS: On review of systems, the patient reports that she is doing well overall without concerns of her healing. No other complaints are verbalized.        PHYSICAL EXAM:     Wt  Readings from Last 3 Encounters:  01/28/23 182 lb 8 oz (82.8 kg)  01/01/23 180 lb (81.6 kg)  12/26/22 178 lb 12.8 oz (81.1 kg)       Temp Readings from Last 3 Encounters:  01/28/23 (!) 97.3 F (36.3 C) (Temporal)  01/01/23 (!) 97.5 F (36.4 C)  12/26/22 98.4 F (36.9 C)       BP Readings from Last 3 Encounters:  01/28/23 (!) 140/78  01/01/23 (!) 142/75  12/26/22 110/63       Pulse Readings from Last 3 Encounters:  01/28/23 88  01/01/23 95  12/26/22 78      In general this is a well appearing caucasian female in no acute distress. She's alert and oriented x4 and appropriate throughout the examination. Cardiopulmonary assessment is negative for acute distress and she exhibits normal effort.  Her left breast incision is well-healed without erythema, separation, or drainage.       ECOG = 0   0 - Asymptomatic (Fully active, able to carry on all predisease activities without restriction)   1 - Symptomatic but completely ambulatory (Restricted in physically strenuous activity but ambulatory and able to carry out work of a light or sedentary nature. For example, light housework, office work)   2 - Symptomatic, <50% in bed during the day (Ambulatory and capable of all self care but unable to carry out any work activities. Up and about more than 50% of waking hours)   3 - Symptomatic, >50% in bed, but not bedbound (Capable of only limited self-care, confined to bed or chair 50% or more of waking hours)   4 - Bedbound (Completely disabled. Cannot carry on any self-care. Totally confined to bed or chair)   5 - Death    [name] MM, Creech RH, Tormey DC, et al. [phone]). "Toxicity and response criteria of the [facility]". Am. Evlyn Clines. Oncol. 5 (6): 649-55       LABORATORY DATA:  Recent Labs       Lab  Results  Component Value Date    WBC 8.4 12/09/2022    HGB 14.3 12/09/2022    HCT 43.4 12/09/2022    MCV 95.0 12/09/2022    PLT 270 12/09/2022       Recent Labs       Lab Results  Component Value Date    NA 141 12/09/2022    K 3.9 12/09/2022    CL 105 12/09/2022    CO2 28 12/09/2022      Recent Labs       Lab Results  Component Value Date    ALT 23 12/09/2022    AST 21 12/09/2022    ALKPHOS 113 12/09/2022    BILITOT 0.4 12/09/2022          RADIOGRAPHY:  Imaging Results  MM Breast Surgical Specimen   Result Date: 01/01/2023 CLINICAL DATA:  Post excision of a left breast lesion following radioactive seed localization. Assess surgical specimen. EXAM: SPECIMEN RADIOGRAPH OF THE LEFT BREAST COMPARISON:  Previous exam(s). FINDINGS: Status post excision of the left breast. The radioactive seed and biopsy marker clip are present, completely intact, and were marked for pathology. IMPRESSION: Specimen radiograph of the left breast. Electronically Signed   By: Amie Portland M.D.   On: 01/01/2023 12:41    MM LT RADIOACTIVE SEED LOC MAMMO GUIDE   Result Date: 12/30/2022 CLINICAL DATA:  Patient presents for radioactive seed localization of left breast DCIS prior to surgical excision. EXAM: MAMMOGRAPHIC GUIDED RADIOACTIVE SEED LOCALIZATION OF THE LEFT BREAST COMPARISON:  Previous exam(s). FINDINGS: Patient presents for radioactive seed localization prior to surgical excision. I met with the patient and we discussed the procedure of seed localization including benefits and alternatives. We discussed the high likelihood of a successful procedure. We discussed the risks of the procedure including infection, bleeding, tissue injury and further surgery. We discussed the low dose of radioactivity involved in the procedure. Informed, written consent was given. The usual time-out protocol was performed immediately prior to the procedure. Using mammographic guidance, sterile technique, 1% lidocaine and an I-125 radioactive seed, the ribbon shaped post biopsy marker clip was localized using a superior approach. The follow-up mammogram images confirm the  seed in the expected location and were marked for Dr. Donell Beers. Follow-up survey of the patient confirms presence of the radioactive seed. Order number of I-125 seed:  [id number]. Total activity:  0.252 millicuries.  Reference Date: 12/04/2022. The patient tolerated the procedure well and was [facility]. She was given instructions regarding seed removal. IMPRESSION: Radioactive seed localization of the left breast. No apparent complications. Electronically Signed   By: Amie Portland M.D.   On: 12/30/2022 15:16          IMPRESSION/PLAN: 1.         Intermediate grade ER/PR negative DCIS of the left breast. Dr. Mitzi Hansen has reviewed her pathology findings and today we discussed the nature of noninvasive breast disease. She has done well since surgery and Dr. Mitzi Hansen recommends external radiotherapy to the left breast to reduce risks of local recurrence. We discussed the risks, benefits, short, and long term effects of radiotherapy, as well as the curative intent, and the patient is interested in proceeding. We reviewed the delivery and logistics of radiotherapy and that similar to her previous therapy, Dr. Mitzi Hansen recommends 4 weeks of radiotherapy to the left breast with deep inspiration breath hold technique. Written consent is obtained and placed in the chart, a copy was provided to the patient. She will  simulate today. 2.         Stage IB, pT1bN0M0, grade 3 triple negative invasive ductal carcinoma of the right breast. She remains in remission and will continue to be followed for this in surveillance as she proceeds with treatment of #1. 3.         Possible genetic predisposition to malignancy. The patient is a candidate for genetic testing given her personal history. She discusses she would not be interested in testing as knowing this information may be more anxiety provoking. I suggested she discuss this further with Dr. Mosetta Putt in case this testing result would impact any treatment decisions.      In a visit lasting 45 minutes, greater than 50% of the time was spent face to face reviewing her case, as well as in preparation of, discussing, and coordinating the patient's care.        Osker Mason, PAC

## 2023-02-04 NOTE — Addendum Note (Signed)
Encounter addended by: Ronny Bacon, PA-C on: 02/04/2023 12:28 PM  Actions taken: Delete clinical note

## 2023-02-04 NOTE — Addendum Note (Signed)
Encounter addended by: Ronny Bacon, PA-C on: 02/04/2023 12:28 PM  Actions taken: Clinical Note Signed

## 2023-02-05 DIAGNOSIS — Z6828 Body mass index (BMI) 28.0-28.9, adult: Secondary | ICD-10-CM | POA: Diagnosis not present

## 2023-02-05 DIAGNOSIS — M8580 Other specified disorders of bone density and structure, unspecified site: Secondary | ICD-10-CM | POA: Diagnosis not present

## 2023-02-05 DIAGNOSIS — K219 Gastro-esophageal reflux disease without esophagitis: Secondary | ICD-10-CM | POA: Diagnosis not present

## 2023-02-05 DIAGNOSIS — C50911 Malignant neoplasm of unspecified site of right female breast: Secondary | ICD-10-CM | POA: Diagnosis not present

## 2023-02-05 DIAGNOSIS — M4302 Spondylolysis, cervical region: Secondary | ICD-10-CM | POA: Diagnosis not present

## 2023-02-05 DIAGNOSIS — F418 Other specified anxiety disorders: Secondary | ICD-10-CM | POA: Diagnosis not present

## 2023-02-05 DIAGNOSIS — R7989 Other specified abnormal findings of blood chemistry: Secondary | ICD-10-CM | POA: Diagnosis not present

## 2023-02-05 DIAGNOSIS — Z23 Encounter for immunization: Secondary | ICD-10-CM | POA: Diagnosis not present

## 2023-02-05 DIAGNOSIS — R7303 Prediabetes: Secondary | ICD-10-CM | POA: Diagnosis not present

## 2023-02-05 DIAGNOSIS — R7301 Impaired fasting glucose: Secondary | ICD-10-CM | POA: Diagnosis not present

## 2023-02-05 DIAGNOSIS — Z0000 Encounter for general adult medical examination without abnormal findings: Secondary | ICD-10-CM | POA: Diagnosis not present

## 2023-02-05 DIAGNOSIS — E039 Hypothyroidism, unspecified: Secondary | ICD-10-CM | POA: Diagnosis not present

## 2023-02-05 DIAGNOSIS — E663 Overweight: Secondary | ICD-10-CM | POA: Diagnosis not present

## 2023-02-05 DIAGNOSIS — E785 Hyperlipidemia, unspecified: Secondary | ICD-10-CM | POA: Diagnosis not present

## 2023-02-05 DIAGNOSIS — J302 Other seasonal allergic rhinitis: Secondary | ICD-10-CM | POA: Diagnosis not present

## 2023-02-09 ENCOUNTER — Encounter: Payer: Self-pay | Admitting: Hematology

## 2023-02-09 NOTE — Addendum Note (Signed)
Encounter addended by: Birdena Crandall, LPN on: 95/18/8416 12:40 PM  Actions taken: Clinical Note Signed

## 2023-02-10 DIAGNOSIS — Z510 Encounter for antineoplastic radiation therapy: Secondary | ICD-10-CM | POA: Diagnosis present

## 2023-02-10 DIAGNOSIS — C50411 Malignant neoplasm of upper-outer quadrant of right female breast: Secondary | ICD-10-CM | POA: Diagnosis present

## 2023-02-11 ENCOUNTER — Encounter: Payer: Self-pay | Admitting: Radiation Oncology

## 2023-02-11 ENCOUNTER — Other Ambulatory Visit: Payer: Self-pay

## 2023-02-11 ENCOUNTER — Ambulatory Visit: Payer: 59 | Admitting: Radiation Oncology

## 2023-02-11 ENCOUNTER — Ambulatory Visit
Admission: RE | Admit: 2023-02-11 | Discharge: 2023-02-11 | Disposition: A | Discharge status: Home / Self Care | Payer: 59 | Source: Ambulatory Visit | Attending: Radiation Oncology | Admitting: Radiation Oncology

## 2023-02-11 DIAGNOSIS — Z170 Estrogen receptor positive status [ER+]: Secondary | ICD-10-CM | POA: Diagnosis not present

## 2023-02-11 DIAGNOSIS — Z510 Encounter for antineoplastic radiation therapy: Secondary | ICD-10-CM | POA: Diagnosis not present

## 2023-02-11 DIAGNOSIS — C50411 Malignant neoplasm of upper-outer quadrant of right female breast: Secondary | ICD-10-CM | POA: Diagnosis not present

## 2023-02-11 LAB — RAD ONC ARIA SESSION SUMMARY
Course Elapsed Days: 0
Plan Fractions Treated to Date: 1
Plan Prescribed Dose Per Fraction: 2.66 Gy
Plan Total Fractions Prescribed: 16
Plan Total Prescribed Dose: 42.56 Gy
Reference Point Dosage Given to Date: 2.66 Gy
Reference Point Session Dosage Given: 2.66 Gy
Session Number: 1

## 2023-02-12 ENCOUNTER — Other Ambulatory Visit: Payer: Self-pay

## 2023-02-12 ENCOUNTER — Ambulatory Visit
Admission: RE | Admit: 2023-02-12 | Discharge: 2023-02-12 | Disposition: A | Discharge status: Home / Self Care | Payer: 59 | Source: Ambulatory Visit | Attending: Radiation Oncology | Admitting: Radiation Oncology

## 2023-02-12 ENCOUNTER — Ambulatory Visit: Payer: 59

## 2023-02-12 DIAGNOSIS — C50411 Malignant neoplasm of upper-outer quadrant of right female breast: Secondary | ICD-10-CM | POA: Diagnosis not present

## 2023-02-12 DIAGNOSIS — Z170 Estrogen receptor positive status [ER+]: Secondary | ICD-10-CM | POA: Diagnosis not present

## 2023-02-12 DIAGNOSIS — Z510 Encounter for antineoplastic radiation therapy: Secondary | ICD-10-CM | POA: Diagnosis not present

## 2023-02-12 LAB — RAD ONC ARIA SESSION SUMMARY
Course Elapsed Days: 1
Plan Fractions Treated to Date: 2
Plan Prescribed Dose Per Fraction: 2.66 Gy
Plan Total Fractions Prescribed: 16
Plan Total Prescribed Dose: 42.56 Gy
Reference Point Dosage Given to Date: 5.32 Gy
Reference Point Session Dosage Given: 2.66 Gy
Session Number: 2

## 2023-02-13 ENCOUNTER — Ambulatory Visit: Payer: 59

## 2023-02-13 ENCOUNTER — Ambulatory Visit
Admission: RE | Admit: 2023-02-13 | Discharge: 2023-02-13 | Disposition: A | Discharge status: Home / Self Care | Payer: 59 | Source: Ambulatory Visit | Attending: Radiation Oncology | Admitting: Radiation Oncology

## 2023-02-13 ENCOUNTER — Other Ambulatory Visit: Payer: Self-pay

## 2023-02-13 DIAGNOSIS — R5383 Other fatigue: Secondary | ICD-10-CM | POA: Diagnosis not present

## 2023-02-13 DIAGNOSIS — Z79899 Other long term (current) drug therapy: Secondary | ICD-10-CM | POA: Insufficient documentation

## 2023-02-13 DIAGNOSIS — C50411 Malignant neoplasm of upper-outer quadrant of right female breast: Secondary | ICD-10-CM | POA: Insufficient documentation

## 2023-02-13 DIAGNOSIS — N6331 Unspecified lump in axillary tail of the right breast: Secondary | ICD-10-CM | POA: Insufficient documentation

## 2023-02-13 DIAGNOSIS — Z9089 Acquired absence of other organs: Secondary | ICD-10-CM | POA: Insufficient documentation

## 2023-02-13 DIAGNOSIS — Z79621 Long term (current) use of calcineurin inhibitor: Secondary | ICD-10-CM | POA: Insufficient documentation

## 2023-02-13 DIAGNOSIS — Z888 Allergy status to other drugs, medicaments and biological substances status: Secondary | ICD-10-CM | POA: Diagnosis not present

## 2023-02-13 DIAGNOSIS — Z1722 Progesterone receptor negative status: Secondary | ICD-10-CM | POA: Insufficient documentation

## 2023-02-13 DIAGNOSIS — F419 Anxiety disorder, unspecified: Secondary | ICD-10-CM | POA: Insufficient documentation

## 2023-02-13 DIAGNOSIS — Z170 Estrogen receptor positive status [ER+]: Secondary | ICD-10-CM | POA: Diagnosis not present

## 2023-02-13 DIAGNOSIS — Z882 Allergy status to sulfonamides status: Secondary | ICD-10-CM | POA: Diagnosis not present

## 2023-02-13 DIAGNOSIS — Z923 Personal history of irradiation: Secondary | ICD-10-CM | POA: Diagnosis not present

## 2023-02-13 DIAGNOSIS — Z510 Encounter for antineoplastic radiation therapy: Secondary | ICD-10-CM | POA: Insufficient documentation

## 2023-02-13 DIAGNOSIS — Z171 Estrogen receptor negative status [ER-]: Secondary | ICD-10-CM | POA: Diagnosis not present

## 2023-02-13 LAB — RAD ONC ARIA SESSION SUMMARY
Course Elapsed Days: 2
Plan Fractions Treated to Date: 3
Plan Prescribed Dose Per Fraction: 2.66 Gy
Plan Total Fractions Prescribed: 16
Plan Total Prescribed Dose: 42.56 Gy
Reference Point Dosage Given to Date: 7.98 Gy
Reference Point Session Dosage Given: 2.66 Gy
Session Number: 3

## 2023-02-16 ENCOUNTER — Other Ambulatory Visit: Payer: Self-pay | Admitting: Sports Medicine

## 2023-02-16 ENCOUNTER — Other Ambulatory Visit: Payer: Self-pay

## 2023-02-16 ENCOUNTER — Ambulatory Visit: Payer: 59

## 2023-02-16 ENCOUNTER — Ambulatory Visit
Admission: RE | Admit: 2023-02-16 | Discharge: 2023-02-16 | Disposition: A | Discharge status: Home / Self Care | Payer: 59 | Source: Ambulatory Visit | Attending: Radiation Oncology | Admitting: Radiation Oncology

## 2023-02-16 DIAGNOSIS — Z510 Encounter for antineoplastic radiation therapy: Secondary | ICD-10-CM | POA: Diagnosis not present

## 2023-02-16 LAB — RAD ONC ARIA SESSION SUMMARY
Course Elapsed Days: 5
Plan Fractions Treated to Date: 4
Plan Prescribed Dose Per Fraction: 2.66 Gy
Plan Total Fractions Prescribed: 16
Plan Total Prescribed Dose: 42.56 Gy
Reference Point Dosage Given to Date: 10.64 Gy
Reference Point Session Dosage Given: 2.66 Gy
Session Number: 4

## 2023-02-17 ENCOUNTER — Other Ambulatory Visit: Payer: Self-pay

## 2023-02-17 ENCOUNTER — Ambulatory Visit: Payer: 59

## 2023-02-17 ENCOUNTER — Ambulatory Visit
Admission: RE | Admit: 2023-02-17 | Discharge: 2023-02-17 | Disposition: A | Discharge status: Home / Self Care | Payer: 59 | Source: Ambulatory Visit | Attending: Radiation Oncology | Admitting: Radiation Oncology

## 2023-02-17 DIAGNOSIS — Z510 Encounter for antineoplastic radiation therapy: Secondary | ICD-10-CM | POA: Diagnosis not present

## 2023-02-17 DIAGNOSIS — C50411 Malignant neoplasm of upper-outer quadrant of right female breast: Secondary | ICD-10-CM | POA: Diagnosis not present

## 2023-02-17 DIAGNOSIS — Z170 Estrogen receptor positive status [ER+]: Secondary | ICD-10-CM | POA: Diagnosis not present

## 2023-02-17 LAB — RAD ONC ARIA SESSION SUMMARY
Course Elapsed Days: 6
Plan Fractions Treated to Date: 5
Plan Prescribed Dose Per Fraction: 2.66 Gy
Plan Total Fractions Prescribed: 16
Plan Total Prescribed Dose: 42.56 Gy
Reference Point Dosage Given to Date: 13.3 Gy
Reference Point Session Dosage Given: 2.66 Gy
Session Number: 5

## 2023-02-18 ENCOUNTER — Ambulatory Visit
Admission: RE | Admit: 2023-02-18 | Discharge: 2023-02-18 | Disposition: A | Discharge status: Home / Self Care | Payer: 59 | Source: Ambulatory Visit | Attending: Radiation Oncology | Admitting: Radiation Oncology

## 2023-02-18 ENCOUNTER — Ambulatory Visit: Payer: 59

## 2023-02-18 ENCOUNTER — Other Ambulatory Visit: Payer: Self-pay

## 2023-02-18 DIAGNOSIS — Z79899 Other long term (current) drug therapy: Secondary | ICD-10-CM | POA: Diagnosis not present

## 2023-02-18 DIAGNOSIS — Z170 Estrogen receptor positive status [ER+]: Secondary | ICD-10-CM | POA: Diagnosis not present

## 2023-02-18 DIAGNOSIS — Z510 Encounter for antineoplastic radiation therapy: Secondary | ICD-10-CM | POA: Diagnosis not present

## 2023-02-18 DIAGNOSIS — C50411 Malignant neoplasm of upper-outer quadrant of right female breast: Secondary | ICD-10-CM | POA: Diagnosis not present

## 2023-02-18 LAB — RAD ONC ARIA SESSION SUMMARY
Course Elapsed Days: 7
Plan Fractions Treated to Date: 6
Plan Prescribed Dose Per Fraction: 2.66 Gy
Plan Total Fractions Prescribed: 16
Plan Total Prescribed Dose: 42.56 Gy
Reference Point Dosage Given to Date: 15.96 Gy
Reference Point Session Dosage Given: 2.66 Gy
Session Number: 6

## 2023-02-19 ENCOUNTER — Other Ambulatory Visit: Payer: Self-pay

## 2023-02-19 ENCOUNTER — Ambulatory Visit: Payer: 59

## 2023-02-19 ENCOUNTER — Ambulatory Visit
Admission: RE | Admit: 2023-02-19 | Discharge: 2023-02-19 | Disposition: A | Discharge status: Home / Self Care | Payer: 59 | Source: Ambulatory Visit | Attending: Radiation Oncology | Admitting: Radiation Oncology

## 2023-02-19 DIAGNOSIS — Z510 Encounter for antineoplastic radiation therapy: Secondary | ICD-10-CM | POA: Diagnosis not present

## 2023-02-19 DIAGNOSIS — C50411 Malignant neoplasm of upper-outer quadrant of right female breast: Secondary | ICD-10-CM | POA: Diagnosis not present

## 2023-02-19 DIAGNOSIS — Z170 Estrogen receptor positive status [ER+]: Secondary | ICD-10-CM | POA: Diagnosis not present

## 2023-02-19 LAB — RAD ONC ARIA SESSION SUMMARY
Course Elapsed Days: 8
Plan Fractions Treated to Date: 7
Plan Prescribed Dose Per Fraction: 2.66 Gy
Plan Total Fractions Prescribed: 16
Plan Total Prescribed Dose: 42.56 Gy
Reference Point Dosage Given to Date: 18.62 Gy
Reference Point Session Dosage Given: 2.66 Gy
Session Number: 7

## 2023-02-20 ENCOUNTER — Ambulatory Visit
Admission: RE | Admit: 2023-02-20 | Discharge: 2023-02-20 | Disposition: A | Discharge status: Home / Self Care | Payer: 59 | Source: Ambulatory Visit | Attending: Radiation Oncology | Admitting: Radiation Oncology

## 2023-02-20 ENCOUNTER — Ambulatory Visit: Payer: 59

## 2023-02-20 ENCOUNTER — Other Ambulatory Visit: Payer: Self-pay

## 2023-02-20 DIAGNOSIS — Z510 Encounter for antineoplastic radiation therapy: Secondary | ICD-10-CM | POA: Diagnosis not present

## 2023-02-20 DIAGNOSIS — Z170 Estrogen receptor positive status [ER+]: Secondary | ICD-10-CM | POA: Diagnosis not present

## 2023-02-20 DIAGNOSIS — C50411 Malignant neoplasm of upper-outer quadrant of right female breast: Secondary | ICD-10-CM | POA: Diagnosis not present

## 2023-02-20 LAB — RAD ONC ARIA SESSION SUMMARY
Course Elapsed Days: 9
Plan Fractions Treated to Date: 8
Plan Prescribed Dose Per Fraction: 2.66 Gy
Plan Total Fractions Prescribed: 16
Plan Total Prescribed Dose: 42.56 Gy
Reference Point Dosage Given to Date: 21.28 Gy
Reference Point Session Dosage Given: 2.66 Gy
Session Number: 8

## 2023-02-23 ENCOUNTER — Other Ambulatory Visit: Payer: Self-pay

## 2023-02-23 ENCOUNTER — Ambulatory Visit
Admission: RE | Admit: 2023-02-23 | Discharge: 2023-02-23 | Disposition: A | Discharge status: Home / Self Care | Payer: 59 | Source: Ambulatory Visit | Attending: Radiation Oncology | Admitting: Radiation Oncology

## 2023-02-23 ENCOUNTER — Ambulatory Visit: Payer: 59

## 2023-02-23 DIAGNOSIS — Z510 Encounter for antineoplastic radiation therapy: Secondary | ICD-10-CM | POA: Diagnosis not present

## 2023-02-23 LAB — RAD ONC ARIA SESSION SUMMARY
Course Elapsed Days: 12
Plan Fractions Treated to Date: 9
Plan Prescribed Dose Per Fraction: 2.66 Gy
Plan Total Fractions Prescribed: 16
Plan Total Prescribed Dose: 42.56 Gy
Reference Point Dosage Given to Date: 23.94 Gy
Reference Point Session Dosage Given: 2.66 Gy
Session Number: 9

## 2023-02-24 ENCOUNTER — Ambulatory Visit: Payer: 59

## 2023-02-24 ENCOUNTER — Ambulatory Visit
Admission: RE | Admit: 2023-02-24 | Discharge: 2023-02-24 | Disposition: A | Discharge status: Home / Self Care | Payer: 59 | Source: Ambulatory Visit | Attending: Radiation Oncology | Admitting: Radiation Oncology

## 2023-02-24 ENCOUNTER — Other Ambulatory Visit: Payer: Self-pay

## 2023-02-24 DIAGNOSIS — Z170 Estrogen receptor positive status [ER+]: Secondary | ICD-10-CM | POA: Diagnosis not present

## 2023-02-24 DIAGNOSIS — Z510 Encounter for antineoplastic radiation therapy: Secondary | ICD-10-CM | POA: Diagnosis not present

## 2023-02-24 DIAGNOSIS — C50411 Malignant neoplasm of upper-outer quadrant of right female breast: Secondary | ICD-10-CM | POA: Diagnosis not present

## 2023-02-24 LAB — RAD ONC ARIA SESSION SUMMARY
Course Elapsed Days: 13
Plan Fractions Treated to Date: 10
Plan Prescribed Dose Per Fraction: 2.66 Gy
Plan Total Fractions Prescribed: 16
Plan Total Prescribed Dose: 42.56 Gy
Reference Point Dosage Given to Date: 26.6 Gy
Reference Point Session Dosage Given: 2.66 Gy
Session Number: 10

## 2023-02-25 ENCOUNTER — Other Ambulatory Visit: Payer: Self-pay

## 2023-02-25 ENCOUNTER — Ambulatory Visit: Payer: 59

## 2023-02-25 ENCOUNTER — Ambulatory Visit
Admission: RE | Admit: 2023-02-25 | Discharge: 2023-02-25 | Disposition: A | Discharge status: Home / Self Care | Payer: 59 | Source: Ambulatory Visit

## 2023-02-25 DIAGNOSIS — Z170 Estrogen receptor positive status [ER+]: Secondary | ICD-10-CM | POA: Diagnosis not present

## 2023-02-25 DIAGNOSIS — C50411 Malignant neoplasm of upper-outer quadrant of right female breast: Secondary | ICD-10-CM | POA: Diagnosis not present

## 2023-02-25 DIAGNOSIS — Z510 Encounter for antineoplastic radiation therapy: Secondary | ICD-10-CM | POA: Diagnosis not present

## 2023-02-25 LAB — RAD ONC ARIA SESSION SUMMARY
Course Elapsed Days: 14
Plan Fractions Treated to Date: 11
Plan Prescribed Dose Per Fraction: 2.66 Gy
Plan Total Fractions Prescribed: 16
Plan Total Prescribed Dose: 42.56 Gy
Reference Point Dosage Given to Date: 29.26 Gy
Reference Point Session Dosage Given: 2.66 Gy
Session Number: 11

## 2023-02-26 ENCOUNTER — Ambulatory Visit
Admission: RE | Admit: 2023-02-26 | Discharge: 2023-02-26 | Disposition: A | Discharge status: Home / Self Care | Payer: 59 | Source: Ambulatory Visit

## 2023-02-26 ENCOUNTER — Other Ambulatory Visit: Payer: Self-pay

## 2023-02-26 ENCOUNTER — Ambulatory Visit (INDEPENDENT_AMBULATORY_CARE_PROVIDER_SITE_OTHER): Payer: 59 | Admitting: Allergy

## 2023-02-26 ENCOUNTER — Encounter: Payer: Self-pay | Admitting: Allergy

## 2023-02-26 ENCOUNTER — Ambulatory Visit: Payer: 59

## 2023-02-26 VITALS — BP 120/62 | HR 72 | Temp 97.6°F | Resp 16 | Ht 67.5 in | Wt 177.3 lb

## 2023-02-26 DIAGNOSIS — J302 Other seasonal allergic rhinitis: Secondary | ICD-10-CM

## 2023-02-26 DIAGNOSIS — J3089 Other allergic rhinitis: Secondary | ICD-10-CM | POA: Diagnosis not present

## 2023-02-26 DIAGNOSIS — C50411 Malignant neoplasm of upper-outer quadrant of right female breast: Secondary | ICD-10-CM | POA: Diagnosis not present

## 2023-02-26 DIAGNOSIS — L299 Pruritus, unspecified: Secondary | ICD-10-CM

## 2023-02-26 DIAGNOSIS — Z510 Encounter for antineoplastic radiation therapy: Secondary | ICD-10-CM | POA: Diagnosis not present

## 2023-02-26 DIAGNOSIS — Z170 Estrogen receptor positive status [ER+]: Secondary | ICD-10-CM | POA: Diagnosis not present

## 2023-02-26 LAB — RAD ONC ARIA SESSION SUMMARY
Course Elapsed Days: 15
Plan Fractions Treated to Date: 12
Plan Prescribed Dose Per Fraction: 2.66 Gy
Plan Total Fractions Prescribed: 16
Plan Total Prescribed Dose: 42.56 Gy
Reference Point Dosage Given to Date: 31.92 Gy
Reference Point Session Dosage Given: 2.66 Gy
Session Number: 12

## 2023-02-26 MED ORDER — EPINEPHRINE 0.3 MG/0.3ML IJ SOAJ: 0.3000 mg | INTRAMUSCULAR | 1 refills | Status: DC | PRN

## 2023-02-26 NOTE — Progress Notes (Signed)
Follow-up Note  RE: Samantha [name] MRN: [id number] DOB: January 19, 1961 Date of Office Visit: 02/26/2023   History of present illness: Samantha [name] is a 62 y.o. female presenting today for follow-up of allergic rhinitis and pruritus.  She was last seen in the office on 11/09/2020 by her nurse practitioner Amada Jupiter.  Discussed the use of AI scribe software for clinical note transcription with the patient, who gave verbal consent to proceed.  Since her last visit she was diagnosed with cancer of the left breast, is currently undergoing radiation therapy, which is expected to be completed by November 26th.  She had a lumpectomy and chemo previously.  During this timeframe.  She did stop essentially all of her allergy medications and immunotherapy regimen.  Her last immunotherapy injection was in the summer of 2023. The patient expressed interest in resuming the allergy shots after she is completed with radiation, as she has noticed a decrease in her ability to smell and more nasal congestion. She had contracted COVID-19 in 2020, which initially diminished her sense of smell, but it had partially recovered and now she is noting a decrease in smell.  She states at times she can feel a sensation of pressure across the cheeks and nasal bridge.  Currently not taking any antihistamines or any nasal sprays.  She does use nasal saline periodically.  She was taking Allegra before however does note that antihistamines can have some drying effect as she does not drink a lot of water in general and she is trying her best to increase her water intake. Of note she has not tolerated previous use Xhance, Xyzal or Singulair these medications are avoided She still follows with her dermatologist and currently recommended to use betamethasone as needed that she does she will on occasion for areas on her foot and her hands.    Review of systems: 10pt ROS negative unless noted above in HPI  All other systems negative unless noted  above in HPI  Past medical/social/surgical/family history have been reviewed and are unchanged unless specifically indicated below.  No changes  Medication List: Current Outpatient Medications  Medication Sig Dispense Refill   betamethasone dipropionate 0.05 % cream Apply 1 Application topically 2 (two) times daily.     Calcium Carb-Cholecalciferol (CALCIUM-VITAMIN D3) 250-125 MG-UNIT TABS Take 1 tablet by mouth daily.     Cholecalciferol (VITAMIN D3) 50 MCG (2000 UT) TABS Take 2,000 Units by mouth daily.     famotidine (PEPCID) 20 MG tablet Take 1 tablet (20 mg total) by mouth daily. (Patient taking differently: Take 20 mg by mouth daily as needed for heartburn or indigestion.)     Fezolinetant (VEOZAH) 45 MG TABS Take 45 mg by mouth daily.     Levothyroxine Sodium 88 MCG CAPS Take 88 mcg by mouth See admin instructions. Take 88 mcg by mouth for two days and skip a day then repeat cycle.  10   methocarbamol (ROBAXIN) 500 MG tablet Take 1 tablet (500 mg total) by mouth 4 (four) times daily as needed for muscle spasms. 60 tablet 0   nystatin-triamcinolone (MYCOLOG II) cream Apply 1 Application topically 2 (two) times daily as needed (irritation).     sertraline (ZOLOFT) 50 MG tablet Take 50 mg by mouth daily.     tacrolimus (PROTOPIC) 0.03 % ointment Apply 1 Application topically 2 (two) times daily.     TIROSINT 100 MCG CAPS Take 100 mcg by mouth every 3 (three) days.     No current  facility-administered medications for this visit.     Known medication allergies: Allergies  Allergen Reactions   Nortriptyline Other (See Comments)    depression   Singulair [Montelukast Sodium] Other (See Comments)    Depressed mood   Sulfonamide Derivatives Nausea And Vomiting   Amitriptyline Hcl     Depression mood   Esomeprazole Hives   Gabapentin     Hair Loss   Lexapro [Escitalopram]     Depression mood   Neosporin [Bacitracin-Polymyxin B] Rash     Physical examination: Blood pressure  120/62, pulse 72, temperature 97.6 F (36.4 C), resp. rate 16, height 5' 7.5" (1.715 m), weight 177 lb 4.8 oz (80.4 kg), SpO2 98%.  General: Alert, interactive, in no acute distress. HEENT: PERRLA, TMs pearly gray, turbinates moderately edematous without discharge, post-pharynx non erythematous. Neck: Supple without lymphadenopathy. Lungs: Clear to auscultation without wheezing, rhonchi or rales. {no increased work of breathing. CV: Normal S1, S2 without murmurs. Abdomen: Nondistended, nontender. Skin: Warm and dry, without lesions or rashes. Extremities:  No clubbing, cyanosis or edema. Neuro:   Grossly intact.  Diagnositics/Labs: None today  Assessment and plan:   Allergic rhinitis - May use saline nasal gel or spray to help with nasal dryness - Recommend performing nasal saline rinse about weekly - Continue avoidance measures for mugwort (weed pollen), oak (tree pollen), molds.   - Continue allergen immunotherapy at maintenance dosing and have access to epinephrine autoinjector device. - Can use Allegra tab or half tab daily as needed for allergy symptom control - Will restart allergy shots in December.  Schedule a "new start" appointment to restart shots and will advance relatively quickly - Bring epipen device on your injections and take your antihistamine on injection days  Itching  - avoidance measures as above  - continue use of betamethasone as directed by dermatology  Follow-up 12 months or sooner if needed  I appreciate the opportunity to take part in Datha's care. Please do not hesitate to contact me with questions.  Sincerely,   Margo Aye, MD Allergy/Immunology [facility]

## 2023-02-26 NOTE — Patient Instructions (Addendum)
Allergic rhinitis - May use saline nasal gel or spray to help with nasal dryness - Recommend performing nasal saline rinse about weekly - Continue avoidance measures for mugwort (weed pollen), oak (tree pollen), molds.   - Continue allergen immunotherapy at maintenance dosing and have access to epinephrine autoinjector device. - Can use Allegra tab or half tab daily as needed for allergy symptom control - Will restart allergy shots in December.  Schedule a "new start" appointment to restart shots and will advance relatively quickly - Bring epipen device on your injections and take your antihistamine on injection days  Itching  - avoidance measures as above  - continue use of betamethasone as directed by dermatology  Follow-up 12 months or sooner if needed

## 2023-02-27 ENCOUNTER — Ambulatory Visit: Payer: 59 | Admitting: Radiation Oncology

## 2023-02-27 ENCOUNTER — Ambulatory Visit
Admission: RE | Admit: 2023-02-27 | Discharge: 2023-02-27 | Disposition: A | Discharge status: Home / Self Care | Payer: 59 | Source: Ambulatory Visit

## 2023-02-27 ENCOUNTER — Ambulatory Visit: Payer: 59

## 2023-02-27 ENCOUNTER — Other Ambulatory Visit: Payer: Self-pay

## 2023-02-27 ENCOUNTER — Ambulatory Visit
Admission: RE | Admit: 2023-02-27 | Discharge: 2023-02-27 | Disposition: A | Discharge status: Home / Self Care | Payer: 59 | Source: Ambulatory Visit | Attending: Radiation Oncology | Admitting: Radiation Oncology

## 2023-02-27 DIAGNOSIS — C50411 Malignant neoplasm of upper-outer quadrant of right female breast: Secondary | ICD-10-CM | POA: Diagnosis not present

## 2023-02-27 DIAGNOSIS — Z510 Encounter for antineoplastic radiation therapy: Secondary | ICD-10-CM | POA: Diagnosis not present

## 2023-02-27 DIAGNOSIS — Z170 Estrogen receptor positive status [ER+]: Secondary | ICD-10-CM | POA: Diagnosis not present

## 2023-02-27 LAB — RAD ONC ARIA SESSION SUMMARY
Course Elapsed Days: 16
Plan Fractions Treated to Date: 13
Plan Prescribed Dose Per Fraction: 2.66 Gy
Plan Total Fractions Prescribed: 16
Plan Total Prescribed Dose: 42.56 Gy
Reference Point Dosage Given to Date: 34.58 Gy
Reference Point Session Dosage Given: 2.66 Gy
Session Number: 13

## 2023-03-02 ENCOUNTER — Other Ambulatory Visit: Payer: Self-pay

## 2023-03-02 ENCOUNTER — Ambulatory Visit
Admission: RE | Admit: 2023-03-02 | Discharge: 2023-03-02 | Disposition: A | Discharge status: Home / Self Care | Payer: 59 | Source: Ambulatory Visit | Attending: Radiation Oncology | Admitting: Radiation Oncology

## 2023-03-02 ENCOUNTER — Ambulatory Visit: Payer: 59

## 2023-03-02 ENCOUNTER — Ambulatory Visit: Payer: 59 | Admitting: Radiation Oncology

## 2023-03-02 DIAGNOSIS — Z510 Encounter for antineoplastic radiation therapy: Secondary | ICD-10-CM | POA: Diagnosis not present

## 2023-03-02 LAB — RAD ONC ARIA SESSION SUMMARY
Course Elapsed Days: 19
Plan Fractions Treated to Date: 14
Plan Prescribed Dose Per Fraction: 2.66 Gy
Plan Total Fractions Prescribed: 16
Plan Total Prescribed Dose: 42.56 Gy
Reference Point Dosage Given to Date: 37.24 Gy
Reference Point Session Dosage Given: 2.66 Gy
Session Number: 14

## 2023-03-03 ENCOUNTER — Other Ambulatory Visit: Payer: Self-pay

## 2023-03-03 ENCOUNTER — Ambulatory Visit: Payer: 59

## 2023-03-03 ENCOUNTER — Ambulatory Visit
Admission: RE | Admit: 2023-03-03 | Discharge: 2023-03-03 | Disposition: A | Discharge status: Home / Self Care | Payer: 59 | Source: Ambulatory Visit

## 2023-03-03 DIAGNOSIS — Z170 Estrogen receptor positive status [ER+]: Secondary | ICD-10-CM | POA: Diagnosis not present

## 2023-03-03 DIAGNOSIS — J301 Allergic rhinitis due to pollen: Secondary | ICD-10-CM | POA: Diagnosis not present

## 2023-03-03 DIAGNOSIS — Z510 Encounter for antineoplastic radiation therapy: Secondary | ICD-10-CM | POA: Diagnosis not present

## 2023-03-03 DIAGNOSIS — C50411 Malignant neoplasm of upper-outer quadrant of right female breast: Secondary | ICD-10-CM | POA: Diagnosis not present

## 2023-03-03 LAB — RAD ONC ARIA SESSION SUMMARY
Course Elapsed Days: 20
Plan Fractions Treated to Date: 15
Plan Prescribed Dose Per Fraction: 2.66 Gy
Plan Total Fractions Prescribed: 16
Plan Total Prescribed Dose: 42.56 Gy
Reference Point Dosage Given to Date: 39.9 Gy
Reference Point Session Dosage Given: 2.66 Gy
Session Number: 15

## 2023-03-03 NOTE — Progress Notes (Signed)
VIALS EXP 03-02-24

## 2023-03-04 ENCOUNTER — Ambulatory Visit
Admission: RE | Admit: 2023-03-04 | Discharge: 2023-03-04 | Disposition: A | Discharge status: Home / Self Care | Payer: 59 | Source: Ambulatory Visit | Attending: Radiation Oncology | Admitting: Radiation Oncology

## 2023-03-04 ENCOUNTER — Ambulatory Visit: Payer: 59

## 2023-03-04 ENCOUNTER — Other Ambulatory Visit: Payer: Self-pay

## 2023-03-04 DIAGNOSIS — Z510 Encounter for antineoplastic radiation therapy: Secondary | ICD-10-CM | POA: Diagnosis not present

## 2023-03-04 DIAGNOSIS — J302 Other seasonal allergic rhinitis: Secondary | ICD-10-CM | POA: Diagnosis not present

## 2023-03-04 DIAGNOSIS — Z170 Estrogen receptor positive status [ER+]: Secondary | ICD-10-CM | POA: Diagnosis not present

## 2023-03-04 DIAGNOSIS — C50411 Malignant neoplasm of upper-outer quadrant of right female breast: Secondary | ICD-10-CM | POA: Diagnosis not present

## 2023-03-04 LAB — RAD ONC ARIA SESSION SUMMARY
Course Elapsed Days: 21
Plan Fractions Treated to Date: 16
Plan Prescribed Dose Per Fraction: 2.66 Gy
Plan Total Fractions Prescribed: 16
Plan Total Prescribed Dose: 42.56 Gy
Reference Point Dosage Given to Date: 42.56 Gy
Reference Point Session Dosage Given: 2.66 Gy
Session Number: 16

## 2023-03-05 ENCOUNTER — Ambulatory Visit
Admission: RE | Admit: 2023-03-05 | Discharge: 2023-03-05 | Disposition: A | Discharge status: Home / Self Care | Payer: 59 | Source: Ambulatory Visit | Attending: Radiation Oncology | Admitting: Radiation Oncology

## 2023-03-05 ENCOUNTER — Ambulatory Visit: Payer: 59

## 2023-03-05 ENCOUNTER — Other Ambulatory Visit: Payer: Self-pay

## 2023-03-05 DIAGNOSIS — Z170 Estrogen receptor positive status [ER+]: Secondary | ICD-10-CM | POA: Diagnosis not present

## 2023-03-05 DIAGNOSIS — C50411 Malignant neoplasm of upper-outer quadrant of right female breast: Secondary | ICD-10-CM | POA: Diagnosis not present

## 2023-03-05 DIAGNOSIS — Z510 Encounter for antineoplastic radiation therapy: Secondary | ICD-10-CM | POA: Diagnosis not present

## 2023-03-05 LAB — RAD ONC ARIA SESSION SUMMARY
Course Elapsed Days: 22
Plan Fractions Treated to Date: 1
Plan Prescribed Dose Per Fraction: 2 Gy
Plan Total Fractions Prescribed: 4
Plan Total Prescribed Dose: 8 Gy
Reference Point Dosage Given to Date: 2 Gy
Reference Point Session Dosage Given: 2 Gy
Session Number: 17

## 2023-03-06 ENCOUNTER — Other Ambulatory Visit: Payer: Self-pay

## 2023-03-06 ENCOUNTER — Ambulatory Visit: Payer: 59

## 2023-03-06 ENCOUNTER — Ambulatory Visit
Admission: RE | Admit: 2023-03-06 | Discharge: 2023-03-06 | Disposition: A | Discharge status: Home / Self Care | Payer: 59 | Source: Ambulatory Visit | Attending: Radiation Oncology | Admitting: Radiation Oncology

## 2023-03-06 ENCOUNTER — Ambulatory Visit
Admission: RE | Admit: 2023-03-06 | Discharge: 2023-03-06 | Disposition: A | Discharge status: Home / Self Care | Payer: 59 | Source: Ambulatory Visit

## 2023-03-06 DIAGNOSIS — Z510 Encounter for antineoplastic radiation therapy: Secondary | ICD-10-CM | POA: Diagnosis not present

## 2023-03-06 LAB — RAD ONC ARIA SESSION SUMMARY
Course Elapsed Days: 23
Plan Fractions Treated to Date: 2
Plan Prescribed Dose Per Fraction: 2 Gy
Plan Total Fractions Prescribed: 4
Plan Total Prescribed Dose: 8 Gy
Reference Point Dosage Given to Date: 4 Gy
Reference Point Session Dosage Given: 2 Gy
Session Number: 18

## 2023-03-09 ENCOUNTER — Ambulatory Visit: Payer: 59

## 2023-03-09 ENCOUNTER — Ambulatory Visit
Admission: RE | Admit: 2023-03-09 | Discharge: 2023-03-09 | Disposition: A | Discharge status: Home / Self Care | Payer: 59 | Source: Ambulatory Visit

## 2023-03-09 ENCOUNTER — Other Ambulatory Visit: Payer: Self-pay

## 2023-03-09 DIAGNOSIS — Z510 Encounter for antineoplastic radiation therapy: Secondary | ICD-10-CM | POA: Diagnosis not present

## 2023-03-09 LAB — RAD ONC ARIA SESSION SUMMARY
Course Elapsed Days: 26
Plan Fractions Treated to Date: 3
Plan Prescribed Dose Per Fraction: 2 Gy
Plan Total Fractions Prescribed: 4
Plan Total Prescribed Dose: 8 Gy
Reference Point Dosage Given to Date: 6 Gy
Reference Point Session Dosage Given: 2 Gy
Session Number: 19

## 2023-03-09 NOTE — Assessment & Plan Note (Signed)
-  diagnosed in 12/05/2022, ER-/PR-, g2 -s/p left lumpectomy by Dr. Donell Beers on January 01, 2023.  Surgical path showed intermediate grade DCIS with necrosis, margins were negative. -She has completed adjuvant radiation -No role for adjuvant antiestrogen therapy -Continue breast cancer surveillance

## 2023-03-10 ENCOUNTER — Other Ambulatory Visit: Payer: Self-pay

## 2023-03-10 ENCOUNTER — Ambulatory Visit: Payer: 59

## 2023-03-10 ENCOUNTER — Inpatient Hospital Stay: Payer: 59 | Attending: Hematology | Admitting: Hematology

## 2023-03-10 ENCOUNTER — Encounter: Payer: Self-pay | Admitting: Hematology

## 2023-03-10 ENCOUNTER — Ambulatory Visit
Admission: RE | Admit: 2023-03-10 | Discharge: 2023-03-10 | Disposition: A | Discharge status: Home / Self Care | Payer: 59 | Source: Ambulatory Visit

## 2023-03-10 VITALS — BP 128/74 | HR 82 | Temp 98.2°F | Resp 17 | Wt 179.3 lb

## 2023-03-10 DIAGNOSIS — Z170 Estrogen receptor positive status [ER+]: Secondary | ICD-10-CM | POA: Diagnosis not present

## 2023-03-10 DIAGNOSIS — Z923 Personal history of irradiation: Secondary | ICD-10-CM | POA: Insufficient documentation

## 2023-03-10 DIAGNOSIS — C50411 Malignant neoplasm of upper-outer quadrant of right female breast: Secondary | ICD-10-CM | POA: Diagnosis not present

## 2023-03-10 DIAGNOSIS — Z79621 Long term (current) use of calcineurin inhibitor: Secondary | ICD-10-CM | POA: Insufficient documentation

## 2023-03-10 DIAGNOSIS — F419 Anxiety disorder, unspecified: Secondary | ICD-10-CM | POA: Insufficient documentation

## 2023-03-10 DIAGNOSIS — Z1722 Progesterone receptor negative status: Secondary | ICD-10-CM | POA: Insufficient documentation

## 2023-03-10 DIAGNOSIS — R5383 Other fatigue: Secondary | ICD-10-CM | POA: Insufficient documentation

## 2023-03-10 DIAGNOSIS — D0512 Intraductal carcinoma in situ of left breast: Secondary | ICD-10-CM | POA: Diagnosis not present

## 2023-03-10 DIAGNOSIS — Z882 Allergy status to sulfonamides status: Secondary | ICD-10-CM | POA: Insufficient documentation

## 2023-03-10 DIAGNOSIS — Z9089 Acquired absence of other organs: Secondary | ICD-10-CM | POA: Insufficient documentation

## 2023-03-10 DIAGNOSIS — Z510 Encounter for antineoplastic radiation therapy: Secondary | ICD-10-CM | POA: Insufficient documentation

## 2023-03-10 DIAGNOSIS — N6331 Unspecified lump in axillary tail of the right breast: Secondary | ICD-10-CM | POA: Insufficient documentation

## 2023-03-10 DIAGNOSIS — Z171 Estrogen receptor negative status [ER-]: Secondary | ICD-10-CM | POA: Insufficient documentation

## 2023-03-10 DIAGNOSIS — Z888 Allergy status to other drugs, medicaments and biological substances status: Secondary | ICD-10-CM | POA: Insufficient documentation

## 2023-03-10 DIAGNOSIS — Z79899 Other long term (current) drug therapy: Secondary | ICD-10-CM | POA: Insufficient documentation

## 2023-03-10 LAB — RAD ONC ARIA SESSION SUMMARY
Course Elapsed Days: 27
Plan Fractions Treated to Date: 4
Plan Prescribed Dose Per Fraction: 2 Gy
Plan Total Fractions Prescribed: 4
Plan Total Prescribed Dose: 8 Gy
Reference Point Dosage Given to Date: 8 Gy
Reference Point Session Dosage Given: 2 Gy
Session Number: 20

## 2023-03-10 NOTE — Assessment & Plan Note (Signed)
IDC and DCIS, Stage IA, pT1b, cN0, triple negative, Grade 3 -found on screening mammogram. S/p lumpectomy on 01/08/22 with Dr. Donell Beers, path showed: 8 mm invasive and in situ ductal carcinoma with negative margins. Repeat prognostic panel confirmed triple negative disease.  -lymph node biopsies 01/28/22 showed negative nodes (0/2). Port placed at time of procedure. Postoperative course complicated by pneumothorax, which has resolved. -she began adjuvant TC on 02/20/22, and completed planned 4 cycles on 04/24/2022. -she completed adjuvant RT on 07/09/2022  -we discussed cancer surveillance and what to watch at home

## 2023-03-10 NOTE — Progress Notes (Signed)
[facility]   [phone] Fax:([phone]   Clinic Follow up Note   Patient Care Team: Rodrigo Ran, MD as PCP - General (Internal Medicine) Pershing Proud, RN as Oncology Nurse Navigator Donnelly Angelica, RN as Oncology Nurse Navigator [name], MD as Consulting Physician (General Surgery) Malachy Mood, MD as Consulting Physician (Hematology) Dorothy Puffer, MD as Consulting Physician (Radiation Oncology) Felicity Coyer, MD as Consulting Physician (Endocrinology) Enid Baas, MD as Consulting Physician (Sports Medicine) Zelphia Cairo, MD as Consulting Physician (Obstetrics and Gynecology) Pollyann Samples, NP as Nurse Practitioner (Nurse Practitioner)  Date of Service:  03/10/2023  CHIEF COMPLAINT: f/u of breast cancer  CURRENT THERAPY:  Adjuvant radiation  Oncology History   Ductal carcinoma in situ (DCIS) of left breast -diagnosed in 12/05/2022, ER-/PR-, g2 -s/p left lumpectomy by Dr. Donell Beers on January 01, 2023.  Surgical path showed intermediate grade DCIS with necrosis, margins were negative. -She has completed adjuvant radiation -No role for adjuvant antiestrogen therapy -Continue breast cancer surveillance  Malignant neoplasm of upper-outer quadrant of right breast in female, estrogen receptor negative (HCC) IDC and DCIS, Stage IA, pT1b, cN0, triple negative, Grade 3 -found on screening mammogram. S/p lumpectomy on 01/08/22 with Dr. Donell Beers, path showed: 8 mm invasive and in situ ductal carcinoma with negative margins. Repeat prognostic panel confirmed triple negative disease.  -lymph node biopsies 01/28/22 showed negative nodes (0/2). Port placed at time of procedure. Postoperative course complicated by pneumothorax, which has resolved. -she began adjuvant TC on 02/20/22, and completed planned 4 cycles on 04/24/2022. -she completed adjuvant RT on 07/09/2022  -we discussed cancer surveillance and what to watch at home     Assessment  and Plan    Ductal Carcinoma In Situ (DCIS)   62 year old with DCIS undergoing radiation therapy. Reports sensitivity and itching at the radiation site, and fatigue. Post-lumpectomy in September with negative margins and no invasive cancer. ERPR negative, thus no benefit from anti-estrogen therapy. Discussed genetic testing due to bilateral breast cancer and triple-negative status, potential BRCA1/2 mutations, and implications for prophylactic mastectomy or oophorectomy. Addressed impact on life insurance policies and importance of obtaining insurance beforehand. Emphasized early detection to minimize aggressive treatments.   - Continue radiation therapy, she will complete today. - Order annual diagnostic mammogram for the next two years   - Order annual MRI starting in 2026   - Discuss genetic testing with a genetic counselor if she decides to proceed   - Follow up every six months until 2028    General Health Maintenance   Emphasized continued screening and monitoring due to bilateral breast cancer. Discussed potential anxiety and discomfort associated with intensive screening, including biopsies and MRI procedures.   - Order annual diagnostic mammogram for the next two years   - Order annual MRI starting in 2026   - Schedule follow-up appointments every six months until 2028    Plan -She will complete adjuvant radiation today -Continue breast cancer surveillance, she is open to annual mammogram and breast MRI for screening - Schedule follow-up appointments every six months with oncology, next visit with NP Lacie in July 2025 - Alternate follow-up appointments with Dr. Donell Beers every three months.         SUMMARY OF ONCOLOGIC HISTORY: Oncology History Overview Note   Cancer Staging  Malignant neoplasm of upper-outer quadrant of right breast in female, estrogen receptor negative Staging form: Breast, AJCC 8th Edition - Clinical stage from 12/23/2021: cT1b, cM0, G3, ER-, PR-, HER2- -  Unsigned Stage prefix: Initial diagnosis Histologic grading system: 3 grade system - Pathologic stage from 01/08/2022: Stage IB (pT1b, pN0, cM0, G3, ER-, PR-, HER2-) - Signed by Malachy Mood, MD on 01/24/2022 Stage prefix: Initial diagnosis Histologic grading system: 3 grade system Residual tumor (R): R0 - None     Malignant neoplasm of upper-outer quadrant of right breast in female, estrogen receptor negative (HCC)  12/06/2021 Mammogram   CLINICAL DATA:  Patient returns today to evaluate RIGHT breast calcifications identified on a recent screening mammogram.   EXAM: DIGITAL DIAGNOSTIC UNILATERAL RIGHT MAMMOGRAM  IMPRESSION: Grouped coarse heterogeneous calcifications within the upper-outer quadrant of the RIGHT breast, measuring 6 mm extent. These may be fibroadenomatous calcifications. Stereotactic biopsy is recommended to exclude malignancy.   12/18/2021 Initial Biopsy   Diagnosis Breast, right, needle core biopsy, upper outer quadrant, x clip - DUCTAL CARCINOMA IN SITU, SOLID AND CRIBRIFORM TYPE WITH COMEDONECROSIS AND ASSOCIATED CALCIFICATIONS, NUCLEAR GRADE 3 OF 3 - FOCAL MICROINVASION IS PRESENT (LESS THAN 1 MM) WITH EVIDENCE OF LYMPHOVASCULAR INVASION - NECROSIS: PRESENT - CALCIFICATIONS: PRESENT - DCIS LENGTH: 7 MM IN GREATEST LINEAR DIMENSION ON FRAGMENTED CORES  PROGNOSTIC INDICATORS Results: IMMUNOHISTOCHEMICAL AND MORPHOMETRIC ANALYSIS PERFORMED MANUALLY The tumor cells are NEGATIVE for Her2 (1+). Estrogen Receptor: 0%, NEGATIVE Progesterone Receptor: 0%, NEGATIVE Proliferation Marker Ki67: 60%   12/23/2021 Initial Diagnosis   Malignant neoplasm of upper-outer quadrant of right breast in female, estrogen receptor negative (HCC)   12/23/2021 Imaging   EXAM: ULTRASOUND OF THE RIGHT AXILLA  IMPRESSION: No abnormal appearing RIGHT axillary lymph nodes.   01/08/2022 Cancer Staging   Staging form: Breast, AJCC 8th Edition - Pathologic stage from 01/08/2022: Stage IB  (pT1b, pN0, cM0, G3, ER-, PR-, HER2-) - Signed by Malachy Mood, MD on 01/24/2022 Stage prefix: Initial diagnosis Histologic grading system: 3 grade system Residual tumor (R): R0 - None   02/20/2022 - 04/24/2022 Chemotherapy   Patient is on Treatment Plan : BREAST TC q21d     10/06/2022 Survivorship   SCP delivered by [name], NP      Discussed the use of AI scribe software for clinical note transcription with the patient, who gave verbal consent to proceed.  History of Present Illness   The patient is a 62 year old female with a history of ductal carcinoma in situ (DCIS) who is currently undergoing radiation treatment. She reports that her skin has become red and rashy due to the radiation, but she experienced similar symptoms during her last round of treatment. The radiation has also caused her to feel fatigued, but she is still able to function and carry out her daily activities. She has no other health complaints at this time.  The patient underwent a lumpectomy in September, which showed DCIS and no invasive cancer. The margins were negative. The patient also had atypical ductal hyperplasia, which is benign. The patient's cancer was ERPR negative, so she is not expected to benefit from anti-estrogen therapy.  The patient has been invited to participate in genetic testing research. She has a history of breast cancer on both sides and is considering whether to participate in the testing. She has one son and is considering the potential implications for him if she tests positive for a genetic mutation.         All other systems were reviewed with the patient and are negative.  MEDICAL HISTORY:  Past Medical History:  Diagnosis Date   Allergy    Anxiety 2015   Result of culmination of super  stressful caregiving and deaths of 2 immediate family members.  Overall do pretty well.   Cancer [facility]) 2023   right breast   Depression    denies   GERD (gastroesophageal reflux disease)     Hypothyroidism    Right carpal tunnel syndrome 09/19/2019   Thyroid disease     SURGICAL HISTORY: Past Surgical History:  Procedure Laterality Date   BREAST BIOPSY Left 12/04/2022   MM LT BREAST BX W LOC DEV 1ST LESION IMAGE BX SPEC STEREO GUIDE 12/04/2022 [facility]   BREAST BIOPSY  12/30/2022   MM LT RADIOACTIVE SEED LOC MAMMO GUIDE 12/30/2022 [facility]   BREAST LUMPECTOMY WITH RADIOACTIVE SEED LOCALIZATION Right 01/08/2022   Procedure: RIGHT BREAST LUMPECTOMY WITH RADIOACTIVE SEED LOCALIZATION;  Surgeon: [name], MD;  Location: [facility] OR;  Service: General;  Laterality: Right;   BREAST LUMPECTOMY WITH RADIOACTIVE SEED LOCALIZATION Left 01/01/2023   Procedure: LEFT BREAST SEED LOCALIZED LUMPECTOMY;  Surgeon: [name], MD;  Location: [facility] OR;  Service: General;  Laterality: Left;   PORT-A-CATH REMOVAL Left 05/20/2022   Procedure: REMOVAL PORT-A-CATH;  Surgeon: [name], MD;  Location: [facility];  Service: General;  Laterality: Left;   PORTACATH PLACEMENT Left 01/28/2022   Procedure: INSERTION PORT-A-CATH;  Surgeon: [name], MD;  Location: [facility];  Service: General;  Laterality: Left;   SENTINEL NODE BIOPSY Right 01/28/2022   Procedure: RIGHT SENTINEL LYMPH NODE BIOPSY;  Surgeon: [name], MD;  Location: [facility];  Service: General;  Laterality: Right;   TONSILLECTOMY      I have reviewed the social history and family history with the patient and they are unchanged from previous note.  ALLERGIES:  is allergic to nortriptyline, singulair [montelukast sodium], sulfonamide derivatives, amitriptyline hcl, esomeprazole, gabapentin, lexapro [escitalopram], and neosporin [bacitracin-polymyxin b].  MEDICATIONS:  Current Outpatient Medications  Medication Sig Dispense Refill   betamethasone dipropionate 0.05 % cream Apply 1 Application topically 2 (two) times daily.     Calcium Carb-Cholecalciferol  (CALCIUM-VITAMIN D3) 250-125 MG-UNIT TABS Take 1 tablet by mouth daily.     Cholecalciferol (VITAMIN D3) 50 MCG (2000 UT) TABS Take 2,000 Units by mouth daily.     EPINEPHrine 0.3 mg/0.3 mL IJ SOAJ injection Inject 0.3 mg into the muscle as needed for anaphylaxis. 1 each 1   famotidine (PEPCID) 20 MG tablet Take 1 tablet (20 mg total) by mouth daily. (Patient taking differently: Take 20 mg by mouth daily as needed for heartburn or indigestion.)     Fezolinetant (VEOZAH) 45 MG TABS Take 45 mg by mouth daily.     Levothyroxine Sodium 88 MCG CAPS Take 88 mcg by mouth See admin instructions. Take 88 mcg by mouth for two days and skip a day then repeat cycle.  10   methocarbamol (ROBAXIN) 500 MG tablet Take 1 tablet (500 mg total) by mouth 4 (four) times daily as needed for muscle spasms. 60 tablet 0   nystatin-triamcinolone (MYCOLOG II) cream Apply 1 Application topically 2 (two) times daily as needed (irritation).     sertraline (ZOLOFT) 50 MG tablet Take 50 mg by mouth daily.     tacrolimus (PROTOPIC) 0.03 % ointment Apply 1 Application topically 2 (two) times daily.     TIROSINT 100 MCG CAPS Take 100 mcg by mouth every 3 (three) days.     No current facility-administered medications for this visit.    PHYSICAL EXAMINATION: ECOG PERFORMANCE STATUS: 1 - Symptomatic but completely ambulatory  Vitals:  03/10/23 1449  BP: 128/74  Pulse: 82  Resp: 17  Temp: 98.2 F (36.8 C)  SpO2: 96%   Wt Readings from Last 3 Encounters:  03/10/23 179 lb 4.8 oz (81.3 kg)  02/26/23 177 lb 4.8 oz (80.4 kg)  01/28/23 182 lb 8 oz (82.8 kg)     GENERAL:alert, no distress and comfortable SKIN: skin color, texture, turgor are normal, no rashes or significant lesions EYES: normal, Conjunctiva are pink and non-injected, sclera clear Musculoskeletal:no cyanosis of digits and no clubbing  NEURO: alert & oriented x 3 with fluent speech, no focal motor/sensory deficits   LABORATORY DATA:  I have reviewed the  data as listed    Latest Ref Rng & Units 12/09/2022   12:02 PM 10/06/2022   11:04 AM 07/16/2022   11:24 AM  CBC  WBC 4.0 - 10.5 K/uL 8.4  5.2  4.7   Hemoglobin 12.0 - 15.0 g/dL 95.2  84.1  32.4   Hematocrit 36.0 - 46.0 % 43.4  41.6  43.4   Platelets 150 - 400 K/uL 270  246  239         Latest Ref Rng & Units 12/09/2022   12:02 PM 10/06/2022   11:04 AM 07/16/2022   11:24 AM  CMP  Glucose 70 - 99 mg/dL 401  027  253   BUN 8 - 23 mg/dL 14  15  14    Creatinine 0.44 - 1.00 mg/dL 6.64  4.03  4.74   Sodium 135 - 145 mmol/L 141  139  139   Potassium 3.5 - 5.1 mmol/L 3.9  3.9  3.9   Chloride 98 - 111 mmol/L 105  105  105   CO2 22 - 32 mmol/L 28  27  28    Calcium 8.9 - 10.3 mg/dL 9.9  9.5  9.6   Total Protein 6.5 - 8.1 g/dL 7.7  7.0  7.5   Total Bilirubin 0.3 - 1.2 mg/dL 0.4  0.5  0.4   Alkaline Phos 38 - 126 U/L 113  106  107   AST 15 - 41 U/L 21  22  20    ALT 0 - 44 U/L 23  23  18        RADIOGRAPHIC STUDIES: I have personally reviewed the radiological images as listed and agreed with the findings in the report. No results found.    No orders of the defined types were placed in this encounter.  All questions were answered. The patient knows to call the clinic with any problems, questions or concerns. No barriers to learning was detected. The total time spent in the appointment was 25 minutes.     Malachy Mood, MD 03/10/2023

## 2023-03-11 NOTE — Radiation Completion Notes (Addendum)
  Radiation Oncology         [phone] ________________________________  Name: Samantha [name] MRN: [id number]  Date of Service: 03/10/2023  DOB: 02/22/1961  End of Treatment Note  Diagnosis:  Intermediate grade ER/PR negative DCIS of the left breast.   Intent: Curative     ==========DELIVERED PLANS==========  First Treatment Date: 2023-02-11 - Last Treatment Date: 2023-03-10   Plan Name: Breast_L_BH Site: Breast, Left Technique: 3D Mode: Photon Dose Per Fraction: 2.66 Gy Prescribed Dose (Delivered / Prescribed): 42.56 Gy / 42.56 Gy Prescribed Fxs (Delivered / Prescribed): 16 / 16   Plan Name: Brst_L_Bst_BH Site: Breast, Left Technique: 3D Mode: Photon Dose Per Fraction: 2 Gy Prescribed Dose (Delivered / Prescribed): 8 Gy / 8 Gy Prescribed Fxs (Delivered / Prescribed): 4 / 4     ==========ON TREATMENT VISIT DATES========== 2023-02-13, 2023-02-20, 2023-02-27, 2023-03-06   See weekly On Treatment Notes in Epic for details.The patient tolerated radiation. She developed fatigue and anticipated skin changes in the treatment field.   The patient will receive a call in about one month from the radiation oncology department. She will continue follow up with Dr. Mosetta Putt as well.      Osker Mason, PAC

## 2023-03-25 DIAGNOSIS — Z5181 Encounter for therapeutic drug level monitoring: Secondary | ICD-10-CM | POA: Diagnosis not present

## 2023-03-26 ENCOUNTER — Ambulatory Visit (INDEPENDENT_AMBULATORY_CARE_PROVIDER_SITE_OTHER): Payer: 59 | Admitting: *Deleted

## 2023-03-26 DIAGNOSIS — J309 Allergic rhinitis, unspecified: Secondary | ICD-10-CM

## 2023-03-26 DIAGNOSIS — C50912 Malignant neoplasm of unspecified site of left female breast: Secondary | ICD-10-CM | POA: Diagnosis not present

## 2023-03-26 DIAGNOSIS — C50411 Malignant neoplasm of upper-outer quadrant of right female breast: Secondary | ICD-10-CM | POA: Diagnosis not present

## 2023-03-26 NOTE — Progress Notes (Signed)
Immunotherapy   Patient Details  Name: Samantha [name] MRN: [id number] Date of Birth: Jul 18, 1960  03/26/2023  Samantha [name] started injections for  Pollen, MOLD Following schedule: B  Frequency:1 time per week Epi-Pen: Yes Consent signed and patient instructions given. Patient restarted allergy injections and received 0.54mL of Pollen in the RUA and 0.50mL MOLD in the LUA. Patient waited 30 minutes in office and did not experience any issues.    [name] 03/26/2023, 10:55 AM

## 2023-03-30 ENCOUNTER — Encounter: Payer: Self-pay | Admitting: Sports Medicine

## 2023-03-31 ENCOUNTER — Other Ambulatory Visit: Payer: Self-pay | Admitting: *Deleted

## 2023-03-31 MED ORDER — METHOCARBAMOL 500 MG PO TABS: 500.0000 mg | ORAL_TABLET | Freq: Four times a day (QID) | ORAL | 0 refills | Status: DC | PRN

## 2023-04-02 ENCOUNTER — Ambulatory Visit (INDEPENDENT_AMBULATORY_CARE_PROVIDER_SITE_OTHER): Payer: 59

## 2023-04-02 DIAGNOSIS — J309 Allergic rhinitis, unspecified: Secondary | ICD-10-CM

## 2023-04-06 ENCOUNTER — Ambulatory Visit
Admission: RE | Admit: 2023-04-06 | Discharge: 2023-04-06 | Disposition: A | Discharge status: Home / Self Care | Payer: 59 | Source: Ambulatory Visit | Attending: Radiation Oncology | Admitting: Radiation Oncology

## 2023-04-06 NOTE — Progress Notes (Signed)
  Radiation Oncology         [phone] ________________________________  Name: Samantha [name] MRN: [id number]  Date of Service: 04/06/2023  DOB: September 28, 1960  Post Treatment Telephone Note  Diagnosis:   Intermediate grade ER/PR negative DCIS of the left breast. (as documented in provider EOT note)  The patient was available for call today.   Symptoms of fatigue have improved since completing therapy.  Symptoms of skin changes have improved since completing therapy.  The patient was encouraged to avoid sun exposure in the area of prior treatment for up to one year following radiation with either sunscreen or by the style of clothing worn in the sun.  The patient has scheduled follow up with her medical oncologist Dr. Mosetta Putt for ongoing surveillance, and was encouraged to call if she develops concerns or questions regarding radiation.   This concludes the interaction.  Ruel Favors, LPN

## 2023-04-13 ENCOUNTER — Ambulatory Visit (INDEPENDENT_AMBULATORY_CARE_PROVIDER_SITE_OTHER): Payer: Self-pay | Admitting: *Deleted

## 2023-04-13 DIAGNOSIS — J309 Allergic rhinitis, unspecified: Secondary | ICD-10-CM

## 2023-04-23 ENCOUNTER — Ambulatory Visit (INDEPENDENT_AMBULATORY_CARE_PROVIDER_SITE_OTHER): Payer: 59

## 2023-04-23 DIAGNOSIS — J309 Allergic rhinitis, unspecified: Secondary | ICD-10-CM | POA: Diagnosis not present

## 2023-04-30 ENCOUNTER — Ambulatory Visit (INDEPENDENT_AMBULATORY_CARE_PROVIDER_SITE_OTHER): Payer: 59

## 2023-04-30 DIAGNOSIS — Z79899 Other long term (current) drug therapy: Secondary | ICD-10-CM | POA: Diagnosis not present

## 2023-04-30 DIAGNOSIS — J309 Allergic rhinitis, unspecified: Secondary | ICD-10-CM | POA: Diagnosis not present

## 2023-05-11 ENCOUNTER — Ambulatory Visit (INDEPENDENT_AMBULATORY_CARE_PROVIDER_SITE_OTHER): Payer: Self-pay | Admitting: *Deleted

## 2023-05-11 DIAGNOSIS — J309 Allergic rhinitis, unspecified: Secondary | ICD-10-CM

## 2023-05-19 ENCOUNTER — Ambulatory Visit (INDEPENDENT_AMBULATORY_CARE_PROVIDER_SITE_OTHER): Payer: Self-pay

## 2023-05-19 DIAGNOSIS — J309 Allergic rhinitis, unspecified: Secondary | ICD-10-CM

## 2023-06-01 ENCOUNTER — Ambulatory Visit (INDEPENDENT_AMBULATORY_CARE_PROVIDER_SITE_OTHER): Payer: Self-pay

## 2023-06-01 DIAGNOSIS — J309 Allergic rhinitis, unspecified: Secondary | ICD-10-CM | POA: Diagnosis not present

## 2023-06-03 ENCOUNTER — Other Ambulatory Visit (HOSPITAL_COMMUNITY): Payer: Self-pay

## 2023-06-10 ENCOUNTER — Telehealth: Payer: Self-pay | Admitting: Nurse Practitioner

## 2023-06-10 NOTE — Telephone Encounter (Signed)
 Marland Kitchen

## 2023-06-11 ENCOUNTER — Ambulatory Visit (INDEPENDENT_AMBULATORY_CARE_PROVIDER_SITE_OTHER): Payer: Self-pay | Admitting: *Deleted

## 2023-06-11 DIAGNOSIS — J309 Allergic rhinitis, unspecified: Secondary | ICD-10-CM

## 2023-06-22 NOTE — Assessment & Plan Note (Addendum)
 Marland Kitchen

## 2023-06-22 NOTE — Assessment & Plan Note (Signed)
 IDC and DCIS, Stage IA, pT1b, cN0, triple negative, Grade 3 -found on screening mammogram. S/p lumpectomy on 01/08/22 with Dr. Donell Beers, path showed: 8 mm invasive and in situ ductal carcinoma with negative margins. Repeat prognostic panel confirmed triple negative disease.  -lymph node biopsies 01/28/22 showed negative nodes (0/2). Port placed at time of procedure. Postoperative course complicated by pneumothorax, which has resolved. -she began adjuvant TC on 02/20/22, and completed planned 4 cycles on 04/24/2022. -she completed adjuvant RT on 07/09/2022  -we discussed cancer surveillance and what to watch at home

## 2023-06-22 NOTE — Assessment & Plan Note (Deleted)
 estrogen receptor negative (HCC) IDC and DCIS, Stage IA, pT1b, cN0, triple negative, Grade 3 -found on screening mammogram. S/p lumpectomy on 01/08/22 with Dr. Donell Beers, path showed: 8 mm invasive and in situ ductal carcinoma with negative margins. Repeat prognostic panel confirmed triple negative disease.  -lymph node biopsies 01/28/22 showed negative nodes (0/2). Port placed at time of procedure. Postoperative course complicated by pneumothorax, which has resolved. -she began adjuvant TC on 02/20/22, and completed planned 4 cycles on 04/24/2022. -she completed adjuvant RT on 07/09/2022  -we discussed cancer surveillance and what to watch at home    -diagnosed in 12/05/2022, ER-/PR-, g2 -s/p left lumpectomy by Dr. Donell Beers on January 01, 2023.  Surgical path showed intermediate grade DCIS with necrosis, margins were negative. -She has completed adjuvant radiation -No role for adjuvant antiestrogen therapy -Continue breast cancer surveillance

## 2023-06-22 NOTE — Progress Notes (Unsigned)
 CLINIC:  Survivorship   REASON FOR VISIT:  Routine follow-up post-treatment for a recent history of breast cancer.  BRIEF ONCOLOGIC HISTORY:  Oncology History Overview Note   Cancer Staging  Malignant neoplasm of upper-outer quadrant of right breast in female, estrogen receptor negative Staging form: Breast, AJCC 8th Edition - Clinical stage from 12/23/2021: cT1b, cM0, G3, ER-, PR-, HER2- - Unsigned Stage prefix: Initial diagnosis Histologic grading system: 3 grade system - Pathologic stage from 01/08/2022: Stage IB (pT1b, pN0, cM0, G3, ER-, PR-, HER2-) - Signed by Malachy Mood, MD on 01/24/2022 Stage prefix: Initial diagnosis Histologic grading system: 3 grade system Residual tumor (R): R0 - None     Malignant neoplasm of upper-outer quadrant of right breast in female, estrogen receptor negative (HCC)  12/06/2021 Mammogram   CLINICAL DATA:  Patient returns today to evaluate RIGHT breast calcifications identified on a recent screening mammogram.   EXAM: DIGITAL DIAGNOSTIC UNILATERAL RIGHT MAMMOGRAM  IMPRESSION: Grouped coarse heterogeneous calcifications within the upper-outer quadrant of the RIGHT breast, measuring 6 mm extent. These may be fibroadenomatous calcifications. Stereotactic biopsy is recommended to exclude malignancy.   12/18/2021 Initial Biopsy   Diagnosis Breast, right, needle core biopsy, upper outer quadrant, x clip - DUCTAL CARCINOMA IN SITU, SOLID AND CRIBRIFORM TYPE WITH COMEDONECROSIS AND ASSOCIATED CALCIFICATIONS, NUCLEAR GRADE 3 OF 3 - FOCAL MICROINVASION IS PRESENT (LESS THAN 1 MM) WITH EVIDENCE OF LYMPHOVASCULAR INVASION - NECROSIS: PRESENT - CALCIFICATIONS: PRESENT - DCIS LENGTH: 7 MM IN GREATEST LINEAR DIMENSION ON FRAGMENTED CORES  PROGNOSTIC INDICATORS Results: IMMUNOHISTOCHEMICAL AND MORPHOMETRIC ANALYSIS PERFORMED MANUALLY The tumor cells are NEGATIVE for Her2 (1+). Estrogen Receptor: 0%, NEGATIVE Progesterone Receptor: 0%,  NEGATIVE Proliferation Marker Ki67: 60%   12/23/2021 Initial Diagnosis   Malignant neoplasm of upper-outer quadrant of right breast in female, estrogen receptor negative (HCC)   12/23/2021 Imaging   EXAM: ULTRASOUND OF THE RIGHT AXILLA  IMPRESSION: No abnormal appearing RIGHT axillary lymph nodes.   01/08/2022 Cancer Staging   Staging form: Breast, AJCC 8th Edition - Pathologic stage from 01/08/2022: Stage IB (pT1b, pN0, cM0, G3, ER-, PR-, HER2-) - Signed by Malachy Mood, MD on 01/24/2022 Stage prefix: Initial diagnosis Histologic grading system: 3 grade system Residual tumor (R): R0 - None   02/20/2022 - 04/24/2022 Chemotherapy   Patient is on Treatment Plan : BREAST TC q21d     10/06/2022 Survivorship   SCP delivered by [name], NP   Ductal carcinoma in situ (DCIS) of left breast  12/09/2022 Initial Diagnosis   Ductal carcinoma in situ (DCIS) of left breast   01/01/2023 Surgery   Let breast seed localized lumpectomy.with Dr. Donell Beers  2.5 cm DCIS with negative margins.     02/11/2023 - 03/10/2023 Radiation Therapy   Dose per fraction - 2.66 Gy Prescribed dose (delivered/prescribed)  42.56/42.56 Prescribed Fxs (delivered/prescribed)  16/16  Boost  Dose per fraction -  2Gy Prescribed dose (delivered/prescribed) 8Gy/8Gy) Prescribed Fxs (delivered/prescribed) (4Gy/4Gy)       INTERVAL HISTORY:  Ms. Vig presents to the Survivorship Clinic today for our initial meeting to review her survivorship care plan detailing her treatment course for breast cancer, as well as monitoring long-term side effects of that treatment, education regarding health maintenance, screening, and overall wellness and health promotion.     Overall, Ms. Dardis reports feeling quite well since completing her radiation therapy approximately 3 months ago.  She reports feeling well overall. She denies chest pain, chest pressure, or shortness of breath. She denies headaches or visual  disturbances. She denies  abdominal pain, nausea, vomiting, or changes in bowel or bladder habits. She does have some remote family history of cancer.  States her mother had very small place in the bladder which was removed.  Her mother did pass away in January 2025, but this was unrelated to cancer.  Symptoms interested in genetic counseling if she qualifies for that service.    REVIEW OF SYSTEMS:  Review of Systems  Constitutional:  Negative for chills and fever.  HENT:  Negative for congestion and sinus pain.   Eyes: Negative.   Respiratory:  Negative for cough and wheezing.   Cardiovascular:  Negative for chest pain and palpitations.  Gastrointestinal:  Negative for constipation, diarrhea, nausea and vomiting.  Genitourinary: Negative.   Musculoskeletal:  Negative for back pain and myalgias.  Skin:  Negative for itching and rash.  Neurological:  Negative for dizziness, weakness and headaches.  Endo/Heme/Allergies:  Negative for environmental allergies and polydipsia. Does not bruise/bleed easily.  Psychiatric/Behavioral:  Positive for depression. The patient is not nervous/anxious.        Slightly worse anxiety/depression since the death of her mother May 01, 2023.   All other systems reviewed and are negative. Breast: Denies any new nodularity, masses, tenderness, nipple changes, or nipple discharge.    ONCOLOGY TREATMENT TEAM:  1. Surgeon:  Dr. Donell [facility]ers at [facility] 2. Medical Oncologist: Dr. Mosetta Putt  3. Radiation Oncologist: Dr. Mitzi Hansen    PAST MEDICAL/SURGICAL HISTORY:  Past Medical History:  Diagnosis Date   Allergy    Anxiety 2015   Result of culmination of super stressful caregiving and deaths of 2 immediate family members.  Overall do pretty well.   Cancer [facility]) 2023   right breast   Depression    denies   GERD (gastroesophageal reflux disease)    Hypothyroidism    Right carpal tunnel syndrome 09/19/2019   Thyroid disease    Past Surgical History:  Procedure Laterality Date    BREAST BIOPSY Left 12/04/2022   MM LT BREAST BX W LOC DEV 1ST LESION IMAGE BX SPEC STEREO GUIDE 12/04/2022 [facility]   BREAST BIOPSY  12/30/2022   MM LT RADIOACTIVE SEED LOC MAMMO GUIDE 12/30/2022 [facility]   BREAST LUMPECTOMY WITH RADIOACTIVE SEED LOCALIZATION Right 01/08/2022   Procedure: RIGHT BREAST LUMPECTOMY WITH RADIOACTIVE SEED LOCALIZATION;  Surgeon: [name], MD;  Location: [facility] OR;  Service: General;  Laterality: Right;   BREAST LUMPECTOMY WITH RADIOACTIVE SEED LOCALIZATION Left 01/01/2023   Procedure: LEFT BREAST SEED LOCALIZED LUMPECTOMY;  Surgeon: [name], MD;  Location: [facility] OR;  Service: General;  Laterality: Left;   PORT-A-CATH REMOVAL Left 05/20/2022   Procedure: REMOVAL PORT-A-CATH;  Surgeon: [name], MD;  Location: [facility];  Service: General;  Laterality: Left;   PORTACATH PLACEMENT Left 01/28/2022   Procedure: INSERTION PORT-A-CATH;  Surgeon: [name], MD;  Location: [facility];  Service: General;  Laterality: Left;   SENTINEL NODE BIOPSY Right 01/28/2022   Procedure: RIGHT SENTINEL LYMPH NODE BIOPSY;  Surgeon: [name], MD;  Location: [facility];  Service: General;  Laterality: Right;   TONSILLECTOMY       ALLERGIES:  Allergies  Allergen Reactions   Nortriptyline Other (See Comments)    depression   Singulair [Montelukast Sodium] Other (See Comments)    Depressed mood   Sulfonamide Derivatives Nausea And Vomiting   Amitriptyline Hcl     Depression mood   Esomeprazole Hives   Gabapentin  Hair Loss   Lexapro [Escitalopram]     Depression mood   Neosporin [Bacitracin-Polymyxin B] Rash     CURRENT MEDICATIONS:  Outpatient Encounter Medications as of 06/23/2023  Medication Sig Note   betamethasone dipropionate 0.05 % cream Apply 1 Application topically 2 (two) times daily. 12/18/2022: Has not started yet   Calcium Carb-Cholecalciferol (CALCIUM-VITAMIN D3) 250-125 MG-UNIT TABS  Take 1 tablet by mouth daily.    Cholecalciferol (VITAMIN D3) 50 MCG (2000 UT) TABS Take 2,000 Units by mouth daily.    EPINEPHrine 0.3 mg/0.3 mL IJ SOAJ injection Inject 0.3 mg into the muscle as needed for anaphylaxis.    famotidine (PEPCID) 20 MG tablet Take 1 tablet (20 mg total) by mouth daily. (Patient taking differently: Take 20 mg by mouth daily as needed for heartburn or indigestion.)    Fezolinetant (VEOZAH) 45 MG TABS Take 45 mg by mouth daily.    Levothyroxine Sodium 88 MCG CAPS Take 88 mcg by mouth See admin instructions. Take 88 mcg by mouth for two days and skip a day then repeat cycle. 12/18/2022: Pt alternating with 100 mcg on the 3rd day   methocarbamol (ROBAXIN) 500 MG tablet Take 1 tablet (500 mg total) by mouth 4 (four) times daily as needed for muscle spasms.    nystatin-triamcinolone (MYCOLOG II) cream Apply 1 Application topically 2 (two) times daily as needed (irritation).    sertraline (ZOLOFT) 50 MG tablet Take 50 mg by mouth daily.    tacrolimus (PROTOPIC) 0.03 % ointment Apply 1 Application topically 2 (two) times daily. 12/18/2022: Has not started yet   TIROSINT 100 MCG CAPS Take 100 mcg by mouth every 3 (three) days. 12/18/2022: Pt also taking 88 mcg for 2 days before and after 100 mcg dose.   No facility-administered encounter medications on file as of 06/23/2023.     ONCOLOGIC FAMILY HISTORY:  Family History  Problem Relation Age of Onset   Hyperlipidemia Father    Diabetes Maternal Grandmother      GENETIC COUNSELING/TESTING: Patient was referred for genetic counseling today.   SOCIAL HISTORY:  LEITA LINDBLOOM is married and lives alone/[place] her [place]  She has 1 child. She denies any current or history of tobacco, alcohol, or illicit drug use.     PHYSICAL EXAMINATION:  Vital Signs:   Vitals:   06/23/23 1116  BP: 122/60  Pulse: 99  Resp: 17  Temp: 97.6 F (36.4 C)  SpO2: 96%   Filed Weights   06/23/23 1116  Weight:  175 lb 11.2 oz (79.7 kg)   General: Well-nourished, well-appearing female in no acute distress.  She is unaccompanied in clinic today.   HEENT: Head is normocephalic.  Pupils equal and reactive to light. Conjunctivae clear without exudate.  Sclerae anicteric. Oral mucosa is pink, moist.  Oropharynx is pink without lesions or erythema.  Lymph: No cervical, supraclavicular, or infraclavicular lymphadenopathy noted on palpation.  Cardiovascular: Regular rate and rhythm.Marland Kitchen Respiratory: Clear to auscultation bilaterally. Chest expansion symmetric; breathing non-labored.  GI: Abdomen soft and round; non-tender, non-distended. Bowel sounds normoactive.  GU: Deferred.  Neuro: No focal deficits. Steady gait.  Psych: Mood and affect normal and appropriate for situation.  Extremities: No edema. MSK: No focal spinal tenderness to palpation.  Full range of motion in bilateral upper extremities Skin: Warm and dry.    ASSESSMENT AND PLAN:  Ms.. Barcomb is a pleasant 63 y.o. female with Stage 1B left breast invasive ductal carcinoma, ER-/PR-/HER2-, diagnosed in August  2024, treated with lumpectomy and adjuvant radiation therapy, Anti-estrogen therapy was not recommended due to triple negative status of her breast cancer.  She presents to the Survivorship Clinic for our initial meeting and routine follow-up post-completion of treatment for breast cancer.    1. Stage 1B left breast cancer:  Ms. Freund is continuing to recover from definitive treatment for breast cancer. She will follow-up with her medical oncologist, Dr. Mosetta Putt in July 2025 with history and physical exam per surveillance protocol.  She is not taking anti-estrogen therapy due to triple negative quality of her breast cancer.  Today, a comprehensive survivorship care plan and treatment summary was reviewed with the patient, detailing her breast cancer diagnosis, treatment course, potential late/long-term effects of treatment, appropriate follow-up care  with recommendations for the future, and patient education resources.  A copy of this summary, along with a letter will be sent to the patient's primary care provider via mail/fax/In Basket message after today's visit.    2. Grief:  Patient is managing grief after the loss of her mother in 05-13-23. Her mother passed away from complications related to Parkinson's disease. She was in hospice. I recommended she contact [facility]ospice [facility] to set up grief counseling to help her manage this better. The patient will continue to take sertraline 50 mg daily.   3. Hyperlipidemia:  Recommend she limit intake of fried and fatty foods. She should increase intake of lean proteins and green leafy vegetables. Adding exercise into daily routine will also be beneficial.  Followed by her primary care provider.   3. Bone health:  Given Ms. Starn's age/history of breast cancer, she is at risk for bone demineralization.  She has not yet had a DEXA scan. This will be ordered in conjunction with her next annual mammogram. In the meantime, she was encouraged to increase her consumption of foods rich in calcium, as well as increase her weight-bearing activities.  She was given education on specific activities to promote bone health.  4. Cancer screening:  Due to Ms. Zangara's history and her age, she should receive screening for skin cancers, colon cancer, and gynecologic cancers.  The information and recommendations are listed on the patient's comprehensive care plan/treatment summary and were reviewed in detail with the patient.    5. Health maintenance and wellness promotion: Ms. Mcconathy was encouraged to consume 5-7 servings of fruits and vegetables per day. We reviewed the "Nutrition Rainbow" handout, as well as the handout "Take Control of Your Health and Reduce Your Cancer Risk" from the American Cancer Society.  She was also encouraged to engage in moderate to vigorous exercise for 30 minutes per day most days of the week.  We discussed the [employer] fitness program, which is designed for cancer survivors to help them become more physically fit after cancer treatments.  She was instructed to limit her alcohol consumption and continue to abstain from tobacco use.    6. Support services/counseling: It is not uncommon for this period of the patient's cancer care trajectory to be one of many emotions and stressors.  We discussed an opportunity for her to participate in the next session of [facility] ("Finding Your New Normal") support group series designed for patients after they have completed treatment.   Ms. Trigueros was encouraged to take advantage of our many other support services programs, support groups, and/or counseling in coping with her new life as a cancer survivor after completing anti-cancer treatment.  She was offered support today through active listening and  expressive supportive counseling.  She was given information regarding our available services and encouraged to contact me with any questions or for help enrolling in any of our support group/programs.    Dispo:   -Return to cancer center July 2025  -Mammogram due in April 2025 -Follow up with surgery April 2025 -She is welcome to return back to the Survivorship Clinic at any time; no additional follow-up needed at this time.  -Consider referral back to survivorship as a long-term survivor for continued surveillance  A total of 45 minutes of face-to-face time was spent with this patient with greater than 50% of that time in counseling and care-coordination.   Vincent Gros, NP Survivorship Program [facility] [phone]   Note: PRIMARY CARE PROVIDER Rodrigo Ran, MD [phone]

## 2023-06-23 ENCOUNTER — Ambulatory Visit (INDEPENDENT_AMBULATORY_CARE_PROVIDER_SITE_OTHER): Payer: Self-pay | Admitting: *Deleted

## 2023-06-23 ENCOUNTER — Inpatient Hospital Stay: Payer: 59 | Attending: Nurse Practitioner | Admitting: Nurse Practitioner

## 2023-06-23 ENCOUNTER — Encounter: Payer: Self-pay | Admitting: Nurse Practitioner

## 2023-06-23 VITALS — BP 122/60 | HR 99 | Temp 97.6°F | Resp 17 | Wt 175.7 lb

## 2023-06-23 DIAGNOSIS — Z83438 Family history of other disorder of lipoprotein metabolism and other lipidemia: Secondary | ICD-10-CM | POA: Diagnosis not present

## 2023-06-23 DIAGNOSIS — Z833 Family history of diabetes mellitus: Secondary | ICD-10-CM | POA: Diagnosis not present

## 2023-06-23 DIAGNOSIS — Z8349 Family history of other endocrine, nutritional and metabolic diseases: Secondary | ICD-10-CM | POA: Insufficient documentation

## 2023-06-23 DIAGNOSIS — D0512 Intraductal carcinoma in situ of left breast: Secondary | ICD-10-CM

## 2023-06-23 DIAGNOSIS — Z882 Allergy status to sulfonamides status: Secondary | ICD-10-CM | POA: Diagnosis not present

## 2023-06-23 DIAGNOSIS — Z79621 Long term (current) use of calcineurin inhibitor: Secondary | ICD-10-CM | POA: Diagnosis not present

## 2023-06-23 DIAGNOSIS — Z888 Allergy status to other drugs, medicaments and biological substances status: Secondary | ICD-10-CM | POA: Diagnosis not present

## 2023-06-23 DIAGNOSIS — C50411 Malignant neoplasm of upper-outer quadrant of right female breast: Secondary | ICD-10-CM | POA: Diagnosis not present

## 2023-06-23 DIAGNOSIS — Z171 Estrogen receptor negative status [ER-]: Secondary | ICD-10-CM | POA: Insufficient documentation

## 2023-06-23 DIAGNOSIS — Z79899 Other long term (current) drug therapy: Secondary | ICD-10-CM | POA: Diagnosis not present

## 2023-06-23 DIAGNOSIS — J309 Allergic rhinitis, unspecified: Secondary | ICD-10-CM | POA: Diagnosis not present

## 2023-06-23 DIAGNOSIS — Z9089 Acquired absence of other organs: Secondary | ICD-10-CM | POA: Insufficient documentation

## 2023-06-23 DIAGNOSIS — Z1732 Human epidermal growth factor receptor 2 negative status: Secondary | ICD-10-CM | POA: Insufficient documentation

## 2023-06-23 DIAGNOSIS — Z1722 Progesterone receptor negative status: Secondary | ICD-10-CM | POA: Insufficient documentation

## 2023-06-23 DIAGNOSIS — F32A Depression, unspecified: Secondary | ICD-10-CM | POA: Insufficient documentation

## 2023-06-23 DIAGNOSIS — Z820 Family history of epilepsy and other diseases of the nervous system: Secondary | ICD-10-CM | POA: Diagnosis not present

## 2023-06-24 ENCOUNTER — Encounter: Payer: Self-pay | Admitting: Hematology

## 2023-06-24 ENCOUNTER — Encounter: Payer: Self-pay | Admitting: Nurse Practitioner

## 2023-06-25 ENCOUNTER — Other Ambulatory Visit: Payer: Self-pay

## 2023-06-29 ENCOUNTER — Other Ambulatory Visit: Payer: Self-pay

## 2023-07-02 ENCOUNTER — Telehealth: Payer: Self-pay | Admitting: Genetic Counselor

## 2023-07-03 ENCOUNTER — Ambulatory Visit (INDEPENDENT_AMBULATORY_CARE_PROVIDER_SITE_OTHER): Payer: Self-pay

## 2023-07-03 DIAGNOSIS — J309 Allergic rhinitis, unspecified: Secondary | ICD-10-CM | POA: Diagnosis not present

## 2023-07-06 ENCOUNTER — Encounter: Payer: Self-pay | Admitting: Hematology

## 2023-07-08 ENCOUNTER — Telehealth: Payer: Self-pay | Admitting: Hematology

## 2023-07-08 NOTE — Telephone Encounter (Signed)
 Spoke with patient stated she would like to wait to her appointment in July and ask some questions about Gen before scheduling.

## 2023-07-13 ENCOUNTER — Ambulatory Visit (INDEPENDENT_AMBULATORY_CARE_PROVIDER_SITE_OTHER): Admitting: *Deleted

## 2023-07-13 DIAGNOSIS — J309 Allergic rhinitis, unspecified: Secondary | ICD-10-CM

## 2023-07-22 ENCOUNTER — Other Ambulatory Visit: Payer: Self-pay | Admitting: Nurse Practitioner

## 2023-07-22 DIAGNOSIS — D0512 Intraductal carcinoma in situ of left breast: Secondary | ICD-10-CM

## 2023-07-22 DIAGNOSIS — C50411 Malignant neoplasm of upper-outer quadrant of right female breast: Secondary | ICD-10-CM

## 2023-07-23 ENCOUNTER — Ambulatory Visit (INDEPENDENT_AMBULATORY_CARE_PROVIDER_SITE_OTHER): Payer: Self-pay

## 2023-07-23 DIAGNOSIS — J309 Allergic rhinitis, unspecified: Secondary | ICD-10-CM | POA: Diagnosis not present

## 2023-07-29 ENCOUNTER — Ambulatory Visit (INDEPENDENT_AMBULATORY_CARE_PROVIDER_SITE_OTHER): Payer: Self-pay

## 2023-07-29 DIAGNOSIS — J309 Allergic rhinitis, unspecified: Secondary | ICD-10-CM

## 2023-08-06 ENCOUNTER — Ambulatory Visit
Admission: RE | Admit: 2023-08-06 | Discharge: 2023-08-06 | Disposition: A | Discharge status: Home / Self Care | Source: Ambulatory Visit | Attending: Nurse Practitioner | Admitting: Nurse Practitioner

## 2023-08-06 ENCOUNTER — Ambulatory Visit (INDEPENDENT_AMBULATORY_CARE_PROVIDER_SITE_OTHER): Payer: Self-pay

## 2023-08-06 DIAGNOSIS — J309 Allergic rhinitis, unspecified: Secondary | ICD-10-CM | POA: Diagnosis not present

## 2023-08-06 DIAGNOSIS — D0512 Intraductal carcinoma in situ of left breast: Secondary | ICD-10-CM

## 2023-08-06 DIAGNOSIS — Z171 Estrogen receptor negative status [ER-]: Secondary | ICD-10-CM

## 2023-08-11 DIAGNOSIS — H43392 Other vitreous opacities, left eye: Secondary | ICD-10-CM | POA: Diagnosis not present

## 2023-08-11 DIAGNOSIS — H43812 Vitreous degeneration, left eye: Secondary | ICD-10-CM | POA: Diagnosis not present

## 2023-08-14 ENCOUNTER — Other Ambulatory Visit: Payer: Self-pay

## 2023-08-14 DIAGNOSIS — Z171 Estrogen receptor negative status [ER-]: Secondary | ICD-10-CM

## 2023-08-14 DIAGNOSIS — D0512 Intraductal carcinoma in situ of left breast: Secondary | ICD-10-CM

## 2023-08-14 NOTE — Progress Notes (Signed)
 Spoke with scheduler with [facility] regarding order placed for 3d dx BL mm.  Requested if mm could be for contrast enhanced.  Scheduler stated the order has to be modified or reordered stating contrast enhanced.  Scheduler stated the scheduling of the contrast enhanced mm are done differently from regular mm but they will reach out to the pt to get the pt scheduled once the order is corrected.  New order routed to [facility] requesting Contrast Enhanced MM.  Fax confirmation received.  [facility] Scheduler will contact the pt to get her scheduled.

## 2023-08-18 ENCOUNTER — Ambulatory Visit (INDEPENDENT_AMBULATORY_CARE_PROVIDER_SITE_OTHER): Payer: Self-pay

## 2023-08-18 DIAGNOSIS — J302 Other seasonal allergic rhinitis: Secondary | ICD-10-CM | POA: Diagnosis not present

## 2023-08-18 DIAGNOSIS — E785 Hyperlipidemia, unspecified: Secondary | ICD-10-CM | POA: Diagnosis not present

## 2023-08-18 DIAGNOSIS — I2510 Atherosclerotic heart disease of native coronary artery without angina pectoris: Secondary | ICD-10-CM | POA: Diagnosis not present

## 2023-08-18 DIAGNOSIS — R945 Abnormal results of liver function studies: Secondary | ICD-10-CM | POA: Diagnosis not present

## 2023-08-18 DIAGNOSIS — E039 Hypothyroidism, unspecified: Secondary | ICD-10-CM | POA: Diagnosis not present

## 2023-08-18 DIAGNOSIS — F418 Other specified anxiety disorders: Secondary | ICD-10-CM | POA: Diagnosis not present

## 2023-08-18 DIAGNOSIS — J309 Allergic rhinitis, unspecified: Secondary | ICD-10-CM | POA: Diagnosis not present

## 2023-08-18 DIAGNOSIS — M8580 Other specified disorders of bone density and structure, unspecified site: Secondary | ICD-10-CM | POA: Diagnosis not present

## 2023-08-18 DIAGNOSIS — K219 Gastro-esophageal reflux disease without esophagitis: Secondary | ICD-10-CM | POA: Diagnosis not present

## 2023-08-18 DIAGNOSIS — L301 Dyshidrosis [pompholyx]: Secondary | ICD-10-CM | POA: Diagnosis not present

## 2023-08-18 DIAGNOSIS — R7989 Other specified abnormal findings of blood chemistry: Secondary | ICD-10-CM | POA: Diagnosis not present

## 2023-08-18 DIAGNOSIS — R911 Solitary pulmonary nodule: Secondary | ICD-10-CM | POA: Diagnosis not present

## 2023-08-18 DIAGNOSIS — R7301 Impaired fasting glucose: Secondary | ICD-10-CM | POA: Diagnosis not present

## 2023-08-26 ENCOUNTER — Encounter: Payer: Self-pay | Admitting: Hematology

## 2023-08-27 ENCOUNTER — Ambulatory Visit (INDEPENDENT_AMBULATORY_CARE_PROVIDER_SITE_OTHER)

## 2023-08-27 DIAGNOSIS — J309 Allergic rhinitis, unspecified: Secondary | ICD-10-CM

## 2023-09-03 ENCOUNTER — Ambulatory Visit (INDEPENDENT_AMBULATORY_CARE_PROVIDER_SITE_OTHER)

## 2023-09-03 DIAGNOSIS — J309 Allergic rhinitis, unspecified: Secondary | ICD-10-CM

## 2023-09-10 ENCOUNTER — Ambulatory Visit (INDEPENDENT_AMBULATORY_CARE_PROVIDER_SITE_OTHER)

## 2023-09-10 DIAGNOSIS — J309 Allergic rhinitis, unspecified: Secondary | ICD-10-CM

## 2023-09-14 ENCOUNTER — Encounter: Payer: Self-pay | Admitting: Dietician

## 2023-09-14 ENCOUNTER — Encounter: Attending: Internal Medicine | Admitting: Dietician

## 2023-09-14 VITALS — Ht 68.0 in | Wt 181.0 lb

## 2023-09-14 DIAGNOSIS — E782 Mixed hyperlipidemia: Secondary | ICD-10-CM | POA: Diagnosis present

## 2023-09-14 NOTE — Progress Notes (Signed)
 Medical Nutrition Therapy  Appointment Start time:  [phone]  Appointment End time:  1145  Primary concerns today: HLD  Referral diagnosis: E78.5 - HLD, E66.3 - Overweight Preferred learning style: Auditory, Visual, Hands on Learning readiness: Ready   NUTRITION ASSESSMENT   Anthropometrics Ht: 68" Wt: 181.0 lbs BMI: 27.52 kg/m2   Clinical Medical Hx: HLD, Hypothyroidism, Breast cancer Medications: Atorvastatin, Ezetimibe, Sertraline , Fezolinetant, Triosint Labs: (01/27/2023) Cholesterol - 234, LDL - 150, Alkaline phosphatase - 137, A1c - 5.7%, (08/20/2023) A1c - 5.5%, Cholesterol - 176, LDL - 92, HDL - 60 Notable Signs/Symptoms: N/A    Lifestyle & Dietary Hx Pt reports concern over elevated cholesterol, states it became elevated after finishing chemo in January of 2024. Pt reports starting a statin and ezetimibe after elevated labs, values have improved with medications. Pt reports stress over numerous things, mother passed early this year and caring for their father, elevated cholesterol, and being very busy without being able to stop and rest.Pt reports they respond to this by stress eating or rewarding themselves for completing tasks during the day, pt reports having chips and salsa or Cheez-Its w/ cheese as their reward food.  Pt reports liking to garden, but doesn't do much physical activity outside of that.    Estimated daily fluid intake: 64 oz Supplements: Calcium , Vit D Sleep: ~6-7 hours, usually well rested Stress / self-care: High Current average weekly physical activity: ADLs, gardening  24-Hr Dietary Recall First Meal: English muffin w/ melted cheese, hot tea w/ sugar and half and half Snack: none Second Meal: Roast beef sandwich on white wheat w/ mayonnaise, apple, unsweet tea Snack: none Third Meal: 2 Scrambled eggs, grits w/ butter and salt, peach cobbler, unsweet tea Snack:  Beverages: Water, unsweet tea, hot tea    NUTRITION DIAGNOSIS  NB-1.1 Food and  nutrition-related knowledge deficit As related to HLD.  As evidenced by Cholesterol of 234, LDL of 150 mg/dL, dietary recall high in saturated fats.   NUTRITION INTERVENTION  Nutrition education (E-1) on the following topics:  Educate pt on the difference between LDL and HDL cholesterol.  Educate pt on the factors that can increase and decrease HDL cholesterol, including exercise to increase HDL, and tobacco use to decrease HDL.  Educate pt on factors that can elevate LDL cholesterol, including high dietary intake of saturated fats. Educate pt on identifying sources of saturated fats, and how to make alternative food choices to lower saturated fat intake. Educate pt on the role of soluble fiber in binding to cholesterol in the GI tract an eliminating it from the body. Educate pt on dietary sources of soluble fiber.  Educate pt on the potential dietary causes of hypertriglyceridemia, including concentrated sugars and sugar sweetened beverages. Educate on the role of elevated LDL,total cholesterol, and triglycerides on cardiovascular health. Educate pt on the role of physical activity in lowering LDL and increasing HDL cholesterol.   Handouts Provided Include  Saturated Fats worksheet LDL Cholesterol Lowering Therapy Balanced Plate Balanced Plate Food List  Learning Style & Readiness for Change Teaching method utilized: Visual & Auditory  Demonstrated degree of understanding via: Teach Back  Barriers to learning/adherence to lifestyle change: None  Goals Established by Pt Use a combination of these three strategies to effectively lower your consumption of saturated fat 1) Consume a smaller portion when having animal products 2) Consume animal products less frequently 3) Find a reduced or low fat version ways Keep up the great work eating three meals a day, about 5-6 hours  apart! Begin to build your meals using the proportions of the Balanced Plate. First, select your carb choice(s) for  the meal. Make this 25% of your meal. Next, select your source of protein to pair with your carb choice(s). Make this another 25% of your meal. Finally, complete your meal with a variety of non-starchy vegetables. Make this the remaining 50% of your meal. Increase your physical activity on the days that you are not gardening!   MONITORING & EVALUATION Dietary intake, weekly physical activity, and labs in 6 weeks.  Next Steps  Patient is to follow up with RD.

## 2023-09-14 NOTE — Patient Instructions (Addendum)
 Use a combination of these three strategies to effectively lower your consumption of saturated fat 1) Consume a smaller portion when having animal products 2) Consume animal products less frequently 3) Find a reduced or low fat version ways  Keep up the great work eating three meals a day, about 5-6 hours apart!  Begin to build your meals using the proportions of the Balanced Plate. First, select your carb choice(s) for the meal. Make this 25% of your meal. Next, select your source of protein to pair with your carb choice(s). Make this another 25% of your meal. Finally, complete your meal with a variety of non-starchy vegetables. Make this the remaining 50% of your meal.  Increase your physical activity on the days that you are not gardening!

## 2023-09-18 ENCOUNTER — Ambulatory Visit (INDEPENDENT_AMBULATORY_CARE_PROVIDER_SITE_OTHER): Payer: Self-pay | Admitting: *Deleted

## 2023-09-18 DIAGNOSIS — J309 Allergic rhinitis, unspecified: Secondary | ICD-10-CM

## 2023-09-25 ENCOUNTER — Ambulatory Visit (INDEPENDENT_AMBULATORY_CARE_PROVIDER_SITE_OTHER): Payer: Self-pay

## 2023-09-25 DIAGNOSIS — J309 Allergic rhinitis, unspecified: Secondary | ICD-10-CM | POA: Diagnosis not present

## 2023-09-29 ENCOUNTER — Ambulatory Visit (INDEPENDENT_AMBULATORY_CARE_PROVIDER_SITE_OTHER)

## 2023-09-29 DIAGNOSIS — L820 Inflamed seborrheic keratosis: Secondary | ICD-10-CM | POA: Diagnosis not present

## 2023-09-29 DIAGNOSIS — J309 Allergic rhinitis, unspecified: Secondary | ICD-10-CM

## 2023-09-29 DIAGNOSIS — L218 Other seborrheic dermatitis: Secondary | ICD-10-CM | POA: Diagnosis not present

## 2023-09-29 DIAGNOSIS — R911 Solitary pulmonary nodule: Secondary | ICD-10-CM | POA: Diagnosis not present

## 2023-10-08 ENCOUNTER — Ambulatory Visit

## 2023-10-08 DIAGNOSIS — J309 Allergic rhinitis, unspecified: Secondary | ICD-10-CM | POA: Diagnosis not present

## 2023-10-13 ENCOUNTER — Encounter: Payer: Self-pay | Admitting: Hematology

## 2023-10-13 ENCOUNTER — Encounter: Payer: Self-pay | Admitting: Cardiology

## 2023-10-13 ENCOUNTER — Other Ambulatory Visit (HOSPITAL_COMMUNITY): Payer: Self-pay | Admitting: Internal Medicine

## 2023-10-13 DIAGNOSIS — R911 Solitary pulmonary nodule: Secondary | ICD-10-CM

## 2023-10-13 NOTE — Progress Notes (Deleted)
 Samantha Neth, MD Reason for referral-coronary calcification  HPI: 63 year old female for evaluation of coronary calcification at request of Samantha Neth, MD.  Calcium  score February 2025 49 which was 75th percentile.  Note of 10 x 8 mm peripheral nodule in the left upper lobe and follow-up recommended.  Cardiology now asked to evaluate.  Current Outpatient Medications  Medication Sig Dispense Refill   atorvastatin (LIPITOR) 20 MG tablet Take 20 mg by mouth daily.     betamethasone dipropionate 0.05 % cream Apply 1 Application topically 2 (two) times daily.     Calcium  Carb-Cholecalciferol (CALCIUM -VITAMIN D3) 250-125 MG-UNIT TABS Take 1 tablet by mouth daily.     Cholecalciferol (VITAMIN D3) 50 MCG (2000 UT) TABS Take 2,000 Units by mouth daily.     EPINEPHrine  0.3 mg/0.3 mL IJ SOAJ injection Inject 0.3 mg into the muscle as needed for anaphylaxis. 1 each 1   ezetimibe (ZETIA) 10 MG tablet 1 tablet Orally Once a day for 90 days     famotidine  (PEPCID ) 20 MG tablet Take 1 tablet (20 mg total) by mouth daily. (Patient taking differently: Take 20 mg by mouth daily as needed for heartburn or indigestion.)     Fezolinetant (VEOZAH) 45 MG TABS Take 45 mg by mouth daily.     Levothyroxine  Sodium 88 MCG CAPS Take 88 mcg by mouth See admin instructions. Take 88 mcg by mouth for two days and skip a day then repeat cycle.  10   methocarbamol  (ROBAXIN ) 500 MG tablet Take 1 tablet (500 mg total) by mouth 4 (four) times daily as needed for muscle spasms. 60 tablet 0   nystatin -triamcinolone (MYCOLOG II) cream Apply 1 Application topically 2 (two) times daily as needed (irritation).     sertraline  (ZOLOFT ) 50 MG tablet Take 50 mg by mouth daily.     tacrolimus (PROTOPIC) 0.03 % ointment Apply 1 Application topically 2 (two) times daily.     TIROSINT  100 MCG CAPS Take 100 mcg by mouth every 3 (three) days.     No current facility-administered medications for this visit.    Allergies   Allergen Reactions   Nortriptyline  Other (See Comments)    depression   Singulair  [Montelukast  Sodium] Other (See Comments)    Depressed mood   Sulfonamide Derivatives Nausea And Vomiting   Amitriptyline  Hcl     Depression mood   Esomeprazole Hives   Gabapentin      Hair Loss   Lexapro [Escitalopram]     Depression mood   Neosporin [Bacitracin-Polymyxin B] Rash     Past Medical History:  Diagnosis Date   Allergy    Anxiety 2015   Result of culmination of super stressful caregiving and deaths of 2 immediate family members.  Overall do pretty well.   Cancer [facility]) 2023   right breast   Depression    denies   GERD (gastroesophageal reflux disease)    Hypothyroidism    Right carpal tunnel syndrome 09/19/2019   Thyroid  disease     Past Surgical History:  Procedure Laterality Date   BREAST BIOPSY Left 12/04/2022   MM LT BREAST BX W LOC DEV 1ST LESION IMAGE BX SPEC STEREO GUIDE 12/04/2022 [facility]   BREAST BIOPSY  12/30/2022   MM LT RADIOACTIVE SEED LOC MAMMO GUIDE 12/30/2022 [facility]   BREAST LUMPECTOMY WITH RADIOACTIVE SEED LOCALIZATION Right 01/08/2022   Procedure: RIGHT BREAST LUMPECTOMY WITH RADIOACTIVE SEED LOCALIZATION;  Surgeon: [name], MD;  [facility];  Service: General;  Laterality:  Right;   BREAST LUMPECTOMY WITH RADIOACTIVE SEED LOCALIZATION Left 01/01/2023   Procedure: LEFT BREAST SEED LOCALIZED LUMPECTOMY;  Surgeon: [name], MD;  Location: [facility];  Service: General;  Laterality: Left;   PORT-A-CATH REMOVAL Left 05/20/2022   Procedure: REMOVAL PORT-A-CATH;  Surgeon: [name], MD;  Location: [facility];  Service: General;  Laterality: Left;   PORTACATH PLACEMENT Left 01/28/2022   Procedure: INSERTION PORT-A-CATH;  Surgeon: [name], MD;  Location: [facility];  Service: General;  Laterality: Left;   SENTINEL NODE BIOPSY Right 01/28/2022   Procedure: RIGHT SENTINEL LYMPH NODE BIOPSY;  Surgeon:  [name], MD;  Location: [facility];  Service: General;  Laterality: Right;   TONSILLECTOMY      Social History   Socioeconomic History   Marital status: Married    Spouse name: Not on file   Number of children: 1   Years of education: Not on file   Highest education level: Not on file  Occupational History   Not on file  Tobacco Use   Smoking status: Never   Smokeless tobacco: Never  Vaping Use   Vaping status: Never Used  Substance and Sexual Activity   Alcohol use: Yes    Comment: maybe 1 drink 4 times a year   Drug use: No   Sexual activity: Not Currently    Birth control/protection: Post-menopausal  Other Topics Concern   Not on file  Social History Narrative   Not on file   Social Drivers of Health   Financial Resource Strain: Not on file  Food Insecurity: No Food Insecurity (01/28/2023)   Hunger Vital Sign    Worried About Running Out of Food in the Last Year: Never true    Ran Out of Food in the Last Year: Never true  Transportation Needs: No Transportation Needs (01/28/2023)   PRAPARE - Administrator, Civil Service (Medical): No    Lack of Transportation (Non-Medical): No  Physical Activity: Not on file  Stress: Not on file  Social Connections: Unknown (08/26/2021)   [facility]   Social Network    Social Network: Not on file  Intimate Partner Violence: Not At Risk (01/28/2023)   Humiliation, Afraid, Rape, and Kick questionnaire    Fear of Current or Ex-Partner: No    Emotionally Abused: No    Physically Abused: No    Sexually Abused: No    Family History  Problem Relation Age of Onset   Hyperlipidemia Father    Diabetes Maternal Grandmother     ROS: no fevers or chills, productive cough, hemoptysis, dysphasia, odynophagia, melena, hematochezia, dysuria, hematuria, rash, seizure activity, orthopnea, PND, pedal edema, claudication. Remaining systems are negative.  Physical Exam:   There were no  vitals taken for this visit.  General:  Well developed/well nourished in NAD Skin warm/dry Patient not depressed No peripheral clubbing Back-normal HEENT-normal/normal eyelids Neck supple/normal carotid upstroke bilaterally; no bruits; no JVD; no thyromegaly chest - CTA/ normal expansion CV - RRR/normal S1 and S2; no murmurs, rubs or gallops;  PMI nondisplaced Abdomen -NT/ND, no HSM, no mass, + bowel sounds, no bruit 2+ femoral pulses, no bruits Ext-no edema, chords, 2+ DP Neuro-grossly nonfocal  ECG - personally reviewed  A/P  1 coronary calcification-  2 hyperlipidemia-  Redell Shallow, MD

## 2023-10-15 ENCOUNTER — Ambulatory Visit (HOSPITAL_COMMUNITY)
Admission: RE | Admit: 2023-10-15 | Discharge: 2023-10-15 | Disposition: A | Discharge status: Home / Self Care | Source: Ambulatory Visit | Attending: Internal Medicine | Admitting: Internal Medicine

## 2023-10-15 DIAGNOSIS — R911 Solitary pulmonary nodule: Secondary | ICD-10-CM | POA: Diagnosis not present

## 2023-10-15 LAB — GLUCOSE, CAPILLARY: Glucose-Capillary: 112 mg/dL — ABNORMAL HIGH (ref 70–99)

## 2023-10-15 MED ORDER — FLUDEOXYGLUCOSE F - 18 (FDG) INJECTION
8.8200 | Freq: Once | INTRAVENOUS | Status: AC
  Administered 2023-10-15: 8.82 via INTRAVENOUS

## 2023-10-19 ENCOUNTER — Telehealth: Payer: Self-pay

## 2023-10-19 ENCOUNTER — Other Ambulatory Visit: Payer: Self-pay

## 2023-10-19 ENCOUNTER — Telehealth: Payer: Self-pay | Admitting: Hematology

## 2023-10-19 NOTE — Telephone Encounter (Signed)
 Patient called in stating she had a PET scan done as ordered by Dr. [name]. Scan has resulted and Dr. Neth would like for Dr. Lanny to review results and contact patient with follow up if needed.

## 2023-10-19 NOTE — Telephone Encounter (Signed)
 Scheduled appointment per 7/7 secure chat. Talked with the patient and she is aware of the made appointment.

## 2023-10-20 ENCOUNTER — Ambulatory Visit: Admitting: Hematology

## 2023-10-20 ENCOUNTER — Inpatient Hospital Stay: Attending: Hematology | Admitting: Hematology

## 2023-10-20 ENCOUNTER — Ambulatory Visit: Admitting: Cardiology

## 2023-10-20 ENCOUNTER — Other Ambulatory Visit: Payer: Self-pay

## 2023-10-20 ENCOUNTER — Ambulatory Visit (INDEPENDENT_AMBULATORY_CARE_PROVIDER_SITE_OTHER)

## 2023-10-20 VITALS — BP 133/80 | HR 87 | Temp 98.0°F | Resp 17 | Ht 68.0 in | Wt 175.5 lb

## 2023-10-20 DIAGNOSIS — C50411 Malignant neoplasm of upper-outer quadrant of right female breast: Secondary | ICD-10-CM

## 2023-10-20 DIAGNOSIS — Z79621 Long term (current) use of calcineurin inhibitor: Secondary | ICD-10-CM | POA: Diagnosis not present

## 2023-10-20 DIAGNOSIS — R410 Disorientation, unspecified: Secondary | ICD-10-CM | POA: Diagnosis not present

## 2023-10-20 DIAGNOSIS — Z79899 Other long term (current) drug therapy: Secondary | ICD-10-CM | POA: Diagnosis not present

## 2023-10-20 DIAGNOSIS — Z1732 Human epidermal growth factor receptor 2 negative status: Secondary | ICD-10-CM | POA: Diagnosis not present

## 2023-10-20 DIAGNOSIS — R110 Nausea: Secondary | ICD-10-CM | POA: Diagnosis not present

## 2023-10-20 DIAGNOSIS — Z171 Estrogen receptor negative status [ER-]: Secondary | ICD-10-CM | POA: Diagnosis not present

## 2023-10-20 DIAGNOSIS — R221 Localized swelling, mass and lump, neck: Secondary | ICD-10-CM | POA: Diagnosis not present

## 2023-10-20 DIAGNOSIS — Z9089 Acquired absence of other organs: Secondary | ICD-10-CM | POA: Insufficient documentation

## 2023-10-20 DIAGNOSIS — H9201 Otalgia, right ear: Secondary | ICD-10-CM | POA: Diagnosis not present

## 2023-10-20 DIAGNOSIS — D0512 Intraductal carcinoma in situ of left breast: Secondary | ICD-10-CM | POA: Insufficient documentation

## 2023-10-20 DIAGNOSIS — C773 Secondary and unspecified malignant neoplasm of axilla and upper limb lymph nodes: Secondary | ICD-10-CM | POA: Insufficient documentation

## 2023-10-20 DIAGNOSIS — Z9221 Personal history of antineoplastic chemotherapy: Secondary | ICD-10-CM | POA: Insufficient documentation

## 2023-10-20 DIAGNOSIS — Z1722 Progesterone receptor negative status: Secondary | ICD-10-CM | POA: Insufficient documentation

## 2023-10-20 DIAGNOSIS — Z888 Allergy status to other drugs, medicaments and biological substances status: Secondary | ICD-10-CM | POA: Diagnosis not present

## 2023-10-20 DIAGNOSIS — Z882 Allergy status to sulfonamides status: Secondary | ICD-10-CM | POA: Diagnosis not present

## 2023-10-20 DIAGNOSIS — Z923 Personal history of irradiation: Secondary | ICD-10-CM | POA: Insufficient documentation

## 2023-10-20 DIAGNOSIS — N6331 Unspecified lump in axillary tail of the right breast: Secondary | ICD-10-CM | POA: Insufficient documentation

## 2023-10-20 DIAGNOSIS — J309 Allergic rhinitis, unspecified: Secondary | ICD-10-CM

## 2023-10-20 DIAGNOSIS — R911 Solitary pulmonary nodule: Secondary | ICD-10-CM | POA: Diagnosis not present

## 2023-10-20 DIAGNOSIS — I2510 Atherosclerotic heart disease of native coronary artery without angina pectoris: Secondary | ICD-10-CM | POA: Insufficient documentation

## 2023-10-20 NOTE — Progress Notes (Addendum)
 Open in error

## 2023-10-20 NOTE — Assessment & Plan Note (Signed)
-  diagnosed in 12/05/2022, ER-/PR-, g2 -s/p left lumpectomy by Dr. Donell Beers on January 01, 2023.  Surgical path showed intermediate grade DCIS with necrosis, margins were negative. -She has completed adjuvant radiation -No role for adjuvant antiestrogen therapy -Continue breast cancer surveillance

## 2023-10-20 NOTE — Progress Notes (Signed)
 [facility]   Telephone:([phone]) [phone] [phone]   Clinic Follow up Note   Patient Care Team: [name], MD as PCP - General (Internal Medicine) Glean Stephane BROCKS, RN (Inactive) as Oncology Nurse Navigator [name], RN as Oncology Nurse Navigator [name], MD as Consulting Physician (General Surgery) [name], MD as Consulting Physician (Hematology) [name], MD as Consulting Physician (Radiation Oncology) [name], MD as Consulting Physician (Endocrinology) [name], MD as Consulting Physician (Sports Medicine) [name], MD as Consulting Physician (Obstetrics and Gynecology) [name], [name], NP as Nurse Practitioner (Nurse Practitioner) Imaging, [facility] as Radiologist (Diagnostic Radiology)  Date of Service:  10/20/2023  CHIEF COMPLAINT: f/u of breast cancer and to review PET scan images  CURRENT THERAPY:  Cancer surveillance  Oncology History   Ductal carcinoma in situ (DCIS) of left breast -diagnosed in 12/05/2022, ER-/PR-, g2 -s/p left lumpectomy by Dr. Aron on January 01, 2023.  Surgical path showed intermediate grade DCIS with necrosis, margins were negative. -She has completed adjuvant radiation -No role for adjuvant antiestrogen therapy -Continue breast cancer surveillance  Malignant neoplasm of upper-outer quadrant of right breast in female, estrogen receptor negative (HCC) IDC and DCIS, Stage IA, pT1b, cN0, triple negative, Grade 3 -found on screening mammogram. S/p lumpectomy on 01/08/22 with Dr. Aron, path showed: 8 mm invasive and in situ ductal carcinoma with negative margins. Repeat prognostic panel confirmed triple negative disease.  -lymph node biopsies 01/28/22 showed negative nodes (0/2). Port placed at time of procedure. Postoperative course complicated by pneumothorax, which has resolved. -she began adjuvant TC on 02/20/22, and completed planned 4 cycles on  04/24/2022. -she completed adjuvant RT on 07/09/2022  -we discussed cancer surveillance and what to watch at home    Assessment & Plan Breast cancer with possible recurrence, lung nodule  Recent PET scan shows small lymph nodes positive in the right chest wall, behind the pectoral muscle, and near the collarbone. Lung nodules are not suspicious.  PET scan was obtained after her cardiac CT scan showed a left lung nodule.  Differential diagnosis includes reactive lymph nodes due to previous surgery or radiation, or possible recurrence of triple-negative breast cancer. Discussed limitations of PET scan and possibility of inflammation or infection causing uptake. Biopsy of lymph nodes may be difficult due to location. ctDNA blood tests such as Guardian Review and Signatera can detect microscopic tumor cells and provide additional information. - Order Guardian Review blood test tomorrow  - Consult with pulmonology regarding bronchoscopy biopsy - Discuss case at tumor conference - Order right diagnostic mammogram and ultrasound or breast MRI  - Coordinate with breast center for scheduling of mammogram or MRI - Schedule follow-up call in two weeks to discuss results  Plan - I personally reviewed her PET scan images and discussed the findings with patient and her husband.  Also reviewed [facility] - Will obtain CT DNA guardian review next week - Will reach out to her surgeon Dr. Aron, and schedule her diagnostic mammogram/ultrasound, breast MRI. - Phone follow-up in 2 weeks to review the above test results.   SUMMARY OF ONCOLOGIC HISTORY: Oncology History Overview Note   Cancer Staging  Malignant neoplasm of upper-outer quadrant of right breast in female, estrogen receptor negative Staging form: Breast, AJCC 8th Edition - Clinical stage from 12/23/2021: cT1b, cM0, G3, ER-, PR-, HER2- - Unsigned Stage prefix: Initial diagnosis Histologic grading system: 3 grade  system - Pathologic  stage from 01/08/2022: Stage IB (pT1b, pN0, cM0, G3, ER-, PR-, HER2-) - Signed by [name], MD on 01/24/2022 Stage prefix: Initial diagnosis Histologic grading system: 3 grade system Residual tumor (R): R0 - None     Malignant neoplasm of upper-outer quadrant of right breast in female, estrogen receptor negative (HCC)  12/06/2021 Mammogram   CLINICAL DATA:  Patient returns today to evaluate RIGHT breast calcifications identified on a recent screening mammogram.   EXAM: DIGITAL DIAGNOSTIC UNILATERAL RIGHT MAMMOGRAM  IMPRESSION: Grouped coarse heterogeneous calcifications within the upper-outer quadrant of the RIGHT breast, measuring 6 mm extent. These may be fibroadenomatous calcifications. Stereotactic biopsy is recommended to exclude malignancy.   12/18/2021 Initial Biopsy   Diagnosis Breast, right, needle core biopsy, upper outer quadrant, x clip - DUCTAL CARCINOMA IN SITU, SOLID AND CRIBRIFORM TYPE WITH COMEDONECROSIS AND ASSOCIATED CALCIFICATIONS, NUCLEAR GRADE 3 OF 3 - FOCAL MICROINVASION IS PRESENT (LESS THAN 1 MM) WITH EVIDENCE OF LYMPHOVASCULAR INVASION - NECROSIS: PRESENT - CALCIFICATIONS: PRESENT - DCIS LENGTH: 7 MM IN GREATEST LINEAR DIMENSION ON FRAGMENTED CORES  PROGNOSTIC INDICATORS Results: IMMUNOHISTOCHEMICAL AND MORPHOMETRIC ANALYSIS PERFORMED MANUALLY The tumor cells are NEGATIVE for Her2 (1+). Estrogen Receptor: 0%, NEGATIVE Progesterone Receptor: 0%, NEGATIVE Proliferation Marker Ki67: 60%   12/23/2021 Initial Diagnosis   Malignant neoplasm of upper-outer quadrant of right breast in female, estrogen receptor negative (HCC)   12/23/2021 Imaging   EXAM: ULTRASOUND OF THE RIGHT AXILLA  IMPRESSION: No abnormal appearing RIGHT axillary lymph nodes.   01/08/2022 Cancer Staging   Staging form: Breast, AJCC 8th Edition - Pathologic stage from 01/08/2022: Stage IB (pT1b, pN0, cM0, G3, ER-, PR-, HER2-) - Signed by [name], MD on  01/24/2022 Stage prefix: Initial diagnosis Histologic grading system: 3 grade system Residual tumor (R): R0 - None   02/20/2022 - 04/24/2022 Chemotherapy   Patient is on Treatment Plan : BREAST TC q21d     10/06/2022 Survivorship   SCP delivered by Lacie [name], NP   Ductal carcinoma in situ (DCIS) of left breast  12/09/2022 Initial Diagnosis   Ductal carcinoma in situ (DCIS) of left breast   01/01/2023 Surgery   Let breast seed localized lumpectomy.with Dr. Aron  2.5 cm DCIS with negative margins.     02/11/2023 - 03/10/2023 Radiation Therapy   Dose per fraction - 2.66 Gy Prescribed dose (delivered/prescribed)  42.56/42.56 Prescribed Fxs (delivered/prescribed)  16/16  Boost  Dose per fraction -  2Gy Prescribed dose (delivered/prescribed) 8Gy/8Gy) Prescribed Fxs (delivered/prescribed) (4Gy/4Gy)        Discussed the use of AI scribe software for clinical note transcription with the patient, who gave verbal consent to proceed.  History of Present Illness Samantha [name] is a 63 year old female with a history of breast cancer who presents for a review of her recent PET scan results.  She has a history of invasive carcinoma on the right breast and ductal carcinoma in situ (DCIS) on the left breast. Treatment included chemotherapy, surgery, and radiation therapy.  A recent cardiac CT scan incidentally revealed a lung nodule, confirmed by a chest CT. A PET scan showed nodules in the lung near the hilum with uptake in small lymph nodes in the right chest wall, behind the pectoral muscle, and near the collarbone.  She has experienced ear pain since February, with symptoms including pain radiating to the jaw and neck, swelling in the ear canal, and flaking skin. A dermatologist diagnosed severe dermatitis in the ear canal, causing significant inflammation. She noticed a  protruding lymph vessel that has since receded. No current pain, tenderness, or redness.     All other systems were  reviewed with the patient and are negative.  MEDICAL HISTORY:  Past Medical History:  Diagnosis Date   Allergy    Anxiety 2015   Result of culmination of super stressful caregiving and deaths of 2 immediate family members.  Overall do pretty well.   Cancer [facility]) 2023   right breast   Coronary artery calcification    Depression    denies   GERD (gastroesophageal reflux disease)    Hyperlipidemia    Hypothyroidism    Right carpal tunnel syndrome 09/19/2019   Thyroid  disease     SURGICAL HISTORY: Past Surgical History:  Procedure Laterality Date   BREAST BIOPSY Left 12/04/2022   MM LT BREAST BX W LOC DEV 1ST LESION IMAGE BX SPEC STEREO GUIDE 12/04/2022 [facility]   BREAST BIOPSY  12/30/2022   MM LT RADIOACTIVE SEED LOC MAMMO GUIDE 12/30/2022 [facility]   BREAST LUMPECTOMY WITH RADIOACTIVE SEED LOCALIZATION Right 01/08/2022   Procedure: RIGHT BREAST LUMPECTOMY WITH RADIOACTIVE SEED LOCALIZATION;  Surgeon: [name], MD;  Location[facility];  Service: General;  Laterality: Right;   BREAST LUMPECTOMY WITH RADIOACTIVE SEED LOCALIZATION Left 01/01/2023   Procedure: LEFT BREAST SEED LOCALIZED LUMPECTOMY;  Surgeon: [name], MD;  Location: [facility];  Service: General;  Laterality: Left;   PORT-A-CATH REMOVAL Left 05/20/2022   Procedure: REMOVAL PORT-A-CATH;  Surgeon: [name], MD;  Location: [facility];  Service: General;  Laterality: Left;   PORTACATH PLACEMENT Left 01/28/2022   Procedure: INSERTION PORT-A-CATH;  Surgeon: [name], MD;  Location: [facility];  Service: General;  Laterality: Left;   SENTINEL NODE BIOPSY Right 01/28/2022   Procedure: RIGHT SENTINEL LYMPH NODE BIOPSY;  Surgeon: [name], MD;  Location: [facility];  Service: General;  Laterality: Right;   TONSILLECTOMY      I have reviewed the social history and family history with the patient and they are unchanged from previous note.  ALLERGIES:  is  allergic to nortriptyline , singulair  [montelukast  sodium], sulfonamide derivatives, amitriptyline  hcl, esomeprazole, gabapentin , lexapro [escitalopram], and neosporin [bacitracin-polymyxin b].  MEDICATIONS:  Current Outpatient Medications  Medication Sig Dispense Refill   atorvastatin (LIPITOR) 20 MG tablet Take 20 mg by mouth daily.     betamethasone dipropionate 0.05 % cream Apply 1 Application topically 2 (two) times daily.     Calcium  Carb-Cholecalciferol (CALCIUM -VITAMIN D3) 250-125 MG-UNIT TABS Take 1 tablet by mouth daily.     Cholecalciferol (VITAMIN D3) 50 MCG (2000 UT) TABS Take 2,000 Units by mouth daily.     EPINEPHrine  0.3 mg/0.3 mL IJ SOAJ injection Inject 0.3 mg into the muscle as needed for anaphylaxis. 1 each 1   ezetimibe (ZETIA) 10 MG tablet 1 tablet Orally Once a day for 90 days     famotidine  (PEPCID ) 20 MG tablet Take 1 tablet (20 mg total) by mouth daily. (Patient taking differently: Take 20 mg by mouth daily as needed for heartburn or indigestion.)     Fezolinetant (VEOZAH) 45 MG TABS Take 45 mg by mouth daily.     Levothyroxine  Sodium 88 MCG CAPS Take 88 mcg by mouth See admin instructions. Take 88 mcg by mouth for two days and skip a day then repeat cycle.  10   methocarbamol  (ROBAXIN ) 500 MG tablet Take 1 tablet (500 mg total) by mouth 4 (four) times daily as needed for muscle spasms.  60 tablet 0   nystatin -triamcinolone (MYCOLOG II) cream Apply 1 Application topically 2 (two) times daily as needed (irritation).     sertraline  (ZOLOFT ) 50 MG tablet Take 50 mg by mouth daily.     tacrolimus (PROTOPIC) 0.03 % ointment Apply 1 Application topically 2 (two) times daily.     TIROSINT  100 MCG CAPS Take 100 mcg by mouth every 3 (three) days.     No current facility-administered medications for this visit.    PHYSICAL EXAMINATION: ECOG PERFORMANCE STATUS: 0 - Asymptomatic  There were no vitals filed for this visit. Wt Readings from Last 3 Encounters:  10/20/23 175 lb 8  oz (79.6 kg)  09/14/23 181 lb (82.1 kg)  06/23/23 175 lb 11.2 oz (79.7 kg)     GENERAL:alert, no distress and comfortable SKIN: skin color, texture, turgor are normal, no rashes or significant lesions EYES: normal, Conjunctiva are pink and non-injected, sclera clear NECK: supple, thyroid  normal size, non-tender, without nodularity LYMPH:  no palpable lymphadenopathy in the cervical, axillary  LUNGS: clear to auscultation and percussion with normal breathing effort HEART: regular rate & rhythm and no murmurs and no lower extremity edema ABDOMEN:abdomen soft, non-tender and normal bowel sounds Musculoskeletal:no cyanosis of digits and no clubbing  NEURO: alert & oriented x 3 with fluent speech, no focal motor/sensory deficits  Physical Exam   LABORATORY DATA:  I have reviewed the data as listed    Latest Ref Rng & Units 12/09/2022   12:02 PM 10/06/2022   11:04 AM 07/16/2022   11:24 AM  CBC  WBC 4.0 - 10.5 K/uL 8.4  5.2  4.7   Hemoglobin 12.0 - 15.0 g/dL 85.6  86.4  85.7   Hematocrit 36.0 - 46.0 % 43.4  41.6  43.4   Platelets 150 - 400 K/uL 270  246  239         Latest Ref Rng & Units 12/09/2022   12:02 PM 10/06/2022   11:04 AM 07/16/2022   11:24 AM  CMP  Glucose 70 - 99 mg/dL 867  848  890   BUN 8 - 23 mg/dL 14  15  14    Creatinine 0.44 - 1.00 mg/dL 9.32  9.27  9.43   Sodium 135 - 145 mmol/L 141  139  139   Potassium 3.5 - 5.1 mmol/L 3.9  3.9  3.9   Chloride 98 - 111 mmol/L 105  105  105   CO2 22 - 32 mmol/L 28  27  28    Calcium  8.9 - 10.3 mg/dL 9.9  9.5  9.6   Total Protein 6.5 - 8.1 g/dL 7.7  7.0  7.5   Total Bilirubin 0.3 - 1.2 mg/dL 0.4  0.5  0.4   Alkaline Phos 38 - 126 U/L 113  106  107   AST 15 - 41 U/L 21  22  20    ALT 0 - 44 U/L 23  23  18        RADIOGRAPHIC STUDIES: I have personally reviewed the radiological images as listed and agreed with the findings in the report. No results found.    No orders of the defined types were placed in this  encounter.  All questions were answered. The patient knows to call the clinic with any problems, questions or concerns. No barriers to learning was detected. The total time spent in the appointment was 25 minutes, including review of chart and various tests results, discussions about plan of care and coordination of care plan  Onita Mattock, MD 10/20/2023

## 2023-10-20 NOTE — Addendum Note (Signed)
 Addended by: [name] on: 10/20/2023 05:41 PM   Modules accepted: Level of Service

## 2023-10-20 NOTE — Progress Notes (Signed)
 As per Dr. Lanny, order was placed in portal for Guardant Reveal to be done on July 9, order was sent to lab to be drawn. Paperwork was uploaded with order and confirmation of receipt was given.

## 2023-10-20 NOTE — Assessment & Plan Note (Signed)
 IDC and DCIS, Stage IA, pT1b, cN0, triple negative, Grade 3 -found on screening mammogram. S/p lumpectomy on 01/08/22 with Dr. Donell Beers, path showed: 8 mm invasive and in situ ductal carcinoma with negative margins. Repeat prognostic panel confirmed triple negative disease.  -lymph node biopsies 01/28/22 showed negative nodes (0/2). Port placed at time of procedure. Postoperative course complicated by pneumothorax, which has resolved. -she began adjuvant TC on 02/20/22, and completed planned 4 cycles on 04/24/2022. -she completed adjuvant RT on 07/09/2022  -we discussed cancer surveillance and what to watch at home

## 2023-10-21 ENCOUNTER — Other Ambulatory Visit: Payer: Self-pay | Admitting: Hematology

## 2023-10-21 ENCOUNTER — Other Ambulatory Visit

## 2023-10-21 DIAGNOSIS — R6889 Other general symptoms and signs: Secondary | ICD-10-CM

## 2023-10-21 DIAGNOSIS — C50411 Malignant neoplasm of upper-outer quadrant of right female breast: Secondary | ICD-10-CM

## 2023-10-23 ENCOUNTER — Ambulatory Visit
Admission: RE | Admit: 2023-10-23 | Discharge: 2023-10-23 | Disposition: A | Discharge status: Home / Self Care | Source: Ambulatory Visit | Attending: Hematology

## 2023-10-23 ENCOUNTER — Other Ambulatory Visit: Payer: Self-pay | Admitting: Hematology

## 2023-10-23 ENCOUNTER — Ambulatory Visit
Admission: RE | Admit: 2023-10-23 | Discharge: 2023-10-23 | Disposition: A | Discharge status: Home / Self Care | Source: Ambulatory Visit | Attending: Hematology | Admitting: Hematology

## 2023-10-23 DIAGNOSIS — R6889 Other general symptoms and signs: Secondary | ICD-10-CM

## 2023-10-23 DIAGNOSIS — C801 Malignant (primary) neoplasm, unspecified: Secondary | ICD-10-CM | POA: Diagnosis not present

## 2023-10-23 DIAGNOSIS — R591 Generalized enlarged lymph nodes: Secondary | ICD-10-CM

## 2023-10-23 DIAGNOSIS — Z853 Personal history of malignant neoplasm of breast: Secondary | ICD-10-CM | POA: Diagnosis not present

## 2023-10-23 DIAGNOSIS — R590 Localized enlarged lymph nodes: Secondary | ICD-10-CM | POA: Diagnosis not present

## 2023-10-26 ENCOUNTER — Other Ambulatory Visit: Payer: Self-pay

## 2023-10-26 ENCOUNTER — Ambulatory Visit: Admitting: Dietician

## 2023-10-26 ENCOUNTER — Inpatient Hospital Stay (HOSPITAL_BASED_OUTPATIENT_CLINIC_OR_DEPARTMENT_OTHER): Admitting: Hematology

## 2023-10-26 DIAGNOSIS — I2510 Atherosclerotic heart disease of native coronary artery without angina pectoris: Secondary | ICD-10-CM | POA: Diagnosis not present

## 2023-10-26 DIAGNOSIS — Z171 Estrogen receptor negative status [ER-]: Secondary | ICD-10-CM | POA: Diagnosis not present

## 2023-10-26 DIAGNOSIS — Z79621 Long term (current) use of calcineurin inhibitor: Secondary | ICD-10-CM | POA: Diagnosis not present

## 2023-10-26 DIAGNOSIS — R110 Nausea: Secondary | ICD-10-CM | POA: Diagnosis not present

## 2023-10-26 DIAGNOSIS — C773 Secondary and unspecified malignant neoplasm of axilla and upper limb lymph nodes: Secondary | ICD-10-CM | POA: Diagnosis not present

## 2023-10-26 DIAGNOSIS — D0512 Intraductal carcinoma in situ of left breast: Secondary | ICD-10-CM | POA: Diagnosis not present

## 2023-10-26 DIAGNOSIS — R221 Localized swelling, mass and lump, neck: Secondary | ICD-10-CM | POA: Diagnosis not present

## 2023-10-26 DIAGNOSIS — Z9089 Acquired absence of other organs: Secondary | ICD-10-CM | POA: Diagnosis not present

## 2023-10-26 DIAGNOSIS — Z1722 Progesterone receptor negative status: Secondary | ICD-10-CM | POA: Diagnosis not present

## 2023-10-26 DIAGNOSIS — Z79899 Other long term (current) drug therapy: Secondary | ICD-10-CM | POA: Diagnosis not present

## 2023-10-26 DIAGNOSIS — Z882 Allergy status to sulfonamides status: Secondary | ICD-10-CM | POA: Diagnosis not present

## 2023-10-26 DIAGNOSIS — C50411 Malignant neoplasm of upper-outer quadrant of right female breast: Secondary | ICD-10-CM

## 2023-10-26 DIAGNOSIS — R911 Solitary pulmonary nodule: Secondary | ICD-10-CM | POA: Diagnosis not present

## 2023-10-26 DIAGNOSIS — Z9221 Personal history of antineoplastic chemotherapy: Secondary | ICD-10-CM | POA: Diagnosis not present

## 2023-10-26 DIAGNOSIS — H9201 Otalgia, right ear: Secondary | ICD-10-CM | POA: Diagnosis not present

## 2023-10-26 DIAGNOSIS — R410 Disorientation, unspecified: Secondary | ICD-10-CM | POA: Diagnosis not present

## 2023-10-26 DIAGNOSIS — Z888 Allergy status to other drugs, medicaments and biological substances status: Secondary | ICD-10-CM | POA: Diagnosis not present

## 2023-10-26 DIAGNOSIS — N6331 Unspecified lump in axillary tail of the right breast: Secondary | ICD-10-CM | POA: Diagnosis not present

## 2023-10-26 DIAGNOSIS — Z923 Personal history of irradiation: Secondary | ICD-10-CM | POA: Diagnosis not present

## 2023-10-26 DIAGNOSIS — Z1732 Human epidermal growth factor receptor 2 negative status: Secondary | ICD-10-CM | POA: Diagnosis not present

## 2023-10-26 LAB — SURGICAL PATHOLOGY

## 2023-10-26 NOTE — Assessment & Plan Note (Signed)
-  diagnosed in 12/05/2022, ER-/PR-, g2 -s/p left lumpectomy by Dr. Donell Beers on January 01, 2023.  Surgical path showed intermediate grade DCIS with necrosis, margins were negative. -She has completed adjuvant radiation -No role for adjuvant antiestrogen therapy -Continue breast cancer surveillance

## 2023-10-26 NOTE — Assessment & Plan Note (Signed)
 IDC and DCIS, Stage IA, pT1b, cN0, triple negative, Grade 3 -found on screening mammogram. S/p lumpectomy on 01/08/22 with Dr. Donell Beers, path showed: 8 mm invasive and in situ ductal carcinoma with negative margins. Repeat prognostic panel confirmed triple negative disease.  -lymph node biopsies 01/28/22 showed negative nodes (0/2). Port placed at time of procedure. Postoperative course complicated by pneumothorax, which has resolved. -she began adjuvant TC on 02/20/22, and completed planned 4 cycles on 04/24/2022. -she completed adjuvant RT on 07/09/2022  -we discussed cancer surveillance and what to watch at home

## 2023-10-26 NOTE — Progress Notes (Signed)
 [facility]   [phone] [phone]   Clinic Follow up Note   Patient Care Team: [name], MD as PCP - General (Internal Medicine) Glean Stephane BROCKS, RN (Inactive) as Oncology Nurse Navigator [name], RN as Oncology Nurse Navigator [name], MD as Consulting Physician (General Surgery) [name], MD as Consulting Physician (Hematology) [name], MD as Consulting Physician (Radiation Oncology) [name], MD as Consulting Physician (Endocrinology) [name], MD as Consulting Physician (Sports Medicine) [name], MD as Consulting Physician (Obstetrics and Gynecology) [name], [name], NP as Nurse Practitioner (Nurse Practitioner) Imaging, [facility] as Radiologist (Diagnostic Radiology) 10/26/2023  I connected with Samantha [name] on 10/26/23 at  2:00 PM EDT by telephone and verified that I am speaking with the correct person using two identifiers.   I discussed the limitations, risks, security and privacy concerns of performing an evaluation and management service by telephone and the availability of in person appointments. I also discussed with the patient that there may be a patient responsible charge related to this service. The patient expressed understanding and agreed to proceed.   Patient's location: Home Provider's location:  Office    CHIEF COMPLAINT: Follow-up of biopsy results   CURRENT THERAPY: Pending  Oncology history Malignant neoplasm of upper-outer quadrant of right breast in female, estrogen receptor negative (HCC) IDC and DCIS, Stage IA, pT1b, cN0, triple negative, Grade 3 -found on screening mammogram. S/p lumpectomy on 01/08/22 with Dr. Aron, path showed: 8 mm invasive and in situ ductal carcinoma with negative margins. Repeat prognostic panel confirmed triple negative disease.  -lymph node biopsies 01/28/22 showed negative nodes (0/2). Port placed at time of procedure.  Postoperative course complicated by pneumothorax, which has resolved. -she began adjuvant TC on 02/20/22, and completed planned 4 cycles on 04/24/2022. -she completed adjuvant RT on 07/09/2022  -we discussed cancer surveillance and what to watch at home   Ductal carcinoma in situ (DCIS) of left breast -diagnosed in 12/05/2022, ER-/PR-, g2 -s/p left lumpectomy by Dr. Aron on January 01, 2023.  Surgical path showed intermediate grade DCIS with necrosis, margins were negative. -She has completed adjuvant radiation -No role for adjuvant antiestrogen therapy -Continue breast cancer surveillance  Assessment & Plan Chest wall lymph node metastasis Lymph node biopsy confirmed cancer cells. Suspected recurrence of previous breast cancer, pending ER, PR, and HER2 status. Previous radiation therapy limits further radiation in the same area. - Await ER, PR, and HER2 test results - Consult with Dr. Cori regarding surgical removal of lymph nodes, which may not be feasible. - If this is a recurrence of triple negative breast cancer, we will likely plan for chemotherapy - Will check PD-L1 expression and evaluate for immunotherapy candidacy if triple-negative breast cancer is confirmed  Lung nodule Lung nodule identified on CT scan was not visible on PET scan, indicating it may not be of immediate concern. - Schedule follow-up CT and PET scans to monitor lung nodule  Plan - I discussed her recent right chest wall lymph node biopsy results, pending ER, PR and HER2 status - I sent a message to her surgeon Dr. Aron, to get a surgical opinion - Will call her next week when the above results are back, to finalize her treatment plan.   SUMMARY OF ONCOLOGIC HISTORY: Oncology History Overview Note   Cancer Staging  Malignant neoplasm of upper-outer quadrant of right breast in female, estrogen receptor negative Staging form: Breast, AJCC 8th Edition -  Clinical stage from 12/23/2021: cT1b, cM0, G3, ER-,  PR-, HER2- - Unsigned Stage prefix: Initial diagnosis Histologic grading system: 3 grade system - Pathologic stage from 01/08/2022: Stage IB (pT1b, pN0, cM0, G3, ER-, PR-, HER2-) - Signed by [name], MD on 01/24/2022 Stage prefix: Initial diagnosis Histologic grading system: 3 grade system Residual tumor (R): R0 - None     Malignant neoplasm of upper-outer quadrant of right breast in female, estrogen receptor negative (HCC)  12/06/2021 Mammogram   CLINICAL DATA:  Patient returns today to evaluate RIGHT breast calcifications identified on a recent screening mammogram.   EXAM: DIGITAL DIAGNOSTIC UNILATERAL RIGHT MAMMOGRAM  IMPRESSION: Grouped coarse heterogeneous calcifications within the upper-outer quadrant of the RIGHT breast, measuring 6 mm extent. These may be fibroadenomatous calcifications. Stereotactic biopsy is recommended to exclude malignancy.   12/18/2021 Initial Biopsy   Diagnosis Breast, right, needle core biopsy, upper outer quadrant, x clip - DUCTAL CARCINOMA IN SITU, SOLID AND CRIBRIFORM TYPE WITH COMEDONECROSIS AND ASSOCIATED CALCIFICATIONS, NUCLEAR GRADE 3 OF 3 - FOCAL MICROINVASION IS PRESENT (LESS THAN 1 MM) WITH EVIDENCE OF LYMPHOVASCULAR INVASION - NECROSIS: PRESENT - CALCIFICATIONS: PRESENT - DCIS LENGTH: 7 MM IN GREATEST LINEAR DIMENSION ON FRAGMENTED CORES  PROGNOSTIC INDICATORS Results: IMMUNOHISTOCHEMICAL AND MORPHOMETRIC ANALYSIS PERFORMED MANUALLY The tumor cells are NEGATIVE for Her2 (1+). Estrogen Receptor: 0%, NEGATIVE Progesterone Receptor: 0%, NEGATIVE Proliferation Marker Ki67: 60%   12/23/2021 Initial Diagnosis   Malignant neoplasm of upper-outer quadrant of right breast in female, estrogen receptor negative (HCC)   12/23/2021 Imaging   EXAM: ULTRASOUND OF THE RIGHT AXILLA  IMPRESSION: No abnormal appearing RIGHT axillary lymph nodes.   01/08/2022 Cancer Staging   Staging form: Breast, AJCC 8th Edition - Pathologic stage from 01/08/2022:  Stage IB (pT1b, pN0, cM0, G3, ER-, PR-, HER2-) - Signed by [name], MD on 01/24/2022 Stage prefix: Initial diagnosis Histologic grading system: 3 grade system Residual tumor (R): R0 - None   02/20/2022 - 04/24/2022 Chemotherapy   Patient is on Treatment Plan : BREAST TC q21d     10/06/2022 Survivorship   SCP delivered by Lacie [name], NP   Ductal carcinoma in situ (DCIS) of left breast  12/09/2022 Initial Diagnosis   Ductal carcinoma in situ (DCIS) of left breast   01/01/2023 Surgery   Let breast seed localized lumpectomy.with Dr. Aron  2.5 cm DCIS with negative margins.     02/11/2023 - 03/10/2023 Radiation Therapy   Dose per fraction - 2.66 Gy Prescribed dose (delivered/prescribed)  42.56/42.56 Prescribed Fxs (delivered/prescribed)  16/16  Boost  Dose per fraction -  2Gy Prescribed dose (delivered/prescribed) 8Gy/8Gy) Prescribed Fxs (delivered/prescribed) (4Gy/4Gy)       Discussed the use of AI scribe software for clinical note transcription with the patient, who gave verbal consent to proceed.  History of Present Illness Samantha [name] is a 63 year old female with breast cancer who presents to discuss recent lymph node biopsy results.  An ultrasound identified a lymph node, which was biopsied and found to contain cancer cells. Further tests are pending to determine if this is a recurrence of her previous breast cancer, including estrogen receptor, progesterone receptor, and HER2 status.  She has undergone radiation therapy in the affected area approximately a year ago and had a port removed, indicating prior chemotherapy treatment.  A lung nodule was identified on a CT scan but did not appear on a subsequent PET scan.     REVIEW OF SYSTEMS:   Constitutional: Denies fevers, chills or abnormal  weight loss Eyes: Denies blurriness of vision Ears, nose, mouth, throat, and face: Denies mucositis or sore throat Respiratory: Denies cough, dyspnea or  wheezes Cardiovascular: Denies palpitation, chest discomfort or lower extremity swelling Gastrointestinal:  Denies nausea, heartburn or change in bowel habits Skin: Denies abnormal skin rashes Lymphatics: Denies new lymphadenopathy or easy bruising Neurological:Denies numbness, tingling or new weaknesses Behavioral/Psych: Mood is stable, no new changes  All other systems were reviewed with the patient and are negative.  MEDICAL HISTORY:  Past Medical History:  Diagnosis Date   Allergy    Anxiety 2015   Result of culmination of super stressful caregiving and deaths of 2 immediate family members.  Overall do pretty well.   Cancer [facility]) 2023   right breast   Coronary artery calcification    Depression    denies   GERD (gastroesophageal reflux disease)    Hyperlipidemia    Hypothyroidism    Right carpal tunnel syndrome 09/19/2019   Thyroid  disease     SURGICAL HISTORY: Past Surgical History:  Procedure Laterality Date   BREAST BIOPSY Left 12/04/2022   MM LT BREAST BX W LOC DEV 1ST LESION IMAGE BX SPEC STEREO GUIDE 12/04/2022 [facility]   BREAST BIOPSY  12/30/2022   MM LT RADIOACTIVE SEED LOC MAMMO GUIDE 12/30/2022 [facility]   BREAST LUMPECTOMY WITH RADIOACTIVE SEED LOCALIZATION Right 01/08/2022   Procedure: RIGHT BREAST LUMPECTOMY WITH RADIOACTIVE SEED LOCALIZATION;  Surgeon: [name], MD;  Location: [facility] OR;  Service: General;  Laterality: Right;   BREAST LUMPECTOMY WITH RADIOACTIVE SEED LOCALIZATION Left 01/01/2023   Procedure: LEFT BREAST SEED LOCALIZED LUMPECTOMY;  Surgeon: [name], MD;  Location: [facility] OR;  Service: General;  Laterality: Left;   PORT-A-CATH REMOVAL Left 05/20/2022   Procedure: REMOVAL PORT-A-CATH;  Surgeon: [name], MD;  Location: [facility];  Service: General;  Laterality: Left;   PORTACATH PLACEMENT Left 01/28/2022   Procedure: INSERTION PORT-A-CATH;  Surgeon: [name], MD;  Location: [facility];   Service: General;  Laterality: Left;   SENTINEL NODE BIOPSY Right 01/28/2022   Procedure: RIGHT SENTINEL LYMPH NODE BIOPSY;  Surgeon: [name], MD;  Location: [facility];  Service: General;  Laterality: Right;   TONSILLECTOMY      I have reviewed the social history and family history with the patient and they are unchanged from previous note.  ALLERGIES:  is allergic to nortriptyline , singulair  [montelukast  sodium], sulfonamide derivatives, amitriptyline  hcl, esomeprazole, gabapentin , lexapro [escitalopram], and neosporin [bacitracin-polymyxin b].  MEDICATIONS:  Current Outpatient Medications  Medication Sig Dispense Refill   atorvastatin (LIPITOR) 20 MG tablet Take 20 mg by mouth daily.     betamethasone dipropionate 0.05 % cream Apply 1 Application topically 2 (two) times daily.     Calcium  Carb-Cholecalciferol (CALCIUM -VITAMIN D3) 250-125 MG-UNIT TABS Take 1 tablet by mouth daily.     Cholecalciferol (VITAMIN D3) 50 MCG (2000 UT) TABS Take 2,000 Units by mouth daily.     EPINEPHrine  0.3 mg/0.3 mL IJ SOAJ injection Inject 0.3 mg into the muscle as needed for anaphylaxis. 1 each 1   ezetimibe (ZETIA) 10 MG tablet 1 tablet Orally Once a day for 90 days     famotidine  (PEPCID ) 20 MG tablet Take 1 tablet (20 mg total) by mouth daily. (Patient taking differently: Take 20 mg by mouth daily as needed for heartburn or indigestion.)     Fezolinetant (VEOZAH) 45 MG TABS Take 45 mg by mouth daily.     Levothyroxine  Sodium  88 MCG CAPS Take 88 mcg by mouth See admin instructions. Take 88 mcg by mouth for two days and skip a day then repeat cycle.  10   methocarbamol  (ROBAXIN ) 500 MG tablet Take 1 tablet (500 mg total) by mouth 4 (four) times daily as needed for muscle spasms. 60 tablet 0   nystatin -triamcinolone (MYCOLOG II) cream Apply 1 Application topically 2 (two) times daily as needed (irritation).     sertraline  (ZOLOFT ) 50 MG tablet Take 50 mg by mouth daily.     tacrolimus  (PROTOPIC) 0.03 % ointment Apply 1 Application topically 2 (two) times daily.     TIROSINT  100 MCG CAPS Take 100 mcg by mouth every 3 (three) days.     No current facility-administered medications for this visit.    PHYSICAL EXAMINATION: Not performed   LABORATORY DATA:  I have reviewed the data as listed    Latest Ref Rng & Units 12/09/2022   12:02 PM 10/06/2022   11:04 AM 07/16/2022   11:24 AM  CBC  WBC 4.0 - 10.5 K/uL 8.4  5.2  4.7   Hemoglobin 12.0 - 15.0 g/dL 85.6  86.4  85.7   Hematocrit 36.0 - 46.0 % 43.4  41.6  43.4   Platelets 150 - 400 K/uL 270  246  239         Latest Ref Rng & Units 12/09/2022   12:02 PM 10/06/2022   11:04 AM 07/16/2022   11:24 AM  CMP  Glucose 70 - 99 mg/dL 867  848  890   BUN 8 - 23 mg/dL 14  15  14    Creatinine 0.44 - 1.00 mg/dL 9.32  9.27  9.43   Sodium 135 - 145 mmol/L 141  139  139   Potassium 3.5 - 5.1 mmol/L 3.9  3.9  3.9   Chloride 98 - 111 mmol/L 105  105  105   CO2 22 - 32 mmol/L 28  27  28    Calcium  8.9 - 10.3 mg/dL 9.9  9.5  9.6   Total Protein 6.5 - 8.1 g/dL 7.7  7.0  7.5   Total Bilirubin 0.3 - 1.2 mg/dL 0.4  0.5  0.4   Alkaline Phos 38 - 126 U/L 113  106  107   AST 15 - 41 U/L 21  22  20    ALT 0 - 44 U/L 23  23  18        RADIOGRAPHIC STUDIES: I have personally reviewed the radiological images as listed and agreed with the findings in the report. No results found.     I discussed the assessment and treatment plan with the patient. The patient was provided an opportunity to ask questions and all were answered. The patient agreed with the plan and demonstrated an understanding of the instructions.   The patient was advised to call back or seek an in-person evaluation if the symptoms worsen or if the condition fails to improve as anticipated.  I provided 15 minutes of non face-to-face telephone visit time during this encounter, including review of chart and various tests results, discussions about plan of care and coordination  of care plan.    Onita Mattock, MD 10/26/23

## 2023-10-28 ENCOUNTER — Other Ambulatory Visit: Payer: Self-pay | Admitting: General Surgery

## 2023-10-28 ENCOUNTER — Inpatient Hospital Stay (HOSPITAL_BASED_OUTPATIENT_CLINIC_OR_DEPARTMENT_OTHER): Admitting: Hematology

## 2023-10-28 ENCOUNTER — Encounter: Payer: Self-pay | Admitting: Hematology

## 2023-10-28 ENCOUNTER — Other Ambulatory Visit: Payer: Self-pay

## 2023-10-28 DIAGNOSIS — R911 Solitary pulmonary nodule: Secondary | ICD-10-CM | POA: Diagnosis not present

## 2023-10-28 DIAGNOSIS — D0512 Intraductal carcinoma in situ of left breast: Secondary | ICD-10-CM | POA: Diagnosis not present

## 2023-10-28 DIAGNOSIS — N6331 Unspecified lump in axillary tail of the right breast: Secondary | ICD-10-CM | POA: Diagnosis not present

## 2023-10-28 DIAGNOSIS — H9201 Otalgia, right ear: Secondary | ICD-10-CM | POA: Diagnosis not present

## 2023-10-28 DIAGNOSIS — C773 Secondary and unspecified malignant neoplasm of axilla and upper limb lymph nodes: Secondary | ICD-10-CM | POA: Diagnosis not present

## 2023-10-28 DIAGNOSIS — Z171 Estrogen receptor negative status [ER-]: Secondary | ICD-10-CM

## 2023-10-28 DIAGNOSIS — R410 Disorientation, unspecified: Secondary | ICD-10-CM | POA: Diagnosis not present

## 2023-10-28 DIAGNOSIS — Z1722 Progesterone receptor negative status: Secondary | ICD-10-CM | POA: Diagnosis not present

## 2023-10-28 DIAGNOSIS — C50411 Malignant neoplasm of upper-outer quadrant of right female breast: Secondary | ICD-10-CM | POA: Diagnosis not present

## 2023-10-28 DIAGNOSIS — R110 Nausea: Secondary | ICD-10-CM | POA: Diagnosis not present

## 2023-10-28 DIAGNOSIS — Z79621 Long term (current) use of calcineurin inhibitor: Secondary | ICD-10-CM | POA: Diagnosis not present

## 2023-10-28 DIAGNOSIS — Z923 Personal history of irradiation: Secondary | ICD-10-CM | POA: Diagnosis not present

## 2023-10-28 DIAGNOSIS — Z9089 Acquired absence of other organs: Secondary | ICD-10-CM | POA: Diagnosis not present

## 2023-10-28 DIAGNOSIS — Z888 Allergy status to other drugs, medicaments and biological substances status: Secondary | ICD-10-CM | POA: Diagnosis not present

## 2023-10-28 DIAGNOSIS — I2510 Atherosclerotic heart disease of native coronary artery without angina pectoris: Secondary | ICD-10-CM | POA: Diagnosis not present

## 2023-10-28 DIAGNOSIS — R221 Localized swelling, mass and lump, neck: Secondary | ICD-10-CM | POA: Diagnosis not present

## 2023-10-28 DIAGNOSIS — Z1732 Human epidermal growth factor receptor 2 negative status: Secondary | ICD-10-CM | POA: Diagnosis not present

## 2023-10-28 DIAGNOSIS — Z9221 Personal history of antineoplastic chemotherapy: Secondary | ICD-10-CM | POA: Diagnosis not present

## 2023-10-28 DIAGNOSIS — Z79899 Other long term (current) drug therapy: Secondary | ICD-10-CM | POA: Diagnosis not present

## 2023-10-28 DIAGNOSIS — Z882 Allergy status to sulfonamides status: Secondary | ICD-10-CM | POA: Diagnosis not present

## 2023-10-28 MED ORDER — ONDANSETRON HCL 8 MG PO TABS: 8.0000 mg | ORAL_TABLET | Freq: Three times a day (TID) | ORAL | 1 refills | Status: AC | PRN

## 2023-10-28 MED ORDER — LIDOCAINE-PRILOCAINE 2.5-2.5 % EX CREA: TOPICAL_CREAM | CUTANEOUS | 3 refills | Status: AC

## 2023-10-28 MED ORDER — PROCHLORPERAZINE MALEATE 10 MG PO TABS: 10.0000 mg | ORAL_TABLET | Freq: Four times a day (QID) | ORAL | 1 refills | Status: AC | PRN

## 2023-10-28 NOTE — Progress Notes (Signed)
 DISCONTINUE ON PATHWAY REGIMEN - Breast     A cycle is every 21 days:     Docetaxel       Cyclophosphamide    **Always confirm dose/schedule in your pharmacy ordering system**  PRIOR TREATMENT: [id number]: TC - Docetaxel  + Cyclophosphamide  q21 Days x 4 Cycles  START ON PATHWAY REGIMEN - Breast     Cycles 1 through 4: A cycle is every 21 days:     Pembrolizumab      Paclitaxel      Carboplatin      Filgrastim-xxxx    Cycles 5 through 8: A cycle is every 21 days:     Pembrolizumab      Doxorubicin      Cyclophosphamide       Pegfilgrastim -xxxx   **Always confirm dose/schedule in your pharmacy ordering system**  Patient Characteristics: Preoperative or Nonsurgical Candidate, M0 (Clinical Staging), Up to cT4c, Any N, M0, Neoadjuvant Therapy followed by Surgery, Invasive Disease, Chemotherapy, HER2 Negative, ER Negative, Platinum Therapy Indicated and Candidate for Checkpoint Inhibitor Therapeutic Status: Preoperative or Nonsurgical Candidate, M0 (Clinical Staging) AJCC M Category: cM0 AJCC Grade: G3 ER Status: Negative (-) AJCC 8 Stage Grouping: Unknown HER2 Status: Negative (-) AJCC T Category: cTX AJCC N Category: cN1 PR Status: Negative (-) Breast Surgical Plan: Neoadjuvant Therapy followed by Surgery Intent of Therapy: Curative Intent, Discussed with Patient

## 2023-10-28 NOTE — Progress Notes (Addendum)
 " [facility]   [phone] [phone]   Clinic Follow up Note   Patient Care Team: [name], MD as PCP - General (Internal Medicine) Glean Stephane BROCKS, RN (Inactive) as Oncology Nurse Navigator [name], RN as Oncology Nurse Navigator [name], MD as Consulting Physician (General Surgery) [name], MD as Consulting Physician (Hematology) [name], MD as Consulting Physician (Radiation Oncology) [name], MD as Consulting Physician (Endocrinology) [name], MD as Consulting Physician (Sports Medicine) [name], MD as Consulting Physician (Obstetrics and Gynecology) [name], [name], NP as Nurse Practitioner (Nurse Practitioner) Imaging, [facility] as Radiologist (Diagnostic Radiology) 10/28/2023  I connected with Samantha [name] on 10/28/23 at  8:40 AM EDT by telephone and verified that I am speaking with the correct person using two identifiers.   I discussed the limitations, risks, security and privacy concerns of performing an evaluation and management service by telephone and the availability of in person appointments. I also discussed with the patient that there may be a patient responsible charge related to this service. The patient expressed understanding and agreed to proceed.   Patient's location:  Home  Provider's location:  Office    CHIEF COMPLAINT: f/u recurrent breast cancer    CURRENT THERAPY: pending chemo   Oncology history Malignant neoplasm of upper-outer quadrant of right breast in female, estrogen receptor negative (HCC) IDC and DCIS, Stage IA, pT1b, cN0, triple negative, Grade 3 -found on screening mammogram. S/p lumpectomy on 01/08/22 with Dr. Aron, path showed: 8 mm invasive and in situ ductal carcinoma with negative margins. Repeat prognostic panel confirmed triple negative disease.  -lymph node biopsies 01/28/22 showed negative nodes (0/2). Port placed at time of  procedure. Postoperative course complicated by pneumothorax, which has resolved. -she began adjuvant TC on 02/20/22, and completed planned 4 cycles on 04/24/2022. -she completed adjuvant RT on 07/09/2022  -we discussed cancer surveillance and what to watch at home   Ductal carcinoma in situ (DCIS) of left breast -diagnosed in 12/05/2022, ER-/PR-, g2 -s/p left lumpectomy by Dr. Aron on January 01, 2023.  Surgical path showed intermediate grade DCIS with necrosis, margins were negative. -She has completed adjuvant radiation -No role for adjuvant antiestrogen therapy -Continue breast cancer surveillance  Assessment & Plan Recurrent metastatic breast cancer to chest nodes, triple negative  Recurrent metastatic breast cancer with local recurrence on the chest wall and lymph nodes, including a deep lymph node behind the pectoralis muscle. ER/PR and HER2 negative, consistent with recurrence of previous breast cancer diagnosis. The condition is more severe than the previous occurrence due to multiple lymph node involvement and deep chest wall location.  - Case reviewed in the breast tumor conference this morning, the consensus is neoadjuvant chemotherapy followed by surgery and possible consolidation radiation.   -The goal is to eliminate future recurrence, although there is still a high possibility of recurrence. She is relatively [place] and healthy, which is favorable for treatment outcomes. - Schedule port placement for chemotherapy.  - Initiate chemotherapy with carboplatin , paclitaxel , and immunotherapy Keytruda  every three weeks for three months, followed by Adriamycin  and Cytoxan  every three weeks for three months. - Monitor cardiac function with echocardiograms at baseline and after three cycles of Adriamycin . - Consider surgery to remove lymph nodes after chemotherapy (the lymph nodes behind pectoral muscle may not be removable). - Consider radiation therapy post-surgery if feasible - Discuss  potential side effects of chemotherapy, including fatigue, low appetite, nausea, alopecia,  neuropathy, and potential cardiac effects and small risk of leukemia from Adriamycin .  Patient with good understanding and agrees to proceed. - Refer to clinical trial department for potential trial participation. - Repeat PET scan mid-treatment to assess chemotherapy efficacy.  Hyperlipidemia Currently managed with Lipitor. No side effects reported from Lipitor. No impact on current cancer treatment plan. - Continue Lipitor as prescribed.  Plan - Case reviewed in breast tumor board this morning. - We recommend chemotherapy first, I recommend carboplatin , paclitaxel  every 3 weeks for 4 cycles, followed by Adriamycin  and Cytoxan  every 3 weeks for 4 cycles, along with Keytruda  every 3 weeks for 1 year - She is scheduled to see her surgeon Dr. Aron next Monday, will have port placement soon - Will see her with first cycle chemo, likely in 2 weeks. -echo in 1-2 weeks    SUMMARY OF ONCOLOGIC HISTORY: Oncology History Overview Note   Cancer Staging  Malignant neoplasm of upper-outer quadrant of right breast in female, estrogen receptor negative Staging form: Breast, AJCC 8th Edition - Clinical stage from 12/23/2021: cT1b, cM0, G3, ER-, PR-, HER2- - Unsigned Stage prefix: Initial diagnosis Histologic grading system: 3 grade system - Pathologic stage from 01/08/2022: Stage IB (pT1b, pN0, cM0, G3, ER-, PR-, HER2-) - Signed by [name], MD on 01/24/2022 Stage prefix: Initial diagnosis Histologic grading system: 3 grade system Residual tumor (R): R0 - None     Malignant neoplasm of upper-outer quadrant of right breast in female, estrogen receptor negative (HCC)  12/06/2021 Mammogram   CLINICAL DATA:  Patient returns today to evaluate RIGHT breast calcifications identified on a recent screening mammogram.   EXAM: DIGITAL DIAGNOSTIC UNILATERAL RIGHT MAMMOGRAM  IMPRESSION: Grouped coarse heterogeneous  calcifications within the upper-outer quadrant of the RIGHT breast, measuring 6 mm extent. These may be fibroadenomatous calcifications. Stereotactic biopsy is recommended to exclude malignancy.   12/18/2021 Initial Biopsy   Diagnosis Breast, right, needle core biopsy, upper outer quadrant, x clip - DUCTAL CARCINOMA IN SITU, SOLID AND CRIBRIFORM TYPE WITH COMEDONECROSIS AND ASSOCIATED CALCIFICATIONS, NUCLEAR GRADE 3 OF 3 - FOCAL MICROINVASION IS PRESENT (LESS THAN 1 MM) WITH EVIDENCE OF LYMPHOVASCULAR INVASION - NECROSIS: PRESENT - CALCIFICATIONS: PRESENT - DCIS LENGTH: 7 MM IN GREATEST LINEAR DIMENSION ON FRAGMENTED CORES  PROGNOSTIC INDICATORS Results: IMMUNOHISTOCHEMICAL AND MORPHOMETRIC ANALYSIS PERFORMED MANUALLY The tumor cells are NEGATIVE for Her2 (1+). Estrogen Receptor: 0%, NEGATIVE Progesterone Receptor: 0%, NEGATIVE Proliferation Marker Ki67: 60%   12/23/2021 Initial Diagnosis   Malignant neoplasm of upper-outer quadrant of right breast in female, estrogen receptor negative (HCC)   12/23/2021 Imaging   EXAM: ULTRASOUND OF THE RIGHT AXILLA  IMPRESSION: No abnormal appearing RIGHT axillary lymph nodes.   01/08/2022 Cancer Staging   Staging form: Breast, AJCC 8th Edition - Pathologic stage from 01/08/2022: Stage IB (pT1b, pN0, cM0, G3, ER-, PR-, HER2-) - Signed by [name], MD on 01/24/2022 Stage prefix: Initial diagnosis Histologic grading system: 3 grade system Residual tumor (R): R0 - None   02/20/2022 - 04/24/2022 Chemotherapy   Patient is on Treatment Plan : BREAST TC q21d     10/06/2022 Survivorship   SCP delivered by Lacie [name], NP   11/11/2023 -  Chemotherapy   Patient is on Treatment Plan : BREAST Pembrolizumab  (200) D1 + Carboplatin  (5) D1 + Paclitaxel  (80) D1,8,15 q21d X 4 cycles / Pembrolizumab  (200) D1 + AC D1 q21d x 4 cycles     Ductal carcinoma in situ (DCIS) of left breast  12/09/2022 Initial Diagnosis  Ductal carcinoma in situ (DCIS) of left breast    01/01/2023 Surgery   Let breast seed localized lumpectomy.with Dr. Aron  2.5 cm DCIS with negative margins.     02/11/2023 - 03/10/2023 Radiation Therapy   Dose per fraction - 2.66 Gy Prescribed dose (delivered/prescribed)  42.56/42.56 Prescribed Fxs (delivered/prescribed)  16/16  Boost  Dose per fraction -  2Gy Prescribed dose (delivered/prescribed) 8Gy/8Gy) Prescribed Fxs (delivered/prescribed) (4Gy/4Gy)       Discussed the use of AI scribe software for clinical note transcription with the patient, who gave verbal consent to proceed.  History of Present Illness Samantha [name] is a 63 year old female with recurrent metastatic breast cancer who presents for discussion of treatment plan.  She has recurrent breast cancer on her chest wall and in multiple lymph nodes, including axillary and chest wall nodes. The cancer is ERPR and HER2 negative, consistent with her previous diagnosis. Recent imaging shows no distant metastasis.  She previously underwent chemotherapy with a TC regimen and radiation therapy. She did not experience neuropathy from the chemotherapy and is not experiencing any neuropathy currently. Neulasta  was used during chemotherapy, initially causing bone pain, which resolved after administration adjustments.  She is currently on Lipitor for plaque buildup with no reported side effects.     REVIEW OF SYSTEMS:   Constitutional: Denies fevers, chills or abnormal weight loss Eyes: Denies blurriness of vision Ears, nose, mouth, throat, and face: Denies mucositis or sore throat Respiratory: Denies cough, dyspnea or wheezes Cardiovascular: Denies palpitation, chest discomfort or lower extremity swelling Gastrointestinal:  Denies nausea, heartburn or change in bowel habits Skin: Denies abnormal skin rashes Lymphatics: Denies new lymphadenopathy or easy bruising Neurological:Denies numbness, tingling or new weaknesses Behavioral/Psych: Mood is stable, no new changes   All other systems were reviewed with the patient and are negative.  MEDICAL HISTORY:  Past Medical History:  Diagnosis Date   Allergy    Anxiety 2015   Result of culmination of super stressful caregiving and deaths of 2 immediate family members.  Overall do pretty well.   Cancer [facility]) 2023   right breast   Coronary artery calcification    Depression    denies   GERD (gastroesophageal reflux disease)    Hyperlipidemia    Hypothyroidism    Right carpal tunnel syndrome 09/19/2019   Thyroid  disease     SURGICAL HISTORY: Past Surgical History:  Procedure Laterality Date   BREAST BIOPSY Left 12/04/2022   MM LT BREAST BX W LOC DEV 1ST LESION IMAGE BX SPEC STEREO GUIDE 12/04/2022 [facility]MO[facility]   BREAST BIOPSY  12/30/2022   MM LT RADIOACTIVE SEED LOC MAMMO GUIDE 12/30/2022 [facility]   BREAST LUMPECTOMY WITH RADIOACTIVE SEED LOCALIZATION Right 01/08/2022   Procedure: RIGHT BREAST LUMPECTOMY WITH RADIOACTIVE SEED LOCALIZATION;  Surgeon: [name], MD;  Location: [facility] OR;  Service: General;  Laterality: Right;   BREAST LUMPECTOMY WITH RADIOACTIVE SEED LOCALIZATION Left 01/01/2023   Procedure: LEFT BREAST SEED LOCALIZED LUMPECTOMY;  Surgeon: [name], MD;  Location: [facility] OR;  Service: General;  Laterality: Left;   PORT-A-CATH REMOVAL Left 05/20/2022   Procedure: REMOVAL PORT-A-CATH;  Surgeon: [name], MD;  Location: [facility];  Service: General;  Laterality: Left;   PORTACATH PLACEMENT Left 01/28/2022   Procedure: INSERTION PORT-A-CATH;  Surgeon: [name], MD;  Location: [facility];  Service: General;  Laterality: Left;   SENTINEL NODE BIOPSY Right 01/28/2022   Procedure: RIGHT SENTINEL LYMPH NODE BIOPSY;  Surgeon: Aron,  Jina, MD;  Location: [facility];  Service: General;  Laterality: Right;   TONSILLECTOMY      I have reviewed the social history and family history with the patient and they are unchanged from previous  note.  ALLERGIES:  is allergic to nortriptyline , singulair  [montelukast  sodium], sulfonamide derivatives, amitriptyline  hcl, esomeprazole, gabapentin , lexapro [escitalopram], and neosporin [bacitracin-polymyxin b].  MEDICATIONS:  Current Outpatient Medications  Medication Sig Dispense Refill   atorvastatin (LIPITOR) 20 MG tablet Take 20 mg by mouth daily.     betamethasone dipropionate 0.05 % cream Apply 1 Application topically 2 (two) times daily.     Calcium  Carb-Cholecalciferol (CALCIUM -VITAMIN D3) 250-125 MG-UNIT TABS Take 1 tablet by mouth daily.     Cholecalciferol (VITAMIN D3) 50 MCG (2000 UT) TABS Take 2,000 Units by mouth daily.     EPINEPHrine  0.3 mg/0.3 mL IJ SOAJ injection Inject 0.3 mg into the muscle as needed for anaphylaxis. 1 each 1   ezetimibe (ZETIA) 10 MG tablet 1 tablet Orally Once a day for 90 days     famotidine  (PEPCID ) 20 MG tablet Take 1 tablet (20 mg total) by mouth daily. (Patient taking differently: Take 20 mg by mouth daily as needed for heartburn or indigestion.)     Fezolinetant (VEOZAH) 45 MG TABS Take 45 mg by mouth daily.     Levothyroxine  Sodium 88 MCG CAPS Take 88 mcg by mouth See admin instructions. Take 88 mcg by mouth for two days and skip a day then repeat cycle.  10   lidocaine -prilocaine  (EMLA ) cream Apply to affected area once 30 g 3   methocarbamol  (ROBAXIN ) 500 MG tablet Take 1 tablet (500 mg total) by mouth 4 (four) times daily as needed for muscle spasms. 60 tablet 0   nystatin -triamcinolone (MYCOLOG II) cream Apply 1 Application topically 2 (two) times daily as needed (irritation).     ondansetron  (ZOFRAN ) 8 MG tablet Take 1 tablet (8 mg total) by mouth every 8 (eight) hours as needed for nausea or vomiting. Start on the third day after chemotherapy. 30 tablet 1   prochlorperazine  (COMPAZINE ) 10 MG tablet Take 1 tablet (10 mg total) by mouth every 6 (six) hours as needed for nausea or vomiting. 30 tablet 1   sertraline  (ZOLOFT ) 50 MG tablet Take  50 mg by mouth daily.     tacrolimus (PROTOPIC) 0.03 % ointment Apply 1 Application topically 2 (two) times daily.     TIROSINT  100 MCG CAPS Take 100 mcg by mouth every 3 (three) days.     No current facility-administered medications for this visit.    PHYSICAL EXAMINATION: Not performed   LABORATORY DATA:  I have reviewed the data as listed    Latest Ref Rng & Units 12/09/2022   12:02 PM 10/06/2022   11:04 AM 07/16/2022   11:24 AM  CBC  WBC 4.0 - 10.5 K/uL 8.4  5.2  4.7   Hemoglobin 12.0 - 15.0 g/dL 85.6  86.4  85.7   Hematocrit 36.0 - 46.0 % 43.4  41.6  43.4   Platelets 150 - 400 K/uL 270  246  239         Latest Ref Rng & Units 12/09/2022   12:02 PM 10/06/2022   11:04 AM 07/16/2022   11:24 AM  CMP  Glucose 70 - 99 mg/dL 867  848  890   BUN 8 - 23 mg/dL 14  15  14    Creatinine 0.44 - 1.00 mg/dL 9.32  9.27  9.43  Sodium 135 - 145 mmol/L 141  139  139   Potassium 3.5 - 5.1 mmol/L 3.9  3.9  3.9   Chloride 98 - 111 mmol/L 105  105  105   CO2 22 - 32 mmol/L 28  27  28    Calcium  8.9 - 10.3 mg/dL 9.9  9.5  9.6   Total Protein 6.5 - 8.1 g/dL 7.7  7.0  7.5   Total Bilirubin 0.3 - 1.2 mg/dL 0.4  0.5  0.4   Alkaline Phos 38 - 126 U/L 113  106  107   AST 15 - 41 U/L 21  22  20    ALT 0 - 44 U/L 23  23  18        RADIOGRAPHIC STUDIES: I have personally reviewed the radiological images as listed and agreed with the findings in the report. No results found.     I discussed the assessment and treatment plan with the patient. The patient was provided an opportunity to ask questions and all were answered. The patient agreed with the plan and demonstrated an understanding of the instructions.   The patient was advised to call back or seek an in-person evaluation if the symptoms worsen or if the condition fails to improve as anticipated.  I provided 40 minutes of non face-to-face telephone visit time during this encounter, including review of chart and various tests results,  discussions about plan of care and coordination of care plan.    Onita Mattock, MD 10/28/23     "

## 2023-10-28 NOTE — Assessment & Plan Note (Signed)
-  diagnosed in 12/05/2022, ER-/PR-, g2 -s/p left lumpectomy by Dr. Donell Beers on January 01, 2023.  Surgical path showed intermediate grade DCIS with necrosis, margins were negative. -She has completed adjuvant radiation -No role for adjuvant antiestrogen therapy -Continue breast cancer surveillance

## 2023-10-28 NOTE — Assessment & Plan Note (Signed)
 IDC and DCIS, Stage IA, pT1b, cN0, triple negative, Grade 3 -found on screening mammogram. S/p lumpectomy on 01/08/22 with Dr. Donell Beers, path showed: 8 mm invasive and in situ ductal carcinoma with negative margins. Repeat prognostic panel confirmed triple negative disease.  -lymph node biopsies 01/28/22 showed negative nodes (0/2). Port placed at time of procedure. Postoperative course complicated by pneumothorax, which has resolved. -she began adjuvant TC on 02/20/22, and completed planned 4 cycles on 04/24/2022. -she completed adjuvant RT on 07/09/2022  -we discussed cancer surveillance and what to watch at home

## 2023-10-29 ENCOUNTER — Encounter: Payer: Self-pay | Admitting: Hematology

## 2023-10-29 ENCOUNTER — Ambulatory Visit: Admitting: Sports Medicine

## 2023-10-29 ENCOUNTER — Other Ambulatory Visit: Payer: Self-pay

## 2023-10-29 VITALS — BP 135/70 | Ht 67.5 in | Wt 171.0 lb

## 2023-10-29 DIAGNOSIS — M545 Low back pain, unspecified: Secondary | ICD-10-CM | POA: Diagnosis not present

## 2023-10-29 DIAGNOSIS — M5416 Radiculopathy, lumbar region: Secondary | ICD-10-CM

## 2023-10-29 MED ORDER — METHYLPREDNISOLONE ACETATE 40 MG/ML IJ SUSP
80.0000 mg | Freq: Once | INTRAMUSCULAR | Status: AC
  Administered 2023-10-29: 80 mg via INTRAMUSCULAR

## 2023-10-29 MED ORDER — DIAZEPAM 5 MG PO TABS: 5.0000 mg | ORAL_TABLET | Freq: Four times a day (QID) | ORAL | 1 refills | Status: DC | PRN

## 2023-10-29 NOTE — Assessment & Plan Note (Signed)
 Today because of her significant pain which she rates at a 7 of 10 we gave her an injection of 80 mg of Solu-Medrol .  She cannot take NSAIDs.  She will continue on Tylenol  3-4 times a day.  Begin some easy motion exercises for her back and some flexion exercises while seated in a chair.  She needs to prioritize her chemotherapy treatment plan but will return to see me in 4 to 6 weeks if needed

## 2023-10-29 NOTE — Progress Notes (Signed)
 Chief complaint low back pain  Patient has seen me intermittently in the past for low back pain.  In May 2024 I ordered x-rays which showed some Antero listhesis in the lower lumbar area as well as degenerative disc disease.  She does some flexion exercises for her back and generally has been doing okay regarding back pain.  However this was flared over the last week and is quite painful and so she comes for evaluation.  She was seen by oncology yesterday and unfortunately has a metastatic lymph node from her breast cancer.  She will require chemotherapy for this.  She notes that a recent PET scan did not show any uptake in the bones to suggest any bony metastatic disease.  Physical exam Pleasant female who looks in some discomfort and a bit depressed BP 135/70   Ht 5' 7.5 (1.715 m)   Wt 171 lb (77.6 kg)   BMI 26.39 kg/m  She has tenderness to palpation in the lower lumbar area that goes all the way down to her distal sacrum She can do toe walk She can do heel walk She can do straight leg raise bilaterally without sciatic irritation Back extension fairly quickly causes pain Lumbar flexion allows her to go about 80 degrees before pain Some pain with lateral bending and with rotation

## 2023-10-30 ENCOUNTER — Encounter: Payer: Self-pay | Admitting: *Deleted

## 2023-10-30 ENCOUNTER — Other Ambulatory Visit: Payer: Self-pay

## 2023-10-30 ENCOUNTER — Encounter: Payer: Self-pay | Admitting: Sports Medicine

## 2023-10-30 ENCOUNTER — Ambulatory Visit (HOSPITAL_COMMUNITY)
Admission: RE | Admit: 2023-10-30 | Discharge: 2023-10-30 | Disposition: A | Discharge status: Home / Self Care | Source: Ambulatory Visit | Attending: Hematology | Admitting: Hematology

## 2023-10-30 ENCOUNTER — Telehealth: Payer: Self-pay

## 2023-10-30 DIAGNOSIS — I358 Other nonrheumatic aortic valve disorders: Secondary | ICD-10-CM | POA: Diagnosis not present

## 2023-10-30 DIAGNOSIS — C50411 Malignant neoplasm of upper-outer quadrant of right female breast: Secondary | ICD-10-CM | POA: Diagnosis not present

## 2023-10-30 DIAGNOSIS — Z171 Estrogen receptor negative status [ER-]: Secondary | ICD-10-CM | POA: Insufficient documentation

## 2023-10-30 DIAGNOSIS — Z0189 Encounter for other specified special examinations: Secondary | ICD-10-CM | POA: Diagnosis not present

## 2023-10-30 DIAGNOSIS — Z08 Encounter for follow-up examination after completed treatment for malignant neoplasm: Secondary | ICD-10-CM | POA: Insufficient documentation

## 2023-10-30 LAB — ECHOCARDIOGRAM COMPLETE
Area-P 1/2: 5.02 cm2
Calc EF: 73.1 %
S' Lateral: 2.5 cm
Single Plane A2C EF: 71.1 %
Single Plane A4C EF: 74.9 %

## 2023-10-30 LAB — GUARDANT REVEAL

## 2023-10-30 NOTE — Telephone Encounter (Signed)
 LVM stating that Dr. Lanny has reviewed the pt's recent Guardant Reveal results.  Stated that the Guardant Reveal was positive for circulating tumor DNA w/a tumor fraction os 0.195%.  Stated this result is conclusive with the pt's recent PET Scan and biopsy results.  Instructed pt to contact Dr. Demetra office should she have additional questions or concerns.

## 2023-11-01 NOTE — Assessment & Plan Note (Signed)
 IDC and DCIS, Stage IA, pT1b, cN0, triple negative, Grade 3 -found on screening mammogram. S/p lumpectomy on 01/08/22 with Dr. Aron, path showed: 8 mm invasive and in situ ductal carcinoma with negative margins. Repeat prognostic panel confirmed triple negative disease.  -lymph node biopsies 01/28/22 showed negative nodes (0/2). Port placed at time of procedure. Postoperative course complicated by pneumothorax, which has resolved. -she began adjuvant TC on 02/20/22, and completed planned 4 cycles on 04/24/2022. -she completed adjuvant RT on 07/09/2022  -biopsy confirmed chest lymph nodes (axilla and chest wall including posteriad pectoralis muscle)  recurrence in 10/2023

## 2023-11-02 ENCOUNTER — Other Ambulatory Visit: Payer: Self-pay

## 2023-11-02 ENCOUNTER — Telehealth: Payer: Self-pay | Admitting: *Deleted

## 2023-11-02 ENCOUNTER — Ambulatory Visit (HOSPITAL_BASED_OUTPATIENT_CLINIC_OR_DEPARTMENT_OTHER): Admitting: Hematology

## 2023-11-02 ENCOUNTER — Encounter (HOSPITAL_BASED_OUTPATIENT_CLINIC_OR_DEPARTMENT_OTHER): Payer: Self-pay | Admitting: General Surgery

## 2023-11-02 ENCOUNTER — Encounter: Payer: Self-pay | Admitting: Allergy

## 2023-11-02 DIAGNOSIS — Z1732 Human epidermal growth factor receptor 2 negative status: Secondary | ICD-10-CM | POA: Diagnosis not present

## 2023-11-02 DIAGNOSIS — C50411 Malignant neoplasm of upper-outer quadrant of right female breast: Secondary | ICD-10-CM

## 2023-11-02 DIAGNOSIS — C50112 Malignant neoplasm of central portion of left female breast: Secondary | ICD-10-CM | POA: Diagnosis not present

## 2023-11-02 DIAGNOSIS — Z171 Estrogen receptor negative status [ER-]: Secondary | ICD-10-CM

## 2023-11-02 DIAGNOSIS — R911 Solitary pulmonary nodule: Secondary | ICD-10-CM | POA: Diagnosis not present

## 2023-11-02 DIAGNOSIS — H9201 Otalgia, right ear: Secondary | ICD-10-CM | POA: Diagnosis not present

## 2023-11-02 DIAGNOSIS — Z882 Allergy status to sulfonamides status: Secondary | ICD-10-CM | POA: Diagnosis not present

## 2023-11-02 DIAGNOSIS — N6331 Unspecified lump in axillary tail of the right breast: Secondary | ICD-10-CM | POA: Diagnosis not present

## 2023-11-02 DIAGNOSIS — Z79899 Other long term (current) drug therapy: Secondary | ICD-10-CM | POA: Diagnosis not present

## 2023-11-02 DIAGNOSIS — Z888 Allergy status to other drugs, medicaments and biological substances status: Secondary | ICD-10-CM | POA: Diagnosis not present

## 2023-11-02 DIAGNOSIS — C50911 Malignant neoplasm of unspecified site of right female breast: Secondary | ICD-10-CM | POA: Diagnosis not present

## 2023-11-02 DIAGNOSIS — I2510 Atherosclerotic heart disease of native coronary artery without angina pectoris: Secondary | ICD-10-CM | POA: Diagnosis not present

## 2023-11-02 DIAGNOSIS — Z1722 Progesterone receptor negative status: Secondary | ICD-10-CM | POA: Diagnosis not present

## 2023-11-02 DIAGNOSIS — C773 Secondary and unspecified malignant neoplasm of axilla and upper limb lymph nodes: Secondary | ICD-10-CM | POA: Diagnosis not present

## 2023-11-02 DIAGNOSIS — Z9221 Personal history of antineoplastic chemotherapy: Secondary | ICD-10-CM | POA: Diagnosis not present

## 2023-11-02 DIAGNOSIS — Z79621 Long term (current) use of calcineurin inhibitor: Secondary | ICD-10-CM | POA: Diagnosis not present

## 2023-11-02 DIAGNOSIS — Z9089 Acquired absence of other organs: Secondary | ICD-10-CM | POA: Diagnosis not present

## 2023-11-02 DIAGNOSIS — R221 Localized swelling, mass and lump, neck: Secondary | ICD-10-CM | POA: Diagnosis not present

## 2023-11-02 DIAGNOSIS — R410 Disorientation, unspecified: Secondary | ICD-10-CM | POA: Diagnosis not present

## 2023-11-02 DIAGNOSIS — D0512 Intraductal carcinoma in situ of left breast: Secondary | ICD-10-CM | POA: Diagnosis not present

## 2023-11-02 DIAGNOSIS — R110 Nausea: Secondary | ICD-10-CM | POA: Diagnosis not present

## 2023-11-02 DIAGNOSIS — Z923 Personal history of irradiation: Secondary | ICD-10-CM | POA: Diagnosis not present

## 2023-11-02 NOTE — H&P (Signed)
 PROVIDER:  JINA CLAIR NEPHEW, MD Patient Care Team: Shayne Oneil LABOR, MD as PCP - General (Internal Medicine) [name], MD (Hematology and Oncology) Dewey Norleen Hamilton, MD (Radiation Oncology) Sebastian Norman Hamilton, MD (Endocrinology) NEPHEW JINA CLAIR, MD as Consulting Provider (Surgical Oncology) Latisha Truman CROME, MD (Obstetrics and Gynecology)   MRN: [id number] DOB: 04-Dec-1960 DATE OF ENCOUNTER: 11/02/2023 Plan Chief Complaint: New Problem  ( Rt axilla lymph node met CA - referred by [facility]/Dr. Oneil Shayne)       History of Present Illness: Samantha [name] is a 63 y.o. female who is seen today for breast cancer follow up.   Initial history:    Pt presented with a new diagnosis of RIGHT breast cancer 12/2021.  She had screening detected calcifications.  Diagnostic imaging confirmed this. It showed 6 mm of calcs in the UOQ. Core needle biopsy was performed.  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  This was ER/PR negative and enough was present for her 2 testing which was negative.  Ki 67 was 60%.     Pt had no h/o cancer before this.  She has no definitive family cancer history, but a maternal great uncle may have had brain cancer.   She helps care for 3 of her grandkids below 95 years old 3 days per week.       She had a right seed loc lumpectomy 01/08/2022.  Unfortunately she had 8 mm of invasive cancer that was triple negative.  She underwent SLN bx and port 01/28/2022.  She got a moderate PTX on the left and required chest tube for around 5-6 days as her lung kept dropping.     She had port removal 05/20/2022. She had minimal pain. She completed XRT and got chemo.      She had had diagnostic mammography at her normal time and was found to have 3 mm of calcifications on the left.  This led to core needle biopsy which showed hormone negative intermediate grade DCIS.  So now a new diagnosis of LEFT breast cancer August 2024.   Pt underwent left breast seed localized  lumpectomy 01/01/2023.  Margins were negative with final size of DCIS 2.5 cm.  Patient only required 1 pain pill.  She otherwise just had some Tylenol .  She is doing well.   Interval history:    Patient had a cardiac CT of performed by her primary care physician.  This showed a lung nodule.  Because of her history of breast cancer, patient had a PET scan that was done for follow-up of the lung nodule.  Unfortunately, the PET scan showed some hypermetabolic lymph nodes.  She subsequently had diagnostic mammogram and ultrasound.  There were at least 2 abnormal right retropectoral lymph nodes.  On the PET scan, 1 of these hypermetabolic lymph nodes was very high above the subclavian.  Biopsy of 1 of these lymph nodes was positive for metastatic carcinoma.   Patient is asymptomatic.  Guardant reveal was done and was positive.   Of note, patient did have a pneumothorax with her last port.     PET scan 10/23/23 IMPRESSION: Three mildly hypermetabolic lymph nodes identified along the right chest wall along the subclavian region and pectoralis muscle region. Metastatic nodes are possible. Please correlate with any prior chest CT or dedicated workup further when clinically appropriate.  No abnormal lung uptake identified. Images from the prior study describing a left-sided perihilar lung nodule are not available. There appears to be a  nodular area posterior to the left main bronchus at the left hilum which does not show abnormal uptake. It is uncertain if this is the same lesion described on the prior study. This lesion today has some macroscopic fat could be a benign focus. However would recommend direct comparison to the prior examination. These films can be made available an addendum would gladly be made to compare   Dx mammogram/us  10/23/23 ACR Breast Density Category b: There are scattered areas of fibroglandular density.  FINDINGS: There is density and architectural distortion within the  RIGHT breast, corresponding to the site of prior lumpectomy. Spot compression magnification view(s) demonstrate no evidence of recurrent malignancy. Findings are stable compared to prior.  There is new density and architectural distortion within the LEFT breast, corresponding to the site of interval lumpectomy. Spot compression magnification views demonstrate no evidence of recurrent malignancy. This study will serve as a new baseline for future comparison.  No RIGHT axillary lymph nodes are seen via mammography.  Complete RIGHT axillary ultrasound was performed. Superior to the site of sentinel node biopsy, there are a few normal level 1 axillary nodes. Representative pictures were taken.  More medially, at least 2 abnormal retropectoral lymph nodes were identified, with the larger measuring 17 x 9 x 12 mm. This? Likely correlates with the hypermetabolic Rotter node seen on recent PET-CT.  IMPRESSION: 1. At least 2 abnormal RIGHT retropectoral lymph nodes, correlating with the hypermetabolic lymph node seen on PET-CT. Biopsy of the larger node was subsequently performed and is separately dictated.  2.  No mammographic evidence of malignancy in EITHER breast.  RECOMMENDATION: Ultrasound-guided biopsy of the RIGHT axillary lymph node. Diagnostic mammogram of the BILATERAL breasts in 1 year.  I have discussed the findings and recommendations with the patient. If applicable, a reminder letter will be sent to the patient regarding the next appointment.   Pathology 10/23/23 1. Lymph node, needle/core biopsy, right axilla, hydromark clip :       METASTATIC CARCINOMA.       METASTATIC FOCUS: 8.5 MM.       FOCAL LIMITED LYMPHOID TISSUE ON THE CAPSULE.        Review of Systems: A complete review of systems was obtained from the patient.  I have reviewed this information and discussed as appropriate with the patient.  See HPI as well for other ROS.   ROS      Medical History: Past  Medical History      Past Medical History:  Diagnosis Date   Anxiety     Arthritis     GERD (gastroesophageal reflux disease)     Thyroid  disease          Problem List     Patient Active Problem List  Diagnosis   Malignant neoplasm of upper-outer quadrant of right breast in female, estrogen receptor negative (CMS/HHS-HCC)   Carpal tunnel syndrome   Cavus deformity of foot, acquired   Degenerative arthritis of lumbar spine   Degenerative disc disease, cervical   Intraductal carcinoma in situ of breast   Osteoarthritis, hand   Primary hypothyroidism   Sensorineural hearing loss (SNHL) of right ear with unrestricted hearing of left ear   Cervical radiculopathy at C8   Heartburn   Nervousness   Anxiousness   Malignant neoplasm of central portion of left breast in female, estrogen receptor negative (CMS/HHS-HCC)   Recurrent breast cancer, right (CMS/HHS-HCC)        Past Surgical History  Past Surgical History:  Procedure Laterality Date   TONSILLECTOMY   1981   RIGHT BREAST LUMPECTOMY WITH RADIOACTIVE SEED LOCALIZATION   01/08/2022    Dr. Aron   RIGHT SENTINEL LYMPH NODE BIOPSY (Right: Axilla)  INSERTION PORT-A-CATH    01/28/2022    Dr. Aron   Removal port a cath   05/20/2022    Dr. Aron        Allergies       Allergies  Allergen Reactions   Montelukast  Sodium Other (See Comments)      Depressed mood   Nortriptyline  Unknown      depression   Omeprazole Hives   Sulfa (Sulfonamide Antibiotics) Nausea And Vomiting   Mold Other (See Comments)   Neomycin Other (See Comments)   Other Other (See Comments)   Pseudoephedrine Hcl Other (See Comments)   Esomeprazole Other (See Comments) and Rash   Gabapentin  Nausea        Medications Ordered Prior to Encounter        Current Outpatient Medications on File Prior to Visit  Medication Sig Dispense Refill   calcium  carbonate-vitamin D3 250 mg-3.125 mcg (125 unit) tablet Take 1 tablet by mouth once daily        cholecalciferol (VITAMIN D3) 2,000 unit capsule Take 2,000 Units by mouth once daily       diazePAM  (VALIUM ) 5 MG tablet Take 5 mg by mouth       levothyroxine  (TIROSINT ) 100 mcg Cap Take 100 mcg by mouth once a week Pt takes this medication once a week       levothyroxine  88 mcg Cap Take 88 mcg by mouth once daily Pt takes this medication 6 days a week       sertraline  (ZOLOFT ) 50 MG tablet Take 50 mg by mouth once daily       sertraline  (ZOLOFT ) 25 MG tablet Take 25 mg by mouth once daily        No current facility-administered medications on file prior to visit.        Family History       Family History  Problem Relation Age of Onset   Hyperlipidemia (Elevated cholesterol) Father          Tobacco Use History  Social History       Tobacco Use  Smoking Status Never  Smokeless Tobacco Never        Social History  Social History         Socioeconomic History   Marital status: Married  Tobacco Use   Smoking status: Never   Smokeless tobacco: Never  Vaping Use   Vaping status: Never Used  Substance and Sexual Activity   Alcohol use: Yes      Comment: very rare, less than once a month   Drug use: Never   Sexual activity: Defer    Social Drivers of Health        Food Insecurity: No Food Insecurity (01/28/2023)    Received from [facility]    Hunger Vital Sign     Within the past 12 months, you worried that your food would run out before you got the money to buy more.: Never true     Within the past 12 months, the food you bought just didn't last and you didn't have money to get more.: Never true  Transportation Needs: No Transportation Needs (01/28/2023)    Received from [facility] - [facility]     Lack of Transportation (  Medical): No     Lack of Transportation (Non-Medical): No    Received from [facility]    Social Network  Housing Stability: Unknown (11/02/2023)    Housing Stability Vital Sign     Homeless in the Last Year: No         Objective:          Vitals:    11/02/23 1426 11/02/23 1427  BP: 127/80    Pulse: (!) 120    Temp: 37.1 C (98.7 F)    TempSrc: Temporal    SpO2: 98%    Weight: 76.9 kg (169 lb 9.6 oz)    Height: 171.5 cm (5' 7.5)    PainSc:   0-No pain    Body mass index is 26.17 kg/m.   Head:   Normocephalic and atraumatic.  Eyes:    Conjunctivae are normal. Pupils are equal, round, and reactive to light. No scleral icterus.  Neck:   Normal range of motion. Neck supple. No tracheal deviation present. No thyromegaly present.  Resp:No respiratory distress, normal effort. Breast: Patient still has some mild left breast lymphedema and some staining of her mag trace on the left nipple.  There is no palpable lymphadenopathy.  Left breast has no appreciable lymphedema Abd:      Abdomen is soft, non distended and non tender. No masses are palpable.  There is no rebound and no guarding.  Neurological: Alert and oriented to person, place, and time. Coordination normal.  Skin:    Skin is warm and dry. No rash noted. No diaphoretic. No erythema. No pallor.  Psychiatric: Normal mood and affect. Normal behavior. Judgment and thought content normal.      Labs, Imaging and Diagnostic Testing:   No new labs     Assessment and Plan:    Assessment Diagnoses and all orders for this visit:   Malignant neoplasm of upper-outer quadrant of right breast in female, estrogen receptor negative (CMS/HHS-HCC)   Malignant neoplasm of central portion of left breast in female, estrogen receptor negative (CMS/HHS-HCC)   Recurrent breast cancer, right (CMS/HHS-HCC)     Fortunately, these new hypermetabolic lymph nodes are not necessarily within the reach of an axillary lymph node dissection.  The lymph node biopsy is probably reachable, but the retropectoral nodes and the high ones are not within reach of an axillary lymph node dissection.  We will place a port.  I will plan to do a left IJ port so we can keep the  same port pocket.  Left IJ will have lower risk of pneumothorax.   We are going to move this to [facility] day due to patient request.

## 2023-11-02 NOTE — Telephone Encounter (Signed)
 Left message for a return phone call to discuss process/steps for acquiring the dignicap.

## 2023-11-02 NOTE — Progress Notes (Signed)
 [facility]   [phone] [phone]   Clinic Follow up Note   Patient Care Team: [name], MD as PCP - General (Internal Medicine) Glean Stephane BROCKS, RN (Inactive) as Oncology Nurse Navigator [name], RN as Oncology Nurse Navigator [name], MD as Consulting Physician (General Surgery) [name], MD as Consulting Physician (Hematology) [name], MD as Consulting Physician (Radiation Oncology) [name], MD as Consulting Physician (Endocrinology) [name], MD as Consulting Physician (Sports Medicine) [name], MD as Consulting Physician (Obstetrics and Gynecology) [name], [name], NP as Nurse Practitioner (Nurse Practitioner) Imaging, [facility] as Radiologist (Diagnostic Radiology) 11/02/2023  I connected with Samantha [name] on 11/02/23 at 12:00 PM EDT by telephone and verified that I am speaking with the correct person using two identifiers.   I discussed the limitations, risks, security and privacy concerns of performing an evaluation and management service by telephone and the availability of in person appointments. I also discussed with the patient that there may be a patient responsible charge related to this service. The patient expressed understanding and agreed to proceed.   Patient's location:  Home  Provider's location:  Office    CHIEF COMPLAINT: Follow-up recurrent breast cancer   CURRENT THERAPY: Pending chemotherapy  Oncology history Malignant neoplasm of upper-outer quadrant of right breast in female, estrogen receptor negative (HCC) IDC and DCIS, Stage IA, pT1b, cN0, triple negative, Grade 3 -found on screening mammogram. S/p lumpectomy on 01/08/22 with Dr. Aron, path showed: 8 mm invasive and in situ ductal carcinoma with negative margins. Repeat prognostic panel confirmed triple negative disease.  -lymph node biopsies 01/28/22 showed negative nodes (0/2). Port placed at  time of procedure. Postoperative course complicated by pneumothorax, which has resolved. -she began adjuvant TC on 02/20/22, and completed planned 4 cycles on 04/24/2022. -she completed adjuvant RT on 07/09/2022  -biopsy confirmed chest lymph nodes (axilla and chest wall including posteriad pectoralis muscle)  recurrence in 10/2023   Assessment & Plan Recurrent triple negative breast cancer Recurrent triple negative breast cancer located on the chest wall behind the muscle, making surgical intervention challenging. Chemotherapy is prioritized to reduce tumor size and potentially cure the cancer. Echocardiogram results indicate normal heart function, which is important for monitoring potential cardiotoxicity from future chemotherapy drugs like doxorubicin. Upfront surgery is not currently recommended due to the tumor's location, but will attempt after chemotherapy if she has good response.  Adjuvant radiation will also be considered. the cure rate is not very high, unlike the first occurrence of cancer. -I discussed the detail scheduled about the chemotherapy.  She will receive carboplatin and Pembro every 3 weeks for 4 cycles, along with Taxol weekly, then changed to Adriamycin and Cytoxan  every 3 weeks for 2 cycles.  Pembro will continue for 1 year. - Schedule port placement on November 09, 2023. - Administer carboplatin every three weeks and Taxol weekly for twelve weeks. - Administer growth factor injections after the third week of chemotherapy. - Coordinate with Nanetta to provide information on DigniCap and assist with ordering. - Follow up on immunotherapy test PD-L1 results. - Schedule echocardiogram after three cycles of doxorubicin to monitor heart function. - Continue immunotherapy for one year after chemotherapy completion. - Maintain port for two to three years for ongoing treatment. - She is scheduled to see Dr. Con for further surgical evaluation.   Plan - I answered her questions about  the detailed chemo schedule, and potential side effects and  management.  She voiced good understanding and agrees to proceed - Will get more thickening Information for her, she is interested in using it during chemo - She is scheduled to see her surgeon Dr. Aron later today, port placement next Monday.  Plan to start chemo after port placement -f/u with first cycle chemo     SUMMARY OF ONCOLOGIC HISTORY: Oncology History Overview Note   Cancer Staging  Malignant neoplasm of upper-outer quadrant of right breast in female, estrogen receptor negative Staging form: Breast, AJCC 8th Edition - Clinical stage from 12/23/2021: cT1b, cM0, G3, ER-, PR-, HER2- - Unsigned Stage prefix: Initial diagnosis Histologic grading system: 3 grade system - Pathologic stage from 01/08/2022: Stage IB (pT1b, pN0, cM0, G3, ER-, PR-, HER2-) - Signed by [name], MD on 01/24/2022 Stage prefix: Initial diagnosis Histologic grading system: 3 grade system Residual tumor (R): R0 - None     Malignant neoplasm of upper-outer quadrant of right breast in female, estrogen receptor negative (HCC)  12/06/2021 Mammogram   CLINICAL DATA:  Patient returns today to evaluate RIGHT breast calcifications identified on a recent screening mammogram.   EXAM: DIGITAL DIAGNOSTIC UNILATERAL RIGHT MAMMOGRAM  IMPRESSION: Grouped coarse heterogeneous calcifications within the upper-outer quadrant of the RIGHT breast, measuring 6 mm extent. These may be fibroadenomatous calcifications. Stereotactic biopsy is recommended to exclude malignancy.   12/18/2021 Initial Biopsy   Diagnosis Breast, right, needle core biopsy, upper outer quadrant, x clip - DUCTAL CARCINOMA IN SITU, SOLID AND CRIBRIFORM TYPE WITH COMEDONECROSIS AND ASSOCIATED CALCIFICATIONS, NUCLEAR GRADE 3 OF 3 - FOCAL MICROINVASION IS PRESENT (LESS THAN 1 MM) WITH EVIDENCE OF LYMPHOVASCULAR INVASION - NECROSIS: PRESENT - CALCIFICATIONS: PRESENT - DCIS LENGTH: 7 MM IN GREATEST  LINEAR DIMENSION ON FRAGMENTED CORES  PROGNOSTIC INDICATORS Results: IMMUNOHISTOCHEMICAL AND MORPHOMETRIC ANALYSIS PERFORMED MANUALLY The tumor cells are NEGATIVE for Her2 (1+). Estrogen Receptor: 0%, NEGATIVE Progesterone Receptor: 0%, NEGATIVE Proliferation Marker Ki67: 60%   12/23/2021 Initial Diagnosis   Malignant neoplasm of upper-outer quadrant of right breast in female, estrogen receptor negative (HCC)   12/23/2021 Imaging   EXAM: ULTRASOUND OF THE RIGHT AXILLA  IMPRESSION: No abnormal appearing RIGHT axillary lymph nodes.   01/08/2022 Cancer Staging   Staging form: Breast, AJCC 8th Edition - Pathologic stage from 01/08/2022: Stage IB (pT1b, pN0, cM0, G3, ER-, PR-, HER2-) - Signed by [name], MD on 01/24/2022 Stage prefix: Initial diagnosis Histologic grading system: 3 grade system Residual tumor (R): R0 - None   02/20/2022 - 04/24/2022 Chemotherapy   Patient is on Treatment Plan : BREAST TC q21d     10/06/2022 Survivorship   SCP delivered by Lacie [name], NP   11/11/2023 -  Chemotherapy   Patient is on Treatment Plan : BREAST Pembrolizumab (200) D1 + Carboplatin (5) D1 + Paclitaxel (80) D1,8,15 q21d X 4 cycles / Pembrolizumab (200) D1 + AC D1 q21d x 4 cycles     Ductal carcinoma in situ (DCIS) of left breast  12/09/2022 Initial Diagnosis   Ductal carcinoma in situ (DCIS) of left breast   01/01/2023 Surgery   Let breast seed localized lumpectomy.with Dr. Aron  2.5 cm DCIS with negative margins.     02/11/2023 - 03/10/2023 Radiation Therapy   Dose per fraction - 2.66 Gy Prescribed dose (delivered/prescribed)  42.56/42.56 Prescribed Fxs (delivered/prescribed)  16/16  Boost  Dose per fraction -  2Gy Prescribed dose (delivered/prescribed) 8Gy/8Gy) Prescribed Fxs (delivered/prescribed) (4Gy/4Gy)       Discussed the use of AI scribe software for clinical  note transcription with the patient, who gave verbal consent to proceed.  History of Present Illness KATERA RYBKA is a 63 year old female with recurrent triple negative breast cancer who presents for a follow-up phone visit.  She is scheduled for port placement on November 09, 2023, in preparation for chemotherapy. There is confusion regarding the chemotherapy infusion schedule, initially thought to start post-port placement. She will receive short-acting growth factor injections to support treatment and is concerned about side effects, planning to use Claritin for management. She is interested in using the DigniCap to manage hair loss during chemotherapy and is awaiting information on obtaining it. She is also awaiting results for a test related to immunotherapy eligibility for her triple negative breast cancer. A written explanation of her chemotherapy schedule and infusion details is needed for better understanding of her treatment plan.     REVIEW OF SYSTEMS:   Constitutional: Denies fevers, chills or abnormal weight loss Eyes: Denies blurriness of vision Ears, nose, mouth, throat, and face: Denies mucositis or sore throat Respiratory: Denies cough, dyspnea or wheezes Cardiovascular: Denies palpitation, chest discomfort or lower extremity swelling Gastrointestinal:  Denies nausea, heartburn or change in bowel habits Skin: Denies abnormal skin rashes Lymphatics: Denies new lymphadenopathy or easy bruising Neurological:Denies numbness, tingling or new weaknesses Behavioral/Psych: Mood is stable, no new changes  All other systems were reviewed with the patient and are negative.  MEDICAL HISTORY:  Past Medical History:  Diagnosis Date   Allergy    Anxiety 2015   Result of culmination of super stressful caregiving and deaths of 2 immediate family members.  Overall do pretty well.   Cancer [facility]) 2023   right breast   Coronary artery calcification    Depression    denies   GERD (gastroesophageal reflux disease)    Hyperlipidemia    Hypothyroidism    Right carpal tunnel syndrome 09/19/2019    Thyroid  disease     SURGICAL HISTORY: Past Surgical History:  Procedure Laterality Date   BREAST BIOPSY Left 12/04/2022   MM LT BREAST BX W LOC DEV 1ST LESION IMAGE BX SPEC STEREO GUIDE 12/04/2022 [facility]   BREAST BIOPSY  12/30/2022   MM LT RADIOACTIVE SEED LOC MAMMO GUIDE 12/30/2022 [facility]   BREAST LUMPECTOMY WITH RADIOACTIVE SEED LOCALIZATION Right 01/08/2022   Procedure: RIGHT BREAST LUMPECTOMY WITH RADIOACTIVE SEED LOCALIZATION;  Surgeon: [name], MD;  Location: [facility] OR;  Service: General;  Laterality: Right;   BREAST LUMPECTOMY WITH RADIOACTIVE SEED LOCALIZATION Left 01/01/2023   Procedure: LEFT BREAST SEED LOCALIZED LUMPECTOMY;  Surgeon: [name], MD;  Location: [facility] OR;  Service: General;  Laterality: Left;   PORT-A-CATH REMOVAL Left 05/20/2022   Procedure: REMOVAL PORT-A-CATH;  Surgeon: [name], MD;  Location: [facility];  Service: General;  Laterality: Left;   PORTACATH PLACEMENT Left 01/28/2022   Procedure: INSERTION PORT-A-CATH;  Surgeon: [name], MD;  Location: [facility];  Service: General;  Laterality: Left;   SENTINEL NODE BIOPSY Right 01/28/2022   Procedure: RIGHT SENTINEL LYMPH NODE BIOPSY;  Surgeon: [name], MD;  Location: [facility];  Service: General;  Laterality: Right;   TONSILLECTOMY      I have reviewed the social history and family history with the patient and they are unchanged from previous note.  ALLERGIES:  is allergic to nortriptyline , singulair  [montelukast  sodium], sulfonamide derivatives, amitriptyline  hcl, esomeprazole, gabapentin , lexapro [escitalopram], and neosporin [bacitracin-polymyxin b].  MEDICATIONS:  Current Outpatient Medications  Medication Sig  Dispense Refill   atorvastatin (LIPITOR) 20 MG tablet Take 20 mg by mouth daily.     betamethasone dipropionate 0.05 % cream Apply 1 Application topically 2 (two) times daily.     Calcium  Carb-Cholecalciferol  (CALCIUM -VITAMIN D3) 250-125 MG-UNIT TABS Take 1 tablet by mouth daily.     Cholecalciferol (VITAMIN D3) 50 MCG (2000 UT) TABS Take 2,000 Units by mouth daily.     diazepam  (VALIUM ) 5 MG tablet Take 1 tablet (5 mg total) by mouth every 6 (six) hours as needed for muscle spasms. 30 tablet 1   EPINEPHrine  0.3 mg/0.3 mL IJ SOAJ injection Inject 0.3 mg into the muscle as needed for anaphylaxis. 1 each 1   ezetimibe (ZETIA) 10 MG tablet 1 tablet Orally Once a day for 90 days     famotidine  (PEPCID ) 20 MG tablet Take 1 tablet (20 mg total) by mouth daily. (Patient taking differently: Take 20 mg by mouth daily as needed for heartburn or indigestion.)     Fezolinetant (VEOZAH) 45 MG TABS Take 45 mg by mouth daily.     Levothyroxine  Sodium 88 MCG CAPS Take 88 mcg by mouth See admin instructions. Take 88 mcg by mouth for two days and skip a day then repeat cycle.  10   lidocaine -prilocaine  (EMLA ) cream Apply to affected area once 30 g 3   methocarbamol  (ROBAXIN ) 500 MG tablet Take 1 tablet (500 mg total) by mouth 4 (four) times daily as needed for muscle spasms. 60 tablet 0   nystatin -triamcinolone (MYCOLOG II) cream Apply 1 Application topically 2 (two) times daily as needed (irritation).     ondansetron  (ZOFRAN ) 8 MG tablet Take 1 tablet (8 mg total) by mouth every 8 (eight) hours as needed for nausea or vomiting. Start on the third day after chemotherapy. 30 tablet 1   prochlorperazine  (COMPAZINE ) 10 MG tablet Take 1 tablet (10 mg total) by mouth every 6 (six) hours as needed for nausea or vomiting. 30 tablet 1   sertraline  (ZOLOFT ) 50 MG tablet Take 50 mg by mouth daily.     tacrolimus (PROTOPIC) 0.03 % ointment Apply 1 Application topically 2 (two) times daily.     TIROSINT  100 MCG CAPS Take 100 mcg by mouth every 3 (three) days.     No current facility-administered medications for this visit.    PHYSICAL EXAMINATION: Not performed   LABORATORY DATA:  I have reviewed the data as listed    Latest  Ref Rng & Units 12/09/2022   12:02 PM 10/06/2022   11:04 AM 07/16/2022   11:24 AM  CBC  WBC 4.0 - 10.5 K/uL 8.4  5.2  4.7   Hemoglobin 12.0 - 15.0 g/dL 85.6  86.4  85.7   Hematocrit 36.0 - 46.0 % 43.4  41.6  43.4   Platelets 150 - 400 K/uL 270  246  239         Latest Ref Rng & Units 12/09/2022   12:02 PM 10/06/2022   11:04 AM 07/16/2022   11:24 AM  CMP  Glucose 70 - 99 mg/dL 867  848  890   BUN 8 - 23 mg/dL 14  15  14    Creatinine 0.44 - 1.00 mg/dL 9.32  9.27  9.43   Sodium 135 - 145 mmol/L 141  139  139   Potassium 3.5 - 5.1 mmol/L 3.9  3.9  3.9   Chloride 98 - 111 mmol/L 105  105  105   CO2 22 - 32 mmol/L 28  27  28   Calcium  8.9 - 10.3 mg/dL 9.9  9.5  9.6   Total Protein 6.5 - 8.1 g/dL 7.7  7.0  7.5   Total Bilirubin 0.3 - 1.2 mg/dL 0.4  0.5  0.4   Alkaline Phos 38 - 126 U/L 113  106  107   AST 15 - 41 U/L 21  22  20    ALT 0 - 44 U/L 23  23  18        RADIOGRAPHIC STUDIES: I have personally reviewed the radiological images as listed and agreed with the findings in the report. No results found.     I discussed the assessment and treatment plan with the patient. The patient was provided an opportunity to ask questions and all were answered. The patient agreed with the plan and demonstrated an understanding of the instructions.   The patient was advised to call back or seek an in-person evaluation if the symptoms worsen or if the condition fails to improve as anticipated.  I provided 25 minutes of non face-to-face telephone visit time during this encounter, including review of chart and various tests results, discussions about plan of care and coordination of care plan.    Onita Mattock, MD 11/02/23

## 2023-11-02 NOTE — Progress Notes (Signed)
 Surgical Instructions   Your procedure is scheduled on Monday November 09, 2023. Report to [facility] Main Entrance A at 8:00 A.M., then check in with the Admitting office. Any questions or running late day of surgery: call [phone]  Questions prior to your surgery date: call [phone], Monday-Friday, 8am-4pm. If you experience any cold or flu symptoms such as cough, fever, chills, shortness of breath, etc. between now and your scheduled surgery, please notify us  at the above number.     Remember:  Do not eat after midnight the night before your surgery   You may drink clear liquids until 7:00 the morning of your surgery.   Clear liquids allowed are: Water, Non-Citrus Juices (without pulp), Carbonated Beverages, Clear Tea (no milk, honey, etc.), Black Coffee Only (NO MILK, CREAM OR POWDERED CREAMER of any kind), and Gatorade.  Patient Instructions  The night before surgery:  No food after midnight. ONLY clear liquids after midnight  The day of surgery (if you do NOT have diabetes):  Drink ONE (1) Pre-Surgery Clear Ensure by 7:00 the morning of surgery. Drink in one sitting. Do not sip.  This drink was given to you during your hospital  pre-op appointment visit.  Nothing else to drink after completing the  Pre-Surgery Clear Ensure.         If you have questions, please contact your surgeon's office.    Take these medicines the morning of surgery with A SIP OF WATER  Fezolinetant (VEOZAH)  sertraline  (ZOLOFT )  TIROSINT    May take these medicines IF NEEDED: famotidine  (PEPCID )  methocarbamol  (ROBAXIN )  prochlorperazine  (COMPAZINE )  valACYclovir (VALTREX)   One week prior to surgery, STOP taking any Aspirin  (unless otherwise instructed by your surgeon) Aleve, Naproxen, Ibuprofen , Motrin , Advil , Goody's, BC's, all herbal medications, fish oil, and non-prescription vitamins.                     Do NOT Smoke (Tobacco/Vaping) for 24 hours prior to your procedure.  If  you use a CPAP at night, you may bring your mask/headgear for your overnight stay.   You will be asked to remove any contacts, glasses, piercing's, hearing aid's, dentures/partials prior to surgery. Please bring cases for these items if needed.    Patients discharged the day of surgery will not be allowed to drive home, and someone needs to stay with them for 24 hours.  SURGICAL WAITING ROOM VISITATION Patients may have no more than 2 support people in the waiting area - these visitors may rotate.   Pre-op nurse will coordinate an appropriate time for 1 ADULT support person, who may not rotate, to accompany patient in pre-op.  Children under the age of 82 must have an adult with them who is not the patient and must remain in the main waiting area with an adult.  If the patient needs to stay at the hospital during part of their recovery, the visitor guidelines for inpatient rooms apply.  Please refer to the [facility] website for the visitor guidelines for any additional information.   If you received a COVID test during your pre-op visit  it is requested that you wear a mask when out in public, stay away from anyone that may not be feeling well and notify your surgeon if you develop symptoms. If you have been in contact with anyone that has tested positive in the last 10 days please notify you surgeon.      Pre-operative CHG Bathing Instructions   You  can play a key role in reducing the risk of infection after surgery. Your skin needs to be as free of germs as possible. You can reduce the number of germs on your skin by washing with CHG (chlorhexidine  gluconate) soap before surgery. CHG is an antiseptic soap that kills germs and continues to kill germs even after washing.   DO NOT use if you have an allergy to chlorhexidine /CHG or antibacterial soaps. If your skin becomes reddened or irritated, stop using the CHG and notify one of our RNs at [phone].              TAKE A SHOWER THE  NIGHT BEFORE SURGERY AND THE DAY OF SURGERY    Please keep in mind the following:  DO NOT shave, including legs and underarms, 48 hours prior to surgery.   Place clean sheets on your bed the night before surgery Use a clean washcloth (not used since being washed) for each shower. DO NOT sleep with pet's night before surgery.  CHG Shower Instructions:  Wash your face and private area with normal soap. If you choose to wash your hair, wash first with your normal shampoo.  After you use shampoo/soap, rinse your hair and body thoroughly to remove shampoo/soap residue.  Turn the water OFF and apply half the bottle of CHG soap to a CLEAN washcloth.  Apply CHG soap ONLY FROM YOUR NECK DOWN TO YOUR TOES (washing for 3-5 minutes)  DO NOT use CHG soap on face, private areas, open wounds, or sores.  Pay special attention to the area where your surgery is being performed.  If you are having back surgery, having someone wash your back for you may be helpful. Wait 2 minutes after CHG soap is applied, then you may rinse off the CHG soap.  Pat dry with a clean towel  Put on clean pajamas    Additional instructions for the day of surgery: DO NOT APPLY any lotions, deodorants or perfumes.   Do not wear jewelry or makeup Do not wear nail polish, gel polish, artificial nails, or any other type of covering on natural nails (fingers and toes) Do not bring valuables to the hospital. [facility] is not responsible for valuables/personal belongings. Put on clean/comfortable clothes.  Please brush your teeth.  Ask your nurse before applying any prescription medications to the skin.

## 2023-11-03 ENCOUNTER — Encounter (HOSPITAL_BASED_OUTPATIENT_CLINIC_OR_DEPARTMENT_OTHER)
Admission: RE | Admit: 2023-11-03 | Discharge: 2023-11-03 | Disposition: A | Discharge status: Home / Self Care | Source: Ambulatory Visit | Attending: General Surgery | Admitting: General Surgery

## 2023-11-03 ENCOUNTER — Other Ambulatory Visit: Payer: Self-pay

## 2023-11-03 ENCOUNTER — Encounter: Payer: Self-pay | Admitting: *Deleted

## 2023-11-03 ENCOUNTER — Telehealth: Payer: Self-pay | Admitting: Hematology

## 2023-11-03 ENCOUNTER — Encounter (HOSPITAL_COMMUNITY)
Admission: RE | Admit: 2023-11-03 | Discharge: 2023-11-03 | Disposition: A | Discharge status: Home / Self Care | Source: Ambulatory Visit | Attending: General Surgery | Admitting: General Surgery

## 2023-11-03 VITALS — BP 133/74 | HR 82 | Temp 98.3°F | Resp 17 | Ht 67.5 in | Wt 168.1 lb

## 2023-11-03 DIAGNOSIS — E039 Hypothyroidism, unspecified: Secondary | ICD-10-CM | POA: Diagnosis not present

## 2023-11-03 DIAGNOSIS — Z853 Personal history of malignant neoplasm of breast: Secondary | ICD-10-CM | POA: Diagnosis not present

## 2023-11-03 DIAGNOSIS — C772 Secondary and unspecified malignant neoplasm of intra-abdominal lymph nodes: Secondary | ICD-10-CM | POA: Diagnosis not present

## 2023-11-03 DIAGNOSIS — Z01818 Encounter for other preprocedural examination: Secondary | ICD-10-CM

## 2023-11-03 DIAGNOSIS — Z9221 Personal history of antineoplastic chemotherapy: Secondary | ICD-10-CM | POA: Diagnosis not present

## 2023-11-03 DIAGNOSIS — F32A Depression, unspecified: Secondary | ICD-10-CM | POA: Diagnosis not present

## 2023-11-03 DIAGNOSIS — Z923 Personal history of irradiation: Secondary | ICD-10-CM | POA: Diagnosis not present

## 2023-11-03 DIAGNOSIS — F419 Anxiety disorder, unspecified: Secondary | ICD-10-CM | POA: Diagnosis not present

## 2023-11-03 DIAGNOSIS — K219 Gastro-esophageal reflux disease without esophagitis: Secondary | ICD-10-CM | POA: Diagnosis not present

## 2023-11-03 LAB — BASIC METABOLIC PANEL WITH GFR
Anion gap: 13 (ref 5–15)
BUN: 8 mg/dL (ref 8–23)
CO2: 25 mmol/L (ref 22–32)
Calcium: 9.6 mg/dL (ref 8.9–10.3)
Chloride: 102 mmol/L (ref 98–111)
Creatinine, Ser: 0.65 mg/dL (ref 0.44–1.00)
GFR, Estimated: >60 mL/min (ref >60)
Glucose, Bld: 130 mg/dL — ABNORMAL HIGH (ref 70–99)
Potassium: 3.8 mmol/L (ref 3.5–5.1)
Sodium: 140 mmol/L (ref 135–145)

## 2023-11-03 LAB — CBC
HCT: 43.9 % (ref 36.0–46.0)
Hemoglobin: 14.4 g/dL (ref 12.0–15.0)
MCH: 30.8 pg (ref 26.0–34.0)
MCHC: 32.8 g/dL (ref 30.0–36.0)
MCV: 94 fL (ref 80.0–100.0)
Platelets: 240 K/uL (ref 150–400)
RBC: 4.67 MIL/uL (ref 3.87–5.11)
RDW: 12.7 % (ref 11.5–15.5)
WBC: 8.6 K/uL (ref 4.0–10.5)
nRBC: 0 % (ref 0.0–0.2)

## 2023-11-03 MED ORDER — CHLORHEXIDINE GLUCONATE CLOTH 2 % EX PADS: 6.0000 | MEDICATED_PAD | Freq: Once | CUTANEOUS | Status: DC

## 2023-11-03 NOTE — Telephone Encounter (Signed)
 Scheduled appointments per WQ. Talked with the patient and she is aware of the made appointments.

## 2023-11-03 NOTE — Progress Notes (Signed)
 Patient arrives for PAT appointment and states Surgery Center just attempted to call her for her pre-surgery information.  Saddie Sprang, Charge RN called the surgery center and patient is to go there for pre-op.

## 2023-11-03 NOTE — Progress Notes (Signed)

## 2023-11-04 ENCOUNTER — Ambulatory Visit (HOSPITAL_BASED_OUTPATIENT_CLINIC_OR_DEPARTMENT_OTHER): Admitting: Anesthesiology

## 2023-11-04 ENCOUNTER — Ambulatory Visit (HOSPITAL_BASED_OUTPATIENT_CLINIC_OR_DEPARTMENT_OTHER)
Admission: RE | Admit: 2023-11-04 | Discharge: 2023-11-04 | Disposition: A | Discharge status: Home / Self Care | Attending: General Surgery | Admitting: General Surgery

## 2023-11-04 ENCOUNTER — Inpatient Hospital Stay: Payer: 59

## 2023-11-04 ENCOUNTER — Ambulatory Visit (HOSPITAL_COMMUNITY)

## 2023-11-04 ENCOUNTER — Encounter (HOSPITAL_BASED_OUTPATIENT_CLINIC_OR_DEPARTMENT_OTHER): Payer: Self-pay | Admitting: General Surgery

## 2023-11-04 ENCOUNTER — Other Ambulatory Visit: Payer: Self-pay

## 2023-11-04 ENCOUNTER — Inpatient Hospital Stay: Payer: 59 | Admitting: Hematology

## 2023-11-04 ENCOUNTER — Encounter (HOSPITAL_BASED_OUTPATIENT_CLINIC_OR_DEPARTMENT_OTHER)
Admission: RE | Disposition: A | Discharge status: Home / Self Care | Payer: Self-pay | Source: Home / Self Care | Attending: General Surgery

## 2023-11-04 DIAGNOSIS — Z9221 Personal history of antineoplastic chemotherapy: Secondary | ICD-10-CM | POA: Diagnosis not present

## 2023-11-04 DIAGNOSIS — K219 Gastro-esophageal reflux disease without esophagitis: Secondary | ICD-10-CM | POA: Insufficient documentation

## 2023-11-04 DIAGNOSIS — F418 Other specified anxiety disorders: Secondary | ICD-10-CM | POA: Diagnosis not present

## 2023-11-04 DIAGNOSIS — Z853 Personal history of malignant neoplasm of breast: Secondary | ICD-10-CM | POA: Diagnosis not present

## 2023-11-04 DIAGNOSIS — C772 Secondary and unspecified malignant neoplasm of intra-abdominal lymph nodes: Secondary | ICD-10-CM | POA: Diagnosis not present

## 2023-11-04 DIAGNOSIS — Z452 Encounter for adjustment and management of vascular access device: Secondary | ICD-10-CM | POA: Diagnosis not present

## 2023-11-04 DIAGNOSIS — F419 Anxiety disorder, unspecified: Secondary | ICD-10-CM | POA: Insufficient documentation

## 2023-11-04 DIAGNOSIS — Z923 Personal history of irradiation: Secondary | ICD-10-CM | POA: Insufficient documentation

## 2023-11-04 DIAGNOSIS — Z01818 Encounter for other preprocedural examination: Secondary | ICD-10-CM

## 2023-11-04 DIAGNOSIS — C50112 Malignant neoplasm of central portion of left female breast: Secondary | ICD-10-CM | POA: Diagnosis not present

## 2023-11-04 DIAGNOSIS — E039 Hypothyroidism, unspecified: Secondary | ICD-10-CM | POA: Insufficient documentation

## 2023-11-04 DIAGNOSIS — C50911 Malignant neoplasm of unspecified site of right female breast: Secondary | ICD-10-CM | POA: Diagnosis not present

## 2023-11-04 DIAGNOSIS — C50411 Malignant neoplasm of upper-outer quadrant of right female breast: Secondary | ICD-10-CM | POA: Diagnosis not present

## 2023-11-04 DIAGNOSIS — F32A Depression, unspecified: Secondary | ICD-10-CM | POA: Diagnosis not present

## 2023-11-04 HISTORY — PX: PORTACATH PLACEMENT: SHX2246

## 2023-11-04 SURGERY — INSERTION, TUNNELED CENTRAL VENOUS DEVICE, WITH PORT
Anesthesia: General | Site: Chest | Laterality: Left

## 2023-11-04 MED ORDER — ACETAMINOPHEN 500 MG PO TABS: 1000.0000 mg | ORAL_TABLET | ORAL | Status: DC

## 2023-11-04 MED ORDER — OXYCODONE HCL 5 MG PO TABS
ORAL_TABLET | ORAL | Status: AC
Start: 2023-11-04 — End: 2023-11-04
  Filled 2023-11-04: qty 1

## 2023-11-04 MED ORDER — LACTATED RINGERS IV SOLN: INTRAVENOUS | Status: DC

## 2023-11-04 MED ORDER — HEPARIN (PORCINE) IN NACL 2-0.9 UNITS/ML
INTRAMUSCULAR | Status: AC | PRN
  Administered 2023-11-04: 100 mL

## 2023-11-04 MED ORDER — FENTANYL CITRATE (PF) 100 MCG/2ML IJ SOLN
INTRAMUSCULAR | Status: AC
  Filled 2023-11-04: qty 2

## 2023-11-04 MED ORDER — DEXAMETHASONE SODIUM PHOSPHATE 10 MG/ML IJ SOLN
INTRAMUSCULAR | Status: AC
  Filled 2023-11-04: qty 1

## 2023-11-04 MED ORDER — LIDOCAINE 2% (20 MG/ML) 5 ML SYRINGE
INTRAMUSCULAR | Status: AC
  Filled 2023-11-04: qty 5

## 2023-11-04 MED ORDER — MIDAZOLAM HCL 2 MG/2ML IJ SOLN
INTRAMUSCULAR | Status: AC
  Filled 2023-11-04: qty 2

## 2023-11-04 MED ORDER — PROPOFOL 10 MG/ML IV BOLUS
INTRAVENOUS | Status: AC
  Filled 2023-11-04: qty 20

## 2023-11-04 MED ORDER — CEFAZOLIN SODIUM-DEXTROSE 2-4 GM/100ML-% IV SOLN
2.0000 g | INTRAVENOUS | Status: AC
  Administered 2023-11-04: 2 g via INTRAVENOUS

## 2023-11-04 MED ORDER — LIDOCAINE 2% (20 MG/ML) 5 ML SYRINGE
INTRAMUSCULAR | Status: DC | PRN
Start: 2023-11-04 — End: 2023-11-04
  Administered 2023-11-04: 60 mg via INTRAVENOUS

## 2023-11-04 MED ORDER — ONDANSETRON HCL 4 MG/2ML IJ SOLN
INTRAMUSCULAR | Status: AC
  Filled 2023-11-04: qty 2

## 2023-11-04 MED ORDER — ONDANSETRON HCL 4 MG/2ML IJ SOLN
INTRAMUSCULAR | Status: DC | PRN
  Administered 2023-11-04: 4 mg via INTRAVENOUS

## 2023-11-04 MED ORDER — BUPIVACAINE-EPINEPHRINE 0.5% -1:200000 IJ SOLN
INTRAMUSCULAR | Status: DC | PRN
  Administered 2023-11-04: 10 mL

## 2023-11-04 MED ORDER — OXYCODONE HCL 5 MG PO TABS
5.0000 mg | ORAL_TABLET | Freq: Once | ORAL | Status: AC | PRN
  Administered 2023-11-04: 5 mg via ORAL

## 2023-11-04 MED ORDER — HYDROMORPHONE HCL 1 MG/ML IJ SOLN: 0.2500 mg | INTRAMUSCULAR | Status: DC | PRN

## 2023-11-04 MED ORDER — HEPARIN SOD (PORK) LOCK FLUSH 100 UNIT/ML IV SOLN
INTRAVENOUS | Status: DC | PRN
  Administered 2023-11-04: 500 [IU]

## 2023-11-04 MED ORDER — DEXAMETHASONE SODIUM PHOSPHATE 4 MG/ML IJ SOLN
INTRAMUSCULAR | Status: DC | PRN
  Administered 2023-11-04: 5 mg via INTRAVENOUS

## 2023-11-04 MED ORDER — MIDAZOLAM HCL 5 MG/5ML IJ SOLN
INTRAMUSCULAR | Status: DC | PRN
  Administered 2023-11-04: 2 mg via INTRAVENOUS

## 2023-11-04 MED ORDER — OXYCODONE HCL 5 MG/5ML PO SOLN: 5.0000 mg | Freq: Once | ORAL | Status: AC | PRN

## 2023-11-04 MED ORDER — MEPERIDINE HCL 25 MG/ML IJ SOLN: 6.2500 mg | INTRAMUSCULAR | Status: DC | PRN

## 2023-11-04 MED ORDER — FENTANYL CITRATE (PF) 100 MCG/2ML IJ SOLN
INTRAMUSCULAR | Status: DC | PRN
  Administered 2023-11-04 (×2): 50 ug via INTRAVENOUS

## 2023-11-04 MED ORDER — CEFAZOLIN SODIUM-DEXTROSE 2-4 GM/100ML-% IV SOLN
INTRAVENOUS | Status: AC
  Filled 2023-11-04: qty 100

## 2023-11-04 MED ORDER — ACETAMINOPHEN 500 MG PO TABS
ORAL_TABLET | ORAL | Status: AC
  Filled 2023-11-04: qty 2

## 2023-11-04 MED ORDER — PROPOFOL 10 MG/ML IV BOLUS
INTRAVENOUS | Status: DC | PRN
Start: 2023-11-04 — End: 2023-11-04
  Administered 2023-11-04: 200 mg via INTRAVENOUS

## 2023-11-04 MED ORDER — AMISULPRIDE (ANTIEMETIC) 5 MG/2ML IV SOLN: 10.0000 mg | Freq: Once | INTRAVENOUS | Status: DC | PRN

## 2023-11-04 MED ORDER — SODIUM CHLORIDE 0.9 % IV SOLN: 12.5000 mg | INTRAVENOUS | Status: DC | PRN

## 2023-11-04 SURGICAL SUPPLY — 36 items
BAG DECANTER FOR FLEXI CONT (MISCELLANEOUS) ×2 IMPLANT
BLADE HEX COATED 2.75 (ELECTRODE) ×2 IMPLANT
BLADE SURG 11 STRL SS (BLADE) ×2 IMPLANT
BLADE SURG 15 STRL LF DISP TIS (BLADE) ×2 IMPLANT
CHLORAPREP W/TINT 26 (MISCELLANEOUS) ×2 IMPLANT
COVER BACK TABLE 60X90IN (DRAPES) ×2 IMPLANT
COVER MAYO STAND STRL (DRAPES) ×2 IMPLANT
DERMABOND ADVANCED .7 DNX12 (GAUZE/BANDAGES/DRESSINGS) ×2 IMPLANT
DRAPE C-ARM 42X72 X-RAY (DRAPES) ×2 IMPLANT
DRAPE LAPAROTOMY TRNSV 102X78 (DRAPES) ×2 IMPLANT
DRAPE UTILITY XL STRL (DRAPES) ×2 IMPLANT
DRSG TEGADERM 4X4.75 (GAUZE/BANDAGES/DRESSINGS) IMPLANT
ELECTRODE REM PT RTRN 9FT ADLT (ELECTROSURGICAL) ×2 IMPLANT
GAUZE 4X4 16PLY ~~LOC~~+RFID DBL (SPONGE) ×2 IMPLANT
GAUZE SPONGE 4X4 12PLY STRL LF (GAUZE/BANDAGES/DRESSINGS) IMPLANT
GLOVE BIO SURGEON STRL SZ 6 (GLOVE) ×2 IMPLANT
GLOVE BIOGEL PI IND STRL 6.5 (GLOVE) ×2 IMPLANT
GOWN STRL REUS W/ TWL LRG LVL3 (GOWN DISPOSABLE) ×2 IMPLANT
GOWN STRL REUS W/ TWL XL LVL3 (GOWN DISPOSABLE) ×2 IMPLANT
IV CONNECTOR ONE LINK NDLESS (IV SETS) IMPLANT
KIT CVR 48X5XPRB PLUP LF (MISCELLANEOUS) ×2 IMPLANT
KIT PORT POWER 8FR ISP CVUE (Port) IMPLANT
NDL HYPO 25X1 1.5 SAFETY (NEEDLE) ×2 IMPLANT
NEEDLE HYPO 25X1 1.5 SAFETY (NEEDLE) ×1 IMPLANT
PACK BASIN DAY SURGERY FS (CUSTOM PROCEDURE TRAY) ×2 IMPLANT
PENCIL SMOKE EVACUATOR (MISCELLANEOUS) ×2 IMPLANT
SLEEVE SCD COMPRESS KNEE MED (STOCKING) ×2 IMPLANT
SPIKE FLUID TRANSFER (MISCELLANEOUS) IMPLANT
SUT MNCRL AB 4-0 PS2 18 (SUTURE) ×2 IMPLANT
SUT PROLENE 2 0 SH DA (SUTURE) ×4 IMPLANT
SUT VIC AB 3-0 SH 27X BRD (SUTURE) ×2 IMPLANT
SUT VICRYL 3-0 CR8 SH (SUTURE) IMPLANT
SYR 10ML LL (SYRINGE) ×2 IMPLANT
SYR 5ML LUER SLIP (SYRINGE) ×2 IMPLANT
SYR CONTROL 10ML LL (SYRINGE) ×2 IMPLANT
TOWEL GREEN STERILE FF (TOWEL DISPOSABLE) ×2 IMPLANT

## 2023-11-04 NOTE — Discharge Instructions (Addendum)
 [facility] Office Phone Number [phone]   POST OP INSTRUCTIONS  Always review your discharge instruction sheet given to you by the facility where your surgery was performed.  IF YOU HAVE DISABILITY OR FAMILY LEAVE FORMS, YOU MUST BRING THEM TO THE OFFICE FOR PROCESSING.  DO NOT GIVE THEM TO YOUR DOCTOR.  Take 2 tylenol  (acetominophen) three times a day for 3 days.  If you still have pain, add ibuprofen  with food in between if able to take this (if you have kidney issues or stomach issues, do not take ibuprofen ).  If both of those are not enough, add the narcotic pain pill.  If you find you are needing a lot of this overnight after surgery, call the next morning for a refill.   Take your usually prescribed medications unless otherwise directed If you need a refill on your pain medication, please contact your pharmacy.  They will contact our office to request authorization.  Prescriptions will not be filled after 5pm or on week-ends. You should eat very light the first 24 hours after surgery, such as soup, crackers, pudding, etc.  Resume your normal diet the day after surgery It is common to experience some constipation if taking pain medication after surgery.  Increasing fluid intake and taking a stool softener will usually help or prevent this problem from occurring.  A mild laxative (Milk of Magnesia or Miralax) should be taken according to package directions if there are no bowel movements after 48 hours. You may shower in 48 hours.  The surgical glue will flake off in 2-3 weeks.   ACTIVITIES:  No strenuous activity or heavy lifting for 1 week.   You may drive when you no longer are taking prescription pain medication, you can comfortably wear a seatbelt, and you can safely maneuver your car and apply brakes. RETURN TO WORK:  __________to be determined_______________ Rosine should see your doctor in the office for a follow-up appointment approximately three-four weeks after your  surgery.    WHEN TO CALL YOUR DOCTOR: Fever over 101.0 Nausea and/or vomiting. Extreme swelling or bruising. Continued bleeding from incision. Increased pain, redness, or drainage from the incision.  The clinic staff is available to answer your questions during regular business hours.  Please don't hesitate to call and ask to speak to one of the nurses for clinical concerns.  If you have a medical emergency, go to the nearest emergency room or call 911.  A surgeon from [facility] is always on call at the hospital.  For further questions, please visit [email or web address]   May take Tylenol  after 9:30 am.    Post Anesthesia Home Care Instructions  Activity: Get plenty of rest for the remainder of the day. A responsible individual must stay with you for 24 hours following the procedure.  For the next 24 hours, DO NOT: -Drive a car -Advertising copywriter -Drink alcoholic beverages -Take any medication unless instructed by your physician -Make any legal decisions or sign important papers.  Meals: Start with liquid foods such as gelatin or soup. Progress to regular foods as tolerated. Avoid greasy, spicy, heavy foods. If nausea and/or vomiting occur, drink only clear liquids until the nausea and/or vomiting subsides. Call your physician if vomiting continues.  Special Instructions/Symptoms: Your throat may feel dry or sore from the anesthesia or the breathing tube placed in your throat during surgery. If this causes discomfort, gargle with warm salt water. The discomfort should disappear within 24 hours.  If you had a  scopolamine  patch placed behind your ear for the management of post- operative nausea and/or vomiting:  1. The medication in the patch is effective for 72 hours, after which it should be removed.  Wrap patch in a tissue and discard in the trash. Wash hands thoroughly with soap and water. 2. You may remove the patch earlier than 72 hours if you experience  unpleasant side effects which may include dry mouth, dizziness or visual disturbances. 3. Avoid touching the patch. Wash your hands with soap and water after contact with the patch.

## 2023-11-04 NOTE — Anesthesia Procedure Notes (Signed)
 Procedure Name: LMA Insertion Date/Time: 11/04/2023 7:46 AM  Performed by: Pam Macario BROCKS, CRNAPre-anesthesia Checklist: Patient identified, Emergency Drugs available, Suction available, Patient being monitored and Timeout performed Patient Re-evaluated:Patient Re-evaluated prior to induction Oxygen Delivery Method: Circle system utilized Preoxygenation: Pre-oxygenation with 100% oxygen Induction Type: IV induction Ventilation: Mask ventilation without difficulty LMA: LMA inserted LMA Size: 4.0 Number of attempts: 1 Airway Equipment and Method: Bite block Placement Confirmation: positive ETCO2, CO2 detector and breath sounds checked- equal and bilateral Tube secured with: Tape Dental Injury: Teeth and Oropharynx as per pre-operative assessment

## 2023-11-04 NOTE — Anesthesia Preprocedure Evaluation (Addendum)
 Anesthesia Evaluation  Patient identified by MRN, date of birth, ID band Patient awake    Reviewed: Allergy & Precautions, H&P , NPO status , Patient's Chart, lab work & pertinent test results  Airway Mallampati: III  TM Distance: >3 FB Neck ROM: Full    Dental no notable dental hx. (+) Teeth Intact, Dental Advisory Given   Pulmonary neg pulmonary ROS   Pulmonary exam normal breath sounds clear to auscultation       Cardiovascular negative cardio ROS Normal cardiovascular exam Rhythm:Regular Rate:Normal     Neuro/Psych  PSYCHIATRIC DISORDERS Anxiety Depression    negative neurological ROS     GI/Hepatic Neg liver ROS,GERD  Controlled and Medicated,,  Endo/Other  Hypothyroidism    Renal/GU negative Renal ROS  negative genitourinary   Musculoskeletal  (+) Arthritis , Osteoarthritis,    Abdominal   Peds negative pediatric ROS (+)  Hematology negative hematology ROS (+)   Anesthesia Other Findings Breast Cancer  Reproductive/Obstetrics negative OB ROS                              Anesthesia Physical Anesthesia Plan  ASA: 3  Anesthesia Plan: General   Post-op Pain Management: Tylenol  PO (pre-op)* and Toradol IV (intra-op)*   Induction: Intravenous  PONV Risk Score and Plan: 3 and Ondansetron , Dexamethasone , Midazolam  and Treatment may vary due to age or medical condition  Airway Management Planned: LMA  Additional Equipment:   Intra-op Plan:   Post-operative Plan: Extubation in OR  Informed Consent: I have reviewed the patients History and Physical, chart, labs and discussed the procedure including the risks, benefits and alternatives for the proposed anesthesia with the patient or authorized representative who has indicated his/her understanding and acceptance.     Dental advisory given  Plan Discussed with: CRNA  Anesthesia Plan Comments:         Anesthesia Quick  Evaluation

## 2023-11-04 NOTE — Interval H&P Note (Signed)
 History and Physical Interval Note:  11/04/2023 7:29 AM  Samantha [name]  has presented today for surgery, with the diagnosis of RECURRENT RIGHT BREAST CANCER.  The various methods of treatment have been discussed with the patient and family. After consideration of risks, benefits and other options for treatment, the patient has consented to  Procedure(s) with comments: INSERTION, TUNNELED CENTRAL VENOUS DEVICE, WITH PORT (N/A) - PORT PLACEMENT WITH ULTRASOUND GUIDANCE as a surgical intervention.  The patient's history has been reviewed, patient examined, no change in status, stable for surgery.  I have reviewed the patient's chart and labs.  Questions were answered to the patient's satisfaction.     Jina Nephew

## 2023-11-04 NOTE — Op Note (Signed)
 PREOPERATIVE DIAGNOSIS:  recurrent right breast cancer     POSTOPERATIVE DIAGNOSIS:  Same     PROCEDURE: left internal jugular port placement, Bard ClearVue Power Port, MRI safe, 8-French.      SURGEON:  Jina Nephew, MD      ANESTHESIA:  General   FINDINGS:  Good venous return, easy flush, and tip of the catheter and   SVC 28 cm.      SPECIMEN:  None.      ESTIMATED BLOOD LOSS:  Minimal.      COMPLICATIONS:  None known.      PROCEDURE:  Pt was identified in the holding area and taken to   the operating room, where patient was placed supine on the operating room   table.  General anesthesia was induced.  Patient's arms were tucked and the upper   chest and neck were prepped and draped in sterile fashion.  Time-out was   performed according to the surgical safety check list.  When all was   correct, we continued.   Ultrasound was used to evaluate the left internal jugular vein.  The vein was accessed with 1 pass(es) of the needle. There was good venous return and the wire passed easily with no ectopy.  Fluoroscopy was used to confirm that the wire was in the vena cava.      The patient was placed back level and the area for the pocket was anethetized   with local anesthetic.  A 3-cm transverse incision was made with a #15   blade.  Cautery was used to divide the subcutaneous tissues down to the   pectoralis muscle.  An [employer] retractor was used to elevate the skin   while a pocket was created on top of the pectoralis fascia.  The port   was placed into the pocket to confirm that it was of adequate size.  The   catheter was preattached to the port.  The port was then secured to the   pectoralis fascia with four 2-0 Prolene sutures.  These were clamped and   not tied down yet.    The catheter was tunneled through to the wire exit   site.  The catheter was placed along the wire to determine what length it should be to be in the SVC.  The catheter was cut at 28 cm.  The tunneler  sheath and dilator were passed over the wire and the dilator and wire were removed.  The catheter was advanced through the tunneler sheath and the tunneler sheath was pulled away.  Care was taken to keep the catheter in the tunneler sheath as this occurred. This was advanced and the tunneler sheath was removed.  There was good venous   return and easy flush of the catheter.  The Prolene sutures were tied   down to the pectoral fascia.  The skin was reapproximated using 3-0   Vicryl interrupted deep dermal sutures.    Fluoroscopy was used to re-confirm good position of the catheter.  The skin   was then closed using 4-0 Monocryl in a subcuticular fashion.  The port was flushed with concentrated heparin  flush as well.  The wounds were then cleaned, dried, and dressed with Dermabond.  The patient was awakened from anesthesia and taken to the PACU in stable condition.  Needle, sponge, and instrument counts were correct.               Jina Nephew, MD

## 2023-11-04 NOTE — Anesthesia Postprocedure Evaluation (Signed)
 Anesthesia Post Note  Patient: Samantha [name]  Procedure(s) Performed: INSERTION, TUNNELED CENTRAL VENOUS DEVICE, WITH PORT (Left: Chest)     Patient location during evaluation: PACU Anesthesia Type: General Level of consciousness: awake and alert Pain management: pain level controlled Vital Signs Assessment: post-procedure vital signs reviewed and stable Respiratory status: spontaneous breathing, nonlabored ventilation and respiratory function stable Cardiovascular status: blood pressure returned to baseline and stable Postop Assessment: no apparent nausea or vomiting Anesthetic complications: no   No notable events documented.  Last Vitals:  Vitals:   11/04/23 0924 11/04/23 1005  BP:  (!) 142/75  Pulse: 90 86  Resp: 15 16  Temp:  (!) 36.4 C  SpO2: 96% 97%    Last Pain:  Vitals:   11/04/23 1005  TempSrc: Temporal  PainSc:                  Butler Levander Pinal

## 2023-11-04 NOTE — Transfer of Care (Signed)
 Immediate Anesthesia Transfer of Care Note  Patient: Samantha [name]  Procedure(s) Performed: INSERTION, TUNNELED CENTRAL VENOUS DEVICE, WITH PORT (Left: Chest)  Patient Location: PACU  Anesthesia Type:General  Level of Consciousness: awake and alert   Airway & Oxygen Therapy: Patient Spontanous Breathing  Post-op Assessment: Report given to RN  Post vital signs: Reviewed and stable  Last Vitals:  Vitals Value Taken Time  BP 146/68 11/04/23 08:48  Temp 36.2 C 11/04/23 08:47  Pulse 95 11/04/23 08:49  Resp 17 11/04/23 08:49  SpO2 100 % 11/04/23 08:49    Last Pain:  Vitals:   11/04/23 0636  TempSrc: Temporal  PainSc: 0-No pain      Patients Stated Pain Goal: 0 (11/04/23 0636)  Complications: No notable events documented.

## 2023-11-05 ENCOUNTER — Encounter (HOSPITAL_BASED_OUTPATIENT_CLINIC_OR_DEPARTMENT_OTHER): Payer: Self-pay | Admitting: General Surgery

## 2023-11-05 ENCOUNTER — Telehealth: Payer: Self-pay | Admitting: *Deleted

## 2023-11-05 NOTE — Telephone Encounter (Signed)
 Spoke with patient regarding dignicap and instructions.  Gave instructions and patient states she will get registered.  Informed her to let us  know if she changes her mind.  Patient verbalized understanding.

## 2023-11-06 NOTE — Progress Notes (Signed)
 Pharmacist Chemotherapy Monitoring - Initial Assessment    Anticipated start date: 11/13/23   The following has been reviewed per standard work regarding the patient's treatment regimen: The patient's diagnosis, treatment plan and drug doses, and organ/hematologic function Lab orders and baseline tests specific to treatment regimen  The treatment plan start date, drug sequencing, and pre-medications Prior authorization status  Patient's documented medication list, including drug-drug interaction screen and prescriptions for anti-emetics and supportive care specific to the treatment regimen The drug concentrations, fluid compatibility, administration routes, and timing of the medications to be used The patient's access for treatment and lifetime cumulative dose history, if applicable  The patient's medication allergies and previous infusion related reactions, if applicable   Changes made to treatment plan:  N/A  Follow up needed:  N/A   Samantha [name], RPH, 11/06/2023  2:22 PM

## 2023-11-09 ENCOUNTER — Other Ambulatory Visit: Payer: Self-pay | Admitting: Hematology

## 2023-11-09 ENCOUNTER — Other Ambulatory Visit: Payer: Self-pay

## 2023-11-09 DIAGNOSIS — C50411 Malignant neoplasm of upper-outer quadrant of right female breast: Secondary | ICD-10-CM

## 2023-11-10 ENCOUNTER — Inpatient Hospital Stay

## 2023-11-10 ENCOUNTER — Encounter: Payer: Self-pay | Admitting: Nurse Practitioner

## 2023-11-10 ENCOUNTER — Inpatient Hospital Stay: Admitting: Hematology

## 2023-11-10 VITALS — BP 116/64 | HR 94 | Temp 98.1°F | Resp 16 | Ht 67.0 in | Wt 168.3 lb

## 2023-11-10 DIAGNOSIS — Z1722 Progesterone receptor negative status: Secondary | ICD-10-CM | POA: Diagnosis not present

## 2023-11-10 DIAGNOSIS — C50411 Malignant neoplasm of upper-outer quadrant of right female breast: Secondary | ICD-10-CM

## 2023-11-10 DIAGNOSIS — Z79899 Other long term (current) drug therapy: Secondary | ICD-10-CM | POA: Diagnosis not present

## 2023-11-10 DIAGNOSIS — Z888 Allergy status to other drugs, medicaments and biological substances status: Secondary | ICD-10-CM | POA: Diagnosis not present

## 2023-11-10 DIAGNOSIS — Z923 Personal history of irradiation: Secondary | ICD-10-CM | POA: Diagnosis not present

## 2023-11-10 DIAGNOSIS — R911 Solitary pulmonary nodule: Secondary | ICD-10-CM | POA: Diagnosis not present

## 2023-11-10 DIAGNOSIS — R110 Nausea: Secondary | ICD-10-CM | POA: Diagnosis not present

## 2023-11-10 DIAGNOSIS — Z171 Estrogen receptor negative status [ER-]: Secondary | ICD-10-CM | POA: Diagnosis not present

## 2023-11-10 DIAGNOSIS — Z9221 Personal history of antineoplastic chemotherapy: Secondary | ICD-10-CM | POA: Diagnosis not present

## 2023-11-10 DIAGNOSIS — I2510 Atherosclerotic heart disease of native coronary artery without angina pectoris: Secondary | ICD-10-CM | POA: Diagnosis not present

## 2023-11-10 DIAGNOSIS — R221 Localized swelling, mass and lump, neck: Secondary | ICD-10-CM | POA: Diagnosis not present

## 2023-11-10 DIAGNOSIS — C773 Secondary and unspecified malignant neoplasm of axilla and upper limb lymph nodes: Secondary | ICD-10-CM | POA: Diagnosis not present

## 2023-11-10 DIAGNOSIS — N6331 Unspecified lump in axillary tail of the right breast: Secondary | ICD-10-CM | POA: Diagnosis not present

## 2023-11-10 DIAGNOSIS — R410 Disorientation, unspecified: Secondary | ICD-10-CM | POA: Diagnosis not present

## 2023-11-10 DIAGNOSIS — Z882 Allergy status to sulfonamides status: Secondary | ICD-10-CM | POA: Diagnosis not present

## 2023-11-10 DIAGNOSIS — D0512 Intraductal carcinoma in situ of left breast: Secondary | ICD-10-CM | POA: Diagnosis not present

## 2023-11-10 DIAGNOSIS — Z9089 Acquired absence of other organs: Secondary | ICD-10-CM | POA: Diagnosis not present

## 2023-11-10 DIAGNOSIS — Z1732 Human epidermal growth factor receptor 2 negative status: Secondary | ICD-10-CM | POA: Diagnosis not present

## 2023-11-10 DIAGNOSIS — H9201 Otalgia, right ear: Secondary | ICD-10-CM | POA: Diagnosis not present

## 2023-11-10 DIAGNOSIS — Z79621 Long term (current) use of calcineurin inhibitor: Secondary | ICD-10-CM | POA: Diagnosis not present

## 2023-11-10 LAB — CMP (CANCER CENTER ONLY)
ALT: 33 U/L (ref 0–44)
AST: 22 U/L (ref 15–41)
Albumin: 4.5 g/dL (ref 3.5–5.0)
Alkaline Phosphatase: 99 U/L (ref 38–126)
Anion gap: 6 (ref 5–15)
BUN: 15 mg/dL (ref 8–23)
CO2: 30 mmol/L (ref 22–32)
Calcium: 9.6 mg/dL (ref 8.9–10.3)
Chloride: 104 mmol/L (ref 98–111)
Creatinine: 0.71 mg/dL (ref 0.44–1.00)
GFR, Estimated: >60 mL/min (ref >60)
Glucose, Bld: 145 mg/dL — ABNORMAL HIGH (ref 70–99)
Potassium: 4.5 mmol/L (ref 3.5–5.1)
Sodium: 140 mmol/L (ref 135–145)
Total Bilirubin: 0.6 mg/dL (ref 0.0–1.2)
Total Protein: 7.8 g/dL (ref 6.5–8.1)

## 2023-11-10 LAB — CBC WITH DIFFERENTIAL (CANCER CENTER ONLY)
Abs Immature Granulocytes: 0.01 K/uL (ref 0.00–0.07)
Basophils Absolute: 0 K/uL (ref 0.0–0.1)
Basophils Relative: 0 %
Eosinophils Absolute: 0 K/uL (ref 0.0–0.5)
Eosinophils Relative: 0 %
HCT: 43.5 % (ref 36.0–46.0)
Hemoglobin: 14.4 g/dL (ref 12.0–15.0)
Immature Granulocytes: 0 %
Lymphocytes Relative: 9 %
Lymphs Abs: 0.9 K/uL (ref 0.7–4.0)
MCH: 31.2 pg (ref 26.0–34.0)
MCHC: 33.1 g/dL (ref 30.0–36.0)
MCV: 94.2 fL (ref 80.0–100.0)
Monocytes Absolute: 0.6 K/uL (ref 0.1–1.0)
Monocytes Relative: 7 %
Neutro Abs: 7.6 K/uL (ref 1.7–7.7)
Neutrophils Relative %: 83 %
Platelet Count: 209 K/uL (ref 150–400)
RBC: 4.62 MIL/uL (ref 3.87–5.11)
RDW: 13 % (ref 11.5–15.5)
WBC Count: 9.2 K/uL (ref 4.0–10.5)

## 2023-11-10 LAB — T4, FREE: Free T4: 1.67 ng/dL — ABNORMAL HIGH (ref 0.61–1.12)

## 2023-11-10 LAB — TSH: TSH: 0.099 u[IU]/mL — ABNORMAL LOW (ref 0.350–4.500)

## 2023-11-10 NOTE — Progress Notes (Signed)
 Pt requested to go to lab for a peripheral draw today since she was not prepared for [facility] access. Lab appt booked, pt arrived.

## 2023-11-10 NOTE — Progress Notes (Signed)
 [facility]   Telephone:([phone]) [phone] Fax:(336) [phone]   Clinic Follow up Note   Patient Care Team: [name], MD as PCP - General (Internal Medicine) Glean Stephane BROCKS, RN (Inactive) as Oncology Nurse Navigator [name], RN as Oncology Nurse Navigator [name], MD as Consulting Physician (General Surgery) [name], MD as Consulting Physician (Hematology) [name], MD as Consulting Physician (Radiation Oncology) [name], MD as Consulting Physician (Endocrinology) [name], MD as Consulting Physician (Sports Medicine) [name], MD as Consulting Physician (Obstetrics and Gynecology) [name], [name], NP as Nurse Practitioner (Nurse Practitioner) Imaging, [facility] as Radiologist (Diagnostic Radiology)  Date of Service:  11/10/2023  CHIEF COMPLAINT: f/u of recurrent breast cancer  CURRENT THERAPY:  Pending chemotherapy carboplatin paclitaxel and Keytruda  Oncology History   Ductal carcinoma in situ (DCIS) of left breast -diagnosed in 12/05/2022, ER-/PR-, g2 -s/p left lumpectomy by Dr. Aron on January 01, 2023.  Surgical path showed intermediate grade DCIS with necrosis, margins were negative. -She has completed adjuvant radiation -No role for adjuvant antiestrogen therapy -Continue breast cancer surveillance  Malignant neoplasm of upper-outer quadrant of right breast in female, estrogen receptor negative (HCC) IDC and DCIS, Stage IA, pT1b, cN0, triple negative, Grade 3 -found on screening mammogram. S/p lumpectomy on 01/08/22 with Dr. Aron, path showed: 8 mm invasive and in situ ductal carcinoma with negative margins. Repeat prognostic panel confirmed triple negative disease.  -lymph node biopsies 01/28/22 showed negative nodes (0/2). Port placed at time of procedure. Postoperative course complicated by pneumothorax, which has resolved. -she began adjuvant TC on 02/20/22, and completed planned 4 cycles  on 04/24/2022. -she completed adjuvant RT on 07/09/2022  -biopsy confirmed chest lymph nodes (axilla and chest wall including posteriad pectoralis muscle)  recurrence in 10/2023 - Case reviewed in breast tumor board, plan to start neoadjuvant chemotherapy with carboplatin and paclitaxel every 3 weeks for 4 cycles, followed by AC every 3 weeks for 4 cycles, along with immunotherapy Keytruda for 1 year.  If she has good response to treatment, Dr. Aron will consider surgery although she is not able to remove the aubpectoral lymph node.  She may need adjuvant radiation after surgery.  Assessment & Plan Recurrent breast cancer Recurrent breast cancer requiring initiation of chemotherapy with a regimen of carboplatin, Taxol, and an immunotherapy drug. The chemotherapy is more intensive than previous treatments, involving two chemotherapy drugs and an immunotherapy agent. Side effects discussed include nausea, diarrhea, appetite issues, hair loss, risk of infection, and potential immunotherapy-related side effects such as thyroid  issues, fatigue, and lung inflammation. - Start chemotherapy regimen with carboplatin, Taxol, and immunotherapy drug. - Administer premedications including Aloxi , Pepcid , and Benadryl  on the first day of each cycle. - Use Compazine  for nausea management during the first week and two days after each cycle. - Schedule growth factor injections on August 15th, 18th, and 19th to boost white blood cell count. - Schedule second cycle of chemotherapy today. - Monitor for side effects such as fever, chills, and signs of infection. - Repeat CT scan in three months to assess treatment response.  Hypothyroidism Hypothyroidism may worsen with immunotherapy treatment. Routine monitoring of thyroid  function is necessary, and adjustments to thyroid  medication may be required. - Monitor thyroid  function routinely. - Coordinate with primary care physician for thyroid  medication adjustments if  needed.  Plan - I again reviewed that the chemo regiment schedule and supportive care in detail, and also ask navigator [place] for a  brief chemo education today  - Scheduled to start for cycle carbo, Taxol and Keytruda this Friday - Follow-up with me next week before cycle 1 day 8 treatment   SUMMARY OF ONCOLOGIC HISTORY: Oncology History Overview Note   Cancer Staging  Malignant neoplasm of upper-outer quadrant of right breast in female, estrogen receptor negative Staging form: Breast, AJCC 8th Edition - Clinical stage from 12/23/2021: cT1b, cM0, G3, ER-, PR-, HER2- - Unsigned Stage prefix: Initial diagnosis Histologic grading system: 3 grade system - Pathologic stage from 01/08/2022: Stage IB (pT1b, pN0, cM0, G3, ER-, PR-, HER2-) - Signed by [name], MD on 01/24/2022 Stage prefix: Initial diagnosis Histologic grading system: 3 grade system Residual tumor (R): R0 - None     Malignant neoplasm of upper-outer quadrant of right breast in female, estrogen receptor negative (HCC)  12/06/2021 Mammogram   CLINICAL DATA:  Patient returns today to evaluate RIGHT breast calcifications identified on a recent screening mammogram.   EXAM: DIGITAL DIAGNOSTIC UNILATERAL RIGHT MAMMOGRAM  IMPRESSION: Grouped coarse heterogeneous calcifications within the upper-outer quadrant of the RIGHT breast, measuring 6 mm extent. These may be fibroadenomatous calcifications. Stereotactic biopsy is recommended to exclude malignancy.   12/18/2021 Initial Biopsy   Diagnosis Breast, right, needle core biopsy, upper outer quadrant, x clip - DUCTAL CARCINOMA IN SITU, SOLID AND CRIBRIFORM TYPE WITH COMEDONECROSIS AND ASSOCIATED CALCIFICATIONS, NUCLEAR GRADE 3 OF 3 - FOCAL MICROINVASION IS PRESENT (LESS THAN 1 MM) WITH EVIDENCE OF LYMPHOVASCULAR INVASION - NECROSIS: PRESENT - CALCIFICATIONS: PRESENT - DCIS LENGTH: 7 MM IN GREATEST LINEAR DIMENSION ON FRAGMENTED CORES  PROGNOSTIC  INDICATORS Results: IMMUNOHISTOCHEMICAL AND MORPHOMETRIC ANALYSIS PERFORMED MANUALLY The tumor cells are NEGATIVE for Her2 (1+). Estrogen Receptor: 0%, NEGATIVE Progesterone Receptor: 0%, NEGATIVE Proliferation Marker Ki67: 60%   12/23/2021 Initial Diagnosis   Malignant neoplasm of upper-outer quadrant of right breast in female, estrogen receptor negative (HCC)   12/23/2021 Imaging   EXAM: ULTRASOUND OF THE RIGHT AXILLA  IMPRESSION: No abnormal appearing RIGHT axillary lymph nodes.   01/08/2022 Cancer Staging   Staging form: Breast, AJCC 8th Edition - Pathologic stage from 01/08/2022: Stage IB (pT1b, pN0, cM0, G3, ER-, PR-, HER2-) - Signed by [name], MD on 01/24/2022 Stage prefix: Initial diagnosis Histologic grading system: 3 grade system Residual tumor (R): R0 - None   02/20/2022 - 04/24/2022 Chemotherapy   Patient is on Treatment Plan : BREAST TC q21d     10/06/2022 Survivorship   SCP delivered by Lacie [name], NP   11/11/2023 -  Chemotherapy   Patient is on Treatment Plan : BREAST Pembrolizumab (200) D1 + Carboplatin (5) D1 + Paclitaxel (80) D1,8,15 q21d X 4 cycles / Pembrolizumab (200) D1 + AC D1 q21d x 4 cycles     Ductal carcinoma in situ (DCIS) of left breast  12/09/2022 Initial Diagnosis   Ductal carcinoma in situ (DCIS) of left breast   01/01/2023 Surgery   Let breast seed localized lumpectomy.with Dr. Aron  2.5 cm DCIS with negative margins.     02/11/2023 - 03/10/2023 Radiation Therapy   Dose per fraction - 2.66 Gy Prescribed dose (delivered/prescribed)  42.56/42.56 Prescribed Fxs (delivered/prescribed)  16/16  Boost  Dose per fraction -  2Gy Prescribed dose (delivered/prescribed) 8Gy/8Gy) Prescribed Fxs (delivered/prescribed) (4Gy/4Gy)        Discussed the use of AI scribe software for clinical note transcription with the patient, who gave verbal consent to proceed.  History of Present Illness Samantha [name] is a 63 year old female with recurrent  breast cancer who presents to start chemotherapy.  She is scheduled to begin a chemotherapy regimen with carboplatin, paclitaxel, and an immunotherapy drug, including premedication to prevent nausea and allergic reactions. Infusions are planned for August 1st, 7th, and 14th, with growth factor injections on the 15th, 18th, and 19th. Compazine  will be used for nausea during the first week of each cycle, followed by odinatrol.  A port was placed last Wednesday, which is bruised and causes pain when bending forward, though it is not tender to touch.  She has hypothyroidism.  She is using DigniCap for hair preservation during chemotherapy.     All other systems were reviewed with the patient and are negative.  MEDICAL HISTORY:  Past Medical History:  Diagnosis Date   Allergy    Anxiety 2015   Result of culmination of super stressful caregiving and deaths of 2 immediate family members.  Overall do pretty well.   Cancer [facility]) 2023   right breast   Coronary artery calcification    Depression    denies   GERD (gastroesophageal reflux disease)    Hyperlipidemia    Hypothyroidism    Right carpal tunnel syndrome 09/19/2019   Thyroid  disease     SURGICAL HISTORY: Past Surgical History:  Procedure Laterality Date   BREAST BIOPSY Left 12/04/2022   MM LT BREAST BX W LOC DEV 1ST LESION IMAGE BX SPEC STEREO GUIDE 12/04/2022 [facility]   BREAST BIOPSY  12/30/2022   MM LT RADIOACTIVE SEED LOC MAMMO GUIDE 12/30/2022 [facility]   BREAST LUMPECTOMY WITH RADIOACTIVE SEED LOCALIZATION Right 01/08/2022   Procedure: RIGHT BREAST LUMPECTOMY WITH RADIOACTIVE SEED LOCALIZATION;  Surgeon: [name], MD;  Location: [facility] OR;  Service: General;  Laterality: Right;   BREAST LUMPECTOMY WITH RADIOACTIVE SEED LOCALIZATION Left 01/01/2023   Procedure: LEFT BREAST SEED LOCALIZED LUMPECTOMY;  Surgeon: [name], MD;  Location: [facility] OR;  Service: General;  Laterality: Left;   PORT-A-CATH REMOVAL Left  05/20/2022   Procedure: REMOVAL PORT-A-CATH;  Surgeon: [name], MD;  Location: [facility];  Service: General;  Laterality: Left;   PORTACATH PLACEMENT Left 01/28/2022   Procedure: INSERTION PORT-A-CATH;  Surgeon: [name], MD;  Location: [facility];  Service: General;  Laterality: Left;   PORTACATH PLACEMENT Left 11/04/2023   Procedure: INSERTION, TUNNELED CENTRAL VENOUS DEVICE, WITH PORT;  Surgeon: [name], MD;  Location: [facility];  Service: General;  Laterality: Left;  PORT PLACEMENT WITH ULTRASOUND GUIDANCE   SENTINEL NODE BIOPSY Right 01/28/2022   Procedure: RIGHT SENTINEL LYMPH NODE BIOPSY;  Surgeon: [name], MD;  Location:  SURGERY CENTER;  Service: General;  Laterality: Right;   TONSILLECTOMY      I have reviewed the social history and family history with the patient and they are unchanged from previous note.  ALLERGIES:  is allergic to nortriptyline , singulair  [montelukast  sodium], sulfonamide derivatives, amitriptyline  hcl, esomeprazole, fluticasone , gabapentin , lexapro [escitalopram], neomycin, pseudoephedrine, and neosporin [bacitracin-polymyxin b].  MEDICATIONS:  Current Outpatient Medications  Medication Sig Dispense Refill   acetaminophen  (TYLENOL ) 325 MG tablet Take 650 mg by mouth every 6 (six) hours as needed.     ALPRAZolam (XANAX) 0.5 MG tablet Take 0.5 mg by mouth at bedtime as needed for anxiety.     atorvastatin (LIPITOR) 20 MG tablet Take 20 mg by mouth every evening.     betamethasone dipropionate 0.05 % cream Apply 1 Application topically 2 (two) times daily as needed (skin irritation.).     BIOTIN  PO Take 1 tablet by mouth 2 (two) times a week. With lunch     Calcium  Carb-Cholecalciferol (CALCIUM -VITAMIN D3) 250-125 MG-UNIT TABS Take 1 tablet by mouth daily with lunch.     Cholecalciferol (VITAMIN D3) 50 MCG (2000 UT) TABS Take 2,000 Units by mouth daily with lunch.     diazepam  (VALIUM ) 5  MG tablet Take 1 tablet (5 mg total) by mouth every 6 (six) hours as needed for muscle spasms. (Patient not taking: Reported on 11/02/2023) 30 tablet 1   EPINEPHrine  0.3 mg/0.3 mL IJ SOAJ injection Inject 0.3 mg into the muscle as needed for anaphylaxis. (Patient taking differently: Inject 0.3 mg into the muscle as needed for anaphylaxis. On hand for allergy shots) 1 each 1   ezetimibe (ZETIA) 10 MG tablet Take 10 mg by mouth every evening.     famotidine  (PEPCID ) 20 MG tablet Take 1 tablet (20 mg total) by mouth daily. (Patient taking differently: Take 20 mg by mouth daily as needed for heartburn or indigestion.)     Fezolinetant (VEOZAH) 45 MG TABS Take 45 mg by mouth in the morning.     lidocaine -prilocaine  (EMLA ) cream Apply to affected area once 30 g 3   methocarbamol  (ROBAXIN ) 500 MG tablet Take 1 tablet (500 mg total) by mouth 4 (four) times daily as needed for muscle spasms. 60 tablet 0   nystatin -triamcinolone (MYCOLOG II) cream Apply 1 Application topically 2 (two) times daily as needed (irritation).     ondansetron  (ZOFRAN ) 8 MG tablet Take 1 tablet (8 mg total) by mouth every 8 (eight) hours as needed for nausea or vomiting. Start on the third day after chemotherapy. 30 tablet 1   prochlorperazine  (COMPAZINE ) 10 MG tablet Take 1 tablet (10 mg total) by mouth every 6 (six) hours as needed for nausea or vomiting. 30 tablet 1   sertraline  (ZOLOFT ) 50 MG tablet Take 50 mg by mouth in the morning.     tacrolimus (PROTOPIC) 0.03 % ointment Apply 1 Application topically 2 (two) times daily as needed (skin irritation.).     TIROSINT  100 MCG CAPS Take 100 mcg by mouth See admin instructions. Take 1 capsule (100 mcg) by mouth every 3 days, alternating with (Tirosint  88 mcg)--take on an empty stomach.     TIROSINT  88 MCG CAPS Take 88 mcg by mouth See admin instructions. Take 1 capsule (88 mcg) by mouth 2 days in a row, alternating with Tirosint  (100 mcg) on the 3 rd day.     triamcinolone ointment  (KENALOG) 0.1 % Apply 1 Application topically 2 (two) times daily as needed (skin irritation.).     valACYclovir (VALTREX) 500 MG tablet Take 500 mg by mouth 2 (two) times daily as needed (cold sore/fever blisters).     No current facility-administered medications for this visit.    PHYSICAL EXAMINATION: ECOG PERFORMANCE STATUS: 0 - Asymptomatic  Vitals:   11/10/23 1222  BP: 116/64  Pulse: 94  Resp: 16  Temp: 98.1 F (36.7 C)  SpO2: 98%   Wt Readings from Last 3 Encounters:  11/10/23 168 lb 4.8 oz (76.3 kg)  11/04/23 167 lb 5.3 oz (75.9 kg)  11/03/23 168 lb 1.6 oz (76.2 kg)     GENERAL:alert, no distress and comfortable SKIN: skin color, texture, turgor are normal, no rashes or significant lesions EYES: normal, Conjunctiva are pink and non-injected, sclera clear NECK: supple, thyroid  normal size, non-tender, without nodularity LYMPH:  no palpable lymphadenopathy in the cervical, axillary  LUNGS: clear to auscultation  and percussion with normal breathing effort HEART: regular rate & rhythm and no murmurs and no lower extremity edema ABDOMEN:abdomen soft, non-tender and normal bowel sounds Musculoskeletal:no cyanosis of digits and no clubbing  NEURO: alert & oriented x 3 with fluent speech, no focal motor/sensory deficits  Physical Exam BREAST: Port site bruised, not tender to pressure  LABORATORY DATA:  I have reviewed the data as listed    Latest Ref Rng & Units 11/10/2023   12:08 PM 11/03/2023   12:11 PM 12/09/2022   12:02 PM  CBC  WBC 4.0 - 10.5 K/uL 9.2  8.6  8.4   Hemoglobin 12.0 - 15.0 g/dL 85.5  85.5  85.6   Hematocrit 36.0 - 46.0 % 43.5  43.9  43.4   Platelets 150 - 400 K/uL 209  240  270         Latest Ref Rng & Units 11/10/2023   12:08 PM 11/03/2023   12:11 PM 12/09/2022   12:02 PM  CMP  Glucose 70 - 99 mg/dL 854  869  867   BUN 8 - 23 mg/dL 15  8  14    Creatinine 0.44 - 1.00 mg/dL 9.28  9.34  9.32   Sodium 135 - 145 mmol/L 140  140  141   Potassium  3.5 - 5.1 mmol/L 4.5  3.8  3.9   Chloride 98 - 111 mmol/L 104  102  105   CO2 22 - 32 mmol/L 30  25  28    Calcium  8.9 - 10.3 mg/dL 9.6  9.6  9.9   Total Protein 6.5 - 8.1 g/dL 7.8   7.7   Total Bilirubin 0.0 - 1.2 mg/dL 0.6   0.4   Alkaline Phos 38 - 126 U/L 99   113   AST 15 - 41 U/L 22   21   ALT 0 - 44 U/L 33   23       RADIOGRAPHIC STUDIES: I have personally reviewed the radiological images as listed and agreed with the findings in the report. No results found.    Orders Placed This Encounter  Procedures   CBC with Differential (Cancer Center Only)    Standing Status:   Future    Expected Date:   12/23/2023    Expiration Date:   12/22/2024   CMP (Cancer Center only)    Standing Status:   Future    Expected Date:   12/23/2023    Expiration Date:   12/22/2024   T4    Standing Status:   Future    Expected Date:   12/23/2023    Expiration Date:   12/22/2024   TSH    Standing Status:   Future    Expected Date:   12/23/2023    Expiration Date:   12/22/2024   CBC with Differential (Cancer Center Only)    Standing Status:   Future    Expected Date:   12/30/2023    Expiration Date:   12/29/2024   CMP (Cancer Center only)    Standing Status:   Future    Expected Date:   12/30/2023    Expiration Date:   12/29/2024   CBC with Differential (Cancer Center Only)    Standing Status:   Future    Expected Date:   01/06/2024    Expiration Date:   01/05/2025   CMP (Cancer Center only)    Standing Status:   Future    Expected Date:   01/06/2024    Expiration Date:  01/05/2025   All questions were answered. The patient knows to call the clinic with any problems, questions or concerns. No barriers to learning was detected. The total time spent in the appointment was 30 minutes, including review of chart and various tests results, discussions about plan of care and coordination of care plan     Onita Mattock, MD 11/10/2023

## 2023-11-10 NOTE — Assessment & Plan Note (Signed)
-  diagnosed in 12/05/2022, ER-/PR-, g2 -s/p left lumpectomy by Dr. Donell Beers on January 01, 2023.  Surgical path showed intermediate grade DCIS with necrosis, margins were negative. -She has completed adjuvant radiation -No role for adjuvant antiestrogen therapy -Continue breast cancer surveillance

## 2023-11-10 NOTE — Assessment & Plan Note (Addendum)
 IDC and DCIS, Stage IA, pT1b, cN0, triple negative, Grade 3 -found on screening mammogram. S/p lumpectomy on 01/08/22 with Dr. Aron, path showed: 8 mm invasive and in situ ductal carcinoma with negative margins. Repeat prognostic panel confirmed triple negative disease.  -lymph node biopsies 01/28/22 showed negative nodes (0/2). Port placed at time of procedure. Postoperative course complicated by pneumothorax, which has resolved. -she began adjuvant TC on 02/20/22, and completed planned 4 cycles on 04/24/2022. -she completed adjuvant RT on 07/09/2022  -biopsy confirmed chest lymph nodes (axilla and chest wall including posteriad pectoralis muscle)  recurrence in 10/2023 - Case reviewed in breast tumor board, plan to start neoadjuvant chemotherapy with carboplatin and paclitaxel every 3 weeks for 4 cycles, followed by AC every 3 weeks for 4 cycles, along with immunotherapy Keytruda for 1 year.  If she has good response to treatment, Dr. Aron will consider surgery although she is not able to remove the aubpectoral lymph node.  She may need adjuvant radiation after surgery.

## 2023-11-11 ENCOUNTER — Ambulatory Visit: Payer: Self-pay | Admitting: Hematology

## 2023-11-11 DIAGNOSIS — E039 Hypothyroidism, unspecified: Secondary | ICD-10-CM | POA: Diagnosis not present

## 2023-11-11 NOTE — Telephone Encounter (Signed)
 Spoke with patient appt converted. Will have labs completed prior to appt.

## 2023-11-12 ENCOUNTER — Other Ambulatory Visit: Payer: Self-pay

## 2023-11-13 ENCOUNTER — Encounter: Payer: Self-pay | Admitting: Hematology

## 2023-11-13 ENCOUNTER — Inpatient Hospital Stay: Attending: Hematology

## 2023-11-13 VITALS — BP 128/72 | HR 70 | Temp 100.0°F | Resp 18 | Wt 168.2 lb

## 2023-11-13 DIAGNOSIS — T451X5A Adverse effect of antineoplastic and immunosuppressive drugs, initial encounter: Secondary | ICD-10-CM | POA: Diagnosis not present

## 2023-11-13 DIAGNOSIS — K219 Gastro-esophageal reflux disease without esophagitis: Secondary | ICD-10-CM | POA: Diagnosis not present

## 2023-11-13 DIAGNOSIS — Z5111 Encounter for antineoplastic chemotherapy: Secondary | ICD-10-CM | POA: Diagnosis not present

## 2023-11-13 DIAGNOSIS — L309 Dermatitis, unspecified: Secondary | ICD-10-CM | POA: Insufficient documentation

## 2023-11-13 DIAGNOSIS — F32A Depression, unspecified: Secondary | ICD-10-CM | POA: Diagnosis not present

## 2023-11-13 DIAGNOSIS — Z1722 Progesterone receptor negative status: Secondary | ICD-10-CM | POA: Insufficient documentation

## 2023-11-13 DIAGNOSIS — D701 Agranulocytosis secondary to cancer chemotherapy: Secondary | ICD-10-CM | POA: Diagnosis not present

## 2023-11-13 DIAGNOSIS — Z17421 Hormone receptor negative with human epidermal growth factor receptor 2 negative status: Secondary | ICD-10-CM | POA: Insufficient documentation

## 2023-11-13 DIAGNOSIS — D0512 Intraductal carcinoma in situ of left breast: Secondary | ICD-10-CM | POA: Insufficient documentation

## 2023-11-13 DIAGNOSIS — R232 Flushing: Secondary | ICD-10-CM | POA: Insufficient documentation

## 2023-11-13 DIAGNOSIS — D6481 Anemia due to antineoplastic chemotherapy: Secondary | ICD-10-CM | POA: Diagnosis not present

## 2023-11-13 DIAGNOSIS — N6331 Unspecified lump in axillary tail of the right breast: Secondary | ICD-10-CM | POA: Diagnosis not present

## 2023-11-13 DIAGNOSIS — Z5112 Encounter for antineoplastic immunotherapy: Secondary | ICD-10-CM | POA: Diagnosis not present

## 2023-11-13 DIAGNOSIS — M898X9 Other specified disorders of bone, unspecified site: Secondary | ICD-10-CM | POA: Diagnosis not present

## 2023-11-13 DIAGNOSIS — E039 Hypothyroidism, unspecified: Secondary | ICD-10-CM | POA: Insufficient documentation

## 2023-11-13 DIAGNOSIS — Z9089 Acquired absence of other organs: Secondary | ICD-10-CM | POA: Insufficient documentation

## 2023-11-13 DIAGNOSIS — Z79633 Long term (current) use of mitotic inhibitor: Secondary | ICD-10-CM | POA: Insufficient documentation

## 2023-11-13 DIAGNOSIS — N951 Menopausal and female climacteric states: Secondary | ICD-10-CM | POA: Insufficient documentation

## 2023-11-13 DIAGNOSIS — K1230 Oral mucositis (ulcerative), unspecified: Secondary | ICD-10-CM | POA: Insufficient documentation

## 2023-11-13 DIAGNOSIS — R5383 Other fatigue: Secondary | ICD-10-CM | POA: Diagnosis not present

## 2023-11-13 DIAGNOSIS — R7401 Elevation of levels of liver transaminase levels: Secondary | ICD-10-CM | POA: Insufficient documentation

## 2023-11-13 DIAGNOSIS — Z888 Allergy status to other drugs, medicaments and biological substances status: Secondary | ICD-10-CM | POA: Insufficient documentation

## 2023-11-13 DIAGNOSIS — R09A0 Foreign body sensation, unspecified: Secondary | ICD-10-CM | POA: Diagnosis not present

## 2023-11-13 DIAGNOSIS — Z882 Allergy status to sulfonamides status: Secondary | ICD-10-CM | POA: Insufficient documentation

## 2023-11-13 DIAGNOSIS — C50411 Malignant neoplasm of upper-outer quadrant of right female breast: Secondary | ICD-10-CM | POA: Insufficient documentation

## 2023-11-13 DIAGNOSIS — M47814 Spondylosis without myelopathy or radiculopathy, thoracic region: Secondary | ICD-10-CM | POA: Diagnosis not present

## 2023-11-13 DIAGNOSIS — Z79899 Other long term (current) drug therapy: Secondary | ICD-10-CM | POA: Insufficient documentation

## 2023-11-13 DIAGNOSIS — Z79630 Long term (current) use of alkylating agent: Secondary | ICD-10-CM | POA: Diagnosis not present

## 2023-11-13 DIAGNOSIS — Z171 Estrogen receptor negative status [ER-]: Secondary | ICD-10-CM | POA: Diagnosis not present

## 2023-11-13 MED ORDER — PALONOSETRON HCL INJECTION 0.25 MG/5ML
0.2500 mg | Freq: Once | INTRAVENOUS | Status: AC
  Administered 2023-11-13: 0.25 mg via INTRAVENOUS
  Filled 2023-11-13: qty 5

## 2023-11-13 MED ORDER — SODIUM CHLORIDE 0.9 % IV SOLN: INTRAVENOUS | Status: DC

## 2023-11-13 MED ORDER — SODIUM CHLORIDE 0.9 % IV SOLN
571.5000 mg | Freq: Once | INTRAVENOUS | Status: AC
  Administered 2023-11-13: 570 mg via INTRAVENOUS
  Filled 2023-11-13: qty 57

## 2023-11-13 MED ORDER — FAMOTIDINE IN NACL 20-0.9 MG/50ML-% IV SOLN
20.0000 mg | Freq: Once | INTRAVENOUS | Status: AC
  Administered 2023-11-13: 20 mg via INTRAVENOUS
  Filled 2023-11-13: qty 50

## 2023-11-13 MED ORDER — SODIUM CHLORIDE 0.9 % IV SOLN
200.0000 mg | Freq: Once | INTRAVENOUS | Status: AC
  Administered 2023-11-13: 200 mg via INTRAVENOUS
  Filled 2023-11-13: qty 200

## 2023-11-13 MED ORDER — SODIUM CHLORIDE 0.9 % IV SOLN
80.0000 mg/m2 | Freq: Once | INTRAVENOUS | Status: AC
  Administered 2023-11-13: 156 mg via INTRAVENOUS
  Filled 2023-11-13: qty 26

## 2023-11-13 MED ORDER — DIPHENHYDRAMINE HCL 50 MG/ML IJ SOLN
25.0000 mg | Freq: Once | INTRAMUSCULAR | Status: AC
  Administered 2023-11-13: 25 mg via INTRAVENOUS
  Filled 2023-11-13: qty 1

## 2023-11-13 MED ORDER — APREPITANT 130 MG/18ML IV EMUL
130.0000 mg | Freq: Once | INTRAVENOUS | Status: AC
  Administered 2023-11-13: 130 mg via INTRAVENOUS
  Filled 2023-11-13: qty 18

## 2023-11-13 NOTE — Patient Instructions (Signed)
 [facility] - A DEPT OF [facility]  Discharge Instructions: Thank you for choosing [facility] to provide your oncology and hematology care.   If you have a lab appointment with the Cancer Center, please go directly to the Cancer Center and check in at the registration area.   Wear comfortable clothing and clothing appropriate for easy access to any Portacath or PICC line.   We strive to give you quality time with your provider. You may need to reschedule your appointment if you arrive late (15 or more minutes).  Arriving late affects you and other patients whose appointments are after yours.  Also, if you miss three or more appointments without notifying the office, you may be dismissed from the clinic at the provider's discretion.      For prescription refill requests, have your pharmacy contact our office and allow 72 hours for refills to be completed.    Today you received the following chemotherapy and/or immunotherapy agents: Keytruda, Taxol, Carboplatin      To help prevent nausea and vomiting after your treatment, we encourage you to take your nausea medication as directed.  BELOW ARE SYMPTOMS THAT SHOULD BE REPORTED IMMEDIATELY: *FEVER GREATER THAN 100.4 F (38 C) OR HIGHER *CHILLS OR SWEATING *NAUSEA AND VOMITING THAT IS NOT CONTROLLED WITH YOUR NAUSEA MEDICATION *UNUSUAL SHORTNESS OF BREATH *UNUSUAL BRUISING OR BLEEDING *URINARY PROBLEMS (pain or burning when urinating, or frequent urination) *BOWEL PROBLEMS (unusual diarrhea, constipation, pain near the anus) TENDERNESS IN MOUTH AND THROAT WITH OR WITHOUT PRESENCE OF ULCERS (sore throat, sores in mouth, or a toothache) UNUSUAL RASH, SWELLING OR PAIN  UNUSUAL VAGINAL DISCHARGE OR ITCHING   Items with * indicate a potential emergency and should be followed up as soon as possible or go to the Emergency Department if any problems should occur.  Please show the CHEMOTHERAPY ALERT  CARD or IMMUNOTHERAPY ALERT CARD at check-in to the Emergency Department and triage nurse.  Should you have questions after your visit or need to cancel or reschedule your appointment, please contact [facility] - A DEPT OF [facility]  Dept: [phone]  and follow the prompts.  Office hours are 8:00 a.m. to 4:30 p.m. Monday - Friday. Please note that voicemails left after 4:00 p.m. may not be returned until the following business day.  We are closed weekends and major holidays. You have access to a nurse at all times for urgent questions. Please call the main number to the clinic Dept: [phone] and follow the prompts.   For any non-urgent questions, you may also contact your provider using MyChart. We now offer e-Visits for anyone 40 and older to request care online for non-urgent symptoms. For details visit [email or web address].   Also download the MyChart app! Go to the app store, search "MyChart", open the app, select [place], and log in with your MyChart username and password.

## 2023-11-13 NOTE — Telephone Encounter (Addendum)
 Called patient to relay results below as per Dr.Feng will fax over the results to her PCP Dr. [name]. Patient did not answer the phone left a vm with information and return phone number if she has any further questions.    ----- Message from Onita Mattock sent at 11/11/2023  7:02 AM EDT ----- Please let pt know her TSH and T4 result, and send to her PCP to see of they want adjust her synthroid  dose. Thx   Onita Mattock  ----- Message ----- From: Rebecka, Lab In [place] Sent: 11/10/2023  12:26 PM EDT To: Onita Mattock, MD

## 2023-11-13 NOTE — Telephone Encounter (Signed)
-----   Message from Onita Mattock sent at 11/11/2023  7:02 AM EDT ----- Please let pt know her TSH and T4 result, and send to her PCP to see of they want adjust her synthroid  dose. Thx   Onita Mattock  ----- Message ----- From: Rebecka, Lab In [place] Sent: 11/10/2023  12:26 PM EDT To: Onita Mattock, MD

## 2023-11-16 ENCOUNTER — Other Ambulatory Visit: Payer: Self-pay | Admitting: Hematology

## 2023-11-16 ENCOUNTER — Telehealth: Payer: Self-pay

## 2023-11-16 NOTE — Telephone Encounter (Signed)
 Pt called requesting if Dr. Lanny could change the Diphenhydramine  in the pt's chemo tx plan to Zyrtec  d/t the Diphenhydramine  makes the pt extremely sleepy.  Pt stated she was extremely sleepy throughout the entire weekend from Diphenhydramine .  Stated this nurse will make Dr. Lanny aware of the pt's request.

## 2023-11-18 ENCOUNTER — Other Ambulatory Visit: Payer: Self-pay

## 2023-11-18 ENCOUNTER — Encounter: Payer: Self-pay | Admitting: Hematology

## 2023-11-18 ENCOUNTER — Telehealth: Payer: Self-pay

## 2023-11-18 ENCOUNTER — Other Ambulatory Visit (HOSPITAL_COMMUNITY): Payer: Self-pay

## 2023-11-18 MED ORDER — NYSTATIN 100000 UNIT/ML MT SUSP: 5.0000 mL | Freq: Four times a day (QID) | OROMUCOSAL | 0 refills | Status: DC | PRN

## 2023-11-18 MED ORDER — NYSTATIN 100000 UNIT/ML MT SUSP
5.0000 mL | Freq: Four times a day (QID) | OROMUCOSAL | 0 refills | Status: DC | PRN
  Filled 2023-11-18: qty 240, 12d supply, fill #0

## 2023-11-18 NOTE — Telephone Encounter (Signed)
 Mrs. Brogdon called regarding thrush. Magic mouthwash prescription refilled per Mallie ORN PA-C.

## 2023-11-18 NOTE — Assessment & Plan Note (Signed)
 IDC and DCIS, Stage IA, pT1b, cN0, triple negative, Grade 3, chest wall recurrence in 10/2023  -found on screening mammogram. S/p lumpectomy on 01/08/22 with Dr. Aron, path showed: 8 mm invasive and in situ ductal carcinoma with negative margins. Repeat prognostic panel confirmed triple negative disease.  -lymph node biopsies 01/28/22 showed negative nodes (0/2). Port placed at time of procedure. Postoperative course complicated by pneumothorax, which has resolved. -she began adjuvant TC on 02/20/22, and completed planned 4 cycles on 04/24/2022. -she completed adjuvant RT on 07/09/2022  -biopsy confirmed chest lymph nodes (axilla and chest wall including posteriad pectoralis muscle)  recurrence in 10/2023 - Case reviewed in breast tumor board, plan to start neoadjuvant chemotherapy with carboplatin  and paclitaxel  every 3 weeks for 4 cycles, followed by AC every 3 weeks for 4 cycles, along with immunotherapy Keytruda  for 1 year.  If she has good response to treatment, Dr. Aron will consider surgery although she is not able to remove the aubpectoral lymph node.  She may need adjuvant radiation after surgery.

## 2023-11-18 NOTE — Telephone Encounter (Signed)
 This RN returned V/M received from Mrs.Burkett regarding thrush on her tongue. Patient sent picture in via mychart message. Mallie ORN PA-C made aware in [facility]. Magic mouthwash prescription refilled per Mallie ORN PA-C.

## 2023-11-18 NOTE — Telephone Encounter (Signed)
 Preferred pharmacy does not compound medications, prescription sent to [facility]. Patient made aware.

## 2023-11-19 ENCOUNTER — Inpatient Hospital Stay: Admitting: Hematology

## 2023-11-19 ENCOUNTER — Inpatient Hospital Stay

## 2023-11-19 ENCOUNTER — Encounter: Payer: Self-pay | Admitting: Hematology

## 2023-11-19 VITALS — BP 132/76 | HR 88 | Temp 98.0°F | Resp 16 | Ht 67.0 in | Wt 167.2 lb

## 2023-11-19 DIAGNOSIS — D0512 Intraductal carcinoma in situ of left breast: Secondary | ICD-10-CM | POA: Diagnosis not present

## 2023-11-19 DIAGNOSIS — Z79630 Long term (current) use of alkylating agent: Secondary | ICD-10-CM | POA: Diagnosis not present

## 2023-11-19 DIAGNOSIS — M47814 Spondylosis without myelopathy or radiculopathy, thoracic region: Secondary | ICD-10-CM | POA: Diagnosis not present

## 2023-11-19 DIAGNOSIS — M898X9 Other specified disorders of bone, unspecified site: Secondary | ICD-10-CM | POA: Diagnosis not present

## 2023-11-19 DIAGNOSIS — Z5112 Encounter for antineoplastic immunotherapy: Secondary | ICD-10-CM | POA: Diagnosis not present

## 2023-11-19 DIAGNOSIS — D701 Agranulocytosis secondary to cancer chemotherapy: Secondary | ICD-10-CM | POA: Diagnosis not present

## 2023-11-19 DIAGNOSIS — Z17421 Hormone receptor negative with human epidermal growth factor receptor 2 negative status: Secondary | ICD-10-CM | POA: Diagnosis not present

## 2023-11-19 DIAGNOSIS — R5383 Other fatigue: Secondary | ICD-10-CM | POA: Diagnosis not present

## 2023-11-19 DIAGNOSIS — Z171 Estrogen receptor negative status [ER-]: Secondary | ICD-10-CM

## 2023-11-19 DIAGNOSIS — R7401 Elevation of levels of liver transaminase levels: Secondary | ICD-10-CM | POA: Diagnosis not present

## 2023-11-19 DIAGNOSIS — C50411 Malignant neoplasm of upper-outer quadrant of right female breast: Secondary | ICD-10-CM | POA: Diagnosis not present

## 2023-11-19 DIAGNOSIS — R09A0 Foreign body sensation, unspecified: Secondary | ICD-10-CM | POA: Diagnosis not present

## 2023-11-19 DIAGNOSIS — R232 Flushing: Secondary | ICD-10-CM | POA: Diagnosis not present

## 2023-11-19 DIAGNOSIS — Z5111 Encounter for antineoplastic chemotherapy: Secondary | ICD-10-CM | POA: Diagnosis not present

## 2023-11-19 DIAGNOSIS — T451X5A Adverse effect of antineoplastic and immunosuppressive drugs, initial encounter: Secondary | ICD-10-CM | POA: Diagnosis not present

## 2023-11-19 DIAGNOSIS — L309 Dermatitis, unspecified: Secondary | ICD-10-CM | POA: Diagnosis not present

## 2023-11-19 DIAGNOSIS — Z95828 Presence of other vascular implants and grafts: Secondary | ICD-10-CM

## 2023-11-19 DIAGNOSIS — N6331 Unspecified lump in axillary tail of the right breast: Secondary | ICD-10-CM | POA: Diagnosis not present

## 2023-11-19 DIAGNOSIS — Z1722 Progesterone receptor negative status: Secondary | ICD-10-CM | POA: Diagnosis not present

## 2023-11-19 DIAGNOSIS — D6481 Anemia due to antineoplastic chemotherapy: Secondary | ICD-10-CM | POA: Diagnosis not present

## 2023-11-19 DIAGNOSIS — K219 Gastro-esophageal reflux disease without esophagitis: Secondary | ICD-10-CM | POA: Diagnosis not present

## 2023-11-19 DIAGNOSIS — F32A Depression, unspecified: Secondary | ICD-10-CM | POA: Diagnosis not present

## 2023-11-19 DIAGNOSIS — N951 Menopausal and female climacteric states: Secondary | ICD-10-CM | POA: Diagnosis not present

## 2023-11-19 DIAGNOSIS — E039 Hypothyroidism, unspecified: Secondary | ICD-10-CM | POA: Diagnosis not present

## 2023-11-19 DIAGNOSIS — K1230 Oral mucositis (ulcerative), unspecified: Secondary | ICD-10-CM | POA: Diagnosis not present

## 2023-11-19 LAB — CMP (CANCER CENTER ONLY)
ALT: 50 U/L — ABNORMAL HIGH (ref 0–44)
AST: 29 U/L (ref 15–41)
Albumin: 4.4 g/dL (ref 3.5–5.0)
Alkaline Phosphatase: 98 U/L (ref 38–126)
Anion gap: 5 (ref 5–15)
BUN: 15 mg/dL (ref 8–23)
CO2: 31 mmol/L (ref 22–32)
Calcium: 9.2 mg/dL (ref 8.9–10.3)
Chloride: 103 mmol/L (ref 98–111)
Creatinine: 0.62 mg/dL (ref 0.44–1.00)
GFR, Estimated: >60 mL/min (ref >60)
Glucose, Bld: 110 mg/dL — ABNORMAL HIGH (ref 70–99)
Potassium: 3.9 mmol/L (ref 3.5–5.1)
Sodium: 139 mmol/L (ref 135–145)
Total Bilirubin: 0.6 mg/dL (ref 0.0–1.2)
Total Protein: 7.2 g/dL (ref 6.5–8.1)

## 2023-11-19 LAB — CBC WITH DIFFERENTIAL (CANCER CENTER ONLY)
Abs Immature Granulocytes: 0.01 K/uL (ref 0.00–0.07)
Basophils Absolute: 0 K/uL (ref 0.0–0.1)
Basophils Relative: 1 %
Eosinophils Absolute: 0 K/uL (ref 0.0–0.5)
Eosinophils Relative: 1 %
HCT: 40.6 % (ref 36.0–46.0)
Hemoglobin: 13.4 g/dL (ref 12.0–15.0)
Immature Granulocytes: 0 %
Lymphocytes Relative: 26 %
Lymphs Abs: 0.9 K/uL (ref 0.7–4.0)
MCH: 30.9 pg (ref 26.0–34.0)
MCHC: 33 g/dL (ref 30.0–36.0)
MCV: 93.5 fL (ref 80.0–100.0)
Monocytes Absolute: 0.1 K/uL (ref 0.1–1.0)
Monocytes Relative: 3 %
Neutro Abs: 2.6 K/uL (ref 1.7–7.7)
Neutrophils Relative %: 69 %
Platelet Count: 162 K/uL (ref 150–400)
RBC: 4.34 MIL/uL (ref 3.87–5.11)
RDW: 12.6 % (ref 11.5–15.5)
WBC Count: 3.7 K/uL — ABNORMAL LOW (ref 4.0–10.5)
nRBC: 0 % (ref 0.0–0.2)

## 2023-11-19 MED ORDER — SODIUM CHLORIDE 0.9 % IV SOLN
80.0000 mg/m2 | Freq: Once | INTRAVENOUS | Status: AC
  Administered 2023-11-19: 156 mg via INTRAVENOUS
  Filled 2023-11-19: qty 26

## 2023-11-19 MED ORDER — SODIUM CHLORIDE 0.9% FLUSH
10.0000 mL | Freq: Once | INTRAVENOUS | Status: AC
Start: 2023-11-19 — End: 2023-11-19
  Administered 2023-11-19: 10 mL

## 2023-11-19 MED ORDER — FAMOTIDINE IN NACL 20-0.9 MG/50ML-% IV SOLN
20.0000 mg | Freq: Once | INTRAVENOUS | Status: AC
  Administered 2023-11-19: 20 mg via INTRAVENOUS
  Filled 2023-11-19: qty 50

## 2023-11-19 MED ORDER — CETIRIZINE HCL 10 MG PO TABS
10.0000 mg | ORAL_TABLET | Freq: Once | ORAL | Status: AC
  Administered 2023-11-19: 10 mg via ORAL
  Filled 2023-11-19: qty 1

## 2023-11-19 MED ORDER — SODIUM CHLORIDE 0.9 % IV SOLN: INTRAVENOUS | Status: DC

## 2023-11-19 NOTE — Patient Instructions (Signed)

## 2023-11-19 NOTE — Progress Notes (Signed)
 [facility]   Telephone:([phone]) [phone] [phone]) [phone]   Clinic Follow up Note   Patient Care Team: [name], MD as PCP - General (Internal Medicine) Glean Stephane BROCKS, RN (Inactive) as Oncology Nurse Navigator [name], RN as Oncology Nurse Navigator [name], MD as Consulting Physician (General Surgery) [name], MD as Consulting Physician (Hematology) [name], MD as Consulting Physician (Radiation Oncology) [name], MD as Consulting Physician (Endocrinology) [name], MD as Consulting Physician (Sports Medicine) [name], MD as Consulting Physician (Obstetrics and Gynecology) [name], [name], NP as Nurse Practitioner (Nurse Practitioner) Imaging, [facility] as Radiologist (Diagnostic Radiology)  Date of Service:  11/19/2023  CHIEF COMPLAINT: f/u of recurrent breast cancer  CURRENT THERAPY:  Chemotherapy with weekly carboplatin , paclitaxel , and Keytruda  every 3 weeks  Oncology History   Malignant neoplasm of upper-outer quadrant of right breast in female, estrogen receptor negative (HCC) IDC and DCIS, Stage IA, pT1b, cN0, triple negative, Grade 3, chest wall recurrence in 10/2023  -found on screening mammogram. S/p lumpectomy on 01/08/22 with Dr. Aron, path showed: 8 mm invasive and in situ ductal carcinoma with negative margins. Repeat prognostic panel confirmed triple negative disease.  -lymph node biopsies 01/28/22 showed negative nodes (0/2). Port placed at time of procedure. Postoperative course complicated by pneumothorax, which has resolved. -she began adjuvant TC on 02/20/22, and completed planned 4 cycles on 04/24/2022. -she completed adjuvant RT on 07/09/2022  -biopsy confirmed chest lymph nodes (axilla and chest wall including posteriad pectoralis muscle)  recurrence in 10/2023 - Case reviewed in breast tumor board, plan to start neoadjuvant chemotherapy with carboplatin  and paclitaxel  every 3  weeks for 4 cycles, followed by AC every 3 weeks for 4 cycles, along with immunotherapy Keytruda  for 1 year.  If she has good response to treatment, Dr. Aron will consider surgery although she is not able to remove the aubpectoral lymph node.  She may need adjuvant radiation after surgery.  Assessment & Plan Breast cancer on chemotherapy Currently undergoing weekly chemotherapy for breast cancer. Experiencing fatigue and queasiness post-treatment, but no significant nausea or diarrhea. Liver function slightly abnormal due to chemotherapy. No fever or chills reported. Port site appears normal, though she reports feeling tense and tightness, which is common due to the foreign body sensation. - Proceed with scheduled chemotherapy treatment today. - Switch Benadryl  to Zyrtec  to manage side effects. - Allow use of Pepcid  for stomach irritation. - Consider reducing frequency of visits to every other week if treatment is well-tolerated.  Chemotherapy-induced leukopenia White blood cell count is slightly low at 3.7, attributed to chemotherapy. Other blood counts are normal.  Chemotherapy-induced mild transaminitis ALT levels slightly abnormal, likely due to chemotherapy. Discussed potential impact of Veozah on liver function, but she reports hot flashes are manageable without it. - Discontinue Veozah to avoid overtaxing the liver.  Menopausal symptoms (hot flashes) Experiencing hot flashes, but they are not severe enough to require medication if not engaging in strenuous activities. - Discontinue Veozah as hot flashes are manageable without it.  Plan - Labs reviewed, adequate for treatment, will proceed second week of chemo today and continue weekly - Follow-up in 2 weeks before chemo   SUMMARY OF ONCOLOGIC HISTORY: Oncology History Overview Note   Cancer Staging  Malignant neoplasm of upper-outer quadrant of right breast in female, estrogen receptor negative Staging form: Breast, AJCC 8th  Edition - Clinical stage from 12/23/2021: cT1b, cM0, G3, ER-, PR-, HER2- - Unsigned Stage  prefix: Initial diagnosis Histologic grading system: 3 grade system - Pathologic stage from 01/08/2022: Stage IB (pT1b, pN0, cM0, G3, ER-, PR-, HER2-) - Signed by [name], MD on 01/24/2022 Stage prefix: Initial diagnosis Histologic grading system: 3 grade system Residual tumor (R): R0 - None     Malignant neoplasm of upper-outer quadrant of right breast in female, estrogen receptor negative (HCC)  12/06/2021 Mammogram   CLINICAL DATA:  Patient returns today to evaluate RIGHT breast calcifications identified on a recent screening mammogram.   EXAM: DIGITAL DIAGNOSTIC UNILATERAL RIGHT MAMMOGRAM  IMPRESSION: Grouped coarse heterogeneous calcifications within the upper-outer quadrant of the RIGHT breast, measuring 6 mm extent. These may be fibroadenomatous calcifications. Stereotactic biopsy is recommended to exclude malignancy.   12/18/2021 Initial Biopsy   Diagnosis Breast, right, needle core biopsy, upper outer quadrant, x clip - DUCTAL CARCINOMA IN SITU, SOLID AND CRIBRIFORM TYPE WITH COMEDONECROSIS AND ASSOCIATED CALCIFICATIONS, NUCLEAR GRADE 3 OF 3 - FOCAL MICROINVASION IS PRESENT (LESS THAN 1 MM) WITH EVIDENCE OF LYMPHOVASCULAR INVASION - NECROSIS: PRESENT - CALCIFICATIONS: PRESENT - DCIS LENGTH: 7 MM IN GREATEST LINEAR DIMENSION ON FRAGMENTED CORES  PROGNOSTIC INDICATORS Results: IMMUNOHISTOCHEMICAL AND MORPHOMETRIC ANALYSIS PERFORMED MANUALLY The tumor cells are NEGATIVE for Her2 (1+). Estrogen Receptor: 0%, NEGATIVE Progesterone Receptor: 0%, NEGATIVE Proliferation Marker Ki67: 60%   12/23/2021 Initial Diagnosis   Malignant neoplasm of upper-outer quadrant of right breast in female, estrogen receptor negative (HCC)   12/23/2021 Imaging   EXAM: ULTRASOUND OF THE RIGHT AXILLA  IMPRESSION: No abnormal appearing RIGHT axillary lymph nodes.   01/08/2022 Cancer Staging   Staging form:  Breast, AJCC 8th Edition - Pathologic stage from 01/08/2022: Stage IB (pT1b, pN0, cM0, G3, ER-, PR-, HER2-) - Signed by [name], MD on 01/24/2022 Stage prefix: Initial diagnosis Histologic grading system: 3 grade system Residual tumor (R): R0 - None   02/20/2022 - 04/24/2022 Chemotherapy   Patient is on Treatment Plan : BREAST TC q21d     10/06/2022 Survivorship   SCP delivered by Lacie [name], NP   11/13/2023 -  Chemotherapy   Patient is on Treatment Plan : BREAST Pembrolizumab  (200) D1 + Carboplatin  (5) D1 + Paclitaxel  (80) D1,8,15 q21d X 4 cycles / Pembrolizumab  (200) D1 + AC D1 q21d x 4 cycles     Ductal carcinoma in situ (DCIS) of left breast  12/09/2022 Initial Diagnosis   Ductal carcinoma in situ (DCIS) of left breast   01/01/2023 Surgery   Let breast seed localized lumpectomy.with Dr. Aron  2.5 cm DCIS with negative margins.     02/11/2023 - 03/10/2023 Radiation Therapy   Dose per fraction - 2.66 Gy Prescribed dose (delivered/prescribed)  42.56/42.56 Prescribed Fxs (delivered/prescribed)  16/16  Boost  Dose per fraction -  2Gy Prescribed dose (delivered/prescribed) 8Gy/8Gy) Prescribed Fxs (delivered/prescribed) (4Gy/4Gy)        Discussed the use of AI scribe software for clinical note transcription with the patient, who gave verbal consent to proceed.  History of Present Illness Samantha [name] is a 63 year old female with breast cancer who presents for follow-up of her current treatment.  She is undergoing chemotherapy and experienced significant sedation from Benadryl  lasting two days. She felt slightly queasy but did not require medication for nausea. She inquired about using Pepcid  for stomach irritation attributed to chemotherapy. No diarrhea except for a dietary-related episode the previous day. Her white blood cell count is slightly low at 3.7, with normal red blood cell counts in prior labs. Recent labs show  a slightly abnormal ALT. She feels a sensation of  tightness around the port site but denies pain.     All other systems were reviewed with the patient and are negative.  MEDICAL HISTORY:  Past Medical History:  Diagnosis Date   Allergy    Anxiety 2015   Result of culmination of super stressful caregiving and deaths of 2 immediate family members.  Overall do pretty well.   Cancer [facility]) 2023   right breast   Coronary artery calcification    Depression    denies   GERD (gastroesophageal reflux disease)    Hyperlipidemia    Hypothyroidism    Right carpal tunnel syndrome 09/19/2019   Thyroid  disease     SURGICAL HISTORY: Past Surgical History:  Procedure Laterality Date   BREAST BIOPSY Left 12/04/2022   MM LT BREAST BX W LOC DEV 1ST LESION IMAGE BX SPEC STEREO GUIDE 12/04/2022 [facility]   BREAST BIOPSY  12/30/2022   MM LT RADIOACTIVE SEED LOC MAMMO GUIDE 12/30/2022 [facility]   BREAST LUMPECTOMY WITH RADIOACTIVE SEED LOCALIZATION Right 01/08/2022   Procedure: RIGHT BREAST LUMPECTOMY WITH RADIOACTIVE SEED LOCALIZATION;  Surgeon: [name], MD;  Location: [facility] OR;  Service: General;  Laterality: Right;   BREAST LUMPECTOMY WITH RADIOACTIVE SEED LOCALIZATION Left 01/01/2023   Procedure: LEFT BREAST SEED LOCALIZED LUMPECTOMY;  Surgeon: [name], MD;  Location: [facility] OR;  Service: General;  Laterality: Left;   PORT-A-CATH REMOVAL Left 05/20/2022   Procedure: REMOVAL PORT-A-CATH;  Surgeon: [name], MD;  Location: [facility];  Service: General;  Laterality: Left;   PORTACATH PLACEMENT Left 01/28/2022   Procedure: INSERTION PORT-A-CATH;  Surgeon: [name], MD;  Location: [facility];  Service: General;  Laterality: Left;   PORTACATH PLACEMENT Left 11/04/2023   Procedure: INSERTION, TUNNELED CENTRAL VENOUS DEVICE, WITH PORT;  Surgeon: [name], MD;  Location: [facility];  Service: General;  Laterality: Left;  PORT PLACEMENT WITH ULTRASOUND GUIDANCE   SENTINEL NODE  BIOPSY Right 01/28/2022   Procedure: RIGHT SENTINEL LYMPH NODE BIOPSY;  Surgeon: [name], MD;  Location: [facility];  Service: General;  Laterality: Right;   TONSILLECTOMY      I have reviewed the social history and family history with the patient and they are unchanged from previous note.  ALLERGIES:  is allergic to nortriptyline , singulair  [montelukast  sodium], sulfonamide derivatives, amitriptyline  hcl, esomeprazole, fluticasone , gabapentin , lexapro [escitalopram], neomycin, pseudoephedrine, and neosporin [bacitracin-polymyxin b].  MEDICATIONS:  Current Outpatient Medications  Medication Sig Dispense Refill   acetaminophen  (TYLENOL ) 325 MG tablet Take 650 mg by mouth every 6 (six) hours as needed.     ALPRAZolam (XANAX) 0.5 MG tablet Take 0.5 mg by mouth at bedtime as needed for anxiety.     atorvastatin (LIPITOR) 20 MG tablet Take 20 mg by mouth every evening.     betamethasone dipropionate 0.05 % cream Apply 1 Application topically 2 (two) times daily as needed (skin irritation.).     BIOTIN PO Take 1 tablet by mouth 2 (two) times a week. With lunch     Calcium  Carb-Cholecalciferol (CALCIUM -VITAMIN D3) 250-125 MG-UNIT TABS Take 1 tablet by mouth daily with lunch.     Cholecalciferol (VITAMIN D3) 50 MCG (2000 UT) TABS Take 2,000 Units by mouth daily with lunch.     diazepam  (VALIUM ) 5 MG tablet Take 1 tablet (5 mg total) by mouth every 6 (six) hours as needed for muscle spasms. (Patient not taking: Reported on 11/02/2023) 30  tablet 1   EPINEPHrine  0.3 mg/0.3 mL IJ SOAJ injection Inject 0.3 mg into the muscle as needed for anaphylaxis. (Patient taking differently: Inject 0.3 mg into the muscle as needed for anaphylaxis. On hand for allergy shots) 1 each 1   ezetimibe (ZETIA) 10 MG tablet Take 10 mg by mouth every evening.     famotidine  (PEPCID ) 20 MG tablet Take 1 tablet (20 mg total) by mouth daily. (Patient taking differently: Take 20 mg by mouth daily as needed for  heartburn or indigestion.)     Fezolinetant (VEOZAH) 45 MG TABS Take 45 mg by mouth in the morning.     lidocaine -prilocaine  (EMLA ) cream Apply to affected area once 30 g 3   magic mouthwash (nystatin , diphenhydrAMINE , alum & mag hydroxide) suspension mixture Swish and spit 5 mLs 4 (four) times daily as needed for mouth pain. 240 mL 0   methocarbamol  (ROBAXIN ) 500 MG tablet Take 1 tablet (500 mg total) by mouth 4 (four) times daily as needed for muscle spasms. 60 tablet 0   nystatin -triamcinolone (MYCOLOG II) cream Apply 1 Application topically 2 (two) times daily as needed (irritation).     ondansetron  (ZOFRAN ) 8 MG tablet Take 1 tablet (8 mg total) by mouth every 8 (eight) hours as needed for nausea or vomiting. Start on the third day after chemotherapy. 30 tablet 1   prochlorperazine  (COMPAZINE ) 10 MG tablet Take 1 tablet (10 mg total) by mouth every 6 (six) hours as needed for nausea or vomiting. 30 tablet 1   sertraline  (ZOLOFT ) 50 MG tablet Take 50 mg by mouth in the morning.     tacrolimus (PROTOPIC) 0.03 % ointment Apply 1 Application topically 2 (two) times daily as needed (skin irritation.).     TIROSINT  100 MCG CAPS Take 100 mcg by mouth See admin instructions. Take 1 capsule (100 mcg) by mouth every 3 days, alternating with (Tirosint  88 mcg)--take on an empty stomach.     TIROSINT  88 MCG CAPS Take 88 mcg by mouth See admin instructions. Take 1 capsule (88 mcg) by mouth 2 days in a row, alternating with Tirosint  (100 mcg) on the 3 rd day.     triamcinolone ointment (KENALOG) 0.1 % Apply 1 Application topically 2 (two) times daily as needed (skin irritation.).     valACYclovir (VALTREX) 500 MG tablet Take 500 mg by mouth 2 (two) times daily as needed (cold sore/fever blisters).     No current facility-administered medications for this visit.   Facility-Administered Medications Ordered in Other Visits  Medication Dose Route Frequency Provider Last Rate Last Admin   0.9 %  sodium chloride   infusion   Intravenous Continuous [name], MD 10 mL/hr at 11/19/23 1059 New Bag at 11/19/23 1059    PHYSICAL EXAMINATION: ECOG PERFORMANCE STATUS: 1 - Symptomatic but completely ambulatory  Vitals:   11/19/23 0944  BP: 132/76  Pulse: 88  Resp: 16  Temp: 98 F (36.7 C)  SpO2: 99%   Wt Readings from Last 3 Encounters:  11/19/23 167 lb 3.2 oz (75.8 kg)  11/13/23 168 lb 4 oz (76.3 kg)  11/10/23 168 lb 4.8 oz (76.3 kg)     GENERAL:alert, no distress and comfortable SKIN: skin color, texture, turgor are normal, no rashes or significant lesions EYES: normal, Conjunctiva are pink and non-injected, sclera clear Musculoskeletal:no cyanosis of digits and no clubbing  NEURO: alert & oriented x 3 with fluent speech, no focal motor/sensory deficits  Physical Exam   LABORATORY DATA:  I have reviewed  the data as listed    Latest Ref Rng & Units 11/19/2023    9:18 AM 11/10/2023   12:08 PM 11/03/2023   12:11 PM  CBC  WBC 4.0 - 10.5 K/uL 3.7  9.2  8.6   Hemoglobin 12.0 - 15.0 g/dL 86.5  85.5  85.5   Hematocrit 36.0 - 46.0 % 40.6  43.5  43.9   Platelets 150 - 400 K/uL 162  209  240         Latest Ref Rng & Units 11/19/2023    9:18 AM 11/10/2023   12:08 PM 11/03/2023   12:11 PM  CMP  Glucose 70 - 99 mg/dL 889  854  869   BUN 8 - 23 mg/dL 15  15  8    Creatinine 0.44 - 1.00 mg/dL 9.37  9.28  9.34   Sodium 135 - 145 mmol/L 139  140  140   Potassium 3.5 - 5.1 mmol/L 3.9  4.5  3.8   Chloride 98 - 111 mmol/L 103  104  102   CO2 22 - 32 mmol/L 31  30  25    Calcium  8.9 - 10.3 mg/dL 9.2  9.6  9.6   Total Protein 6.5 - 8.1 g/dL 7.2  7.8    Total Bilirubin 0.0 - 1.2 mg/dL 0.6  0.6    Alkaline Phos 38 - 126 U/L 98  99    AST 15 - 41 U/L 29  22    ALT 0 - 44 U/L 50  33        RADIOGRAPHIC STUDIES: I have personally reviewed the radiological images as listed and agreed with the findings in the report. No results found.    No orders of the defined types were placed in this  encounter.  All questions were answered. The patient knows to call the clinic with any problems, questions or concerns. No barriers to learning was detected. The total time spent in the appointment was 25 minutes, including review of chart and various tests results, discussions about plan of care and coordination of care plan     Samantha Mattock, MD 11/19/2023

## 2023-11-25 ENCOUNTER — Other Ambulatory Visit: Payer: Self-pay

## 2023-11-25 ENCOUNTER — Telehealth: Payer: Self-pay

## 2023-11-25 NOTE — Telephone Encounter (Signed)
 This RN returned VM left by Mrs.Gladman regarding diarrhea and what to take for it, she hd c/o 5 episodes yesterday and 3 episodes today. This RN consulted with Mallie ORN PA-C in [facility]. Mallie ORN PA-C recommended Pt try Imodium. This RN educated Pt to try Imodium and take as directed and to notify [facility] if symptoms worsen or if she has 5+ episodes of diarrhea above baseline in a day. Pt verbalized understanding and stated I am going to get Imodium now.

## 2023-11-26 ENCOUNTER — Other Ambulatory Visit: Payer: Self-pay

## 2023-11-26 ENCOUNTER — Encounter: Payer: Self-pay | Admitting: *Deleted

## 2023-11-26 ENCOUNTER — Inpatient Hospital Stay

## 2023-11-26 ENCOUNTER — Inpatient Hospital Stay: Admitting: Hematology

## 2023-11-26 VITALS — BP 133/69 | HR 86 | Temp 98.0°F | Resp 17 | Wt 167.2 lb

## 2023-11-26 DIAGNOSIS — M47814 Spondylosis without myelopathy or radiculopathy, thoracic region: Secondary | ICD-10-CM | POA: Diagnosis not present

## 2023-11-26 DIAGNOSIS — R7401 Elevation of levels of liver transaminase levels: Secondary | ICD-10-CM | POA: Diagnosis not present

## 2023-11-26 DIAGNOSIS — C50411 Malignant neoplasm of upper-outer quadrant of right female breast: Secondary | ICD-10-CM | POA: Diagnosis not present

## 2023-11-26 DIAGNOSIS — Z95828 Presence of other vascular implants and grafts: Secondary | ICD-10-CM

## 2023-11-26 DIAGNOSIS — N6331 Unspecified lump in axillary tail of the right breast: Secondary | ICD-10-CM | POA: Diagnosis not present

## 2023-11-26 DIAGNOSIS — D701 Agranulocytosis secondary to cancer chemotherapy: Secondary | ICD-10-CM | POA: Diagnosis not present

## 2023-11-26 DIAGNOSIS — K1230 Oral mucositis (ulcerative), unspecified: Secondary | ICD-10-CM | POA: Diagnosis not present

## 2023-11-26 DIAGNOSIS — L309 Dermatitis, unspecified: Secondary | ICD-10-CM | POA: Diagnosis not present

## 2023-11-26 DIAGNOSIS — R5383 Other fatigue: Secondary | ICD-10-CM | POA: Diagnosis not present

## 2023-11-26 DIAGNOSIS — E039 Hypothyroidism, unspecified: Secondary | ICD-10-CM | POA: Diagnosis not present

## 2023-11-26 DIAGNOSIS — Z5111 Encounter for antineoplastic chemotherapy: Secondary | ICD-10-CM | POA: Diagnosis not present

## 2023-11-26 DIAGNOSIS — M898X9 Other specified disorders of bone, unspecified site: Secondary | ICD-10-CM | POA: Diagnosis not present

## 2023-11-26 DIAGNOSIS — N951 Menopausal and female climacteric states: Secondary | ICD-10-CM | POA: Diagnosis not present

## 2023-11-26 DIAGNOSIS — K219 Gastro-esophageal reflux disease without esophagitis: Secondary | ICD-10-CM | POA: Diagnosis not present

## 2023-11-26 DIAGNOSIS — Z17421 Hormone receptor negative with human epidermal growth factor receptor 2 negative status: Secondary | ICD-10-CM | POA: Diagnosis not present

## 2023-11-26 DIAGNOSIS — Z171 Estrogen receptor negative status [ER-]: Secondary | ICD-10-CM | POA: Diagnosis not present

## 2023-11-26 DIAGNOSIS — T451X5A Adverse effect of antineoplastic and immunosuppressive drugs, initial encounter: Secondary | ICD-10-CM | POA: Diagnosis not present

## 2023-11-26 DIAGNOSIS — D6481 Anemia due to antineoplastic chemotherapy: Secondary | ICD-10-CM | POA: Diagnosis not present

## 2023-11-26 DIAGNOSIS — R09A0 Foreign body sensation, unspecified: Secondary | ICD-10-CM | POA: Diagnosis not present

## 2023-11-26 DIAGNOSIS — Z79630 Long term (current) use of alkylating agent: Secondary | ICD-10-CM | POA: Diagnosis not present

## 2023-11-26 DIAGNOSIS — Z1722 Progesterone receptor negative status: Secondary | ICD-10-CM | POA: Diagnosis not present

## 2023-11-26 DIAGNOSIS — F32A Depression, unspecified: Secondary | ICD-10-CM | POA: Diagnosis not present

## 2023-11-26 DIAGNOSIS — Z5112 Encounter for antineoplastic immunotherapy: Secondary | ICD-10-CM | POA: Diagnosis not present

## 2023-11-26 DIAGNOSIS — R232 Flushing: Secondary | ICD-10-CM | POA: Diagnosis not present

## 2023-11-26 DIAGNOSIS — D0512 Intraductal carcinoma in situ of left breast: Secondary | ICD-10-CM | POA: Diagnosis not present

## 2023-11-26 LAB — CMP (CANCER CENTER ONLY)
ALT: 53 U/L — ABNORMAL HIGH (ref 0–44)
AST: 29 U/L (ref 15–41)
Albumin: 4.5 g/dL (ref 3.5–5.0)
Alkaline Phosphatase: 106 U/L (ref 38–126)
Anion gap: 5 (ref 5–15)
BUN: 8 mg/dL (ref 8–23)
CO2: 30 mmol/L (ref 22–32)
Calcium: 9.2 mg/dL (ref 8.9–10.3)
Chloride: 106 mmol/L (ref 98–111)
Creatinine: 0.56 mg/dL (ref 0.44–1.00)
GFR, Estimated: >60 mL/min (ref >60)
Glucose, Bld: 99 mg/dL (ref 70–99)
Potassium: 3.9 mmol/L (ref 3.5–5.1)
Sodium: 141 mmol/L (ref 135–145)
Total Bilirubin: 0.4 mg/dL (ref 0.0–1.2)
Total Protein: 7.1 g/dL (ref 6.5–8.1)

## 2023-11-26 LAB — CBC WITH DIFFERENTIAL (CANCER CENTER ONLY)
Abs Immature Granulocytes: 0.01 K/uL (ref 0.00–0.07)
Basophils Absolute: 0 K/uL (ref 0.0–0.1)
Basophils Relative: 1 %
Eosinophils Absolute: 0 K/uL (ref 0.0–0.5)
Eosinophils Relative: 1 %
HCT: 37.5 % (ref 36.0–46.0)
Hemoglobin: 12.5 g/dL (ref 12.0–15.0)
Immature Granulocytes: 0 %
Lymphocytes Relative: 40 %
Lymphs Abs: 0.9 K/uL (ref 0.7–4.0)
MCH: 31.3 pg (ref 26.0–34.0)
MCHC: 33.3 g/dL (ref 30.0–36.0)
MCV: 94 fL (ref 80.0–100.0)
Monocytes Absolute: 0.2 K/uL (ref 0.1–1.0)
Monocytes Relative: 9 %
Neutro Abs: 1.2 K/uL — ABNORMAL LOW (ref 1.7–7.7)
Neutrophils Relative %: 49 %
Platelet Count: 196 K/uL (ref 150–400)
RBC: 3.99 MIL/uL (ref 3.87–5.11)
RDW: 12.9 % (ref 11.5–15.5)
WBC Count: 2.4 K/uL — ABNORMAL LOW (ref 4.0–10.5)
nRBC: 0 % (ref 0.0–0.2)

## 2023-11-26 MED ORDER — SODIUM CHLORIDE 0.9% FLUSH
10.0000 mL | Freq: Once | INTRAVENOUS | Status: AC
  Administered 2023-11-26: 10 mL

## 2023-11-26 MED ORDER — SODIUM CHLORIDE 0.9 % IV SOLN: INTRAVENOUS | Status: DC

## 2023-11-26 MED ORDER — CETIRIZINE HCL 10 MG PO TABS
10.0000 mg | ORAL_TABLET | Freq: Once | ORAL | Status: AC
  Administered 2023-11-26: 10 mg via ORAL
  Filled 2023-11-26: qty 1

## 2023-11-26 MED ORDER — SODIUM CHLORIDE 0.9 % IV SOLN
80.0000 mg/m2 | Freq: Once | INTRAVENOUS | Status: AC
  Administered 2023-11-26: 156 mg via INTRAVENOUS
  Filled 2023-11-26: qty 26

## 2023-11-26 MED ORDER — FAMOTIDINE IN NACL 20-0.9 MG/50ML-% IV SOLN
20.0000 mg | Freq: Once | INTRAVENOUS | Status: AC
  Administered 2023-11-26: 20 mg via INTRAVENOUS
  Filled 2023-11-26: qty 50

## 2023-11-26 NOTE — Patient Instructions (Signed)

## 2023-11-27 ENCOUNTER — Ambulatory Visit

## 2023-11-27 ENCOUNTER — Inpatient Hospital Stay

## 2023-11-27 VITALS — BP 135/72 | HR 78 | Temp 98.3°F | Resp 18

## 2023-11-27 DIAGNOSIS — M47814 Spondylosis without myelopathy or radiculopathy, thoracic region: Secondary | ICD-10-CM | POA: Diagnosis not present

## 2023-11-27 DIAGNOSIS — N6331 Unspecified lump in axillary tail of the right breast: Secondary | ICD-10-CM | POA: Diagnosis not present

## 2023-11-27 DIAGNOSIS — M898X9 Other specified disorders of bone, unspecified site: Secondary | ICD-10-CM | POA: Diagnosis not present

## 2023-11-27 DIAGNOSIS — K1230 Oral mucositis (ulcerative), unspecified: Secondary | ICD-10-CM | POA: Diagnosis not present

## 2023-11-27 DIAGNOSIS — F32A Depression, unspecified: Secondary | ICD-10-CM | POA: Diagnosis not present

## 2023-11-27 DIAGNOSIS — Z17421 Hormone receptor negative with human epidermal growth factor receptor 2 negative status: Secondary | ICD-10-CM | POA: Diagnosis not present

## 2023-11-27 DIAGNOSIS — Z5111 Encounter for antineoplastic chemotherapy: Secondary | ICD-10-CM | POA: Diagnosis not present

## 2023-11-27 DIAGNOSIS — R232 Flushing: Secondary | ICD-10-CM | POA: Diagnosis not present

## 2023-11-27 DIAGNOSIS — D6481 Anemia due to antineoplastic chemotherapy: Secondary | ICD-10-CM | POA: Diagnosis not present

## 2023-11-27 DIAGNOSIS — D701 Agranulocytosis secondary to cancer chemotherapy: Secondary | ICD-10-CM | POA: Diagnosis not present

## 2023-11-27 DIAGNOSIS — R5383 Other fatigue: Secondary | ICD-10-CM | POA: Diagnosis not present

## 2023-11-27 DIAGNOSIS — Z171 Estrogen receptor negative status [ER-]: Secondary | ICD-10-CM

## 2023-11-27 DIAGNOSIS — D0512 Intraductal carcinoma in situ of left breast: Secondary | ICD-10-CM | POA: Diagnosis not present

## 2023-11-27 DIAGNOSIS — Z1722 Progesterone receptor negative status: Secondary | ICD-10-CM | POA: Diagnosis not present

## 2023-11-27 DIAGNOSIS — R09A0 Foreign body sensation, unspecified: Secondary | ICD-10-CM | POA: Diagnosis not present

## 2023-11-27 DIAGNOSIS — R7401 Elevation of levels of liver transaminase levels: Secondary | ICD-10-CM | POA: Diagnosis not present

## 2023-11-27 DIAGNOSIS — L309 Dermatitis, unspecified: Secondary | ICD-10-CM | POA: Diagnosis not present

## 2023-11-27 DIAGNOSIS — C50411 Malignant neoplasm of upper-outer quadrant of right female breast: Secondary | ICD-10-CM | POA: Diagnosis not present

## 2023-11-27 DIAGNOSIS — T451X5A Adverse effect of antineoplastic and immunosuppressive drugs, initial encounter: Secondary | ICD-10-CM | POA: Diagnosis not present

## 2023-11-27 DIAGNOSIS — K219 Gastro-esophageal reflux disease without esophagitis: Secondary | ICD-10-CM | POA: Diagnosis not present

## 2023-11-27 DIAGNOSIS — N951 Menopausal and female climacteric states: Secondary | ICD-10-CM | POA: Diagnosis not present

## 2023-11-27 DIAGNOSIS — E039 Hypothyroidism, unspecified: Secondary | ICD-10-CM | POA: Diagnosis not present

## 2023-11-27 DIAGNOSIS — Z79630 Long term (current) use of alkylating agent: Secondary | ICD-10-CM | POA: Diagnosis not present

## 2023-11-27 DIAGNOSIS — Z5112 Encounter for antineoplastic immunotherapy: Secondary | ICD-10-CM | POA: Diagnosis not present

## 2023-11-27 MED ORDER — FILGRASTIM-SNDZ 480 MCG/0.8ML IJ SOSY
480.0000 ug | PREFILLED_SYRINGE | Freq: Once | INTRAMUSCULAR | Status: AC
  Administered 2023-11-27: 480 ug via SUBCUTANEOUS
  Filled 2023-11-27: qty 0.8

## 2023-11-27 NOTE — Patient Instructions (Signed)
 Filgrastim Injection What is this medication? FILGRASTIM (fil GRA stim) lowers the risk of infection in people who are receiving chemotherapy. It works by Systems analyst make more white blood cells, which protects your body from infection. It may also be used to help people who have been exposed to high doses of radiation. It can be used to help prepare your body before a stem cell transplant. It works by helping your bone marrow make and release stem cells into the blood. This medicine may be used for other purposes; ask your health care provider or pharmacist if you have questions. COMMON BRAND NAME(S): Neupogen, Nivestym, Nypozi, Releuko, Zarxio What should I tell my care team before I take this medication? They need to know if you have any of these conditions: History of blood diseases, such as sickle cell anemia Kidney disease Recent or ongoing radiation An unusual or allergic reaction to filgrastim, pegfilgrastim, latex, rubber, other medications, foods, dyes, or preservatives Pregnant or trying to get pregnant Breast-feeding How should I use this medication? This medication is injected under the skin or into a vein. It is usually given by your care team in a hospital or clinic setting. It may be given at home. If you get this medication at home, you will be taught how to prepare and give it. Use exactly as directed. Take it as directed on the prescription label at the same time every day. Keep taking it unless your care team tells you to stop. It is important that you put your used needles and syringes in a special sharps container. Do not put them in a trash can. If you do not have a sharps container, call your pharmacist or care team to get one. This medication comes with INSTRUCTIONS FOR USE. Ask your pharmacist for directions on how to use this medication. Read the information carefully. Talk to your pharmacist or care team if you have questions. Talk to your care team about the use of  this medication in children. While it may be prescribed for children for selected conditions, precautions do apply. Overdosage: If you think you have taken too much of this medicine contact a poison control center or emergency room at once. NOTE: This medicine is only for you. Do not share this medicine with others. What if I miss a dose? It is important not to miss any doses. Talk to your care team about what to do if you miss a dose. What may interact with this medication? Medications that may cause a release of neutrophils, such as lithium This list may not describe all possible interactions. Give your health care provider a list of all the medicines, herbs, non-prescription drugs, or dietary supplements you use. Also tell them if you smoke, drink alcohol, or use illegal drugs. Some items may interact with your medicine. What should I watch for while using this medication? Your condition will be monitored carefully while you are receiving this medication. You may need bloodwork while taking this medication. Talk to your care team about your risk of cancer. You may be more at risk for certain types of cancer if you take this medication. What side effects may I notice from receiving this medication? Side effects that you should report to your care team as soon as possible: Allergic reactions--skin rash, itching, hives, swelling of the face, lips, tongue, or throat Capillary leak syndrome--stomach or muscle pain, unusual weakness or fatigue, feeling faint or lightheaded, decrease in the amount of urine, swelling of the ankles, hands,  or feet, trouble breathing High white blood cell level--fever, fatigue, trouble breathing, night sweats, change in vision, weight loss Inflammation of the aorta--fever, fatigue, back, chest, or stomach pain, severe headache Kidney injury (glomerulonephritis)--decrease in the amount of urine, red or dark brown urine, foamy or bubbly urine, swelling of the ankles, hands,  or feet Shortness of breath or trouble breathing Spleen injury--pain in upper left stomach or shoulder Unusual bruising or bleeding Side effects that usually do not require medical attention (report to your care team if they continue or are bothersome): Back pain Bone pain Fatigue Fever Headache Nausea This list may not describe all possible side effects. Call your doctor for medical advice about side effects. You may report side effects to FDA at 1-800-FDA-1088. Where should I keep my medication? Keep out of the reach of children and pets. Keep this medication in the original packaging until you are ready to take it. Protect from light. See product for storage information. Each product may have different instructions. Get rid of any unused medication after the expiration date. To get rid of medications that are no longer needed or have expired: Take the medication to a medications take-back program. Check with your pharmacy or law enforcement to find a location. If you cannot return the medication, ask your pharmacist or care team how to get rid of this medication safely. NOTE: This sheet is a summary. It may not cover all possible information. If you have questions about this medicine, talk to your doctor, pharmacist, or health care provider.  2024 Elsevier/Gold Standard (2021-08-22 00:00:00)

## 2023-11-28 ENCOUNTER — Inpatient Hospital Stay

## 2023-11-28 VITALS — BP 138/78 | HR 97 | Temp 98.1°F | Resp 18

## 2023-11-28 DIAGNOSIS — C50411 Malignant neoplasm of upper-outer quadrant of right female breast: Secondary | ICD-10-CM | POA: Diagnosis not present

## 2023-11-28 DIAGNOSIS — D0512 Intraductal carcinoma in situ of left breast: Secondary | ICD-10-CM | POA: Diagnosis not present

## 2023-11-28 DIAGNOSIS — M47814 Spondylosis without myelopathy or radiculopathy, thoracic region: Secondary | ICD-10-CM | POA: Diagnosis not present

## 2023-11-28 DIAGNOSIS — L309 Dermatitis, unspecified: Secondary | ICD-10-CM | POA: Diagnosis not present

## 2023-11-28 DIAGNOSIS — Z17421 Hormone receptor negative with human epidermal growth factor receptor 2 negative status: Secondary | ICD-10-CM | POA: Diagnosis not present

## 2023-11-28 DIAGNOSIS — K1230 Oral mucositis (ulcerative), unspecified: Secondary | ICD-10-CM | POA: Diagnosis not present

## 2023-11-28 DIAGNOSIS — Z5111 Encounter for antineoplastic chemotherapy: Secondary | ICD-10-CM | POA: Diagnosis not present

## 2023-11-28 DIAGNOSIS — Z79630 Long term (current) use of alkylating agent: Secondary | ICD-10-CM | POA: Diagnosis not present

## 2023-11-28 DIAGNOSIS — R232 Flushing: Secondary | ICD-10-CM | POA: Diagnosis not present

## 2023-11-28 DIAGNOSIS — E039 Hypothyroidism, unspecified: Secondary | ICD-10-CM | POA: Diagnosis not present

## 2023-11-28 DIAGNOSIS — Z5112 Encounter for antineoplastic immunotherapy: Secondary | ICD-10-CM | POA: Diagnosis not present

## 2023-11-28 DIAGNOSIS — Z1722 Progesterone receptor negative status: Secondary | ICD-10-CM | POA: Diagnosis not present

## 2023-11-28 DIAGNOSIS — N6331 Unspecified lump in axillary tail of the right breast: Secondary | ICD-10-CM | POA: Diagnosis not present

## 2023-11-28 DIAGNOSIS — F32A Depression, unspecified: Secondary | ICD-10-CM | POA: Diagnosis not present

## 2023-11-28 DIAGNOSIS — T451X5A Adverse effect of antineoplastic and immunosuppressive drugs, initial encounter: Secondary | ICD-10-CM | POA: Diagnosis not present

## 2023-11-28 DIAGNOSIS — K219 Gastro-esophageal reflux disease without esophagitis: Secondary | ICD-10-CM | POA: Diagnosis not present

## 2023-11-28 DIAGNOSIS — D6481 Anemia due to antineoplastic chemotherapy: Secondary | ICD-10-CM | POA: Diagnosis not present

## 2023-11-28 DIAGNOSIS — Z171 Estrogen receptor negative status [ER-]: Secondary | ICD-10-CM

## 2023-11-28 DIAGNOSIS — R7401 Elevation of levels of liver transaminase levels: Secondary | ICD-10-CM | POA: Diagnosis not present

## 2023-11-28 DIAGNOSIS — R09A0 Foreign body sensation, unspecified: Secondary | ICD-10-CM | POA: Diagnosis not present

## 2023-11-28 DIAGNOSIS — D701 Agranulocytosis secondary to cancer chemotherapy: Secondary | ICD-10-CM | POA: Diagnosis not present

## 2023-11-28 DIAGNOSIS — M898X9 Other specified disorders of bone, unspecified site: Secondary | ICD-10-CM | POA: Diagnosis not present

## 2023-11-28 DIAGNOSIS — R5383 Other fatigue: Secondary | ICD-10-CM | POA: Diagnosis not present

## 2023-11-28 DIAGNOSIS — N951 Menopausal and female climacteric states: Secondary | ICD-10-CM | POA: Diagnosis not present

## 2023-11-28 MED ORDER — FILGRASTIM-SNDZ 480 MCG/0.8ML IJ SOSY
480.0000 ug | PREFILLED_SYRINGE | Freq: Once | INTRAMUSCULAR | Status: AC
  Administered 2023-11-28: 480 ug via SUBCUTANEOUS
  Filled 2023-11-28: qty 0.8

## 2023-11-28 NOTE — Patient Instructions (Signed)
 Filgrastim Injection What is this medication? FILGRASTIM (fil GRA stim) lowers the risk of infection in people who are receiving chemotherapy. It works by Systems analyst make more white blood cells, which protects your body from infection. It may also be used to help people who have been exposed to high doses of radiation. It can be used to help prepare your body before a stem cell transplant. It works by helping your bone marrow make and release stem cells into the blood. This medicine may be used for other purposes; ask your health care provider or pharmacist if you have questions. COMMON BRAND NAME(S): Neupogen, Nivestym, Nypozi, Releuko, Zarxio What should I tell my care team before I take this medication? They need to know if you have any of these conditions: History of blood diseases, such as sickle cell anemia Kidney disease Recent or ongoing radiation An unusual or allergic reaction to filgrastim, pegfilgrastim, latex, rubber, other medications, foods, dyes, or preservatives Pregnant or trying to get pregnant Breast-feeding How should I use this medication? This medication is injected under the skin or into a vein. It is usually given by your care team in a hospital or clinic setting. It may be given at home. If you get this medication at home, you will be taught how to prepare and give it. Use exactly as directed. Take it as directed on the prescription label at the same time every day. Keep taking it unless your care team tells you to stop. It is important that you put your used needles and syringes in a special sharps container. Do not put them in a trash can. If you do not have a sharps container, call your pharmacist or care team to get one. This medication comes with INSTRUCTIONS FOR USE. Ask your pharmacist for directions on how to use this medication. Read the information carefully. Talk to your pharmacist or care team if you have questions. Talk to your care team about the use of  this medication in children. While it may be prescribed for children for selected conditions, precautions do apply. Overdosage: If you think you have taken too much of this medicine contact a poison control center or emergency room at once. NOTE: This medicine is only for you. Do not share this medicine with others. What if I miss a dose? It is important not to miss any doses. Talk to your care team about what to do if you miss a dose. What may interact with this medication? Medications that may cause a release of neutrophils, such as lithium This list may not describe all possible interactions. Give your health care provider a list of all the medicines, herbs, non-prescription drugs, or dietary supplements you use. Also tell them if you smoke, drink alcohol, or use illegal drugs. Some items may interact with your medicine. What should I watch for while using this medication? Your condition will be monitored carefully while you are receiving this medication. You may need bloodwork while taking this medication. Talk to your care team about your risk of cancer. You may be more at risk for certain types of cancer if you take this medication. What side effects may I notice from receiving this medication? Side effects that you should report to your care team as soon as possible: Allergic reactions--skin rash, itching, hives, swelling of the face, lips, tongue, or throat Capillary leak syndrome--stomach or muscle pain, unusual weakness or fatigue, feeling faint or lightheaded, decrease in the amount of urine, swelling of the ankles, hands,  or feet, trouble breathing High white blood cell level--fever, fatigue, trouble breathing, night sweats, change in vision, weight loss Inflammation of the aorta--fever, fatigue, back, chest, or stomach pain, severe headache Kidney injury (glomerulonephritis)--decrease in the amount of urine, red or dark brown urine, foamy or bubbly urine, swelling of the ankles, hands,  or feet Shortness of breath or trouble breathing Spleen injury--pain in upper left stomach or shoulder Unusual bruising or bleeding Side effects that usually do not require medical attention (report to your care team if they continue or are bothersome): Back pain Bone pain Fatigue Fever Headache Nausea This list may not describe all possible side effects. Call your doctor for medical advice about side effects. You may report side effects to FDA at 1-800-FDA-1088. Where should I keep my medication? Keep out of the reach of children and pets. Keep this medication in the original packaging until you are ready to take it. Protect from light. See product for storage information. Each product may have different instructions. Get rid of any unused medication after the expiration date. To get rid of medications that are no longer needed or have expired: Take the medication to a medications take-back program. Check with your pharmacy or law enforcement to find a location. If you cannot return the medication, ask your pharmacist or care team how to get rid of this medication safely. NOTE: This sheet is a summary. It may not cover all possible information. If you have questions about this medicine, talk to your doctor, pharmacist, or health care provider.  2024 Elsevier/Gold Standard (2021-08-22 00:00:00)

## 2023-11-30 ENCOUNTER — Inpatient Hospital Stay

## 2023-11-30 ENCOUNTER — Other Ambulatory Visit: Payer: Self-pay

## 2023-11-30 ENCOUNTER — Telehealth: Payer: Self-pay

## 2023-11-30 VITALS — BP 139/78 | HR 92 | Temp 98.3°F | Resp 18

## 2023-11-30 DIAGNOSIS — R09A0 Foreign body sensation, unspecified: Secondary | ICD-10-CM | POA: Diagnosis not present

## 2023-11-30 DIAGNOSIS — D701 Agranulocytosis secondary to cancer chemotherapy: Secondary | ICD-10-CM | POA: Diagnosis not present

## 2023-11-30 DIAGNOSIS — Z17421 Hormone receptor negative with human epidermal growth factor receptor 2 negative status: Secondary | ICD-10-CM | POA: Diagnosis not present

## 2023-11-30 DIAGNOSIS — N951 Menopausal and female climacteric states: Secondary | ICD-10-CM | POA: Diagnosis not present

## 2023-11-30 DIAGNOSIS — C50411 Malignant neoplasm of upper-outer quadrant of right female breast: Secondary | ICD-10-CM | POA: Diagnosis not present

## 2023-11-30 DIAGNOSIS — N6331 Unspecified lump in axillary tail of the right breast: Secondary | ICD-10-CM | POA: Diagnosis not present

## 2023-11-30 DIAGNOSIS — D0512 Intraductal carcinoma in situ of left breast: Secondary | ICD-10-CM

## 2023-11-30 DIAGNOSIS — Z79630 Long term (current) use of alkylating agent: Secondary | ICD-10-CM | POA: Diagnosis not present

## 2023-11-30 DIAGNOSIS — D6481 Anemia due to antineoplastic chemotherapy: Secondary | ICD-10-CM | POA: Diagnosis not present

## 2023-11-30 DIAGNOSIS — K1230 Oral mucositis (ulcerative), unspecified: Secondary | ICD-10-CM | POA: Diagnosis not present

## 2023-11-30 DIAGNOSIS — T451X5A Adverse effect of antineoplastic and immunosuppressive drugs, initial encounter: Secondary | ICD-10-CM | POA: Diagnosis not present

## 2023-11-30 DIAGNOSIS — M898X9 Other specified disorders of bone, unspecified site: Secondary | ICD-10-CM | POA: Diagnosis not present

## 2023-11-30 DIAGNOSIS — R7401 Elevation of levels of liver transaminase levels: Secondary | ICD-10-CM | POA: Diagnosis not present

## 2023-11-30 DIAGNOSIS — R5383 Other fatigue: Secondary | ICD-10-CM | POA: Diagnosis not present

## 2023-11-30 DIAGNOSIS — Z1722 Progesterone receptor negative status: Secondary | ICD-10-CM | POA: Diagnosis not present

## 2023-11-30 DIAGNOSIS — E039 Hypothyroidism, unspecified: Secondary | ICD-10-CM | POA: Diagnosis not present

## 2023-11-30 DIAGNOSIS — R232 Flushing: Secondary | ICD-10-CM | POA: Diagnosis not present

## 2023-11-30 DIAGNOSIS — Z5112 Encounter for antineoplastic immunotherapy: Secondary | ICD-10-CM | POA: Diagnosis not present

## 2023-11-30 DIAGNOSIS — M47814 Spondylosis without myelopathy or radiculopathy, thoracic region: Secondary | ICD-10-CM | POA: Diagnosis not present

## 2023-11-30 DIAGNOSIS — K219 Gastro-esophageal reflux disease without esophagitis: Secondary | ICD-10-CM | POA: Diagnosis not present

## 2023-11-30 DIAGNOSIS — L309 Dermatitis, unspecified: Secondary | ICD-10-CM | POA: Diagnosis not present

## 2023-11-30 DIAGNOSIS — F32A Depression, unspecified: Secondary | ICD-10-CM | POA: Diagnosis not present

## 2023-11-30 DIAGNOSIS — Z171 Estrogen receptor negative status [ER-]: Secondary | ICD-10-CM | POA: Diagnosis not present

## 2023-11-30 DIAGNOSIS — Z5111 Encounter for antineoplastic chemotherapy: Secondary | ICD-10-CM | POA: Diagnosis not present

## 2023-11-30 LAB — CBC WITH DIFFERENTIAL (CANCER CENTER ONLY)
Abs Immature Granulocytes: 0.03 K/uL (ref 0.00–0.07)
Basophils Absolute: 0.1 K/uL (ref 0.0–0.1)
Basophils Relative: 1 %
Eosinophils Absolute: 0 K/uL (ref 0.0–0.5)
Eosinophils Relative: 0 %
HCT: 39.3 % (ref 36.0–46.0)
Hemoglobin: 13 g/dL (ref 12.0–15.0)
Immature Granulocytes: 0 %
Lymphocytes Relative: 11 %
Lymphs Abs: 0.9 K/uL (ref 0.7–4.0)
MCH: 31.6 pg (ref 26.0–34.0)
MCHC: 33.1 g/dL (ref 30.0–36.0)
MCV: 95.6 fL (ref 80.0–100.0)
Monocytes Absolute: 0.2 K/uL (ref 0.1–1.0)
Monocytes Relative: 3 %
Neutro Abs: 6.9 K/uL (ref 1.7–7.7)
Neutrophils Relative %: 85 %
Platelet Count: 163 K/uL (ref 150–400)
RBC: 4.11 MIL/uL (ref 3.87–5.11)
RDW: 13.6 % (ref 11.5–15.5)
Smear Review: NORMAL
WBC Count: 8.2 K/uL (ref 4.0–10.5)
nRBC: 0 % (ref 0.0–0.2)

## 2023-11-30 MED ORDER — FILGRASTIM-SNDZ 480 MCG/0.8ML IJ SOSY
480.0000 ug | PREFILLED_SYRINGE | Freq: Once | INTRAMUSCULAR | Status: DC
  Filled 2023-11-30: qty 0.8

## 2023-11-30 NOTE — Telephone Encounter (Signed)
 Pt called stating that she's gotten GCSF on 08/15, 08/16, and is scheduled to receive another shot on today 08/18.  Pt c/o of severe aches and pain in her joints throughout her body.  Pt wants to know if she could skip today's appt for last dose of GCSF.  Stated this nurse will check with Dr. Lanny and her Team and will f/u with the pt before her appt scheduled for today.  Pt verbalized understanding and stated she will await the return call.

## 2023-11-30 NOTE — Progress Notes (Signed)
 Patient here for labs and Zarxio  injection.  Zarxio  injection held due to lab results.  Ok to hold per secure chat from Lacie/NP and [employer]

## 2023-12-01 ENCOUNTER — Encounter: Payer: Self-pay | Admitting: Hematology

## 2023-12-02 NOTE — Assessment & Plan Note (Signed)
 IDC and DCIS, Stage IA, pT1b, cN0, triple negative, Grade 3, chest wall recurrence in 10/2023  -found on screening mammogram. S/p lumpectomy on 01/08/22 with Dr. Aron, path showed: 8 mm invasive and in situ ductal carcinoma with negative margins. Repeat prognostic panel confirmed triple negative disease.  -lymph node biopsies 01/28/22 showed negative nodes (0/2). Port placed at time of procedure. Postoperative course complicated by pneumothorax, which has resolved. -she began adjuvant TC on 02/20/22, and completed planned 4 cycles on 04/24/2022. -she completed adjuvant RT on 07/09/2022  -biopsy confirmed chest lymph nodes (axilla and chest wall including posteriad pectoralis muscle)  recurrence in 10/2023 - Case reviewed in breast tumor board, plan to start neoadjuvant chemotherapy with carboplatin  and paclitaxel  every 3 weeks for 4 cycles, followed by AC every 3 weeks for 4 cycles, along with immunotherapy Keytruda  for 1 year.  If she has good response to treatment, Dr. Aron will consider surgery although she is not able to remove the aubpectoral lymph node.  She may need adjuvant radiation after surgery.

## 2023-12-02 NOTE — Progress Notes (Unsigned)
 Patient Care Team: [name], MD as PCP - General (Internal Medicine) Glean Stephane BROCKS, RN (Inactive) as Oncology Nurse Navigator [name], RN as Oncology Nurse Navigator [name], MD as Consulting Physician (General Surgery) [name], MD as Consulting Physician (Hematology) [name], MD as Consulting Physician (Radiation Oncology) [name], MD as Consulting Physician (Endocrinology) [name], MD as Consulting Physician (Sports Medicine) [name], MD as Consulting Physician (Obstetrics and Gynecology) [name], [name], NP as Nurse Practitioner (Nurse Practitioner) Imaging, [facility] as Radiologist (Diagnostic Radiology)  Clinic Day:  12/03/2023  Referring physician: [name], MD  ASSESSMENT & PLAN:   Assessment & Plan: Malignant neoplasm of upper-outer quadrant of right breast in female, estrogen receptor negative (HCC) IDC and DCIS, Stage IA, pT1b, cN0, triple negative, Grade 3, chest wall recurrence in 10/2023  -found on screening mammogram. S/p lumpectomy on 01/08/22 with Dr. Aron, path showed: 8 mm invasive and in situ ductal carcinoma with negative margins. Repeat prognostic panel confirmed triple negative disease.  -lymph node biopsies 01/28/22 showed negative nodes (0/2). Port placed at time of procedure. Postoperative course complicated by pneumothorax, which has resolved. -she began adjuvant TC on 02/20/22, and completed planned 4 cycles on 04/24/2022. -she completed adjuvant RT on 07/09/2022  -biopsy confirmed chest lymph nodes (axilla and chest wall including posteriad pectoralis muscle)  recurrence in 10/2023 - Case reviewed in breast tumor board, plan to start neoadjuvant chemotherapy with weekly paclitaxel  and carboplatin  and keytruda  every 3 weeks for 4 cycles, followed by AC every 3 weeks for 4 cycles, along with immunotherapy Keytruda  for 1 year.  If she has good response to treatment, Dr. Aron will consider  surgery although she is not able to remove the aubpectoral lymph node.  She may need adjuvant radiation after surgery. - 12/03/2023 -patient presents for cycle 2 day 1 of neoadjuvant chemotherapy with carboplatin , paclitaxel , and Keytruda .  Tolerated first cycle very well with minimal and manageable side effects.  Continue with scheduled treatments.   Mucositis Patient has used Magic mouthwash in the past with good results.  New prescription needed sent to [facility].  Advised patient to swish and swallow with mouthwash 3-4 times daily.  Will refill as indicated.  Mild dermatitis Patient has mild, fine, red rash on the arms, legs, and back.  Slightly itchy.  Has been using Eucerin cream which helps a little but continues to itch.  Prescription for clobetasol  emollient cream sent to her pharmacy.  Advised her to use twice daily.  Consider mixing with Eucerin cream for added effect.  Should improve itching, irritation, and dryness.  Leukopenia WBC is 3.0 and ANC 1.6.  Reviewed infection prevention strategies such as good hand hygiene and avoiding sick contacts in large crowds.  If necessary, recommend she wear a mask to prevent exposure to infection.  Will monitor blood counts closely.  Consider addition of growth factor support if indicated.  Plan Patient seen in infusion suite.  Labs reviewed. -CBC showing mild leukocytosis. - CMP is unremarkable. Magic mouthwash and clobetasol  emollient cream sent to pharmacy. Patient labs and presentation are appropriate for treatment. Proceed with cycle 2 day 1 neoadjuvant chemotherapy with carboplatin , paclitaxel , Keytruda . Labs/flush, follow-ups, and subsequent treatments as scheduled.  The patient understands the plans discussed today and is in agreement with them.  She knows to contact our office if she develops concerns prior to her next appointment.  I provided 25 minutes of face-to-face time during this encounter and >  50% was spent  counseling as documented under my assessment and plan.    Powell FORBES Lessen, NP  [facility] - A DEPT OF [facility] [address] [place] [address] Dept: [phone] Dept Fax: [phone]   No orders of the defined types were placed in this encounter.     CHIEF COMPLAINT:  CC: right breast cancer, ER -  Current Treatment: Neoadjuvant chemotherapy with weekly paclitaxel , carboplatin  and Keytruda  every 3 weeks  INTERVAL HISTORY:  Samantha [name] is here today for repeat clinical assessment.  She last saw Dr. Lanny on 11/19/2023.  She presents for cycle 2 day 1 of neoadjuvant chemotherapy paclitaxel , carboplatin , and Keytruda .  She reports that she is tolerating treatment well overall.  She has noticed some mucositis, especially on her tongue.  Has used Magic mouthwash before.  Current prescription is expired.  She has also noticed a fine, red, raised rash on her arms, back, and legs.  Has been using Eucerin which helps the dryness, but does not relieve itching and irritation.  She has noted some fatigue.  This is manageable.  She denies chest pain, chest pressure, or shortness of breath. She denies headaches or visual disturbances. She denies abdominal pain, nausea, vomiting, or changes in bowel or bladder habits.  She denies fevers or chills. She denies pain. Her appetite is fair. Her weight has been stable.  I have reviewed the past medical history, past surgical history, social history and family history with the patient and they are unchanged from previous note.  ALLERGIES:  is allergic to nortriptyline , singulair  [montelukast  sodium], sulfonamide derivatives, amitriptyline  hcl, esomeprazole, fluticasone , gabapentin , lexapro [escitalopram], neomycin, pseudoephedrine, and neosporin [bacitracin-polymyxin b].  MEDICATIONS:  Current Outpatient Medications  Medication Sig Dispense Refill   Clobetasol  Prop Emollient Base 0.05 %  emollient cream Apply 1 Application topically 2 (two) times daily. 60 g 1   acetaminophen  (TYLENOL ) 325 MG tablet Take 650 mg by mouth every 6 (six) hours as needed.     ALPRAZolam (XANAX) 0.5 MG tablet Take 0.5 mg by mouth at bedtime as needed for anxiety.     atorvastatin (LIPITOR) 20 MG tablet Take 20 mg by mouth every evening.     betamethasone dipropionate 0.05 % cream Apply 1 Application topically 2 (two) times daily as needed (skin irritation.).     BIOTIN PO Take 1 tablet by mouth 2 (two) times a week. With lunch (Patient not taking: Reported on 11/26/2023)     Calcium  Carb-Cholecalciferol (CALCIUM -VITAMIN D3) 250-125 MG-UNIT TABS Take 1 tablet by mouth daily with lunch.     Cholecalciferol (VITAMIN D3) 50 MCG (2000 UT) TABS Take 2,000 Units by mouth daily with lunch.     ezetimibe (ZETIA) 10 MG tablet Take 10 mg by mouth every evening.     famotidine  (PEPCID ) 20 MG tablet Take 1 tablet (20 mg total) by mouth daily. (Patient taking differently: Take 20 mg by mouth daily as needed for heartburn or indigestion.)     Fezolinetant (VEOZAH) 45 MG TABS Take 45 mg by mouth in the morning. (Patient not taking: Reported on 11/26/2023)     lidocaine -prilocaine  (EMLA ) cream Apply to affected area once 30 g 3   loperamide (IMODIUM) 2 MG capsule Take by mouth as needed for diarrhea or loose stools.     magic mouthwash (nystatin , diphenhydrAMINE , alum & mag hydroxide) suspension mixture Swish and spit 5 mLs 4 (four) times daily as needed for mouth  pain. 140 mL 1   nystatin -triamcinolone (MYCOLOG II) cream Apply 1 Application topically 2 (two) times daily as needed (irritation).     ondansetron  (ZOFRAN ) 8 MG tablet Take 1 tablet (8 mg total) by mouth every 8 (eight) hours as needed for nausea or vomiting. Start on the third day after chemotherapy. 30 tablet 1   prochlorperazine  (COMPAZINE ) 10 MG tablet Take 1 tablet (10 mg total) by mouth every 6 (six) hours as needed for nausea or vomiting. 30 tablet 1    sertraline  (ZOLOFT ) 50 MG tablet Take 50 mg by mouth in the morning.     tacrolimus (PROTOPIC) 0.03 % ointment Apply 1 Application topically 2 (two) times daily as needed (skin irritation.).     TIROSINT  100 MCG CAPS Take 100 mcg by mouth See admin instructions. Take 1 capsule (100 mcg) by mouth every 3 days, alternating with (Tirosint  88 mcg)--take on an empty stomach.     TIROSINT  88 MCG CAPS Take 88 mcg by mouth See admin instructions. Take 1 capsule (88 mcg) by mouth 2 days in a row, alternating with Tirosint  (100 mcg) on the 3 rd day.     triamcinolone ointment (KENALOG) 0.1 % Apply 1 Application topically 2 (two) times daily as needed (skin irritation.).     valACYclovir (VALTREX) 500 MG tablet Take 500 mg by mouth 2 (two) times daily as needed (cold sore/fever blisters).     No current facility-administered medications for this visit.   Facility-Administered Medications Ordered in Other Visits  Medication Dose Route Frequency Provider Last Rate Last Admin   0.9 %  sodium chloride  infusion   Intravenous Continuous [name], MD   Stopped at 12/03/23 1551    HISTORY OF PRESENT ILLNESS:   Oncology History Overview Note   Cancer Staging  Malignant neoplasm of upper-outer quadrant of right breast in female, estrogen receptor negative Staging form: Breast, AJCC 8th Edition - Clinical stage from 12/23/2021: cT1b, cM0, G3, ER-, PR-, HER2- - Unsigned Stage prefix: Initial diagnosis Histologic grading system: 3 grade system - Pathologic stage from 01/08/2022: Stage IB (pT1b, pN0, cM0, G3, ER-, PR-, HER2-) - Signed by [name], MD on 01/24/2022 Stage prefix: Initial diagnosis Histologic grading system: 3 grade system Residual tumor (R): R0 - None     Malignant neoplasm of upper-outer quadrant of right breast in female, estrogen receptor negative (HCC)  12/06/2021 Mammogram   CLINICAL DATA:  Patient returns today to evaluate RIGHT breast calcifications identified on a recent screening  mammogram.   EXAM: DIGITAL DIAGNOSTIC UNILATERAL RIGHT MAMMOGRAM  IMPRESSION: Grouped coarse heterogeneous calcifications within the upper-outer quadrant of the RIGHT breast, measuring 6 mm extent. These may be fibroadenomatous calcifications. Stereotactic biopsy is recommended to exclude malignancy.   12/18/2021 Initial Biopsy   Diagnosis Breast, right, needle core biopsy, upper outer quadrant, x clip - DUCTAL CARCINOMA IN SITU, SOLID AND CRIBRIFORM TYPE WITH COMEDONECROSIS AND ASSOCIATED CALCIFICATIONS, NUCLEAR GRADE 3 OF 3 - FOCAL MICROINVASION IS PRESENT (LESS THAN 1 MM) WITH EVIDENCE OF LYMPHOVASCULAR INVASION - NECROSIS: PRESENT - CALCIFICATIONS: PRESENT - DCIS LENGTH: 7 MM IN GREATEST LINEAR DIMENSION ON FRAGMENTED CORES  PROGNOSTIC INDICATORS Results: IMMUNOHISTOCHEMICAL AND MORPHOMETRIC ANALYSIS PERFORMED MANUALLY The tumor cells are NEGATIVE for Her2 (1+). Estrogen Receptor: 0%, NEGATIVE Progesterone Receptor: 0%, NEGATIVE Proliferation Marker Ki67: 60%   12/23/2021 Initial Diagnosis   Malignant neoplasm of upper-outer quadrant of right breast in female, estrogen receptor negative (HCC)   12/23/2021 Imaging   EXAM: ULTRASOUND OF THE RIGHT AXILLA  IMPRESSION: No abnormal appearing RIGHT axillary lymph nodes.   01/08/2022 Cancer Staging   Staging form: Breast, AJCC 8th Edition - Pathologic stage from 01/08/2022: Stage IB (pT1b, pN0, cM0, G3, ER-, PR-, HER2-) - Signed by [name], MD on 01/24/2022 Stage prefix: Initial diagnosis Histologic grading system: 3 grade system Residual tumor (R): R0 - None   02/20/2022 - 04/24/2022 Chemotherapy   Patient is on Treatment Plan : BREAST TC q21d     10/06/2022 Survivorship   SCP delivered by Lacie [name], NP   11/13/2023 -  Chemotherapy   Patient is on Treatment Plan : BREAST Pembrolizumab  (200) D1 + Carboplatin  (5) D1 + Paclitaxel  (80) D1,8,15 q21d X 4 cycles / Pembrolizumab  (200) D1 + AC D1 q21d x 4 cycles     Ductal carcinoma  in situ (DCIS) of left breast  12/09/2022 Initial Diagnosis   Ductal carcinoma in situ (DCIS) of left breast   01/01/2023 Surgery   Let breast seed localized lumpectomy.with Dr. Aron  2.5 cm DCIS with negative margins.     02/11/2023 - 03/10/2023 Radiation Therapy   Dose per fraction - 2.66 Gy Prescribed dose (delivered/prescribed)  42.56/42.56 Prescribed Fxs (delivered/prescribed)  16/16  Boost  Dose per fraction -  2Gy Prescribed dose (delivered/prescribed) 8Gy/8Gy) Prescribed Fxs (delivered/prescribed) (4Gy/4Gy)         REVIEW OF SYSTEMS:   Constitutional: Denies fevers, chills or abnormal weight loss. Mild fatigue Eyes: Denies blurriness of vision Ears, nose, mouth, throat, and face: Denies mucositis or sore throat Respiratory: Denies cough, dyspnea or wheezes Cardiovascular: Denies palpitation, chest discomfort or lower extremity swelling Gastrointestinal:  Denies nausea, heartburn or change in bowel habits Skin: has noted fine, raised, itchy rash on arms, upper back, and legs.  Lymphatics: Denies new lymphadenopathy or easy bruising Neurological:Denies numbness, tingling or new weaknesses Behavioral/Psych: Mood is stable, no new changes  All other systems were reviewed with the patient and are negative.   VITALS:   Today's Vitals   12/03/23 1703  BP: 129/81  Pulse: 86  Resp: 16  Temp: 98.2 F (36.8 C)  SpO2: 98%  Weight: 166 lb (75.3 kg)   Body mass index is 26 kg/m.   Wt Readings from Last 3 Encounters:  12/03/23 166 lb (75.3 kg)  12/03/23 166 lb (75.3 kg)  11/26/23 167 lb 4 oz (75.9 kg)    Body mass index is 26 kg/m.  Performance status (ECOG): 1 - Symptomatic but completely ambulatory  PHYSICAL EXAM:   GENERAL:alert, no distress and comfortable SKIN: skin color, texture, turgor are normal. She has fine rash present on her arms, legs, and upper back. No breaks in skin are noted.  EYES: normal, Conjunctiva are pink and non-injected, sclera  clear OROPHARYNX:no exudate, no erythema and lips, buccal mucosa, and tongue normal  NECK: supple, thyroid  normal size, non-tender, without nodularity LYMPH:  no palpable lymphadenopathy in the cervical, axillary or inguinal LUNGS: clear to auscultation and percussion with normal breathing effort HEART: regular rate & rhythm and no murmurs and no lower extremity edema ABDOMEN:abdomen soft, non-tender and normal bowel sounds Musculoskeletal:no cyanosis of digits and no clubbing  NEURO: alert & oriented x 3 with fluent speech, no focal motor/sensory deficits  LABORATORY DATA:  I have reviewed the data as listed    Component Value Date/Time   NA 139 12/03/2023 1002   K 3.9 12/03/2023 1002   CL 104 12/03/2023 1002   CO2 30 12/03/2023 1002   GLUCOSE 110 (H) 12/03/2023 1002  BUN 12 12/03/2023 1002   CREATININE 0.55 12/03/2023 1002   CALCIUM  9.2 12/03/2023 1002   PROT 7.1 12/03/2023 1002   ALBUMIN 4.5 12/03/2023 1002   AST 23 12/03/2023 1002   ALT 36 12/03/2023 1002   ALKPHOS 112 12/03/2023 1002   BILITOT 0.4 12/03/2023 1002   GFRNONAA >60 12/03/2023 1002   GFRAA >90 03/09/2013 2327   CBC    Component Value Date/Time   WBC 3.0 (L) 12/03/2023 1002   WBC 8.6 11/03/2023 1211   RBC 3.93 12/03/2023 1002   HGB 12.5 12/03/2023 1002   HCT 37.2 12/03/2023 1002   PLT 137 (L) 12/03/2023 1002   MCV 94.7 12/03/2023 1002   MCH 31.8 12/03/2023 1002   MCHC 33.6 12/03/2023 1002   RDW 13.5 12/03/2023 1002   LYMPHSABS 0.8 12/03/2023 1002   MONOABS 0.5 12/03/2023 1002   EOSABS 0.0 12/03/2023 1002   BASOSABS 0.0 12/03/2023 1002      RADIOGRAPHIC STUDIES: DG CHEST PORT 1 VIEW Result Date: 11/04/2023 CLINICAL DATA:  Left Port-A-Cath placement EXAM: PORTABLE CHEST 1 VIEW COMPARISON:  PET-CT 10/15/2023 FINDINGS: The power injectable left internal jugular Port-A-Cath is noted extending up into the left neck back and down into the chest to terminate in the SVC. A small portion of the catheter in  the left neck is excluded on today's chest image. If there is clinical concern regarding port function or if otherwise clinically warranted, a dedicated frontal projection of the neck could be utilized to ensure that there is no kinking in the nonvisualized portion of the port. Clips project over both breasts and the right axilla. The lungs appear clear. Cardiac and mediastinal contours normal. No blunting of the costophrenic angles. Lower thoracic spondylosis. No pneumothorax. IMPRESSION: 1. The left internal jugular Port-A-Cath extends up into the left neck back and down into the chest to terminate in the SVC. A small portion of the catheter in the left neck is excluded on today's chest image. If there is clinical concern regarding port function or if otherwise clinically warranted, a dedicated frontal projection of the neck could be utilized to ensure that there is no kinking in the nonvisualized portion of the port. 2. Lower thoracic spondylosis. Electronically Signed   By: Ryan Salvage M.D.   On: 11/04/2023 09:13   DG C-Arm 1-60 Min-No Report Result Date: 11/04/2023 Fluoroscopy was utilized by the requesting physician.  No radiographic interpretation.

## 2023-12-03 ENCOUNTER — Encounter: Payer: Self-pay | Admitting: Nurse Practitioner

## 2023-12-03 ENCOUNTER — Inpatient Hospital Stay

## 2023-12-03 ENCOUNTER — Other Ambulatory Visit: Payer: Self-pay

## 2023-12-03 ENCOUNTER — Inpatient Hospital Stay (HOSPITAL_BASED_OUTPATIENT_CLINIC_OR_DEPARTMENT_OTHER): Admitting: Nurse Practitioner

## 2023-12-03 ENCOUNTER — Other Ambulatory Visit (HOSPITAL_COMMUNITY): Payer: Self-pay

## 2023-12-03 ENCOUNTER — Encounter: Payer: Self-pay | Admitting: Hematology

## 2023-12-03 VITALS — BP 129/81 | HR 86 | Temp 98.2°F | Resp 16 | Wt 166.0 lb

## 2023-12-03 DIAGNOSIS — Z5111 Encounter for antineoplastic chemotherapy: Secondary | ICD-10-CM | POA: Diagnosis not present

## 2023-12-03 DIAGNOSIS — N6331 Unspecified lump in axillary tail of the right breast: Secondary | ICD-10-CM | POA: Diagnosis not present

## 2023-12-03 DIAGNOSIS — Z5112 Encounter for antineoplastic immunotherapy: Secondary | ICD-10-CM | POA: Diagnosis not present

## 2023-12-03 DIAGNOSIS — Z171 Estrogen receptor negative status [ER-]: Secondary | ICD-10-CM | POA: Diagnosis not present

## 2023-12-03 DIAGNOSIS — K1230 Oral mucositis (ulcerative), unspecified: Secondary | ICD-10-CM | POA: Diagnosis not present

## 2023-12-03 DIAGNOSIS — D701 Agranulocytosis secondary to cancer chemotherapy: Secondary | ICD-10-CM | POA: Diagnosis not present

## 2023-12-03 DIAGNOSIS — D6481 Anemia due to antineoplastic chemotherapy: Secondary | ICD-10-CM | POA: Diagnosis not present

## 2023-12-03 DIAGNOSIS — R232 Flushing: Secondary | ICD-10-CM | POA: Diagnosis not present

## 2023-12-03 DIAGNOSIS — D0512 Intraductal carcinoma in situ of left breast: Secondary | ICD-10-CM | POA: Diagnosis not present

## 2023-12-03 DIAGNOSIS — M47814 Spondylosis without myelopathy or radiculopathy, thoracic region: Secondary | ICD-10-CM | POA: Diagnosis not present

## 2023-12-03 DIAGNOSIS — R5383 Other fatigue: Secondary | ICD-10-CM | POA: Diagnosis not present

## 2023-12-03 DIAGNOSIS — C50411 Malignant neoplasm of upper-outer quadrant of right female breast: Secondary | ICD-10-CM | POA: Diagnosis not present

## 2023-12-03 DIAGNOSIS — R7401 Elevation of levels of liver transaminase levels: Secondary | ICD-10-CM | POA: Diagnosis not present

## 2023-12-03 DIAGNOSIS — Z17421 Hormone receptor negative with human epidermal growth factor receptor 2 negative status: Secondary | ICD-10-CM | POA: Diagnosis not present

## 2023-12-03 DIAGNOSIS — K219 Gastro-esophageal reflux disease without esophagitis: Secondary | ICD-10-CM | POA: Diagnosis not present

## 2023-12-03 DIAGNOSIS — T451X5A Adverse effect of antineoplastic and immunosuppressive drugs, initial encounter: Secondary | ICD-10-CM | POA: Diagnosis not present

## 2023-12-03 DIAGNOSIS — M898X9 Other specified disorders of bone, unspecified site: Secondary | ICD-10-CM | POA: Diagnosis not present

## 2023-12-03 DIAGNOSIS — N951 Menopausal and female climacteric states: Secondary | ICD-10-CM | POA: Diagnosis not present

## 2023-12-03 DIAGNOSIS — R09A0 Foreign body sensation, unspecified: Secondary | ICD-10-CM | POA: Diagnosis not present

## 2023-12-03 DIAGNOSIS — E039 Hypothyroidism, unspecified: Secondary | ICD-10-CM | POA: Diagnosis not present

## 2023-12-03 DIAGNOSIS — Z1722 Progesterone receptor negative status: Secondary | ICD-10-CM | POA: Diagnosis not present

## 2023-12-03 DIAGNOSIS — F32A Depression, unspecified: Secondary | ICD-10-CM | POA: Diagnosis not present

## 2023-12-03 DIAGNOSIS — L309 Dermatitis, unspecified: Secondary | ICD-10-CM | POA: Diagnosis not present

## 2023-12-03 DIAGNOSIS — Z79630 Long term (current) use of alkylating agent: Secondary | ICD-10-CM | POA: Diagnosis not present

## 2023-12-03 LAB — CBC WITH DIFFERENTIAL (CANCER CENTER ONLY)
Abs Immature Granulocytes: 0.07 K/uL (ref 0.00–0.07)
Basophils Absolute: 0 K/uL (ref 0.0–0.1)
Basophils Relative: 1 %
Eosinophils Absolute: 0 K/uL (ref 0.0–0.5)
Eosinophils Relative: 0 %
HCT: 37.2 % (ref 36.0–46.0)
Hemoglobin: 12.5 g/dL (ref 12.0–15.0)
Immature Granulocytes: 2 %
Lymphocytes Relative: 26 %
Lymphs Abs: 0.8 K/uL (ref 0.7–4.0)
MCH: 31.8 pg (ref 26.0–34.0)
MCHC: 33.6 g/dL (ref 30.0–36.0)
MCV: 94.7 fL (ref 80.0–100.0)
Monocytes Absolute: 0.5 K/uL (ref 0.1–1.0)
Monocytes Relative: 15 %
Neutro Abs: 1.6 K/uL — ABNORMAL LOW (ref 1.7–7.7)
Neutrophils Relative %: 56 %
Platelet Count: 137 K/uL — ABNORMAL LOW (ref 150–400)
RBC: 3.93 MIL/uL (ref 3.87–5.11)
RDW: 13.5 % (ref 11.5–15.5)
WBC Count: 3 K/uL — ABNORMAL LOW (ref 4.0–10.5)
nRBC: 0 % (ref 0.0–0.2)

## 2023-12-03 LAB — CMP (CANCER CENTER ONLY)
ALT: 36 U/L (ref 0–44)
AST: 23 U/L (ref 15–41)
Albumin: 4.5 g/dL (ref 3.5–5.0)
Alkaline Phosphatase: 112 U/L (ref 38–126)
Anion gap: 5 (ref 5–15)
BUN: 12 mg/dL (ref 8–23)
CO2: 30 mmol/L (ref 22–32)
Calcium: 9.2 mg/dL (ref 8.9–10.3)
Chloride: 104 mmol/L (ref 98–111)
Creatinine: 0.55 mg/dL (ref 0.44–1.00)
GFR, Estimated: >60 mL/min (ref >60)
Glucose, Bld: 110 mg/dL — ABNORMAL HIGH (ref 70–99)
Potassium: 3.9 mmol/L (ref 3.5–5.1)
Sodium: 139 mmol/L (ref 135–145)
Total Bilirubin: 0.4 mg/dL (ref 0.0–1.2)
Total Protein: 7.1 g/dL (ref 6.5–8.1)

## 2023-12-03 MED ORDER — CETIRIZINE HCL 10 MG PO TABS
10.0000 mg | ORAL_TABLET | Freq: Once | ORAL | Status: AC
  Administered 2023-12-03: 10 mg via ORAL
  Filled 2023-12-03: qty 1

## 2023-12-03 MED ORDER — PALONOSETRON HCL INJECTION 0.25 MG/5ML
0.2500 mg | Freq: Once | INTRAVENOUS | Status: AC
  Administered 2023-12-03: 0.25 mg via INTRAVENOUS
  Filled 2023-12-03: qty 5

## 2023-12-03 MED ORDER — NYSTATIN 100000 UNIT/ML MT SUSP
5.0000 mL | Freq: Four times a day (QID) | OROMUCOSAL | 1 refills | Status: DC | PRN
  Filled 2023-12-03: qty 140, 7d supply, fill #0
  Filled 2023-12-16: qty 140, 7d supply, fill #1

## 2023-12-03 MED ORDER — SODIUM CHLORIDE 0.9 % IV SOLN
200.0000 mg | Freq: Once | INTRAVENOUS | Status: AC
  Administered 2023-12-03: 200 mg via INTRAVENOUS
  Filled 2023-12-03: qty 200

## 2023-12-03 MED ORDER — CLOBETASOL PROP EMOLLIENT BASE 0.05 % EX CREA
1.0000 | TOPICAL_CREAM | Freq: Two times a day (BID) | CUTANEOUS | 1 refills | Status: AC
  Filled 2023-12-03: qty 30, 15d supply, fill #0
  Filled 2023-12-29: qty 30, 15d supply, fill #1

## 2023-12-03 MED ORDER — FAMOTIDINE IN NACL 20-0.9 MG/50ML-% IV SOLN
20.0000 mg | Freq: Once | INTRAVENOUS | Status: AC
  Administered 2023-12-03: 20 mg via INTRAVENOUS
  Filled 2023-12-03: qty 50

## 2023-12-03 MED ORDER — SODIUM CHLORIDE 0.9 % IV SOLN
571.5000 mg | Freq: Once | INTRAVENOUS | Status: AC
  Administered 2023-12-03: 570 mg via INTRAVENOUS
  Filled 2023-12-03: qty 57

## 2023-12-03 MED ORDER — SODIUM CHLORIDE 0.9 % IV SOLN
80.0000 mg/m2 | Freq: Once | INTRAVENOUS | Status: AC
  Administered 2023-12-03: 156 mg via INTRAVENOUS
  Filled 2023-12-03: qty 26

## 2023-12-03 MED ORDER — SODIUM CHLORIDE 0.9 % IV SOLN: INTRAVENOUS | Status: DC

## 2023-12-03 MED ORDER — APREPITANT 130 MG/18ML IV EMUL
130.0000 mg | Freq: Once | INTRAVENOUS | Status: AC
  Administered 2023-12-03: 130 mg via INTRAVENOUS
  Filled 2023-12-03: qty 18

## 2023-12-03 NOTE — Progress Notes (Signed)
 VERBAL ORDER WITH READBACK FROM HEATHER BOSCIA, NP. FOR MAGIC MOUTHWASH WITH 1 REFILL T BE SENT TO [facility]. ORDER PLACED. PATIENT AWARE OF PRESCRIPTION.

## 2023-12-03 NOTE — Patient Instructions (Signed)
 [facility] - A DEPT OF [facility]  Discharge Instructions: Thank you for choosing [facility] to provide your oncology and hematology care.   If you have a lab appointment with the Cancer Center, please go directly to the Cancer Center and check in at the registration area.   Wear comfortable clothing and clothing appropriate for easy access to any Portacath or PICC line.   We strive to give you quality time with your provider. You may need to reschedule your appointment if you arrive late (15 or more minutes).  Arriving late affects you and other patients whose appointments are after yours.  Also, if you miss three or more appointments without notifying the office, you may be dismissed from the clinic at the provider's discretion.      For prescription refill requests, have your pharmacy contact our office and allow 72 hours for refills to be completed.    Today you received the following chemotherapy and/or immunotherapy agents: Paclitaxel , Carboplatin , Keytruda       To help prevent nausea and vomiting after your treatment, we encourage you to take your nausea medication as directed.  BELOW ARE SYMPTOMS THAT SHOULD BE REPORTED IMMEDIATELY: *FEVER GREATER THAN 100.4 F (38 C) OR HIGHER *CHILLS OR SWEATING *NAUSEA AND VOMITING THAT IS NOT CONTROLLED WITH YOUR NAUSEA MEDICATION *UNUSUAL SHORTNESS OF BREATH *UNUSUAL BRUISING OR BLEEDING *URINARY PROBLEMS (pain or burning when urinating, or frequent urination) *BOWEL PROBLEMS (unusual diarrhea, constipation, pain near the anus) TENDERNESS IN MOUTH AND THROAT WITH OR WITHOUT PRESENCE OF ULCERS (sore throat, sores in mouth, or a toothache) UNUSUAL RASH, SWELLING OR PAIN  UNUSUAL VAGINAL DISCHARGE OR ITCHING   Items with * indicate a potential emergency and should be followed up as soon as possible or go to the Emergency Department if any problems should occur.  Please show the CHEMOTHERAPY  ALERT CARD or IMMUNOTHERAPY ALERT CARD at check-in to the Emergency Department and triage nurse.  Should you have questions after your visit or need to cancel or reschedule your appointment, please contact [facility]  Dept: [phone]  and follow the prompts.  Office hours are 8:00 a.m. to 4:30 p.m. Monday - Friday. Please note that voicemails left after 4:00 p.m. may not be returned until the following business day.  We are closed weekends and major holidays. You have access to a nurse at all times for urgent questions. Please call the main number to the clinic Dept: [phone] and follow the prompts.   For any non-urgent questions, you may also contact your provider using MyChart. We now offer e-Visits for anyone 65 and older to request care online for non-urgent symptoms. For details visit [email or web address].   Also download the MyChart app! Go to the app store, search MyChart, open the app, select [place], and log in with your MyChart username and password.

## 2023-12-04 ENCOUNTER — Other Ambulatory Visit (HOSPITAL_COMMUNITY): Payer: Self-pay

## 2023-12-04 ENCOUNTER — Encounter

## 2023-12-04 DIAGNOSIS — E039 Hypothyroidism, unspecified: Secondary | ICD-10-CM | POA: Diagnosis not present

## 2023-12-07 ENCOUNTER — Ambulatory Visit: Admitting: Dietician

## 2023-12-08 ENCOUNTER — Telehealth: Payer: Self-pay

## 2023-12-08 DIAGNOSIS — H2513 Age-related nuclear cataract, bilateral: Secondary | ICD-10-CM | POA: Diagnosis not present

## 2023-12-08 DIAGNOSIS — H53143 Visual discomfort, bilateral: Secondary | ICD-10-CM | POA: Diagnosis not present

## 2023-12-08 DIAGNOSIS — R519 Headache, unspecified: Secondary | ICD-10-CM | POA: Diagnosis not present

## 2023-12-08 NOTE — Telephone Encounter (Signed)
 Pt called stating that she no longer wants to do the Dignicap d/t her hair still came out after using it w/her chemo tx.  Pt wanted to make Dr. Lanny aware so she can have her chair time reduced.  Stated this nurse notify Dr. Demetra scheduler and Dr Demetra Team.

## 2023-12-09 ENCOUNTER — Other Ambulatory Visit: Payer: Self-pay | Admitting: Nurse Practitioner

## 2023-12-09 DIAGNOSIS — Z171 Estrogen receptor negative status [ER-]: Secondary | ICD-10-CM

## 2023-12-10 ENCOUNTER — Inpatient Hospital Stay: Admitting: Hematology

## 2023-12-10 ENCOUNTER — Encounter: Payer: Self-pay | Admitting: Hematology

## 2023-12-10 ENCOUNTER — Inpatient Hospital Stay

## 2023-12-10 ENCOUNTER — Encounter: Payer: Self-pay | Admitting: *Deleted

## 2023-12-10 VITALS — BP 123/73 | HR 99 | Temp 97.7°F | Resp 16 | Wt 168.0 lb

## 2023-12-10 DIAGNOSIS — D0512 Intraductal carcinoma in situ of left breast: Secondary | ICD-10-CM | POA: Diagnosis not present

## 2023-12-10 DIAGNOSIS — Z79630 Long term (current) use of alkylating agent: Secondary | ICD-10-CM | POA: Diagnosis not present

## 2023-12-10 DIAGNOSIS — R09A0 Foreign body sensation, unspecified: Secondary | ICD-10-CM | POA: Diagnosis not present

## 2023-12-10 DIAGNOSIS — C50411 Malignant neoplasm of upper-outer quadrant of right female breast: Secondary | ICD-10-CM

## 2023-12-10 DIAGNOSIS — Z86000 Personal history of in-situ neoplasm of breast: Secondary | ICD-10-CM | POA: Diagnosis not present

## 2023-12-10 DIAGNOSIS — T451X5A Adverse effect of antineoplastic and immunosuppressive drugs, initial encounter: Secondary | ICD-10-CM | POA: Diagnosis not present

## 2023-12-10 DIAGNOSIS — R7401 Elevation of levels of liver transaminase levels: Secondary | ICD-10-CM | POA: Diagnosis not present

## 2023-12-10 DIAGNOSIS — R5383 Other fatigue: Secondary | ICD-10-CM | POA: Diagnosis not present

## 2023-12-10 DIAGNOSIS — Z5112 Encounter for antineoplastic immunotherapy: Secondary | ICD-10-CM | POA: Diagnosis not present

## 2023-12-10 DIAGNOSIS — K1230 Oral mucositis (ulcerative), unspecified: Secondary | ICD-10-CM | POA: Diagnosis not present

## 2023-12-10 DIAGNOSIS — K219 Gastro-esophageal reflux disease without esophagitis: Secondary | ICD-10-CM | POA: Diagnosis not present

## 2023-12-10 DIAGNOSIS — M898X9 Other specified disorders of bone, unspecified site: Secondary | ICD-10-CM | POA: Diagnosis not present

## 2023-12-10 DIAGNOSIS — N6331 Unspecified lump in axillary tail of the right breast: Secondary | ICD-10-CM | POA: Diagnosis not present

## 2023-12-10 DIAGNOSIS — F32A Depression, unspecified: Secondary | ICD-10-CM | POA: Diagnosis not present

## 2023-12-10 DIAGNOSIS — R232 Flushing: Secondary | ICD-10-CM | POA: Diagnosis not present

## 2023-12-10 DIAGNOSIS — Z171 Estrogen receptor negative status [ER-]: Secondary | ICD-10-CM | POA: Diagnosis not present

## 2023-12-10 DIAGNOSIS — M47814 Spondylosis without myelopathy or radiculopathy, thoracic region: Secondary | ICD-10-CM | POA: Diagnosis not present

## 2023-12-10 DIAGNOSIS — E039 Hypothyroidism, unspecified: Secondary | ICD-10-CM | POA: Diagnosis not present

## 2023-12-10 DIAGNOSIS — D701 Agranulocytosis secondary to cancer chemotherapy: Secondary | ICD-10-CM | POA: Diagnosis not present

## 2023-12-10 DIAGNOSIS — D6481 Anemia due to antineoplastic chemotherapy: Secondary | ICD-10-CM | POA: Diagnosis not present

## 2023-12-10 DIAGNOSIS — Z5111 Encounter for antineoplastic chemotherapy: Secondary | ICD-10-CM | POA: Diagnosis not present

## 2023-12-10 DIAGNOSIS — L309 Dermatitis, unspecified: Secondary | ICD-10-CM | POA: Diagnosis not present

## 2023-12-10 DIAGNOSIS — Z17421 Hormone receptor negative with human epidermal growth factor receptor 2 negative status: Secondary | ICD-10-CM | POA: Diagnosis not present

## 2023-12-10 DIAGNOSIS — Z1722 Progesterone receptor negative status: Secondary | ICD-10-CM | POA: Diagnosis not present

## 2023-12-10 DIAGNOSIS — N951 Menopausal and female climacteric states: Secondary | ICD-10-CM | POA: Diagnosis not present

## 2023-12-10 LAB — CMP (CANCER CENTER ONLY)
ALT: 62 U/L — ABNORMAL HIGH (ref 0–44)
AST: 36 U/L (ref 15–41)
Albumin: 4.5 g/dL (ref 3.5–5.0)
Alkaline Phosphatase: 105 U/L (ref 38–126)
Anion gap: 5 (ref 5–15)
BUN: 12 mg/dL (ref 8–23)
CO2: 30 mmol/L (ref 22–32)
Calcium: 9.5 mg/dL (ref 8.9–10.3)
Chloride: 104 mmol/L (ref 98–111)
Creatinine: 0.53 mg/dL (ref 0.44–1.00)
GFR, Estimated: >60 mL/min (ref >60)
Glucose, Bld: 129 mg/dL — ABNORMAL HIGH (ref 70–99)
Potassium: 4.1 mmol/L (ref 3.5–5.1)
Sodium: 139 mmol/L (ref 135–145)
Total Bilirubin: 0.5 mg/dL (ref 0.0–1.2)
Total Protein: 7.1 g/dL (ref 6.5–8.1)

## 2023-12-10 LAB — CBC WITH DIFFERENTIAL (CANCER CENTER ONLY)
Abs Immature Granulocytes: 0.01 K/uL (ref 0.00–0.07)
Basophils Absolute: 0 K/uL (ref 0.0–0.1)
Basophils Relative: 1 %
Eosinophils Absolute: 0 K/uL (ref 0.0–0.5)
Eosinophils Relative: 0 %
HCT: 35 % — ABNORMAL LOW (ref 36.0–46.0)
Hemoglobin: 11.8 g/dL — ABNORMAL LOW (ref 12.0–15.0)
Immature Granulocytes: 0 %
Lymphocytes Relative: 22 %
Lymphs Abs: 0.5 K/uL — ABNORMAL LOW (ref 0.7–4.0)
MCH: 31.6 pg (ref 26.0–34.0)
MCHC: 33.7 g/dL (ref 30.0–36.0)
MCV: 93.8 fL (ref 80.0–100.0)
Monocytes Absolute: 0.1 K/uL (ref 0.1–1.0)
Monocytes Relative: 3 %
Neutro Abs: 1.8 K/uL (ref 1.7–7.7)
Neutrophils Relative %: 74 %
Platelet Count: 122 K/uL — ABNORMAL LOW (ref 150–400)
RBC: 3.73 MIL/uL — ABNORMAL LOW (ref 3.87–5.11)
RDW: 13.4 % (ref 11.5–15.5)
WBC Count: 2.4 K/uL — ABNORMAL LOW (ref 4.0–10.5)
nRBC: 0 % (ref 0.0–0.2)

## 2023-12-10 LAB — TSH: TSH: 6.85 u[IU]/mL — ABNORMAL HIGH (ref 0.350–4.500)

## 2023-12-10 LAB — T4, FREE: Free T4: 1.01 ng/dL (ref 0.61–1.12)

## 2023-12-10 MED ORDER — CETIRIZINE HCL 10 MG PO TABS
10.0000 mg | ORAL_TABLET | Freq: Once | ORAL | Status: AC
  Administered 2023-12-10: 10 mg via ORAL
  Filled 2023-12-10: qty 1

## 2023-12-10 MED ORDER — SODIUM CHLORIDE 0.9 % IV SOLN: INTRAVENOUS | Status: DC

## 2023-12-10 MED ORDER — SODIUM CHLORIDE 0.9 % IV SOLN
80.0000 mg/m2 | Freq: Once | INTRAVENOUS | Status: AC
  Administered 2023-12-10: 156 mg via INTRAVENOUS
  Filled 2023-12-10: qty 26

## 2023-12-10 MED ORDER — PANTOPRAZOLE SODIUM 40 MG PO TBEC: 40.0000 mg | DELAYED_RELEASE_TABLET | Freq: Two times a day (BID) | ORAL | 2 refills | Status: DC

## 2023-12-10 MED ORDER — SODIUM CHLORIDE 0.9% FLUSH: 10.0000 mL | INTRAVENOUS | Status: DC | PRN

## 2023-12-10 MED ORDER — FAMOTIDINE IN NACL 20-0.9 MG/50ML-% IV SOLN
20.0000 mg | Freq: Once | INTRAVENOUS | Status: AC
  Administered 2023-12-10: 20 mg via INTRAVENOUS
  Filled 2023-12-10: qty 50

## 2023-12-10 NOTE — Patient Instructions (Signed)
 [facility] - A DEPT OF . [facility]  Discharge Instructions: Thank you for choosing [facility] to provide your oncology and hematology care.   If you have a lab appointment with the Cancer Center, please go directly to the Cancer Center and check in at the registration area.   Wear comfortable clothing and clothing appropriate for easy access to any Portacath or PICC line.   We strive to give you quality time with your provider. You may need to reschedule your appointment if you arrive late (15 or more minutes).  Arriving late affects you and other patients whose appointments are after yours.  Also, if you miss three or more appointments without notifying the office, you may be dismissed from the clinic at the provider's discretion.      For prescription refill requests, have your pharmacy contact our office and allow 72 hours for refills to be completed.    Today you received the following chemotherapy and/or immunotherapy agents taxol       To help prevent nausea and vomiting after your treatment, we encourage you to take your nausea medication as directed.  BELOW ARE SYMPTOMS THAT SHOULD BE REPORTED IMMEDIATELY: *FEVER GREATER THAN 100.4 F (38 C) OR HIGHER *CHILLS OR SWEATING *NAUSEA AND VOMITING THAT IS NOT CONTROLLED WITH YOUR NAUSEA MEDICATION *UNUSUAL SHORTNESS OF BREATH *UNUSUAL BRUISING OR BLEEDING *URINARY PROBLEMS (pain or burning when urinating, or frequent urination) *BOWEL PROBLEMS (unusual diarrhea, constipation, pain near the anus) TENDERNESS IN MOUTH AND THROAT WITH OR WITHOUT PRESENCE OF ULCERS (sore throat, sores in mouth, or a toothache) UNUSUAL RASH, SWELLING OR PAIN  UNUSUAL VAGINAL DISCHARGE OR ITCHING   Items with * indicate a potential emergency and should be followed up as soon as possible or go to the Emergency Department if any problems should occur.  Please show the CHEMOTHERAPY ALERT CARD or IMMUNOTHERAPY ALERT  CARD at check-in to the Emergency Department and triage nurse.  Should you have questions after your visit or need to cancel or reschedule your appointment, please contact [facility]  Dept: [phone]  and follow the prompts.  Office hours are 8:00 a.m. to 4:30 p.m. Monday - Friday. Please note that voicemails left after 4:00 p.m. may not be returned until the following business day.  We are closed weekends and major holidays. You have access to a nurse at all times for urgent questions. Please call the main number to the clinic Dept: [phone] and follow the prompts.   For any non-urgent questions, you may also contact your provider using MyChart. We now offer e-Visits for anyone 80 and older to request care online for non-urgent symptoms. For details visit [email or web address].   Also download the MyChart app! Go to the app store, search "MyChart", open the app, select [place], and log in with your MyChart username and password.

## 2023-12-10 NOTE — Progress Notes (Signed)
 [facility]   Telephone:([phone]) [phone] [phone]   Clinic Follow up Note   Patient Care Team: [name], MD as PCP - General (Internal Medicine) Glean Stephane BROCKS, RN (Inactive) as Oncology Nurse Navigator [name], RN as Oncology Nurse Navigator [name], MD as Consulting Physician (General Surgery) [name], MD as Consulting Physician (Hematology) [name], MD as Consulting Physician (Radiation Oncology) [name], MD as Consulting Physician (Endocrinology) [name], MD as Consulting Physician (Sports Medicine) [name], MD as Consulting Physician (Obstetrics and Gynecology) [name], [name], NP as Nurse Practitioner (Nurse Practitioner) Imaging, [facility] as Radiologist (Diagnostic Radiology)  Date of Service:  12/10/2023  CHIEF COMPLAINT: f/u of breast cancer  CURRENT THERAPY:  Neoadjuvant chemotherapy carboplatin , paclitaxel  and Keytruda   Oncology History   Ductal carcinoma in situ (DCIS) of left breast -diagnosed in 12/05/2022, ER-/PR-, g2 -s/p left lumpectomy by Dr. Aron on January 01, 2023.  Surgical path showed intermediate grade DCIS with necrosis, margins were negative. -She has completed adjuvant radiation -No role for adjuvant antiestrogen therapy -Continue breast cancer surveillance  Malignant neoplasm of upper-outer quadrant of right breast in female, estrogen receptor negative (HCC) IDC and DCIS, Stage IA, pT1b, cN0, triple negative, Grade 3, chest wall recurrence in 10/2023  -found on screening mammogram. S/p lumpectomy on 01/08/22 with Dr. Aron, path showed: 8 mm invasive and in situ ductal carcinoma with negative margins. Repeat prognostic panel confirmed triple negative disease.  -lymph node biopsies 01/28/22 showed negative nodes (0/2). Port placed at time of procedure. Postoperative course complicated by pneumothorax, which has resolved. -she began adjuvant TC on 02/20/22,  and completed planned 4 cycles on 04/24/2022. -she completed adjuvant RT on 07/09/2022  -biopsy confirmed chest lymph nodes (axilla and chest wall including posteriad pectoralis muscle)  recurrence in 10/2023 - Case reviewed in breast tumor board, plan to start neoadjuvant chemotherapy with carboplatin  and paclitaxel  every 3 weeks for 4 cycles, followed by AC every 3 weeks for 4 cycles, along with immunotherapy Keytruda  for 1 year.  If she has good response to treatment, Dr. Aron will consider surgery although she is not able to remove the aubpectoral lymph node.  She may need adjuvant radiation after surgery.  Assessment & Plan Breast cancer on active chemotherapy Currently undergoing chemotherapy with Keytruda  and another agent. Experiencing significant side effects from initial treatments, but the current regimen is more tolerable. - Proceed with chemotherapy as scheduled on Tuesday - Perform labs prior to chemotherapy  Chemotherapy-induced neutropenia White blood cell count is low at 2.4, attributed to chemotherapy. Previous week's count was 3.0. - Administer three injections to support white blood cell recovery  Chemotherapy-induced anemia Hemoglobin is slightly low at 11.8, which is expected from chemotherapy. No need for blood transfusion at this time.  Chemotherapy-induced rash Rash observed on thigh, likely related to chemotherapy drugs, possibly Taxol . Consideration to switch from Taxol  to Abraxane  to reduce allergic reactions. Abraxane  is linked with albumin, which may result in fewer allergic reactions. - Check insurance approval for Abraxane  - Switch from Taxol  to Abraxane  if approved - Continue Claritin until rash resolves  Gastroesophageal reflux disease (GERD) Long-standing GERD exacerbated by chemotherapy. Previous treatments with Prilosec, Nexium, and Protonix  have been tried. Protonix  was effective in the past. - Call in prescription for Protonix   Chemotherapy-induced  alopecia Significant hair loss despite using DigniCap. Hair loss is expected to be less than half, but she reports it is significant. - Discontinue DigniCap  Bone pain related to chemotherapy Experiencing bone pain, likely related to chemotherapy. Claritin and Tylenol  have been used for management. - Continue Claritin for bone pain - Use Tylenol  as needed for pain relief  Plan - Lab reviewed, adequate for treatment, will proceed cycle 2-day 8 paclitaxel  today - Due to her significant skin rashes, will change Taxol  to Abraxane  from next cycle - She will return next week for paclitaxel  and G-CSF dailyX3 -f/u in 2 weeks    SUMMARY OF ONCOLOGIC HISTORY: Oncology History Overview Note   Cancer Staging  Malignant neoplasm of upper-outer quadrant of right breast in female, estrogen receptor negative Staging form: Breast, AJCC 8th Edition - Clinical stage from 12/23/2021: cT1b, cM0, G3, ER-, PR-, HER2- - Unsigned Stage prefix: Initial diagnosis Histologic grading system: 3 grade system - Pathologic stage from 01/08/2022: Stage IB (pT1b, pN0, cM0, G3, ER-, PR-, HER2-) - Signed by [name], MD on 01/24/2022 Stage prefix: Initial diagnosis Histologic grading system: 3 grade system Residual tumor (R): R0 - None     Malignant neoplasm of upper-outer quadrant of right breast in female, estrogen receptor negative (HCC)  12/06/2021 Mammogram   CLINICAL DATA:  Patient returns today to evaluate RIGHT breast calcifications identified on a recent screening mammogram.   EXAM: DIGITAL DIAGNOSTIC UNILATERAL RIGHT MAMMOGRAM  IMPRESSION: Grouped coarse heterogeneous calcifications within the upper-outer quadrant of the RIGHT breast, measuring 6 mm extent. These may be fibroadenomatous calcifications. Stereotactic biopsy is recommended to exclude malignancy.   12/18/2021 Initial Biopsy   Diagnosis Breast, right, needle core biopsy, upper outer quadrant, x clip - DUCTAL CARCINOMA IN SITU, SOLID AND  CRIBRIFORM TYPE WITH COMEDONECROSIS AND ASSOCIATED CALCIFICATIONS, NUCLEAR GRADE 3 OF 3 - FOCAL MICROINVASION IS PRESENT (LESS THAN 1 MM) WITH EVIDENCE OF LYMPHOVASCULAR INVASION - NECROSIS: PRESENT - CALCIFICATIONS: PRESENT - DCIS LENGTH: 7 MM IN GREATEST LINEAR DIMENSION ON FRAGMENTED CORES  PROGNOSTIC INDICATORS Results: IMMUNOHISTOCHEMICAL AND MORPHOMETRIC ANALYSIS PERFORMED MANUALLY The tumor cells are NEGATIVE for Her2 (1+). Estrogen Receptor: 0%, NEGATIVE Progesterone Receptor: 0%, NEGATIVE Proliferation Marker Ki67: 60%   12/23/2021 Initial Diagnosis   Malignant neoplasm of upper-outer quadrant of right breast in female, estrogen receptor negative (HCC)   12/23/2021 Imaging   EXAM: ULTRASOUND OF THE RIGHT AXILLA  IMPRESSION: No abnormal appearing RIGHT axillary lymph nodes.   01/08/2022 Cancer Staging   Staging form: Breast, AJCC 8th Edition - Pathologic stage from 01/08/2022: Stage IB (pT1b, pN0, cM0, G3, ER-, PR-, HER2-) - Signed by [name], MD on 01/24/2022 Stage prefix: Initial diagnosis Histologic grading system: 3 grade system Residual tumor (R): R0 - None   02/20/2022 - 04/24/2022 Chemotherapy   Patient is on Treatment Plan : BREAST TC q21d     10/06/2022 Survivorship   SCP delivered by Lacie [name], NP   11/13/2023 -  Chemotherapy   Patient is on Treatment Plan : BREAST Pembrolizumab  (200) D1 + Carboplatin  (5) D1 + Paclitaxel  (80) D1,8,15 q21d X 4 cycles / Pembrolizumab  (200) D1 + AC D1 q21d x 4 cycles     Ductal carcinoma in situ (DCIS) of left breast  12/09/2022 Initial Diagnosis   Ductal carcinoma in situ (DCIS) of left breast   01/01/2023 Surgery   Let breast seed localized lumpectomy.with Dr. Aron  2.5 cm DCIS with negative margins.     02/11/2023 - 03/10/2023 Radiation Therapy   Dose per fraction - 2.66 Gy Prescribed dose (delivered/prescribed)  42.56/42.56 Prescribed Fxs (delivered/prescribed)  16/16  Boost  Dose per fraction -  2Gy  Prescribed  dose (delivered/prescribed) 8Gy/8Gy) Prescribed Fxs (delivered/prescribed) (4Gy/4Gy)        Discussed the use of AI scribe software for clinical note transcription with the patient, who gave verbal consent to proceed.  History of Present Illness Samantha [name] is a 63 year old female with breast cancer who presents for follow-up.  She is undergoing chemotherapy with Taxol  and Keytruda , with her next infusion scheduled. Her hemoglobin is 11.8, and her platelet count is normal.  She experiences worsening acid reflux, which she attributes to chemotherapy. She has tried Prilosec, Nexium, and is currently on generic omeprazole. She experiences burning sensations from her throat and has tried elevating her head at night. Protonix  has previously improved her symptoms.  She experiences significant hair loss despite using DigniCap. She has a rash on her thigh, associated with her chemotherapy regimen, particularly after receiving Taxol  and Keytruda . She has not tried Benadryl  or Claritin for the rash.  She takes gabapentin  at night and uses ice for bone pain associated with her treatment.     All other systems were reviewed with the patient and are negative.  MEDICAL HISTORY:  Past Medical History:  Diagnosis Date   Allergy    Anxiety 2015   Result of culmination of super stressful caregiving and deaths of 2 immediate family members.  Overall do pretty well.   Cancer [facility]) 2023   right breast   Coronary artery calcification    Depression    denies   GERD (gastroesophageal reflux disease)    Hyperlipidemia    Hypothyroidism    Right carpal tunnel syndrome 09/19/2019   Thyroid  disease     SURGICAL HISTORY: Past Surgical History:  Procedure Laterality Date   BREAST BIOPSY Left 12/04/2022   MM LT BREAST BX W LOC DEV 1ST LESION IMAGE BX SPEC STEREO GUIDE 12/04/2022 [facility]   BREAST BIOPSY  12/30/2022   MM LT RADIOACTIVE SEED LOC MAMMO GUIDE 12/30/2022 [facility]    BREAST LUMPECTOMY WITH RADIOACTIVE SEED LOCALIZATION Right 01/08/2022   Procedure: RIGHT BREAST LUMPECTOMY WITH RADIOACTIVE SEED LOCALIZATION;  Surgeon: [name], MD;  Location: [facility] OR;  Service: General;  Laterality: Right;   BREAST LUMPECTOMY WITH RADIOACTIVE SEED LOCALIZATION Left 01/01/2023   Procedure: LEFT BREAST SEED LOCALIZED LUMPECTOMY;  Surgeon: [name], MD;  Location: [facility] OR;  Service: General;  Laterality: Left;   PORT-A-CATH REMOVAL Left 05/20/2022   Procedure: REMOVAL PORT-A-CATH;  Surgeon: [name], MD;  Location: [facility];  Service: General;  Laterality: Left;   PORTACATH PLACEMENT Left 01/28/2022   Procedure: INSERTION PORT-A-CATH;  Surgeon: [name], MD;  Location: [facility];  Service: General;  Laterality: Left;   PORTACATH PLACEMENT Left 11/04/2023   Procedure: INSERTION, TUNNELED CENTRAL VENOUS DEVICE, WITH PORT;  Surgeon: [name], MD;  Location: [facility];  Service: General;  Laterality: Left;  PORT PLACEMENT WITH ULTRASOUND GUIDANCE   SENTINEL NODE BIOPSY Right 01/28/2022   Procedure: RIGHT SENTINEL LYMPH NODE BIOPSY;  Surgeon: [name], MD;  Location: [facility];  Service: General;  Laterality: Right;   TONSILLECTOMY      I have reviewed the social history and family history with the patient and they are unchanged from previous note.  ALLERGIES:  is allergic to nortriptyline , singulair  [montelukast  sodium], sulfonamide derivatives, amitriptyline  hcl, esomeprazole, fluticasone , gabapentin , lexapro [escitalopram], neomycin, pseudoephedrine, and neosporin [bacitracin-polymyxin b].  MEDICATIONS:  Current Outpatient Medications  Medication Sig Dispense Refill   pantoprazole  (PROTONIX ) 40 MG  tablet Take 1 tablet (40 mg total) by mouth 2 (two) times daily. 60 tablet 2   acetaminophen  (TYLENOL ) 325 MG tablet Take 650 mg by mouth every 6 (six) hours as needed.     ALPRAZolam (XANAX) 0.5 MG  tablet Take 0.5 mg by mouth at bedtime as needed for anxiety.     atorvastatin (LIPITOR) 20 MG tablet Take 20 mg by mouth every evening.     betamethasone dipropionate 0.05 % cream Apply 1 Application topically 2 (two) times daily as needed (skin irritation.).     BIOTIN PO Take 1 tablet by mouth 2 (two) times a week. With lunch (Patient not taking: Reported on 11/26/2023)     Calcium  Carb-Cholecalciferol (CALCIUM -VITAMIN D3) 250-125 MG-UNIT TABS Take 1 tablet by mouth daily with lunch.     Cholecalciferol (VITAMIN D3) 50 MCG (2000 UT) TABS Take 2,000 Units by mouth daily with lunch.     Clobetasol  Prop Emollient Base 0.05 % emollient cream Apply 1 Application topically 2 (two) times daily. 60 g 1   ezetimibe (ZETIA) 10 MG tablet Take 10 mg by mouth every evening.     famotidine  (PEPCID ) 20 MG tablet Take 1 tablet (20 mg total) by mouth daily. (Patient taking differently: Take 20 mg by mouth daily as needed for heartburn or indigestion.)     Fezolinetant (VEOZAH) 45 MG TABS Take 45 mg by mouth in the morning. (Patient not taking: Reported on 11/26/2023)     lidocaine -prilocaine  (EMLA ) cream Apply to affected area once 30 g 3   loperamide (IMODIUM) 2 MG capsule Take by mouth as needed for diarrhea or loose stools.     magic mouthwash (nystatin , diphenhydrAMINE , alum & mag hydroxide) suspension mixture Swish and spit 5 mLs 4 (four) times daily as needed for mouth pain. 140 mL 1   nystatin -triamcinolone (MYCOLOG II) cream Apply 1 Application topically 2 (two) times daily as needed (irritation).     ondansetron  (ZOFRAN ) 8 MG tablet Take 1 tablet (8 mg total) by mouth every 8 (eight) hours as needed for nausea or vomiting. Start on the third day after chemotherapy. 30 tablet 1   prochlorperazine  (COMPAZINE ) 10 MG tablet Take 1 tablet (10 mg total) by mouth every 6 (six) hours as needed for nausea or vomiting. 30 tablet 1   sertraline  (ZOLOFT ) 50 MG tablet Take 50 mg by mouth in the morning.     tacrolimus  (PROTOPIC) 0.03 % ointment Apply 1 Application topically 2 (two) times daily as needed (skin irritation.).     TIROSINT  100 MCG CAPS Take 100 mcg by mouth See admin instructions. Take 1 capsule (100 mcg) by mouth every 3 days, alternating with (Tirosint  88 mcg)--take on an empty stomach.     TIROSINT  88 MCG CAPS Take 88 mcg by mouth See admin instructions. Take 1 capsule (88 mcg) by mouth 2 days in a row, alternating with Tirosint  (100 mcg) on the 3 rd day.     triamcinolone ointment (KENALOG) 0.1 % Apply 1 Application topically 2 (two) times daily as needed (skin irritation.).     valACYclovir (VALTREX) 500 MG tablet Take 500 mg by mouth 2 (two) times daily as needed (cold sore/fever blisters).     No current facility-administered medications for this visit.    PHYSICAL EXAMINATION: ECOG PERFORMANCE STATUS: 1 - Symptomatic but completely ambulatory  There were no vitals filed for this visit. Wt Readings from Last 3 Encounters:  12/10/23 168 lb (76.2 kg)  12/03/23 166 lb (75.3 kg)  12/03/23 166 lb (75.3 kg)     GENERAL:alert, no distress and comfortable SKIN: skin color, texture, turgor are normal, diffuse papular skin rash on her arms and legs. EYES: normal, Conjunctiva are pink and non-injected, sclera clear NECK: supple, thyroid  normal size, non-tender, without nodularity LYMPH:  no palpable lymphadenopathy in the cervical, axillary  LUNGS: clear to auscultation and percussion with normal breathing effort HEART: regular rate & rhythm and no murmurs and no lower extremity edema ABDOMEN:abdomen soft, non-tender and normal bowel sounds Musculoskeletal:no cyanosis of digits and no clubbing  NEURO: alert & oriented x 3 with fluent speech, no focal motor/sensory deficits  Physical Exam   LABORATORY DATA:  I have reviewed the data as listed    Latest Ref Rng & Units 12/10/2023   11:43 AM 12/03/2023   10:02 AM 11/30/2023   12:05 PM  CBC  WBC 4.0 - 10.5 K/uL 2.4  3.0  8.2    Hemoglobin 12.0 - 15.0 g/dL 88.1  87.4  86.9   Hematocrit 36.0 - 46.0 % 35.0  37.2  39.3   Platelets 150 - 400 K/uL 122  137  163         Latest Ref Rng & Units 12/10/2023   11:43 AM 12/03/2023   10:02 AM 11/26/2023   10:44 AM  CMP  Glucose 70 - 99 mg/dL 870  889  99   BUN 8 - 23 mg/dL 12  12  8    Creatinine 0.44 - 1.00 mg/dL 9.46  9.44  9.43   Sodium 135 - 145 mmol/L 139  139  141   Potassium 3.5 - 5.1 mmol/L 4.1  3.9  3.9   Chloride 98 - 111 mmol/L 104  104  106   CO2 22 - 32 mmol/L 30  30  30    Calcium  8.9 - 10.3 mg/dL 9.5  9.2  9.2   Total Protein 6.5 - 8.1 g/dL 7.1  7.1  7.1   Total Bilirubin 0.0 - 1.2 mg/dL 0.5  0.4  0.4   Alkaline Phos 38 - 126 U/L 105  112  106   AST 15 - 41 U/L 36  23  29   ALT 0 - 44 U/L 62  36  53       RADIOGRAPHIC STUDIES: I have personally reviewed the radiological images as listed and agreed with the findings in the report. No results found.    No orders of the defined types were placed in this encounter.  All questions were answered. The patient knows to call the clinic with any problems, questions or concerns. No barriers to learning was detected. The total time spent in the appointment was 40 minutes, including review of chart and various tests results, discussions about plan of care and coordination of care plan     Onita Mattock, MD 12/10/2023

## 2023-12-10 NOTE — Assessment & Plan Note (Signed)
-  diagnosed in 12/05/2022, ER-/PR-, g2 -s/p left lumpectomy by Dr. Donell Beers on January 01, 2023.  Surgical path showed intermediate grade DCIS with necrosis, margins were negative. -She has completed adjuvant radiation -No role for adjuvant antiestrogen therapy -Continue breast cancer surveillance

## 2023-12-10 NOTE — Assessment & Plan Note (Signed)
 IDC and DCIS, Stage IA, pT1b, cN0, triple negative, Grade 3, chest wall recurrence in 10/2023  -found on screening mammogram. S/p lumpectomy on 01/08/22 with Dr. Aron, path showed: 8 mm invasive and in situ ductal carcinoma with negative margins. Repeat prognostic panel confirmed triple negative disease.  -lymph node biopsies 01/28/22 showed negative nodes (0/2). Port placed at time of procedure. Postoperative course complicated by pneumothorax, which has resolved. -she began adjuvant TC on 02/20/22, and completed planned 4 cycles on 04/24/2022. -she completed adjuvant RT on 07/09/2022  -biopsy confirmed chest lymph nodes (axilla and chest wall including posteriad pectoralis muscle)  recurrence in 10/2023 - Case reviewed in breast tumor board, plan to start neoadjuvant chemotherapy with carboplatin  and paclitaxel  every 3 weeks for 4 cycles, followed by AC every 3 weeks for 4 cycles, along with immunotherapy Keytruda  for 1 year.  If she has good response to treatment, Dr. Aron will consider surgery although she is not able to remove the aubpectoral lymph node.  She may need adjuvant radiation after surgery.

## 2023-12-12 NOTE — Assessment & Plan Note (Signed)
 IDC and DCIS, Stage IA, pT1b, cN0, triple negative, Grade 3, chest wall recurrence in 10/2023  -found on screening mammogram. S/p lumpectomy on 01/08/22 with Dr. Aron, path showed: 8 mm invasive and in situ ductal carcinoma with negative margins. Repeat prognostic panel confirmed triple negative disease.  -lymph node biopsies 01/28/22 showed negative nodes (0/2). Port placed at time of procedure. Postoperative course complicated by pneumothorax, which has resolved. -she began adjuvant TC on 02/20/22, and completed planned 4 cycles on 04/24/2022. -she completed adjuvant RT on 07/09/2022  -biopsy confirmed chest lymph nodes (axilla and chest wall including posteriad pectoralis muscle)  recurrence in 10/2023 - Case reviewed in breast tumor board, plan to start neoadjuvant chemotherapy with carboplatin  and paclitaxel  every 3 weeks for 4 cycles, followed by AC every 3 weeks for 4 cycles, along with immunotherapy Keytruda  for 1 year.  If she has good response to treatment, Dr. Aron will consider surgery although she is not able to remove the aubpectoral lymph node.  She may need adjuvant radiation after surgery. -currently on weekly Taxol  and Keytruda  every 3 weeks. Has developed rash, anemia, and leukocytosis. Changed to Abraxane  (protein bound paclitaxol) with Cycle 3. Will receive G-CSF for 3 days.  --12/15/2023 - worsening neutropenia. Will proceed with treatment 12/16/2023 with abraxane  and carboplatin . She will receive Zarzio injections 12/17/2023, 12/18/2023, and 12/19/2023.

## 2023-12-12 NOTE — Progress Notes (Unsigned)
 Patient Care Team: [name], MD as PCP - General (Internal Medicine) Glean Stephane BROCKS, RN (Inactive) as Oncology Nurse Navigator [name], RN as Oncology Nurse Navigator [name], MD as Consulting Physician (General Surgery) [name], MD as Consulting Physician (Hematology) [name], MD as Consulting Physician (Radiation Oncology) [name], MD as Consulting Physician (Endocrinology) [name], MD as Consulting Physician (Sports Medicine) [name], MD as Consulting Physician (Obstetrics and Gynecology) [name], [name], NP as Nurse Practitioner (Nurse Practitioner) Imaging, [facility] as Radiologist (Diagnostic Radiology)  Clinic Day:  12/15/2023  Referring physician: [name], MD  ASSESSMENT & PLAN:   Assessment & Plan: Malignant neoplasm of upper-outer quadrant of right breast in female, estrogen receptor negative (HCC) IDC and DCIS, Stage IA, pT1b, cN0, triple negative, Grade 3, chest wall recurrence in 10/2023  -found on screening mammogram. S/p lumpectomy on 01/08/22 with Dr. Aron, path showed: 8 mm invasive and in situ ductal carcinoma with negative margins. Repeat prognostic panel confirmed triple negative disease.  -lymph node biopsies 01/28/22 showed negative nodes (0/2). Port placed at time of procedure. Postoperative course complicated by pneumothorax, which has resolved. -she began adjuvant TC on 02/20/22, and completed planned 4 cycles on 04/24/2022. -she completed adjuvant RT on 07/09/2022  -biopsy confirmed chest lymph nodes (axilla and chest wall including posteriad pectoralis muscle)  recurrence in 10/2023 - Case reviewed in breast tumor board, plan to start neoadjuvant chemotherapy with carboplatin  and paclitaxel  every 3 weeks for 4 cycles, followed by AC every 3 weeks for 4 cycles, along with immunotherapy Keytruda  for 1 year.  If she has good response to treatment, Dr. Aron will consider surgery although she is  not able to remove the aubpectoral lymph node.  She may need adjuvant radiation after surgery. -currently on weekly Taxol  and Keytruda  every 3 weeks. Has developed rash, anemia, and leukocytosis. Changed to Abraxane  (protein bound paclitaxol) with Cycle 3. Will receive G-CSF for 3 days.  --12/15/2023 - worsening neutropenia. Will proceed with treatment 12/16/2023 with abraxane  and carboplatin . She will receive Zarzio injections 12/17/2023, 12/18/2023, and 12/19/2023.    Rash Patient continues to have a red, raised rash on both lower legs and both arms.  Rash is not itchy, but starting to have some nodules under the lesions.  She is using previously prescribed clobetasol  cream.  Difficult to assess whether or not this is helping since rash is not itchy.  Skin is intact with no evidence of drainage.  No warmth or swelling is otherwise noted.  Patient is due to start Abraxane  with carboplatin  tomorrow.  This will hopefully diminish the side effect of rash.  Instructed her to continue using platelet cream twice daily.  Neutropenia WBC 1.3 with ANC of 0.6 today.  Patient scheduled to receive Zarzio injections on 12/17/2023, 12/18/2023, and 12/19/2023 to improve neutropenia and help prevent infection.  She has received G-CSF injections in the past.  Caused severe bone pain, especially in both of her legs.  She is already taking Claritin daily.  States that did not work in the past.  Afraid to take ibuprofen  or Aleve as these have raised her liver functions in the past.  Will send a short-term prescription for hydrocodone /APAP 5/325 mg tablets to her pharmacy.  Advised to take Tylenol  and alternate with Aleve.  Aleve tends to get better pain relief with less frequent dosing.  If this is ineffective, she may try hydrocodone /APAP and alternate with Aleve.  Will continue to monitor for  elevated liver functions and will change regimen as indicated.  Anemia Stable with Hgb 11.1 and HCT 33.4. Will continue to monitor with every visit  and treat iron deficiency as indicated.   Taste changes Patient states that since starting on chemotherapy, her taste for food isn't the same. In fact, many foods taste bad. It's making it difficult for her to eat. She states that the magic mouthwash does help to keep her tongue from having a burning sensation or from developing thrush. She has had a 5 pound weight loss since last week. We discussed increasing protein and calore intake and aggressively pushing fluids. I have referred her for consultation with dietician for further evaluation.    Plan: Labs reviewed.  -neutropenia slightly worse.  -stable anemia -slight elevation of ALT, but improved from recent check.  Ok to proceed with Abraxane  and carboplatin  tomorrow (12/16/2023). -will receive zarzio injections on 12/17/2023, 12/18/2023, and 12/19/2023.  Lbs, follow up, and subsequent injections as scheduled.    The patient understands the plans discussed today and is in agreement with them.  She knows to contact our office if she develops concerns prior to her next appointment.  I provided 25 minutes of face-to-face time during this encounter and > 50% was spent counseling as documented under my assessment and plan.    Powell FORBES Lessen, NP  [facility] - A DEPT OF [facility] [address] [place] [address] Dept: [phone] Dept Fax: [phone]   Orders Placed This Encounter  Procedures   Ambulatory Referral to [facility]    Referral Priority:   Routine    Referral Type:   Consultation    Referral Reason:   Specialty Services Required    Number of Visits Requested:   1      CHIEF COMPLAINT:  CC: Follow-up breast cancer  Current Treatment: Neoadjuvant chemotherapy carboplatin , abraxanel, and immunotherapy Keytruda   INTERVAL HISTORY:  Samantha [name] is here today for repeat clinical assessment.  She was most recently seen by Dr. On 12/10/2023. Lanny she was  having right #5, likely related to Taxol .  She also had anemia and decreased WBC.  She presents today for repeat labs.  Plan to proceed with Abraxane  tomorrow as there are less negative side effects associated with Abraxane  (protein bound paclitaxel ).  She will get G-CSF injections daily for 3 days, starting the day after chemotherapy. She continues to have the rash on both lower legs and arms. Lesions are of different sizes and some have developed nodules. They do not itch. She is using previously prescribed clobetasol  cream twice daily. She denies shortness of breath, or swelling in the mouth or throat. denies chest pain or chest pressure. She denies headaches or visual disturbances. She denies abdominal pain, nausea, vomiting, or changes in bowel or bladder habits.  She denies fevers or chills. She states that she does have some hot flashes every once in a while since she stopped the Veoz[place]. She denies pain. Her appetite is poor to fair. She states that her tastes are different and many foods taste bad to her. Her weight has decreased 5 pounds over last week.  I have reviewed the past medical history, past surgical history, social history and family history with the patient and they are unchanged from previous note.  ALLERGIES:  is allergic to nortriptyline , singulair  [montelukast  sodium], sulfonamide derivatives, amitriptyline  hcl, esomeprazole, fluticasone , gabapentin , lexapro [escitalopram], neomycin, pseudoephedrine, and neosporin [bacitracin-polymyxin b].  MEDICATIONS:  Current Outpatient Medications  Medication Sig Dispense Refill   HYDROcodone -acetaminophen  (NORCO/VICODIN) 5-325 MG tablet Take 1 tablet by mouth every 6 (six) hours as needed for moderate pain (pain score 4-6). 15 tablet 0   acetaminophen  (TYLENOL ) 325 MG tablet Take 650 mg by mouth every 6 (six) hours as needed.     ALPRAZolam (XANAX) 0.5 MG tablet Take 0.5 mg by mouth at bedtime as needed for anxiety.     atorvastatin (LIPITOR)  20 MG tablet Take 20 mg by mouth every evening.     betamethasone dipropionate 0.05 % cream Apply 1 Application topically 2 (two) times daily as needed (skin irritation.).     BIOTIN PO Take 1 tablet by mouth 2 (two) times a week. With lunch (Patient not taking: Reported on 11/26/2023)     Calcium  Carb-Cholecalciferol (CALCIUM -VITAMIN D3) 250-125 MG-UNIT TABS Take 1 tablet by mouth daily with lunch.     Cholecalciferol (VITAMIN D3) 50 MCG (2000 UT) TABS Take 2,000 Units by mouth daily with lunch.     Clobetasol  Prop Emollient Base 0.05 % emollient cream Apply 1 Application topically 2 (two) times daily. 60 g 1   ezetimibe (ZETIA) 10 MG tablet Take 10 mg by mouth every evening.     famotidine  (PEPCID ) 20 MG tablet Take 1 tablet (20 mg total) by mouth daily. (Patient taking differently: Take 20 mg by mouth daily as needed for heartburn or indigestion.)     Fezolinetant (VEOZAH) 45 MG TABS Take 45 mg by mouth in the morning. (Patient not taking: Reported on 11/26/2023)     lidocaine -prilocaine  (EMLA ) cream Apply to affected area once 30 g 3   loperamide (IMODIUM) 2 MG capsule Take by mouth as needed for diarrhea or loose stools.     magic mouthwash (nystatin , diphenhydrAMINE , alum & mag hydroxide) suspension mixture Swish and spit 5 mLs 4 (four) times daily as needed for mouth pain. 140 mL 1   nystatin -triamcinolone (MYCOLOG II) cream Apply 1 Application topically 2 (two) times daily as needed (irritation).     ondansetron  (ZOFRAN ) 8 MG tablet Take 1 tablet (8 mg total) by mouth every 8 (eight) hours as needed for nausea or vomiting. Start on the third day after chemotherapy. 30 tablet 1   pantoprazole  (PROTONIX ) 40 MG tablet Take 1 tablet (40 mg total) by mouth 2 (two) times daily. 60 tablet 2   prochlorperazine  (COMPAZINE ) 10 MG tablet Take 1 tablet (10 mg total) by mouth every 6 (six) hours as needed for nausea or vomiting. 30 tablet 1   sertraline  (ZOLOFT ) 50 MG tablet Take 50 mg by mouth in the  morning.     tacrolimus (PROTOPIC) 0.03 % ointment Apply 1 Application topically 2 (two) times daily as needed (skin irritation.).     TIROSINT  100 MCG CAPS Take 100 mcg by mouth See admin instructions. Take 1 capsule (100 mcg) by mouth every 3 days, alternating with (Tirosint  88 mcg)--take on an empty stomach.     TIROSINT  88 MCG CAPS Take 88 mcg by mouth See admin instructions. Take 1 capsule (88 mcg) by mouth 2 days in a row, alternating with Tirosint  (100 mcg) on the 3 rd day.     triamcinolone ointment (KENALOG) 0.1 % Apply 1 Application topically 2 (two) times daily as needed (skin irritation.).     valACYclovir (VALTREX) 500 MG tablet Take 500 mg by mouth 2 (two) times daily as needed (cold sore/fever blisters).     No current facility-administered medications for this  visit.    HISTORY OF PRESENT ILLNESS:   Oncology History Overview Note   Cancer Staging  Malignant neoplasm of upper-outer quadrant of right breast in female, estrogen receptor negative Staging form: Breast, AJCC 8th Edition - Clinical stage from 12/23/2021: cT1b, cM0, G3, ER-, PR-, HER2- - Unsigned Stage prefix: Initial diagnosis Histologic grading system: 3 grade system - Pathologic stage from 01/08/2022: Stage IB (pT1b, pN0, cM0, G3, ER-, PR-, HER2-) - Signed by [name], MD on 01/24/2022 Stage prefix: Initial diagnosis Histologic grading system: 3 grade system Residual tumor (R): R0 - None     Malignant neoplasm of upper-outer quadrant of right breast in female, estrogen receptor negative (HCC)  12/06/2021 Mammogram   CLINICAL DATA:  Patient returns today to evaluate RIGHT breast calcifications identified on a recent screening mammogram.   EXAM: DIGITAL DIAGNOSTIC UNILATERAL RIGHT MAMMOGRAM  IMPRESSION: Grouped coarse heterogeneous calcifications within the upper-outer quadrant of the RIGHT breast, measuring 6 mm extent. These may be fibroadenomatous calcifications. Stereotactic biopsy is recommended to  exclude malignancy.   12/18/2021 Initial Biopsy   Diagnosis Breast, right, needle core biopsy, upper outer quadrant, x clip - DUCTAL CARCINOMA IN SITU, SOLID AND CRIBRIFORM TYPE WITH COMEDONECROSIS AND ASSOCIATED CALCIFICATIONS, NUCLEAR GRADE 3 OF 3 - FOCAL MICROINVASION IS PRESENT (LESS THAN 1 MM) WITH EVIDENCE OF LYMPHOVASCULAR INVASION - NECROSIS: PRESENT - CALCIFICATIONS: PRESENT - DCIS LENGTH: 7 MM IN GREATEST LINEAR DIMENSION ON FRAGMENTED CORES  PROGNOSTIC INDICATORS Results: IMMUNOHISTOCHEMICAL AND MORPHOMETRIC ANALYSIS PERFORMED MANUALLY The tumor cells are NEGATIVE for Her2 (1+). Estrogen Receptor: 0%, NEGATIVE Progesterone Receptor: 0%, NEGATIVE Proliferation Marker Ki67: 60%   12/23/2021 Initial Diagnosis   Malignant neoplasm of upper-outer quadrant of right breast in female, estrogen receptor negative (HCC)   12/23/2021 Imaging   EXAM: ULTRASOUND OF THE RIGHT AXILLA  IMPRESSION: No abnormal appearing RIGHT axillary lymph nodes.   01/08/2022 Cancer Staging   Staging form: Breast, AJCC 8th Edition - Pathologic stage from 01/08/2022: Stage IB (pT1b, pN0, cM0, G3, ER-, PR-, HER2-) - Signed by [name], MD on 01/24/2022 Stage prefix: Initial diagnosis Histologic grading system: 3 grade system Residual tumor (R): R0 - None   02/20/2022 - 04/24/2022 Chemotherapy   Patient is on Treatment Plan : BREAST TC q21d     10/06/2022 Survivorship   SCP delivered by Lacie [name], NP   11/13/2023 -  Chemotherapy   Patient is on Treatment Plan : BREAST Pembrolizumab  (200) D1 + Carboplatin  (5) D1 + Paclitaxel  (80) D1,8,15 q21d X 4 cycles / Pembrolizumab  (200) D1 + AC D1 q21d x 4 cycles     Ductal carcinoma in situ (DCIS) of left breast  12/09/2022 Initial Diagnosis   Ductal carcinoma in situ (DCIS) of left breast   01/01/2023 Surgery   Let breast seed localized lumpectomy.with Dr. Aron  2.5 cm DCIS with negative margins.     02/11/2023 - 03/10/2023 Radiation Therapy   Dose per  fraction - 2.66 Gy Prescribed dose (delivered/prescribed)  42.56/42.56 Prescribed Fxs (delivered/prescribed)  16/16  Boost  Dose per fraction -  2Gy Prescribed dose (delivered/prescribed) 8Gy/8Gy) Prescribed Fxs (delivered/prescribed) (4Gy/4Gy)         REVIEW OF SYSTEMS:   Constitutional: Denies fevers or chills. She has intermittent hot flashes. She has taste changes. Has lost 5 pounds over the past week.  Eyes: Denies blurriness of vision Ears, nose, mouth, throat, and face: Denies mucositis or sore throat Respiratory: Denies cough, dyspnea or wheezes Cardiovascular: Denies palpitation, chest discomfort or lower extremity swelling  Gastrointestinal:  Denies nausea, heartburn or change in bowel habits Skin: red rash on the lower legs and arms. Not itchy. States that some of the lesions have developed nodules underneath.  Lymphatics: Denies new lymphadenopathy or easy bruising Neurological:Denies numbness, tingling or new weaknesses Behavioral/Psych: Mood is stable, no new changes  All other systems were reviewed with the patient and are negative.   VITALS:   Today's Vitals   12/15/23 0839  BP: 116/66  Pulse: 94  Resp: 16  Temp: (!) 97.3 F (36.3 C)  TempSrc: Temporal  SpO2: 100%  Weight: 163 lb (73.9 kg)  Height: 5' 7 (1.702 m)   Body mass index is 25.53 kg/m.   Wt Readings from Last 3 Encounters:  12/15/23 163 lb (73.9 kg)  12/10/23 168 lb (76.2 kg)  12/03/23 166 lb (75.3 kg)    Body mass index is 25.53 kg/m.  Performance status (ECOG): 1 - Symptomatic but completely ambulatory  PHYSICAL EXAM:   GENERAL:alert, no distress and comfortable SKIN: skin color, texture, turgor are normal. She does have numerous, scabbed lesions on the lower legs and arms. Some have palpable nodules underneath. There are no noted breaks in the skin and no evidence of drainage or infection  today.  EYES: normal, Conjunctiva are pink and non-injected, sclera clear OROPHARYNX:no  exudate, no erythema and lips, buccal mucosa, and tongue normal  NECK: supple, thyroid  normal size, non-tender, without nodularity LYMPH:  no palpable lymphadenopathy in the cervical, axillary or inguinal LUNGS: clear to auscultation and percussion with normal breathing effort HEART: regular rate & rhythm and no murmurs and no lower extremity edema ABDOMEN:abdomen soft, non-tender and normal bowel sounds Musculoskeletal:no cyanosis of digits and no clubbing  NEURO: alert & oriented x 3 with fluent speech, no focal motor/sensory deficits  LABORATORY DATA:  I have reviewed the data as listed    Component Value Date/Time   NA 140 12/15/2023 0815   K 3.7 12/15/2023 0815   CL 105 12/15/2023 0815   CO2 29 12/15/2023 0815   GLUCOSE 104 (H) 12/15/2023 0815   BUN 13 12/15/2023 0815   CREATININE 0.52 12/15/2023 0815   CALCIUM  9.5 12/15/2023 0815   PROT 7.1 12/15/2023 0815   ALBUMIN 4.4 12/15/2023 0815   AST 28 12/15/2023 0815   ALT 53 (H) 12/15/2023 0815   ALKPHOS 95 12/15/2023 0815   BILITOT 0.5 12/15/2023 0815   GFRNONAA >60 12/15/2023 0815   GFRAA >90 03/09/2013 2327    Lab Results  Component Value Date   WBC 1.3 (L) 12/15/2023   NEUTROABS 0.6 (L) 12/15/2023   HGB 11.1 (L) 12/15/2023   HCT 33.4 (L) 12/15/2023   MCV 95.4 12/15/2023   PLT 168 12/15/2023

## 2023-12-15 ENCOUNTER — Encounter: Payer: Self-pay | Admitting: Nurse Practitioner

## 2023-12-15 ENCOUNTER — Inpatient Hospital Stay (HOSPITAL_BASED_OUTPATIENT_CLINIC_OR_DEPARTMENT_OTHER): Admitting: Nurse Practitioner

## 2023-12-15 ENCOUNTER — Inpatient Hospital Stay: Attending: Hematology

## 2023-12-15 ENCOUNTER — Other Ambulatory Visit: Payer: Self-pay | Admitting: Nurse Practitioner

## 2023-12-15 VITALS — BP 116/66 | HR 94 | Temp 97.3°F | Resp 16 | Ht 67.0 in | Wt 163.0 lb

## 2023-12-15 DIAGNOSIS — K5900 Constipation, unspecified: Secondary | ICD-10-CM | POA: Diagnosis not present

## 2023-12-15 DIAGNOSIS — L209 Atopic dermatitis, unspecified: Secondary | ICD-10-CM | POA: Diagnosis not present

## 2023-12-15 DIAGNOSIS — R232 Flushing: Secondary | ICD-10-CM | POA: Insufficient documentation

## 2023-12-15 DIAGNOSIS — D709 Neutropenia, unspecified: Secondary | ICD-10-CM | POA: Diagnosis not present

## 2023-12-15 DIAGNOSIS — Z9089 Acquired absence of other organs: Secondary | ICD-10-CM | POA: Insufficient documentation

## 2023-12-15 DIAGNOSIS — Z1732 Human epidermal growth factor receptor 2 negative status: Secondary | ICD-10-CM | POA: Insufficient documentation

## 2023-12-15 DIAGNOSIS — Z79899 Other long term (current) drug therapy: Secondary | ICD-10-CM | POA: Insufficient documentation

## 2023-12-15 DIAGNOSIS — R5383 Other fatigue: Secondary | ICD-10-CM | POA: Insufficient documentation

## 2023-12-15 DIAGNOSIS — Z1722 Progesterone receptor negative status: Secondary | ICD-10-CM | POA: Diagnosis not present

## 2023-12-15 DIAGNOSIS — Z5112 Encounter for antineoplastic immunotherapy: Secondary | ICD-10-CM | POA: Diagnosis present

## 2023-12-15 DIAGNOSIS — R110 Nausea: Secondary | ICD-10-CM | POA: Diagnosis not present

## 2023-12-15 DIAGNOSIS — C773 Secondary and unspecified malignant neoplasm of axilla and upper limb lymph nodes: Secondary | ICD-10-CM | POA: Insufficient documentation

## 2023-12-15 DIAGNOSIS — Z882 Allergy status to sulfonamides status: Secondary | ICD-10-CM | POA: Diagnosis not present

## 2023-12-15 DIAGNOSIS — T451X5A Adverse effect of antineoplastic and immunosuppressive drugs, initial encounter: Secondary | ICD-10-CM | POA: Insufficient documentation

## 2023-12-15 DIAGNOSIS — Z5111 Encounter for antineoplastic chemotherapy: Secondary | ICD-10-CM | POA: Insufficient documentation

## 2023-12-15 DIAGNOSIS — B370 Candidal stomatitis: Secondary | ICD-10-CM | POA: Insufficient documentation

## 2023-12-15 DIAGNOSIS — E611 Iron deficiency: Secondary | ICD-10-CM | POA: Diagnosis not present

## 2023-12-15 DIAGNOSIS — C50411 Malignant neoplasm of upper-outer quadrant of right female breast: Secondary | ICD-10-CM | POA: Diagnosis present

## 2023-12-15 DIAGNOSIS — Z881 Allergy status to other antibiotic agents status: Secondary | ICD-10-CM | POA: Insufficient documentation

## 2023-12-15 DIAGNOSIS — Z171 Estrogen receptor negative status [ER-]: Secondary | ICD-10-CM

## 2023-12-15 DIAGNOSIS — D649 Anemia, unspecified: Secondary | ICD-10-CM | POA: Diagnosis not present

## 2023-12-15 DIAGNOSIS — D72829 Elevated white blood cell count, unspecified: Secondary | ICD-10-CM | POA: Diagnosis not present

## 2023-12-15 DIAGNOSIS — D0512 Intraductal carcinoma in situ of left breast: Secondary | ICD-10-CM

## 2023-12-15 DIAGNOSIS — Z888 Allergy status to other drugs, medicaments and biological substances status: Secondary | ICD-10-CM | POA: Insufficient documentation

## 2023-12-15 DIAGNOSIS — G629 Polyneuropathy, unspecified: Secondary | ICD-10-CM | POA: Diagnosis not present

## 2023-12-15 DIAGNOSIS — N6331 Unspecified lump in axillary tail of the right breast: Secondary | ICD-10-CM | POA: Diagnosis not present

## 2023-12-15 DIAGNOSIS — R400 Somnolence: Secondary | ICD-10-CM | POA: Diagnosis not present

## 2023-12-15 LAB — CBC WITH DIFFERENTIAL (CANCER CENTER ONLY)
Abs Immature Granulocytes: 0 K/uL (ref 0.00–0.07)
Basophils Absolute: 0 K/uL (ref 0.0–0.1)
Basophils Relative: 1 %
Eosinophils Absolute: 0 K/uL (ref 0.0–0.5)
Eosinophils Relative: 1 %
HCT: 33.4 % — ABNORMAL LOW (ref 36.0–46.0)
Hemoglobin: 11.1 g/dL — ABNORMAL LOW (ref 12.0–15.0)
Immature Granulocytes: 0 %
Lymphocytes Relative: 44 %
Lymphs Abs: 0.6 K/uL — ABNORMAL LOW (ref 0.7–4.0)
MCH: 31.7 pg (ref 26.0–34.0)
MCHC: 33.2 g/dL (ref 30.0–36.0)
MCV: 95.4 fL (ref 80.0–100.0)
Monocytes Absolute: 0.1 K/uL (ref 0.1–1.0)
Monocytes Relative: 7 %
Neutro Abs: 0.6 K/uL — ABNORMAL LOW (ref 1.7–7.7)
Neutrophils Relative %: 47 %
Platelet Count: 168 K/uL (ref 150–400)
RBC: 3.5 MIL/uL — ABNORMAL LOW (ref 3.87–5.11)
RDW: 13.7 % (ref 11.5–15.5)
WBC Count: 1.3 K/uL — ABNORMAL LOW (ref 4.0–10.5)
nRBC: 0 % (ref 0.0–0.2)

## 2023-12-15 LAB — CMP (CANCER CENTER ONLY)
ALT: 53 U/L — ABNORMAL HIGH (ref 0–44)
AST: 28 U/L (ref 15–41)
Albumin: 4.4 g/dL (ref 3.5–5.0)
Alkaline Phosphatase: 95 U/L (ref 38–126)
Anion gap: 6 (ref 5–15)
BUN: 13 mg/dL (ref 8–23)
CO2: 29 mmol/L (ref 22–32)
Calcium: 9.5 mg/dL (ref 8.9–10.3)
Chloride: 105 mmol/L (ref 98–111)
Creatinine: 0.52 mg/dL (ref 0.44–1.00)
GFR, Estimated: >60 mL/min (ref >60)
Glucose, Bld: 104 mg/dL — ABNORMAL HIGH (ref 70–99)
Potassium: 3.7 mmol/L (ref 3.5–5.1)
Sodium: 140 mmol/L (ref 135–145)
Total Bilirubin: 0.5 mg/dL (ref 0.0–1.2)
Total Protein: 7.1 g/dL (ref 6.5–8.1)

## 2023-12-15 MED ORDER — HYDROCODONE-ACETAMINOPHEN 5-325 MG PO TABS: 1.0000 | ORAL_TABLET | Freq: Four times a day (QID) | ORAL | 0 refills | Status: DC | PRN

## 2023-12-16 ENCOUNTER — Other Ambulatory Visit: Payer: Self-pay | Admitting: Hematology

## 2023-12-16 ENCOUNTER — Inpatient Hospital Stay

## 2023-12-16 ENCOUNTER — Other Ambulatory Visit (HOSPITAL_COMMUNITY): Payer: Self-pay

## 2023-12-16 ENCOUNTER — Encounter: Payer: Self-pay | Admitting: Hematology

## 2023-12-16 ENCOUNTER — Other Ambulatory Visit: Payer: Self-pay

## 2023-12-16 VITALS — BP 127/70 | HR 99 | Temp 98.2°F | Resp 12 | Ht 72.0 in | Wt 172.0 lb

## 2023-12-16 DIAGNOSIS — Z171 Estrogen receptor negative status [ER-]: Secondary | ICD-10-CM

## 2023-12-16 DIAGNOSIS — Z5112 Encounter for antineoplastic immunotherapy: Secondary | ICD-10-CM | POA: Diagnosis not present

## 2023-12-16 MED ORDER — PACLITAXEL PROTEIN-BOUND CHEMO INJECTION 100 MG
80.0000 mg/m2 | Freq: Once | INTRAVENOUS | Status: AC
  Administered 2023-12-16: 150 mg via INTRAVENOUS
  Filled 2023-12-16: qty 30

## 2023-12-16 MED ORDER — SODIUM CHLORIDE 0.9 % IV SOLN: INTRAVENOUS | Status: DC

## 2023-12-16 MED ORDER — PROCHLORPERAZINE MALEATE 10 MG PO TABS
10.0000 mg | ORAL_TABLET | Freq: Once | ORAL | Status: AC
  Administered 2023-12-16: 10 mg via ORAL
  Filled 2023-12-16: qty 1

## 2023-12-16 NOTE — Patient Instructions (Signed)
 [facility] - A DEPT OF [facility]  Discharge Instructions: Thank you for choosing  Cancer Center to provide your oncology and hematology care.   If you have a lab appointment with the Cancer Center, please go directly to the Cancer Center and check in at the registration area.   Wear comfortable clothing and clothing appropriate for easy access to any Portacath or PICC line.   We strive to give you quality time with your provider. You may need to reschedule your appointment if you arrive late (15 or more minutes).  Arriving late affects you and other patients whose appointments are after yours.  Also, if you miss three or more appointments without notifying the office, you may be dismissed from the clinic at the provider's discretion.      For prescription refill requests, have your pharmacy contact our office and allow 72 hours for refills to be completed.    Today you received the following chemotherapy and/or immunotherapy agents: Abraxane       To help prevent nausea and vomiting after your treatment, we encourage you to take your nausea medication as directed.  BELOW ARE SYMPTOMS THAT SHOULD BE REPORTED IMMEDIATELY: *FEVER GREATER THAN 100.4 F (38 C) OR HIGHER *CHILLS OR SWEATING *NAUSEA AND VOMITING THAT IS NOT CONTROLLED WITH YOUR NAUSEA MEDICATION *UNUSUAL SHORTNESS OF BREATH *UNUSUAL BRUISING OR BLEEDING *URINARY PROBLEMS (pain or burning when urinating, or frequent urination) *BOWEL PROBLEMS (unusual diarrhea, constipation, pain near the anus) TENDERNESS IN MOUTH AND THROAT WITH OR WITHOUT PRESENCE OF ULCERS (sore throat, sores in mouth, or a toothache) UNUSUAL RASH, SWELLING OR PAIN  UNUSUAL VAGINAL DISCHARGE OR ITCHING   Items with * indicate a potential emergency and should be followed up as soon as possible or go to the Emergency Department if any problems should occur.  Please show the CHEMOTHERAPY ALERT CARD or IMMUNOTHERAPY  ALERT CARD at check-in to the Emergency Department and triage nurse.  Should you have questions after your visit or need to cancel or reschedule your appointment, please contact [facility] - A DEPT OF [facility]  Dept: [phone]  and follow the prompts.  Office hours are 8:00 a.m. to 4:30 p.m. Monday - Friday. Please note that voicemails left after 4:00 p.m. may not be returned until the following business day.  We are closed weekends and major holidays. You have access to a nurse at all times for urgent questions. Please call the main number to the clinic Dept: [phone] and follow the prompts.   For any non-urgent questions, you may also contact your provider using MyChart. We now offer e-Visits for anyone 66 and older to request care online for non-urgent symptoms. For details visit [email or web address].   Also download the MyChart app! Go to the app store, search MyChart, open the app, select [place], and log in with your MyChart username and password.  Paclitaxel  Nanoparticle Albumin-Bound Injection What is this medication? NANOPARTICLE ALBUMIN-BOUND PACLITAXEL  (Na no PAHR ti kuhl al BYOO muhn-bound PAK li TAX el) treats some types of cancer. It works by slowing down the growth of cancer cells. This medicine may be used for other purposes; ask your health care provider or pharmacist if you have questions. COMMON BRAND NAME(S): Abraxane  What should I tell my care team before I take this medication? They need to know if you have any of these conditions: Liver disease Low white blood cell levels An  unusual or allergic reaction to paclitaxel , albumin, other medications, foods, dyes, or preservatives If you or your partner are pregnant or trying to get pregnant Breast-feeding How should I use this medication? This medication is injected into a vein. It is given by your care team in a hospital or clinic setting. Talk to your care team about the  use of this medication in children. Special care may be needed. Overdosage: If you think you have taken too much of this medicine contact a poison control center or emergency room at once. NOTE: This medicine is only for you. Do not share this medicine with others. What if I miss a dose? Keep appointments for follow-up doses. It is important not to miss your dose. Call your care team if you are unable to keep an appointment. What may interact with this medication? Other medications may affect the way this medication works. Talk with your care team about all of the medications you take. They may suggest changes to your treatment plan to lower the risk of side effects and to make sure your medications work as intended. This list may not describe all possible interactions. Give your health care provider a list of all the medicines, herbs, non-prescription drugs, or dietary supplements you use. Also tell them if you smoke, drink alcohol, or use illegal drugs. Some items may interact with your medicine. What should I watch for while using this medication? Your condition will be monitored carefully while you are receiving this medication. You may need blood work while taking this medication. This medication may make you feel generally unwell. This is not uncommon as chemotherapy can affect healthy cells as well as cancer cells. Report any side effects. Continue your course of treatment even though you feel ill unless your care team tells you to stop. This medication can cause serious allergic reactions. To reduce the risk, your care team may give you other medications to take before receiving this one. Be sure to follow the directions from your care team. This medication may increase your risk of getting an infection. Call your care team for advice if you get a fever, chills, sore throat, or other symptoms of a cold or flu. Do not treat yourself. Try to avoid being around people who are sick. This medication  may increase your risk to bruise or bleed. Call your care team if you notice any unusual bleeding. Be careful brushing or flossing your teeth or using a toothpick because you may get an infection or bleed more easily. If you have any dental work done, tell your dentist you are receiving this medication. Talk to your care team if you or your partner may be pregnant. Serious birth defects can occur if you take this medication during pregnancy and for 6 months after the last dose. You will need a negative pregnancy test before starting this medication. Contraception is recommended while taking this medication and for 6 months after the last dose. Your care team can help you find the option that works for you. If your partner can get pregnant, use a condom during sex while taking this medication and for 3 months after the last dose. Do not breastfeed while taking this medication and for 2 weeks after the last dose. This medication may cause infertility. Talk to your care team if you are concerned about your fertility. What side effects may I notice from receiving this medication? Side effects that you should report to your care team as soon as possible: Allergic reactions--skin  rash, itching, hives, swelling of the face, lips, tongue, or throat Dry cough, shortness of breath or trouble breathing Infection--fever, chills, cough, sore throat, wounds that don't heal, pain or trouble when passing urine, general feeling of discomfort or being unwell Low red blood cell level--unusual weakness or fatigue, dizziness, headache, trouble breathing Pain, tingling, or numbness in the hands or feet Stomach pain, unusual weakness or fatigue, nausea, vomiting, diarrhea, or fever that lasts longer than expected Unusual bruising or bleeding Side effects that usually do not require medical attention (report to your care team if they continue or are bothersome): Diarrhea Fatigue Hair loss Loss of  appetite Nausea Vomiting This list may not describe all possible side effects. Call your doctor for medical advice about side effects. You may report side effects to FDA at 1-800-FDA-1088. Where should I keep my medication? This medication is given in a hospital or clinic. It will not be stored at home. NOTE: This sheet is a summary. It may not cover all possible information. If you have questions about this medicine, talk to your doctor, pharmacist, or health care provider.  2024 Elsevier/Gold Standard (2021-08-15 00:00:00)  Neutropenia Neutropenia is a condition that occurs when you have low levels of neutrophils. Neutrophils are a type of white blood cells. They are made in the spongy center of bones (bone marrow). They fight infections. Neutrophils are your body's main defense against infections. The fewer neutrophils you have and the longer your body remains without them, the greater your risk of getting a severe infection. What are the causes? This condition can occur if your body uses up or destroys neutrophils faster than your bone marrow can make them. Neutropenia may be caused by: A bacterial or fungal infection. Allergic disorders. Reactions to some medicines. An autoimmune disease. An enlarged spleen. This condition can also occur if your bone marrow does not produce enough neutrophils. This problem may be caused by: Cancer. Cancer treatments, such as radiation or chemotherapy. Viral infections. Medicines, such as phenytoin. Vitamin B12 deficiency. Diseases of the bone marrow. Environmental toxins, such as insecticides. What are the signs or symptoms? This condition does not usually cause symptoms. If symptoms are present, they are usually caused by an underlying infection. Symptoms of an infection may include: Fever. Chills. Swollen glands. Mouth ulcers. Cough. Rash or skin infection. Skin may be red, swollen, or painful. Abdominal or rectal pain. Frequent urination  or pain or burning with urination. Because neutropenia weakens the immune system, symptoms of infection may be reduced. It is important to be aware of any changes in your body and talk to your health care provider. How is this diagnosed? This condition is diagnosed based on your medical history and a physical exam. Tests will also be done, such as: A complete blood count (CBC). Bone marrow biopsy. This is collecting a sample of bone marrow for testing. A chest X-ray. A urine culture. A blood culture. How is this treated? Treatment depends on the underlying cause and severity of your condition. Mild neutropenia may not require treatment. Treatment may include medicines, such as: Antibiotic medicine given through an IV. Antiviral medicines. Antifungal medicines. A medicine to increase production of neutrophils (colony-stimulating factor). You may get this medicine through an IV or by injection. Steroids given through an IV. If an underlying condition is causing neutropenia, you may need treatment for that condition. If medicines or cancer treatments are causing neutropenia, your health care provider may have you stop the medicines or treatment. Follow these instructions at  home: Medicines  Take over-the-counter and prescription medicines only as told by your health care provider. Get an annual flu shot. Ask your health care provider whether you or anyone you live with needs any other vaccines. Eating and drinking Do not share food utensils. Do not eat unpasteurized foods. Do not eat raw or undercooked meat, eggs, or seafood. Do not eat unwashed, raw fruits or vegetables. Lifestyle Avoid exposure to groups of people or children. Avoid being around people who are sick. Avoid being around live plants or fresh flowers. Avoid being around dirt or dust, such as in construction areas or gardens. Wear gloves if you are going to do yard work or gardening. Do not provide direct care for pets.  Avoid animal droppings. Do not clean litter boxes and bird cages. Do not have sex unless your health care provider has approved. Hygiene  Bathe daily. Clean the area between the genitals and the anus (perineal area) after you urinate or have a bowel movement. If you are female, wipe from front to back. Get regular dental care and brush your teeth with a soft toothbrush before and after meals. Do not use a regular razor. Use an electric razor to remove hair. Wash your hands often with soap and water for at least 20 seconds. Make sure others who come in contact with you also wash their hands. If soap and water are not available, use hand sanitizer. General instructions Take steps to reduce your risk of injury or infection. Follow any precautions as told by your health care provider. Take actions to avoid cuts and burns. For example: Be cautious when you use knives. Always cut away from yourself. Keep knives in protective sheaths or guards when not in use. Use oven mitts when you cook with a hot stove, oven, or grill. Stand a safe distance away from open fires. Do not use tampons, enemas, or rectal suppositories unless your health care provider has approved. Keep all follow-up visits. This is important. Contact a health care provider if: You have a cough. You have a sore throat. You develop sores in your mouth or anus. You have a warm, red, or tender area on your skin. You have red streaks on the skin. You develop a rash. You have swollen lymph nodes. You have frequent or painful urination. You have vaginal discharge or itching. Get help right away if: You have a fever. You have chills or shaking. You have nausea or vomiting. You have a lot of fatigue. You have shortness of breath. Summary Neutropenia is a condition that occurs when you have a lower-than-normal level of a type of white blood cell (neutrophils) in your body. This condition can occur if your body uses up or destroys  neutrophils faster than your bone marrow can make them. Treatment depends on the underlying cause and severity of your condition. Mild neutropenia may not require treatment. Follow any precautions as told by your health care provider to reduce your risk for injury or infection. This information is not intended to replace advice given to you by your health care provider. Make sure you discuss any questions you have with your health care provider. Document Revised: 09/19/2020 Document Reviewed: 09/26/2020 Elsevier Patient Education  2025 [employer].

## 2023-12-17 ENCOUNTER — Other Ambulatory Visit (HOSPITAL_COMMUNITY): Payer: Self-pay

## 2023-12-17 ENCOUNTER — Telehealth: Payer: Self-pay | Admitting: Hematology

## 2023-12-17 ENCOUNTER — Telehealth: Payer: Self-pay

## 2023-12-17 ENCOUNTER — Inpatient Hospital Stay

## 2023-12-17 VITALS — BP 119/63 | HR 91 | Temp 98.2°F | Resp 16

## 2023-12-17 DIAGNOSIS — Z171 Estrogen receptor negative status [ER-]: Secondary | ICD-10-CM

## 2023-12-17 DIAGNOSIS — Z5112 Encounter for antineoplastic immunotherapy: Secondary | ICD-10-CM | POA: Diagnosis not present

## 2023-12-17 MED ORDER — FILGRASTIM-SNDZ 480 MCG/0.8ML IJ SOSY
480.0000 ug | PREFILLED_SYRINGE | Freq: Once | INTRAMUSCULAR | Status: AC
  Administered 2023-12-17: 480 ug via SUBCUTANEOUS
  Filled 2023-12-17: qty 0.8

## 2023-12-17 NOTE — Telephone Encounter (Signed)
 LM for patient that this nurse was calling to see how they were doing after their treatment. Please call back to Dr. Latanya Maudlin nurse at [phone] if they have any questions or concerns regarding the treatment.

## 2023-12-17 NOTE — Telephone Encounter (Signed)
 Dr. Lanny, first time Abraxane , f/u call.  Pt tolerated well Received: Nilsa Metro Katrinka DELENA, RN  P Onc Triage Nurse [facility] Caller: Unspecified (Yesterday, 10:22 AM)

## 2023-12-17 NOTE — Telephone Encounter (Signed)
 Called pt and  she aware of  her appts.

## 2023-12-18 ENCOUNTER — Other Ambulatory Visit: Payer: Self-pay

## 2023-12-18 ENCOUNTER — Inpatient Hospital Stay

## 2023-12-18 VITALS — BP 124/69 | HR 98 | Temp 98.9°F | Resp 19

## 2023-12-18 DIAGNOSIS — Z5112 Encounter for antineoplastic immunotherapy: Secondary | ICD-10-CM | POA: Diagnosis not present

## 2023-12-18 DIAGNOSIS — C50411 Malignant neoplasm of upper-outer quadrant of right female breast: Secondary | ICD-10-CM

## 2023-12-18 MED ORDER — FILGRASTIM-SNDZ 480 MCG/0.8ML IJ SOSY
480.0000 ug | PREFILLED_SYRINGE | Freq: Once | INTRAMUSCULAR | Status: AC
  Administered 2023-12-18: 480 ug via SUBCUTANEOUS

## 2023-12-19 ENCOUNTER — Inpatient Hospital Stay

## 2023-12-19 VITALS — BP 118/67 | HR 114 | Temp 97.7°F | Resp 17

## 2023-12-19 DIAGNOSIS — C50411 Malignant neoplasm of upper-outer quadrant of right female breast: Secondary | ICD-10-CM

## 2023-12-19 DIAGNOSIS — Z5112 Encounter for antineoplastic immunotherapy: Secondary | ICD-10-CM | POA: Diagnosis not present

## 2023-12-19 MED ORDER — FILGRASTIM-SNDZ 480 MCG/0.8ML IJ SOSY
480.0000 ug | PREFILLED_SYRINGE | Freq: Once | INTRAMUSCULAR | Status: AC
  Administered 2023-12-19: 480 ug via SUBCUTANEOUS
  Filled 2023-12-19: qty 0.8

## 2023-12-20 IMAGING — DX DG KNEE AP/LAT W/ SUNRISE*L*
3 series · 3 of 3 positions shown · non-contrast
Comparison: None.

CLINICAL DATA: Left posterolateral knee pain, status post injury 4
weeks ago.

EXAM:
LEFT KNEE 3 VIEWS

[dg knee ap/lat w/ sunrise left (1 of 3)]
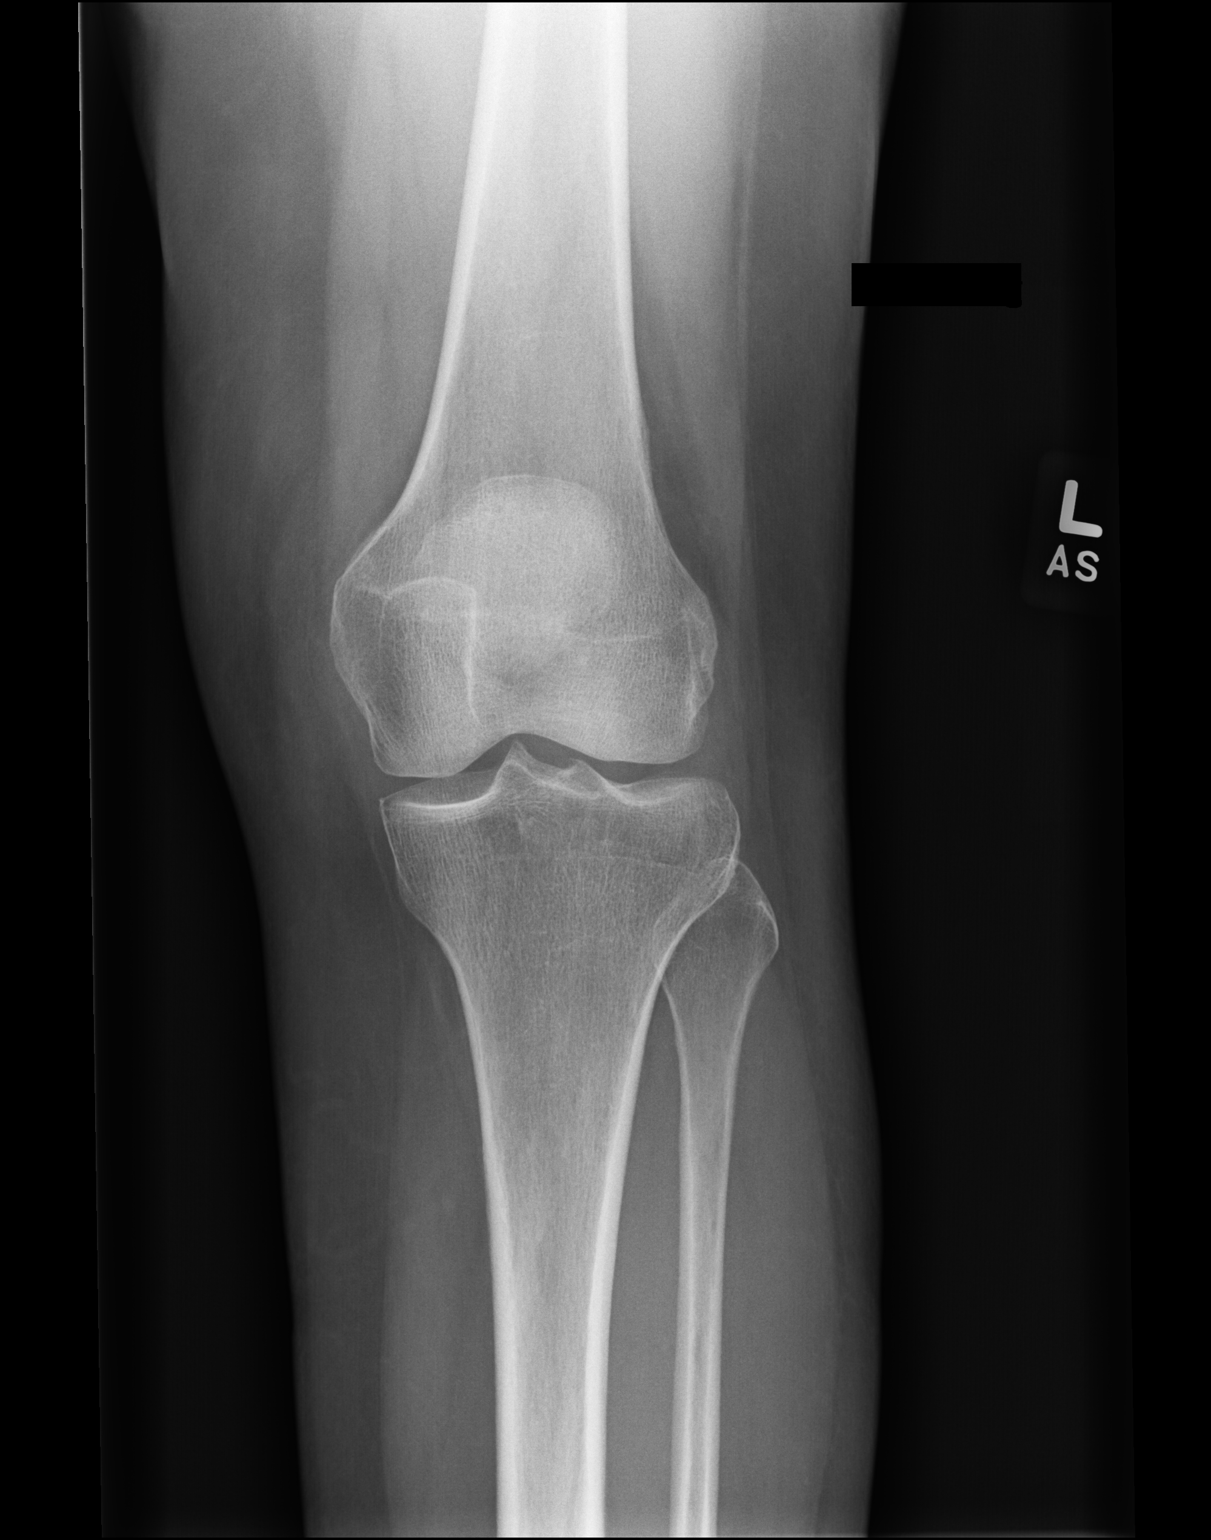

[dg knee ap/lat w/ sunrise left (2 of 3)]
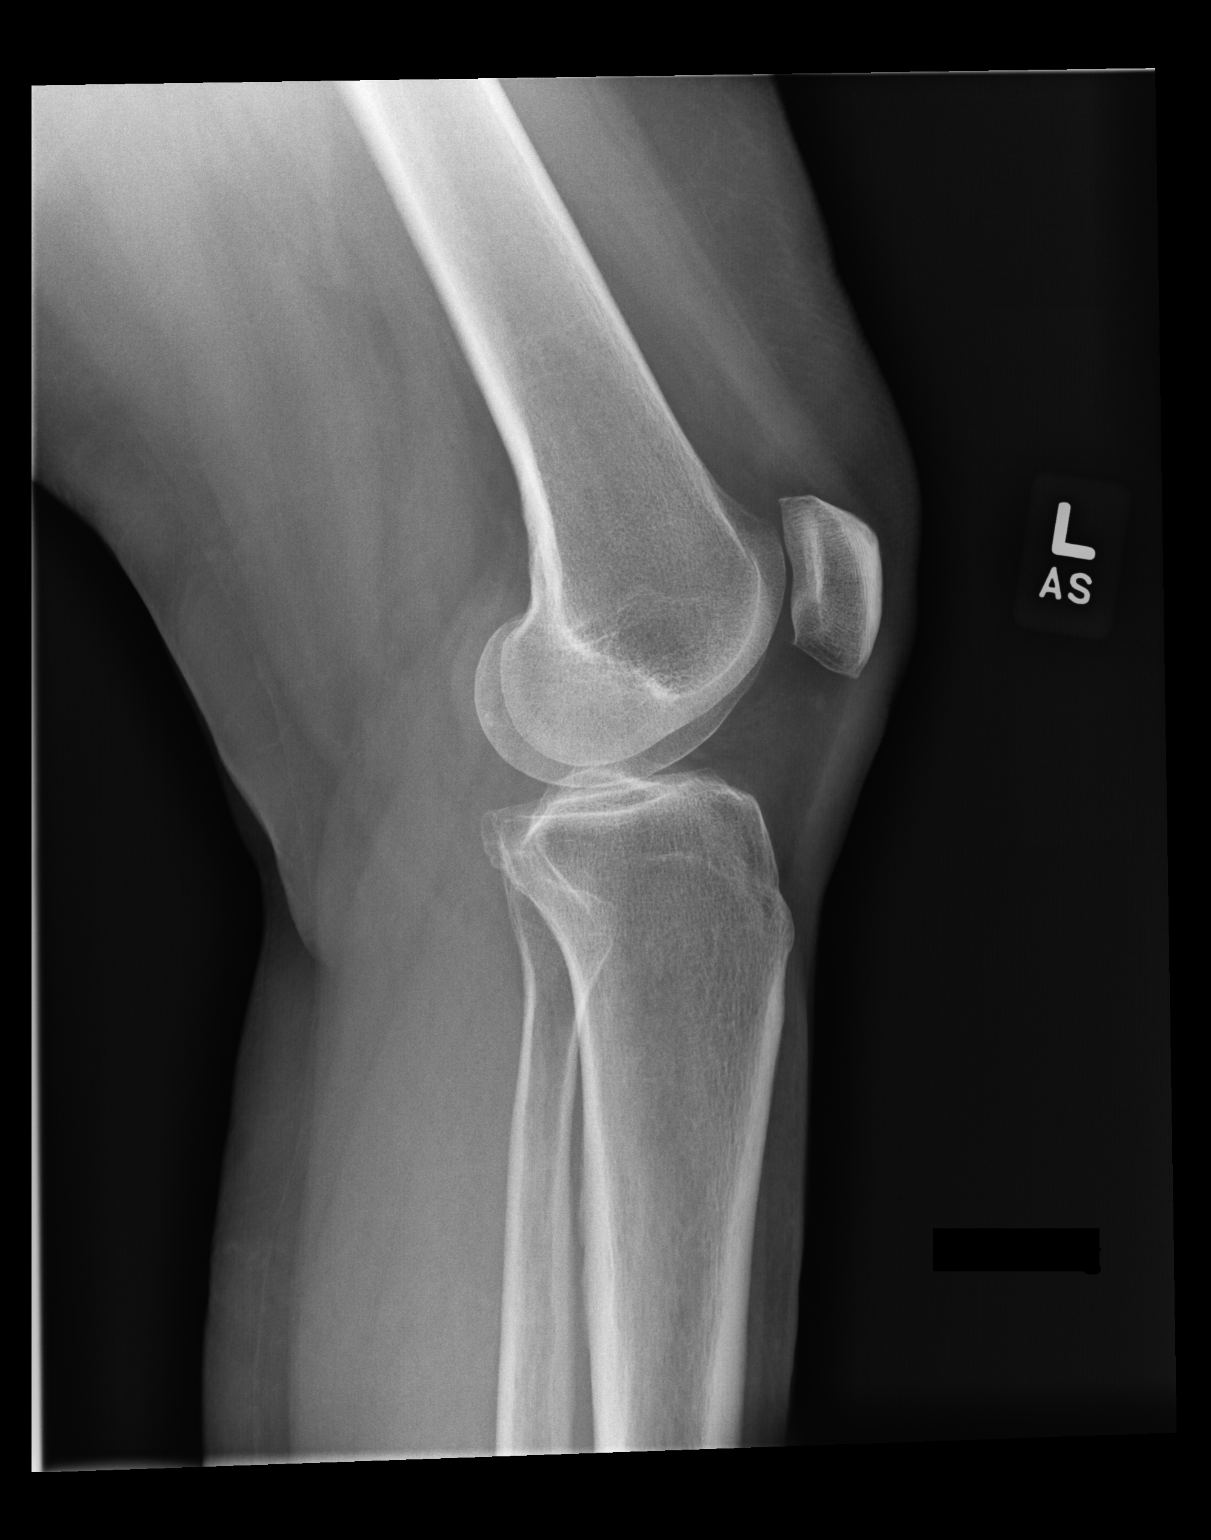

[dg knee ap/lat w/ sunrise left (3 of 3)]
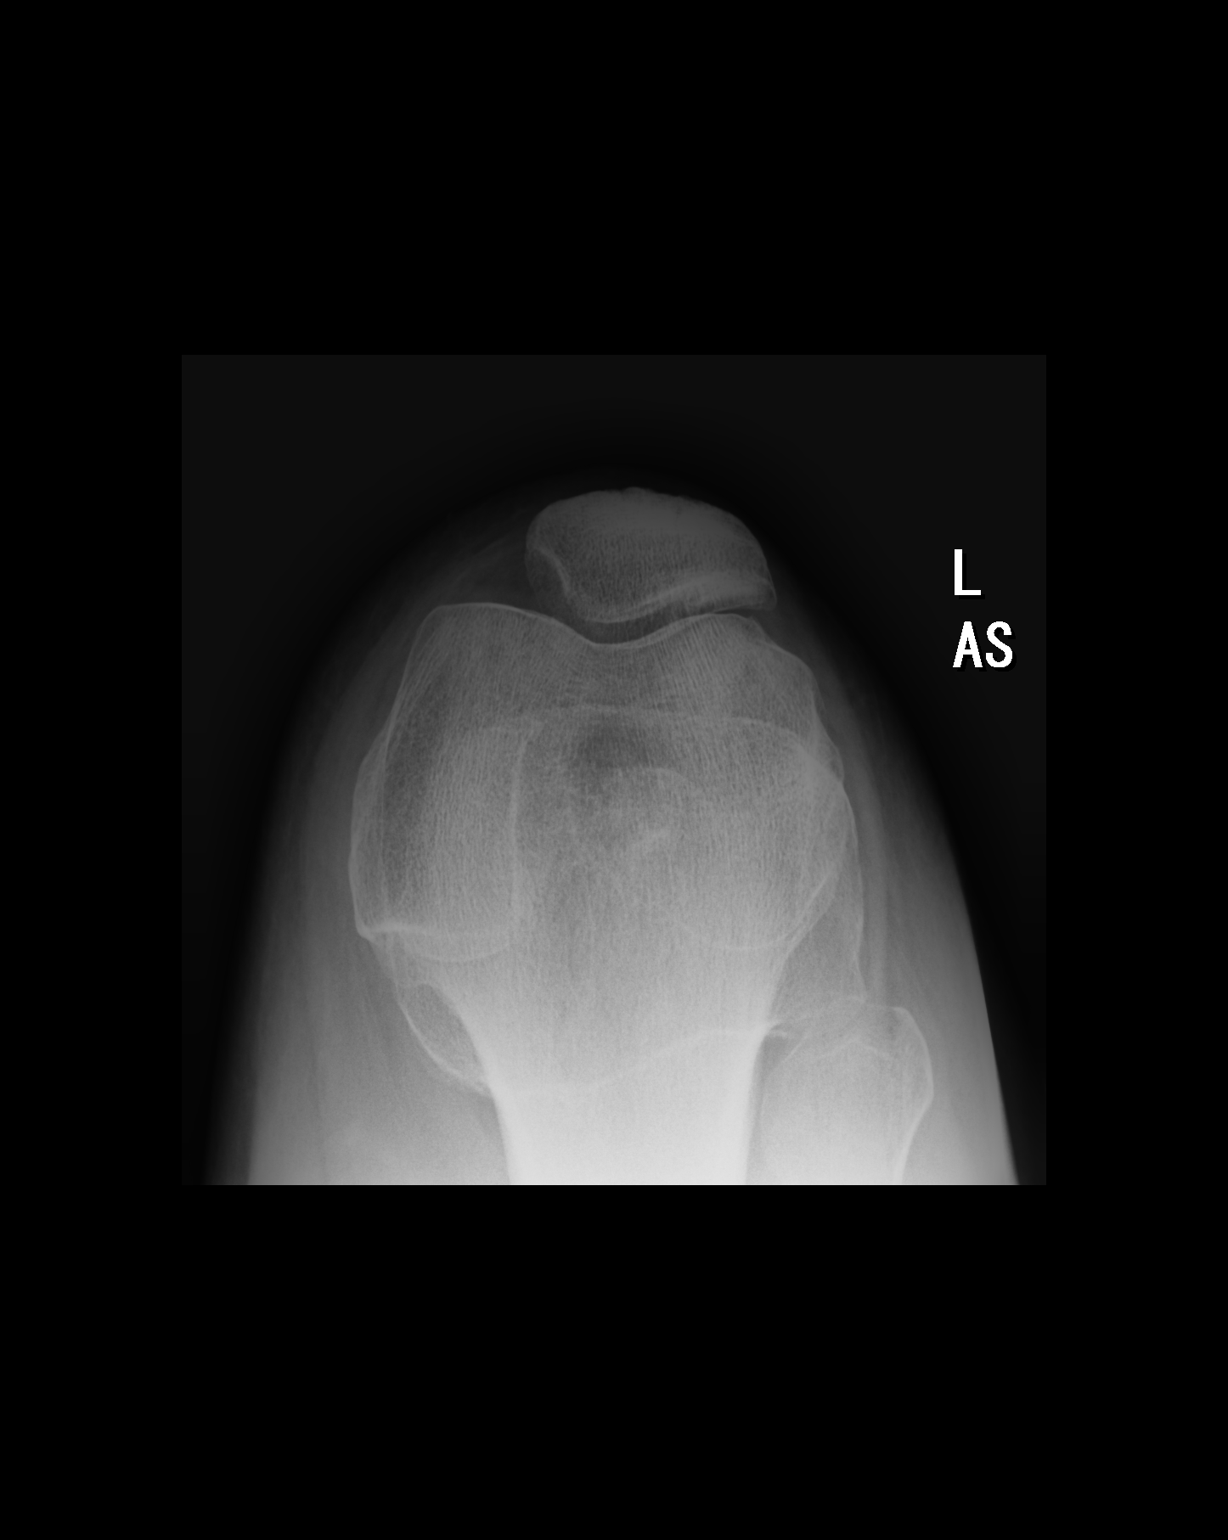

[3 of 3 positions shown; findings below may reference images not displayed]

FINDINGS: Normal bone mineralization. Minimal superior and inferior and mild
lateral patellar degenerative osteophytosis. Minimal patellofemoral
joint space narrowing. No joint effusion. No acute fracture or
dislocation.
IMPRESSION: Mild patellofemoral osteoarthritis.

## 2023-12-21 ENCOUNTER — Encounter: Payer: Self-pay | Admitting: Nurse Practitioner

## 2023-12-23 NOTE — Progress Notes (Unsigned)
 Patient Care Team: [name], MD as PCP - General (Internal Medicine) [name], RN as Oncology Nurse Navigator [name], MD as Consulting Physician (General Surgery) [name], MD as Consulting Physician (Hematology) [name], MD as Consulting Physician (Radiation Oncology) [name], MD as Consulting Physician (Endocrinology) [name], MD as Consulting Physician (Sports Medicine) [name], MD as Consulting Physician (Obstetrics and Gynecology) [name], [name], NP as Nurse Practitioner (Nurse Practitioner) Imaging, [facility] as Radiologist (Diagnostic Radiology)  Clinic Day:  12/23/2023  Referring physician: [name], MD  ASSESSMENT & PLAN:   Assessment & Plan: Malignant neoplasm of upper-outer quadrant of right breast in female, estrogen receptor negative (HCC) IDC and DCIS, Stage IA, pT1b, cN0, triple negative, Grade 3, chest wall recurrence in 10/2023  -found on screening mammogram. S/p lumpectomy on 01/08/22 with Dr. Aron, path showed: 8 mm invasive and in situ ductal carcinoma with negative margins. Repeat prognostic panel confirmed triple negative disease.  -lymph node biopsies 01/28/22 showed negative nodes (0/2). Port placed at time of procedure. Postoperative course complicated by pneumothorax, which has resolved. -she began adjuvant TC on 02/20/22, and completed planned 4 cycles on 04/24/2022. -she completed adjuvant RT on 07/09/2022  -biopsy confirmed chest lymph nodes (axilla and chest wall including posteriad pectoralis muscle)  recurrence in 10/2023 - Case reviewed in breast tumor board, plan to start neoadjuvant chemotherapy with carboplatin  and paclitaxel  every 3 weeks for 4 cycles, followed by AC every 3 weeks for 4 cycles, along with immunotherapy Keytruda  for 1 year.  If she has good response to treatment, Dr. Aron will consider surgery although she is not able to remove the aubpectoral lymph node.  She may  need adjuvant radiation after surgery. -currently on weekly Taxol  and Keytruda  every 3 weeks. Has developed rash, anemia, and leukocytosis. Changed to Abraxane  (protein bound paclitaxol) with Cycle 3. Will receive G-CSF for 3 days.  --12/15/2023 - worsening neutropenia. Will proceed with treatment 12/16/2023 with abraxane  and carboplatin . She will receive Zarzio injections 12/17/2023, 12/18/2023, and 12/19/2023.  -12/23/2023 -she has tolerated change from paclitaxel  to Abraxane  well with dry, rough skin present, and increased nausea on day of chemotherapy.  ANC count has recovered.  Will proceed with cycle 3 day 1 of chemotherapy with carboplatin  and Abraxane  and immunotherapy Keytruda .    Neutropenia Improved.  WBC remains low at 2.9.  ANC has recovered to 1.7.  Likely related to chemotherapy.  She is scheduled to receive Zarzio again with current cycle of chemotherapy.  Scheduled dates are 9/27, 9/28, and 9/29.  Anemia Mild and stable anemia with Hgb 10.4 and HCT 31.0.  Likely related to chemotherapy.  Will continue to monitor with every visit.  Treat with RBCs or iron infusion as indicated.  Abnormal thyroid  function TSH gradually beginning to rise.  Expected side effect from treatment.  TSH 8.020 today.  T4 within normal limits.  Will continue to monitor and add levothyroxine  as indicated for T4 levels dropping below normal threshold.  Rash Atopic dermatitis/dry skin on upper and mid back.  Persistent red rash on arms and lower legs.  Some areas showing some mild improvement.  Recommend she continue to use clobetasol  cream as previously prescribed.  Will continue with Abraxane  over paclitaxel .  Consider referral to dermatology if rash worsens or new areas  Plan Labs reviewed. -Mild and stable anemia. -Improved neutropenia.  Will receive G-CSF support on 9/27, 9/28, 9/29. -TSH starting to rise with T4 remaining within normal limits.  Continue to monitor closely and treat with levothyroxine  if T4 drops  below normal limits.  Refer to endocrinology as indicated. Send patient presentation are appropriate for treatment today. - Proceed with cycle 3 day 1 of neoadjuvant chemotherapy carboplatin  and Abraxane  along with immunotherapy Keytruda . Labs/flush, follow-up, and cycle 3 day 8 neoadjuvant chemotherapy as scheduled.  The patient understands the plans discussed today and is in agreement with them.  She knows to contact our office if she develops concerns prior to her next appointment.  I provided 25 minutes of face-to-face time during this encounter and > 50% was spent counseling as documented under my assessment and plan.    Powell FORBES Lessen, NP  [facility] - A DEPT OF [facility] [address] [place] [address] Dept: [phone] Dept Fax: [phone]   No orders of the defined types were placed in this encounter.     CHIEF COMPLAINT:  CC: Right breast cancer, ER negative  Current Treatment: Neoadjuvant chemotherapy carboplatin  and Abraxane , and immunotherapy Keytruda   INTERVAL HISTORY:  Samantha [name] is here today for repeat clinical assessment.  She last saw me on 12/15/2023.  Was treated for neutropenia with Zarzio 3 days last week.  She had also developed rash, likely related to Taxol .  Taxol  changed to Abraxane .  She has noticed eruption of new bumps, not itchy.  These are present on her back, some on her legs.  Today, she presents for cycle 3 day 1 neoadjuvant chemotherapy carboplatin  and Abraxane , and immunotherapy Keytruda .  She has noticed some fine, raised bumps on her upper and mid back.  Not itchy.  Areas of patchy dry skin in the same general area.  Previously noted rash has started to lighten in on her arms and hands.  Persistent on lower legs.  Does not itch.  Improved nodularity noted.  She denies chest pain, chest pressure, or shortness of breath. She denies headaches or visual disturbances. She denies  abdominal pain, nausea, vomiting, or changes in bowel or bladder habits.  She denies fevers or chills. She denies pain. Her appetite is fair.  She states that many foods are just not tasting good to her at this point.  She has noticed some increased nausea chemotherapy, but has not had vomiting.  Her weight has decreased 7 pounds over last 2 weeks.  I have reviewed the past medical history, past surgical history, social history and family history with the patient and they are unchanged from previous note.  ALLERGIES:  is allergic to nortriptyline , singulair  [montelukast  sodium], sulfonamide derivatives, amitriptyline  hcl, esomeprazole, fluticasone , gabapentin , lexapro [escitalopram], neomycin, paclitaxel , pseudoephedrine, and neosporin [bacitracin-polymyxin b].  MEDICATIONS:  Current Outpatient Medications  Medication Sig Dispense Refill   acetaminophen  (TYLENOL ) 325 MG tablet Take 650 mg by mouth every 6 (six) hours as needed.     ALPRAZolam (XANAX) 0.5 MG tablet Take 0.5 mg by mouth at bedtime as needed for anxiety.     atorvastatin (LIPITOR) 20 MG tablet Take 20 mg by mouth every evening.     betamethasone dipropionate 0.05 % cream Apply 1 Application topically 2 (two) times daily as needed (skin irritation.).     Calcium  Carb-Cholecalciferol (CALCIUM -VITAMIN D3) 250-125 MG-UNIT TABS Take 1 tablet by mouth daily with lunch.     Cholecalciferol (VITAMIN D3) 50 MCG (2000 UT) TABS Take 2,000 Units by mouth daily with lunch.     Clobetasol  Prop Emollient Base 0.05 % emollient cream Apply 1  Application topically 2 (two) times daily. 60 g 1   ezetimibe (ZETIA) 10 MG tablet Take 10 mg by mouth every evening.     famotidine  (PEPCID ) 20 MG tablet Take 1 tablet (20 mg total) by mouth daily. (Patient taking differently: Take 20 mg by mouth daily as needed for heartburn or indigestion.)     Fezolinetant (VEOZAH) 45 MG TABS Take 45 mg by mouth in the morning. (Patient not taking: Reported on 11/26/2023)      HYDROcodone -acetaminophen  (NORCO/VICODIN) 5-325 MG tablet Take 1 tablet by mouth every 6 (six) hours as needed for moderate pain (pain score 4-6). 15 tablet 0   lidocaine -prilocaine  (EMLA ) cream Apply to affected area once 30 g 3   loperamide (IMODIUM) 2 MG capsule Take by mouth as needed for diarrhea or loose stools.     magic mouthwash (nystatin , diphenhydrAMINE , alum & mag hydroxide) suspension mixture Swish and spit 5 mLs 4 (four) times daily as needed for mouth pain. 140 mL 1   nystatin -triamcinolone (MYCOLOG II) cream Apply 1 Application topically 2 (two) times daily as needed (irritation).     ondansetron  (ZOFRAN ) 8 MG tablet Take 1 tablet (8 mg total) by mouth every 8 (eight) hours as needed for nausea or vomiting. Start on the third day after chemotherapy. 30 tablet 1   pantoprazole  (PROTONIX ) 40 MG tablet Take 1 tablet (40 mg total) by mouth 2 (two) times daily. 60 tablet 2   prochlorperazine  (COMPAZINE ) 10 MG tablet Take 1 tablet (10 mg total) by mouth every 6 (six) hours as needed for nausea or vomiting. 30 tablet 1   sertraline  (ZOLOFT ) 50 MG tablet Take 50 mg by mouth in the morning.     tacrolimus (PROTOPIC) 0.03 % ointment Apply 1 Application topically 2 (two) times daily as needed (skin irritation.).     TIROSINT  100 MCG CAPS Take 100 mcg by mouth See admin instructions. Take 1 capsule (100 mcg) by mouth every 3 days, alternating with (Tirosint  88 mcg)--take on an empty stomach.     TIROSINT  88 MCG CAPS Take 88 mcg by mouth See admin instructions. Take 1 capsule (88 mcg) by mouth 2 days in a row, alternating with Tirosint  (100 mcg) on the 3 rd day.     triamcinolone ointment (KENALOG) 0.1 % Apply 1 Application topically 2 (two) times daily as needed (skin irritation.).     valACYclovir (VALTREX) 500 MG tablet Take 500 mg by mouth 2 (two) times daily as needed (cold sore/fever blisters).     No current facility-administered medications for this visit.    HISTORY OF PRESENT ILLNESS:    Oncology History Overview Note   Cancer Staging  Malignant neoplasm of upper-outer quadrant of right breast in female, estrogen receptor negative Staging form: Breast, AJCC 8th Edition - Clinical stage from 12/23/2021: cT1b, cM0, G3, ER-, PR-, HER2- - Unsigned Stage prefix: Initial diagnosis Histologic grading system: 3 grade system - Pathologic stage from 01/08/2022: Stage IB (pT1b, pN0, cM0, G3, ER-, PR-, HER2-) - Signed by [name], MD on 01/24/2022 Stage prefix: Initial diagnosis Histologic grading system: 3 grade system Residual tumor (R): R0 - None     Malignant neoplasm of upper-outer quadrant of right breast in female, estrogen receptor negative (HCC)  12/06/2021 Mammogram   CLINICAL DATA:  Patient returns today to evaluate RIGHT breast calcifications identified on a recent screening mammogram.   EXAM: DIGITAL DIAGNOSTIC UNILATERAL RIGHT MAMMOGRAM  IMPRESSION: Grouped coarse heterogeneous calcifications within the upper-outer quadrant of the RIGHT breast, measuring  6 mm extent. These may be fibroadenomatous calcifications. Stereotactic biopsy is recommended to exclude malignancy.   12/18/2021 Initial Biopsy   Diagnosis Breast, right, needle core biopsy, upper outer quadrant, x clip - DUCTAL CARCINOMA IN SITU, SOLID AND CRIBRIFORM TYPE WITH COMEDONECROSIS AND ASSOCIATED CALCIFICATIONS, NUCLEAR GRADE 3 OF 3 - FOCAL MICROINVASION IS PRESENT (LESS THAN 1 MM) WITH EVIDENCE OF LYMPHOVASCULAR INVASION - NECROSIS: PRESENT - CALCIFICATIONS: PRESENT - DCIS LENGTH: 7 MM IN GREATEST LINEAR DIMENSION ON FRAGMENTED CORES  PROGNOSTIC INDICATORS Results: IMMUNOHISTOCHEMICAL AND MORPHOMETRIC ANALYSIS PERFORMED MANUALLY The tumor cells are NEGATIVE for Her2 (1+). Estrogen Receptor: 0%, NEGATIVE Progesterone Receptor: 0%, NEGATIVE Proliferation Marker Ki67: 60%   12/23/2021 Initial Diagnosis   Malignant neoplasm of upper-outer quadrant of right breast in female, estrogen receptor  negative (HCC)   12/23/2021 Imaging   EXAM: ULTRASOUND OF THE RIGHT AXILLA  IMPRESSION: No abnormal appearing RIGHT axillary lymph nodes.   01/08/2022 Cancer Staging   Staging form: Breast, AJCC 8th Edition - Pathologic stage from 01/08/2022: Stage IB (pT1b, pN0, cM0, G3, ER-, PR-, HER2-) - Signed by [name], MD on 01/24/2022 Stage prefix: Initial diagnosis Histologic grading system: 3 grade system Residual tumor (R): R0 - None   02/20/2022 - 04/24/2022 Chemotherapy   Patient is on Treatment Plan : BREAST TC q21d     10/06/2022 Survivorship   SCP delivered by Lacie [name], NP   11/13/2023 -  Chemotherapy   Patient is on Treatment Plan : BREAST Pembrolizumab  (200) D1 + Carboplatin  (5) D1 + Paclitaxel  (80) D1,8,15 q21d X 4 cycles / Pembrolizumab  (200) D1 + AC D1 q21d x 4 cycles     Ductal carcinoma in situ (DCIS) of left breast  12/09/2022 Initial Diagnosis   Ductal carcinoma in situ (DCIS) of left breast   01/01/2023 Surgery   Let breast seed localized lumpectomy.with Dr. Aron  2.5 cm DCIS with negative margins.     02/11/2023 - 03/10/2023 Radiation Therapy   Dose per fraction - 2.66 Gy Prescribed dose (delivered/prescribed)  42.56/42.56 Prescribed Fxs (delivered/prescribed)  16/16  Boost  Dose per fraction -  2Gy Prescribed dose (delivered/prescribed) 8Gy/8Gy) Prescribed Fxs (delivered/prescribed) (4Gy/4Gy)         REVIEW OF SYSTEMS:   Constitutional: Denies fevers, chills or abnormal weight loss. Fatigue. Decreased appetite. Foods not tasting well or seeming palatable.  Eyes: Denies blurriness of vision Ears, nose, mouth, throat, and face: Denies mucositis or sore throat Respiratory: Denies cough, dyspnea or wheezes Cardiovascular: Denies palpitation, chest discomfort or lower extremity swelling Gastrointestinal:  Denies heartburn or change in bowel habits. Noticed increased nausea on the day of treatment, but did not have vomiting.  Skin: Denies abnormal skin  rashes. New areas of fine, raised bumps on her upper and mid back. Not itchy or red in color.  Lymphatics: Denies new lymphadenopathy or easy bruising Neurological:Denies numbness, tingling or new weaknesses Behavioral/Psych: Mood is stable, no new changes  All other systems were reviewed with the patient and are negative.   VITALS:   Today's Vitals   12/24/23 0949  BP: (!) 115/59  Pulse: 95  Resp: 16  Temp: 97.9 F (36.6 C)  TempSrc: Temporal  SpO2: 99%  Weight: 161 lb 14.4 oz (73.4 kg)   Body mass index is 25.36 kg/m.    Wt Readings from Last 3 Encounters:  12/24/23 161 lb 14.4 oz (73.4 kg)  12/15/23 163 lb (73.9 kg)  12/10/23 168 lb (76.2 kg)    Body mass index is 25.36 kg/m.  Performance status (ECOG): 1 - Symptomatic but completely ambulatory  PHYSICAL EXAM:   GENERAL:alert, no distress and comfortable SKIN: skin color, texture, turgor are normal, no significant lesions. New areas of dry, rough skin on the upper and mid back. There  are fine, raised bumps noted in same areas. No redness or warmth present. Skin intact with no drainage. Red, patchy areas of rash of started to lighten in color on arms and hands. Persistent red patches on lower legs and thighs. Not itchy. Decreased nodularity.  EYES: normal, Conjunctiva are pink and non-injected, sclera clear OROPHARYNX:no exudate, no erythema and lips, buccal mucosa, and tongue normal  NECK: supple, thyroid  normal size, non-tender, without nodularity LYMPH:  no palpable lymphadenopathy in the cervical, axillary or inguinal LUNGS: clear to auscultation and percussion with normal breathing effort HEART: regular rate & rhythm and no murmurs and no lower extremity edema ABDOMEN:abdomen soft, non-tender and normal bowel sounds Musculoskeletal:no cyanosis of digits and no clubbing  NEURO: alert & oriented x 3 with fluent speech, no focal motor/sensory deficits  LABORATORY DATA:  I have reviewed the data as listed     Component Value Date/Time   NA 141 12/24/2023 0919   K 3.7 12/24/2023 0919   CL 105 12/24/2023 0919   CO2 30 12/24/2023 0919   GLUCOSE 143 (H) 12/24/2023 0919   BUN 11 12/24/2023 0919   CREATININE 0.57 12/24/2023 0919   CALCIUM  9.4 12/24/2023 0919   PROT 7.1 12/24/2023 0919   ALBUMIN 4.5 12/24/2023 0919   AST 26 12/24/2023 0919   ALT 47 (H) 12/24/2023 0919   ALKPHOS 101 12/24/2023 0919   BILITOT 0.3 12/24/2023 0919   GFRNONAA >60 12/24/2023 0919   GFRAA >90 03/09/2013 2327    Lab Results  Component Value Date   WBC 2.9 (L) 12/24/2023   NEUTROABS 1.7 12/24/2023   HGB 10.4 (L) 12/24/2023   HCT 31.0 (L) 12/24/2023   MCV 95.7 12/24/2023   PLT 149 (L) 12/24/2023

## 2023-12-23 NOTE — Assessment & Plan Note (Signed)
 IDC and DCIS, Stage IA, pT1b, cN0, triple negative, Grade 3, chest wall recurrence in 10/2023  -found on screening mammogram. S/p lumpectomy on 01/08/22 with Dr. Aron, path showed: 8 mm invasive and in situ ductal carcinoma with negative margins. Repeat prognostic panel confirmed triple negative disease.  -lymph node biopsies 01/28/22 showed negative nodes (0/2). Port placed at time of procedure. Postoperative course complicated by pneumothorax, which has resolved. -she began adjuvant TC on 02/20/22, and completed planned 4 cycles on 04/24/2022. -she completed adjuvant RT on 07/09/2022  -biopsy confirmed chest lymph nodes (axilla and chest wall including posteriad pectoralis muscle)  recurrence in 10/2023 - Case reviewed in breast tumor board, plan to start neoadjuvant chemotherapy with carboplatin  and paclitaxel  every 3 weeks for 4 cycles, followed by AC every 3 weeks for 4 cycles, along with immunotherapy Keytruda  for 1 year.  If she has good response to treatment, Dr. Aron will consider surgery although she is not able to remove the aubpectoral lymph node.  She may need adjuvant radiation after surgery. -currently on weekly Taxol  and Keytruda  every 3 weeks. Has developed rash, anemia, and leukocytosis. Changed to Abraxane  (protein bound paclitaxol) with Cycle 3. Will receive G-CSF for 3 days.  --12/15/2023 - worsening neutropenia. Will proceed with treatment 12/16/2023 with abraxane  and carboplatin . She will receive Zarzio injections 12/17/2023, 12/18/2023, and 12/19/2023.

## 2023-12-24 ENCOUNTER — Ambulatory Visit

## 2023-12-24 ENCOUNTER — Other Ambulatory Visit

## 2023-12-24 ENCOUNTER — Inpatient Hospital Stay

## 2023-12-24 ENCOUNTER — Other Ambulatory Visit: Payer: Self-pay

## 2023-12-24 ENCOUNTER — Inpatient Hospital Stay (HOSPITAL_BASED_OUTPATIENT_CLINIC_OR_DEPARTMENT_OTHER): Admitting: Nurse Practitioner

## 2023-12-24 ENCOUNTER — Inpatient Hospital Stay: Admitting: Dietician

## 2023-12-24 ENCOUNTER — Other Ambulatory Visit (HOSPITAL_COMMUNITY): Payer: Self-pay

## 2023-12-24 VITALS — BP 115/59 | HR 95 | Temp 97.9°F | Resp 16 | Wt 161.9 lb

## 2023-12-24 DIAGNOSIS — Z171 Estrogen receptor negative status [ER-]: Secondary | ICD-10-CM

## 2023-12-24 DIAGNOSIS — Z5112 Encounter for antineoplastic immunotherapy: Secondary | ICD-10-CM | POA: Diagnosis not present

## 2023-12-24 DIAGNOSIS — C50411 Malignant neoplasm of upper-outer quadrant of right female breast: Secondary | ICD-10-CM

## 2023-12-24 LAB — CBC WITH DIFFERENTIAL (CANCER CENTER ONLY)
Abs Immature Granulocytes: 0.1 K/uL — ABNORMAL HIGH (ref 0.00–0.07)
Basophils Absolute: 0 K/uL (ref 0.0–0.1)
Basophils Relative: 1 %
Eosinophils Absolute: 0 K/uL (ref 0.0–0.5)
Eosinophils Relative: 0 %
HCT: 31 % — ABNORMAL LOW (ref 36.0–46.0)
Hemoglobin: 10.4 g/dL — ABNORMAL LOW (ref 12.0–15.0)
Immature Granulocytes: 4 %
Lymphocytes Relative: 27 %
Lymphs Abs: 0.8 K/uL (ref 0.7–4.0)
MCH: 32.1 pg (ref 26.0–34.0)
MCHC: 33.5 g/dL (ref 30.0–36.0)
MCV: 95.7 fL (ref 80.0–100.0)
Monocytes Absolute: 0.3 K/uL (ref 0.1–1.0)
Monocytes Relative: 11 %
Neutro Abs: 1.7 K/uL (ref 1.7–7.7)
Neutrophils Relative %: 57 %
Platelet Count: 149 K/uL — ABNORMAL LOW (ref 150–400)
RBC: 3.24 MIL/uL — ABNORMAL LOW (ref 3.87–5.11)
RDW: 15.3 % (ref 11.5–15.5)
WBC Count: 2.9 K/uL — ABNORMAL LOW (ref 4.0–10.5)
nRBC: 0.7 % — ABNORMAL HIGH (ref 0.0–0.2)

## 2023-12-24 LAB — CMP (CANCER CENTER ONLY)
ALT: 47 U/L — ABNORMAL HIGH (ref 0–44)
AST: 26 U/L (ref 15–41)
Albumin: 4.5 g/dL (ref 3.5–5.0)
Alkaline Phosphatase: 101 U/L (ref 38–126)
Anion gap: 6 (ref 5–15)
BUN: 11 mg/dL (ref 8–23)
CO2: 30 mmol/L (ref 22–32)
Calcium: 9.4 mg/dL (ref 8.9–10.3)
Chloride: 105 mmol/L (ref 98–111)
Creatinine: 0.57 mg/dL (ref 0.44–1.00)
GFR, Estimated: >60 mL/min (ref >60)
Glucose, Bld: 143 mg/dL — ABNORMAL HIGH (ref 70–99)
Potassium: 3.7 mmol/L (ref 3.5–5.1)
Sodium: 141 mmol/L (ref 135–145)
Total Bilirubin: 0.3 mg/dL (ref 0.0–1.2)
Total Protein: 7.1 g/dL (ref 6.5–8.1)

## 2023-12-24 LAB — TSH: TSH: 8.02 u[IU]/mL — ABNORMAL HIGH (ref 0.350–4.500)

## 2023-12-24 MED ORDER — SODIUM CHLORIDE 0.9 % IV SOLN
571.5000 mg | Freq: Once | INTRAVENOUS | Status: AC
  Administered 2023-12-24: 570 mg via INTRAVENOUS
  Filled 2023-12-24: qty 57

## 2023-12-24 MED ORDER — PALONOSETRON HCL INJECTION 0.25 MG/5ML
0.2500 mg | Freq: Once | INTRAVENOUS | Status: AC
  Administered 2023-12-24: 0.25 mg via INTRAVENOUS
  Filled 2023-12-24: qty 5

## 2023-12-24 MED ORDER — PACLITAXEL PROTEIN-BOUND CHEMO INJECTION 100 MG
80.0000 mg/m2 | Freq: Once | INTRAVENOUS | Status: AC
  Administered 2023-12-24: 150 mg via INTRAVENOUS
  Filled 2023-12-24: qty 30

## 2023-12-24 MED ORDER — SODIUM CHLORIDE 0.9 % IV SOLN
200.0000 mg | Freq: Once | INTRAVENOUS | Status: AC
  Administered 2023-12-24: 200 mg via INTRAVENOUS
  Filled 2023-12-24: qty 200

## 2023-12-24 MED ORDER — CETIRIZINE HCL 10 MG PO TABS
10.0000 mg | ORAL_TABLET | Freq: Once | ORAL | Status: AC
  Administered 2023-12-24: 10 mg via ORAL
  Filled 2023-12-24: qty 1

## 2023-12-24 MED ORDER — SODIUM CHLORIDE 0.9 % IV SOLN: INTRAVENOUS | Status: DC

## 2023-12-24 MED ORDER — APREPITANT 130 MG/18ML IV EMUL
130.0000 mg | Freq: Once | INTRAVENOUS | Status: AC
  Administered 2023-12-24: 130 mg via INTRAVENOUS
  Filled 2023-12-24: qty 18

## 2023-12-24 MED ORDER — NYSTATIN 100000 UNIT/ML MT SUSP
5.0000 mL | Freq: Four times a day (QID) | OROMUCOSAL | 1 refills | Status: DC | PRN
  Filled 2023-12-24 – 2023-12-28 (×2): qty 140, 7d supply, fill #0
  Filled 2024-01-09: qty 140, 7d supply, fill #1

## 2023-12-24 NOTE — Progress Notes (Signed)
 Nutrition Assessment   Reason for Assessment: +MST   ASSESSMENT: 63 year old female with recurrence of breast cancer in left chest wall, triple negative. She is currently receiving chemoimmunotherapy with carbo/abraxane  + keytruda  q21d. Patient is under the care of Dr. Lanny.   Past medical history includes DCIS of left breast (2023) s/p lumpectomy followed by adjuvant TC x4/radiation, hypothyroidism, GERD, thoracic disc disorder, plantar fasciitis, carpal tunnel syndrome, HLD, anxiety  Met with patient in infusion. She reports good appetite, however po intake is poor secondary to taste. Foods either have no taste or taste rotten. Currently eating bowl of plain oatmeal mixed with water, topped with milk. Has a cup of strawberry or peach greek yogurt at lunch. Last night she had ~3 oz piece of fried flounder and few bites of baked potato. Patient has recently started drinking Boost. Tolerates this ok if she drinks quickly.    Nutrition Focused Physical Exam: deferred    Medications: xanax, lipitor, D3, pepcid , norco, imodium, MMW, nystatin , protonix , compazine , zoloft , valtrex, zofran    Labs: glucose 143   Anthropometrics:   Height: 5'7 Weight: 161 lb 14.4 oz  UBW: 175-180 lb (175 lb 8 oz on 7/8) BMI: 25.36   NUTRITION DIAGNOSIS: Unintended wt loss related to cancer and associated treatment side effects as evidenced by altered taste, 8% wt loss in 2 months which is clinically severe for time frame   INTERVENTION:  Educated on strategies for taste changes, suggested trying baking soda salt water rinses before meals - handout with tips + recipe provided Encourage small meals/snacks 4-6/day - suggest soft moist foods for ease of intake (adding sauce/gravy/condiments, shakes/smoothies) - snack ideas, soft moist high protein foods, shake/smoothie recipes provided  Recommend 1-2 Ensure Complete/equivalent for added calories and protein - Ensure Complete/Clear + CIB powder samples  provided (suggest mixing powder with fairlife milk) Contact information provided    MONITORING, EVALUATION, GOAL: Pt will tolerate increased intake of calories/protein to minimize further wt loss    Next Visit: Thursday September 25 during infusion

## 2023-12-25 LAB — T4: T4, Total: 7.1 ug/dL (ref 4.5–12.0)

## 2023-12-27 ENCOUNTER — Encounter: Payer: Self-pay | Admitting: Nurse Practitioner

## 2023-12-27 ENCOUNTER — Encounter: Payer: Self-pay | Admitting: Hematology

## 2023-12-28 ENCOUNTER — Other Ambulatory Visit (HOSPITAL_COMMUNITY): Payer: Self-pay

## 2023-12-28 ENCOUNTER — Other Ambulatory Visit: Payer: Self-pay | Admitting: Nurse Practitioner

## 2023-12-28 ENCOUNTER — Encounter: Payer: Self-pay | Admitting: Hematology

## 2023-12-28 DIAGNOSIS — C50411 Malignant neoplasm of upper-outer quadrant of right female breast: Secondary | ICD-10-CM

## 2023-12-28 MED ORDER — FLUCONAZOLE 150 MG PO TABS: ORAL_TABLET | ORAL | 1 refills | Status: DC

## 2023-12-31 ENCOUNTER — Ambulatory Visit

## 2024-01-01 ENCOUNTER — Inpatient Hospital Stay (HOSPITAL_BASED_OUTPATIENT_CLINIC_OR_DEPARTMENT_OTHER): Admitting: Nurse Practitioner

## 2024-01-01 ENCOUNTER — Inpatient Hospital Stay

## 2024-01-01 ENCOUNTER — Other Ambulatory Visit

## 2024-01-01 ENCOUNTER — Ambulatory Visit

## 2024-01-01 VITALS — BP 120/70 | HR 105 | Temp 97.4°F | Resp 16 | Ht 67.0 in | Wt 159.1 lb

## 2024-01-01 VITALS — BP 124/73 | HR 89 | Temp 98.8°F | Resp 18

## 2024-01-01 DIAGNOSIS — C50411 Malignant neoplasm of upper-outer quadrant of right female breast: Secondary | ICD-10-CM | POA: Diagnosis not present

## 2024-01-01 DIAGNOSIS — Z5112 Encounter for antineoplastic immunotherapy: Secondary | ICD-10-CM | POA: Diagnosis not present

## 2024-01-01 DIAGNOSIS — Z171 Estrogen receptor negative status [ER-]: Secondary | ICD-10-CM

## 2024-01-01 LAB — CMP (CANCER CENTER ONLY)
ALT: 32 U/L (ref 0–44)
AST: 23 U/L (ref 15–41)
Albumin: 4.6 g/dL (ref 3.5–5.0)
Alkaline Phosphatase: 98 U/L (ref 38–126)
Anion gap: 6 (ref 5–15)
BUN: 12 mg/dL (ref 8–23)
CO2: 30 mmol/L (ref 22–32)
Calcium: 9.6 mg/dL (ref 8.9–10.3)
Chloride: 104 mmol/L (ref 98–111)
Creatinine: 0.47 mg/dL (ref 0.44–1.00)
GFR, Estimated: >60 mL/min (ref >60)
Glucose, Bld: 155 mg/dL — ABNORMAL HIGH (ref 70–99)
Potassium: 3.7 mmol/L (ref 3.5–5.1)
Sodium: 140 mmol/L (ref 135–145)
Total Bilirubin: 0.3 mg/dL (ref 0.0–1.2)
Total Protein: 7.3 g/dL (ref 6.5–8.1)

## 2024-01-01 LAB — CBC WITH DIFFERENTIAL (CANCER CENTER ONLY)
Abs Immature Granulocytes: 0.01 K/uL (ref 0.00–0.07)
Basophils Absolute: 0 K/uL (ref 0.0–0.1)
Basophils Relative: 1 %
Eosinophils Absolute: 0 K/uL (ref 0.0–0.5)
Eosinophils Relative: 1 %
HCT: 30.4 % — ABNORMAL LOW (ref 36.0–46.0)
Hemoglobin: 10.1 g/dL — ABNORMAL LOW (ref 12.0–15.0)
Immature Granulocytes: 0 %
Lymphocytes Relative: 28 %
Lymphs Abs: 0.6 K/uL — ABNORMAL LOW (ref 0.7–4.0)
MCH: 32.1 pg (ref 26.0–34.0)
MCHC: 33.2 g/dL (ref 30.0–36.0)
MCV: 96.5 fL (ref 80.0–100.0)
Monocytes Absolute: 0.1 K/uL (ref 0.1–1.0)
Monocytes Relative: 5 %
Neutro Abs: 1.5 K/uL — ABNORMAL LOW (ref 1.7–7.7)
Neutrophils Relative %: 65 %
Platelet Count: 195 K/uL (ref 150–400)
RBC: 3.15 MIL/uL — ABNORMAL LOW (ref 3.87–5.11)
RDW: 15.8 % — ABNORMAL HIGH (ref 11.5–15.5)
WBC Count: 2.3 K/uL — ABNORMAL LOW (ref 4.0–10.5)
nRBC: 0 % (ref 0.0–0.2)

## 2024-01-01 MED ORDER — ALPRAZOLAM 0.5 MG PO TABS: 0.5000 mg | ORAL_TABLET | Freq: Every day | ORAL | 1 refills | Status: AC | PRN

## 2024-01-01 MED ORDER — SODIUM CHLORIDE 0.9 % IV SOLN: INTRAVENOUS | Status: DC

## 2024-01-01 MED ORDER — PROCHLORPERAZINE MALEATE 10 MG PO TABS: 10.0000 mg | ORAL_TABLET | Freq: Once | ORAL | Status: DC

## 2024-01-01 MED ORDER — ONDANSETRON HCL 8 MG PO TABS
8.0000 mg | ORAL_TABLET | Freq: Once | ORAL | Status: AC
  Administered 2024-01-01: 8 mg via ORAL
  Filled 2024-01-01: qty 1

## 2024-01-01 MED ORDER — PACLITAXEL PROTEIN-BOUND CHEMO INJECTION 100 MG
80.0000 mg/m2 | Freq: Once | INTRAVENOUS | Status: AC
  Administered 2024-01-01: 150 mg via INTRAVENOUS
  Filled 2024-01-01: qty 30

## 2024-01-01 NOTE — Progress Notes (Signed)
 Per Wilkie Lessen PA - OK to treat with Abraxane  with HR 105

## 2024-01-01 NOTE — Progress Notes (Signed)
 With Powell, NP ok, switched Compazine  to Zofran  per patient request due to sedation with Compazine . Change made for today's treatment and future D8/15.  Harlene Nasuti, PharmD Oncology Infusion Pharmacist 01/01/2024 1:05 PM

## 2024-01-01 NOTE — Assessment & Plan Note (Addendum)
 IDC and DCIS, Stage IA, pT1b, cN0, triple negative, Grade 3, chest wall recurrence in 10/2023  -found on screening mammogram. S/p lumpectomy on 01/08/22 with Dr. Aron, path showed: 8 mm invasive and in situ ductal carcinoma with negative margins. Repeat prognostic panel confirmed triple negative disease.  -lymph node biopsies 01/28/22 showed negative nodes (0/2). Port placed at time of procedure. Postoperative course complicated by pneumothorax, which has resolved. -she began adjuvant TC on 02/20/22, and completed planned 4 cycles on 04/24/2022. -she completed adjuvant RT on 07/09/2022  -biopsy confirmed chest lymph nodes (axilla and chest wall including posteriad pectoralis muscle)  recurrence in 10/2023 - Case reviewed in breast tumor board, plan to start neoadjuvant chemotherapy with carboplatin  and paclitaxel  every 3 weeks for 4 cycles, followed by AC every 3 weeks for 4 cycles, along with immunotherapy Keytruda  for 1 year.  If she has good response to treatment, Dr. Aron will consider surgery although she is not able to remove the aubpectoral lymph node.  She may need adjuvant radiation after surgery. -currently on weekly Taxol  and Keytruda  every 3 weeks. Has developed rash, anemia, and leukocytosis. Changed to Abraxane  (protein bound paclitaxol) with Cycle 3. Will receive G-CSF for 3 days.  --12/15/2023 - worsening neutropenia. Will proceed with treatment 12/16/2023 with abraxane  and carboplatin . She will receive Zarzio injections 12/17/2023, 12/18/2023, and 12/19/2023.  -12/23/2023 -she has tolerated change from paclitaxel  to Abraxane  well with dry, rough skin present, and increased nausea on day of chemotherapy.  ANC count has recovered.  Will proceed with cycle 3 day 1 of chemotherapy with carboplatin  and Abraxane  and immunotherapy Keytruda . - 01/01/2024 -she presents for cycle 3 day 8 chemotherapy carboplatin  and Abraxane .  Overall, tolerating well with increased fatigue.  She has also noticed decreased  appetite along with increased constipation.  She is due for CT CAP in the next 2 to 3 weeks.  Ordered part of today's visit.

## 2024-01-01 NOTE — Progress Notes (Signed)
 Patient Care Team: [name], MD as PCP - General (Internal Medicine) [name], RN as Oncology Nurse Navigator [name], MD as Consulting Physician (General Surgery) [name], MD as Consulting Physician (Hematology) [name], MD as Consulting Physician (Radiation Oncology) [name], MD as Consulting Physician (Endocrinology) [name], MD as Consulting Physician (Sports Medicine) [name], MD as Consulting Physician (Obstetrics and Gynecology) [name], [name], NP as Nurse Practitioner (Nurse Practitioner) Imaging, [facility] as Radiologist (Diagnostic Radiology)  Clinic Day:  01/01/2024  Referring physician: [name], MD  ASSESSMENT & PLAN:   Assessment & Plan: Malignant neoplasm of upper-outer quadrant of right breast in female, estrogen receptor negative (HCC) IDC and DCIS, Stage IA, pT1b, cN0, triple negative, Grade 3, chest wall recurrence in 10/2023  -found on screening mammogram. S/p lumpectomy on 01/08/22 with Dr. Aron, path showed: 8 mm invasive and in situ ductal carcinoma with negative margins. Repeat prognostic panel confirmed triple negative disease.  -lymph node biopsies 01/28/22 showed negative nodes (0/2). Port placed at time of procedure. Postoperative course complicated by pneumothorax, which has resolved. -she began adjuvant TC on 02/20/22, and completed planned 4 cycles on 04/24/2022. -she completed adjuvant RT on 07/09/2022  -biopsy confirmed chest lymph nodes (axilla and chest wall including posteriad pectoralis muscle)  recurrence in 10/2023 - Case reviewed in breast tumor board, plan to start neoadjuvant chemotherapy with carboplatin  and paclitaxel  every 3 weeks for 4 cycles, followed by AC every 3 weeks for 4 cycles, along with immunotherapy Keytruda  for 1 year.  If she has good response to treatment, Dr. Aron will consider surgery although she is not able to remove the aubpectoral lymph node.  She may  need adjuvant radiation after surgery. -currently on weekly Taxol  and Keytruda  every 3 weeks. Has developed rash, anemia, and leukocytosis. Changed to Abraxane  (protein bound paclitaxol) with Cycle 3. Will receive G-CSF for 3 days.  --12/15/2023 - worsening neutropenia. Will proceed with treatment 12/16/2023 with abraxane  and carboplatin . She will receive Zarzio injections 12/17/2023, 12/18/2023, and 12/19/2023.  -12/23/2023 -she has tolerated change from paclitaxel  to Abraxane  well with dry, rough skin present, and increased nausea on day of chemotherapy.  ANC count has recovered.  Will proceed with cycle 3 day 1 of chemotherapy with carboplatin  and Abraxane  and immunotherapy Keytruda . - 01/01/2024 -she presents for cycle 3 day 8 chemotherapy carboplatin  and Abraxane .  Overall, tolerating well with increased fatigue.  She has also noticed decreased appetite along with increased constipation.  She is due for CT CAP in the next 2 to 3 weeks.  Ordered part of today's visit.    Dermatitis and rash Persistent, but slightly improved. Likely combination of previous reaction to paclitaxel  and immunotherapy Keytruda . She continues to use clobetasol  emollient twice daily. Will consider referral to dermatology in future if it worsens again.   Nausea Patient having some nausea and decreased appetite. Admits not taking oral antiemetics. States that compazine  seems to make her very sleepy and zofran  increases constipation. Encouraged her to take antiemetics as prescribed. With compazine  causing fatigue, she may have better results from zofran . Encouraged her to manage constipation with oral miralax every day. Consider concurrent use of stool softener until regular bowel movements are achieved. May back off gradually as bowel movements return to normal or they become loose.   Constipation  Encouraged her to manage constipation with oral miralax every day. Consider concurrent use of stool softener until regular bowel movements  are achieved. May back off  gradually as bowel movements return to normal or they become loose.   Triple negative breast cancer  Continue chemotherapy carboplatin  and abraxan with immunotherapy Keytruda  as scheduled. CT CAP should be scheduled for end of September, beginning of October for evaluation of treatment response. This was ordered as part of today's visit .  Mild anxiety The patient admits feeling anxious at times, especially when coming to the cancer center. She does have older prescription for alprazolam  which she has been using prior to coming to the cancer center. The prescription is for 0.5 mg when needed. She states that she quarters the tablets, essentially taking 0.125 mg prior to Cancer Center visits. Will refill current prescription for her and send to her pharmacy.  Continue to take sertraline  as prescribed .  Plan Labs reviewed.  -mild and stable anemia. Mild leukopenia.  -cmp unremarkable.  Change compazine  (premedication) to zofran  due to negative side effects.  CT CAP to be done late September, early October. Ordered today.  Ok to proceed with chemotherapy Abraxane  today.  Labs/flush, follow up, and treatments as scheduled.    The patient understands the plans discussed today and is in agreement with them.  She knows to contact our office if she develops concerns prior to her next appointment.  I provided 25 minutes of face-to-face time during this encounter and > 50% was spent counseling as documented under my assessment and plan.    Powell FORBES Lessen, NP  [facility] - A DEPT OF [facility] [address] [place] [address] Dept: [phone] Dept Fax: [phone]   Orders Placed This Encounter  Procedures   CT CHEST ABDOMEN PELVIS W CONTRAST    Standing Status:   Future    Number of Occurrences:   1    Expected Date:   01/15/2024    Expiration Date:   12/31/2024    If indicated for the  ordered procedure, I authorize the administration of contrast media per Radiology protocol:   Yes    Does the patient have a contrast media/X-ray dye allergy?:   No    If indicated for the ordered procedure, I authorize the administration of oral contrast media per Radiology protocol:   Yes    Preferred imaging location?:   [facility]      CHIEF COMPLAINT:  CC: right breast cancer, ER -  Current Treatment:  neoadjuvant chemotherapy carboplatin  and abraxane  and immunotherapy Keytruda    INTERVAL HISTORY:  Samantha [name] is here today for repeat clinical assessment. She last saw me on 12/24/2023. She reports feelings of fatigue and weakness. She has decreased appetite. Has noticed that eating dairy has been good. Consuming yogurt and whole milk. Will add protein powder to this at times. This is keeping protein and calorie count adequate. Has had 2 pound weight loss. She has also had intermittent constipation. Was going to try adding metamucil. We discussed trying miralax rather than added fiber. May also add stool softener at first. She continues to haveraised bumps on arms, legs and back. Continue to be not itchy. She states that  Did take She denies fevers or chills. She denies pain. Her appetite is good. Her weight has decreased 3 pounds over last week.  I have reviewed the past medical history, past surgical history, social history and family history with the patient and they are unchanged from previous note.  ALLERGIES:  is allergic to nortriptyline , singulair  [montelukast  sodium], sulfonamide derivatives,  amitriptyline  hcl, esomeprazole, fluticasone , gabapentin , lexapro [escitalopram], neomycin, paclitaxel , pseudoephedrine, and neosporin [bacitracin-polymyxin b].  MEDICATIONS:  Current Outpatient Medications  Medication Sig Dispense Refill   acetaminophen  (TYLENOL ) 325 MG tablet Take 650 mg by mouth every 6 (six) hours as needed.     ALPRAZolam  (XANAX ) 0.5 MG tablet Take 1 tablet (0.5 mg  total) by mouth daily as needed for anxiety. 30 tablet 1   atorvastatin (LIPITOR) 20 MG tablet Take 20 mg by mouth every evening.     betamethasone dipropionate 0.05 % cream Apply 1 Application topically 2 (two) times daily as needed (skin irritation.).     Calcium  Carb-Cholecalciferol (CALCIUM -VITAMIN D3) 250-125 MG-UNIT TABS Take 1 tablet by mouth daily with lunch.     Cholecalciferol (VITAMIN D3) 50 MCG (2000 UT) TABS Take 2,000 Units by mouth daily with lunch.     Clobetasol  Prop Emollient Base 0.05 % emollient cream Apply 1 Application topically 2 (two) times daily. 60 g 1   ezetimibe (ZETIA) 10 MG tablet Take 10 mg by mouth every evening.     famotidine  (PEPCID ) 20 MG tablet Take 1 tablet (20 mg total) by mouth daily. (Patient taking differently: Take 20 mg by mouth daily as needed for heartburn or indigestion.)     Fezolinetant (VEOZAH) 45 MG TABS Take 45 mg by mouth in the morning. (Patient not taking: Reported on 11/26/2023)     fluconazole  (DIFLUCAN ) 150 MG tablet Take 1 tablet po once weekly for thrush 4 tablet 1   HYDROcodone -acetaminophen  (NORCO/VICODIN) 5-325 MG tablet Take 1 tablet by mouth every 6 (six) hours as needed for moderate pain (pain score 4-6). 15 tablet 0   lidocaine -prilocaine  (EMLA ) cream Apply to affected area once 30 g 3   loperamide (IMODIUM) 2 MG capsule Take by mouth as needed for diarrhea or loose stools.     magic mouthwash (nystatin , diphenhydrAMINE , alum & mag hydroxide) suspension mixture Swish and spit 5 mLs 4 (four) times daily as needed for mouth pain. 140 mL 1   nystatin  (MYCOSTATIN ) 100000 UNIT/ML suspension Take 5 mLs (500,000 Units total) by mouth 4 (four) times daily. 473 mL 0   nystatin -triamcinolone (MYCOLOG II) cream Apply 1 Application topically 2 (two) times daily as needed (irritation).     ondansetron  (ZOFRAN ) 8 MG tablet Take 1 tablet (8 mg total) by mouth every 8 (eight) hours as needed for nausea or vomiting. Start on the third day after  chemotherapy. 30 tablet 1   pantoprazole  (PROTONIX ) 40 MG tablet Take 1 tablet (40 mg total) by mouth 2 (two) times daily. 60 tablet 2   prochlorperazine  (COMPAZINE ) 10 MG tablet Take 1 tablet (10 mg total) by mouth every 6 (six) hours as needed for nausea or vomiting. 30 tablet 1   sertraline  (ZOLOFT ) 50 MG tablet Take 50 mg by mouth in the morning.     tacrolimus (PROTOPIC) 0.03 % ointment Apply 1 Application topically 2 (two) times daily as needed (skin irritation.).     TIROSINT  100 MCG CAPS Take 100 mcg by mouth See admin instructions. Take 1 capsule (100 mcg) by mouth every 3 days, alternating with (Tirosint  88 mcg)--take on an empty stomach.     TIROSINT  88 MCG CAPS Take 88 mcg by mouth See admin instructions. Take 1 capsule (88 mcg) by mouth 2 days in a row, alternating with Tirosint  (100 mcg) on the 3 rd day.     triamcinolone ointment (KENALOG) 0.1 % Apply 1 Application topically 2 (two) times daily as needed (skin  irritation.).     valACYclovir (VALTREX) 500 MG tablet Take 500 mg by mouth 2 (two) times daily as needed (cold sore/fever blisters).     No current facility-administered medications for this visit.    HISTORY OF PRESENT ILLNESS:   Oncology History Overview Note   Cancer Staging  Malignant neoplasm of upper-outer quadrant of right breast in female, estrogen receptor negative Staging form: Breast, AJCC 8th Edition - Clinical stage from 12/23/2021: cT1b, cM0, G3, ER-, PR-, HER2- - Unsigned Stage prefix: Initial diagnosis Histologic grading system: 3 grade system - Pathologic stage from 01/08/2022: Stage IB (pT1b, pN0, cM0, G3, ER-, PR-, HER2-) - Signed by [name], MD on 01/24/2022 Stage prefix: Initial diagnosis Histologic grading system: 3 grade system Residual tumor (R): R0 - None     Malignant neoplasm of upper-outer quadrant of right breast in female, estrogen receptor negative (HCC)  12/06/2021 Mammogram   CLINICAL DATA:  Patient returns today to evaluate RIGHT  breast calcifications identified on a recent screening mammogram.   EXAM: DIGITAL DIAGNOSTIC UNILATERAL RIGHT MAMMOGRAM  IMPRESSION: Grouped coarse heterogeneous calcifications within the upper-outer quadrant of the RIGHT breast, measuring 6 mm extent. These may be fibroadenomatous calcifications. Stereotactic biopsy is recommended to exclude malignancy.   12/18/2021 Initial Biopsy   Diagnosis Breast, right, needle core biopsy, upper outer quadrant, x clip - DUCTAL CARCINOMA IN SITU, SOLID AND CRIBRIFORM TYPE WITH COMEDONECROSIS AND ASSOCIATED CALCIFICATIONS, NUCLEAR GRADE 3 OF 3 - FOCAL MICROINVASION IS PRESENT (LESS THAN 1 MM) WITH EVIDENCE OF LYMPHOVASCULAR INVASION - NECROSIS: PRESENT - CALCIFICATIONS: PRESENT - DCIS LENGTH: 7 MM IN GREATEST LINEAR DIMENSION ON FRAGMENTED CORES  PROGNOSTIC INDICATORS Results: IMMUNOHISTOCHEMICAL AND MORPHOMETRIC ANALYSIS PERFORMED MANUALLY The tumor cells are NEGATIVE for Her2 (1+). Estrogen Receptor: 0%, NEGATIVE Progesterone Receptor: 0%, NEGATIVE Proliferation Marker Ki67: 60%   12/23/2021 Initial Diagnosis   Malignant neoplasm of upper-outer quadrant of right breast in female, estrogen receptor negative (HCC)   12/23/2021 Imaging   EXAM: ULTRASOUND OF THE RIGHT AXILLA  IMPRESSION: No abnormal appearing RIGHT axillary lymph nodes.   01/08/2022 Cancer Staging   Staging form: Breast, AJCC 8th Edition - Pathologic stage from 01/08/2022: Stage IB (pT1b, pN0, cM0, G3, ER-, PR-, HER2-) - Signed by [name], MD on 01/24/2022 Stage prefix: Initial diagnosis Histologic grading system: 3 grade system Residual tumor (R): R0 - None   02/20/2022 - 04/24/2022 Chemotherapy   Patient is on Treatment Plan : BREAST TC q21d     10/06/2022 Survivorship   SCP delivered by Lacie [name], NP   11/13/2023 -  Chemotherapy   Patient is on Treatment Plan : BREAST Pembrolizumab  (200) D1 + Carboplatin  (5) D1 + Paclitaxel  (80) D1,8,15 q21d X 4 cycles / Pembrolizumab   (200) D1 + AC D1 q21d x 4 cycles     Ductal carcinoma in situ (DCIS) of left breast  12/09/2022 Initial Diagnosis   Ductal carcinoma in situ (DCIS) of left breast   01/01/2023 Surgery   Let breast seed localized lumpectomy.with Dr. Aron  2.5 cm DCIS with negative margins.     02/11/2023 - 03/10/2023 Radiation Therapy   Dose per fraction - 2.66 Gy Prescribed dose (delivered/prescribed)  42.56/42.56 Prescribed Fxs (delivered/prescribed)  16/16  Boost  Dose per fraction -  2Gy Prescribed dose (delivered/prescribed) 8Gy/8Gy) Prescribed Fxs (delivered/prescribed) (4Gy/4Gy)         REVIEW OF SYSTEMS:   Constitutional: Denies fevers, chills or abnormal weight loss. Fatigue.  Eyes: Denies blurriness of vision Ears, nose, mouth, throat, and  face: Denies mucositis or sore throat Respiratory: Denies cough, dyspnea or wheezes Cardiovascular: Denies palpitation, chest discomfort or lower extremity swelling Gastrointestinal:  Denies nausea, heartburn or change in bowel habits. Increased constipation and nausea. Skin: continues to have skin dermatitis. Slightly improved.  Lymphatics: Denies new lymphadenopathy or easy bruising Neurological:Denies numbness, tingling or new weaknesses Behavioral/Psych: Mood is stable, no new changes  All other systems were reviewed with the patient and are negative.   VITALS:   Today's Vitals   01/01/24 1100 01/01/24 1133  BP:  120/70  Pulse:  (!) 105  Resp:  16  Temp:  (!) 97.4 F (36.3 C)  TempSrc:  Temporal  SpO2:  99%  Weight:  159 lb 1.6 oz (72.2 kg)  Height:  5' 7 (1.702 m)  PainSc: 0-No pain    Body mass index is 24.92 kg/m.   Wt Readings from Last 3 Encounters:  01/07/24 158 lb 11.2 oz (72 kg)  01/01/24 159 lb 1.6 oz (72.2 kg)  12/24/23 161 lb 14.4 oz (73.4 kg)    Body mass index is 24.92 kg/m.  Performance status (ECOG): 1 - Symptomatic but completely ambulatory  PHYSICAL EXAM:   GENERAL:alert, no distress and  comfortable SKIN: essentially unchanged areas of dry, rough skin on the upper and mid back. There  are fine, raised bumps noted in same areas. No redness or warmth present. Skin intact with no drainage. Red, patchy areas of rash of started to lighten in color on arms and hands. Persistent red patches on lower legs and thighs. Not itchy. Decreased nodularity.  EYES: normal, Conjunctiva are pink and non-injected, sclera clear OROPHARYNX:no exudate, no erythema and lips, buccal mucosa, and tongue normal  NECK: supple, thyroid  normal size, non-tender, without nodularity LYMPH:  no palpable lymphadenopathy in the cervical, axillary or inguinal LUNGS: clear to auscultation and percussion with normal breathing effort HEART: regular rate & rhythm and no murmurs and no lower extremity edema ABDOMEN:abdomen soft, non-tender and normal bowel sounds Musculoskeletal:no cyanosis of digits and no clubbing  NEURO: alert & oriented x 3 with fluent speech, no focal motor/sensory deficits  LABORATORY DATA:  I have reviewed the data as listed    Component Value Date/Time   NA 140 01/07/2024 1011   K 3.9 01/07/2024 1011   CL 105 01/07/2024 1011   CO2 30 01/07/2024 1011   GLUCOSE 115 (H) 01/07/2024 1011   BUN 12 01/07/2024 1011   CREATININE 0.53 01/07/2024 1011   CALCIUM  9.4 01/07/2024 1011   PROT 7.0 01/07/2024 1011   ALBUMIN 4.5 01/07/2024 1011   AST 25 01/07/2024 1011   ALT 33 01/07/2024 1011   ALKPHOS 97 01/07/2024 1011   BILITOT 0.4 01/07/2024 1011   GFRNONAA >60 01/07/2024 1011   GFRAA >90 03/09/2013 2327   Lab Results  Component Value Date   WBC 1.4 (L) 01/07/2024   NEUTROABS 0.6 (L) 01/07/2024   HGB 9.7 (L) 01/07/2024   HCT 29.0 (L) 01/07/2024   MCV 99.0 01/07/2024   PLT 150 01/07/2024

## 2024-01-04 ENCOUNTER — Encounter: Payer: Self-pay | Admitting: Nurse Practitioner

## 2024-01-04 ENCOUNTER — Other Ambulatory Visit: Payer: Self-pay

## 2024-01-05 ENCOUNTER — Encounter: Payer: Self-pay | Admitting: Hematology

## 2024-01-06 NOTE — Assessment & Plan Note (Signed)
-  diagnosed in 12/05/2022, ER-/PR-, g2 -s/p left lumpectomy by Dr. Donell Beers on January 01, 2023.  Surgical path showed intermediate grade DCIS with necrosis, margins were negative. -She has completed adjuvant radiation -No role for adjuvant antiestrogen therapy -Continue breast cancer surveillance

## 2024-01-06 NOTE — Assessment & Plan Note (Signed)
 IDC and DCIS, Stage IA, pT1b, cN0, triple negative, Grade 3, chest wall recurrence in 10/2023  -found on screening mammogram. S/p lumpectomy on 01/08/22 with Dr. Aron, path showed: 8 mm invasive and in situ ductal carcinoma with negative margins. Repeat prognostic panel confirmed triple negative disease.  -lymph node biopsies 01/28/22 showed negative nodes (0/2). Port placed at time of procedure. Postoperative course complicated by pneumothorax, which has resolved. -she began adjuvant TC on 02/20/22, and completed planned 4 cycles on 04/24/2022. -she completed adjuvant RT on 07/09/2022  -biopsy confirmed chest lymph nodes (axilla and chest wall including posteriad pectoralis muscle)  recurrence in 10/2023 - Case reviewed in breast tumor board, plan to start neoadjuvant chemotherapy with carboplatin  and paclitaxel  every 3 weeks for 4 cycles, followed by AC every 3 weeks for 4 cycles, along with immunotherapy Keytruda  for 1 year.  If she has good response to treatment, Dr. Aron will consider surgery although she is not able to remove the aubpectoral lymph node.  She may need adjuvant radiation after surgery.

## 2024-01-07 ENCOUNTER — Ambulatory Visit

## 2024-01-07 ENCOUNTER — Ambulatory Visit: Admitting: Hematology

## 2024-01-07 ENCOUNTER — Other Ambulatory Visit

## 2024-01-07 ENCOUNTER — Inpatient Hospital Stay: Admitting: Dietician

## 2024-01-07 ENCOUNTER — Inpatient Hospital Stay

## 2024-01-07 ENCOUNTER — Encounter: Payer: Self-pay | Admitting: *Deleted

## 2024-01-07 ENCOUNTER — Inpatient Hospital Stay: Admitting: Hematology

## 2024-01-07 VITALS — BP 128/78 | HR 95 | Temp 97.2°F | Resp 18 | Ht 67.0 in | Wt 158.7 lb

## 2024-01-07 DIAGNOSIS — Z5112 Encounter for antineoplastic immunotherapy: Secondary | ICD-10-CM | POA: Diagnosis not present

## 2024-01-07 DIAGNOSIS — Z171 Estrogen receptor negative status [ER-]: Secondary | ICD-10-CM

## 2024-01-07 DIAGNOSIS — D0512 Intraductal carcinoma in situ of left breast: Secondary | ICD-10-CM

## 2024-01-07 DIAGNOSIS — C50411 Malignant neoplasm of upper-outer quadrant of right female breast: Secondary | ICD-10-CM | POA: Diagnosis not present

## 2024-01-07 LAB — CBC WITH DIFFERENTIAL (CANCER CENTER ONLY)
Abs Immature Granulocytes: 0.01 K/uL (ref 0.00–0.07)
Basophils Absolute: 0 K/uL (ref 0.0–0.1)
Basophils Relative: 1 %
Eosinophils Absolute: 0 K/uL (ref 0.0–0.5)
Eosinophils Relative: 1 %
HCT: 29 % — ABNORMAL LOW (ref 36.0–46.0)
Hemoglobin: 9.7 g/dL — ABNORMAL LOW (ref 12.0–15.0)
Immature Granulocytes: 1 %
Lymphocytes Relative: 43 %
Lymphs Abs: 0.6 K/uL — ABNORMAL LOW (ref 0.7–4.0)
MCH: 33.1 pg (ref 26.0–34.0)
MCHC: 33.4 g/dL (ref 30.0–36.0)
MCV: 99 fL (ref 80.0–100.0)
Monocytes Absolute: 0.2 K/uL (ref 0.1–1.0)
Monocytes Relative: 15 %
Neutro Abs: 0.6 K/uL — ABNORMAL LOW (ref 1.7–7.7)
Neutrophils Relative %: 39 %
Platelet Count: 150 K/uL (ref 150–400)
RBC: 2.93 MIL/uL — ABNORMAL LOW (ref 3.87–5.11)
RDW: 16.4 % — ABNORMAL HIGH (ref 11.5–15.5)
WBC Count: 1.4 K/uL — ABNORMAL LOW (ref 4.0–10.5)
nRBC: 0 % (ref 0.0–0.2)

## 2024-01-07 LAB — CMP (CANCER CENTER ONLY)
ALT: 33 U/L (ref 0–44)
AST: 25 U/L (ref 15–41)
Albumin: 4.5 g/dL (ref 3.5–5.0)
Alkaline Phosphatase: 97 U/L (ref 38–126)
Anion gap: 5 (ref 5–15)
BUN: 12 mg/dL (ref 8–23)
CO2: 30 mmol/L (ref 22–32)
Calcium: 9.4 mg/dL (ref 8.9–10.3)
Chloride: 105 mmol/L (ref 98–111)
Creatinine: 0.53 mg/dL (ref 0.44–1.00)
GFR, Estimated: >60 mL/min (ref >60)
Glucose, Bld: 115 mg/dL — ABNORMAL HIGH (ref 70–99)
Potassium: 3.9 mmol/L (ref 3.5–5.1)
Sodium: 140 mmol/L (ref 135–145)
Total Bilirubin: 0.4 mg/dL (ref 0.0–1.2)
Total Protein: 7 g/dL (ref 6.5–8.1)

## 2024-01-07 MED ORDER — PACLITAXEL PROTEIN-BOUND CHEMO INJECTION 100 MG
60.0000 mg/m2 | Freq: Once | INTRAVENOUS | Status: AC
  Administered 2024-01-07: 125 mg via INTRAVENOUS
  Filled 2024-01-07: qty 25

## 2024-01-07 MED ORDER — SODIUM CHLORIDE 0.9 % IV SOLN: INTRAVENOUS | Status: DC

## 2024-01-07 MED ORDER — NYSTATIN 100000 UNIT/ML MT SUSP: 5.0000 mL | Freq: Four times a day (QID) | OROMUCOSAL | 0 refills | Status: DC

## 2024-01-07 MED ORDER — ONDANSETRON HCL 8 MG PO TABS
8.0000 mg | ORAL_TABLET | Freq: Once | ORAL | Status: AC
  Administered 2024-01-07: 8 mg via ORAL
  Filled 2024-01-07: qty 1

## 2024-01-07 NOTE — Progress Notes (Signed)
 Nutrition Follow-up:  Pt with recurrence of breast cancer in left chest wall, triple negative. She is currently receiving chemoimmunotherapy with carbo/abraxane  + keytruda  q21d. Patient is under the care of Dr. Lanny.   Met with patient and husband in infusion. She is doing okay. Continues struggling with taste changes and constipation. Appears uncomfortable at visit. Last BM was Monday which was hard. She started daily miralax a couple days ago.   Pt reports some success with interventions discussed last visit. She has switched milk to whole fat fairlife. Mixing with CIB powder which she likes. Did not like vanilla flavor. Patient switched to higher calorie yogurt, but texture is bothersome. Husband made batch of homemade vanilla.   Pt tried baking soda salt water gargles. This did not help with taste, however has been rinsing couple times daily. She reports persistent thrush s/p diflucan  seen at MD visit. Patient to start nystatin .    Medications: reviewed   Labs: glucose 115, Hgb 9.7  Anthropometrics: Wt 158 lb 11.2 oz today  9/19- 159 lb 1.6 oz  9/11 - 161 lb 14.4 oz   NUTRITION DIAGNOSIS: Unintended wt loss    INTERVENTION:  Continue fairlife milk + CIB powder - 2/day in between meals as tolerated Suggested [place] vanilla bean or Nosa brand as high calorie smooth yogurt options Continue small meals/snacks  Suggested stool softener + miralax Support and encouragement     MONITORING, EVALUATION, GOAL: wt trends, intake    NEXT VISIT: Wednesday October 22 during infusion

## 2024-01-07 NOTE — Patient Instructions (Signed)

## 2024-01-07 NOTE — Progress Notes (Signed)
 [facility]   [phone] [phone]   Clinic Follow up Note   Patient Care Team: [name], MD as PCP - General (Internal Medicine) [name], RN as Oncology Nurse Navigator [name], MD as Consulting Physician (General Surgery) [name], MD as Consulting Physician (Hematology) [name], MD as Consulting Physician (Radiation Oncology) [name], MD as Consulting Physician (Endocrinology) [name], MD as Consulting Physician (Sports Medicine) [name], MD as Consulting Physician (Obstetrics and Gynecology) [name], [name], NP as Nurse Practitioner (Nurse Practitioner) Imaging, [facility] as Radiologist (Diagnostic Radiology)  Date of Service:  01/07/2024  CHIEF COMPLAINT: f/u of right breast cancer  CURRENT THERAPY:  Neoadjuvant chemotherapy carboplatin , paclitaxel  and Keytruda   Oncology History   Ductal carcinoma in situ (DCIS) of left breast -diagnosed in 12/05/2022, ER-/PR-, g2 -s/p left lumpectomy by Dr. Aron on January 01, 2023.  Surgical path showed intermediate grade DCIS with necrosis, margins were negative. -She has completed adjuvant radiation -No role for adjuvant antiestrogen therapy -Continue breast cancer surveillance  Malignant neoplasm of upper-outer quadrant of right breast in female, estrogen receptor negative (HCC) IDC and DCIS, Stage IA, pT1b, cN0, triple negative, Grade 3, chest wall recurrence in 10/2023  -found on screening mammogram. S/p lumpectomy on 01/08/22 with Dr. Aron, path showed: 8 mm invasive and in situ ductal carcinoma with negative margins. Repeat prognostic panel confirmed triple negative disease.  -lymph node biopsies 01/28/22 showed negative nodes (0/2). Port placed at time of procedure. Postoperative course complicated by pneumothorax, which has resolved. -she began adjuvant TC on 02/20/22, and completed planned 4 cycles on 04/24/2022. -she  completed adjuvant RT on 07/09/2022  -biopsy confirmed chest lymph nodes (axilla and chest wall including posteriad pectoralis muscle)  recurrence in 10/2023 - Case reviewed in breast tumor board, plan to start neoadjuvant chemotherapy with carboplatin  and paclitaxel  every 3 weeks for 4 cycles, followed by AC every 3 weeks for 4 cycles, along with immunotherapy Keytruda  for 1 year.  If she has good response to treatment, Dr. Aron will consider surgery although she is not able to remove the aubpectoral lymph node.  She may need adjuvant radiation after surgery.  Assessment & Plan Malignant neoplasm of right breast with nodal metastasis Ongoing chemotherapy with Keytruda , Abraxane  and carboplatin . Experiencing side effects including skin rash, neuropathy, and low blood counts. Rash improved with clobetasol  and Benadryl . Neuropathy is mild to moderate, affecting the tips of fingers and toes. Low neutrophil count noted, but hemoglobin and platelet counts are acceptable for treatment. Treatment is nearing the last cycle, with a scan scheduled to assess response. Carbo makes her significantly sick, and dose reduction is planned to balance side effects and disease control. - Reduce Abraxane  dose by 25%. - Reduce carboplatin  dose by 20% next week. - Order scan to assess treatment response. - Monitor neuropathy and adjust treatment if it worsens. - Encourage exercise, including squeezing a ball and walking. - Consider B12 or B complex supplementation for nerve health.  Nausea and constipation related to cancer therapy Nausea and constipation are ongoing issues, exacerbated by chemotherapy. Zofran  helps with nausea but worsens constipation. Compazine  is effective but causes drowsiness. Constipation has been persistent for a few weeks. - Use Compazine  more frequently for nausea management.  Plan - Lab reviewed, adequate for treatment, will proceed cycle 3-day 15 Abraxane  today, she will return for G-CSF  injection for 3 days.  I started reduced Abraxane  dose due to the neuropathy -  She was started cycle 4 chemotherapy in a week, will reduce carboplatin  to AUC 4 due to overall fatigue and nausea. -f/u in one week with a restaging CT scan before.   SUMMARY OF ONCOLOGIC HISTORY: Oncology History Overview Note   Cancer Staging  Malignant neoplasm of upper-outer quadrant of right breast in female, estrogen receptor negative Staging form: Breast, AJCC 8th Edition - Clinical stage from 12/23/2021: cT1b, cM0, G3, ER-, PR-, HER2- - Unsigned Stage prefix: Initial diagnosis Histologic grading system: 3 grade system - Pathologic stage from 01/08/2022: Stage IB (pT1b, pN0, cM0, G3, ER-, PR-, HER2-) - Signed by [name], MD on 01/24/2022 Stage prefix: Initial diagnosis Histologic grading system: 3 grade system Residual tumor (R): R0 - None     Malignant neoplasm of upper-outer quadrant of right breast in female, estrogen receptor negative (HCC)  12/06/2021 Mammogram   CLINICAL DATA:  Patient returns today to evaluate RIGHT breast calcifications identified on a recent screening mammogram.   EXAM: DIGITAL DIAGNOSTIC UNILATERAL RIGHT MAMMOGRAM  IMPRESSION: Grouped coarse heterogeneous calcifications within the upper-outer quadrant of the RIGHT breast, measuring 6 mm extent. These may be fibroadenomatous calcifications. Stereotactic biopsy is recommended to exclude malignancy.   12/18/2021 Initial Biopsy   Diagnosis Breast, right, needle core biopsy, upper outer quadrant, x clip - DUCTAL CARCINOMA IN SITU, SOLID AND CRIBRIFORM TYPE WITH COMEDONECROSIS AND ASSOCIATED CALCIFICATIONS, NUCLEAR GRADE 3 OF 3 - FOCAL MICROINVASION IS PRESENT (LESS THAN 1 MM) WITH EVIDENCE OF LYMPHOVASCULAR INVASION - NECROSIS: PRESENT - CALCIFICATIONS: PRESENT - DCIS LENGTH: 7 MM IN GREATEST LINEAR DIMENSION ON FRAGMENTED CORES  PROGNOSTIC INDICATORS Results: IMMUNOHISTOCHEMICAL AND MORPHOMETRIC ANALYSIS PERFORMED  MANUALLY The tumor cells are NEGATIVE for Her2 (1+). Estrogen Receptor: 0%, NEGATIVE Progesterone Receptor: 0%, NEGATIVE Proliferation Marker Ki67: 60%   12/23/2021 Initial Diagnosis   Malignant neoplasm of upper-outer quadrant of right breast in female, estrogen receptor negative (HCC)   12/23/2021 Imaging   EXAM: ULTRASOUND OF THE RIGHT AXILLA  IMPRESSION: No abnormal appearing RIGHT axillary lymph nodes.   01/08/2022 Cancer Staging   Staging form: Breast, AJCC 8th Edition - Pathologic stage from 01/08/2022: Stage IB (pT1b, pN0, cM0, G3, ER-, PR-, HER2-) - Signed by [name], MD on 01/24/2022 Stage prefix: Initial diagnosis Histologic grading system: 3 grade system Residual tumor (R): R0 - None   02/20/2022 - 04/24/2022 Chemotherapy   Patient is on Treatment Plan : BREAST TC q21d     10/06/2022 Survivorship   SCP delivered by Lacie [name], NP   11/13/2023 -  Chemotherapy   Patient is on Treatment Plan : BREAST Pembrolizumab  (200) D1 + Carboplatin  (5) D1 + Paclitaxel  (80) D1,8,15 q21d X 4 cycles / Pembrolizumab  (200) D1 + AC D1 q21d x 4 cycles     Ductal carcinoma in situ (DCIS) of left breast  12/09/2022 Initial Diagnosis   Ductal carcinoma in situ (DCIS) of left breast   01/01/2023 Surgery   Let breast seed localized lumpectomy.with Dr. Aron  2.5 cm DCIS with negative margins.     02/11/2023 - 03/10/2023 Radiation Therapy   Dose per fraction - 2.66 Gy Prescribed dose (delivered/prescribed)  42.56/42.56 Prescribed Fxs (delivered/prescribed)  16/16  Boost  Dose per fraction -  2Gy Prescribed dose (delivered/prescribed) 8Gy/8Gy) Prescribed Fxs (delivered/prescribed) (4Gy/4Gy)        Discussed the use of AI scribe software for clinical note transcription with the patient, who gave verbal consent to proceed.  History of Present Illness Samantha [name] is a 63 year old female  with breast cancer who presents for follow-up.  She is undergoing chemotherapy with Abraxane   and carboplatin , experiencing significant nausea and constipation. Nausea is exacerbated by carboplatin , and Zofran  worsens constipation. She prefers Compazine  for nausea, despite drowsiness. Constipation has persisted for a couple of weeks.  Neuropathy presents as numbness and tingling in her fingers and feet, described as feeling like wearing a 'really rough pair of socks'. She maintains the ability to perform fine motor tasks.  Oral thrush has been present since the start of treatment, managed with nystatin  mouthwash and a prescribed pill. She is scheduled for injections with variable administration speed.     All other systems were reviewed with the patient and are negative.  MEDICAL HISTORY:  Past Medical History:  Diagnosis Date   Allergy    Anxiety 2015   Result of culmination of super stressful caregiving and deaths of 2 immediate family members.  Overall do pretty well.   Cancer [facility]) 2023   right breast   Coronary artery calcification    Depression    denies   GERD (gastroesophageal reflux disease)    Hyperlipidemia    Hypothyroidism    Right carpal tunnel syndrome 09/19/2019   Thyroid  disease     SURGICAL HISTORY: Past Surgical History:  Procedure Laterality Date   BREAST BIOPSY Left 12/04/2022   MM LT BREAST BX W LOC DEV 1ST LESION IMAGE BX SPEC STEREO GUIDE 12/04/2022 [facility]   BREAST BIOPSY  12/30/2022   MM LT RADIOACTIVE SEED LOC MAMMO GUIDE 12/30/2022 [facility]   BREAST LUMPECTOMY WITH RADIOACTIVE SEED LOCALIZATION Right 01/08/2022   Procedure: RIGHT BREAST LUMPECTOMY WITH RADIOACTIVE SEED LOCALIZATION;  Surgeon: [name], MD;  Location: [facility] OR;  Service: General;  Laterality: Right;   BREAST LUMPECTOMY WITH RADIOACTIVE SEED LOCALIZATION Left 01/01/2023   Procedure: LEFT BREAST SEED LOCALIZED LUMPECTOMY;  Surgeon: [name], MD;  Location: [facility] OR;  Service: General;  Laterality: Left;   PORT-A-CATH REMOVAL Left 05/20/2022   Procedure: REMOVAL  PORT-A-CATH;  Surgeon: [name], MD;  Location: [facility];  Service: General;  Laterality: Left;   PORTACATH PLACEMENT Left 01/28/2022   Procedure: INSERTION PORT-A-CATH;  Surgeon: [name], MD;  Location: [facility];  Service: General;  Laterality: Left;   PORTACATH PLACEMENT Left 11/04/2023   Procedure: INSERTION, TUNNELED CENTRAL VENOUS DEVICE, WITH PORT;  Surgeon: [name], MD;  Location: [facility];  Service: General;  Laterality: Left;  PORT PLACEMENT WITH ULTRASOUND GUIDANCE   SENTINEL NODE BIOPSY Right 01/28/2022   Procedure: RIGHT SENTINEL LYMPH NODE BIOPSY;  Surgeon: [name], MD;  Location: [facility];  Service: General;  Laterality: Right;   TONSILLECTOMY      I have reviewed the social history and family history with the patient and they are unchanged from previous note.  ALLERGIES:  is allergic to nortriptyline , singulair  [montelukast  sodium], sulfonamide derivatives, amitriptyline  hcl, esomeprazole, fluticasone , gabapentin , lexapro [escitalopram], neomycin, paclitaxel , pseudoephedrine, and neosporin [bacitracin-polymyxin b].  MEDICATIONS:  Current Outpatient Medications  Medication Sig Dispense Refill   nystatin  (MYCOSTATIN ) 100000 UNIT/ML suspension Take 5 mLs (500,000 Units total) by mouth 4 (four) times daily. 473 mL 0   acetaminophen  (TYLENOL ) 325 MG tablet Take 650 mg by mouth every 6 (six) hours as needed.     ALPRAZolam  (XANAX ) 0.5 MG tablet Take 1 tablet (0.5 mg total) by mouth daily as needed for anxiety. 30 tablet 1   atorvastatin (LIPITOR) 20 MG tablet Take 20 mg by  mouth every evening.     betamethasone dipropionate 0.05 % cream Apply 1 Application topically 2 (two) times daily as needed (skin irritation.).     Calcium  Carb-Cholecalciferol (CALCIUM -VITAMIN D3) 250-125 MG-UNIT TABS Take 1 tablet by mouth daily with lunch.     Cholecalciferol (VITAMIN D3) 50 MCG (2000 UT) TABS Take 2,000  Units by mouth daily with lunch.     Clobetasol  Prop Emollient Base 0.05 % emollient cream Apply 1 Application topically 2 (two) times daily. 60 g 1   ezetimibe (ZETIA) 10 MG tablet Take 10 mg by mouth every evening.     famotidine  (PEPCID ) 20 MG tablet Take 1 tablet (20 mg total) by mouth daily. (Patient taking differently: Take 20 mg by mouth daily as needed for heartburn or indigestion.)     Fezolinetant (VEOZAH) 45 MG TABS Take 45 mg by mouth in the morning. (Patient not taking: Reported on 11/26/2023)     fluconazole  (DIFLUCAN ) 150 MG tablet Take 1 tablet po once weekly for thrush 4 tablet 1   HYDROcodone -acetaminophen  (NORCO/VICODIN) 5-325 MG tablet Take 1 tablet by mouth every 6 (six) hours as needed for moderate pain (pain score 4-6). 15 tablet 0   lidocaine -prilocaine  (EMLA ) cream Apply to affected area once 30 g 3   loperamide (IMODIUM) 2 MG capsule Take by mouth as needed for diarrhea or loose stools.     magic mouthwash (nystatin , diphenhydrAMINE , alum & mag hydroxide) suspension mixture Swish and spit 5 mLs 4 (four) times daily as needed for mouth pain. 140 mL 1   nystatin -triamcinolone (MYCOLOG II) cream Apply 1 Application topically 2 (two) times daily as needed (irritation).     ondansetron  (ZOFRAN ) 8 MG tablet Take 1 tablet (8 mg total) by mouth every 8 (eight) hours as needed for nausea or vomiting. Start on the third day after chemotherapy. 30 tablet 1   pantoprazole  (PROTONIX ) 40 MG tablet Take 1 tablet (40 mg total) by mouth 2 (two) times daily. 60 tablet 2   prochlorperazine  (COMPAZINE ) 10 MG tablet Take 1 tablet (10 mg total) by mouth every 6 (six) hours as needed for nausea or vomiting. 30 tablet 1   sertraline  (ZOLOFT ) 50 MG tablet Take 50 mg by mouth in the morning.     tacrolimus (PROTOPIC) 0.03 % ointment Apply 1 Application topically 2 (two) times daily as needed (skin irritation.).     TIROSINT  100 MCG CAPS Take 100 mcg by mouth See admin instructions. Take 1 capsule (100  mcg) by mouth every 3 days, alternating with (Tirosint  88 mcg)--take on an empty stomach.     TIROSINT  88 MCG CAPS Take 88 mcg by mouth See admin instructions. Take 1 capsule (88 mcg) by mouth 2 days in a row, alternating with Tirosint  (100 mcg) on the 3 rd day.     triamcinolone ointment (KENALOG) 0.1 % Apply 1 Application topically 2 (two) times daily as needed (skin irritation.).     valACYclovir (VALTREX) 500 MG tablet Take 500 mg by mouth 2 (two) times daily as needed (cold sore/fever blisters).     No current facility-administered medications for this visit.   Facility-Administered Medications Ordered in Other Visits  Medication Dose Route Frequency Provider Last Rate Last Admin   0.9 %  sodium chloride  infusion   Intravenous Continuous [name], MD   Stopped at 01/07/24 1320    PHYSICAL EXAMINATION: ECOG PERFORMANCE STATUS: 2 - Symptomatic, <50% confined to bed  Vitals:   01/07/24 1031  BP: 128/78  Pulse: 95  Resp: 18  Temp: (!) 97.2 F (36.2 C)  SpO2: 97%   Wt Readings from Last 3 Encounters:  01/07/24 158 lb 11.2 oz (72 kg)  01/01/24 159 lb 1.6 oz (72.2 kg)  12/24/23 161 lb 14.4 oz (73.4 kg)     GENERAL:alert, no distress and comfortable SKIN: skin color, texture, turgor are normal, no rashes or significant lesions EYES: normal, Conjunctiva are pink and non-injected, sclera clear NECK: supple, thyroid  normal size, non-tender, without nodularity LYMPH:  no palpable lymphadenopathy in the cervical, axillary  LUNGS: clear to auscultation and percussion with normal breathing effort HEART: regular rate & rhythm and no murmurs and no lower extremity edema ABDOMEN:abdomen soft, non-tender and normal bowel sounds Musculoskeletal:no cyanosis of digits and no clubbing  NEURO: alert & oriented x 3 with fluent speech, no focal motor/sensory deficits  Physical Exam NEUROLOGICAL: Mild to moderate neuropathy in hands and feet.  LABORATORY DATA:  I have reviewed the data as  listed    Latest Ref Rng & Units 01/07/2024   10:11 AM 01/01/2024   11:08 AM 12/24/2023    9:19 AM  CBC  WBC 4.0 - 10.5 K/uL 1.4  2.3  2.9   Hemoglobin 12.0 - 15.0 g/dL 9.7  89.8  89.5   Hematocrit 36.0 - 46.0 % 29.0  30.4  31.0   Platelets 150 - 400 K/uL 150  195  149         Latest Ref Rng & Units 01/07/2024   10:11 AM 01/01/2024   11:08 AM 12/24/2023    9:19 AM  CMP  Glucose 70 - 99 mg/dL 884  844  856   BUN 8 - 23 mg/dL 12  12  11    Creatinine 0.44 - 1.00 mg/dL 9.46  9.52  9.42   Sodium 135 - 145 mmol/L 140  140  141   Potassium 3.5 - 5.1 mmol/L 3.9  3.7  3.7   Chloride 98 - 111 mmol/L 105  104  105   CO2 22 - 32 mmol/L 30  30  30    Calcium  8.9 - 10.3 mg/dL 9.4  9.6  9.4   Total Protein 6.5 - 8.1 g/dL 7.0  7.3  7.1   Total Bilirubin 0.0 - 1.2 mg/dL 0.4  0.3  0.3   Alkaline Phos 38 - 126 U/L 97  98  101   AST 15 - 41 U/L 25  23  26    ALT 0 - 44 U/L 33  32  47       RADIOGRAPHIC STUDIES: I have personally reviewed the radiological images as listed and agreed with the findings in the report. No results found.    No orders of the defined types were placed in this encounter.  All questions were answered. The patient knows to call the clinic with any problems, questions or concerns. No barriers to learning was detected. The total time spent in the appointment was 40 minutes, including review of chart and various tests results, discussions about plan of care and coordination of care plan     Onita Mattock, MD 01/07/2024

## 2024-01-08 ENCOUNTER — Inpatient Hospital Stay

## 2024-01-08 ENCOUNTER — Other Ambulatory Visit: Payer: Self-pay

## 2024-01-08 ENCOUNTER — Ambulatory Visit

## 2024-01-08 VITALS — BP 124/70 | HR 97 | Temp 98.7°F | Resp 18

## 2024-01-08 DIAGNOSIS — Z171 Estrogen receptor negative status [ER-]: Secondary | ICD-10-CM

## 2024-01-08 DIAGNOSIS — Z5112 Encounter for antineoplastic immunotherapy: Secondary | ICD-10-CM | POA: Diagnosis not present

## 2024-01-08 MED ORDER — FILGRASTIM-SNDZ 480 MCG/0.8ML IJ SOSY
480.0000 ug | PREFILLED_SYRINGE | Freq: Once | INTRAMUSCULAR | Status: AC
  Administered 2024-01-08: 480 ug via SUBCUTANEOUS
  Filled 2024-01-08: qty 0.8

## 2024-01-09 ENCOUNTER — Inpatient Hospital Stay

## 2024-01-09 ENCOUNTER — Encounter: Payer: Self-pay | Admitting: Hematology

## 2024-01-09 ENCOUNTER — Other Ambulatory Visit (HOSPITAL_COMMUNITY): Payer: Self-pay

## 2024-01-09 ENCOUNTER — Ambulatory Visit

## 2024-01-09 VITALS — BP 130/66 | HR 108 | Temp 97.5°F | Resp 16

## 2024-01-09 DIAGNOSIS — Z5112 Encounter for antineoplastic immunotherapy: Secondary | ICD-10-CM | POA: Diagnosis not present

## 2024-01-09 DIAGNOSIS — C50411 Malignant neoplasm of upper-outer quadrant of right female breast: Secondary | ICD-10-CM

## 2024-01-09 MED ORDER — FILGRASTIM-SNDZ 480 MCG/0.8ML IJ SOSY
480.0000 ug | PREFILLED_SYRINGE | Freq: Once | INTRAMUSCULAR | Status: AC
  Administered 2024-01-09: 480 ug via SUBCUTANEOUS
  Filled 2024-01-09: qty 0.8

## 2024-01-11 ENCOUNTER — Encounter: Payer: Self-pay | Admitting: Hematology

## 2024-01-11 ENCOUNTER — Inpatient Hospital Stay

## 2024-01-11 ENCOUNTER — Ambulatory Visit (HOSPITAL_COMMUNITY)
Admission: RE | Admit: 2024-01-11 | Discharge: 2024-01-11 | Disposition: A | Discharge status: Home / Self Care | Source: Ambulatory Visit | Attending: Nurse Practitioner | Admitting: Nurse Practitioner

## 2024-01-11 ENCOUNTER — Other Ambulatory Visit (HOSPITAL_COMMUNITY): Payer: Self-pay

## 2024-01-11 ENCOUNTER — Ambulatory Visit

## 2024-01-11 ENCOUNTER — Telehealth: Payer: Self-pay | Admitting: Hematology

## 2024-01-11 VITALS — BP 100/74 | HR 111 | Temp 98.2°F | Resp 17

## 2024-01-11 DIAGNOSIS — R918 Other nonspecific abnormal finding of lung field: Secondary | ICD-10-CM | POA: Diagnosis not present

## 2024-01-11 DIAGNOSIS — Z171 Estrogen receptor negative status [ER-]: Secondary | ICD-10-CM | POA: Insufficient documentation

## 2024-01-11 DIAGNOSIS — Z5112 Encounter for antineoplastic immunotherapy: Secondary | ICD-10-CM | POA: Diagnosis not present

## 2024-01-11 DIAGNOSIS — C50411 Malignant neoplasm of upper-outer quadrant of right female breast: Secondary | ICD-10-CM | POA: Insufficient documentation

## 2024-01-11 DIAGNOSIS — C50912 Malignant neoplasm of unspecified site of left female breast: Secondary | ICD-10-CM | POA: Diagnosis not present

## 2024-01-11 MED ORDER — FILGRASTIM-SNDZ 480 MCG/0.8ML IJ SOSY
480.0000 ug | PREFILLED_SYRINGE | Freq: Once | INTRAMUSCULAR | Status: AC
  Administered 2024-01-11: 480 ug via SUBCUTANEOUS
  Filled 2024-01-11: qty 0.8

## 2024-01-11 MED ORDER — IOHEXOL 300 MG/ML  SOLN
100.0000 mL | Freq: Once | INTRAMUSCULAR | Status: AC | PRN
Start: 2024-01-11 — End: 2024-01-11
  Administered 2024-01-11: 100 mL via INTRAVENOUS

## 2024-01-11 NOTE — Telephone Encounter (Signed)
 Samantha [name] called in to re-schedule her injection appointment

## 2024-01-12 ENCOUNTER — Encounter: Payer: Self-pay | Admitting: Hematology

## 2024-01-12 ENCOUNTER — Encounter: Payer: Self-pay | Admitting: Nurse Practitioner

## 2024-01-12 NOTE — Assessment & Plan Note (Signed)
 IDC and DCIS, Stage IA, pT1b, cN0, triple negative, Grade 3, chest wall recurrence in 10/2023  -found on screening mammogram. S/p lumpectomy on 01/08/22 with Dr. Aron, path showed: 8 mm invasive and in situ ductal carcinoma with negative margins. Repeat prognostic panel confirmed triple negative disease.  -lymph node biopsies 01/28/22 showed negative nodes (0/2). Port placed at time of procedure. Postoperative course complicated by pneumothorax, which has resolved. -she began adjuvant TC on 02/20/22, and completed planned 4 cycles on 04/24/2022. -she completed adjuvant RT on 07/09/2022  -biopsy confirmed chest lymph nodes (axilla and chest wall including posteriad pectoralis muscle)  recurrence in 10/2023 - Case reviewed in breast tumor board, plan to start neoadjuvant chemotherapy with carboplatin  and paclitaxel  every 3 weeks for 4 cycles, followed by AC every 3 weeks for 4 cycles, along with immunotherapy Keytruda  for 1 year.  If she has good response to treatment, Dr. Aron will consider surgery although she is not able to remove the aubpectoral lymph node.  She may need adjuvant radiation after surgery.

## 2024-01-12 NOTE — Assessment & Plan Note (Signed)
-  diagnosed in 12/05/2022, ER-/PR-, g2 -s/p left lumpectomy by Dr. Donell Beers on January 01, 2023.  Surgical path showed intermediate grade DCIS with necrosis, margins were negative. -She has completed adjuvant radiation -No role for adjuvant antiestrogen therapy -Continue breast cancer surveillance

## 2024-01-13 ENCOUNTER — Other Ambulatory Visit: Payer: Self-pay

## 2024-01-13 ENCOUNTER — Inpatient Hospital Stay (HOSPITAL_BASED_OUTPATIENT_CLINIC_OR_DEPARTMENT_OTHER): Admitting: Hematology

## 2024-01-13 ENCOUNTER — Telehealth: Payer: Self-pay

## 2024-01-13 ENCOUNTER — Inpatient Hospital Stay
Admission: RE | Admit: 2024-01-13 | Discharge: 2024-01-13 | Disposition: A | Discharge status: Home / Self Care | Payer: Self-pay | Source: Ambulatory Visit | Attending: Hematology | Admitting: Hematology

## 2024-01-13 ENCOUNTER — Inpatient Hospital Stay
Admission: RE | Admit: 2024-01-13 | Discharge: 2024-01-13 | Disposition: A | Payer: Self-pay | Source: Ambulatory Visit | Attending: Hematology

## 2024-01-13 ENCOUNTER — Inpatient Hospital Stay

## 2024-01-13 ENCOUNTER — Inpatient Hospital Stay: Attending: Hematology

## 2024-01-13 VITALS — BP 112/62 | HR 94 | Temp 97.8°F | Resp 16 | Ht 67.0 in | Wt 158.0 lb

## 2024-01-13 DIAGNOSIS — E039 Hypothyroidism, unspecified: Secondary | ICD-10-CM | POA: Diagnosis not present

## 2024-01-13 DIAGNOSIS — D6959 Other secondary thrombocytopenia: Secondary | ICD-10-CM | POA: Diagnosis not present

## 2024-01-13 DIAGNOSIS — C50411 Malignant neoplasm of upper-outer quadrant of right female breast: Secondary | ICD-10-CM | POA: Diagnosis not present

## 2024-01-13 DIAGNOSIS — Z171 Estrogen receptor negative status [ER-]: Secondary | ICD-10-CM | POA: Insufficient documentation

## 2024-01-13 DIAGNOSIS — G620 Drug-induced polyneuropathy: Secondary | ICD-10-CM | POA: Insufficient documentation

## 2024-01-13 DIAGNOSIS — Z86000 Personal history of in-situ neoplasm of breast: Secondary | ICD-10-CM | POA: Insufficient documentation

## 2024-01-13 DIAGNOSIS — T451X5A Adverse effect of antineoplastic and immunosuppressive drugs, initial encounter: Secondary | ICD-10-CM | POA: Insufficient documentation

## 2024-01-13 DIAGNOSIS — R911 Solitary pulmonary nodule: Secondary | ICD-10-CM | POA: Insufficient documentation

## 2024-01-13 DIAGNOSIS — Z1722 Progesterone receptor negative status: Secondary | ICD-10-CM | POA: Insufficient documentation

## 2024-01-13 DIAGNOSIS — N6331 Unspecified lump in axillary tail of the right breast: Secondary | ICD-10-CM | POA: Diagnosis not present

## 2024-01-13 DIAGNOSIS — Z5189 Encounter for other specified aftercare: Secondary | ICD-10-CM | POA: Diagnosis not present

## 2024-01-13 DIAGNOSIS — D701 Agranulocytosis secondary to cancer chemotherapy: Secondary | ICD-10-CM | POA: Diagnosis not present

## 2024-01-13 DIAGNOSIS — Y842 Radiological procedure and radiotherapy as the cause of abnormal reaction of the patient, or of later complication, without mention of misadventure at the time of the procedure: Secondary | ICD-10-CM | POA: Diagnosis not present

## 2024-01-13 DIAGNOSIS — Z5111 Encounter for antineoplastic chemotherapy: Secondary | ICD-10-CM | POA: Diagnosis present

## 2024-01-13 DIAGNOSIS — Z5112 Encounter for antineoplastic immunotherapy: Secondary | ICD-10-CM | POA: Insufficient documentation

## 2024-01-13 DIAGNOSIS — B370 Candidal stomatitis: Secondary | ICD-10-CM | POA: Diagnosis not present

## 2024-01-13 DIAGNOSIS — Z1732 Human epidermal growth factor receptor 2 negative status: Secondary | ICD-10-CM | POA: Insufficient documentation

## 2024-01-13 DIAGNOSIS — Z9089 Acquired absence of other organs: Secondary | ICD-10-CM | POA: Insufficient documentation

## 2024-01-13 DIAGNOSIS — R400 Somnolence: Secondary | ICD-10-CM | POA: Diagnosis not present

## 2024-01-13 DIAGNOSIS — K5900 Constipation, unspecified: Secondary | ICD-10-CM | POA: Insufficient documentation

## 2024-01-13 DIAGNOSIS — R110 Nausea: Secondary | ICD-10-CM | POA: Diagnosis not present

## 2024-01-13 DIAGNOSIS — D6481 Anemia due to antineoplastic chemotherapy: Secondary | ICD-10-CM | POA: Diagnosis not present

## 2024-01-13 DIAGNOSIS — D0512 Intraductal carcinoma in situ of left breast: Secondary | ICD-10-CM

## 2024-01-13 DIAGNOSIS — Z881 Allergy status to other antibiotic agents status: Secondary | ICD-10-CM | POA: Insufficient documentation

## 2024-01-13 DIAGNOSIS — Z882 Allergy status to sulfonamides status: Secondary | ICD-10-CM | POA: Insufficient documentation

## 2024-01-13 DIAGNOSIS — R5383 Other fatigue: Secondary | ICD-10-CM | POA: Diagnosis not present

## 2024-01-13 DIAGNOSIS — Z923 Personal history of irradiation: Secondary | ICD-10-CM | POA: Diagnosis not present

## 2024-01-13 DIAGNOSIS — Z79890 Hormone replacement therapy (postmenopausal): Secondary | ICD-10-CM | POA: Diagnosis not present

## 2024-01-13 DIAGNOSIS — Z79899 Other long term (current) drug therapy: Secondary | ICD-10-CM | POA: Diagnosis not present

## 2024-01-13 DIAGNOSIS — Z888 Allergy status to other drugs, medicaments and biological substances status: Secondary | ICD-10-CM | POA: Insufficient documentation

## 2024-01-13 LAB — CMP (CANCER CENTER ONLY)
ALT: 28 U/L (ref 0–44)
AST: 20 U/L (ref 15–41)
Albumin: 4.4 g/dL (ref 3.5–5.0)
Alkaline Phosphatase: 107 U/L (ref 38–126)
Anion gap: 7 (ref 5–15)
BUN: 11 mg/dL (ref 8–23)
CO2: 28 mmol/L (ref 22–32)
Calcium: 9.3 mg/dL (ref 8.9–10.3)
Chloride: 105 mmol/L (ref 98–111)
Creatinine: 0.53 mg/dL (ref 0.44–1.00)
GFR, Estimated: >60 mL/min (ref >60)
Glucose, Bld: 153 mg/dL — ABNORMAL HIGH (ref 70–99)
Potassium: 3.6 mmol/L (ref 3.5–5.1)
Sodium: 140 mmol/L (ref 135–145)
Total Bilirubin: 0.4 mg/dL (ref 0.0–1.2)
Total Protein: 6.9 g/dL (ref 6.5–8.1)

## 2024-01-13 LAB — CBC WITH DIFFERENTIAL (CANCER CENTER ONLY)
Abs Immature Granulocytes: 0.07 K/uL (ref 0.00–0.07)
Basophils Absolute: 0 K/uL (ref 0.0–0.1)
Basophils Relative: 1 %
Eosinophils Absolute: 0 K/uL (ref 0.0–0.5)
Eosinophils Relative: 0 %
HCT: 28.3 % — ABNORMAL LOW (ref 36.0–46.0)
Hemoglobin: 9.6 g/dL — ABNORMAL LOW (ref 12.0–15.0)
Immature Granulocytes: 1 %
Lymphocytes Relative: 13 %
Lymphs Abs: 0.7 K/uL (ref 0.7–4.0)
MCH: 33.3 pg (ref 26.0–34.0)
MCHC: 33.9 g/dL (ref 30.0–36.0)
MCV: 98.3 fL (ref 80.0–100.0)
Monocytes Absolute: 0.6 K/uL (ref 0.1–1.0)
Monocytes Relative: 10 %
Neutro Abs: 4.2 K/uL (ref 1.7–7.7)
Neutrophils Relative %: 75 %
Platelet Count: 90 K/uL — ABNORMAL LOW (ref 150–400)
RBC: 2.88 MIL/uL — ABNORMAL LOW (ref 3.87–5.11)
RDW: 18.1 % — ABNORMAL HIGH (ref 11.5–15.5)
Smear Review: NORMAL
WBC Count: 5.6 K/uL (ref 4.0–10.5)
nRBC: 0 % (ref 0.0–0.2)

## 2024-01-13 LAB — T4, FREE: Free T4: 1.01 ng/dL (ref 0.61–1.12)

## 2024-01-13 MED ORDER — SODIUM CHLORIDE 0.9 % IV SOLN
457.2000 mg | Freq: Once | INTRAVENOUS | Status: AC
  Administered 2024-01-13: 460 mg via INTRAVENOUS
  Filled 2024-01-13: qty 46

## 2024-01-13 MED ORDER — PACLITAXEL PROTEIN-BOUND CHEMO INJECTION 100 MG
60.0000 mg/m2 | Freq: Once | INTRAVENOUS | Status: AC
  Administered 2024-01-13: 125 mg via INTRAVENOUS
  Filled 2024-01-13: qty 25

## 2024-01-13 MED ORDER — APREPITANT 130 MG/18ML IV EMUL
130.0000 mg | Freq: Once | INTRAVENOUS | Status: AC
  Administered 2024-01-13: 130 mg via INTRAVENOUS
  Filled 2024-01-13: qty 18

## 2024-01-13 MED ORDER — CETIRIZINE HCL 10 MG PO TABS
10.0000 mg | ORAL_TABLET | Freq: Once | ORAL | Status: AC
  Administered 2024-01-13: 10 mg via ORAL
  Filled 2024-01-13: qty 1

## 2024-01-13 MED ORDER — SODIUM CHLORIDE 0.9 % IV SOLN: INTRAVENOUS | Status: AC

## 2024-01-13 MED ORDER — SODIUM CHLORIDE 0.9 % IV SOLN
200.0000 mg | Freq: Once | INTRAVENOUS | Status: AC
  Administered 2024-01-13: 200 mg via INTRAVENOUS
  Filled 2024-01-13: qty 200

## 2024-01-13 MED ORDER — PALONOSETRON HCL INJECTION 0.25 MG/5ML
0.2500 mg | Freq: Once | INTRAVENOUS | Status: AC
  Administered 2024-01-13: 0.25 mg via INTRAVENOUS
  Filled 2024-01-13: qty 5

## 2024-01-13 NOTE — Progress Notes (Signed)
 OK to proceed with Abraxane  125mg  (11% difference than calculated dose)

## 2024-01-13 NOTE — Patient Instructions (Signed)
 [facility] - A DEPT OF [facility]  Discharge Instructions: Thank you for choosing [facility] to provide your oncology and hematology care.   If you have a lab appointment with the Cancer Center, please go directly to the Cancer Center and check in at the registration area.   Wear comfortable clothing and clothing appropriate for easy access to any Portacath or PICC line.   We strive to give you quality time with your provider. You may need to reschedule your appointment if you arrive late (15 or more minutes).  Arriving late affects you and other patients whose appointments are after yours.  Also, if you miss three or more appointments without notifying the office, you may be dismissed from the clinic at the provider's discretion.      For prescription refill requests, have your pharmacy contact our office and allow 72 hours for refills to be completed.    Today you received the following chemotherapy and/or immunotherapy agents: PACLitaxel -protein bound (ABRAXANE ), Pembrolizumab  (200) D1 + Carboplatin  (5) D1    To help prevent nausea and vomiting after your treatment, we encourage you to take your nausea medication as directed.  BELOW ARE SYMPTOMS THAT SHOULD BE REPORTED IMMEDIATELY: *FEVER GREATER THAN 100.4 F (38 C) OR HIGHER *CHILLS OR SWEATING *NAUSEA AND VOMITING THAT IS NOT CONTROLLED WITH YOUR NAUSEA MEDICATION *UNUSUAL SHORTNESS OF BREATH *UNUSUAL BRUISING OR BLEEDING *URINARY PROBLEMS (pain or burning when urinating, or frequent urination) *BOWEL PROBLEMS (unusual diarrhea, constipation, pain near the anus) TENDERNESS IN MOUTH AND THROAT WITH OR WITHOUT PRESENCE OF ULCERS (sore throat, sores in mouth, or a toothache) UNUSUAL RASH, SWELLING OR PAIN  UNUSUAL VAGINAL DISCHARGE OR ITCHING   Items with * indicate a potential emergency and should be followed up as soon as possible or go to the Emergency Department if any problems  should occur.  Please show the CHEMOTHERAPY ALERT CARD or IMMUNOTHERAPY ALERT CARD at check-in to the Emergency Department and triage nurse.  Should you have questions after your visit or need to cancel or reschedule your appointment, please contact [facility] - A DEPT OF [facility]  Dept: [phone]  and follow the prompts.  Office hours are 8:00 a.m. to 4:30 p.m. Monday - Friday. Please note that voicemails left after 4:00 p.m. may not be returned until the following business day.  We are closed weekends and major holidays. You have access to a nurse at all times for urgent questions. Please call the main number to the clinic Dept: [phone] and follow the prompts.   For any non-urgent questions, you may also contact your provider using MyChart. We now offer e-Visits for anyone 2 and older to request care online for non-urgent symptoms. For details visit [email or web address].   Also download the MyChart app! Go to the app store, search MyChart, open the app, select [facility], and log in with your MyChart username and password.

## 2024-01-13 NOTE — Telephone Encounter (Signed)
 Contacted [facility] [phone] to request patient's CT scans to be sent to [facility]. Contacted [facility] [phone] requested for the CT scans to be pulled once they are received. Placed orders for outside films (3) as advised x [facility]. Awaiting outside films.

## 2024-01-14 ENCOUNTER — Ambulatory Visit: Admitting: Cardiology

## 2024-01-14 ENCOUNTER — Encounter: Payer: Self-pay | Admitting: Hematology

## 2024-01-14 LAB — T3: T3, Total: 68 ng/dL — ABNORMAL LOW (ref 71–180)

## 2024-01-14 NOTE — Progress Notes (Signed)
 Sam[name]   [phone] [phone]   Clinic Follow up Note   Patient Care Team: Samantha Anes, MD as PCP - General (Internal Medicine) Samantha Nanetta SAILOR, RN as Oncology Nurse Navigator Samantha Shoulders, MD as Consulting Physician (General Surgery) Samantha Callander, MD as Consulting Physician (Hematology) Samantha Rush, MD as Consulting Physician (Radiation Oncology) Samantha Norman RAMAN, MD as Consulting Physician (Endocrinology) Samantha Seltzer, MD as Consulting Physician (Sports Medicine) Samantha Medford, MD as Consulting Physician (Obstetrics and Gynecology) [name], [name], NP as Nurse Practitioner (Nurse Practitioner) Imaging, [name] as Radiologist (Diagnostic Radiology)  Date of Service:  01/13/2024  CHIEF COMPLAINT: f/u of recurrent breast cancer  CURRENT THERAPY:  Carboplatin , paclitaxel  and Keytruda   Oncology History   Ductal carcinoma in situ (DCIS) of left breast -diagnosed in 12/05/2022, ER-/PR-, g2 -s/p left lumpectomy by Dr. Aron on January 01, 2023.  Surgical path showed intermediate grade DCIS with necrosis, margins were negative. -She has completed adjuvant radiation -No role for adjuvant antiestrogen therapy -Continue breast cancer surveillance  Malignant neoplasm of upper-outer quadrant of right breast in female, estrogen receptor negative (HCC) IDC and DCIS, Stage IA, pT1b, cN0, triple negative, Grade 3, chest wall recurrence in 10/2023  -found on screening mammogram. S/p lumpectomy on 01/08/22 with Dr. Aron, path showed: 8 mm invasive and in situ ductal carcinoma with negative margins. Repeat prognostic panel confirmed triple negative disease.  -lymph node biopsies 01/28/22 showed negative nodes (0/2). Port placed at time of procedure. Postoperative course complicated by pneumothorax, which has resolved. -she began adjuvant TC on 02/20/22, and completed planned 4 cycles on 04/24/2022. -she completed adjuvant RT on  07/09/2022  -biopsy confirmed chest lymph nodes (axilla and chest wall including posteriad pectoralis muscle)  recurrence in 10/2023 - Case reviewed in breast tumor board, plan to start neoadjuvant chemotherapy with carboplatin  and paclitaxel  every 3 weeks for 4 cycles, followed by AC every 3 weeks for 4 cycles, along with immunotherapy Keytruda  for 1 year.  If she has good response to treatment, Dr. Aron will consider surgery although she is not able to remove the aubpectoral lymph node.  She may need adjuvant radiation after surgery.  Assessment & Plan Right breast cancer with lymph node involvement Currently on chemotherapy. Recent CT scan shows normal lymph nodes, but PET scan indicates three hypermetabolic lymph nodes on the right chest wall. No significant enlarged lymph nodes in the abdomen or pelvis. - Continue current chemotherapy regimen with immunotherapy and carboplatin /paclitaxel . - Switch to Adriamycin and Cytoxan  after current regimen. - Order PET scan in two months to evaluate lymph nodes and disease progression. - Schedule echocardiogram before starting Adriamycin. - Provide extra hydration during chemotherapy sessions.  Chemotherapy-induced anemia and thrombocytopenia Hemoglobin is 9.6, and platelet count is 90. Blood counts are expected to drop further. - Plan a one-week break after the current chemotherapy cycle to allow blood count recovery.  Chemotherapy-induced peripheral neuropathy Experiencing peripheral neuropathy, likely due to paclitaxel . Fingertips are sore. - Continue using ice during chemotherapy sessions to manage neuropathy.  Constipation secondary to antiemetic therapy Constipation secondary to Zofran . Constipation has improved after discontinuing Zofran . - Discontinue Zofran  and switch to Compazine  for antiemetic therapy. - Monitor for constipation and manage as needed.  Left breast ductal carcinoma in situ, post-surgical and post-radiation  surveillance Recent CT scan shows inflammatory changes in the left lung likely related to previous radiation therapy. - Continue post-surgical and post-radiation surveillance.  Radiation-induced pulmonary changes Radiation-induced pulmonary changes in  the left lung, likely related to previous radiation therapy. No immediate intervention required. - Continue monitoring for radiation-induced pulmonary changes.  Pulmonary nodule under surveillance Recent CT scan shows a nodule in the lung, which has not increased in size. Differential includes inflammatory change, infection, or chronic inflammation. - Order PET scan in two months to reassess pulmonary nodule.  Hypothyroidism on levothyroxine  Recent labs show slightly elevated TSH but normal free T4. - Continue current levothyroxine  dosage. - Monitor thyroid  function tests and refer to endocrinologist if TSH levels worsen.   Plan - I personally reviewed her CT scan from January 11, 2024, which showed a partial response, and a new 1.1 cm nodule in the right lower lobe lung, plan to repeat PET scan in 2 months.  -Labs reviewed, adequate for treatment, will proceed cycle 4 carboplatin , paclitaxel  and Keytruda  today with dose reduction - He will return next 2 weeks for chemo - Will postpone for cycle AC for week allow her to recover better   SUMMARY OF ONCOLOGIC HISTORY: Oncology History Overview Note   Cancer Staging  Malignant neoplasm of upper-outer quadrant of right breast in female, estrogen receptor negative Staging form: Breast, AJCC 8th Edition - Clinical stage from 12/23/2021: cT1b, cM0, G3, ER-, PR-, HER2- - Unsigned Stage prefix: Initial diagnosis Histologic grading system: 3 grade system - Pathologic stage from 01/08/2022: Stage IB (pT1b, pN0, cM0, G3, ER-, PR-, HER2-) - Signed by Samantha Callander, MD on 01/24/2022 Stage prefix: Initial diagnosis Histologic grading system: 3 grade system Residual tumor (R): R0 - None     Malignant  neoplasm of upper-outer quadrant of right breast in female, estrogen receptor negative (HCC)  12/06/2021 Mammogram   CLINICAL DATA:  Patient returns today to evaluate RIGHT breast calcifications identified on a recent screening mammogram.   EXAM: DIGITAL DIAGNOSTIC UNILATERAL RIGHT MAMMOGRAM  IMPRESSION: Grouped coarse heterogeneous calcifications within the upper-outer quadrant of the RIGHT breast, measuring 6 mm extent. These may be fibroadenomatous calcifications. Stereotactic biopsy is recommended to exclude malignancy.   12/18/2021 Initial Biopsy   Diagnosis Breast, right, needle core biopsy, upper outer quadrant, x clip - DUCTAL CARCINOMA IN SITU, SOLID AND CRIBRIFORM TYPE WITH COMEDONECROSIS AND ASSOCIATED CALCIFICATIONS, NUCLEAR GRADE 3 OF 3 - FOCAL MICROINVASION IS PRESENT (LESS THAN 1 MM) WITH EVIDENCE OF LYMPHOVASCULAR INVASION - NECROSIS: PRESENT - CALCIFICATIONS: PRESENT - DCIS LENGTH: 7 MM IN GREATEST LINEAR DIMENSION ON FRAGMENTED CORES  PROGNOSTIC INDICATORS Results: IMMUNOHISTOCHEMICAL AND MORPHOMETRIC ANALYSIS PERFORMED MANUALLY The tumor cells are NEGATIVE for Her2 (1+). Estrogen Receptor: 0%, NEGATIVE Progesterone Receptor: 0%, NEGATIVE Proliferation Marker Ki67: 60%   12/23/2021 Initial Diagnosis   Malignant neoplasm of upper-outer quadrant of right breast in female, estrogen receptor negative (HCC)   12/23/2021 Imaging   EXAM: ULTRASOUND OF THE RIGHT AXILLA  IMPRESSION: No abnormal appearing RIGHT axillary lymph nodes.   01/08/2022 Cancer Staging   Staging form: Breast, AJCC 8th Edition - Pathologic stage from 01/08/2022: Stage IB (pT1b, pN0, cM0, G3, ER-, PR-, HER2-) - Signed by Samantha Callander, MD on 01/24/2022 Stage prefix: Initial diagnosis Histologic grading system: 3 grade system Residual tumor (R): R0 - None   02/20/2022 - 04/24/2022 Chemotherapy   Patient is on Treatment Plan : BREAST TC q21d     10/06/2022 Survivorship   SCP delivered by Lacie [name],  NP   11/13/2023 -  Chemotherapy   Patient is on Treatment Plan : BREAST Pembrolizumab  (200) D1 + Carboplatin  (5) D1 + Paclitaxel  (80) D1,8,15 q21d X 4 cycles / Pembrolizumab  (  200) D1 + AC D1 q21d x 4 cycles     Ductal carcinoma in situ (DCIS) of left breast  12/09/2022 Initial Diagnosis   Ductal carcinoma in situ (DCIS) of left breast   01/01/2023 Surgery   Let breast seed localized lumpectomy.with Dr. Aron  2.5 cm DCIS with negative margins.     02/11/2023 - 03/10/2023 Radiation Therapy   Dose per fraction - 2.66 Gy Prescribed dose (delivered/prescribed)  42.56/42.56 Prescribed Fxs (delivered/prescribed)  16/16  Boost  Dose per fraction -  2Gy Prescribed dose (delivered/prescribed) 8Gy/8Gy) Prescribed Fxs (delivered/prescribed) (4Gy/4Gy)        Discussed the use of AI scribe software for clinical note transcription with the patient, who gave verbal consent to proceed.  History of Present Illness Samantha [name] is a 63 year old female with breast cancer who presents for follow-up.  Her breast cancer was initially diagnosed in the right breast in 2023 and in the left breast in August 2024, with a relapse in October 2024. She underwent surgery in September 2024 and completed radiation therapy by November 2024.  A nodule in the lung, noted on a previous scan, has not increased in size. CT scans in February and June 2025 showed a left lower lobe perihilar nodule and ground glass opacities. She is concerned about inflammatory changes in her lungs.  She is currently undergoing chemotherapy. Zofran  caused significant constipation, so she switched to Compazine  for nausea management. Her regimen includes Aloxi , which lasts for three days. She experiences sore fingertips, attributed to neuropathy from Taxol , and uses ice during chemotherapy to manage this side effect.  Her blood counts have improved after receiving injections last week, with hemoglobin at 9.6 and platelet count at 90. She  is on Synthroid  for thyroid  management, with slightly elevated TSH levels, but normal free T4.     All other systems were reviewed with the patient and are negative.  MEDICAL HISTORY:  Past Medical History:  Diagnosis Date   Allergy    Anxiety 2015   Result of culmination of super stressful caregiving and deaths of 2 immediate family members.  Overall do pretty well.   Cancer [name]) 2023   right breast   Coronary artery calcification    Depression    denies   GERD (gastroesophageal reflux disease)    Hyperlipidemia    Hypothyroidism    Right carpal tunnel syndrome 09/19/2019   Thyroid  disease     SURGICAL HISTORY: Past Surgical History:  Procedure Laterality Date   BREAST BIOPSY Left 12/04/2022   MM LT BREAST BX W LOC DEV 1ST LESION IMAGE BX SPEC STEREO GUIDE 12/04/2022 [facility]   BREAST BIOPSY  12/30/2022   MM LT RADIOACTIVE SEED LOC MAMMO GUIDE 12/30/2022 [facility]   BREAST LUMPECTOMY WITH RADIOACTIVE SEED LOCALIZATION Right 01/08/2022   Procedure: RIGHT BREAST LUMPECTOMY WITH RADIOACTIVE SEED LOCALIZATION;  Surgeon: Samantha Shoulders, MD;  Location: [facility];  Service: General;  Laterality: Right;   BREAST LUMPECTOMY WITH RADIOACTIVE SEED LOCALIZATION Left 01/01/2023   Procedure: LEFT BREAST SEED LOCALIZED LUMPECTOMY;  Surgeon: Samantha Shoulders, MD;  Location: [facility] OR;  Service: General;  Laterality: Left;   PORT-A-CATH REMOVAL Left 05/20/2022   Procedure: REMOVAL PORT-A-CATH;  Surgeon: Samantha Shoulders, MD;  Location: [name];  Service: General;  Laterality: Left;   PORTACATH PLACEMENT Left 01/28/2022   Procedure: INSERTION PORT-A-CATH;  Surgeon: Samantha Shoulders, MD;  Location: [name];  Service: General;  Laterality: Left;   PORTACATH PLACEMENT Left  11/04/2023   Procedure: INSERTION, TUNNELED CENTRAL VENOUS DEVICE, WITH PORT;  Surgeon: Samantha Shoulders, MD;  Location: [name];  Service: General;  Laterality: Left;  PORT PLACEMENT  WITH ULTRASOUND GUIDANCE   SENTINEL NODE BIOPSY Right 01/28/2022   Procedure: RIGHT SENTINEL LYMPH NODE BIOPSY;  Surgeon: Samantha Shoulders, MD;  Location: [name];  Service: General;  Laterality: Right;   TONSILLECTOMY      I have reviewed the social history and family history with the patient and they are unchanged from previous note.  ALLERGIES:  is allergic to nortriptyline , singulair  [montelukast  sodium], sulfonamide derivatives, amitriptyline  hcl, esomeprazole, fluticasone , gabapentin , lexapro [escitalopram], neomycin, paclitaxel , pseudoephedrine, and neosporin [bacitracin-polymyxin b].  MEDICATIONS:  Current Outpatient Medications  Medication Sig Dispense Refill   acetaminophen  (TYLENOL ) 325 MG tablet Take 650 mg by mouth every 6 (six) hours as needed.     ALPRAZolam  (XANAX ) 0.5 MG tablet Take 1 tablet (0.5 mg total) by mouth daily as needed for anxiety. 30 tablet 1   atorvastatin (LIPITOR) 20 MG tablet Take 20 mg by mouth every evening.     betamethasone dipropionate 0.05 % cream Apply 1 Application topically 2 (two) times daily as needed (skin irritation.).     Calcium  Carb-Cholecalciferol (CALCIUM -VITAMIN D3) 250-125 MG-UNIT TABS Take 1 tablet by mouth daily with lunch.     Cholecalciferol (VITAMIN D3) 50 MCG (2000 UT) TABS Take 2,000 Units by mouth daily with lunch.     Clobetasol  Prop Emollient Base 0.05 % emollient cream Apply 1 Application topically 2 (two) times daily. 60 g 1   ezetimibe (ZETIA) 10 MG tablet Take 10 mg by mouth every evening.     famotidine  (PEPCID ) 20 MG tablet Take 1 tablet (20 mg total) by mouth daily. (Patient taking differently: Take 20 mg by mouth daily as needed for heartburn or indigestion.)     Fezolinetant (VEOZAH) 45 MG TABS Take 45 mg by mouth in the morning. (Patient not taking: Reported on 11/26/2023)     fluconazole  (DIFLUCAN ) 150 MG tablet Take 1 tablet po once weekly for thrush 4 tablet 1   HYDROcodone -acetaminophen  (NORCO/VICODIN)  5-325 MG tablet Take 1 tablet by mouth every 6 (six) hours as needed for moderate pain (pain score 4-6). 15 tablet 0   lidocaine -prilocaine  (EMLA ) cream Apply to affected area once 30 g 3   loperamide (IMODIUM) 2 MG capsule Take by mouth as needed for diarrhea or loose stools.     magic mouthwash (nystatin , diphenhydrAMINE , alum & mag hydroxide) suspension mixture Swish and spit 5 mLs 4 (four) times daily as needed for mouth pain. 140 mL 1   nystatin  (MYCOSTATIN ) 100000 UNIT/ML suspension Take 5 mLs (500,000 Units total) by mouth 4 (four) times daily. 473 mL 0   nystatin -triamcinolone (MYCOLOG II) cream Apply 1 Application topically 2 (two) times daily as needed (irritation).     ondansetron  (ZOFRAN ) 8 MG tablet Take 1 tablet (8 mg total) by mouth every 8 (eight) hours as needed for nausea or vomiting. Start on the third day after chemotherapy. 30 tablet 1   pantoprazole  (PROTONIX ) 40 MG tablet Take 1 tablet (40 mg total) by mouth 2 (two) times daily. 60 tablet 2   prochlorperazine  (COMPAZINE ) 10 MG tablet Take 1 tablet (10 mg total) by mouth every 6 (six) hours as needed for nausea or vomiting. 30 tablet 1   sertraline  (ZOLOFT ) 50 MG tablet Take 50 mg by mouth in the morning.     tacrolimus (PROTOPIC) 0.03 % ointment  Apply 1 Application topically 2 (two) times daily as needed (skin irritation.).     TIROSINT  100 MCG CAPS Take 100 mcg by mouth See admin instructions. Take 1 capsule (100 mcg) by mouth every 3 days, alternating with (Tirosint  88 mcg)--take on an empty stomach.     TIROSINT  88 MCG CAPS Take 88 mcg by mouth See admin instructions. Take 1 capsule (88 mcg) by mouth 2 days in a row, alternating with Tirosint  (100 mcg) on the 3 rd day.     triamcinolone ointment (KENALOG) 0.1 % Apply 1 Application topically 2 (two) times daily as needed (skin irritation.).     valACYclovir (VALTREX) 500 MG tablet Take 500 mg by mouth 2 (two) times daily as needed (cold sore/fever blisters).     No current  facility-administered medications for this visit.    PHYSICAL EXAMINATION: ECOG PERFORMANCE STATUS: 2 - Symptomatic, <50% confined to bed  Vitals:   01/13/24 1105  BP: 112/62  Pulse: 94  Resp: 16  Temp: 97.8 F (36.6 C)  SpO2: 97%   Wt Readings from Last 3 Encounters:  01/13/24 158 lb (71.7 kg)  01/07/24 158 lb 11.2 oz (72 kg)  01/01/24 159 lb 1.6 oz (72.2 kg)     GENERAL:alert, no distress and comfortable SKIN: skin color, texture, turgor are normal, no rashes or significant lesions EYES: normal, Conjunctiva are pink and non-injected, sclera clear NECK: supple, thyroid  normal size, non-tender, without nodularity LYMPH:  no palpable lymphadenopathy in the cervical, axillary  LUNGS: clear to auscultation and percussion with normal breathing effort HEART: regular rate & rhythm and no murmurs and no lower extremity edema ABDOMEN:abdomen soft, non-tender and normal bowel sounds Musculoskeletal:no cyanosis of digits and no clubbing  NEURO: alert & oriented x 3 with fluent speech, no focal motor/sensory deficits  Physical Exam    LABORATORY DATA:  I have reviewed the data as listed    Latest Ref Rng & Units 01/13/2024   10:40 AM 01/07/2024   10:11 AM 01/01/2024   11:08 AM  CBC  WBC 4.0 - 10.5 [name]/uL 5.6  1.4  2.3   Hemoglobin 12.0 - 15.0 g/dL 9.6  9.7  89.8   Hematocrit 36.0 - 46.0 % 28.3  29.0  30.4   Platelets 150 - 400 [name]/uL 90  150  195         Latest Ref Rng & Units 01/13/2024   10:40 AM 01/07/2024   10:11 AM 01/01/2024   11:08 AM  CMP  Glucose 70 - 99 mg/dL 846  884  844   BUN 8 - 23 mg/dL 11  12  12    Creatinine 0.44 - 1.00 mg/dL 9.46  9.46  9.52   Sodium 135 - 145 mmol/L 140  140  140   Potassium 3.5 - 5.1 mmol/L 3.6  3.9  3.7   Chloride 98 - 111 mmol/L 105  105  104   CO2 22 - 32 mmol/L 28  30  30    Calcium  8.9 - 10.3 mg/dL 9.3  9.4  9.6   Total Protein 6.5 - 8.1 g/dL 6.9  7.0  7.3   Total Bilirubin 0.0 - 1.2 mg/dL 0.4  0.4  0.3   Alkaline Phos 38 - 126  U/L 107  97  98   AST 15 - 41 U/L 20  25  23    ALT 0 - 44 U/L 28  33  32       RADIOGRAPHIC STUDIES: I have personally reviewed the radiological  images as listed and agreed with the findings in the report. No results found.    Orders Placed This Encounter  Procedures   CBC with Differential ([name] Only)    Standing Status:   Future    Expected Date:   03/02/2024    Expiration Date:   03/02/2025   CMP ([name] only)    Standing Status:   Future    Expected Date:   03/02/2024    Expiration Date:   03/02/2025   T4    Standing Status:   Future    Expected Date:   03/02/2024    Expiration Date:   03/02/2025   TSH    Standing Status:   Future    Expected Date:   03/02/2024    Expiration Date:   03/02/2025   All questions were answered. The patient knows to call the clinic with any problems, questions or concerns. No barriers to learning was detected. The total time spent in the appointment was 40 minutes, including review of chart and various tests results, discussions about plan of care and coordination of care plan     Onita Mattock, MD 01/14/2024

## 2024-01-15 ENCOUNTER — Ambulatory Visit (HOSPITAL_COMMUNITY)

## 2024-01-21 ENCOUNTER — Inpatient Hospital Stay

## 2024-01-21 ENCOUNTER — Other Ambulatory Visit: Payer: Self-pay | Admitting: Physician Assistant

## 2024-01-21 DIAGNOSIS — Z5112 Encounter for antineoplastic immunotherapy: Secondary | ICD-10-CM | POA: Diagnosis not present

## 2024-01-21 DIAGNOSIS — Z171 Estrogen receptor negative status [ER-]: Secondary | ICD-10-CM

## 2024-01-21 LAB — CBC WITH DIFFERENTIAL (CANCER CENTER ONLY)
Abs Immature Granulocytes: 0 K/uL (ref 0.00–0.07)
Basophils Absolute: 0 K/uL (ref 0.0–0.1)
Basophils Relative: 2 %
Eosinophils Absolute: 0 K/uL (ref 0.0–0.5)
Eosinophils Relative: 1 %
HCT: 26.8 % — ABNORMAL LOW (ref 36.0–46.0)
Hemoglobin: 9.2 g/dL — ABNORMAL LOW (ref 12.0–15.0)
Immature Granulocytes: 0 %
Lymphocytes Relative: 51 %
Lymphs Abs: 0.7 K/uL (ref 0.7–4.0)
MCH: 33.6 pg (ref 26.0–34.0)
MCHC: 34.3 g/dL (ref 30.0–36.0)
MCV: 97.8 fL (ref 80.0–100.0)
Monocytes Absolute: 0.1 K/uL (ref 0.1–1.0)
Monocytes Relative: 7 %
Neutro Abs: 0.5 K/uL — ABNORMAL LOW (ref 1.7–7.7)
Neutrophils Relative %: 39 %
Platelet Count: 63 K/uL — ABNORMAL LOW (ref 150–400)
RBC: 2.74 MIL/uL — ABNORMAL LOW (ref 3.87–5.11)
RDW: 17.7 % — ABNORMAL HIGH (ref 11.5–15.5)
WBC Count: 1.3 K/uL — ABNORMAL LOW (ref 4.0–10.5)
nRBC: 0 % (ref 0.0–0.2)

## 2024-01-21 LAB — CMP (CANCER CENTER ONLY)
ALT: 37 U/L (ref 0–44)
AST: 27 U/L (ref 15–41)
Albumin: 4.6 g/dL (ref 3.5–5.0)
Alkaline Phosphatase: 101 U/L (ref 38–126)
Anion gap: 4 — ABNORMAL LOW (ref 5–15)
BUN: 13 mg/dL (ref 8–23)
CO2: 30 mmol/L (ref 22–32)
Calcium: 9.7 mg/dL (ref 8.9–10.3)
Chloride: 105 mmol/L (ref 98–111)
Creatinine: 0.46 mg/dL (ref 0.44–1.00)
GFR, Estimated: >60 mL/min (ref >60)
Glucose, Bld: 127 mg/dL — ABNORMAL HIGH (ref 70–99)
Potassium: 3.9 mmol/L (ref 3.5–5.1)
Sodium: 139 mmol/L (ref 135–145)
Total Bilirubin: 0.4 mg/dL (ref 0.0–1.2)
Total Protein: 7.4 g/dL (ref 6.5–8.1)

## 2024-01-21 MED ORDER — FLUCONAZOLE 100 MG PO TABS: ORAL_TABLET | ORAL | 0 refills | Status: AC

## 2024-01-21 NOTE — Progress Notes (Signed)
 Per Dr. Lanny, no treatment today due to ANC 0.5, Plt 63.

## 2024-01-21 NOTE — Patient Instructions (Signed)
 Neutropenia Neutropenia is a condition that occurs when you have low levels of neutrophils. Neutrophils are a type of white blood cells. They are made in the spongy center of bones (bone marrow). They fight infections. Neutrophils are your body's main defense against infections. The fewer neutrophils you have and the longer your body remains without them, the greater your risk of getting a severe infection. What are the causes? This condition can occur if your body uses up or destroys neutrophils faster than your bone marrow can make them. Neutropenia may be caused by: A bacterial or fungal infection. Allergic disorders. Reactions to some medicines. An autoimmune disease. An enlarged spleen. This condition can also occur if your bone marrow does not produce enough neutrophils. This problem may be caused by: Cancer. Cancer treatments, such as radiation or chemotherapy. Viral infections. Medicines, such as phenytoin. Vitamin B12 deficiency. Diseases of the bone marrow. Environmental toxins, such as insecticides. What are the signs or symptoms? This condition does not usually cause symptoms. If symptoms are present, they are usually caused by an underlying infection. Symptoms of an infection may include: Fever. Chills. Swollen glands. Mouth ulcers. Cough. Rash or skin infection. Skin may be red, swollen, or painful. Abdominal or rectal pain. Frequent urination or pain or burning with urination. Because neutropenia weakens the immune system, symptoms of infection may be reduced. It is important to be aware of any changes in your body and talk to your health care provider. How is this diagnosed? This condition is diagnosed based on your medical history and a physical exam. Tests will also be done, such as: A complete blood count (CBC). Bone marrow biopsy. This is collecting a sample of bone marrow for testing. A chest X-ray. A urine culture. A blood culture. How is this  treated? Treatment depends on the underlying cause and severity of your condition. Mild neutropenia may not require treatment. Treatment may include medicines, such as: Antibiotic medicine given through an IV. Antiviral medicines. Antifungal medicines. A medicine to increase production of neutrophils (colony-stimulating factor). You may get this medicine through an IV or by injection. Steroids given through an IV. If an underlying condition is causing neutropenia, you may need treatment for that condition. If medicines or cancer treatments are causing neutropenia, your health care provider may have you stop the medicines or treatment. Follow these instructions at home: Medicines  Take over-the-counter and prescription medicines only as told by your health care provider. Get an annual flu shot. Ask your health care provider whether you or anyone you live with needs any other vaccines. Eating and drinking Do not share food utensils. Do not eat unpasteurized foods. Do not eat raw or undercooked meat, eggs, or seafood. Do not eat unwashed, raw fruits or vegetables. Lifestyle Avoid exposure to groups of people or children. Avoid being around people who are sick. Avoid being around live plants or fresh flowers. Avoid being around dirt or dust, such as in construction areas or gardens. Wear gloves if you are going to do yard work or gardening. Do not provide direct care for pets. Avoid animal droppings. Do not clean litter boxes and bird cages. Do not have sex unless your health care provider has approved. Hygiene  Bathe daily. Clean the area between the genitals and the anus (perineal area) after you urinate or have a bowel movement. If you are female, wipe from front to back. Get regular dental care and brush your teeth with a soft toothbrush before and after meals. Do not use  a regular razor. Use an electric razor to remove hair. Wash your hands often with soap and water for at least 20  seconds. Make sure others who come in contact with you also wash their hands. If soap and water are not available, use hand sanitizer. General instructions Take steps to reduce your risk of injury or infection. Follow any precautions as told by your health care provider. Take actions to avoid cuts and burns. For example: Be cautious when you use knives. Always cut away from yourself. Keep knives in protective sheaths or guards when not in use. Use oven mitts when you cook with a hot stove, oven, or grill. Stand a safe distance away from open fires. Do not use tampons, enemas, or rectal suppositories unless your health care provider has approved. Keep all follow-up visits. This is important. Contact a health care provider if: You have a cough. You have a sore throat. You develop sores in your mouth or anus. You have a warm, red, or tender area on your skin. You have red streaks on the skin. You develop a rash. You have swollen lymph nodes. You have frequent or painful urination. You have vaginal discharge or itching. Get help right away if: You have a fever. You have chills or shaking. You have nausea or vomiting. You have a lot of fatigue. You have shortness of breath. Summary Neutropenia is a condition that occurs when you have a lower-than-normal level of a type of white blood cell (neutrophils) in your body. This condition can occur if your body uses up or destroys neutrophils faster than your bone marrow can make them. Treatment depends on the underlying cause and severity of your condition. Mild neutropenia may not require treatment. Follow any precautions as told by your health care provider to reduce your risk for injury or infection. This information is not intended to replace advice given to you by your health care provider. Make sure you discuss any questions you have with your health care provider.  Thrombocytopenia Thrombocytopenia means that you have a low number of  platelets in your blood. Platelets are tiny cells in the blood. When you bleed, they clump together at the cut or injury to stop the bleeding. This is called blood clotting. If you do not have enough platelets, your blood may have trouble clotting. This may cause you to bleed and bruise very easily. What are the causes? This condition is caused by a low number of platelets in your blood. There are three main reasons for this: Your body not making enough platelets. This may be caused by: Bone marrow diseases. Disorders that are passed from parent to child (inherited). Certain cancer medicines or treatments. Infection from germs (bacteria or viruses). Alcoholism. Platelets not being released in the blood. This can be caused by: Having a spleen that is larger than normal. A condition called Gaucher disease. Your body destroying platelets too quickly. This may be caused by: Certain autoimmune diseases. Some medicines that thin your blood. Certain blood clotting disorders. Certain bleeding disorders. Exposure to harmful (toxic) chemicals. Pregnancy. What are the signs or symptoms? Bruising easily. Bleeding from the nose or mouth. Heavy menstrual periods. Blood in the pee (urine), poop (stool), or vomit. A purple-red color to the skin (purpura). A rash that looks like pinpoint, purple-red spots (petechiae) on the lower legs. How is this treated? Treatment depends on the cause. Treatment may include: Treatment of another condition that is causing the low platelet count. Medicines to help protect your  platelets from being destroyed. A replacement (transfusion) of platelets to stop or prevent bleeding. Surgery to take out the spleen. Follow these instructions at home: Medicines Take over-the-counter and prescription medicines only as told by your doctor. Do not take any medicines that have aspirin  or NSAIDs, such as ibuprofen . Activity Avoid doing things that could hurt or bruise you.  Take action to prevent falls. Do not play contact sports. Ask your doctor what activities are safe for you. Take care not to burn yourself: When you use an iron. When you cook. Take care not to cut yourself: When you shave. When you use scissors, needles, knives, or other tools. General instructions  Check your skin and the inside of your mouth for bruises or blood as told by your doctor. Wear a medical alert bracelet that says that you have a bleeding disorder. Check to see if there is blood in your pee and poop. Do this as told by your doctor. Do not drink alcohol. If you do drink, limit the amount that you drink. Stay away from harmful (toxic) chemicals. Tell all of your doctors that you have this condition. Be sure to tell your dentist and eye doctor. Tell your dentist about your condition before you have your teeth cleaned. Keep all follow-up visits. Contact a doctor if: You have bruises and you do not know why. You have new symptoms. You have symptoms that get worse. You have a fever. Get help right away if: You have very bad bleeding anywhere on your body. You have blood in your vomit, pee, or poop. You have an injury to your head. You have a sudden, very bad headache. Summary Thrombocytopenia means that you have a low number of platelets in your blood. Platelets stick together to form a clot. Symptoms of this condition include getting bruises easily, bleeding from the mouth and nose, a purple-red color to the skin, and a rash. Take care not to cut or burn yourself. This information is not intended to replace advice given to you by your health care provider. Make sure you discuss any questions you have with your health care provider. Document Revised: 09/13/2020 Document Reviewed: 09/13/2020 [employer] Patient Education  2024 [employer] Document Revised: 09/19/2020 Document Reviewed: 09/26/2020 [employer] Patient Education  2025 [employer].

## 2024-01-21 NOTE — Progress Notes (Signed)
 Patient informed to hold Lipitor while taking Fluconazole . Fluconazole  sent in for thrush after discussion with oncologist.

## 2024-01-22 ENCOUNTER — Encounter: Payer: Self-pay | Admitting: Nurse Practitioner

## 2024-01-25 ENCOUNTER — Other Ambulatory Visit: Payer: Self-pay

## 2024-01-26 ENCOUNTER — Encounter: Payer: Self-pay | Admitting: *Deleted

## 2024-01-26 ENCOUNTER — Telehealth: Payer: Self-pay | Admitting: Hematology

## 2024-01-26 NOTE — Assessment & Plan Note (Signed)
 IDC and DCIS, Stage IA, pT1b, cN0, triple negative, Grade 3, chest wall recurrence in 10/2023  -found on screening mammogram. S/p lumpectomy on 01/08/22 with Dr. Aron, path showed: 8 mm invasive and in situ ductal carcinoma with negative margins. Repeat prognostic panel confirmed triple negative disease.  -lymph node biopsies 01/28/22 showed negative nodes (0/2). Port placed at time of procedure. Postoperative course complicated by pneumothorax, which has resolved. -she began adjuvant TC on 02/20/22, and completed planned 4 cycles on 04/24/2022. -she completed adjuvant RT on 07/09/2022  -biopsy confirmed chest lymph nodes (axilla and chest wall including posteriad pectoralis muscle)  recurrence in 10/2023 - Case reviewed in breast tumor board, plan to start neoadjuvant chemotherapy with carboplatin  and paclitaxel  every 3 weeks for 4 cycles, followed by AC every 3 weeks for 4 cycles, along with immunotherapy Keytruda  for 1 year.  If she has good response to treatment, Dr. Aron will consider surgery although she is not able to remove the aubpectoral lymph node.  She may need adjuvant radiation after surgery.

## 2024-01-26 NOTE — Telephone Encounter (Signed)
 I contacted Samantha [name] and informed her of her 10/29 appts. She asked to see the doctor that day and I informed her that I will give her a call tomorrow morning after I ask the nurse for an add on.

## 2024-01-27 ENCOUNTER — Telehealth: Payer: Self-pay

## 2024-01-27 ENCOUNTER — Encounter: Payer: Self-pay | Admitting: Hematology

## 2024-01-27 ENCOUNTER — Inpatient Hospital Stay

## 2024-01-27 ENCOUNTER — Telehealth: Payer: Self-pay | Admitting: Hematology

## 2024-01-27 ENCOUNTER — Inpatient Hospital Stay (HOSPITAL_BASED_OUTPATIENT_CLINIC_OR_DEPARTMENT_OTHER): Admitting: Hematology

## 2024-01-27 VITALS — BP 110/62 | HR 109 | Temp 98.0°F | Resp 18 | Ht 67.0 in | Wt 154.1 lb

## 2024-01-27 VITALS — HR 94

## 2024-01-27 DIAGNOSIS — C50411 Malignant neoplasm of upper-outer quadrant of right female breast: Secondary | ICD-10-CM | POA: Diagnosis not present

## 2024-01-27 DIAGNOSIS — Z5112 Encounter for antineoplastic immunotherapy: Secondary | ICD-10-CM | POA: Diagnosis not present

## 2024-01-27 DIAGNOSIS — Z171 Estrogen receptor negative status [ER-]: Secondary | ICD-10-CM | POA: Diagnosis not present

## 2024-01-27 LAB — CBC WITH DIFFERENTIAL (CANCER CENTER ONLY)
Abs Immature Granulocytes: 0 K/uL (ref 0.00–0.07)
Basophils Absolute: 0 K/uL (ref 0.0–0.1)
Basophils Relative: 1 %
Eosinophils Absolute: 0 K/uL (ref 0.0–0.5)
Eosinophils Relative: 1 %
HCT: 28.1 % — ABNORMAL LOW (ref 36.0–46.0)
Hemoglobin: 9.6 g/dL — ABNORMAL LOW (ref 12.0–15.0)
Immature Granulocytes: 0 %
Lymphocytes Relative: 45 %
Lymphs Abs: 0.6 K/uL — ABNORMAL LOW (ref 0.7–4.0)
MCH: 35.3 pg — ABNORMAL HIGH (ref 26.0–34.0)
MCHC: 34.2 g/dL (ref 30.0–36.0)
MCV: 103.3 fL — ABNORMAL HIGH (ref 80.0–100.0)
Monocytes Absolute: 0.3 K/uL (ref 0.1–1.0)
Monocytes Relative: 23 %
Neutro Abs: 0.4 K/uL — CL (ref 1.7–7.7)
Neutrophils Relative %: 30 %
Platelet Count: 138 K/uL — ABNORMAL LOW (ref 150–400)
RBC: 2.72 MIL/uL — ABNORMAL LOW (ref 3.87–5.11)
RDW: 20.7 % — ABNORMAL HIGH (ref 11.5–15.5)
WBC Count: 1.2 K/uL — ABNORMAL LOW (ref 4.0–10.5)
nRBC: 0 % (ref 0.0–0.2)

## 2024-01-27 LAB — CMP (CANCER CENTER ONLY)
ALT: 63 U/L — ABNORMAL HIGH (ref 0–44)
AST: 43 U/L — ABNORMAL HIGH (ref 15–41)
Albumin: 4.5 g/dL (ref 3.5–5.0)
Alkaline Phosphatase: 116 U/L (ref 38–126)
Anion gap: 5 (ref 5–15)
BUN: 13 mg/dL (ref 8–23)
CO2: 29 mmol/L (ref 22–32)
Calcium: 9.8 mg/dL (ref 8.9–10.3)
Chloride: 106 mmol/L (ref 98–111)
Creatinine: 0.51 mg/dL (ref 0.44–1.00)
GFR, Estimated: >60 mL/min (ref >60)
Glucose, Bld: 142 mg/dL — ABNORMAL HIGH (ref 70–99)
Potassium: 3.9 mmol/L (ref 3.5–5.1)
Sodium: 140 mmol/L (ref 135–145)
Total Bilirubin: 0.4 mg/dL (ref 0.0–1.2)
Total Protein: 7.2 g/dL (ref 6.5–8.1)

## 2024-01-27 MED ORDER — FILGRASTIM-SNDZ 480 MCG/0.8ML IJ SOSY
480.0000 ug | PREFILLED_SYRINGE | Freq: Once | INTRAMUSCULAR | Status: AC
  Administered 2024-01-27: 480 ug via SUBCUTANEOUS
  Filled 2024-01-27: qty 0.8

## 2024-01-27 NOTE — Addendum Note (Signed)
 Addended by: [name] on: 01/27/2024 02:18 PM   Modules accepted: Orders

## 2024-01-27 NOTE — Progress Notes (Addendum)
 [name]   [phone] [phone]   Clinic Follow up Note   Patient Care Team: [name], MD as PCP - General (Internal Medicine) [name], RN as Oncology Nurse Navigator [name], MD as Consulting Physician (General Surgery) [name], MD as Consulting Physician (Hematology) [name], MD as Consulting Physician (Radiation Oncology) [name], MD as Consulting Physician (Endocrinology) [name], MD as Consulting Physician (Sports Medicine) [name], MD as Consulting Physician (Obstetrics and Gynecology) [name], [name], NP as Nurse Practitioner (Nurse Practitioner) Imaging, [name] as Radiologist (Diagnostic Radiology)  Date of Service:  01/27/2024  CHIEF COMPLAINT: f/u of triple negative breast cancer  CURRENT THERAPY:  Chemotherapy carboplatin , Abraxane  and Keytruda   Oncology History   Malignant neoplasm of upper-outer quadrant of right breast in female, estrogen receptor negative (HCC) IDC and DCIS, Stage IA, pT1b, cN0, triple negative, Grade 3, chest wall recurrence in 10/2023  -found on screening mammogram. S/p lumpectomy on 01/08/22 with Dr. Aron, path showed: 8 mm invasive and in situ ductal carcinoma with negative margins. Repeat prognostic panel confirmed triple negative disease.  -lymph node biopsies 01/28/22 showed negative nodes (0/2). Port placed at time of procedure. Postoperative course complicated by pneumothorax, which has resolved. -she began adjuvant TC on 02/20/22, and completed planned 4 cycles on 04/24/2022. -she completed adjuvant RT on 07/09/2022  -biopsy confirmed chest lymph nodes (axilla and chest wall including posteriad pectoralis muscle)  recurrence in 10/2023 - Case reviewed in breast tumor board, plan to start neoadjuvant chemotherapy with carboplatin  and paclitaxel  every 3 weeks for 4 cycles, followed by AC every 3 weeks for 4 cycles, along with  immunotherapy Keytruda  for 1 year.  If she has good response to treatment, Dr. Aron will consider surgery although she is not able to remove the aubpectoral lymph node.  She may need adjuvant radiation after surgery.  Assessment & Plan Right upper-outer quadrant breast cancer Undergoing treatment with Carbo, Keytruda , and Abraxane , transitioning to Adriamycin and Cytoxan  (AC) with continued Keytruda  in 2 weeks. The regimen is aggressive due to her good health and willingness to proceed. The goal is complete response with no visible uptake on PET scans and normalization of lymph node size. Monitoring for side effects, particularly cardiac effects from Adriamycin, and adjusting doses based on blood count recovery. Informed about potential long-term side effects, including heart failure and rare blood disorders. PET scan planned before year-end to assess treatment response and consider surgery and/or radiation based on results. - Continue current chemotherapy regimen with Carbo, Keytruda , and Abraxane . - Transition to Adriamycin and Cytoxan  (AC) regimen with continued Keytruda . - Monitor blood counts before each treatment and adjust doses if necessary. - Schedule PET scan before the end of the year to assess treatment response. - Consider surgery and/or radiation based on PET scan results. - Monitor for side effects, particularly cardiac effects, and perform echocardiograms as needed.  Chemotherapy-induced neutropenia Experiencing chemotherapy-induced neutropenia with low total white blood count and neutrophil count. Plan includes administering growth factor injections to support neutrophil recovery. Scheduled for three injections due to low white blood count. - Administer three growth factor injections to support neutrophil recovery. - Monitor blood counts regularly to assess recovery and adjust treatment as needed.  Oral candidiasis (thrush) Oral candidiasis presenting as a white, fuzzy coating on  the tongue and a red throat. On fluconazole  for seven days and using Magic Mouthwash. Causes a fuzzy feeling and affects taste but does not  impact chewing or swallowing. - Complete current course of fluconazole . - Continue using Magic Mouthwash for symptom relief.  Plan - Lab reviewed, ANC still pending, if more than 0.5, proceed last dose of Abraxane  today, with G-CSF daily for 3 days later this week - She will start [name] and at continue Keytruda  in 2 weeks, f/u in 2 weeks  - Will review her CT scan in our tumor board in 2 weeks   Addendum ANC 0.4 today, will cancel last dose of Abraxane  today.  Proceed with G-CSF injection this week, and start first cycle of AC in 2 weeks.   SUMMARY OF ONCOLOGIC HISTORY: Oncology History Overview Note   Cancer Staging  Malignant neoplasm of upper-outer quadrant of right breast in female, estrogen receptor negative Staging form: Breast, AJCC 8th Edition - Clinical stage from 12/23/2021: cT1b, cM0, G3, ER-, PR-, HER2- - Unsigned Stage prefix: Initial diagnosis Histologic grading system: 3 grade system - Pathologic stage from 01/08/2022: Stage IB (pT1b, pN0, cM0, G3, ER-, PR-, HER2-) - Signed by [name], MD on 01/24/2022 Stage prefix: Initial diagnosis Histologic grading system: 3 grade system Residual tumor (R): R0 - None     Malignant neoplasm of upper-outer quadrant of right breast in female, estrogen receptor negative (HCC)  12/06/2021 Mammogram   CLINICAL DATA:  Patient returns today to evaluate RIGHT breast calcifications identified on a recent screening mammogram.   EXAM: DIGITAL DIAGNOSTIC UNILATERAL RIGHT MAMMOGRAM  IMPRESSION: Grouped coarse heterogeneous calcifications within the upper-outer quadrant of the RIGHT breast, measuring 6 mm extent. These may be fibroadenomatous calcifications. Stereotactic biopsy is recommended to exclude malignancy.   12/18/2021 Initial Biopsy   Diagnosis Breast, right, needle core biopsy, upper outer  quadrant, x clip - DUCTAL CARCINOMA IN SITU, SOLID AND CRIBRIFORM TYPE WITH COMEDONECROSIS AND ASSOCIATED CALCIFICATIONS, NUCLEAR GRADE 3 OF 3 - FOCAL MICROINVASION IS PRESENT (LESS THAN 1 MM) WITH EVIDENCE OF LYMPHOVASCULAR INVASION - NECROSIS: PRESENT - CALCIFICATIONS: PRESENT - DCIS LENGTH: 7 MM IN GREATEST LINEAR DIMENSION ON FRAGMENTED CORES  PROGNOSTIC INDICATORS Results: IMMUNOHISTOCHEMICAL AND MORPHOMETRIC ANALYSIS PERFORMED MANUALLY The tumor cells are NEGATIVE for Her2 (1+). Estrogen Receptor: 0%, NEGATIVE Progesterone Receptor: 0%, NEGATIVE Proliferation Marker Ki67: 60%   12/23/2021 Initial Diagnosis   Malignant neoplasm of upper-outer quadrant of right breast in female, estrogen receptor negative (HCC)   12/23/2021 Imaging   EXAM: ULTRASOUND OF THE RIGHT AXILLA  IMPRESSION: No abnormal appearing RIGHT axillary lymph nodes.   01/08/2022 Cancer Staging   Staging form: Breast, AJCC 8th Edition - Pathologic stage from 01/08/2022: Stage IB (pT1b, pN0, cM0, G3, ER-, PR-, HER2-) - Signed by [name], MD on 01/24/2022 Stage prefix: Initial diagnosis Histologic grading system: 3 grade system Residual tumor (R): R0 - None   02/20/2022 - 04/24/2022 Chemotherapy   Patient is on Treatment Plan : BREAST TC q21d     10/06/2022 Survivorship   SCP delivered by Lacie [name], NP   11/13/2023 -  Chemotherapy   Patient is on Treatment Plan : BREAST Pembrolizumab  (200) D1 + Carboplatin  (5) D1 + Paclitaxel  (80) D1,8,15 q21d X 4 cycles / Pembrolizumab  (200) D1 + AC D1 q21d x 4 cycles     Ductal carcinoma in situ (DCIS) of left breast  12/09/2022 Initial Diagnosis   Ductal carcinoma in situ (DCIS) of left breast   01/01/2023 Surgery   Let breast seed localized lumpectomy.with Dr. Aron  2.5 cm DCIS with negative margins.     02/11/2023 - 03/10/2023 Radiation Therapy   Dose  per fraction - 2.66 Gy Prescribed dose (delivered/prescribed)  42.56/42.56 Prescribed Fxs (delivered/prescribed)   16/16  Boost  Dose per fraction -  2Gy Prescribed dose (delivered/prescribed) 8Gy/8Gy) Prescribed Fxs (delivered/prescribed) (4Gy/4Gy)        Discussed the use of AI scribe software for clinical note transcription with the patient, who gave verbal consent to proceed.  History of Present Illness [name] is a 63 year old female with breast cancer who presents for follow-up.  She is undergoing chemotherapy with Carbo, Keytruda , and Abraxane , with a reduced dosing due to rapid drug metabolism. Her treatment schedule includes all three drugs in the first week, followed by single drug treatments in the second and third weeks, with a week off before starting a new cycle. She is scheduled to switch to Adriamycin and Cytoxan  [facility]) with Keytruda , with infusions every three weeks.  She receives growth factor injections due to low white blood cell counts, specifically neutrophils. Her hemoglobin is 9.8, and her platelet count is 138. She has had multiple scans, including a PET scan in July and a CT scan on September 29th, with plans for another PET scan before the end of the year.  She is taking fluconazole  tablets for oral thrush, which causes a 'fuzzy feeling' in her mouth and affects her taste. She has been on fluconazole  for seven days with four pills remaining and uses Magic Mouthwash for symptom relief. There is no difficulty chewing or swallowing food, and no pain when swallowing.     All other systems were reviewed with the patient and are negative.  MEDICAL HISTORY:  Past Medical History:  Diagnosis Date   Allergy    Anxiety 2015   Result of culmination of super stressful caregiving and deaths of 2 immediate family members.  Overall do pretty well.   Cancer [name]) 2023   right breast   Coronary artery calcification    Depression    denies   GERD (gastroesophageal reflux disease)    Hyperlipidemia    Hypothyroidism    Right carpal tunnel syndrome 09/19/2019   Thyroid  disease      SURGICAL HISTORY: Past Surgical History:  Procedure Laterality Date   BREAST BIOPSY Left 12/04/2022   MM LT BREAST BX W LOC DEV 1ST LESION IMAGE BX SPEC STEREO GUIDE 12/04/2022 [facility]   BREAST BIOPSY  12/30/2022   MM LT RADIOACTIVE SEED LOC MAMMO GUIDE 12/30/2022 [facility]   BREAST LUMPECTOMY WITH RADIOACTIVE SEED LOCALIZATION Right 01/08/2022   Procedure: RIGHT BREAST LUMPECTOMY WITH RADIOACTIVE SEED LOCALIZATION;  Surgeon: [name], MD;  Location: [facility] OR;  Service: General;  Laterality: Right;   BREAST LUMPECTOMY WITH RADIOACTIVE SEED LOCALIZATION Left 01/01/2023   Procedure: LEFT BREAST SEED LOCALIZED LUMPECTOMY;  Surgeon: [name], MD;  Location: [facility] OR;  Service: General;  Laterality: Left;   PORT-A-CATH REMOVAL Left 05/20/2022   Procedure: REMOVAL PORT-A-CATH;  Surgeon: [name], MD;  Location: [facility];  Service: General;  Laterality: Left;   PORTACATH PLACEMENT Left 01/28/2022   Procedure: INSERTION PORT-A-CATH;  Surgeon: [name], MD;  Location: [facility];  Service: General;  Laterality: Left;   PORTACATH PLACEMENT Left 11/04/2023   Procedure: INSERTION, TUNNELED CENTRAL VENOUS DEVICE, WITH PORT;  Surgeon: [name], MD;  Location: [facility];  Service: General;  Laterality: Left;  PORT PLACEMENT WITH ULTRASOUND GUIDANCE   SENTINEL NODE BIOPSY Right 01/28/2022   Procedure: RIGHT SENTINEL LYMPH NODE BIOPSY;  Surgeon: [name], MD;  Location:  [facility];  Service: General;  Laterality: Right;   TONSILLECTOMY      I have reviewed the social history and family history with the patient and they are unchanged from previous note.  ALLERGIES:  is allergic to nortriptyline , singulair  [montelukast  sodium], sulfonamide derivatives, amitriptyline  hcl, esomeprazole, fluticasone , gabapentin , lexapro [escitalopram], neomycin, paclitaxel , pseudoephedrine, and neosporin [bacitracin-polymyxin  b].  MEDICATIONS:  Current Outpatient Medications  Medication Sig Dispense Refill   acetaminophen  (TYLENOL ) 325 MG tablet Take 650 mg by mouth every 6 (six) hours as needed.     ALPRAZolam  (XANAX ) 0.5 MG tablet Take 1 tablet (0.5 mg total) by mouth daily as needed for anxiety. 30 tablet 1   atorvastatin (LIPITOR) 20 MG tablet Take 20 mg by mouth every evening.     betamethasone dipropionate 0.05 % cream Apply 1 Application topically 2 (two) times daily as needed (skin irritation.).     Calcium  Carb-Cholecalciferol (CALCIUM -VITAMIN D3) 250-125 MG-UNIT TABS Take 1 tablet by mouth daily with lunch.     Cholecalciferol (VITAMIN D3) 50 MCG (2000 UT) TABS Take 2,000 Units by mouth daily with lunch.     Clobetasol  Prop Emollient Base 0.05 % emollient cream Apply 1 Application topically 2 (two) times daily. 60 g 1   ezetimibe (ZETIA) 10 MG tablet Take 10 mg by mouth every evening.     famotidine  (PEPCID ) 20 MG tablet Take 1 tablet (20 mg total) by mouth daily. (Patient taking differently: Take 20 mg by mouth daily as needed for heartburn or indigestion.)     Fezolinetant (VEOZAH) 45 MG TABS Take 45 mg by mouth in the morning. (Patient not taking: Reported on 11/26/2023)     fluconazole  (DIFLUCAN ) 100 MG tablet Take 2 tablets (200 mg total) by mouth daily for 1 day, THEN 1 tablet (100 mg total) daily for 9 days. 11 tablet 0   HYDROcodone -acetaminophen  (NORCO/VICODIN) 5-325 MG tablet Take 1 tablet by mouth every 6 (six) hours as needed for moderate pain (pain score 4-6). 15 tablet 0   lidocaine -prilocaine  (EMLA ) cream Apply to affected area once 30 g 3   loperamide (IMODIUM) 2 MG capsule Take by mouth as needed for diarrhea or loose stools.     magic mouthwash (nystatin , diphenhydrAMINE , alum & mag hydroxide) suspension mixture Swish and spit 5 mLs 4 (four) times daily as needed for mouth pain. 140 mL 1   nystatin  (MYCOSTATIN ) 100000 UNIT/ML suspension Take 5 mLs (500,000 Units total) by mouth 4 (four)  times daily. 473 mL 0   nystatin -triamcinolone (MYCOLOG II) cream Apply 1 Application topically 2 (two) times daily as needed (irritation).     ondansetron  (ZOFRAN ) 8 MG tablet Take 1 tablet (8 mg total) by mouth every 8 (eight) hours as needed for nausea or vomiting. Start on the third day after chemotherapy. 30 tablet 1   pantoprazole  (PROTONIX ) 40 MG tablet Take 1 tablet (40 mg total) by mouth 2 (two) times daily. 60 tablet 2   prochlorperazine  (COMPAZINE ) 10 MG tablet Take 1 tablet (10 mg total) by mouth every 6 (six) hours as needed for nausea or vomiting. 30 tablet 1   sertraline  (ZOLOFT ) 50 MG tablet Take 50 mg by mouth in the morning.     tacrolimus (PROTOPIC) 0.03 % ointment Apply 1 Application topically 2 (two) times daily as needed (skin irritation.).     TIROSINT  100 MCG CAPS Take 100 mcg by mouth See admin instructions. Take 1 capsule (100 mcg) by mouth every 3 days, alternating with (Tirosint  88  mcg)--take on an empty stomach.     TIROSINT  88 MCG CAPS Take 88 mcg by mouth See admin instructions. Take 1 capsule (88 mcg) by mouth 2 days in a row, alternating with Tirosint  (100 mcg) on the 3 rd day.     triamcinolone ointment (KENALOG) 0.1 % Apply 1 Application topically 2 (two) times daily as needed (skin irritation.).     valACYclovir (VALTREX) 500 MG tablet Take 500 mg by mouth 2 (two) times daily as needed (cold sore/fever blisters).     No current facility-administered medications for this visit.    PHYSICAL EXAMINATION: ECOG PERFORMANCE STATUS: 2 - Symptomatic, <50% confined to bed  Vitals:   01/27/24 1200  BP: 110/62  Pulse: (!) 109  Resp: 18  Temp: 98 F (36.7 C)  SpO2: 98%   Wt Readings from Last 3 Encounters:  01/27/24 154 lb 1.6 oz (69.9 kg)  01/21/24 154 lb (69.9 kg)  01/13/24 158 lb (71.7 kg)     GENERAL:alert, no distress and comfortable SKIN: skin color, texture, turgor are normal, no rashes or significant lesions EYES: normal, Conjunctiva are pink and  non-injected, sclera clear NECK: supple, thyroid  normal size, non-tender, without nodularity LYMPH:  no palpable lymphadenopathy in the cervical, axillary  LUNGS: clear to auscultation and percussion with normal breathing effort HEART: regular rate & rhythm and no murmurs and no lower extremity edema ABDOMEN:abdomen soft, non-tender and normal bowel sounds Musculoskeletal:no cyanosis of digits and no clubbing  NEURO: alert & oriented x 3 with fluent speech, no focal motor/sensory deficits  Physical Exam HEENT: Tongue with white coating. Throat slightly red, no white spots.  LABORATORY DATA:  I have reviewed the data as listed    Latest Ref Rng & Units 01/27/2024   12:43 PM 01/21/2024    1:06 PM 01/13/2024   10:40 AM  CBC  WBC 4.0 - 10.5 K/uL 1.2  1.3  5.6   Hemoglobin 12.0 - 15.0 g/dL 9.6  9.2  9.6   Hematocrit 36.0 - 46.0 % 28.1  26.8  28.3   Platelets 150 - 400 K/uL 138  63  90         Latest Ref Rng & Units 01/27/2024   12:43 PM 01/21/2024    1:06 PM 01/13/2024   10:40 AM  CMP  Glucose 70 - 99 mg/dL 857  872  846   BUN 8 - 23 mg/dL 13  13  11    Creatinine 0.44 - 1.00 mg/dL 9.48  9.53  9.46   Sodium 135 - 145 mmol/L 140  139  140   Potassium 3.5 - 5.1 mmol/L 3.9  3.9  3.6   Chloride 98 - 111 mmol/L 106  105  105   CO2 22 - 32 mmol/L 29  30  28    Calcium  8.9 - 10.3 mg/dL 9.8  9.7  9.3   Total Protein 6.5 - 8.1 g/dL 7.2  7.4  6.9   Total Bilirubin 0.0 - 1.2 mg/dL 0.4  0.4  0.4   Alkaline Phos 38 - 126 U/L 116  101  107   AST 15 - 41 U/L 43  27  20   ALT 0 - 44 U/L 63  37  28       RADIOGRAPHIC STUDIES: I have personally reviewed the radiological images as listed and agreed with the findings in the report. No results found.    No orders of the defined types were placed in this encounter.  All questions were answered.  The patient knows to call the clinic with any problems, questions or concerns. No barriers to learning was detected. The total time spent in the  appointment was 30 minutes, including review of chart and various tests results, discussions about plan of care and coordination of care plan     Onita Mattock, MD 01/27/2024

## 2024-01-27 NOTE — Telephone Encounter (Signed)
 CRITICAL VALUE STICKER  CRITICAL VALUE: ANC 0.4  RECEIVER (on-site recipient of call): Norleen Spain CMA  DATE & TIME NOTIFIED: 01/27/2024  MESSENGER (representative from lab): Heather in lab  MD NOTIFIED: Dr. Lanny  TIME OF NOTIFICATION: 1350  RESPONSE:  treatment canceled and patient made aware

## 2024-01-27 NOTE — Telephone Encounter (Signed)
 [name] has been contacted and made aware of her MD visit added for 10/29 per her request to see Dr. Lanny that day.

## 2024-01-27 NOTE — Patient Instructions (Signed)
 Filgrastim Injection What is this medication? FILGRASTIM (fil GRA stim) lowers the risk of infection in people who are receiving chemotherapy. It works by Systems analyst make more white blood cells, which protects your body from infection. It may also be used to help people who have been exposed to high doses of radiation. It can be used to help prepare your body before a stem cell transplant. It works by helping your bone marrow make and release stem cells into the blood. This medicine may be used for other purposes; ask your health care provider or pharmacist if you have questions. COMMON BRAND NAME(S): Neupogen, Nivestym, Nypozi, Releuko, Zarxio What should I tell my care team before I take this medication? They need to know if you have any of these conditions: History of blood diseases, such as sickle cell anemia Kidney disease Recent or ongoing radiation An unusual or allergic reaction to filgrastim, pegfilgrastim, latex, rubber, other medications, foods, dyes, or preservatives Pregnant or trying to get pregnant Breast-feeding How should I use this medication? This medication is injected under the skin or into a vein. It is usually given by your care team in a hospital or clinic setting. It may be given at home. If you get this medication at home, you will be taught how to prepare and give it. Use exactly as directed. Take it as directed on the prescription label at the same time every day. Keep taking it unless your care team tells you to stop. It is important that you put your used needles and syringes in a special sharps container. Do not put them in a trash can. If you do not have a sharps container, call your pharmacist or care team to get one. This medication comes with INSTRUCTIONS FOR USE. Ask your pharmacist for directions on how to use this medication. Read the information carefully. Talk to your pharmacist or care team if you have questions. Talk to your care team about the use of  this medication in children. While it may be prescribed for children for selected conditions, precautions do apply. Overdosage: If you think you have taken too much of this medicine contact a poison control center or emergency room at once. NOTE: This medicine is only for you. Do not share this medicine with others. What if I miss a dose? It is important not to miss any doses. Talk to your care team about what to do if you miss a dose. What may interact with this medication? Medications that may cause a release of neutrophils, such as lithium This list may not describe all possible interactions. Give your health care provider a list of all the medicines, herbs, non-prescription drugs, or dietary supplements you use. Also tell them if you smoke, drink alcohol, or use illegal drugs. Some items may interact with your medicine. What should I watch for while using this medication? Your condition will be monitored carefully while you are receiving this medication. You may need bloodwork while taking this medication. Talk to your care team about your risk of cancer. You may be more at risk for certain types of cancer if you take this medication. What side effects may I notice from receiving this medication? Side effects that you should report to your care team as soon as possible: Allergic reactions--skin rash, itching, hives, swelling of the face, lips, tongue, or throat Capillary leak syndrome--stomach or muscle pain, unusual weakness or fatigue, feeling faint or lightheaded, decrease in the amount of urine, swelling of the ankles, hands,  or feet, trouble breathing High white blood cell level--fever, fatigue, trouble breathing, night sweats, change in vision, weight loss Inflammation of the aorta--fever, fatigue, back, chest, or stomach pain, severe headache Kidney injury (glomerulonephritis)--decrease in the amount of urine, red or dark brown urine, foamy or bubbly urine, swelling of the ankles, hands,  or feet Shortness of breath or trouble breathing Spleen injury--pain in upper left stomach or shoulder Unusual bruising or bleeding Side effects that usually do not require medical attention (report to your care team if they continue or are bothersome): Back pain Bone pain Fatigue Fever Headache Nausea This list may not describe all possible side effects. Call your doctor for medical advice about side effects. You may report side effects to FDA at 1-800-FDA-1088. Where should I keep my medication? Keep out of the reach of children and pets. Keep this medication in the original packaging until you are ready to take it. Protect from light. See product for storage information. Each product may have different instructions. Get rid of any unused medication after the expiration date. To get rid of medications that are no longer needed or have expired: Take the medication to a medications take-back program. Check with your pharmacy or law enforcement to find a location. If you cannot return the medication, ask your pharmacist or care team how to get rid of this medication safely. NOTE: This sheet is a summary. It may not cover all possible information. If you have questions about this medicine, talk to your doctor, pharmacist, or health care provider.  2024 Elsevier/Gold Standard (2021-08-22 00:00:00)

## 2024-01-28 ENCOUNTER — Ambulatory Visit

## 2024-01-28 ENCOUNTER — Inpatient Hospital Stay

## 2024-01-28 ENCOUNTER — Ambulatory Visit: Admitting: Hematology

## 2024-01-28 ENCOUNTER — Other Ambulatory Visit

## 2024-01-28 VITALS — BP 116/62 | HR 110 | Temp 98.5°F | Resp 17

## 2024-01-28 DIAGNOSIS — Z171 Estrogen receptor negative status [ER-]: Secondary | ICD-10-CM

## 2024-01-28 DIAGNOSIS — Z5112 Encounter for antineoplastic immunotherapy: Secondary | ICD-10-CM | POA: Diagnosis not present

## 2024-01-28 MED ORDER — FILGRASTIM-SNDZ 480 MCG/0.8ML IJ SOSY
480.0000 ug | PREFILLED_SYRINGE | Freq: Once | INTRAMUSCULAR | Status: AC
  Administered 2024-01-28: 480 ug via SUBCUTANEOUS
  Filled 2024-01-28: qty 0.8

## 2024-01-29 ENCOUNTER — Other Ambulatory Visit: Payer: Self-pay | Admitting: Physician Assistant

## 2024-01-29 ENCOUNTER — Inpatient Hospital Stay

## 2024-01-29 VITALS — BP 121/69 | HR 105 | Temp 98.4°F | Resp 15

## 2024-01-29 DIAGNOSIS — C50411 Malignant neoplasm of upper-outer quadrant of right female breast: Secondary | ICD-10-CM

## 2024-01-29 DIAGNOSIS — Z5112 Encounter for antineoplastic immunotherapy: Secondary | ICD-10-CM | POA: Diagnosis not present

## 2024-01-29 MED ORDER — NYSTATIN 100000 UNIT/ML MT SUSP: 5.0000 mL | Freq: Four times a day (QID) | OROMUCOSAL | 0 refills | Status: AC

## 2024-01-29 MED ORDER — FILGRASTIM-SNDZ 480 MCG/0.8ML IJ SOSY
480.0000 ug | PREFILLED_SYRINGE | Freq: Once | INTRAMUSCULAR | Status: AC
  Administered 2024-01-29: 480 ug via SUBCUTANEOUS
  Filled 2024-01-29: qty 0.8

## 2024-01-30 ENCOUNTER — Ambulatory Visit

## 2024-02-01 ENCOUNTER — Inpatient Hospital Stay: Admitting: Physician Assistant

## 2024-02-01 ENCOUNTER — Inpatient Hospital Stay

## 2024-02-01 VITALS — BP 123/63 | HR 79 | Temp 98.9°F | Resp 14 | Wt 152.3 lb

## 2024-02-01 DIAGNOSIS — Z5112 Encounter for antineoplastic immunotherapy: Secondary | ICD-10-CM | POA: Diagnosis not present

## 2024-02-01 DIAGNOSIS — Z171 Estrogen receptor negative status [ER-]: Secondary | ICD-10-CM

## 2024-02-01 MED ORDER — SODIUM CHLORIDE 0.9 % IV SOLN: Freq: Once | INTRAVENOUS | Status: AC

## 2024-02-01 NOTE — Progress Notes (Signed)
 Nutrition  Patient called saying that her infusion got cancelled for 10/22 and wanted to change her nutrition appointment.  Spoke with patient by phone and agreeable to phone visit on 10/22 with RD vs in person visit.   Brennah Quraishi B. Dasie SOLON, CSO, LDN Registered Dietitian [phone]

## 2024-02-01 NOTE — Patient Instructions (Signed)

## 2024-02-03 ENCOUNTER — Encounter: Payer: Self-pay | Admitting: Hematology

## 2024-02-03 ENCOUNTER — Other Ambulatory Visit: Payer: Self-pay

## 2024-02-03 ENCOUNTER — Inpatient Hospital Stay

## 2024-02-03 ENCOUNTER — Inpatient Hospital Stay: Admitting: Dietician

## 2024-02-03 ENCOUNTER — Telehealth: Payer: Self-pay | Admitting: Dietician

## 2024-02-03 NOTE — Telephone Encounter (Signed)
 Nutrition Follow-up:  Pt with recurrence of breast cancer in left chest wall, triple negative. She is currently receiving chemoimmunotherapy with carbo/abraxane  + keytruda  q21d. Patient is under the care of Dr. Lanny.   10/15 final abraxane  cancelled d/t labs. Treatment held this week. Plans to start AC + Keytruda  10/29  Spoke with patient via telephone for nutrition follow-up. Patient reports feeling much better. Patient says she still does not have much of an appetite, but pushing nutrition. Patient tolerating more solids. Patient has been trying to eat 2 ounces of chicken a few times/day. Yesterday she did this x2 and has had 2 oz this morning. Patient purchased whey protein powder. This tasted horrible despite mixing it with milk. Patient asking about protein pills that she found on the internet.    Medications: reviewed   Labs: 10/15 labs reviewed   Anthropometrics: Wt 152 lb 4.8 oz on 10/20 decreased 5.6% in 5 weeks (severe for time frame)  10/15 - 154 lb 1.6 oz 9/25 - 158 lb 11.2 oz 9/11 - 161 lb 14.4 oz   NUTRITION DIAGNOSIS: Unintended wt loss continues    INTERVENTION:  Continue supplementing with CIB with fairlife milk 2x/day as tolerated Reviewed small frequent meals/snacks q3h Pt agreeable to glass of milk at bedtime  RD to review nutrition on protein pills     MONITORING, EVALUATION, GOAL: wt trends, intake   NEXT VISIT: Wednesday October 29 during infusion

## 2024-02-04 ENCOUNTER — Telehealth: Payer: Self-pay

## 2024-02-04 NOTE — Telephone Encounter (Signed)
 As per Powell Lessen NP I faxed the patients last lab results to Endocrinologist Dr. Lear Faster, received confirmation of receipt.     Can you please print out the last TSH, T3, and T4 results and fax to Ms. Bungert's endocrinologist?  Please and thank you.  Powell, NP

## 2024-02-05 ENCOUNTER — Inpatient Hospital Stay

## 2024-02-09 NOTE — Assessment & Plan Note (Signed)
-  diagnosed in 12/05/2022, ER-/PR-, g2 -s/p left lumpectomy by Dr. Donell Beers on January 01, 2023.  Surgical path showed intermediate grade DCIS with necrosis, margins were negative. -She has completed adjuvant radiation -No role for adjuvant antiestrogen therapy -Continue breast cancer surveillance

## 2024-02-09 NOTE — Assessment & Plan Note (Signed)
 IDC and DCIS, Stage IA, pT1b, cN0, triple negative, Grade 3, chest wall recurrence in 10/2023  -found on screening mammogram. S/p lumpectomy on 01/08/22 with Dr. Aron, path showed: 8 mm invasive and in situ ductal carcinoma with negative margins. Repeat prognostic panel confirmed triple negative disease.  -lymph node biopsies 01/28/22 showed negative nodes (0/2). Port placed at time of procedure. Postoperative course complicated by pneumothorax, which has resolved. -she began adjuvant TC on 02/20/22, and completed planned 4 cycles on 04/24/2022. -she completed adjuvant RT on 07/09/2022  -biopsy confirmed chest lymph nodes (axilla and chest wall including posteriad pectoralis muscle)  recurrence in 10/2023 - Case reviewed in breast tumor board, plan to start neoadjuvant chemotherapy with carboplatin  and paclitaxel  every 3 weeks for 4 cycles, followed by AC every 3 weeks for 4 cycles, along with immunotherapy Keytruda  for 1 year.  If she has good response to treatment, Dr. Aron will consider surgery although she is not able to remove the aubpectoral lymph node.  She may need adjuvant radiation after surgery.

## 2024-02-10 ENCOUNTER — Inpatient Hospital Stay (HOSPITAL_BASED_OUTPATIENT_CLINIC_OR_DEPARTMENT_OTHER): Admitting: Hematology

## 2024-02-10 ENCOUNTER — Inpatient Hospital Stay

## 2024-02-10 ENCOUNTER — Encounter: Payer: Self-pay | Admitting: Hematology

## 2024-02-10 ENCOUNTER — Inpatient Hospital Stay: Admitting: Hematology

## 2024-02-10 VITALS — BP 110/60 | HR 81 | Temp 97.6°F | Resp 17 | Ht 67.0 in | Wt 150.5 lb

## 2024-02-10 DIAGNOSIS — C50411 Malignant neoplasm of upper-outer quadrant of right female breast: Secondary | ICD-10-CM | POA: Diagnosis not present

## 2024-02-10 DIAGNOSIS — D0512 Intraductal carcinoma in situ of left breast: Secondary | ICD-10-CM

## 2024-02-10 DIAGNOSIS — Z5112 Encounter for antineoplastic immunotherapy: Secondary | ICD-10-CM | POA: Diagnosis not present

## 2024-02-10 DIAGNOSIS — Z171 Estrogen receptor negative status [ER-]: Secondary | ICD-10-CM

## 2024-02-10 LAB — CBC WITH DIFFERENTIAL (CANCER CENTER ONLY)
Abs Immature Granulocytes: 0 K/uL (ref 0.00–0.07)
Basophils Absolute: 0 K/uL (ref 0.0–0.1)
Basophils Relative: 1 %
Eosinophils Absolute: 0 K/uL (ref 0.0–0.5)
Eosinophils Relative: 1 %
HCT: 30.4 % — ABNORMAL LOW (ref 36.0–46.0)
Hemoglobin: 10.2 g/dL — ABNORMAL LOW (ref 12.0–15.0)
Immature Granulocytes: 0 %
Lymphocytes Relative: 23 %
Lymphs Abs: 0.7 K/uL (ref 0.7–4.0)
MCH: 35.9 pg — ABNORMAL HIGH (ref 26.0–34.0)
MCHC: 33.6 g/dL (ref 30.0–36.0)
MCV: 107 fL — ABNORMAL HIGH (ref 80.0–100.0)
Monocytes Absolute: 0.3 K/uL (ref 0.1–1.0)
Monocytes Relative: 10 %
Neutro Abs: 2.1 K/uL (ref 1.7–7.7)
Neutrophils Relative %: 65 %
Platelet Count: 168 K/uL (ref 150–400)
RBC: 2.84 MIL/uL — ABNORMAL LOW (ref 3.87–5.11)
RDW: 19.9 % — ABNORMAL HIGH (ref 11.5–15.5)
WBC Count: 3.1 K/uL — ABNORMAL LOW (ref 4.0–10.5)
nRBC: 0 % (ref 0.0–0.2)

## 2024-02-10 LAB — CMP (CANCER CENTER ONLY)
ALT: 59 U/L — ABNORMAL HIGH (ref 0–44)
AST: 43 U/L — ABNORMAL HIGH (ref 15–41)
Albumin: 4.3 g/dL (ref 3.5–5.0)
Alkaline Phosphatase: 161 U/L — ABNORMAL HIGH (ref 38–126)
Anion gap: 6 (ref 5–15)
BUN: 12 mg/dL (ref 8–23)
CO2: 30 mmol/L (ref 22–32)
Calcium: 9.3 mg/dL (ref 8.9–10.3)
Chloride: 104 mmol/L (ref 98–111)
Creatinine: 0.49 mg/dL (ref 0.44–1.00)
GFR, Estimated: >60 mL/min (ref >60)
Glucose, Bld: 107 mg/dL — ABNORMAL HIGH (ref 70–99)
Potassium: 3.7 mmol/L (ref 3.5–5.1)
Sodium: 140 mmol/L (ref 135–145)
Total Bilirubin: 0.5 mg/dL (ref 0.0–1.2)
Total Protein: 7.1 g/dL (ref 6.5–8.1)

## 2024-02-10 MED ORDER — PALONOSETRON HCL INJECTION 0.25 MG/5ML
0.2500 mg | Freq: Once | INTRAVENOUS | Status: AC
  Administered 2024-02-10: 0.25 mg via INTRAVENOUS
  Filled 2024-02-10: qty 5

## 2024-02-10 MED ORDER — DOXORUBICIN HCL CHEMO IV INJECTION 2 MG/ML
60.0000 mg/m2 | Freq: Once | INTRAVENOUS | Status: AC
  Administered 2024-02-10: 118 mg via INTRAVENOUS
  Filled 2024-02-10: qty 59

## 2024-02-10 MED ORDER — APREPITANT 130 MG/18ML IV EMUL
130.0000 mg | Freq: Once | INTRAVENOUS | Status: AC
  Administered 2024-02-10: 130 mg via INTRAVENOUS
  Filled 2024-02-10: qty 18

## 2024-02-10 MED ORDER — SODIUM CHLORIDE 0.9 % IV SOLN
600.0000 mg/m2 | Freq: Once | INTRAVENOUS | Status: AC
  Administered 2024-02-10: 1180 mg via INTRAVENOUS
  Filled 2024-02-10: qty 59

## 2024-02-10 MED ORDER — SODIUM CHLORIDE 0.9 % IV SOLN: INTRAVENOUS | Status: AC

## 2024-02-10 MED ORDER — SODIUM CHLORIDE 0.9 % IV SOLN
200.0000 mg | Freq: Once | INTRAVENOUS | Status: AC
  Administered 2024-02-10: 200 mg via INTRAVENOUS
  Filled 2024-02-10: qty 200

## 2024-02-10 MED ORDER — CETIRIZINE HCL 10 MG PO TABS
10.0000 mg | ORAL_TABLET | Freq: Once | ORAL | Status: AC
  Administered 2024-02-10: 10 mg via ORAL
  Filled 2024-02-10: qty 1

## 2024-02-10 NOTE — Progress Notes (Signed)
 [facility]   [phone]) [phone] [phone]) [phone]   Clinic Follow up Note   Patient Care Team: [name], MD as PCP - General (Internal Medicine) [name], RN as Oncology Nurse Navigator [name], MD as Consulting Physician (General Surgery) [name], MD as Consulting Physician (Hematology) [name], MD as Consulting Physician (Radiation Oncology) [name], MD as Consulting Physician (Endocrinology) [name], MD as Consulting Physician (Sports Medicine) [name], MD as Consulting Physician (Obstetrics and Gynecology) [name], [name], NP as Nurse Practitioner (Nurse Practitioner) Imaging, [facility] as Radiologist (Diagnostic Radiology) Kermit Lear Danas, DO as Referring Physician (Endocrinology)  Date of Service:  02/10/2024  CHIEF COMPLAINT: f/u of recurrent triple negative breast cancer  CURRENT THERAPY:  Adriamycin, Cytoxan  and Keytruda  every 3 weeks  Oncology History   Ductal carcinoma in situ (DCIS) of left breast -diagnosed in 12/05/2022, ER-/PR-, g2 -s/p left lumpectomy by Dr. Aron on January 01, 2023.  Surgical path showed intermediate grade DCIS with necrosis, margins were negative. -She has completed adjuvant radiation -No role for adjuvant antiestrogen therapy -Continue breast cancer surveillance  Malignant neoplasm of upper-outer quadrant of right breast in female, estrogen receptor negative (HCC) IDC and DCIS, Stage IA, pT1b, cN0, triple negative, Grade 3, chest wall recurrence in 10/2023  -found on screening mammogram. S/p lumpectomy on 01/08/22 with Dr. Aron, path showed: 8 mm invasive and in situ ductal carcinoma with negative margins. Repeat prognostic panel confirmed triple negative disease.  -lymph node biopsies 01/28/22 showed negative nodes (0/2). Port placed at time of procedure. Postoperative course complicated by pneumothorax, which has resolved. -she began  adjuvant TC on 02/20/22, and completed planned 4 cycles on 04/24/2022. -she completed adjuvant RT on 07/09/2022  -biopsy confirmed chest lymph nodes (axilla and chest wall including posteriad pectoralis muscle)  recurrence in 10/2023 - Case reviewed in breast tumor board, plan to start neoadjuvant chemotherapy with carboplatin  and paclitaxel  every 3 weeks for 4 cycles, followed by AC every 3 weeks for 4 cycles, along with immunotherapy Keytruda  for 1 year.  If she has good response to treatment, Dr. Aron will consider surgery although she is not able to remove the aubpectoral lymph node.  She may need adjuvant radiation after surgery.  Assessment & Plan Triple negative breast cancer of right upper-outer quadrant Triple negative breast cancer in the right upper-outer quadrant. Recent CT scan shows tumor shrinkage. Chemotherapy has reduced tumor size but may not eliminate all cancer cells. Surgical removal of lymph nodes is challenging due to location, especially behind the muscle. Radiation therapy is a potential option to target remaining cancer cells. Molecular testing is planned to explore additional targeted therapy options. - Continue chemotherapy regimen every three weeks until December 31st. - Ordered PET scan in December to assess tumor response. - Requested molecular testing ([place] or [facility]) on biopsy sample from July 11th to explore targeted therapy options. - Will schedule echocardiogram in mid-December to monitor cardiac function. - Will discuss surgical options with Dr. Aron and consider alternative surgeons if necessary. - Will consider additional radiation therapy as a local treatment option.  Chemotherapy-induced peripheral neuropathy Peripheral neuropathy characterized by tingling and pain, primarily affecting fine motor skills. Gabapentin  and Cymbalta  were previously tried but caused adverse effects such as drowsiness and bizarre dreams. Lyrica has not been tried yet. -  Recommended over-the-counter B12 or B complex supplementation. - Encouraged exercise to potentially alleviate symptoms.  Fatigue secondary to chemotherapy Fatigue is a common  side effect of chemotherapy, expected to improve with the extra week off between cycles. Nausea and appetite issues are anticipated to recover better with the current regimen. - Continue current chemotherapy regimen with an extra week off between cycles to manage fatigue.  Chemotherapy-induced neutropenia, currently resolved Neutropenia secondary to chemotherapy, currently resolved. Blood counts are stable with white count at 3.1, hemoglobin at 10.2, and normal platelet count. - Will administer GCSF injection on day three of the chemotherapy cycle to support white blood cell recovery.  Plan - Lab reviewed, adequate for treatment, will proceed for cycle AC and Keytruda  today - Follow-up in 3 weeks before cycle 2 treatment - Her case was reviewed in tumor board this morning, Dr. Aron does not think she can remove all her disease, but she will see her to discuss surgery.   SUMMARY OF ONCOLOGIC HISTORY: Oncology History Overview Note   Cancer Staging  Malignant neoplasm of upper-outer quadrant of right breast in female, estrogen receptor negative Staging form: Breast, AJCC 8th Edition - Clinical stage from 12/23/2021: cT1b, cM0, G3, ER-, PR-, HER2- - Unsigned Stage prefix: Initial diagnosis Histologic grading system: 3 grade system - Pathologic stage from 01/08/2022: Stage IB (pT1b, pN0, cM0, G3, ER-, PR-, HER2-) - Signed by [name], MD on 01/24/2022 Stage prefix: Initial diagnosis Histologic grading system: 3 grade system Residual tumor (R): R0 - None     Malignant neoplasm of upper-outer quadrant of right breast in female, estrogen receptor negative (HCC)  12/06/2021 Mammogram   CLINICAL DATA:  Patient returns today to evaluate RIGHT breast calcifications identified on a recent screening mammogram.    EXAM: DIGITAL DIAGNOSTIC UNILATERAL RIGHT MAMMOGRAM  IMPRESSION: Grouped coarse heterogeneous calcifications within the upper-outer quadrant of the RIGHT breast, measuring 6 mm extent. These may be fibroadenomatous calcifications. Stereotactic biopsy is recommended to exclude malignancy.   12/18/2021 Initial Biopsy   Diagnosis Breast, right, needle core biopsy, upper outer quadrant, x clip - DUCTAL CARCINOMA IN SITU, SOLID AND CRIBRIFORM TYPE WITH COMEDONECROSIS AND ASSOCIATED CALCIFICATIONS, NUCLEAR GRADE 3 OF 3 - FOCAL MICROINVASION IS PRESENT (LESS THAN 1 MM) WITH EVIDENCE OF LYMPHOVASCULAR INVASION - NECROSIS: PRESENT - CALCIFICATIONS: PRESENT - DCIS LENGTH: 7 MM IN GREATEST LINEAR DIMENSION ON FRAGMENTED CORES  PROGNOSTIC INDICATORS Results: IMMUNOHISTOCHEMICAL AND MORPHOMETRIC ANALYSIS PERFORMED MANUALLY The tumor cells are NEGATIVE for Her2 (1+). Estrogen Receptor: 0%, NEGATIVE Progesterone Receptor: 0%, NEGATIVE Proliferation Marker Ki67: 60%   12/23/2021 Initial Diagnosis   Malignant neoplasm of upper-outer quadrant of right breast in female, estrogen receptor negative (HCC)   12/23/2021 Imaging   EXAM: ULTRASOUND OF THE RIGHT AXILLA  IMPRESSION: No abnormal appearing RIGHT axillary lymph nodes.   01/08/2022 Cancer Staging   Staging form: Breast, AJCC 8th Edition - Pathologic stage from 01/08/2022: Stage IB (pT1b, pN0, cM0, G3, ER-, PR-, HER2-) - Signed by [name], MD on 01/24/2022 Stage prefix: Initial diagnosis Histologic grading system: 3 grade system Residual tumor (R): R0 - None   02/20/2022 - 04/24/2022 Chemotherapy   Patient is on Treatment Plan : BREAST TC q21d     10/06/2022 Survivorship   SCP delivered by Lacie [name], NP   11/13/2023 -  Chemotherapy   Patient is on Treatment Plan : BREAST Pembrolizumab  (200) D1 + Carboplatin  (5) D1 + Paclitaxel  (80) D1,8,15 q21d X 4 cycles / Pembrolizumab  (200) D1 + AC D1 q21d x 4 cycles     Ductal carcinoma in situ  (DCIS) of left breast  12/09/2022 Initial Diagnosis   Ductal carcinoma  in situ (DCIS) of left breast   01/01/2023 Surgery   Let breast seed localized lumpectomy.with Dr. Aron  2.5 cm DCIS with negative margins.     02/11/2023 - 03/10/2023 Radiation Therapy   Dose per fraction - 2.66 Gy Prescribed dose (delivered/prescribed)  42.56/42.56 Prescribed Fxs (delivered/prescribed)  16/16  Boost  Dose per fraction -  2Gy Prescribed dose (delivered/prescribed) 8Gy/8Gy) Prescribed Fxs (delivered/prescribed) (4Gy/4Gy)        Discussed the use of AI scribe software for clinical note transcription with the patient, who gave verbal consent to proceed.  History of Present Illness Samantha [name] is a 63 year old female with breast cancer who presents for follow-up.  She experiences neuropathy with tingling and pain, but no numbness. This neuropathy is irritating during the day but does not disturb her sleep. Gabapentin  and Cymbalta  caused drowsiness and bizarre dreams. She has not tried Lyrica or B12 supplements.  She is undergoing chemotherapy, with the last session scheduled for December 31st, 2025, and receives treatment every three weeks. She experiences fatigue, nausea, and low appetite as typical side effects. Her blood counts are monitored regularly, with a recent white count of 3.1, hemoglobin of 10.2, and normal platelet and neutrophil counts. She receives an injection on day three post-infusion to aid in white blood cell recovery.  She mentions a call regarding potential surgery, but a final scan has not been completed. She is aware that the tumor board discussed her case and there are concerns about the feasibility of removing all lymph nodes due to their location.     All other systems were reviewed with the patient and are negative.  MEDICAL HISTORY:  Past Medical History:  Diagnosis Date   Allergy    Anxiety 2015   Result of culmination of super stressful caregiving and deaths  of 2 immediate family members.  Overall do pretty well.   Cancer [facility]) 2023   right breast   Coronary artery calcification    Depression    denies   GERD (gastroesophageal reflux disease)    Hyperlipidemia    Hypothyroidism    Right carpal tunnel syndrome 09/19/2019   Thyroid  disease     SURGICAL HISTORY: Past Surgical History:  Procedure Laterality Date   BREAST BIOPSY Left 12/04/2022   MM LT BREAST BX W LOC DEV 1ST LESION IMAGE BX SPEC STEREO GUIDE 12/04/2022 [facility]   BREAST BIOPSY  12/30/2022   MM LT RADIOACTIVE SEED LOC MAMMO GUIDE 12/30/2022 [facility]   BREAST LUMPECTOMY WITH RADIOACTIVE SEED LOCALIZATION Right 01/08/2022   Procedure: RIGHT BREAST LUMPECTOMY WITH RADIOACTIVE SEED LOCALIZATION;  Surgeon: [name], MD;  Location: [facility] OR;  Service: General;  Laterality: Right;   BREAST LUMPECTOMY WITH RADIOACTIVE SEED LOCALIZATION Left 01/01/2023   Procedure: LEFT BREAST SEED LOCALIZED LUMPECTOMY;  Surgeon: [name], MD;  Location: [facility] OR;  Service: General;  Laterality: Left;   PORT-A-CATH REMOVAL Left 05/20/2022   Procedure: REMOVAL PORT-A-CATH;  Surgeon: [name], MD;  Location: [facility];  Service: General;  Laterality: Left;   PORTACATH PLACEMENT Left 01/28/2022   Procedure: INSERTION PORT-A-CATH;  Surgeon: [name], MD;  Location: [facility];  Service: General;  Laterality: Left;   PORTACATH PLACEMENT Left 11/04/2023   Procedure: INSERTION, TUNNELED CENTRAL VENOUS DEVICE, WITH PORT;  Surgeon: [name], MD;  Location: [facility];  Service: General;  Laterality: Left;  PORT PLACEMENT WITH ULTRASOUND GUIDANCE   SENTINEL NODE BIOPSY Right 01/28/2022   Procedure:  RIGHT SENTINEL LYMPH NODE BIOPSY;  Surgeon: [name], MD;  Location: [facility];  Service: General;  Laterality: Right;   TONSILLECTOMY      I have reviewed the social history and family history with the patient and they  are unchanged from previous note.  ALLERGIES:  is allergic to nortriptyline , singulair  [montelukast  sodium], sulfonamide derivatives, amitriptyline  hcl, esomeprazole, fluticasone , gabapentin , lexapro [escitalopram], neomycin, paclitaxel , pseudoephedrine, and neosporin [bacitracin-polymyxin b].  MEDICATIONS:  Current Outpatient Medications  Medication Sig Dispense Refill   acetaminophen  (TYLENOL ) 325 MG tablet Take 650 mg by mouth every 6 (six) hours as needed.     ALPRAZolam  (XANAX ) 0.5 MG tablet Take 1 tablet (0.5 mg total) by mouth daily as needed for anxiety. 30 tablet 1   atorvastatin (LIPITOR) 20 MG tablet Take 20 mg by mouth every evening.     betamethasone dipropionate 0.05 % cream Apply 1 Application topically 2 (two) times daily as needed (skin irritation.).     Calcium  Carb-Cholecalciferol (CALCIUM -VITAMIN D3) 250-125 MG-UNIT TABS Take 1 tablet by mouth daily with lunch.     Cholecalciferol (VITAMIN D3) 50 MCG (2000 UT) TABS Take 2,000 Units by mouth daily with lunch.     Clobetasol  Prop Emollient Base 0.05 % emollient cream Apply 1 Application topically 2 (two) times daily. 60 g 1   ezetimibe (ZETIA) 10 MG tablet Take 10 mg by mouth every evening.     famotidine  (PEPCID ) 20 MG tablet Take 1 tablet (20 mg total) by mouth daily. (Patient taking differently: Take 20 mg by mouth daily as needed for heartburn or indigestion.)     Fezolinetant (VEOZAH) 45 MG TABS Take 45 mg by mouth in the morning. (Patient not taking: Reported on 11/26/2023)     HYDROcodone -acetaminophen  (NORCO/VICODIN) 5-325 MG tablet Take 1 tablet by mouth every 6 (six) hours as needed for moderate pain (pain score 4-6). 15 tablet 0   lidocaine -prilocaine  (EMLA ) cream Apply to affected area once 30 g 3   loperamide (IMODIUM) 2 MG capsule Take by mouth as needed for diarrhea or loose stools.     magic mouthwash (nystatin , diphenhydrAMINE , alum & mag hydroxide) suspension mixture Swish and spit 5 mLs 4 (four) times daily as  needed for mouth pain. 140 mL 1   nystatin  (MYCOSTATIN ) 100000 UNIT/ML suspension Take 5 mLs (500,000 Units total) by mouth 4 (four) times daily. 473 mL 0   nystatin -triamcinolone (MYCOLOG II) cream Apply 1 Application topically 2 (two) times daily as needed (irritation).     ondansetron  (ZOFRAN ) 8 MG tablet Take 1 tablet (8 mg total) by mouth every 8 (eight) hours as needed for nausea or vomiting. Start on the third day after chemotherapy. 30 tablet 1   pantoprazole  (PROTONIX ) 40 MG tablet Take 1 tablet (40 mg total) by mouth 2 (two) times daily. 60 tablet 2   prochlorperazine  (COMPAZINE ) 10 MG tablet Take 1 tablet (10 mg total) by mouth every 6 (six) hours as needed for nausea or vomiting. 30 tablet 1   sertraline  (ZOLOFT ) 50 MG tablet Take 50 mg by mouth in the morning.     tacrolimus (PROTOPIC) 0.03 % ointment Apply 1 Application topically 2 (two) times daily as needed (skin irritation.).     TIROSINT  100 MCG CAPS Take 100 mcg by mouth See admin instructions. Take 1 capsule (100 mcg) by mouth every 3 days, alternating with (Tirosint  88 mcg)--take on an empty stomach.     TIROSINT  88 MCG CAPS Take 88 mcg by mouth See admin  instructions. Take 1 capsule (88 mcg) by mouth 2 days in a row, alternating with Tirosint  (100 mcg) on the 3 rd day.     triamcinolone ointment (KENALOG) 0.1 % Apply 1 Application topically 2 (two) times daily as needed (skin irritation.).     valACYclovir (VALTREX) 500 MG tablet Take 500 mg by mouth 2 (two) times daily as needed (cold sore/fever blisters).     No current facility-administered medications for this visit.   Facility-Administered Medications Ordered in Other Visits  Medication Dose Route Frequency Provider Last Rate Last Admin   0.9 %  sodium chloride  infusion   Intravenous Continuous [name], MD 500 mL/hr at 02/10/24 1417 New Bag at 02/10/24 1417   aprepitant  (CINVANTI ) injection 130 mg  130 mg Intravenous Once [name], MD       cetirizine  (ZYRTEC ) tablet  10 mg  10 mg Oral Once [name], MD       cyclophosphamide  (CYTOXAN ) 1,180 mg in sodium chloride  0.9 % 250 mL chemo infusion  600 mg/m2 (Treatment Plan Recorded) Intravenous Once [name], MD       DOXOrubicin (ADRIAMYCIN) chemo injection 118 mg  60 mg/m2 (Treatment Plan Recorded) Intravenous Once [name], MD       palonosetron  (ALOXI ) injection 0.25 mg  0.25 mg Intravenous Once [name], MD       pembrolizumab  (KEYTRUDA ) 200 mg in sodium chloride  0.9 % 50 mL chemo infusion  200 mg Intravenous Once [name], MD        PHYSICAL EXAMINATION: ECOG PERFORMANCE STATUS: 1  Vitals:   02/10/24 1302  BP: 110/60  Pulse: 81  Resp: 17  Temp: 97.6 F (36.4 C)  SpO2: 98%   Wt Readings from Last 3 Encounters:  02/10/24 150 lb 8 oz (68.3 kg)  02/01/24 152 lb 4.8 oz (69.1 kg)  01/27/24 154 lb 1.6 oz (69.9 kg)     GENERAL:alert, no distress and comfortable SKIN: skin color, texture, turgor are normal, no rashes or significant lesions EYES: normal, Conjunctiva are pink and non-injected, sclera clear NECK: supple, thyroid  normal size, non-tender, without nodularity LYMPH:  no palpable lymphadenopathy in the cervical, axillary  LUNGS: clear to auscultation and percussion with normal breathing effort HEART: regular rate & rhythm and no murmurs and no lower extremity edema ABDOMEN:abdomen soft, non-tender and normal bowel sounds Musculoskeletal:no cyanosis of digits and no clubbing  NEURO: alert & oriented x 3 with fluent speech, no focal motor/sensory deficits  Physical Exam    LABORATORY DATA:  I have reviewed the data as listed    Latest Ref Rng & Units 02/10/2024   12:36 PM 01/27/2024   12:43 PM 01/21/2024    1:06 PM  CBC  WBC 4.0 - 10.5 K/uL 3.1  1.2  1.3   Hemoglobin 12.0 - 15.0 g/dL 89.7  9.6  9.2   Hematocrit 36.0 - 46.0 % 30.4  28.1  26.8   Platelets 150 - 400 K/uL 168  138  63         Latest Ref Rng & Units 02/10/2024   12:36 PM 01/27/2024   12:43 PM 01/21/2024     1:06 PM  CMP  Glucose 70 - 99 mg/dL 892  857  872   BUN 8 - 23 mg/dL 12  13  13    Creatinine 0.44 - 1.00 mg/dL 9.50  9.48  9.53   Sodium 135 - 145 mmol/L 140  140  139   Potassium 3.5 - 5.1 mmol/L 3.7  3.9  3.9   Chloride 98 - 111 mmol/L 104  106  105   CO2 22 - 32 mmol/L 30  29  30    Calcium  8.9 - 10.3 mg/dL 9.3  9.8  9.7   Total Protein 6.5 - 8.1 g/dL 7.1  7.2  7.4   Total Bilirubin 0.0 - 1.2 mg/dL 0.5  0.4  0.4   Alkaline Phos 38 - 126 U/L 161  116  101   AST 15 - 41 U/L 43  43  27   ALT 0 - 44 U/L 59  63  37       RADIOGRAPHIC STUDIES: I have personally reviewed the radiological images as listed and agreed with the findings in the report. No results found.    Orders Placed This Encounter  Procedures   CBC with Differential (Cancer Center Only)    Standing Status:   Future    Expected Date:   03/23/2024    Expiration Date:   03/23/2025   CMP (Cancer Center only)    Standing Status:   Future    Expected Date:   03/23/2024    Expiration Date:   03/23/2025   CBC with Differential (Cancer Center Only)    Standing Status:   Future    Expected Date:   04/13/2024    Expiration Date:   04/13/2025   CMP (Cancer Center only)    Standing Status:   Future    Expected Date:   04/13/2024    Expiration Date:   04/13/2025   All questions were answered. The patient knows to call the clinic with any problems, questions or concerns. No barriers to learning was detected. The total time spent in the appointment was 30 minutes, including review of chart and various tests results, discussions about plan of care and coordination of care plan     Onita Mattock, MD 02/10/2024

## 2024-02-10 NOTE — Patient Instructions (Addendum)
 [facility] - A DEPT OF [facility]  Discharge Instructions: Thank you for choosing [facility] to provide your oncology and hematology care.   If you have a lab appointment with the Cancer Center, please go directly to the Cancer Center and check in at the registration area.   Wear comfortable clothing and clothing appropriate for easy access to any Portacath or PICC line.   We strive to give you quality time with your provider. You may need to reschedule your appointment if you arrive late (15 or more minutes).  Arriving late affects you and other patients whose appointments are after yours.  Also, if you miss three or more appointments without notifying the office, you may be dismissed from the clinic at the provider's discretion.      For prescription refill requests, have your pharmacy contact our office and allow 72 hours for refills to be completed.    Today you received the following chemotherapy and/or immunotherapy agents: Pembrolizumab  (Keytruda ), Doxorubicin (Adriamycin), and Cyclophosphamide  (Cytoxan )      To help prevent nausea and vomiting after your treatment, we encourage you to take your nausea medication as directed.  BELOW ARE SYMPTOMS THAT SHOULD BE REPORTED IMMEDIATELY: *FEVER GREATER THAN 100.4 F (38 C) OR HIGHER *CHILLS OR SWEATING *NAUSEA AND VOMITING THAT IS NOT CONTROLLED WITH YOUR NAUSEA MEDICATION *UNUSUAL SHORTNESS OF BREATH *UNUSUAL BRUISING OR BLEEDING *URINARY PROBLEMS (pain or burning when urinating, or frequent urination) *BOWEL PROBLEMS (unusual diarrhea, constipation, pain near the anus) TENDERNESS IN MOUTH AND THROAT WITH OR WITHOUT PRESENCE OF ULCERS (sore throat, sores in mouth, or a toothache) UNUSUAL RASH, SWELLING OR PAIN  UNUSUAL VAGINAL DISCHARGE OR ITCHING   Items with * indicate a potential emergency and should be followed up as soon as possible or go to the Emergency Department if any problems  should occur.  Please show the CHEMOTHERAPY ALERT CARD or IMMUNOTHERAPY ALERT CARD at check-in to the Emergency Department and triage nurse.  Should you have questions after your visit or need to cancel or reschedule your appointment, please contact [facility] - A DEPT OF [facility]  Dept: [phone]  and follow the prompts.  Office hours are 8:00 a.m. to 4:30 p.m. Monday - Friday. Please note that voicemails left after 4:00 p.m. may not be returned until the following business day.  We are closed weekends and major holidays. You have access to a nurse at all times for urgent questions. Please call the main number to the clinic Dept: [phone] and follow the prompts.   For any non-urgent questions, you may also contact your provider using MyChart. We now offer e-Visits for anyone 89 and older to request care online for non-urgent symptoms. For details visit [email or web address].   Also download the MyChart app! Go to the app store, search MyChart, open the app, select [facility], and log in with your MyChart username and password.  Doxorubicin Injection What is this medication? DOXORUBICIN (dox oh ROO bi sin) treats some types of cancer. It works by slowing down the growth of cancer cells. This medicine may be used for other purposes; ask your health care provider or pharmacist if you have questions. COMMON BRAND NAME(S): Adriamycin, Adriamycin PFS, Adriamycin RDF, Rubex What should I tell my care team before I take this medication? They need to know if you have any of these conditions: Heart disease History of low blood cell levels  caused by a medication Liver disease Recent or ongoing radiation An unusual or allergic reaction to doxorubicin, other medications, foods, dyes, or preservatives If you or your partner are pregnant or trying to get pregnant Breast-feeding How should I use this medication? This medication is injected into a vein. It is  given by your care team in a hospital or clinic setting. Talk to your care team about the use of this medication in children. Special care may be needed. Overdosage: If you think you have taken too much of this medicine contact a poison control center or emergency room at once. NOTE: This medicine is only for you. Do not share this medicine with others. What if I miss a dose? Keep appointments for follow-up doses. It is important not to miss your dose. Call your care team if you are unable to keep an appointment. What may interact with this medication? 6-mercaptopurine Paclitaxel  Phenytoin St. John's wort Trastuzumab Verapamil This list may not describe all possible interactions. Give your health care provider a list of all the medicines, herbs, non-prescription drugs, or dietary supplements you use. Also tell them if you smoke, drink alcohol, or use illegal drugs. Some items may interact with your medicine. What should I watch for while using this medication? Your condition will be monitored carefully while you are receiving this medication. You may need blood work while taking this medication. This medication may make you feel generally unwell. This is not uncommon as chemotherapy can affect healthy cells as well as cancer cells. Report any side effects. Continue your course of treatment even though you feel ill unless your care team tells you to stop. There is a maximum amount of this medication you should receive throughout your life. The amount depends on the medical condition being treated and your overall health. Your care team will watch how much of this medication you receive. Tell your care team if you have taken this medication before. Your urine may turn red for a few days after your dose. This is not blood. If your urine is dark or brown, call your care team. In some cases, you may be given additional medications to help with side effects. Follow all directions for their use. This  medication may increase your risk of getting an infection. Call your care team for advice if you get a fever, chills, sore throat, or other symptoms of a cold or flu. Do not treat yourself. Try to avoid being around people who are sick. This medication may increase your risk to bruise or bleed. Call your care team if you notice any unusual bleeding. Talk to your care team about your risk of cancer. You may be more at risk for certain types of cancers if you take this medication. Talk to your care team if you or your partner may be pregnant. Serious birth defects can occur if you take this medication during pregnancy and for 6 months after the last dose. Contraception is recommended while taking this medication and for 6 months after the last dose. Your care team can help you find the option that works for you. If your partner can get pregnant, use a condom while taking this medication and for 6 months after the last dose. Do not breastfeed while taking this medication. This medication may cause infertility. Talk to your care team if you are concerned about your fertility. What side effects may I notice from receiving this medication? Side effects that you should report to your care team as soon  as possible: Allergic reactions--skin rash, itching, hives, swelling of the face, lips, tongue, or throat Heart failure--shortness of breath, swelling of the ankles, feet, or hands, sudden weight gain, unusual weakness or fatigue Heart rhythm changes--fast or irregular heartbeat, dizziness, feeling faint or lightheaded, chest pain, trouble breathing Infection--fever, chills, cough, sore throat, wounds that don't heal, pain or trouble when passing urine, general feeling of discomfort or being unwell Low red blood cell level--unusual weakness or fatigue, dizziness, headache, trouble breathing Painful swelling, warmth, or redness of the skin, blisters or sores at the infusion site Unusual bruising or  bleeding Side effects that usually do not require medical attention (report to your care team if they continue or are bothersome): Diarrhea Hair loss Nausea Pain, redness, or swelling with sores inside the mouth or throat Red urine This list may not describe all possible side effects. Call your doctor for medical advice about side effects. You may report side effects to FDA at 1-800-FDA-1088. Where should I keep my medication? This medication is given in a hospital or clinic. It will not be stored at home. NOTE: This sheet is a summary. It may not cover all possible information. If you have questions about this medicine, talk to your doctor, pharmacist, or health care provider.  2024 Elsevier/Gold Standard (2022-07-03 00:00:00)  Cyclophosphamide  Injection What is this medication? CYCLOPHOSPHAMIDE  (sye kloe FOSS fa mide) treats some types of cancer. It works by slowing down the growth of cancer cells. This medicine may be used for other purposes; ask your health care provider or pharmacist if you have questions. COMMON BRAND NAME(S): Cyclophosphamide , Cytoxan , Neosar  What should I tell my care team before I take this medication? They need to know if you have any of these conditions: Heart disease Irregular heartbeat or rhythm Infection Kidney problems Liver disease Low blood cell levels (white cells, platelets, or red blood cells) Lung disease Previous radiation Trouble passing urine An unusual or allergic reaction to cyclophosphamide , other medications, foods, dyes, or preservatives Pregnant or trying to get pregnant Breast-feeding How should I use this medication? This medication is injected into a vein. It is given by your care team in a hospital or clinic setting. Talk to your care team about the use of this medication in children. Special care may be needed. Overdosage: If you think you have taken too much of this medicine contact a poison control center or emergency room at  once. NOTE: This medicine is only for you. Do not share this medicine with others. What if I miss a dose? Keep appointments for follow-up doses. It is important not to miss your dose. Call your care team if you are unable to keep an appointment. What may interact with this medication? Amphotericin B Amiodarone Azathioprine Certain antivirals for HIV or hepatitis Certain medications for blood pressure, such as enalapril, lisinopril, quinapril Cyclosporine Diuretics Etanercept Indomethacin Medications that relax muscles Metronidazole Natalizumab Tamoxifen Warfarin This list may not describe all possible interactions. Give your health care provider a list of all the medicines, herbs, non-prescription drugs, or dietary supplements you use. Also tell them if you smoke, drink alcohol, or use illegal drugs. Some items may interact with your medicine. What should I watch for while using this medication? This medication may make you feel generally unwell. This is not uncommon as chemotherapy can affect healthy cells as well as cancer cells. Report any side effects. Continue your course of treatment even though you feel ill unless your care team tells you to stop. You may  need blood work while you are taking this medication. This medication may increase your risk of getting an infection. Call your care team for advice if you get a fever, chills, sore throat, or other symptoms of a cold or flu. Do not treat yourself. Try to avoid being around people who are sick. Avoid taking medications that contain aspirin , acetaminophen , ibuprofen , naproxen, or ketoprofen unless instructed by your care team. These medications may hide a fever. Be careful brushing or flossing your teeth or using a toothpick because you may get an infection or bleed more easily. If you have any dental work done, tell your dentist you are receiving this medication. Drink water or other fluids as directed. Urinate often, even at  night. Some products may contain alcohol. Ask your care team if this medication contains alcohol. Be sure to tell all care teams you are taking this medicine. Certain medicines, like metronidazole and disulfiram, can cause an unpleasant reaction when taken with alcohol. The reaction includes flushing, headache, nausea, vomiting, sweating, and increased thirst. The reaction can last from 30 minutes to several hours. Talk to your care team if you wish to become pregnant or think you might be pregnant. This medication can cause serious birth defects if taken during pregnancy and for 1 year after the last dose. A negative pregnancy test is required before starting this medication. A reliable form of contraception is recommended while taking this medication and for 1 year after the last dose. Talk to your care team about reliable forms of contraception. Do not father a child while taking this medication and for 4 months after the last dose. Use a condom during this time period. Do not breast-feed while taking this medication or for 1 week after the last dose. This medication may cause infertility. Talk to your care team if you are concerned about your fertility. Talk to your care team about your risk of cancer. You may be more at risk for certain types of cancer if you take this medication. What side effects may I notice from receiving this medication? Side effects that you should report to your care team as soon as possible: Allergic reactions--skin rash, itching, hives, swelling of the face, lips, tongue, or throat Dry cough, shortness of breath or trouble breathing Heart failure--shortness of breath, swelling of the ankles, feet, or hands, sudden weight gain, unusual weakness or fatigue Heart muscle inflammation--unusual weakness or fatigue, shortness of breath, chest pain, fast or irregular heartbeat, dizziness, swelling of the ankles, feet, or hands Heart rhythm changes--fast or irregular heartbeat,  dizziness, feeling faint or lightheaded, chest pain, trouble breathing Infection--fever, chills, cough, sore throat, wounds that don't heal, pain or trouble when passing urine, general feeling of discomfort or being unwell Kidney injury--decrease in the amount of urine, swelling of the ankles, hands, or feet Liver injury--right upper belly pain, loss of appetite, nausea, light-colored stool, dark yellow or brown urine, yellowing skin or eyes, unusual weakness or fatigue Low red blood cell level--unusual weakness or fatigue, dizziness, headache, trouble breathing Low sodium level--muscle weakness, fatigue, dizziness, headache, confusion Red or dark brown urine Unusual bruising or bleeding Side effects that usually do not require medical attention (report to your care team if they continue or are bothersome): Hair loss Irregular menstrual cycles or spotting Loss of appetite Nausea Pain, redness, or swelling with sores inside the mouth or throat Vomiting This list may not describe all possible side effects. Call your doctor for medical advice about side effects. You may report  side effects to FDA at 1-800-FDA-1088. Where should I keep my medication? This medication is given in a hospital or clinic. It will not be stored at home. NOTE: This sheet is a summary. It may not cover all possible information. If you have questions about this medicine, talk to your doctor, pharmacist, or health care provider.  2024 Elsevier/Gold Standard (2021-08-16 00:00:00)

## 2024-02-11 ENCOUNTER — Other Ambulatory Visit: Payer: Self-pay

## 2024-02-11 ENCOUNTER — Telehealth: Payer: Self-pay

## 2024-02-11 NOTE — Telephone Encounter (Signed)
-----   Message from Nurse Oza S sent at 02/10/2024  4:51 PM EDT ----- Regarding: Dr. Lanny 1st time A/C Patient received first time doxorubicin and cytoxan  on 02/10/24 (had received Taxol /Carboplatin  in the past). Tolerated well and is coming in on 10/31 for injection. Thanks!

## 2024-02-12 ENCOUNTER — Inpatient Hospital Stay

## 2024-02-12 DIAGNOSIS — C50411 Malignant neoplasm of upper-outer quadrant of right female breast: Secondary | ICD-10-CM

## 2024-02-12 DIAGNOSIS — Z5112 Encounter for antineoplastic immunotherapy: Secondary | ICD-10-CM | POA: Diagnosis not present

## 2024-02-12 MED ORDER — PEGFILGRASTIM-JMDB 6 MG/0.6ML ~~LOC~~ SOSY
6.0000 mg | PREFILLED_SYRINGE | Freq: Once | SUBCUTANEOUS | Status: AC
  Administered 2024-02-12: 6 mg via SUBCUTANEOUS
  Filled 2024-02-12: qty 0.6

## 2024-02-15 ENCOUNTER — Telehealth: Payer: Self-pay | Admitting: Hematology

## 2024-02-15 NOTE — Telephone Encounter (Signed)
 Called pt and spoke with pt  in regarding  her upcoming appts. Pt is aware  of her appts

## 2024-02-16 ENCOUNTER — Encounter: Payer: Self-pay | Admitting: *Deleted

## 2024-02-16 ENCOUNTER — Other Ambulatory Visit: Payer: Self-pay

## 2024-02-19 DIAGNOSIS — C50411 Malignant neoplasm of upper-outer quadrant of right female breast: Secondary | ICD-10-CM | POA: Diagnosis not present

## 2024-02-19 DIAGNOSIS — Z171 Estrogen receptor negative status [ER-]: Secondary | ICD-10-CM | POA: Diagnosis not present

## 2024-02-19 DIAGNOSIS — C50112 Malignant neoplasm of central portion of left female breast: Secondary | ICD-10-CM | POA: Diagnosis not present

## 2024-02-25 ENCOUNTER — Encounter: Payer: Self-pay | Admitting: Hematology

## 2024-02-25 NOTE — Progress Notes (Deleted)
 Servando Neth, MD Reason for referral-coronary calcification  HPI: 63 year old female for evaluation of coronary calcification at request of [name], MD.  Echocardiogram July 2025 showed normal LV function, no significant valvular disease.  Calcium  score February 2025 49 which was 75th percentile.  Chest and abdominal CT September 2025 showed radiation pneumonitis/fibrosis, unchanged lesion in the left hilum no evidence of lymphadenopathy or metastatic disease.  Cardiology now asked to evaluate.  Current Outpatient Medications  Medication Sig Dispense Refill   acetaminophen  (TYLENOL ) 325 MG tablet Take 650 mg by mouth every 6 (six) hours as needed.     ALPRAZolam  (XANAX ) 0.5 MG tablet Take 1 tablet (0.5 mg total) by mouth daily as needed for anxiety. 30 tablet 1   atorvastatin (LIPITOR) 20 MG tablet Take 20 mg by mouth every evening.     betamethasone dipropionate 0.05 % cream Apply 1 Application topically 2 (two) times daily as needed (skin irritation.).     Calcium  Carb-Cholecalciferol (CALCIUM -VITAMIN D3) 250-125 MG-UNIT TABS Take 1 tablet by mouth daily with lunch.     Cholecalciferol (VITAMIN D3) 50 MCG (2000 UT) TABS Take 2,000 Units by mouth daily with lunch.     Clobetasol  Prop Emollient Base 0.05 % emollient cream Apply 1 Application topically 2 (two) times daily. 60 g 1   ezetimibe (ZETIA) 10 MG tablet Take 10 mg by mouth every evening.     famotidine  (PEPCID ) 20 MG tablet Take 1 tablet (20 mg total) by mouth daily. (Patient taking differently: Take 20 mg by mouth daily as needed for heartburn or indigestion.)     Fezolinetant (VEOZAH) 45 MG TABS Take 45 mg by mouth in the morning. (Patient not taking: Reported on 11/26/2023)     HYDROcodone -acetaminophen  (NORCO/VICODIN) 5-325 MG tablet Take 1 tablet by mouth every 6 (six) hours as needed for moderate pain (pain score 4-6). 15 tablet 0   lidocaine -prilocaine  (EMLA ) cream Apply to affected area once 30 g 3    loperamide (IMODIUM) 2 MG capsule Take by mouth as needed for diarrhea or loose stools.     magic mouthwash (nystatin , diphenhydrAMINE , alum & mag hydroxide) suspension mixture Swish and spit 5 mLs 4 (four) times daily as needed for mouth pain. 140 mL 1   nystatin  (MYCOSTATIN ) 100000 UNIT/ML suspension Take 5 mLs (500,000 Units total) by mouth 4 (four) times daily. 473 mL 0   nystatin -triamcinolone (MYCOLOG II) cream Apply 1 Application topically 2 (two) times daily as needed (irritation).     ondansetron  (ZOFRAN ) 8 MG tablet Take 1 tablet (8 mg total) by mouth every 8 (eight) hours as needed for nausea or vomiting. Start on the third day after chemotherapy. 30 tablet 1   pantoprazole  (PROTONIX ) 40 MG tablet Take 1 tablet (40 mg total) by mouth 2 (two) times daily. 60 tablet 2   prochlorperazine  (COMPAZINE ) 10 MG tablet Take 1 tablet (10 mg total) by mouth every 6 (six) hours as needed for nausea or vomiting. 30 tablet 1   sertraline  (ZOLOFT ) 50 MG tablet Take 50 mg by mouth in the morning.     tacrolimus (PROTOPIC) 0.03 % ointment Apply 1 Application topically 2 (two) times daily as needed (skin irritation.).     TIROSINT  100 MCG CAPS Take 100 mcg by mouth See admin instructions. Take 1 capsule (100 mcg) by mouth every 3 days, alternating with (Tirosint  88 mcg)--take on an empty stomach.     TIROSINT  88 MCG CAPS Take 88 mcg by mouth See admin instructions.  Take 1 capsule (88 mcg) by mouth 2 days in a row, alternating with Tirosint  (100 mcg) on the 3 rd day.     triamcinolone ointment (KENALOG) 0.1 % Apply 1 Application topically 2 (two) times daily as needed (skin irritation.).     valACYclovir (VALTREX) 500 MG tablet Take 500 mg by mouth 2 (two) times daily as needed (cold sore/fever blisters).     No current facility-administered medications for this visit.    Allergies  Allergen Reactions   Nortriptyline  Other (See Comments)    depression   Singulair  [Montelukast  Sodium] Other (See Comments)     Depressed mood   Sulfonamide Derivatives Nausea And Vomiting   Amitriptyline  Hcl     Depression mood   Esomeprazole Hives   Fluticasone      Other Reaction(s): may have caused hearing loss   Gabapentin      Hair Loss   Lexapro [Escitalopram]     Depression mood   Neomycin     Other Reaction(s): topical irritates the skin badly.   Paclitaxel     Pseudoephedrine     Other Reaction(s): HEART RACES   Neosporin [Bacitracin-Polymyxin B] Rash     Past Medical History:  Diagnosis Date   Allergy    Anxiety 2015   Result of culmination of super stressful caregiving and deaths of 2 immediate family members.  Overall do pretty well.   Cancer [facility]) 2023   right breast   Coronary artery calcification    Depression    denies   GERD (gastroesophageal reflux disease)    Hyperlipidemia    Hypothyroidism    Right carpal tunnel syndrome 09/19/2019   Thyroid  disease     Past Surgical History:  Procedure Laterality Date   BREAST BIOPSY Left 12/04/2022   MM LT BREAST BX W LOC DEV 1ST LESION IMAGE BX SPEC STEREO GUIDE 12/04/2022 [facility]   BREAST BIOPSY  12/30/2022   MM LT RADIOACTIVE SEED LOC MAMMO GUIDE 12/30/2022 [facility]   BREAST LUMPECTOMY WITH RADIOACTIVE SEED LOCALIZATION Right 01/08/2022   Procedure: RIGHT BREAST LUMPECTOMY WITH RADIOACTIVE SEED LOCALIZATION;  Surgeon: [name], MD;  Location: [facility] OR;  Service: General;  Laterality: Right;   BREAST LUMPECTOMY WITH RADIOACTIVE SEED LOCALIZATION Left 01/01/2023   Procedure: LEFT BREAST SEED LOCALIZED LUMPECTOMY;  Surgeon: [name], MD;  Location: [facility] OR;  Service: General;  Laterality: Left;   PORT-A-CATH REMOVAL Left 05/20/2022   Procedure: REMOVAL PORT-A-CATH;  Surgeon: [name], MD;  Location: [facility];  Service: General;  Laterality: Left;   PORTACATH PLACEMENT Left 01/28/2022   Procedure: INSERTION PORT-A-CATH;  Surgeon: [name], MD;  Location: [facility];  Service:  General;  Laterality: Left;   PORTACATH PLACEMENT Left 11/04/2023   Procedure: INSERTION, TUNNELED CENTRAL VENOUS DEVICE, WITH PORT;  Surgeon: [name], MD;  Location: [facility];  Service: General;  Laterality: Left;  PORT PLACEMENT WITH ULTRASOUND GUIDANCE   SENTINEL NODE BIOPSY Right 01/28/2022   Procedure: RIGHT SENTINEL LYMPH NODE BIOPSY;  Surgeon: [name], MD;  Location:  SURGERY CENTER;  Service: General;  Laterality: Right;   TONSILLECTOMY      Social History   Socioeconomic History   Marital status: Married    Spouse name: Not on file   Number of children: 1   Years of education: Not on file   Highest education level: Not on file  Occupational History   Not on file  Tobacco Use   Smoking status: Never  Smokeless tobacco: Never  Vaping Use   Vaping status: Never Used  Substance and Sexual Activity   Alcohol use: Yes    Comment: maybe 1 drink 4 times a year   Drug use: No   Sexual activity: Not Currently    Birth control/protection: Post-menopausal  Other Topics Concern   Not on file  Social History Narrative   Not on file   Social Drivers of Health   Financial Resource Strain: Not on file  Food Insecurity: No Food Insecurity (01/28/2023)   Hunger Vital Sign    Worried About Running Out of Food in the Last Year: Never true    Ran Out of Food in the Last Year: Never true  Transportation Needs: No Transportation Needs (01/28/2023)   PRAPARE - Administrator, Civil Service (Medical): No    Lack of Transportation (Non-Medical): No  Physical Activity: Not on file  Stress: Not on file  Social Connections: Not on file  Intimate Partner Violence: Not At Risk (01/28/2023)   Humiliation, Afraid, Rape, and Kick questionnaire    Fear of Current or Ex-Partner: No    Emotionally Abused: No    Physically Abused: No    Sexually Abused: No    Family History  Problem Relation Age of Onset   Hyperlipidemia Father     Diabetes Maternal Grandmother     ROS: no fevers or chills, productive cough, hemoptysis, dysphasia, odynophagia, melena, hematochezia, dysuria, hematuria, rash, seizure activity, orthopnea, PND, pedal edema, claudication. Remaining systems are negative.  Physical Exam:   There were no vitals taken for this visit.  General:  Well developed/well nourished in NAD Skin warm/dry Patient not depressed No peripheral clubbing Back-normal HEENT-normal/normal eyelids Neck supple/normal carotid upstroke bilaterally; no bruits; no JVD; no thyromegaly chest - CTA/ normal expansion CV - RRR/normal S1 and S2; no murmurs, rubs or gallops;  PMI nondisplaced Abdomen -NT/ND, no HSM, no mass, + bowel sounds, no bruit 2+ femoral pulses, no bruits Ext-no edema, chords, 2+ DP Neuro-grossly nonfocal  ECG - personally reviewed  A/P  1 coronary calcification-  2 hyperlipidemia-  Redell Shallow, MD

## 2024-02-29 ENCOUNTER — Other Ambulatory Visit: Payer: Self-pay | Admitting: Hematology

## 2024-02-29 ENCOUNTER — Telehealth: Payer: Self-pay | Admitting: Nurse Practitioner

## 2024-02-29 ENCOUNTER — Inpatient Hospital Stay

## 2024-02-29 DIAGNOSIS — C50411 Malignant neoplasm of upper-outer quadrant of right female breast: Secondary | ICD-10-CM

## 2024-02-29 NOTE — Telephone Encounter (Signed)
 Pt called in to cancel the appt.

## 2024-03-01 ENCOUNTER — Other Ambulatory Visit (HOSPITAL_COMMUNITY): Payer: Self-pay | Admitting: General Surgery

## 2024-03-01 DIAGNOSIS — C50411 Malignant neoplasm of upper-outer quadrant of right female breast: Secondary | ICD-10-CM

## 2024-03-01 DIAGNOSIS — C50911 Malignant neoplasm of unspecified site of right female breast: Secondary | ICD-10-CM

## 2024-03-01 NOTE — Assessment & Plan Note (Signed)
 IDC and DCIS, Stage IA, pT1b, cN0, triple negative, Grade 3, chest wall recurrence in 10/2023  -found on screening mammogram. S/p lumpectomy on 01/08/22 with Dr. Aron, path showed: 8 mm invasive and in situ ductal carcinoma with negative margins. Repeat prognostic panel confirmed triple negative disease.  -lymph node biopsies 01/28/22 showed negative nodes (0/2). Port placed at time of procedure. Postoperative course complicated by pneumothorax, which has resolved. -she began adjuvant TC on 02/20/22, and completed planned 4 cycles on 04/24/2022. -she completed adjuvant RT on 07/09/2022  -biopsy confirmed chest lymph nodes (axilla and chest wall including posteriad pectoralis muscle)  recurrence in 10/2023 - Case reviewed in breast tumor board, plan to start neoadjuvant chemotherapy with carboplatin  and paclitaxel  every 3 weeks for 4 cycles, followed by AC every 3 weeks for 4 cycles, along with immunotherapy Keytruda  for 1 year.  If she has good response to treatment, Dr. Aron will consider surgery although she is not able to remove the aubpectoral lymph node.  She may need adjuvant radiation after surgery.

## 2024-03-01 NOTE — Assessment & Plan Note (Signed)
-  diagnosed in 12/05/2022, ER-/PR-, g2 -s/p left lumpectomy by Dr. Donell Beers on January 01, 2023.  Surgical path showed intermediate grade DCIS with necrosis, margins were negative. -She has completed adjuvant radiation -No role for adjuvant antiestrogen therapy -Continue breast cancer surveillance

## 2024-03-02 ENCOUNTER — Inpatient Hospital Stay (HOSPITAL_BASED_OUTPATIENT_CLINIC_OR_DEPARTMENT_OTHER): Admitting: Hematology

## 2024-03-02 ENCOUNTER — Inpatient Hospital Stay: Admitting: Dietician

## 2024-03-02 ENCOUNTER — Encounter: Payer: Self-pay | Admitting: Hematology

## 2024-03-02 ENCOUNTER — Inpatient Hospital Stay: Attending: Hematology

## 2024-03-02 ENCOUNTER — Inpatient Hospital Stay

## 2024-03-02 ENCOUNTER — Telehealth: Payer: Self-pay

## 2024-03-02 VITALS — BP 130/72 | HR 119 | Temp 97.3°F | Resp 17 | Ht 67.0 in | Wt 139.5 lb

## 2024-03-02 DIAGNOSIS — G4710 Hypersomnia, unspecified: Secondary | ICD-10-CM | POA: Diagnosis not present

## 2024-03-02 DIAGNOSIS — E860 Dehydration: Secondary | ICD-10-CM | POA: Diagnosis not present

## 2024-03-02 DIAGNOSIS — Z171 Estrogen receptor negative status [ER-]: Secondary | ICD-10-CM

## 2024-03-02 DIAGNOSIS — Z9089 Acquired absence of other organs: Secondary | ICD-10-CM | POA: Insufficient documentation

## 2024-03-02 DIAGNOSIS — Z79899 Other long term (current) drug therapy: Secondary | ICD-10-CM | POA: Insufficient documentation

## 2024-03-02 DIAGNOSIS — Z1722 Progesterone receptor negative status: Secondary | ICD-10-CM | POA: Diagnosis not present

## 2024-03-02 DIAGNOSIS — D0512 Intraductal carcinoma in situ of left breast: Secondary | ICD-10-CM

## 2024-03-02 DIAGNOSIS — C50411 Malignant neoplasm of upper-outer quadrant of right female breast: Secondary | ICD-10-CM | POA: Diagnosis not present

## 2024-03-02 DIAGNOSIS — Z888 Allergy status to other drugs, medicaments and biological substances status: Secondary | ICD-10-CM | POA: Diagnosis not present

## 2024-03-02 DIAGNOSIS — Z881 Allergy status to other antibiotic agents status: Secondary | ICD-10-CM | POA: Insufficient documentation

## 2024-03-02 DIAGNOSIS — R630 Anorexia: Secondary | ICD-10-CM | POA: Insufficient documentation

## 2024-03-02 DIAGNOSIS — T451X5A Adverse effect of antineoplastic and immunosuppressive drugs, initial encounter: Secondary | ICD-10-CM | POA: Diagnosis not present

## 2024-03-02 DIAGNOSIS — Z86000 Personal history of in-situ neoplasm of breast: Secondary | ICD-10-CM

## 2024-03-02 DIAGNOSIS — Z882 Allergy status to sulfonamides status: Secondary | ICD-10-CM | POA: Insufficient documentation

## 2024-03-02 DIAGNOSIS — Z1732 Human epidermal growth factor receptor 2 negative status: Secondary | ICD-10-CM | POA: Insufficient documentation

## 2024-03-02 DIAGNOSIS — R112 Nausea with vomiting, unspecified: Secondary | ICD-10-CM | POA: Diagnosis not present

## 2024-03-02 LAB — CBC WITH DIFFERENTIAL (CANCER CENTER ONLY)
Abs Immature Granulocytes: 0.04 K/uL (ref 0.00–0.07)
Basophils Absolute: 0 K/uL (ref 0.0–0.1)
Basophils Relative: 1 %
Eosinophils Absolute: 0 K/uL (ref 0.0–0.5)
Eosinophils Relative: 0 %
HCT: 30.2 % — ABNORMAL LOW (ref 36.0–46.0)
Hemoglobin: 10 g/dL — ABNORMAL LOW (ref 12.0–15.0)
Immature Granulocytes: 1 %
Lymphocytes Relative: 7 %
Lymphs Abs: 0.4 K/uL — ABNORMAL LOW (ref 0.7–4.0)
MCH: 35.6 pg — ABNORMAL HIGH (ref 26.0–34.0)
MCHC: 33.1 g/dL (ref 30.0–36.0)
MCV: 107.5 fL — ABNORMAL HIGH (ref 80.0–100.0)
Monocytes Absolute: 0.6 K/uL (ref 0.1–1.0)
Monocytes Relative: 12 %
Neutro Abs: 4.2 K/uL (ref 1.7–7.7)
Neutrophils Relative %: 79 %
Platelet Count: 274 K/uL (ref 150–400)
RBC: 2.81 MIL/uL — ABNORMAL LOW (ref 3.87–5.11)
RDW: 15.6 % — ABNORMAL HIGH (ref 11.5–15.5)
WBC Count: 5.3 K/uL (ref 4.0–10.5)
nRBC: 0 % (ref 0.0–0.2)

## 2024-03-02 LAB — CMP (CANCER CENTER ONLY)
ALT: 308 U/L (ref 0–44)
AST: 225 U/L (ref 15–41)
Albumin: 4.3 g/dL (ref 3.5–5.0)
Alkaline Phosphatase: 860 U/L — ABNORMAL HIGH (ref 38–126)
Anion gap: 11 (ref 5–15)
BUN: 10 mg/dL (ref 8–23)
CO2: 28 mmol/L (ref 22–32)
Calcium: 9.7 mg/dL (ref 8.9–10.3)
Chloride: 100 mmol/L (ref 98–111)
Creatinine: 0.49 mg/dL (ref 0.44–1.00)
GFR, Estimated: >60 mL/min (ref >60)
Glucose, Bld: 137 mg/dL — ABNORMAL HIGH (ref 70–99)
Potassium: 3.5 mmol/L (ref 3.5–5.1)
Sodium: 140 mmol/L (ref 135–145)
Total Bilirubin: 0.4 mg/dL (ref 0.0–1.2)
Total Protein: 7.4 g/dL (ref 6.5–8.1)

## 2024-03-02 LAB — TSH: TSH: 0.381 u[IU]/mL (ref 0.350–4.500)

## 2024-03-02 MED ORDER — SODIUM CHLORIDE 0.9 % IV SOLN: INTRAVENOUS | Status: AC

## 2024-03-02 NOTE — Progress Notes (Signed)
 Planning to see patient during infusion for nutrition follow-up. Spoke with infusion RN. Patient treatment held. She received IVF and left prior to RD visit. Will reschedule appointment.

## 2024-03-02 NOTE — Telephone Encounter (Signed)
 Critical Lab Value: AST 225; ALT 308; Tbili 0.4  Notified Dr Lanny and her team.  Chemo held today and IVF given

## 2024-03-02 NOTE — Addendum Note (Signed)
 Addended by: Demetri Kerman P on: 03/02/2024 01:39 PM   Modules accepted: Orders

## 2024-03-02 NOTE — Progress Notes (Signed)
 [facility]   [phone] [phone]   Clinic Follow up Note   Patient Care Team: [name], MD as PCP - General (Internal Medicine) [name], RN as Oncology Nurse Navigator [name], MD as Consulting Physician (General Surgery) [name], MD as Consulting Physician (Hematology) [name], MD as Consulting Physician (Radiation Oncology) [name], MD as Consulting Physician (Endocrinology) [name], MD as Consulting Physician (Sports Medicine) [name], MD as Consulting Physician (Obstetrics and Gynecology) [name], [name], NP as Nurse Practitioner (Nurse Practitioner) Imaging, [facility] as Radiologist (Diagnostic Radiology) Kermit Lear Danas, DO as Referring Physician (Endocrinology)  Date of Service:  03/02/2024  CHIEF COMPLAINT: f/u of triple negative breast cancer  CURRENT THERAPY:  Chemotherapy AC and Keytruda  every 3 weeks  Oncology History   Malignant neoplasm of upper-outer quadrant of right breast in female, estrogen receptor negative (HCC) IDC and DCIS, Stage IA, pT1b, cN0, triple negative, Grade 3, chest wall recurrence in 10/2023  -found on screening mammogram. S/p lumpectomy on 01/08/22 with Dr. Aron, path showed: 8 mm invasive and in situ ductal carcinoma with negative margins. Repeat prognostic panel confirmed triple negative disease.  -lymph node biopsies 01/28/22 showed negative nodes (0/2). Port placed at time of procedure. Postoperative course complicated by pneumothorax, which has resolved. -she began adjuvant TC on 02/20/22, and completed planned 4 cycles on 04/24/2022. -she completed adjuvant RT on 07/09/2022  -biopsy confirmed chest lymph nodes (axilla and chest wall including posteriad pectoralis muscle)  recurrence in 10/2023 - Case reviewed in breast tumor board, plan to start neoadjuvant chemotherapy with carboplatin  and paclitaxel  every 3 weeks for 4 cycles,  followed by AC every 3 weeks for 4 cycles, along with immunotherapy Keytruda  for 1 year.  If she has good response to treatment, Dr. Aron will consider surgery although she is not able to remove the aubpectoral lymph node.  She may need adjuvant radiation after surgery.  Ductal carcinoma in situ (DCIS) of left breast -diagnosed in 12/05/2022, ER-/PR-, g2 -s/p left lumpectomy by Dr. Aron on January 01, 2023.  Surgical path showed intermediate grade DCIS with necrosis, margins were negative. -She has completed adjuvant radiation -No role for adjuvant antiestrogen therapy -Continue breast cancer surveillance  Assessment & Plan Breast cancer (right upper-outer quadrant) undergoing chemotherapy Recurrent triple negative right breast cancer is being treated with chemotherapy. The last cycle AC was extremely difficult, causing significant fatigue, anorexia, and nausea. She is not ready for the next chemotherapy session due to these side effects. A PET scan is scheduled to assess the response to treatment, but insurance has denied it. The importance of the PET scan for potential surgical intervention was discussed. If the PET scan shows improvement but not complete resolution, chemotherapy may be reduced in dose to minimize side effects. If the PET scan shows significant improvement, chemotherapy may be stopped earlier, continuing with Keytruda  immunotherapy. - Held chemotherapy today - Administered IV fluids to boost energy - Will appeal insurance decision for PET scan - Will coordinate with Dr. Aron regarding PET scan approval - Will consider reducing chemotherapy dose if PET scan shows partial response - Will consider stopping chemotherapy earlier if PET scan shows significant improvement  Chemotherapy-induced fatigue, anorexia, and nausea Severe fatigue, anorexia, and nausea following the last chemotherapy cycle. She experiences extreme fatigue, inability to eat or drink adequately, and  occasional vomiting. Compazine  was used but caused excessive sleepiness. She is unable to engage in regular activities and  requires assistance with mobility. Blood counts are normal, indicating that the side effects are primarily due to chemotherapy rather than cancer progression. - Administered IV fluids to address dehydration and fatigue - Encouraged high-protein, high-calorie intake including protein shakes, Ensure, Boost, milk, and ice cream - Advised light physical activity as tolerated to regain strength - Coordinated with dietitian for nutritional support and evaluation of protein supplements - Canceled scheduled injection for Friday  Plan - She has not recovered well from her first cycle of AC, will hold chemotherapy today and give IV fluids - I will reach out to Dr. Aron regarding her PET scan insurance approval - Follow-up in 3 weeks before next cycle chemo   SUMMARY OF ONCOLOGIC HISTORY: Oncology History Overview Note   Cancer Staging  Malignant neoplasm of upper-outer quadrant of right breast in female, estrogen receptor negative Staging form: Breast, AJCC 8th Edition - Clinical stage from 12/23/2021: cT1b, cM0, G3, ER-, PR-, HER2- - Unsigned Stage prefix: Initial diagnosis Histologic grading system: 3 grade system - Pathologic stage from 01/08/2022: Stage IB (pT1b, pN0, cM0, G3, ER-, PR-, HER2-) - Signed by [name], MD on 01/24/2022 Stage prefix: Initial diagnosis Histologic grading system: 3 grade system Residual tumor (R): R0 - None     Malignant neoplasm of upper-outer quadrant of right breast in female, estrogen receptor negative (HCC)  12/06/2021 Mammogram   CLINICAL DATA:  Patient returns today to evaluate RIGHT breast calcifications identified on a recent screening mammogram.   EXAM: DIGITAL DIAGNOSTIC UNILATERAL RIGHT MAMMOGRAM  IMPRESSION: Grouped coarse heterogeneous calcifications within the upper-outer quadrant of the RIGHT breast, measuring 6 mm extent.  These may be fibroadenomatous calcifications. Stereotactic biopsy is recommended to exclude malignancy.   12/18/2021 Initial Biopsy   Diagnosis Breast, right, needle core biopsy, upper outer quadrant, x clip - DUCTAL CARCINOMA IN SITU, SOLID AND CRIBRIFORM TYPE WITH COMEDONECROSIS AND ASSOCIATED CALCIFICATIONS, NUCLEAR GRADE 3 OF 3 - FOCAL MICROINVASION IS PRESENT (LESS THAN 1 MM) WITH EVIDENCE OF LYMPHOVASCULAR INVASION - NECROSIS: PRESENT - CALCIFICATIONS: PRESENT - DCIS LENGTH: 7 MM IN GREATEST LINEAR DIMENSION ON FRAGMENTED CORES  PROGNOSTIC INDICATORS Results: IMMUNOHISTOCHEMICAL AND MORPHOMETRIC ANALYSIS PERFORMED MANUALLY The tumor cells are NEGATIVE for Her2 (1+). Estrogen Receptor: 0%, NEGATIVE Progesterone Receptor: 0%, NEGATIVE Proliferation Marker Ki67: 60%   12/23/2021 Initial Diagnosis   Malignant neoplasm of upper-outer quadrant of right breast in female, estrogen receptor negative (HCC)   12/23/2021 Imaging   EXAM: ULTRASOUND OF THE RIGHT AXILLA  IMPRESSION: No abnormal appearing RIGHT axillary lymph nodes.   01/08/2022 Cancer Staging   Staging form: Breast, AJCC 8th Edition - Pathologic stage from 01/08/2022: Stage IB (pT1b, pN0, cM0, G3, ER-, PR-, HER2-) - Signed by [name], MD on 01/24/2022 Stage prefix: Initial diagnosis Histologic grading system: 3 grade system Residual tumor (R): R0 - None   02/20/2022 - 04/24/2022 Chemotherapy   Patient is on Treatment Plan : BREAST TC q21d     10/06/2022 Survivorship   SCP delivered by Lacie [name], NP   11/13/2023 -  Chemotherapy   Patient is on Treatment Plan : BREAST Pembrolizumab  (200) D1 + Carboplatin  (5) D1 + Paclitaxel  (80) D1,8,15 q21d X 4 cycles / Pembrolizumab  (200) D1 + AC D1 q21d x 4 cycles     Ductal carcinoma in situ (DCIS) of left breast  12/09/2022 Initial Diagnosis   Ductal carcinoma in situ (DCIS) of left breast   01/01/2023 Surgery   Let breast seed localized lumpectomy.with Dr. Aron  2.5 cm DCIS  with negative margins.     02/11/2023 - 03/10/2023 Radiation Therapy   Dose per fraction - 2.66 Gy Prescribed dose (delivered/prescribed)  42.56/42.56 Prescribed Fxs (delivered/prescribed)  16/16  Boost  Dose per fraction -  2Gy Prescribed dose (delivered/prescribed) 8Gy/8Gy) Prescribed Fxs (delivered/prescribed) (4Gy/4Gy)        Discussed the use of AI scribe software for clinical note transcription with the patient, who gave verbal consent to proceed.  History of Present Illness Samantha [name] is a 62 year old female with breast cancer who presents for follow-up after chemotherapy.  She experiences extreme fatigue and significant weakness, with dizziness upon standing, following her recent chemotherapy cycle. She is mostly sedentary, with limited activity. Nausea and vomiting occurred for the first time during this cycle, with two episodes of vomiting described as 'yellow bile' and frequent dry heaves. She struggles with maintaining adequate hydration and nutrition, finding it challenging to eat and drink enough despite efforts to consume high-protein, high-calorie foods. Compazine  was taken for nausea but caused excessive sleepiness, further impacting her ability to eat and drink. No recent diarrhea or swelling of the legs. Bowel movements are small but occur daily.     All other systems were reviewed with the patient and are negative.  MEDICAL HISTORY:  Past Medical History:  Diagnosis Date   Allergy    Anxiety 2015   Result of culmination of super stressful caregiving and deaths of 2 immediate family members.  Overall do pretty well.   Cancer [facility]) 2023   right breast   Coronary artery calcification    Depression    denies   GERD (gastroesophageal reflux disease)    Hyperlipidemia    Hypothyroidism    Right carpal tunnel syndrome 09/19/2019   Thyroid  disease     SURGICAL HISTORY: Past Surgical History:  Procedure Laterality Date   BREAST BIOPSY Left 12/04/2022    MM LT BREAST BX W LOC DEV 1ST LESION IMAGE BX SPEC STEREO GUIDE 12/04/2022 [facility]   BREAST BIOPSY  12/30/2022   MM LT RADIOACTIVE SEED LOC MAMMO GUIDE 12/30/2022 [facility]   BREAST LUMPECTOMY WITH RADIOACTIVE SEED LOCALIZATION Right 01/08/2022   Procedure: RIGHT BREAST LUMPECTOMY WITH RADIOACTIVE SEED LOCALIZATION;  Surgeon: [name], MD;  Location: [facility] OR;  Service: General;  Laterality: Right;   BREAST LUMPECTOMY WITH RADIOACTIVE SEED LOCALIZATION Left 01/01/2023   Procedure: LEFT BREAST SEED LOCALIZED LUMPECTOMY;  Surgeon: [name], MD;  Location: [facility] OR;  Service: General;  Laterality: Left;   PORT-A-CATH REMOVAL Left 05/20/2022   Procedure: REMOVAL PORT-A-CATH;  Surgeon: [name], MD;  Location: [facility];  Service: General;  Laterality: Left;   PORTACATH PLACEMENT Left 01/28/2022   Procedure: INSERTION PORT-A-CATH;  Surgeon: [name], MD;  Location: [facility];  Service: General;  Laterality: Left;   PORTACATH PLACEMENT Left 11/04/2023   Procedure: INSERTION, TUNNELED CENTRAL VENOUS DEVICE, WITH PORT;  Surgeon: [name], MD;  Location: [facility];  Service: General;  Laterality: Left;  PORT PLACEMENT WITH ULTRASOUND GUIDANCE   SENTINEL NODE BIOPSY Right 01/28/2022   Procedure: RIGHT SENTINEL LYMPH NODE BIOPSY;  Surgeon: [name], MD;  Location: [facility];  Service: General;  Laterality: Right;   TONSILLECTOMY      I have reviewed the social history and family history with the patient and they are unchanged from previous note.  ALLERGIES:  is allergic to nortriptyline , singulair  [montelukast  sodium], sulfonamide derivatives, amitriptyline  hcl, esomeprazole, fluticasone , gabapentin , lexapro [escitalopram],  neomycin, paclitaxel , pseudoephedrine, and neosporin [bacitracin-polymyxin b].  MEDICATIONS:  Current Outpatient Medications  Medication Sig Dispense Refill   acetaminophen  (TYLENOL )  325 MG tablet Take 650 mg by mouth every 6 (six) hours as needed.     ALPRAZolam  (XANAX ) 0.5 MG tablet Take 1 tablet (0.5 mg total) by mouth daily as needed for anxiety. 30 tablet 1   atorvastatin (LIPITOR) 20 MG tablet Take 20 mg by mouth every evening.     betamethasone dipropionate 0.05 % cream Apply 1 Application topically 2 (two) times daily as needed (skin irritation.).     Calcium  Carb-Cholecalciferol (CALCIUM -VITAMIN D3) 250-125 MG-UNIT TABS Take 1 tablet by mouth daily with lunch.     Cholecalciferol (VITAMIN D3) 50 MCG (2000 UT) TABS Take 2,000 Units by mouth daily with lunch.     Clobetasol  Prop Emollient Base 0.05 % emollient cream Apply 1 Application topically 2 (two) times daily. 60 g 1   ezetimibe (ZETIA) 10 MG tablet Take 10 mg by mouth every evening.     famotidine  (PEPCID ) 20 MG tablet Take 1 tablet (20 mg total) by mouth daily. (Patient taking differently: Take 20 mg by mouth daily as needed for heartburn or indigestion.)     Fezolinetant (VEOZAH) 45 MG TABS Take 45 mg by mouth in the morning. (Patient not taking: Reported on 11/26/2023)     HYDROcodone -acetaminophen  (NORCO/VICODIN) 5-325 MG tablet Take 1 tablet by mouth every 6 (six) hours as needed for moderate pain (pain score 4-6). 15 tablet 0   lidocaine -prilocaine  (EMLA ) cream Apply to affected area once 30 g 3   loperamide (IMODIUM) 2 MG capsule Take by mouth as needed for diarrhea or loose stools.     magic mouthwash (nystatin , diphenhydrAMINE , alum & mag hydroxide) suspension mixture Swish and spit 5 mLs 4 (four) times daily as needed for mouth pain. 140 mL 1   nystatin  (MYCOSTATIN ) 100000 UNIT/ML suspension Take 5 mLs (500,000 Units total) by mouth 4 (four) times daily. 473 mL 0   nystatin -triamcinolone (MYCOLOG II) cream Apply 1 Application topically 2 (two) times daily as needed (irritation).     ondansetron  (ZOFRAN ) 8 MG tablet Take 1 tablet (8 mg total) by mouth every 8 (eight) hours as needed for nausea or vomiting.  Start on the third day after chemotherapy. 30 tablet 1   pantoprazole  (PROTONIX ) 40 MG tablet Take 1 tablet (40 mg total) by mouth 2 (two) times daily. 60 tablet 2   prochlorperazine  (COMPAZINE ) 10 MG tablet Take 1 tablet (10 mg total) by mouth every 6 (six) hours as needed for nausea or vomiting. 30 tablet 1   sertraline  (ZOLOFT ) 50 MG tablet Take 50 mg by mouth in the morning.     tacrolimus (PROTOPIC) 0.03 % ointment Apply 1 Application topically 2 (two) times daily as needed (skin irritation.).     TIROSINT  100 MCG CAPS Take 100 mcg by mouth See admin instructions. Take 1 capsule (100 mcg) by mouth every 3 days, alternating with (Tirosint  88 mcg)--take on an empty stomach.     TIROSINT  88 MCG CAPS Take 88 mcg by mouth See admin instructions. Take 1 capsule (88 mcg) by mouth 2 days in a row, alternating with Tirosint  (100 mcg) on the 3 rd day.     triamcinolone ointment (KENALOG) 0.1 % Apply 1 Application topically 2 (two) times daily as needed (skin irritation.).     valACYclovir (VALTREX) 500 MG tablet Take 500 mg by mouth 2 (two) times daily as needed (cold sore/fever blisters).  No current facility-administered medications for this visit.    PHYSICAL EXAMINATION: ECOG PERFORMANCE STATUS: 2 - Symptomatic, <50% confined to bed  Vitals:   03/02/24 1100  BP: 130/72  Pulse: (!) 119  Resp: 17  Temp: (!) 97.3 F (36.3 C)  SpO2: 97%   Wt Readings from Last 3 Encounters:  03/02/24 139 lb 8 oz (63.3 kg)  02/10/24 150 lb 8 oz (68.3 kg)  02/01/24 152 lb 4.8 oz (69.1 kg)     GENERAL:alert, no distress and comfortable SKIN: skin color, texture, turgor are normal, no rashes or significant lesions EYES: normal, Conjunctiva are pink and non-injected, sclera clear NECK: supple, thyroid  normal size, non-tender, without nodularity LYMPH:  no palpable lymphadenopathy in the cervical, axillary  LUNGS: clear to auscultation and percussion with normal breathing effort HEART: regular rate &  rhythm and no murmurs and no lower extremity edema ABDOMEN:abdomen soft, non-tender and normal bowel sounds Musculoskeletal:no cyanosis of digits and no clubbing  NEURO: alert & oriented x 3 with fluent speech, no focal motor/sensory deficits  Physical Exam    LABORATORY DATA:  I have reviewed the data as listed    Latest Ref Rng & Units 03/02/2024   11:16 AM 02/10/2024   12:36 PM 01/27/2024   12:43 PM  CBC  WBC 4.0 - 10.5 K/uL 5.3  3.1  1.2   Hemoglobin 12.0 - 15.0 g/dL 89.9  89.7  9.6   Hematocrit 36.0 - 46.0 % 30.2  30.4  28.1   Platelets 150 - 400 K/uL 274  168  138         Latest Ref Rng & Units 03/02/2024   11:16 AM 02/10/2024   12:36 PM 01/27/2024   12:43 PM  CMP  Glucose 70 - 99 mg/dL 862  892  857   BUN 8 - 23 mg/dL 10  12  13    Creatinine 0.44 - 1.00 mg/dL 9.50  9.50  9.48   Sodium 135 - 145 mmol/L 140  140  140   Potassium 3.5 - 5.1 mmol/L 3.5  3.7  3.9   Chloride 98 - 111 mmol/L 100  104  106   CO2 22 - 32 mmol/L 28  30  29    Calcium  8.9 - 10.3 mg/dL 9.7  9.3  9.8   Total Protein 6.5 - 8.1 g/dL 7.4  7.1  7.2   Total Bilirubin 0.0 - 1.2 mg/dL 0.4  0.5  0.4   Alkaline Phos 38 - 126 U/L 860  161  116   AST 15 - 41 U/L 225  43  43   ALT 0 - 44 U/L 308  59  63       RADIOGRAPHIC STUDIES: I have personally reviewed the radiological images as listed and agreed with the findings in the report. No results found.    No orders of the defined types were placed in this encounter.  All questions were answered. The patient knows to call the clinic with any problems, questions or concerns. No barriers to learning was detected. The total time spent in the appointment was 30 minutes, including review of chart and various tests results, discussions about plan of care and coordination of care plan     Onita Mattock, MD 03/02/2024

## 2024-03-02 NOTE — Patient Instructions (Signed)

## 2024-03-02 NOTE — Telephone Encounter (Signed)
 Spoke with pt while in infusion to inform pt that her LFTs are elevated and Dr Lanny would like for the pt to stop taking Acetaminophen  and refrain from drinking alcohol if she's consuming alcohol.  Pt verbalized understanding and had not further questions or concerns at this time.

## 2024-03-03 ENCOUNTER — Encounter: Payer: Self-pay | Admitting: Hematology

## 2024-03-03 LAB — T4: T4, Total: 9.9 ug/dL (ref 4.5–12.0)

## 2024-03-04 ENCOUNTER — Inpatient Hospital Stay

## 2024-03-04 VITALS — BP 114/63 | HR 108 | Temp 97.9°F | Resp 18

## 2024-03-04 DIAGNOSIS — Z95828 Presence of other vascular implants and grafts: Secondary | ICD-10-CM

## 2024-03-04 DIAGNOSIS — E860 Dehydration: Secondary | ICD-10-CM

## 2024-03-04 DIAGNOSIS — C50411 Malignant neoplasm of upper-outer quadrant of right female breast: Secondary | ICD-10-CM

## 2024-03-04 MED ORDER — SODIUM CHLORIDE 0.9 % IV SOLN: Freq: Once | INTRAVENOUS | Status: AC

## 2024-03-04 NOTE — Patient Instructions (Signed)
 Fluids Given Through an IV (IV Infusion Therapy): What to Expect IV infusion therapy is a treatment to deliver a fluid, called an infusion, into a vein. You may have IV infusion to get: Fluids. Medicines. Nutrition. Chemotherapy. This is medicines to stop or slow down cancer cells. Blood or blood products. Dye that is given before an MRI or a CT scan. This is called contrast dye. Tell a health care provider about: Any allergies you have. This includes allergies to anesthesia or dyes. All medicines you take. These include vitamins, herbs, eye drops, and creams. Any bleeding problems you have. Any surgeries you've had, including if you've had lymph nodes taken out of your armpit or if you have a arteriovenous fistula for dialysis. Any medical problems you have. Whether you're pregnant or may be pregnant. Whether you've used IV drugs. What are the risks? Your health care provider will talk with you about risks. These may include: Pain, bruising, or bleeding. Infection. The IV leaking or moving out of place. Damage to blood vessels or nerves. Allergic reactions to medicines or dyes. A blood clot. An air bubble in the vein, also called an air embolism. What happens before the procedure? Eat and drink only as you've been told. Ask about changing or stopping: Any medicines you take. Any vitamins, herbs, or supplements you take. What happens during the procedure?     Placing the catheter Your skin at the IV site will be washed with fluid that kills germs. This will help prevent infection. IV infusion therapy starts with a procedure to place a soft tube called a catheter into a vein. An IV tube will be attached to the catheter to let the infusion flow into your blood. Your catheter may be placed: Into a vein that is usually in the bend of the elbow, forearm, or back of the hand. This is called a peripheral IV catheter. This may need to be put into a vein each time you get an  infusion. Into a vein near your elbow. This is called a midline catheter or a peripherally inserted central catheter (PICC). These types of catheters may stay in place for weeks or months at a time so you can receive repeated infusions through it. Into a vein near your neck that leads to your heart. This is called a non-tunneled catheter. This is only used for short amounts of time because it can cause infection. Through the skin of your chest and into a large vein that leads to your heart. This is called a tunneled catheter. This may stay in your body for months or years. Into an implanted port. An implanted port is a device that is surgically inserted under the skin of the chest to provide long-term IV access. The catheter will connect the port to a large vein in the chest or upper arm. A port may be kept in place for many months or years. Each time you have an infusion, a needle will be inserted through your skin to connect the catheter to the port. Doing the infusion To start the infusion, your provider will: Attach the IV tubing to your catheter. Use a tape or a bandage to hold the IV in place against your skin. An IV pump may be used to control the flow of the IV infusion. During the infusion, your provider will check the area to make sure: There is no bleeding, swelling, or pain. Your IV infusion is flowing correctly. After the infusion, your provider will: Take off the bandage  or tape. Disconnect the tubing from the catheter. Remove the catheter, if you have a peripheral IV. Apply pressure over the IV insertion site to stop bleeding, then cover the area with a bandage. If you have an implanted port, PICC, non-tunneled, or tunneled catheter, your catheter may remain in place. This depends on how many times you will need treatment, your medical condition, and what type of catheter you have. These steps may vary. Ask what you can expect. What can I expect after the procedure? You may be  watched closely until you leave. This includes checking your pain level, blood pressure, heart rate, and breathing rate. Your provider will check to make sure there are no signs of infection. Follow these instructions at home: Take your medicines only as told. Change or take off your bandage as told by your provider. Ask what things are safe for you to do at home. Ask when you can go back to work or school. Do not take baths, swim, or use a hot tub until you're told it's OK. Ask if you can shower. Check your IV insertion site every day for signs of infection. Check for: Redness, swelling, or pain. Fluid or blood. If fluid or blood drains from your IV site, use your hands to press down firmly on the area for a minute or two. Doing this should stop the bleeding. Warmth. Pus or a bad smell. Contact a health care provider if: You have signs of infection around your IV site. You have fluid or blood coming from your IV site that does not stop after you put pressure to the site. You have a rash or blisters. You have itchy, red, swollen areas of skin called hives. Get help right away if: You have a fever or chills. You have chest pain. You have trouble breathing. This information is not intended to replace advice given to you by your health care provider. Make sure you discuss any questions you have with your health care provider. Document Revised: 09/23/2022 Document Reviewed: 09/23/2022 Elsevier Patient Education  2024 [employer].

## 2024-03-06 ENCOUNTER — Other Ambulatory Visit: Payer: Self-pay

## 2024-03-07 ENCOUNTER — Ambulatory Visit: Admitting: Cardiology

## 2024-03-08 ENCOUNTER — Other Ambulatory Visit: Payer: Self-pay | Admitting: Hematology

## 2024-03-08 ENCOUNTER — Ambulatory Visit (HOSPITAL_COMMUNITY)

## 2024-03-11 ENCOUNTER — Ambulatory Visit (HOSPITAL_COMMUNITY)

## 2024-03-11 ENCOUNTER — Ambulatory Visit (HOSPITAL_COMMUNITY)
Admission: RE | Admit: 2024-03-11 | Discharge: 2024-03-11 | Disposition: A | Source: Ambulatory Visit | Attending: General Surgery | Admitting: General Surgery

## 2024-03-11 DIAGNOSIS — C50911 Malignant neoplasm of unspecified site of right female breast: Secondary | ICD-10-CM | POA: Diagnosis not present

## 2024-03-11 DIAGNOSIS — Z171 Estrogen receptor negative status [ER-]: Secondary | ICD-10-CM | POA: Diagnosis not present

## 2024-03-11 DIAGNOSIS — R911 Solitary pulmonary nodule: Secondary | ICD-10-CM | POA: Diagnosis not present

## 2024-03-11 DIAGNOSIS — C50411 Malignant neoplasm of upper-outer quadrant of right female breast: Secondary | ICD-10-CM | POA: Insufficient documentation

## 2024-03-11 DIAGNOSIS — R918 Other nonspecific abnormal finding of lung field: Secondary | ICD-10-CM | POA: Diagnosis not present

## 2024-03-11 LAB — GLUCOSE, CAPILLARY: Glucose-Capillary: 93 mg/dL (ref 70–99)

## 2024-03-11 MED ORDER — FLUDEOXYGLUCOSE F - 18 (FDG) INJECTION
6.9600 | Freq: Once | INTRAVENOUS | Status: AC | PRN
  Administered 2024-03-11: 6.96 via INTRAVENOUS

## 2024-03-14 IMAGING — XA Imaging study
2 series · 2 of 2 positions shown · non-contrast
Comparison: none

CLINICAL DATA: Spondylosis without myelopathy. Low back and left
radicular pain. Improvement from previous injections but with
recurrence of symptoms.

[Series 1: ortho standard · 1 of 1 slices shown (1 of 2)]
[im 1/1]
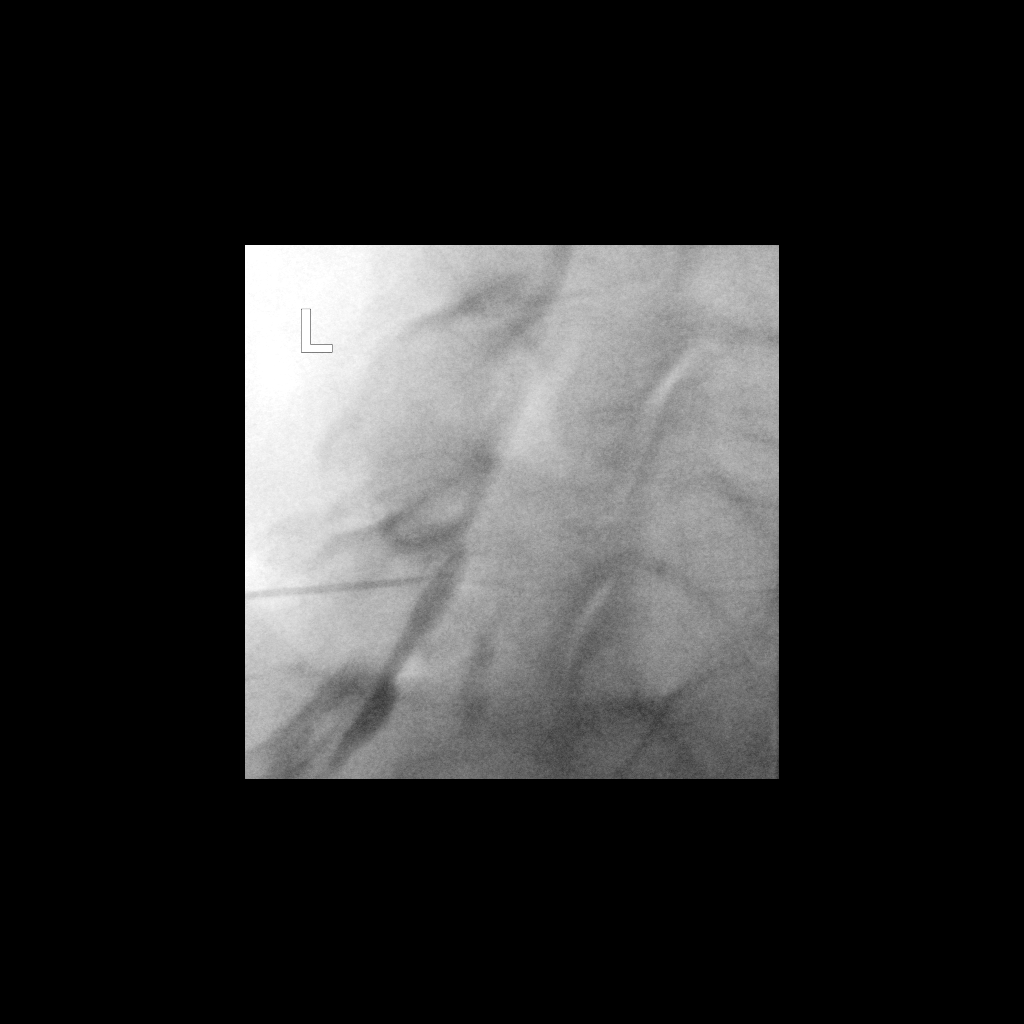

[Series 2: ortho standard · 1 of 1 slices shown (2 of 2)]
[im 1/1]
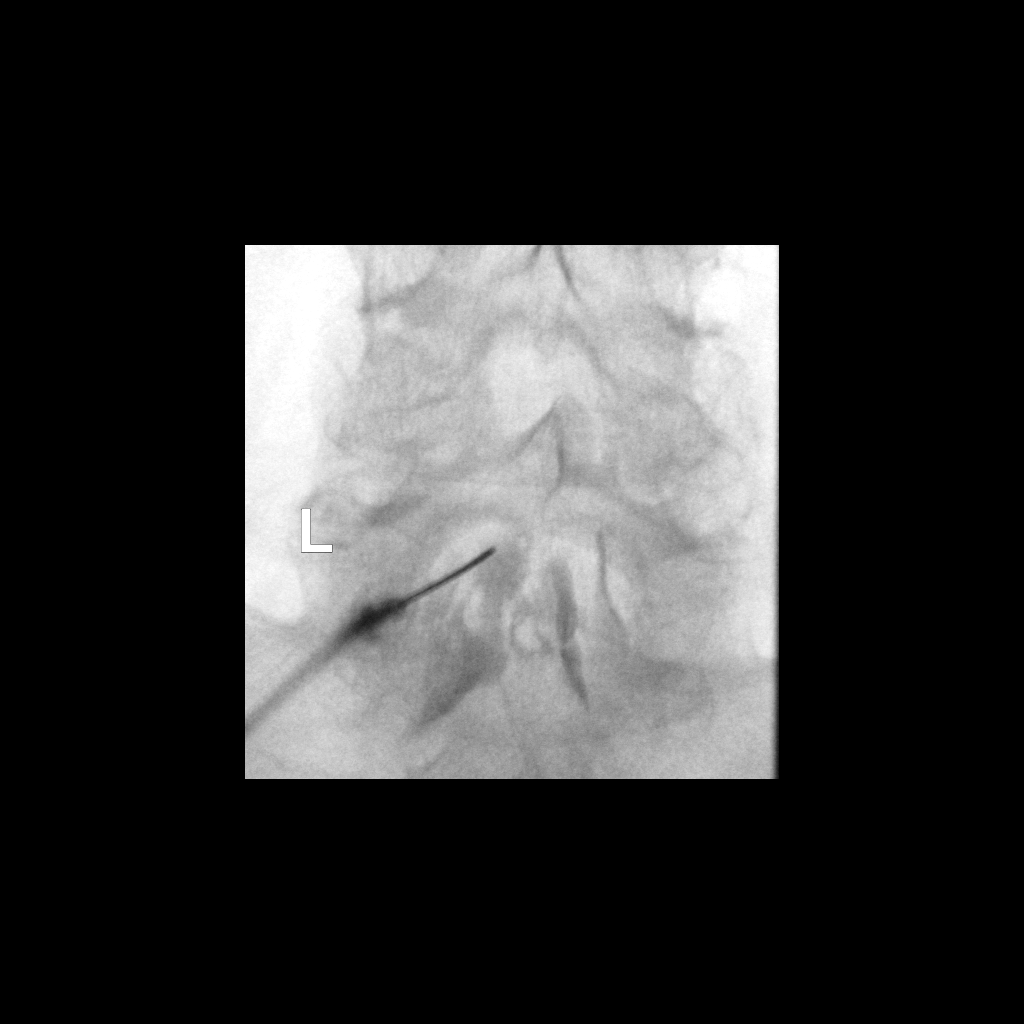

[2 of 2 positions shown; findings below may reference images not displayed]

FLUOROSCOPY:
Radiation Exposure Index (as provided by the fluoroscopic device): 0
minutes 25 seconds. 18.66 micro gray meter squared

PROCEDURE:
The procedure, risks, benefits, and alternatives were explained to
the patient. Questions regarding the procedure were encouraged and
answered. The patient understands and consents to the procedure.

LUMBAR EPIDURAL INJECTION:

An interlaminar approach was performed on left at L5-S1. The
overlying skin was cleansed and anesthetized. A 20 gauge epidural
needle was advanced using loss-of-resistance technique.

DIAGNOSTIC EPIDURAL INJECTION:

Injection of Isovue-M 200 shows a good epidural pattern with spread
above and below the level of needle placement, primarily on the
left. No vascular opacification is seen.

THERAPEUTIC EPIDURAL INJECTION:

Eighty mg of Depo-Medrol mixed with 2 cc 1% lidocaine were
instilled. The procedure was well-tolerated, and the patient was
discharged thirty minutes following the injection in good condition.

COMPLICATIONS:
None
IMPRESSION: Technically successful epidural injection on the left L5-S1. If the
patient does not get satisfactory relief, 1 could consider a
specific left L5 nerve root block, as I think the left lateral
recess at L4-5 is the most likely location of neural compression.

## 2024-03-16 ENCOUNTER — Encounter: Payer: Self-pay | Admitting: Hematology

## 2024-03-22 ENCOUNTER — Telehealth: Payer: Self-pay | Admitting: Dietician

## 2024-03-22 NOTE — Telephone Encounter (Signed)
 Attempt to reach patient for a remote follow up on her weight loss. Called home# same as mobile, reached voice mail. Left message to return call to my cell, also sent text.  Micheline Craven, RDN, LDN Registered Dietitian, [facility] Part Time Remote (Usual office hours: Tuesday-Thursday) Cell: [phone]

## 2024-03-22 NOTE — Assessment & Plan Note (Signed)
-  diagnosed in 12/05/2022, ER-/PR-, g2 -s/p left lumpectomy by Dr. Aron on January 01, 2023.  Surgical path showed intermediate grade DCIS with necrosis, margins were negative. -She has completed adjuvant radiation

## 2024-03-22 NOTE — Assessment & Plan Note (Signed)
 IDC and DCIS, Stage IA, pT1b, cN0, triple negative, Grade 3, chest wall recurrence in 10/2023  -found on screening mammogram. S/p lumpectomy on 01/08/22 with Dr. Aron, path showed: 8 mm invasive and in situ ductal carcinoma with negative margins. Repeat prognostic panel confirmed triple negative disease.  -lymph node biopsies 01/28/22 showed negative nodes (0/2). Port placed at time of procedure. Postoperative course complicated by pneumothorax, which has resolved. -she began adjuvant TC on 02/20/22, and completed planned 4 cycles on 04/24/2022. -she completed adjuvant RT on 07/09/2022  -biopsy confirmed chest lymph nodes (axilla and chest wall including posteriad pectoralis muscle)  recurrence in 10/2023 - Case reviewed in breast tumor board, plan to start neoadjuvant chemotherapy with carboplatin  and paclitaxel  every 3 weeks for 4 cycles, followed by AC every 3 weeks for 4 cycles, along with immunotherapy Keytruda  for 1 year.  If she has good response to treatment, Dr. Aron will consider surgery although she is not able to remove the aubpectoral lymph node.  She may need adjuvant radiation after surgery.

## 2024-03-23 ENCOUNTER — Encounter: Payer: Self-pay | Admitting: Hematology

## 2024-03-23 ENCOUNTER — Telehealth: Payer: Self-pay

## 2024-03-23 ENCOUNTER — Inpatient Hospital Stay: Attending: Hematology | Admitting: Dietician

## 2024-03-23 ENCOUNTER — Inpatient Hospital Stay: Admitting: Hematology

## 2024-03-23 ENCOUNTER — Inpatient Hospital Stay: Attending: Hematology

## 2024-03-23 ENCOUNTER — Inpatient Hospital Stay

## 2024-03-23 ENCOUNTER — Inpatient Hospital Stay: Attending: Hematology | Admitting: Hematology

## 2024-03-23 ENCOUNTER — Other Ambulatory Visit (HOSPITAL_COMMUNITY): Payer: Self-pay

## 2024-03-23 ENCOUNTER — Telehealth: Payer: Self-pay | Admitting: Pharmacist

## 2024-03-23 DIAGNOSIS — Z1732 Human epidermal growth factor receptor 2 negative status: Secondary | ICD-10-CM | POA: Diagnosis not present

## 2024-03-23 DIAGNOSIS — R432 Parageusia: Secondary | ICD-10-CM | POA: Diagnosis not present

## 2024-03-23 DIAGNOSIS — Z888 Allergy status to other drugs, medicaments and biological substances status: Secondary | ICD-10-CM | POA: Insufficient documentation

## 2024-03-23 DIAGNOSIS — Z882 Allergy status to sulfonamides status: Secondary | ICD-10-CM | POA: Diagnosis not present

## 2024-03-23 DIAGNOSIS — Z1722 Progesterone receptor negative status: Secondary | ICD-10-CM | POA: Insufficient documentation

## 2024-03-23 DIAGNOSIS — Z9089 Acquired absence of other organs: Secondary | ICD-10-CM | POA: Insufficient documentation

## 2024-03-23 DIAGNOSIS — D0512 Intraductal carcinoma in situ of left breast: Secondary | ICD-10-CM

## 2024-03-23 DIAGNOSIS — R634 Abnormal weight loss: Secondary | ICD-10-CM | POA: Diagnosis not present

## 2024-03-23 DIAGNOSIS — I2510 Atherosclerotic heart disease of native coronary artery without angina pectoris: Secondary | ICD-10-CM | POA: Insufficient documentation

## 2024-03-23 DIAGNOSIS — Z171 Estrogen receptor negative status [ER-]: Secondary | ICD-10-CM | POA: Insufficient documentation

## 2024-03-23 DIAGNOSIS — K146 Glossodynia: Secondary | ICD-10-CM | POA: Diagnosis not present

## 2024-03-23 DIAGNOSIS — R911 Solitary pulmonary nodule: Secondary | ICD-10-CM | POA: Diagnosis not present

## 2024-03-23 DIAGNOSIS — C50411 Malignant neoplasm of upper-outer quadrant of right female breast: Secondary | ICD-10-CM

## 2024-03-23 DIAGNOSIS — N6331 Unspecified lump in axillary tail of the right breast: Secondary | ICD-10-CM | POA: Insufficient documentation

## 2024-03-23 DIAGNOSIS — T451X5A Adverse effect of antineoplastic and immunosuppressive drugs, initial encounter: Secondary | ICD-10-CM | POA: Insufficient documentation

## 2024-03-23 DIAGNOSIS — B370 Candidal stomatitis: Secondary | ICD-10-CM | POA: Insufficient documentation

## 2024-03-23 DIAGNOSIS — Z79899 Other long term (current) drug therapy: Secondary | ICD-10-CM | POA: Diagnosis not present

## 2024-03-23 DIAGNOSIS — J189 Pneumonia, unspecified organism: Secondary | ICD-10-CM | POA: Diagnosis not present

## 2024-03-23 DIAGNOSIS — Z881 Allergy status to other antibiotic agents status: Secondary | ICD-10-CM | POA: Diagnosis not present

## 2024-03-23 DIAGNOSIS — G620 Drug-induced polyneuropathy: Secondary | ICD-10-CM | POA: Diagnosis not present

## 2024-03-23 LAB — CBC WITH DIFFERENTIAL (CANCER CENTER ONLY)
Abs Immature Granulocytes: 0.01 K/uL (ref 0.00–0.07)
Basophils Absolute: 0.1 K/uL (ref 0.0–0.1)
Basophils Relative: 2 %
Eosinophils Absolute: 0.4 K/uL (ref 0.0–0.5)
Eosinophils Relative: 8 %
HCT: 34 % — ABNORMAL LOW (ref 36.0–46.0)
Hemoglobin: 11.1 g/dL — ABNORMAL LOW (ref 12.0–15.0)
Immature Granulocytes: 0 %
Lymphocytes Relative: 16 %
Lymphs Abs: 0.8 K/uL (ref 0.7–4.0)
MCH: 34.8 pg — ABNORMAL HIGH (ref 26.0–34.0)
MCHC: 32.6 g/dL (ref 30.0–36.0)
MCV: 106.6 fL — ABNORMAL HIGH (ref 80.0–100.0)
Monocytes Absolute: 0.4 K/uL (ref 0.1–1.0)
Monocytes Relative: 8 %
Neutro Abs: 3 K/uL (ref 1.7–7.7)
Neutrophils Relative %: 66 %
Platelet Count: 209 K/uL (ref 150–400)
RBC: 3.19 MIL/uL — ABNORMAL LOW (ref 3.87–5.11)
RDW: 14.1 % (ref 11.5–15.5)
WBC Count: 4.6 K/uL (ref 4.0–10.5)
nRBC: 0 % (ref 0.0–0.2)

## 2024-03-23 LAB — CMP (CANCER CENTER ONLY)
ALT: 171 U/L — ABNORMAL HIGH (ref 0–44)
AST: 62 U/L — ABNORMAL HIGH (ref 15–41)
Albumin: 4.3 g/dL (ref 3.5–5.0)
Alkaline Phosphatase: 552 U/L — ABNORMAL HIGH (ref 38–126)
Anion gap: 10 (ref 5–15)
BUN: 8 mg/dL (ref 8–23)
CO2: 27 mmol/L (ref 22–32)
Calcium: 9.6 mg/dL (ref 8.9–10.3)
Chloride: 102 mmol/L (ref 98–111)
Creatinine: 0.61 mg/dL (ref 0.44–1.00)
GFR, Estimated: >60 mL/min (ref >60)
Glucose, Bld: 132 mg/dL — ABNORMAL HIGH (ref 70–99)
Potassium: 3.8 mmol/L (ref 3.5–5.1)
Sodium: 140 mmol/L (ref 135–145)
Total Bilirubin: 0.6 mg/dL (ref 0.0–1.2)
Total Protein: 7.5 g/dL (ref 6.5–8.1)

## 2024-03-23 MED ORDER — CAPECITABINE 500 MG PO TABS: 1000.0000 mg/m2 | ORAL_TABLET | Freq: Two times a day (BID) | ORAL | 1 refills | Status: DC

## 2024-03-23 MED ORDER — CAPECITABINE 500 MG PO TABS: 1000.0000 mg/m2 | ORAL_TABLET | Freq: Two times a day (BID) | ORAL | 0 refills | Status: DC

## 2024-03-23 MED ORDER — FLUCONAZOLE 100 MG PO TABS: 200.0000 mg | ORAL_TABLET | Freq: Every day | ORAL | 0 refills | Status: AC

## 2024-03-23 NOTE — Progress Notes (Signed)
 Verbal order w/readback from Dr Lanny for CT CAP w/contrast to be done in 6-8wks.  Order placed.

## 2024-03-23 NOTE — Telephone Encounter (Addendum)
 Oral Oncology Patient Advocate Encounter   Received notification that prior authorization for capecitabine is required.   PA submitted on 03/23/2024 Key [id number] Status is pending  Patient must  fill prescription through [facility]      Charlott Hamilton,  CPhT-Adv  she/her/hers [place]  [facility] Pharmacy Technician Patient Advocate Specialist III [facility] Phone: [phone]  Fax: [phone] [email or web address]

## 2024-03-23 NOTE — Progress Notes (Signed)
 [facility]   Telephone:(336) [phone] Fax:([phone]) [phone]   Clinic Follow up Note   Patient Care Team: [name], MD as PCP - General (Internal Medicine) [name], RN as Oncology Nurse Navigator [name], MD as Consulting Physician (General Surgery) [name], MD as Consulting Physician (Hematology) [name], MD as Consulting Physician (Radiation Oncology) [name], MD as Consulting Physician (Endocrinology) [name], MD as Consulting Physician (Sports Medicine) [name], MD as Consulting Physician (Obstetrics and Gynecology) [name], [name], NP as Nurse Practitioner (Nurse Practitioner) Imaging, [facility] as Radiologist (Diagnostic Radiology) Kermit Lear Danas, DO as Referring Physician (Endocrinology)  Date of Service:  03/23/2024  CHIEF COMPLAINT: f/u of recurrent breast cancer  CURRENT THERAPY:  Pending  Oncology History   Malignant neoplasm of upper-outer quadrant of right breast in female, estrogen receptor negative (HCC) IDC and DCIS, Stage IA, pT1b, cN0, triple negative, Grade 3, chest wall recurrence in 10/2023  -found on screening mammogram. S/p lumpectomy on 01/08/22 with Dr. Aron, path showed: 8 mm invasive and in situ ductal carcinoma with negative margins. Repeat prognostic panel confirmed triple negative disease.  -lymph node biopsies 01/28/22 showed negative nodes (0/2). Port placed at time of procedure. Postoperative course complicated by pneumothorax, which has resolved. -she began adjuvant TC on 02/20/22, and completed planned 4 cycles on 04/24/2022. -she completed adjuvant RT on 07/09/2022  -biopsy confirmed chest lymph nodes (axilla and chest wall including posteriad pectoralis muscle)  recurrence in 10/2023 - Case reviewed in breast tumor board, plan to start neoadjuvant chemotherapy with carboplatin  and paclitaxel  every 3 weeks for 4 cycles, followed by AC every 3 weeks for 4 cycles,  along with immunotherapy Keytruda  for 1 year.  If she has good response to treatment, Dr. Aron will consider surgery although she is not able to remove the aubpectoral lymph node.  She may need adjuvant radiation after surgery.  Ductal carcinoma in situ (DCIS) of left breast -diagnosed in 12/05/2022, ER-/PR-, g2 -s/p left lumpectomy by Dr. Aron on January 01, 2023.  Surgical path showed intermediate grade DCIS with necrosis, margins were negative. -She has completed adjuvant radiation  Assessment & Plan Recurrent right breast cancer, triple negative She has recurrent triple negative right breast cancer 2 chest wall lymph nodes, status post chemotherapy carboplatin , paclitaxel , and 1 cycle of AC, along with immunotherapy Keytruda .   - Restaging PET scan showed mixed response to chemotherapy and immunotherapy. Imaging shows resolution of two previously positive lymph nodes but persistent/increased metabolic activity in a third retropectoral node. -Current regimen has caused significant toxicity and suboptimal response. Surgical intervention is complicated by the residual node's location, indeterminate pulmonary nodule, and possible low-grade pneumonitis. Transition to oral capecitabine is anticipated to improve tolerability. Further management will depend on response to oral therapy and additional imaging. Awaiting [facility] genomic testing to guide future therapy. - Discontinued adriamycin /cyclophosphamide  due to toxicity and suboptimal response. - Initiated oral capecitabine (Xeloda), 3 tablets twice daily, 7 days on/7 days off, starting April 11, 2024. - Arranged baseline laboratory evaluation on day of Xeloda initiation. - Coordinated oral pharmacist education and insurance approval for Xeloda. - Scheduled follow-up with nurse practitioner after first cycle and provider after second cycle. - Planned repeat CT chest scan in 7-8 weeks and PET scan at a later date to assess response. - To  discuss surgical and radiation options with surgical and radiation oncology pending further imaging and lung evaluation. - To follow up on [facility] genomic testing  results.  Oral candidiasis Recurrent oral candidiasis with oral discomfort, glossodynia, and dysgeusia, likely secondary to chemotherapy-induced immunosuppression. Topical therapy has provided incomplete relief. - Prescribed oral fluconazole  for 2 weeks (2 tablets daily), with instructions to discontinue if symptoms resolve in 7-10 days. - Advised swallowing oral rinse is acceptable if needed. - Planned repeat liver function tests at next visit to monitor for fluconazole  hepatotoxicity.  Chemotherapy-induced peripheral neuropathy Chronic chemotherapy-induced peripheral neuropathy affecting the feet, without significant involvement of the hands.  Unintentional weight loss secondary to cancer and chemotherapy Ongoing unintentional weight loss due to poor oral intake from oral candidiasis, dysgeusia, and food aversion, compounded by chemotherapy effects. - Encouraged high-calorie, high-protein foods and drinks (e.g., Ensure, Boost, milkshakes, homemade smoothies). - Suggested liquid nutrition as an alternative to solid foods.  Indeterminate pulmonary nodule Left lower lobe pulmonary nodule with mild PET uptake; etiology indeterminate (scar, inflammation, or malignancy).  She also has ground glass change in the right lower lobe, concerning for immunotherapy induced pneumonitis, she is asymptomatic.   -Plan to stop immunotherapy. - Ordered repeat CT scan in 7-8 weeks to monitor nodule. - If nodule persists or increases in size, plan for bronchoscopy with biopsy.  Low-grade pneumonitis, likely treatment-related Imaging reveals ground-glass opacities in the right lung, likely low-grade pneumonitis secondary to immunotherapy or chemotherapy. She reports some discomfort in her throat when eating due to a scratchy sensation similar to  sandpaper. - Reassess with repeat CT scan in 7-8 weeks.  Plan - I personally reviewed her restaging PET scan images, and discussed the findings with the patient and her husband. - To her very poor tolerance to the last cycle chemo AC, and possible mild pneumonitis, I plan to stop her current chemotherapy and immunotherapy. - Change treatment to oral chemo capecitabine 1500 mg twice daily, for 7 days on and 7 days off, starting after Christmas - Follow-up after first cycle Xeloda - Plan to repeat CT chest in about 6 weeks   SUMMARY OF ONCOLOGIC HISTORY: Oncology History Overview Note   Cancer Staging  Malignant neoplasm of upper-outer quadrant of right breast in female, estrogen receptor negative Staging form: Breast, AJCC 8th Edition - Clinical stage from 12/23/2021: cT1b, cM0, G3, ER-, PR-, HER2- - Unsigned Stage prefix: Initial diagnosis Histologic grading system: 3 grade system - Pathologic stage from 01/08/2022: Stage IB (pT1b, pN0, cM0, G3, ER-, PR-, HER2-) - Signed by [name], MD on 01/24/2022 Stage prefix: Initial diagnosis Histologic grading system: 3 grade system Residual tumor (R): R0 - None     Malignant neoplasm of upper-outer quadrant of right breast in female, estrogen receptor negative (HCC)  12/06/2021 Mammogram   CLINICAL DATA:  Patient returns today to evaluate RIGHT breast calcifications identified on a recent screening mammogram.   EXAM: DIGITAL DIAGNOSTIC UNILATERAL RIGHT MAMMOGRAM  IMPRESSION: Grouped coarse heterogeneous calcifications within the upper-outer quadrant of the RIGHT breast, measuring 6 mm extent. These may be fibroadenomatous calcifications. Stereotactic biopsy is recommended to exclude malignancy.   12/18/2021 Initial Biopsy   Diagnosis Breast, right, needle core biopsy, upper outer quadrant, x clip - DUCTAL CARCINOMA IN SITU, SOLID AND CRIBRIFORM TYPE WITH COMEDONECROSIS AND ASSOCIATED CALCIFICATIONS, NUCLEAR GRADE 3 OF 3 - FOCAL  MICROINVASION IS PRESENT (LESS THAN 1 MM) WITH EVIDENCE OF LYMPHOVASCULAR INVASION - NECROSIS: PRESENT - CALCIFICATIONS: PRESENT - DCIS LENGTH: 7 MM IN GREATEST LINEAR DIMENSION ON FRAGMENTED CORES  PROGNOSTIC INDICATORS Results: IMMUNOHISTOCHEMICAL AND MORPHOMETRIC ANALYSIS PERFORMED MANUALLY The tumor cells are NEGATIVE for Her2 (1+). Estrogen Receptor:  0%, NEGATIVE Progesterone Receptor: 0%, NEGATIVE Proliferation Marker Ki67: 60%   12/23/2021 Initial Diagnosis   Malignant neoplasm of upper-outer quadrant of right breast in female, estrogen receptor negative (HCC)   12/23/2021 Imaging   EXAM: ULTRASOUND OF THE RIGHT AXILLA  IMPRESSION: No abnormal appearing RIGHT axillary lymph nodes.   01/08/2022 Cancer Staging   Staging form: Breast, AJCC 8th Edition - Pathologic stage from 01/08/2022: Stage IB (pT1b, pN0, cM0, G3, ER-, PR-, HER2-) - Signed by [name], MD on 01/24/2022 Stage prefix: Initial diagnosis Histologic grading system: 3 grade system Residual tumor (R): R0 - None   02/20/2022 - 04/24/2022 Chemotherapy   Patient is on Treatment Plan : BREAST TC q21d     10/06/2022 Survivorship   SCP delivered by Lacie [name], NP   11/13/2023 -  Chemotherapy   Patient is on Treatment Plan : BREAST Pembrolizumab  (200) D1 + Carboplatin  (5) D1 + Paclitaxel  (80) D1,8,15 q21d X 4 cycles / Pembrolizumab  (200) D1 + AC D1 q21d x 4 cycles     Ductal carcinoma in situ (DCIS) of left breast  12/09/2022 Initial Diagnosis   Ductal carcinoma in situ (DCIS) of left breast   01/01/2023 Surgery   Let breast seed localized lumpectomy.with Dr. Aron  2.5 cm DCIS with negative margins.     02/11/2023 - 03/10/2023 Radiation Therapy   Dose per fraction - 2.66 Gy Prescribed dose (delivered/prescribed)  42.56/42.56 Prescribed Fxs (delivered/prescribed)  16/16  Boost  Dose per fraction -  2Gy Prescribed dose (delivered/prescribed) 8Gy/8Gy) Prescribed Fxs (delivered/prescribed) (4Gy/4Gy)         Discussed the use of AI scribe software for clinical note transcription with the patient, who gave verbal consent to proceed.  History of Present Illness Samantha [name] is a 63 year old female with recurrent right retropectoral ER-negative breast cancer who presents for follow-up after mixed response to chemotherapy.  She completed adriamycin /cyclophosphamide  and immunotherapy at the end of October and is recovering slowly. Recent PET/CT showed two previously positive lymph nodes are no longer metabolically active, while one right retropectoral node remains metabolically active. A left lower lobe pulmonary nodule with mild PET uptake and right lung ground-glass changes are present. She is asymptomatic from a pulmonary standpoint.  For at least two weeks she has had persistent oral candidiasis with burning tongue and scratchy throat, worse with swallowing. She has dysgeusia and marked aversion to food textures. Topical therapy has given incomplete relief, and thrush has recurred throughout treatment.  She has poor oral intake because of mouth discomfort. Her weight decreased from 150 lbs in October to 137 lbs. She finds nutritional supplements like Boost unpalatable after repeated use, has started drinking milkshakes, and is considering smoothies to increase calories.  Her energy and sleep are adequate. She has persistent constant numbness and tingling in her feet without progression and no significant neuropathic symptoms in her hands aside from nail changes.     All other systems were reviewed with the patient and are negative.  MEDICAL HISTORY:  Past Medical History:  Diagnosis Date   Allergy    Anxiety 2015   Result of culmination of super stressful caregiving and deaths of 2 immediate family members.  Overall do pretty well.   Cancer [facility]) 2023   right breast   Coronary artery calcification    Depression    denies   GERD (gastroesophageal reflux disease)    Hyperlipidemia     Hypothyroidism    Right carpal tunnel syndrome 09/19/2019   Thyroid  disease  SURGICAL HISTORY: Past Surgical History:  Procedure Laterality Date   BREAST BIOPSY Left 12/04/2022   MM LT BREAST BX W LOC DEV 1ST LESION IMAGE BX SPEC STEREO GUIDE 12/04/2022 [facility]   BREAST BIOPSY  12/30/2022   MM LT RADIOACTIVE SEED LOC MAMMO GUIDE 12/30/2022 [facility]   BREAST LUMPECTOMY WITH RADIOACTIVE SEED LOCALIZATION Right 01/08/2022   Procedure: RIGHT BREAST LUMPECTOMY WITH RADIOACTIVE SEED LOCALIZATION;  Surgeon: [name], MD;  Location: [facility] OR;  Service: General;  Laterality: Right;   BREAST LUMPECTOMY WITH RADIOACTIVE SEED LOCALIZATION Left 01/01/2023   Procedure: LEFT BREAST SEED LOCALIZED LUMPECTOMY;  Surgeon: [name], MD;  Location: [facility] OR;  Service: General;  Laterality: Left;   PORT-A-CATH REMOVAL Left 05/20/2022   Procedure: REMOVAL PORT-A-CATH;  Surgeon: [name], MD;  Location: [facility];  Service: General;  Laterality: Left;   PORTACATH PLACEMENT Left 01/28/2022   Procedure: INSERTION PORT-A-CATH;  Surgeon: [name], MD;  Location: [facility];  Service: General;  Laterality: Left;   PORTACATH PLACEMENT Left 11/04/2023   Procedure: INSERTION, TUNNELED CENTRAL VENOUS DEVICE, WITH PORT;  Surgeon: [name], MD;  Location: [facility];  Service: General;  Laterality: Left;  PORT PLACEMENT WITH ULTRASOUND GUIDANCE   SENTINEL NODE BIOPSY Right 01/28/2022   Procedure: RIGHT SENTINEL LYMPH NODE BIOPSY;  Surgeon: [name], MD;  Location: [facility];  Service: General;  Laterality: Right;   TONSILLECTOMY      I have reviewed the social history and family history with the patient and they are unchanged from previous note.  ALLERGIES:  is allergic to nortriptyline , singulair  [montelukast  sodium], sulfonamide derivatives, amitriptyline  hcl, esomeprazole, fluticasone , gabapentin , lexapro  [escitalopram], neomycin, paclitaxel , pseudoephedrine, and neosporin [bacitracin-polymyxin b].  MEDICATIONS:  Current Outpatient Medications  Medication Sig Dispense Refill   acetaminophen  (TYLENOL ) 325 MG tablet Take 650 mg by mouth every 6 (six) hours as needed.     ALPRAZolam  (XANAX ) 0.5 MG tablet Take 1 tablet (0.5 mg total) by mouth daily as needed for anxiety. 30 tablet 1   atorvastatin (LIPITOR) 20 MG tablet Take 20 mg by mouth every evening.     betamethasone dipropionate 0.05 % cream Apply 1 Application topically 2 (two) times daily as needed (skin irritation.).     Calcium  Carb-Cholecalciferol (CALCIUM -VITAMIN D3) 250-125 MG-UNIT TABS Take 1 tablet by mouth daily with lunch.     Cholecalciferol (VITAMIN D3) 50 MCG (2000 UT) TABS Take 2,000 Units by mouth daily with lunch.     Clobetasol  Prop Emollient Base 0.05 % emollient cream Apply 1 Application topically 2 (two) times daily. 60 g 1   ezetimibe (ZETIA) 10 MG tablet Take 10 mg by mouth every evening.     famotidine  (PEPCID ) 20 MG tablet Take 1 tablet (20 mg total) by mouth daily. (Patient taking differently: Take 20 mg by mouth daily as needed for heartburn or indigestion.)     fluconazole  (DIFLUCAN ) 100 MG tablet Take 2 tablets (200 mg total) by mouth daily. 28 tablet 0   magic mouthwash (nystatin , diphenhydrAMINE , alum & mag hydroxide) suspension mixture Swish and spit 5 mLs 4 (four) times daily as needed for mouth pain. 140 mL 1   nystatin  (MYCOSTATIN ) 100000 UNIT/ML suspension Take 5 mLs (500,000 Units total) by mouth 4 (four) times daily. 473 mL 0   nystatin -triamcinolone (MYCOLOG II) cream Apply 1 Application topically 2 (two) times daily as needed (irritation).     ondansetron  (ZOFRAN ) 8 MG tablet Take 1 tablet (  8 mg total) by mouth every 8 (eight) hours as needed for nausea or vomiting. Start on the third day after chemotherapy. 30 tablet 1   pantoprazole  (PROTONIX ) 40 MG tablet TAKE 1 TABLET BY MOUTH 2 TIMES A DAY 60 tablet 2    prochlorperazine  (COMPAZINE ) 10 MG tablet Take 1 tablet (10 mg total) by mouth every 6 (six) hours as needed for nausea or vomiting. 30 tablet 1   sertraline  (ZOLOFT ) 50 MG tablet Take 50 mg by mouth in the morning.     tacrolimus (PROTOPIC) 0.03 % ointment Apply 1 Application topically 2 (two) times daily as needed (skin irritation.).     TIROSINT  100 MCG CAPS Take 100 mcg by mouth See admin instructions. Take 1 capsule (100 mcg) by mouth every 3 days, alternating with (Tirosint  88 mcg)--take on an empty stomach.     TIROSINT  88 MCG CAPS Take 88 mcg by mouth See admin instructions. Take 1 capsule (88 mcg) by mouth 2 days in a row, alternating with Tirosint  (100 mcg) on the 3 rd day.     triamcinolone ointment (KENALOG) 0.1 % Apply 1 Application topically 2 (two) times daily as needed (skin irritation.).     valACYclovir (VALTREX) 500 MG tablet Take 500 mg by mouth 2 (two) times daily as needed (cold sore/fever blisters).     capecitabine (XELODA) 500 MG tablet Take 3 tablets (1,500 mg total) by mouth 2 (two) times daily after a meal. Take 30 mins after meal, every 10-12 hours, for 7 days then off for 7 days  Starting on 04/11/2024 42 tablet 1   Fezolinetant (VEOZAH) 45 MG TABS Take 45 mg by mouth in the morning. (Patient not taking: Reported on 11/26/2023)     HYDROcodone -acetaminophen  (NORCO/VICODIN) 5-325 MG tablet Take 1 tablet by mouth every 6 (six) hours as needed for moderate pain (pain score 4-6). 15 tablet 0   lidocaine -prilocaine  (EMLA ) cream Apply to affected area once (Patient not taking: Reported on 03/23/2024) 30 g 3   loperamide (IMODIUM) 2 MG capsule Take by mouth as needed for diarrhea or loose stools.     No current facility-administered medications for this visit.    PHYSICAL EXAMINATION: ECOG PERFORMANCE STATUS: 2 - Symptomatic, <50% confined to bed  Vitals:   03/23/24 1107  BP: 107/63  Pulse: 90  Resp: 17  Temp: (!) 94.8 F (34.9 C)  SpO2: 96%   Wt Readings from  Last 3 Encounters:  03/23/24 137 lb 8 oz (62.4 kg)  03/02/24 139 lb 8 oz (63.3 kg)  02/10/24 150 lb 8 oz (68.3 kg)     GENERAL:alert, no distress and comfortable SKIN: skin color, texture, turgor are normal, no rashes or significant lesions EYES: normal, Conjunctiva are pink and non-injected, sclera clear HEENT: (+) thrush on the tongue  NECK: supple, thyroid  normal size, non-tender, without nodularity LYMPH:  no palpable lymphadenopathy in the cervical, axillary  LUNGS: clear to auscultation and percussion with normal breathing effort HEART: regular rate & rhythm and no murmurs and no lower extremity edema ABDOMEN:abdomen soft, non-tender and normal bowel sounds Musculoskeletal:no cyanosis of digits and no clubbing  NEURO: alert & oriented x 3 with fluent speech, no focal motor/sensory deficits  Physical Exam   LABORATORY DATA:  I have reviewed the data as listed    Latest Ref Rng & Units 03/23/2024   10:36 AM 03/02/2024   11:16 AM 02/10/2024   12:36 PM  CBC  WBC 4.0 - 10.5 K/uL 4.6  5.3  3.1  Hemoglobin 12.0 - 15.0 g/dL 88.8  89.9  89.7   Hematocrit 36.0 - 46.0 % 34.0  30.2  30.4   Platelets 150 - 400 K/uL 209  274  168         Latest Ref Rng & Units 03/23/2024   10:36 AM 03/02/2024   11:16 AM 02/10/2024   12:36 PM  CMP  Glucose 70 - 99 mg/dL 867  862  892   BUN 8 - 23 mg/dL 8  10  12    Creatinine 0.44 - 1.00 mg/dL 9.38  9.50  9.50   Sodium 135 - 145 mmol/L 140  140  140   Potassium 3.5 - 5.1 mmol/L 3.8  3.5  3.7   Chloride 98 - 111 mmol/L 102  100  104   CO2 22 - 32 mmol/L 27  28  30    Calcium  8.9 - 10.3 mg/dL 9.6  9.7  9.3   Total Protein 6.5 - 8.1 g/dL 7.5  7.4  7.1   Total Bilirubin 0.0 - 1.2 mg/dL 0.6  0.4  0.5   Alkaline Phos 38 - 126 U/L 552  860  161   AST 15 - 41 U/L 62  225  43   ALT 0 - 44 U/L 171  308  59       RADIOGRAPHIC STUDIES: I have personally reviewed the radiological images as listed and agreed with the findings in the report. No  results found.    Orders Placed This Encounter  Procedures   CT Chest Wo Contrast    Standing Status:   Future    Expected Date:   05/04/2024    Expiration Date:   03/23/2025    Preferred imaging location?:   [facility]   All questions were answered. The patient knows to call the clinic with any problems, questions or concerns. No barriers to learning was detected. The total time spent in the appointment was 40 minutes, including review of chart and various tests results, discussions about plan of care and coordination of care plan     Onita Mattock, MD 03/23/2024

## 2024-03-23 NOTE — Progress Notes (Signed)
 Nutrition Follow-up:   Pt with recurrence of breast cancer in left chest wall, triple negative. She will be transitioning to Xeloda after Christmas. Patient is under the care of Dr. Lanny.    Spoke with patient via telephone for nutrition follow-up. She texted picture of Perfect Amino Acids supplement which advertises 1 gram of pure amino acid per tablet equivalent to 3 grams of meat and encouraged 5-10 tablets per day.  I relayed the equivalent in ounces of meat and egg whites and encouraged real food.    She is struggling again with Thrush infection.  She also endorses texture intolerances with foods lingering in mouth and wanting to spit them out.  Tolerating soft cooked eggs, yogurts, milk.    Medications: reviewed    Labs: 03/23/24  Hgb 11.1   Anthropometrics: Weight loss 22# (14%) past 3 months significant fro timeframe   03/23/24  137.5# 03/02/24  139.5# 02/10/24  150.5# 10/15 - 154 lb 1.6 oz 9/25 - 158 lb 11.2 oz 9/11 - 161 lb 14.4 oz    NUTRITION DIAGNOSIS: Unintended wt loss continues      INTERVENTION:   Encouraged baking soda swishes to cleanse mouth after eating especially with carbs.  Encouraged trial of using large bore straw to increase PO intake with blended foods.  Encouraged increased use of high probiotic yogurts and fermented foods to boost healthy gut bacteria, and soluble fibers to nourish their proliferation.  Continue small frequent feeds with use of fats to lubricate foods to allow them to slide down faster.    Encouraged monitoring weight at home tomorrow morning and in 2 weeks prior to remote follow up to assess trend.   MONITORING, EVALUATION, GOAL: wt trends, intake   Goal 2-4# weight gain per month to DBW   NEXT VISIT: Remote with Elvie 12/31  Micheline Craven, RDN, LDN Registered Dietitian, [facility] Part Time Remote (Usual office hours: Tuesday-Thursday) Mobile: [phone] Remote Office: [phone]

## 2024-03-23 NOTE — Progress Notes (Signed)
 Per Dr Lanny, pt will no longer IV chemo and will start Capecitabine (Xeloda) single agent after Christmas.

## 2024-03-23 NOTE — Telephone Encounter (Signed)
 Oral Oncology Patient Advocate Encounter  Prior Authorization for Capecitabine has been approved.    PA# [id number] Effective dates: 03/23/2024 through 03/23/2025  Patient must fill prescription through [facility]     Charlott Hamilton,  CPhT-Adv  she/her/hers [place]  [facility] Pharmacy Technician Patient Advocate Specialist III [facility] Phone: [phone]  Fax: [phone] [email or web address]

## 2024-03-24 MED ORDER — CAPECITABINE 500 MG PO TABS: 1000.0000 mg/m2 | ORAL_TABLET | Freq: Two times a day (BID) | ORAL | 1 refills | Status: DC

## 2024-03-24 NOTE — Telephone Encounter (Signed)
 Oral Oncology Pharmacist Encounter  Received new prescription for Xeloda (capecitabine) for the treatment of recurrent metastatic triple negative breast cancer, planned duration until disease progression or unacceptable drug toxicity. Planned start date is 04/11/24  CBC w/ Diff and CMP from 03/23/24 assessed, noted patient with elevated LFTs (AST 62 U/L, ALT 171 U/L, T. Bili WNL at 0.6 mg/dL) LFTs have improved from previous labs on 03/02/24. No baseline hepatic dose adjustments are required for Xeloda. Prescription dose and frequency assessed for appropriateness.  Current medication list in Epic reviewed, DDIs with Xeloda identified: Category C DDI between Xeloda and Ondansetron  due to risk of Qtc prolongation with fluorouracil products. Noted patient only taking PRN and PO route, risk higher with IV administration. No change in therapy warranted at this time.  Category C DDI between Xeloda and Pantoprazole  - proton-pump inhibitors can decrease efficacy of Xeloda - will discuss with patient alternatives to pantoprazole , such as H2RA's like famotidine  while on Xeloda. Category C drug-drug interaction between Xeloda and Fluconazole  due to risk of Qtc prolongation. Patient prescribed 14 day course of fluconazole  and will be done with course of therapy prior to starting Xeloda. No changes in therapy warranted at this time.   Evaluated chart and no patient barriers to medication adherence noted.   Patient agreement for treatment documented in MD note on 03/23/24.  Patient's insurance requires that she fill through [facility] - prescription has been redirected for dispensing.   Oral Oncology Clinic will continue to follow for insurance authorization, copayment issues, initial counseling and start date.  Asberry Macintosh, PharmD, BCPS, BCOP Hematology/Oncology Clinical Pharmacist [phone] 03/24/2024 9:14 AM

## 2024-03-25 ENCOUNTER — Other Ambulatory Visit: Payer: Self-pay

## 2024-03-25 ENCOUNTER — Inpatient Hospital Stay

## 2024-03-25 DIAGNOSIS — C50411 Malignant neoplasm of upper-outer quadrant of right female breast: Secondary | ICD-10-CM

## 2024-03-25 MED ORDER — CAPECITABINE 500 MG PO TABS: 1000.0000 mg/m2 | ORAL_TABLET | Freq: Two times a day (BID) | ORAL | 1 refills | Status: DC

## 2024-03-29 ENCOUNTER — Other Ambulatory Visit: Payer: Self-pay | Admitting: Nurse Practitioner

## 2024-03-29 ENCOUNTER — Encounter: Payer: Self-pay | Admitting: Hematology

## 2024-03-29 DIAGNOSIS — C50411 Malignant neoplasm of upper-outer quadrant of right female breast: Secondary | ICD-10-CM

## 2024-03-29 NOTE — Telephone Encounter (Signed)
 Oral Oncology Pharmacist Encounter  Called [facility] [phone]) to check on status of Xeloda  for patient. Spoke with pharmacist and confirmed that updated Rx had been sent in on 03/25/24 that had an updated quantity of #84 tablets for 28 day supply (this will allow patient to get through 2 cycles of Xeloda  without risk of increased delays from pharmacy for shipping). Patient height and weight provided to pharmacist.   Will continue to follow for shipment updates and education.   Asberry Macintosh, PharmD, BCPS, BCOP Hematology/Oncology Clinical Pharmacist [phone] 03/29/2024 8:33 AM

## 2024-03-30 NOTE — Telephone Encounter (Signed)
 Oral Chemotherapy Pharmacist Encounter   Attempted to reach patient to provide update and offer for initial counseling on oral medication: Xeloda  (capecitabine ).   No answer. Left voicemail for patient to call back to discuss details of medication acquisition and initial counseling session. Left phone number for [facility] [phone] for patient to call and set up shipment of Xeloda  (patient has $0 copay).  Asberry Macintosh, PharmD, BCPS, BCOP Hematology/Oncology Clinical Pharmacist [phone] 03/30/2024 11:07 AM

## 2024-03-31 NOTE — Telephone Encounter (Signed)
 Oral Chemotherapy Pharmacist Encounter  I spoke with patient for overview of: Xeloda  (capecitabine ) for the treatment of recurrent metastatic triple negative breast cancer, planned duration until disease progression or unacceptable drug toxicity.   Treatment goal: Palliative  Counseled patient on administration, dosing, side effects, monitoring, drug-food interactions, safe handling, storage, and disposal.  Patient will take Xeloda  500mg  tablets, 3 tablets (1500mg ) by mouth in AM and 3 tabs (1500mg ) by mouth in PM, within 30 minutes of finishing meals, for 7 days on, 7 days off, repeated every 14 days.  Xeloda  start date: 04/11/24  Adverse effects include but are not limited to: fatigue, decreased blood counts, GI upset, diarrhea, mouth sores, and hand-foot syndrome. Hand-foot syndrome: discussed use of cream such as [place] Smooth Extra Care 20 or equivalent advanced care cream that has 20% urea content for advanced skin hydration while on Xeloda . Additionally discussed use of OTC Voltaren  gel for HFS prophylaxis. Discussed recommended use of Voltaren  gel is 1 finger tip application for front/backside of hands and then 1 fingertip application to bottoms of feet twice a day for up to 12 weeks.  Diarrhea: Patient will obtain Imodium (loperamide) to have on hand if they experience diarrhea. Patient knows to alert the office of 4 or more loose stools above baseline.  Reviewed with patient importance of keeping a medication schedule and plan for any missed doses. No barriers to medication adherence identified.  Medication reconciliation performed and medication/allergy list updated. Xeloda  and Pantoprazole  drug-drug interaction: discussed with patient that pantoprazole  can decrease efficacy of Xeloda , thus our goal would be that she is off of her PPI while on therapy. Patient agreeable to stopping pantoprazole  and switch to taking famotidine  BID and Tums PRN. She will alert Dr. Lanny is this does not  help with her GERD.   Patient switches to medicare starting 04/14/24. Ms. Meisinger has my contact info incase her next copay for capecitabine  is unaffordable. We discussed we could always do cash pricing at [facility] if her copay is high.   Distress thermometer flowsheet: Distress thermometer not completed during telephone call as patient has been on previous lines of therapy.   Communication and Learning Assessment Primary learner: Patient Barriers to learning: No barriers Preferred language: English Learning preferences: Listening Reading  All questions answered.  Ms. Bayless voiced understanding and appreciation.   Medication education handout placed in mail for patient. Medication calendar emailed to patient. Patient knows to call the office with questions or concerns. Oral Chemotherapy Clinic phone number provided to patient.   Asberry Macintosh, PharmD, BCPS, BCOP Hematology/Oncology Clinical Pharmacist [phone] 03/31/2024 10:20 AM

## 2024-04-05 ENCOUNTER — Other Ambulatory Visit: Payer: Self-pay | Admitting: *Deleted

## 2024-04-05 ENCOUNTER — Encounter: Payer: Self-pay | Admitting: Nurse Practitioner

## 2024-04-05 ENCOUNTER — Other Ambulatory Visit: Payer: Self-pay | Admitting: Nurse Practitioner

## 2024-04-05 MED ORDER — MIRTAZAPINE 7.5 MG PO TABS: 7.5000 mg | ORAL_TABLET | Freq: Every day | ORAL | 0 refills | Status: DC

## 2024-04-11 ENCOUNTER — Inpatient Hospital Stay

## 2024-04-11 DIAGNOSIS — C50411 Malignant neoplasm of upper-outer quadrant of right female breast: Secondary | ICD-10-CM

## 2024-04-11 DIAGNOSIS — D0512 Intraductal carcinoma in situ of left breast: Secondary | ICD-10-CM

## 2024-04-11 LAB — CBC WITH DIFFERENTIAL (CANCER CENTER ONLY)
Abs Immature Granulocytes: 0.01 K/uL (ref 0.00–0.07)
Basophils Absolute: 0.1 K/uL (ref 0.0–0.1)
Basophils Relative: 1 %
Eosinophils Absolute: 0.2 K/uL (ref 0.0–0.5)
Eosinophils Relative: 4 %
HCT: 35.1 % — ABNORMAL LOW (ref 36.0–46.0)
Hemoglobin: 11.6 g/dL — ABNORMAL LOW (ref 12.0–15.0)
Immature Granulocytes: 0 %
Lymphocytes Relative: 19 %
Lymphs Abs: 0.9 K/uL (ref 0.7–4.0)
MCH: 33.8 pg (ref 26.0–34.0)
MCHC: 33 g/dL (ref 30.0–36.0)
MCV: 102.3 fL — ABNORMAL HIGH (ref 80.0–100.0)
Monocytes Absolute: 0.5 K/uL (ref 0.1–1.0)
Monocytes Relative: 9 %
Neutro Abs: 3.4 K/uL (ref 1.7–7.7)
Neutrophils Relative %: 67 %
Platelet Count: 176 K/uL (ref 150–400)
RBC: 3.43 MIL/uL — ABNORMAL LOW (ref 3.87–5.11)
RDW: 13.2 % (ref 11.5–15.5)
WBC Count: 5 K/uL (ref 4.0–10.5)
nRBC: 0 % (ref 0.0–0.2)

## 2024-04-11 LAB — TSH: TSH: 0.239 u[IU]/mL — ABNORMAL LOW (ref 0.350–4.500)

## 2024-04-11 LAB — T4, FREE: Free T4: 1.48 ng/dL (ref 0.80–2.00)

## 2024-04-12 ENCOUNTER — Encounter: Payer: Self-pay | Admitting: Nurse Practitioner

## 2024-04-13 ENCOUNTER — Inpatient Hospital Stay

## 2024-04-13 ENCOUNTER — Inpatient Hospital Stay: Admitting: Dietician

## 2024-04-13 ENCOUNTER — Telehealth: Payer: Self-pay | Admitting: Dietician

## 2024-04-13 ENCOUNTER — Other Ambulatory Visit (HOSPITAL_COMMUNITY): Payer: Self-pay

## 2024-04-13 ENCOUNTER — Other Ambulatory Visit: Payer: Self-pay

## 2024-04-13 MED ORDER — NYSTATIN 100000 UNIT/ML MT SUSP
5.0000 mL | Freq: Four times a day (QID) | OROMUCOSAL | 1 refills | Status: AC | PRN
  Filled 2024-04-13 – 2024-04-20 (×2): qty 140, 7d supply, fill #0

## 2024-04-13 NOTE — Progress Notes (Signed)
 Refill pt's prescription for Magic Mouthwash per verbal order from Powell Lessen, NP.

## 2024-04-13 NOTE — Telephone Encounter (Signed)
 Nutrition Follow-up:  Pt with recurrence of breast cancer in left chest wall, triple negative. Previously receiving chemoimmunotherapy with carbo/abraxane  + keytruda  q21d (stopped d/t toxicity and suboptimal response). Currently receiving xeloda  (start 12/29). Patient is under the care of Dr. Lanny.   Spoke with patient via phone. Reports doing well overall. Appetite is there. Taste is improving. Patient has completed fluconazole  for thrush. Says tongue is no longer white, however it feels scratchy and is sore especially when chewing. She has had previous success with MMW. Patient currently using baking soda salt water gargles after eating. She hopes symptoms are not indicative of recurrent thrush.   Patient recalls recently eating a lot of potatoes (sweet and white), ham, chicken, and hamburger. Drinking fairlife milk. Got tired of the CIB. Says she likes the peach mango protein pouches that she found at the grocery store. Enjoying chocolate pudding. Ate almost the whole pot.  Drinking a lot of Gatorade (32 oz).   Medications: reviewed   Labs: reviewed   Anthropometrics: Wt 137 lb 8 oz on 12/10 - pt feels she has gained a couple of pounds  11/19 - 139 lb 8 oz 10/20 - 150 lb 8 oz    NUTRITION DIAGNOSIS: Unintended wt loss - suspect stable given pt report   INTERVENTION:  Continue working to increase calorie/protein intake with small frequent meals/snacks Message to NP regarding MMW - continue baking soda salt water gargles  Continue 2 glasses fairlife milk     MONITORING, EVALUATION, GOAL: wt trends, intake   NEXT VISIT: Friday January 30 via telephone

## 2024-04-15 ENCOUNTER — Inpatient Hospital Stay

## 2024-04-15 ENCOUNTER — Other Ambulatory Visit (HOSPITAL_COMMUNITY): Payer: Self-pay

## 2024-04-18 ENCOUNTER — Encounter: Payer: Self-pay | Admitting: Hematology

## 2024-04-19 DIAGNOSIS — Z171 Estrogen receptor negative status [ER-]: Secondary | ICD-10-CM

## 2024-04-19 NOTE — Progress Notes (Unsigned)
 "     [facility]   [phone] Fax:([phone]    Patient Care Team: [name], MD as PCP - General (Internal Medicine) [name], RN as Oncology Nurse Navigator [name], MD as Consulting Physician (General Surgery) [name], MD as Consulting Physician (Hematology) [name], MD as Consulting Physician (Radiation Oncology) [name], MD as Consulting Physician (Endocrinology) [name], MD as Consulting Physician (Sports Medicine) [name], MD as Consulting Physician (Obstetrics and Gynecology) Ann Mayme POUR, NP as Nurse Practitioner (Nurse Practitioner) Imaging, [facility] as Radiologist (Diagnostic Radiology) Kermit Lear Danas, DO as Referring Physician (Endocrinology)   CHIEF COMPLAINT: Follow up recurrent breast cancer  Oncology History Overview Note   Cancer Staging  Malignant neoplasm of upper-outer quadrant of right breast in female, estrogen receptor negative Staging form: Breast, AJCC 8th Edition - Clinical stage from 12/23/2021: cT1b, cM0, G3, ER-, PR-, HER2- - Unsigned Stage prefix: Initial diagnosis Histologic grading system: 3 grade system - Pathologic stage from 01/08/2022: Stage IB (pT1b, pN0, cM0, G3, ER-, PR-, HER2-) - Signed by [name], MD on 01/24/2022 Stage prefix: Initial diagnosis Histologic grading system: 3 grade system Residual tumor (R): R0 - None     Malignant neoplasm of upper-outer quadrant of right breast in female, estrogen receptor negative (HCC)  12/06/2021 Mammogram   CLINICAL DATA:  Patient returns today to evaluate RIGHT breast calcifications identified on a recent screening mammogram.   EXAM: DIGITAL DIAGNOSTIC UNILATERAL RIGHT MAMMOGRAM  IMPRESSION: Grouped coarse heterogeneous calcifications within the upper-outer quadrant of the RIGHT breast, measuring 6 mm extent. These may be fibroadenomatous calcifications. Stereotactic biopsy is  recommended to exclude malignancy.   12/18/2021 Initial Biopsy   Diagnosis Breast, right, needle core biopsy, upper outer quadrant, x clip - DUCTAL CARCINOMA IN SITU, SOLID AND CRIBRIFORM TYPE WITH COMEDONECROSIS AND ASSOCIATED CALCIFICATIONS, NUCLEAR GRADE 3 OF 3 - FOCAL MICROINVASION IS PRESENT (LESS THAN 1 MM) WITH EVIDENCE OF LYMPHOVASCULAR INVASION - NECROSIS: PRESENT - CALCIFICATIONS: PRESENT - DCIS LENGTH: 7 MM IN GREATEST LINEAR DIMENSION ON FRAGMENTED CORES  PROGNOSTIC INDICATORS Results: IMMUNOHISTOCHEMICAL AND MORPHOMETRIC ANALYSIS PERFORMED MANUALLY The tumor cells are NEGATIVE for Her2 (1+). Estrogen Receptor: 0%, NEGATIVE Progesterone Receptor: 0%, NEGATIVE Proliferation Marker Ki67: 60%   12/23/2021 Initial Diagnosis   Malignant neoplasm of upper-outer quadrant of right breast in female, estrogen receptor negative (HCC)   12/23/2021 Imaging   EXAM: ULTRASOUND OF THE RIGHT AXILLA  IMPRESSION: No abnormal appearing RIGHT axillary lymph nodes.   01/08/2022 Cancer Staging   Staging form: Breast, AJCC 8th Edition - Pathologic stage from 01/08/2022: Stage IB (pT1b, pN0, cM0, G3, ER-, PR-, HER2-) - Signed by [name], MD on 01/24/2022 Stage prefix: Initial diagnosis Histologic grading system: 3 grade system Residual tumor (R): R0 - None   02/20/2022 - 04/24/2022 Chemotherapy   Patient is on Treatment Plan : BREAST TC q21d     10/06/2022 Survivorship   SCP delivered by Tayjon Halladay, NP   11/13/2023 - 02/12/2024 Chemotherapy   Patient is on Treatment Plan : BREAST Pembrolizumab  (200) D1 + Carboplatin  (5) D1 + Paclitaxel  (80) D1,8,15 q21d X 4 cycles / Pembrolizumab  (200) D1 + AC D1 q21d x 4 cycles     Ductal carcinoma in situ (DCIS) of left breast  12/09/2022 Initial Diagnosis   Ductal carcinoma in situ (DCIS) of left breast   01/01/2023 Surgery   Let breast seed localized lumpectomy.with Dr. Aron  2.5 cm DCIS with negative  margins.     02/11/2023 - 03/10/2023  Radiation Therapy   Dose per fraction - 2.66 Gy Prescribed dose (delivered/prescribed)  42.56/42.56 Prescribed Fxs (delivered/prescribed)  16/16  Boost  Dose per fraction -  2Gy Prescribed dose (delivered/prescribed) 8Gy/8Gy) Prescribed Fxs (delivered/prescribed) (4Gy/4Gy)        CURRENT THERAPY: Xeloda  1500 mg BID x1 week on/1 week off - starting 04/11/24  INTERVAL HISTORY Ms. Mankin returns for follow up, last seen 03/23/24. She began Xeloda  12/29.   ROS   Past Medical History:  Diagnosis Date   Allergy    Anxiety 2015   Result of culmination of super stressful caregiving and deaths of 2 immediate family members.  Overall do pretty well.   Cancer [facility]) 2023   right breast   Coronary artery calcification    Depression    denies   GERD (gastroesophageal reflux disease)    Hyperlipidemia    Hypothyroidism    Right carpal tunnel syndrome 09/19/2019   Thyroid  disease      Past Surgical History:  Procedure Laterality Date   BREAST BIOPSY Left 12/04/2022   MM LT BREAST BX W LOC DEV 1ST LESION IMAGE BX SPEC STEREO GUIDE 12/04/2022 [facility]   BREAST BIOPSY  12/30/2022   MM LT RADIOACTIVE SEED LOC MAMMO GUIDE 12/30/2022 [facility]   BREAST LUMPECTOMY WITH RADIOACTIVE SEED LOCALIZATION Right 01/08/2022   Procedure: RIGHT BREAST LUMPECTOMY WITH RADIOACTIVE SEED LOCALIZATION;  Surgeon: [name], MD;  [facility];  Service: General;  Laterality: Right;   BREAST LUMPECTOMY WITH RADIOACTIVE SEED LOCALIZATION Left 01/01/2023   Procedure: LEFT BREAST SEED LOCALIZED LUMPECTOMY;  Surgeon: [name], MD;  Location: [facility];  Service: General;  Laterality: Left;   PORT-A-CATH REMOVAL Left 05/20/2022   Procedure: REMOVAL PORT-A-CATH;  Surgeon: [name], MD;  Location: [facility];  Service: General;  Laterality: Left;   PORTACATH PLACEMENT Left 01/28/2022   Procedure: INSERTION PORT-A-CATH;  Surgeon: [name], MD;  Location: [facility];  Service: General;  Laterality: Left;   PORTACATH PLACEMENT Left 11/04/2023   Procedure: INSERTION, TUNNELED CENTRAL VENOUS DEVICE, WITH PORT;  Surgeon: [name], MD;  Location: [facility];  Service: General;  Laterality: Left;  PORT PLACEMENT WITH ULTRASOUND GUIDANCE   SENTINEL NODE BIOPSY Right 01/28/2022   Procedure: RIGHT SENTINEL LYMPH NODE BIOPSY;  Surgeon: [name], MD;  Location: [facility];  Service: General;  Laterality: Right;   TONSILLECTOMY       Outpatient Encounter Medications as of 04/20/2024  Medication Sig Note   acetaminophen  (TYLENOL ) 325 MG tablet Take 650 mg by mouth every 6 (six) hours as needed.    ALPRAZolam  (XANAX ) 0.5 MG tablet Take 1 tablet (0.5 mg total) by mouth daily as needed for anxiety.    atorvastatin (LIPITOR) 20 MG tablet Take 20 mg by mouth every evening.    betamethasone dipropionate 0.05 % cream Apply 1 Application topically 2 (two) times daily as needed (skin irritation.).    Calcium  Carb-Cholecalciferol (CALCIUM -VITAMIN D3) 250-125 MG-UNIT TABS Take 1 tablet by mouth daily with lunch.    capecitabine  (XELODA ) 500 MG tablet Take 3 tablets (1,500 mg total) by mouth 2 (two) times daily after a meal. Take within 30 minutes after meal, every 10-12 hours. Take for 7 days on, then off for 7 days. Repeat every 14 days.    Cholecalciferol (VITAMIN D3) 50 MCG (2000 UT) TABS Take 2,000 Units by mouth daily with lunch.  Clobetasol  Prop Emollient Base 0.05 % emollient cream Apply 1 Application topically 2 (two) times daily.    ezetimibe (ZETIA) 10 MG tablet Take 10 mg by mouth every evening.    famotidine  (PEPCID ) 20 MG tablet Take 1 tablet (20 mg total) by mouth daily. (Patient taking differently: Take 20 mg by mouth daily as needed for heartburn or indigestion.)    Fezolinetant (VEOZAH) 45 MG TABS Take 45 mg by mouth in the morning. (Patient not taking: Reported on 11/26/2023)    fluconazole  (DIFLUCAN ) 100 MG tablet  Take 2 tablets (200 mg total) by mouth daily.    HYDROcodone -acetaminophen  (NORCO/VICODIN) 5-325 MG tablet Take 1 tablet by mouth every 6 (six) hours as needed for moderate pain (pain score 4-6).    lidocaine -prilocaine  (EMLA ) cream Apply to affected area once (Patient not taking: Reported on 03/23/2024) 11/02/2023: Newly prescribed---patient to start with chemo   loperamide (IMODIUM) 2 MG capsule Take by mouth as needed for diarrhea or loose stools.    magic mouthwash (nystatin , diphenhydrAMINE , alum & mag hydroxide) suspension mixture Swish and spit 5 mLs 4 (four) times daily as needed for mouth pain.    mirtazapine  (REMERON ) 7.5 MG tablet Take 1 tablet (7.5 mg total) by mouth at bedtime.    nystatin  (MYCOSTATIN ) 100000 UNIT/ML suspension Take 5 mLs (500,000 Units total) by mouth 4 (four) times daily.    nystatin -triamcinolone (MYCOLOG II) cream Apply 1 Application topically 2 (two) times daily as needed (irritation).    ondansetron  (ZOFRAN ) 8 MG tablet Take 1 tablet (8 mg total) by mouth every 8 (eight) hours as needed for nausea or vomiting. Start on the third day after chemotherapy. 11/02/2023: Newly prescribed---patient to start with chemo    pantoprazole  (PROTONIX ) 40 MG tablet TAKE 1 TABLET BY MOUTH 2 TIMES A DAY    prochlorperazine  (COMPAZINE ) 10 MG tablet Take 1 tablet (10 mg total) by mouth every 6 (six) hours as needed for nausea or vomiting. 11/02/2023: Newly prescribed---patient to start with chemo    sertraline  (ZOLOFT ) 50 MG tablet Take 50 mg by mouth in the morning.    tacrolimus (PROTOPIC) 0.03 % ointment Apply 1 Application topically 2 (two) times daily as needed (skin irritation.).    TIROSINT  100 MCG CAPS Take 100 mcg by mouth See admin instructions. Take 1 capsule (100 mcg) by mouth every 3 days, alternating with (Tirosint  88 mcg)--take on an empty stomach.    TIROSINT  88 MCG CAPS Take 88 mcg by mouth See admin instructions. Take 1 capsule (88 mcg) by mouth 2 days in a row,  alternating with Tirosint  (100 mcg) on the 3 rd day.    triamcinolone ointment (KENALOG) 0.1 % Apply 1 Application topically 2 (two) times daily as needed (skin irritation.).    valACYclovir (VALTREX) 500 MG tablet Take 500 mg by mouth 2 (two) times daily as needed (cold sore/fever blisters).    No facility-administered encounter medications on file as of 04/20/2024.     There were no vitals filed for this visit. There is no height or weight on file to calculate BMI.   ECOG PERFORMANCE STATUS: {CHL ONC ECOG [phone]}  PHYSICAL EXAM GENERAL:alert, no distress and comfortable SKIN: no rash  EYES: sclera clear NECK: without mass LYMPH:  no palpable cervical or supraclavicular lymphadenopathy  LUNGS: clear with normal breathing effort HEART: regular rate & rhythm, no lower extremity edema ABDOMEN: abdomen soft, non-tender and normal bowel sounds NEURO: alert & oriented x 3 with fluent speech, no focal motor/sensory deficits Breast  exam:  PAC without erythema    CBC    Latest Ref Rng & Units 04/11/2024   10:22 AM 03/23/2024   10:36 AM 03/02/2024   11:16 AM  CBC  WBC 4.0 - 10.5 K/uL 5.0  4.6  5.3   Hemoglobin 12.0 - 15.0 g/dL 88.3  88.8  89.9   Hematocrit 36.0 - 46.0 % 35.1  34.0  30.2   Platelets 150 - 400 K/uL 176  209  274       CMP     Latest Ref Rng & Units 03/23/2024   10:36 AM 03/02/2024   11:16 AM 02/10/2024   12:36 PM  CMP  Glucose 70 - 99 mg/dL 867  862  892   BUN 8 - 23 mg/dL 8  10  12    Creatinine 0.44 - 1.00 mg/dL 9.38  9.50  9.50   Sodium 135 - 145 mmol/L 140  140  140   Potassium 3.5 - 5.1 mmol/L 3.8  3.5  3.7   Chloride 98 - 111 mmol/L 102  100  104   CO2 22 - 32 mmol/L 27  28  30    Calcium  8.9 - 10.3 mg/dL 9.6  9.7  9.3   Total Protein 6.5 - 8.1 g/dL 7.5  7.4  7.1   Total Bilirubin 0.0 - 1.2 mg/dL 0.6  0.4  0.5   Alkaline Phos 38 - 126 U/L 552  860  161   AST 15 - 41 U/L 62  225  43   ALT 0 - 44 U/L 171  308  59       ASSESSMENT &  PLAN: Malignant neoplasm of upper-outer quadrant of right breast in female, estrogen receptor negative (HCC) IDC and DCIS, Stage IA, pT1b, cN0, triple negative, Grade 3, chest wall recurrence in 10/2023  -found on screening mammogram. S/p lumpectomy on 01/08/22 with Dr. Aron, path showed: 8 mm invasive and in situ ductal carcinoma with negative margins. Repeat prognostic panel confirmed triple negative disease.  -lymph node biopsies 01/28/22 showed negative nodes (0/2). Port placed at time of procedure. Postoperative course complicated by pneumothorax, which has resolved. -she began adjuvant TC on 02/20/22, and completed planned 4 cycles on 04/24/2022. -she completed adjuvant RT on 07/09/2022  -biopsy confirmed chest lymph nodes (axilla and chest wall including posterior pectoralis muscle)  recurrence in 10/2023 - Case reviewed in breast tumor board, plan to start neoadjuvant chemotherapy with carboplatin  and paclitaxel  every 3 weeks for 4 cycles, followed by AC every 3 weeks for 4 cycles, along with immunotherapy Keytruda  for 1 year.  If she has good response to treatment, Dr. Aron will consider surgery although she is not able to remove the aubpectoral lymph node.  She may need adjuvant radiation after surgery. -PET showed mixed response to chemo-immunotherapy. Imaging shows resolution of two previously positive lymph nodes but persistent/increased metabolic activity in a third retropectoral node. Current regimen has caused significant toxicity and suboptimal response. Surgical intervention is complicated by the residual node's location, indeterminate pulmonary nodule, and possible low-grade pneumonitis. Transition to oral capecitabine  starting 04/11/24 with plan to restage after 2 months therapy    PLAN:  No orders of the defined types were placed in this encounter.     All questions were answered. The patient knows to call the clinic with any problems, questions or concerns. No barriers to  learning were detected. I spent *** counseling the patient face to face. The total time spent in the appointment was *** and  more than 50% was on counseling, review of test results, and coordination of care.   Ruthella Kirchman K Jaiyah Beining, NP 04/19/2024 4:14 PM  "

## 2024-04-20 ENCOUNTER — Encounter: Payer: Self-pay | Admitting: Nurse Practitioner

## 2024-04-20 ENCOUNTER — Inpatient Hospital Stay: Attending: Hematology | Admitting: Nurse Practitioner

## 2024-04-20 ENCOUNTER — Other Ambulatory Visit (HOSPITAL_COMMUNITY): Payer: Self-pay

## 2024-04-20 ENCOUNTER — Other Ambulatory Visit: Payer: Self-pay

## 2024-04-20 ENCOUNTER — Inpatient Hospital Stay

## 2024-04-20 ENCOUNTER — Encounter: Payer: Self-pay | Admitting: Hematology

## 2024-04-20 VITALS — BP 112/72 | HR 80 | Temp 98.2°F | Resp 16 | Ht 67.0 in | Wt 134.9 lb

## 2024-04-20 DIAGNOSIS — C50411 Malignant neoplasm of upper-outer quadrant of right female breast: Secondary | ICD-10-CM

## 2024-04-20 DIAGNOSIS — Z171 Estrogen receptor negative status [ER-]: Secondary | ICD-10-CM

## 2024-04-20 LAB — CBC WITH DIFFERENTIAL (CANCER CENTER ONLY)
Abs Immature Granulocytes: 0.01 K/uL (ref 0.00–0.07)
Basophils Absolute: 0 K/uL (ref 0.0–0.1)
Basophils Relative: 1 %
Eosinophils Absolute: 0.2 K/uL (ref 0.0–0.5)
Eosinophils Relative: 4 %
HCT: 34.7 % — ABNORMAL LOW (ref 36.0–46.0)
Hemoglobin: 11.4 g/dL — ABNORMAL LOW (ref 12.0–15.0)
Immature Granulocytes: 0 %
Lymphocytes Relative: 23 %
Lymphs Abs: 1.1 K/uL (ref 0.7–4.0)
MCH: 33.2 pg (ref 26.0–34.0)
MCHC: 32.9 g/dL (ref 30.0–36.0)
MCV: 101.2 fL — ABNORMAL HIGH (ref 80.0–100.0)
Monocytes Absolute: 0.4 K/uL (ref 0.1–1.0)
Monocytes Relative: 9 %
Neutro Abs: 3.2 K/uL (ref 1.7–7.7)
Neutrophils Relative %: 63 %
Platelet Count: 186 K/uL (ref 150–400)
RBC: 3.43 MIL/uL — ABNORMAL LOW (ref 3.87–5.11)
RDW: 13.7 % (ref 11.5–15.5)
WBC Count: 5 K/uL (ref 4.0–10.5)
nRBC: 0 % (ref 0.0–0.2)

## 2024-04-20 LAB — CMP (CANCER CENTER ONLY)
ALT: 190 U/L — ABNORMAL HIGH (ref 0–44)
AST: 51 U/L — ABNORMAL HIGH (ref 15–41)
Albumin: 4.2 g/dL (ref 3.5–5.0)
Alkaline Phosphatase: 474 U/L — ABNORMAL HIGH (ref 38–126)
Anion gap: 10 (ref 5–15)
BUN: 9 mg/dL (ref 8–23)
CO2: 28 mmol/L (ref 22–32)
Calcium: 9.6 mg/dL (ref 8.9–10.3)
Chloride: 102 mmol/L (ref 98–111)
Creatinine: 0.52 mg/dL (ref 0.44–1.00)
GFR, Estimated: >60 mL/min
Glucose, Bld: 97 mg/dL (ref 70–99)
Potassium: 3.9 mmol/L (ref 3.5–5.1)
Sodium: 140 mmol/L (ref 135–145)
Total Bilirubin: 0.7 mg/dL (ref 0.0–1.2)
Total Protein: 7.4 g/dL (ref 6.5–8.1)

## 2024-04-25 ENCOUNTER — Other Ambulatory Visit: Payer: Self-pay | Admitting: Pharmacist

## 2024-05-01 ENCOUNTER — Other Ambulatory Visit: Payer: Self-pay | Admitting: Nurse Practitioner

## 2024-05-02 ENCOUNTER — Encounter: Payer: Self-pay | Admitting: Hematology

## 2024-05-02 NOTE — Progress Notes (Unsigned)
 "    Servando Neth, MD Reason for referral-coronary calcification  HPI: 64 year old female for evaluation of coronary calcification at request of [name], MD.  Calcium  score at [facility] February 2025 49 which was 75th percentile.  Echocardiogram July 2025 showed normal LV function.  Cardiology asked to evaluate.  Current Outpatient Medications  Medication Sig Dispense Refill   acetaminophen  (TYLENOL ) 325 MG tablet Take 650 mg by mouth every 6 (six) hours as needed.     ALPRAZolam  (XANAX ) 0.5 MG tablet Take 1 tablet (0.5 mg total) by mouth daily as needed for anxiety. 30 tablet 1   atorvastatin (LIPITOR) 20 MG tablet Take 20 mg by mouth every evening.     betamethasone dipropionate 0.05 % cream Apply 1 Application topically 2 (two) times daily as needed (skin irritation.).     Calcium  Carb-Cholecalciferol (CALCIUM -VITAMIN D3) 250-125 MG-UNIT TABS Take 1 tablet by mouth daily with lunch.     capecitabine  (XELODA ) 500 MG tablet Take 3 tablets (1,500 mg total) by mouth 2 (two) times daily after a meal. Take within 30 minutes after meal, every 10-12 hours. Take for 7 days on, then off for 7 days. Repeat every 14 days. 84 tablet 1   Cholecalciferol (VITAMIN D3) 50 MCG (2000 UT) TABS Take 2,000 Units by mouth daily with lunch.     Clobetasol  Prop Emollient Base 0.05 % emollient cream Apply 1 Application topically 2 (two) times daily. 60 g 1   ezetimibe (ZETIA) 10 MG tablet Take 10 mg by mouth every evening.     famotidine  (PEPCID ) 20 MG tablet Take 1 tablet (20 mg total) by mouth daily. (Patient taking differently: Take 20 mg by mouth daily as needed for heartburn or indigestion.)     fluconazole  (DIFLUCAN ) 100 MG tablet Take 2 tablets (200 mg total) by mouth daily. 28 tablet 0   lidocaine -prilocaine  (EMLA ) cream Apply to affected area once (Patient not taking: Reported on 03/23/2024) 30 g 3   loperamide (IMODIUM) 2 MG capsule Take by mouth as needed for diarrhea or loose stools.     magic  mouthwash (nystatin , diphenhydrAMINE , alum & mag hydroxide) suspension mixture Swish and spit 5 mLs 4 (four) times daily as needed for mouth pain. 140 mL 1   mirtazapine  (REMERON ) 7.5 MG tablet Take 1 tablet (7.5 mg total) by mouth at bedtime. 30 tablet 0   nystatin  (MYCOSTATIN ) 100000 UNIT/ML suspension Take 5 mLs (500,000 Units total) by mouth 4 (four) times daily. 473 mL 0   nystatin -triamcinolone (MYCOLOG II) cream Apply 1 Application topically 2 (two) times daily as needed (irritation).     ondansetron  (ZOFRAN ) 8 MG tablet Take 1 tablet (8 mg total) by mouth every 8 (eight) hours as needed for nausea or vomiting. Start on the third day after chemotherapy. 30 tablet 1   pantoprazole  (PROTONIX ) 40 MG tablet TAKE 1 TABLET BY MOUTH 2 TIMES A DAY 60 tablet 2   prochlorperazine  (COMPAZINE ) 10 MG tablet Take 1 tablet (10 mg total) by mouth every 6 (six) hours as needed for nausea or vomiting. 30 tablet 1   sertraline  (ZOLOFT ) 50 MG tablet Take 50 mg by mouth in the morning.     tacrolimus (PROTOPIC) 0.03 % ointment Apply 1 Application topically 2 (two) times daily as needed (skin irritation.).     TIROSINT  100 MCG CAPS Take 100 mcg by mouth See admin instructions. Take 1 capsule (100 mcg) by mouth every 3 days, alternating with (Tirosint  88 mcg)--take on an empty stomach.  TIROSINT  88 MCG CAPS Take 88 mcg by mouth See admin instructions. Take 1 capsule (88 mcg) by mouth 2 days in a row, alternating with Tirosint  (100 mcg) on the 3 rd day.     triamcinolone ointment (KENALOG) 0.1 % Apply 1 Application topically 2 (two) times daily as needed (skin irritation.).     valACYclovir (VALTREX) 500 MG tablet Take 500 mg by mouth 2 (two) times daily as needed (cold sore/fever blisters).     No current facility-administered medications for this visit.    Allergies[1]   Past Medical History:  Diagnosis Date   Allergy    Anxiety 2015   Result of culmination of super stressful caregiving and deaths of 2  immediate family members.  Overall do pretty well.   Cancer [facility]) 2023   right breast   Coronary artery calcification    Depression    denies   GERD (gastroesophageal reflux disease)    Hyperlipidemia    Hypothyroidism    Right carpal tunnel syndrome 09/19/2019   Thyroid  disease     Past Surgical History:  Procedure Laterality Date   BREAST BIOPSY Left 12/04/2022   MM LT BREAST BX W LOC DEV 1ST LESION IMAGE BX SPEC STEREO GUIDE 12/04/2022 [facility]   BREAST BIOPSY  12/30/2022   MM LT RADIOACTIVE SEED LOC MAMMO GUIDE 12/30/2022 [facility]   BREAST LUMPECTOMY WITH RADIOACTIVE SEED LOCALIZATION Right 01/08/2022   Procedure: RIGHT BREAST LUMPECTOMY WITH RADIOACTIVE SEED LOCALIZATION;  Surgeon: [name], MD;  [facility];  Service: General;  Laterality: Right;   BREAST LUMPECTOMY WITH RADIOACTIVE SEED LOCALIZATION Left 01/01/2023   Procedure: LEFT BREAST SEED LOCALIZED LUMPECTOMY;  Surgeon: [name], MD;  [facility];  Service: General;  Laterality: Left;   PORT-A-CATH REMOVAL Left 05/20/2022   Procedure: REMOVAL PORT-A-CATH;  Surgeon: [name], MD;  Location: [facility];  Service: General;  Laterality: Left;   PORTACATH PLACEMENT Left 01/28/2022   Procedure: INSERTION PORT-A-CATH;  Surgeon: [name], MD;  Location: [facility];  Service: General;  Laterality: Left;   PORTACATH PLACEMENT Left 11/04/2023   Procedure: INSERTION, TUNNELED CENTRAL VENOUS DEVICE, WITH PORT;  Surgeon: [name], MD;  Location: [facility];  Service: General;  Laterality: Left;  PORT PLACEMENT WITH ULTRASOUND GUIDANCE   SENTINEL NODE BIOPSY Right 01/28/2022   Procedure: RIGHT SENTINEL LYMPH NODE BIOPSY;  Surgeon: [name], MD;  Location: [facility];  Service: General;  Laterality: Right;   TONSILLECTOMY      Social History   Socioeconomic History   Marital status: Married    Spouse name: Not on file    Number of children: 1   Years of education: Not on file   Highest education level: Not on file  Occupational History   Not on file  Tobacco Use   Smoking status: Never   Smokeless tobacco: Never  Vaping Use   Vaping status: Never Used  Substance and Sexual Activity   Alcohol use: Yes    Comment: maybe 1 drink 4 times a year   Drug use: No   Sexual activity: Not Currently    Birth control/protection: Post-menopausal  Other Topics Concern   Not on file  Social History Narrative   Not on file   Social Drivers of Health   Tobacco Use: Low Risk (04/20/2024)   Patient History    Smoking Tobacco Use: Never    Smokeless Tobacco Use: Never    Passive Exposure:  Not on file  Financial Resource Strain: Not on file  Food Insecurity: No Food Insecurity (01/28/2023)   Hunger Vital Sign    Worried About Running Out of Food in the Last Year: Never true    Ran Out of Food in the Last Year: Never true  Transportation Needs: No Transportation Needs (01/28/2023)   PRAPARE - Administrator, Civil Service (Medical): No    Lack of Transportation (Non-Medical): No  Physical Activity: Not on file  Stress: Not on file  Social Connections: Not on file  Intimate Partner Violence: Not At Risk (01/28/2023)   Humiliation, Afraid, Rape, and Kick questionnaire    Fear of Current or Ex-Partner: No    Emotionally Abused: No    Physically Abused: No    Sexually Abused: No  Depression (PHQ2-9): Low Risk (04/20/2024)   Depression (PHQ2-9)    PHQ-2 Score: 0  Alcohol Screen: Not on file  Housing: Unknown (11/02/2023)   Received from [facility]   Epic    Unable to Pay for Housing in the Last Year: Not on file    Number of Times Moved in the Last Year: Not on file    At any time in the past 12 months, were you homeless or living in a shelter (including now)?: No  Utilities: Not At Risk (01/28/2023)   [employer] Utilities    Threatened with loss of utilities: No  Health Literacy: Not  on file    Family History  Problem Relation Age of Onset   Hyperlipidemia Father    Diabetes Maternal Grandmother     ROS: no fevers or chills, productive cough, hemoptysis, dysphasia, odynophagia, melena, hematochezia, dysuria, hematuria, rash, seizure activity, orthopnea, PND, pedal edema, claudication. Remaining systems are negative.  Physical Exam:   There were no vitals taken for this visit.  General:  Well developed/well nourished in NAD Skin warm/dry Patient not depressed No peripheral clubbing Back-normal HEENT-normal/normal eyelids Neck supple/normal carotid upstroke bilaterally; no bruits; no JVD; no thyromegaly chest - CTA/ normal expansion CV - RRR/normal S1 and S2; no murmurs, rubs or gallops;  PMI nondisplaced Abdomen -NT/ND, no HSM, no mass, + bowel sounds, no bruit 2+ femoral pulses, no bruits Ext-no edema, chords, 2+ DP Neuro-grossly nonfocal  ECG - personally reviewed  A/P  1 coronary calcification-  2 hyperlipidemia-  Redell Shallow, MD     [1]  Allergies Allergen Reactions   Nortriptyline  Other (See Comments)    depression   Singulair  [Montelukast  Sodium] Other (See Comments)    Depressed mood   Sulfonamide Derivatives Nausea And Vomiting   Amitriptyline  Hcl     Depression mood   Esomeprazole Hives   Fluticasone      Other Reaction(s): may have caused hearing loss   Gabapentin      Hair Loss   Lexapro [Escitalopram]     Depression mood   Neomycin     Other Reaction(s): topical irritates the skin badly.   Paclitaxel     Pseudoephedrine     Other Reaction(s): HEART RACES   Neosporin [Bacitracin-Polymyxin B] Rash   "

## 2024-05-03 ENCOUNTER — Inpatient Hospital Stay

## 2024-05-03 ENCOUNTER — Inpatient Hospital Stay: Admitting: Hematology

## 2024-05-03 ENCOUNTER — Other Ambulatory Visit: Payer: Self-pay

## 2024-05-03 ENCOUNTER — Telehealth: Payer: Self-pay | Admitting: Hematology

## 2024-05-03 NOTE — Assessment & Plan Note (Signed)
 IDC and DCIS, Stage IA, pT1b, cN0, triple negative, Grade 3, chest wall recurrence in 10/2023  -found on screening mammogram. S/p lumpectomy on 01/08/22 with Dr. Aron, path showed: 8 mm invasive and in situ ductal carcinoma with negative margins. Repeat prognostic panel confirmed triple negative disease.  -lymph node biopsies 01/28/22 showed negative nodes (0/2). Port placed at time of procedure. Postoperative course complicated by pneumothorax, which has resolved. -she began adjuvant TC on 02/20/22, and completed planned 4 cycles on 04/24/2022. -she completed adjuvant RT on 07/09/2022  -biopsy confirmed chest lymph nodes (axilla and chest wall including posteriad pectoralis muscle)  recurrence in 10/2023 - Case reviewed in breast tumor board, plan to start neoadjuvant chemotherapy with carboplatin  and paclitaxel  every 3 weeks for 4 cycles, followed by AC every 3 weeks for 4 cycles, along with immunotherapy Keytruda  for 1 year.  If she has good response to treatment, Dr. Aron will consider surgery although she is not able to remove the aubpectoral lymph node.  She may need adjuvant radiation after surgery. -she initially tolerated the chemotherapy mother well, but developed a cumulative side effects, and chemo stopped after 1 cycle of AC. -PET 03/11/2024 showed mixed response. Chemo changed to oral Xeloda  around 04/08/2024

## 2024-05-04 ENCOUNTER — Ambulatory Visit (HOSPITAL_COMMUNITY)
Admission: RE | Admit: 2024-05-04 | Discharge: 2024-05-04 | Disposition: A | Discharge status: Home / Self Care | Source: Ambulatory Visit | Attending: Hematology | Admitting: Hematology

## 2024-05-04 ENCOUNTER — Inpatient Hospital Stay

## 2024-05-04 ENCOUNTER — Encounter (HOSPITAL_COMMUNITY): Payer: Self-pay

## 2024-05-04 DIAGNOSIS — Z171 Estrogen receptor negative status [ER-]: Secondary | ICD-10-CM | POA: Insufficient documentation

## 2024-05-04 DIAGNOSIS — C50411 Malignant neoplasm of upper-outer quadrant of right female breast: Secondary | ICD-10-CM | POA: Insufficient documentation

## 2024-05-04 DIAGNOSIS — D0512 Intraductal carcinoma in situ of left breast: Secondary | ICD-10-CM

## 2024-05-04 LAB — CBC WITH DIFFERENTIAL (CANCER CENTER ONLY)
Abs Immature Granulocytes: 0.01 K/uL (ref 0.00–0.07)
Basophils Absolute: 0 K/uL (ref 0.0–0.1)
Basophils Relative: 1 %
Eosinophils Absolute: 0.1 K/uL (ref 0.0–0.5)
Eosinophils Relative: 3 %
HCT: 35.3 % — ABNORMAL LOW (ref 36.0–46.0)
Hemoglobin: 11.7 g/dL — ABNORMAL LOW (ref 12.0–15.0)
Immature Granulocytes: 0 %
Lymphocytes Relative: 24 %
Lymphs Abs: 1 K/uL (ref 0.7–4.0)
MCH: 33.7 pg (ref 26.0–34.0)
MCHC: 33.1 g/dL (ref 30.0–36.0)
MCV: 101.7 fL — ABNORMAL HIGH (ref 80.0–100.0)
Monocytes Absolute: 0.4 K/uL (ref 0.1–1.0)
Monocytes Relative: 8 %
Neutro Abs: 2.7 K/uL (ref 1.7–7.7)
Neutrophils Relative %: 64 %
Platelet Count: 178 K/uL (ref 150–400)
RBC: 3.47 MIL/uL — ABNORMAL LOW (ref 3.87–5.11)
RDW: 13.8 % (ref 11.5–15.5)
WBC Count: 4.2 K/uL (ref 4.0–10.5)
nRBC: 0 % (ref 0.0–0.2)

## 2024-05-04 MED ORDER — HEPARIN SOD (PORK) LOCK FLUSH 100 UNIT/ML IV SOLN
500.0000 [IU] | Freq: Once | INTRAVENOUS | Status: AC
  Administered 2024-05-04: 500 [IU] via INTRAVENOUS

## 2024-05-04 MED ORDER — HEPARIN SOD (PORK) LOCK FLUSH 100 UNIT/ML IV SOLN
INTRAVENOUS | Status: AC
  Filled 2024-05-04: qty 5

## 2024-05-05 ENCOUNTER — Other Ambulatory Visit: Payer: Self-pay | Admitting: Hematology

## 2024-05-05 ENCOUNTER — Encounter: Payer: Self-pay | Admitting: Nurse Practitioner

## 2024-05-05 DIAGNOSIS — C50411 Malignant neoplasm of upper-outer quadrant of right female breast: Secondary | ICD-10-CM

## 2024-05-06 ENCOUNTER — Other Ambulatory Visit: Payer: Self-pay

## 2024-05-08 ENCOUNTER — Encounter: Payer: Self-pay | Admitting: Hematology

## 2024-05-11 ENCOUNTER — Other Ambulatory Visit: Payer: Self-pay

## 2024-05-11 ENCOUNTER — Other Ambulatory Visit: Payer: Self-pay | Admitting: Hematology

## 2024-05-11 DIAGNOSIS — Z171 Estrogen receptor negative status [ER-]: Secondary | ICD-10-CM

## 2024-05-11 DIAGNOSIS — D0512 Intraductal carcinoma in situ of left breast: Secondary | ICD-10-CM

## 2024-05-11 NOTE — Progress Notes (Signed)
 Verbal order w/readback order from Dr. Lanny for Guardant Reveal to be drawn on 05/12/2024. Order placed in EPIC and in [employer] portal.  Guardant Reveal kit and requisition given to [facility] Lab Receptionist.

## 2024-05-12 ENCOUNTER — Inpatient Hospital Stay: Admitting: Hematology

## 2024-05-12 ENCOUNTER — Inpatient Hospital Stay

## 2024-05-12 VITALS — BP 98/67 | HR 78 | Temp 98.0°F | Resp 17 | Wt 138.8 lb

## 2024-05-12 DIAGNOSIS — C50411 Malignant neoplasm of upper-outer quadrant of right female breast: Secondary | ICD-10-CM

## 2024-05-12 DIAGNOSIS — D0512 Intraductal carcinoma in situ of left breast: Secondary | ICD-10-CM

## 2024-05-12 DIAGNOSIS — Z171 Estrogen receptor negative status [ER-]: Secondary | ICD-10-CM | POA: Diagnosis not present

## 2024-05-12 LAB — CBC WITH DIFFERENTIAL/PLATELET
Abs Immature Granulocytes: 0.01 10*3/uL (ref 0.00–0.07)
Basophils Absolute: 0.1 10*3/uL (ref 0.0–0.1)
Basophils Relative: 1 %
Eosinophils Absolute: 0.2 10*3/uL (ref 0.0–0.5)
Eosinophils Relative: 3 %
HCT: 35.5 % — ABNORMAL LOW (ref 36.0–46.0)
Hemoglobin: 11.7 g/dL — ABNORMAL LOW (ref 12.0–15.0)
Immature Granulocytes: 0 %
Lymphocytes Relative: 26 %
Lymphs Abs: 1.3 10*3/uL (ref 0.7–4.0)
MCH: 33.5 pg (ref 26.0–34.0)
MCHC: 33 g/dL (ref 30.0–36.0)
MCV: 101.7 fL — ABNORMAL HIGH (ref 80.0–100.0)
Monocytes Absolute: 0.4 10*3/uL (ref 0.1–1.0)
Monocytes Relative: 9 %
Neutro Abs: 3.1 10*3/uL (ref 1.7–7.7)
Neutrophils Relative %: 61 %
Platelets: 181 10*3/uL (ref 150–400)
RBC: 3.49 MIL/uL — ABNORMAL LOW (ref 3.87–5.11)
RDW: 14.6 % (ref 11.5–15.5)
WBC: 5 10*3/uL (ref 4.0–10.5)
nRBC: 0 % (ref 0.0–0.2)

## 2024-05-12 LAB — COMPREHENSIVE METABOLIC PANEL WITH GFR
ALT: 24 U/L (ref 0–44)
AST: 30 U/L (ref 15–41)
Albumin: 4.4 g/dL (ref 3.5–5.0)
Alkaline Phosphatase: 216 U/L — ABNORMAL HIGH (ref 38–126)
Anion gap: 10 (ref 5–15)
BUN: 15 mg/dL (ref 8–23)
CO2: 28 mmol/L (ref 22–32)
Calcium: 9.4 mg/dL (ref 8.9–10.3)
Chloride: 101 mmol/L (ref 98–111)
Creatinine, Ser: 0.56 mg/dL (ref 0.44–1.00)
GFR, Estimated: >60 mL/min
Glucose, Bld: 92 mg/dL (ref 70–99)
Potassium: 4 mmol/L (ref 3.5–5.1)
Sodium: 139 mmol/L (ref 135–145)
Total Bilirubin: 0.4 mg/dL (ref 0.0–1.2)
Total Protein: 7.2 g/dL (ref 6.5–8.1)

## 2024-05-12 NOTE — Progress Notes (Signed)
 " [facility]   Telephone:([phone] [phone]   Clinic Follow up Note   Patient Care Team: [name], MD as PCP - General (Internal Medicine) [name], RN as Oncology Nurse Navigator [name], MD as Consulting Physician (General Surgery) [name], MD as Consulting Physician (Hematology) [name], MD as Consulting Physician (Radiation Oncology) [name], MD as Consulting Physician (Endocrinology) [name], MD as Consulting Physician (Sports Medicine) [name], MD as Consulting Physician (Obstetrics and Gynecology) [name], [name], NP as Nurse Practitioner (Nurse Practitioner) Imaging, [facility] as Radiologist (Diagnostic Radiology) Kermit Lear Danas, DO as Referring Physician (Endocrinology)  Date of Service:  05/12/2024  CHIEF COMPLAINT: f/u of metastatic breast cancer  CURRENT THERAPY:  Xeloda   Oncology History   Malignant neoplasm of upper-outer quadrant of right breast in female, estrogen receptor negative (HCC) IDC and DCIS, Stage IA, pT1b, cN0, triple negative, Grade 3, chest wall recurrence in 10/2023  -found on screening mammogram. S/p lumpectomy on 01/08/22 with Dr. Aron, path showed: 8 mm invasive and in situ ductal carcinoma with negative margins. Repeat prognostic panel confirmed triple negative disease.  -lymph node biopsies 01/28/22 showed negative nodes (0/2). Port placed at time of procedure. Postoperative course complicated by pneumothorax, which has resolved. -she began adjuvant TC on 02/20/22, and completed planned 4 cycles on 04/24/2022. -she completed adjuvant RT on 07/09/2022  -biopsy confirmed chest lymph nodes (axilla and chest wall including posteriad pectoralis muscle)  recurrence in 10/2023 - Case reviewed in breast tumor board, plan to start neoadjuvant chemotherapy with carboplatin  and paclitaxel  every 3 weeks for 4 cycles, followed by AC every 3 weeks for 4 cycles,  along with immunotherapy Keytruda  for 1 year.  If she has good response to treatment, Dr. Aron will consider surgery although she is not able to remove the aubpectoral lymph node.  She may need adjuvant radiation after surgery. -she initially tolerated the chemotherapy mother well, but developed a cumulative side effects, and chemo stopped after 1 cycle of AC. -PET 03/11/2024 showed mixed response. Chemo changed to oral Xeloda  around 04/08/2024   Assessment & Plan Recurrent triple negative breast cancer of the right upper-outer quadrant She is receiving oral capecitabine  with good tolerability, improved appetite, weight gain, and only mild side effects. Recent CT chest demonstrated resolution of prior inflammatory changes and subcarinal nodule, without new lymphadenopathy. She remains clinically stable and more active at home. - Continued capecitabine  1,500 mg orally twice daily, 7 days on/7 days off. - Scheduled monthly follow-up visits. - Advised maintenance of current activity level and vigilance for new symptoms.  Chemotherapy-induced peripheral neuropathy Chronic tingling and mild numbness in her feet (4/10), attributed to prior chemotherapy. Symptoms are stable, not interfering with sleep, and without new or worsening features. - Recommended over-the-counter vitamin B12 or B complex supplementation to support nerve recovery. - Advised regular exercise (walking or stationary biking) to improve circulation and aid nerve recovery. - Advised to keep feet warm, particularly at night.  Elevated liver enzymes secondary to chemotherapy Elevated liver enzymes likely secondary to prior chemotherapy, with recent improvement and persistently normal bilirubin. Awaiting most recent laboratory results to determine safety of resuming atorvastatin. - Monitored liver function tests; will review results in MyChart. - If liver enzymes have normalized, will resume atorvastatin as previously  prescribed.  Plan - She is tolerating capecitabine  very well, will continue - Follow-up in 1 months. - Plan repeat PET scan in 2 months   SUMMARY  OF ONCOLOGIC HISTORY: Oncology History Overview Note   Cancer Staging  Malignant neoplasm of upper-outer quadrant of right breast in female, estrogen receptor negative Staging form: Breast, AJCC 8th Edition - Clinical stage from 12/23/2021: cT1b, cM0, G3, ER-, PR-, HER2- - Unsigned Stage prefix: Initial diagnosis Histologic grading system: 3 grade system - Pathologic stage from 01/08/2022: Stage IB (pT1b, pN0, cM0, G3, ER-, PR-, HER2-) - Signed by [name], MD on 01/24/2022 Stage prefix: Initial diagnosis Histologic grading system: 3 grade system Residual tumor (R): R0 - None     Malignant neoplasm of upper-outer quadrant of right breast in female, estrogen receptor negative (HCC)  12/06/2021 Mammogram   CLINICAL DATA:  Patient returns today to evaluate RIGHT breast calcifications identified on a recent screening mammogram.   EXAM: DIGITAL DIAGNOSTIC UNILATERAL RIGHT MAMMOGRAM  IMPRESSION: Grouped coarse heterogeneous calcifications within the upper-outer quadrant of the RIGHT breast, measuring 6 mm extent. These may be fibroadenomatous calcifications. Stereotactic biopsy is recommended to exclude malignancy.   12/18/2021 Initial Biopsy   Diagnosis Breast, right, needle core biopsy, upper outer quadrant, x clip - DUCTAL CARCINOMA IN SITU, SOLID AND CRIBRIFORM TYPE WITH COMEDONECROSIS AND ASSOCIATED CALCIFICATIONS, NUCLEAR GRADE 3 OF 3 - FOCAL MICROINVASION IS PRESENT (LESS THAN 1 MM) WITH EVIDENCE OF LYMPHOVASCULAR INVASION - NECROSIS: PRESENT - CALCIFICATIONS: PRESENT - DCIS LENGTH: 7 MM IN GREATEST LINEAR DIMENSION ON FRAGMENTED CORES  PROGNOSTIC INDICATORS Results: IMMUNOHISTOCHEMICAL AND MORPHOMETRIC ANALYSIS PERFORMED MANUALLY The tumor cells are NEGATIVE for Her2 (1+). Estrogen Receptor: 0%, NEGATIVE Progesterone Receptor:  0%, NEGATIVE Proliferation Marker Ki67: 60%   12/23/2021 Initial Diagnosis   Malignant neoplasm of upper-outer quadrant of right breast in female, estrogen receptor negative (HCC)   12/23/2021 Imaging   EXAM: ULTRASOUND OF THE RIGHT AXILLA  IMPRESSION: No abnormal appearing RIGHT axillary lymph nodes.   01/08/2022 Cancer Staging   Staging form: Breast, AJCC 8th Edition - Pathologic stage from 01/08/2022: Stage IB (pT1b, pN0, cM0, G3, ER-, PR-, HER2-) - Signed by [name], MD on 01/24/2022 Stage prefix: Initial diagnosis Histologic grading system: 3 grade system Residual tumor (R): R0 - None   02/20/2022 - 04/24/2022 Chemotherapy   Patient is on Treatment Plan : BREAST TC q21d     10/06/2022 Survivorship   SCP delivered by Lacie [name], NP   11/13/2023 - 02/12/2024 Chemotherapy   Patient is on Treatment Plan : BREAST Pembrolizumab  (200) D1 + Carboplatin  (5) D1 + Paclitaxel  (80) D1,8,15 q21d X 4 cycles / Pembrolizumab  (200) D1 + AC D1 q21d x 4 cycles     Ductal carcinoma in situ (DCIS) of left breast  12/09/2022 Initial Diagnosis   Ductal carcinoma in situ (DCIS) of left breast   01/01/2023 Surgery   Let breast seed localized lumpectomy.with Dr. Aron  2.5 cm DCIS with negative margins.     02/11/2023 - 03/10/2023 Radiation Therapy   Dose per fraction - 2.66 Gy Prescribed dose (delivered/prescribed)  42.56/42.56 Prescribed Fxs (delivered/prescribed)  16/16  Boost  Dose per fraction -  2Gy Prescribed dose (delivered/prescribed) 8Gy/8Gy) Prescribed Fxs (delivered/prescribed) (4Gy/4Gy)        Discussed the use of AI scribe software for clinical note transcription with the patient, who gave verbal consent to proceed.  History of Present Illness Samantha [name] is a 64 year old female with recurrent triple negative right breast cancer who presents for follow-up of ongoing systemic therapy and treatment-related toxicities.  She is on oral capecitabine  started April 11, 2024,  3 tablets in the morning and 3  in the evening, 7 days on and 7 days off. She is tolerating therapy without nausea, diarrhea, or significant hand-foot syndrome. She notes mild skin thinning without cracking or peeling and occasional minor abrasions from her dog's nose. Appetite has improved with weight gain, and she still has diminished taste. She is more active at home and able to do most household tasks. Her hair is regrowing. She denies new breast masses, pain, or discharge. She has refilled her medication and has no remaining refills after the last fill.  Recent CT chest showed resolution of prior inflammatory changes and the previously noted subcarinal nodule. She has no cough or new respiratory symptoms.  She has persistent chemotherapy-induced peripheral neuropathy with tingling and mild numbness in her feet rated 4/10 at rest, not affecting sleep. She is not taking B12 or B complex and has not used medications for neuropathy.  She had elevated liver enzymes attributed to prior chemotherapy, with recent improvement and normal bilirubin. She previously took atorvastatin for hyperlipidemia but has not restarted it while awaiting further lab results.  Mar 23, 2024: Follow-up for recurrent right breast cancer after mixed response to chemotherapy and immunotherapy; significant toxicity and suboptimal response led to discontinuation of current regimen. Restaging PET showed resolution of two lymph nodes but persistent activity in retropectoral node. Transition to oral capecitabine  planned, with baseline labs and pharmacist education arranged. Additional concerns included oral candidiasis, peripheral neuropathy, unintentional weight loss, indeterminate pulmonary nodule, and low-grade pneumonitis; immunotherapy discontinued and repeat imaging scheduled.     All other systems were reviewed with the patient and are negative.  MEDICAL HISTORY:  Past Medical History:  Diagnosis Date   Allergy    Anxiety  2015   Result of culmination of super stressful caregiving and deaths of 2 immediate family members.  Overall do pretty well.   Cancer [facility]) 2023   right breast   Coronary artery calcification    Depression    denies   GERD (gastroesophageal reflux disease)    History of left breast cancer 2024   Hyperlipidemia    Hypothyroidism    Right carpal tunnel syndrome 09/19/2019   Thyroid  disease     SURGICAL HISTORY: Past Surgical History:  Procedure Laterality Date   BREAST BIOPSY Left 12/04/2022   MM LT BREAST BX W LOC DEV 1ST LESION IMAGE BX SPEC STEREO GUIDE 12/04/2022 [facility]   BREAST BIOPSY  12/30/2022   MM LT RADIOACTIVE SEED LOC MAMMO GUIDE 12/30/2022 [facility]   BREAST LUMPECTOMY WITH RADIOACTIVE SEED LOCALIZATION Right 01/08/2022   Procedure: RIGHT BREAST LUMPECTOMY WITH RADIOACTIVE SEED LOCALIZATION;  Surgeon: [name], MD;  Location: [facility] OR;  Service: General;  Laterality: Right;   BREAST LUMPECTOMY WITH RADIOACTIVE SEED LOCALIZATION Left 01/01/2023   Procedure: LEFT BREAST SEED LOCALIZED LUMPECTOMY;  Surgeon: [name], MD;  Location: [facility] OR;  Service: General;  Laterality: Left;   PORT-A-CATH REMOVAL Left 05/20/2022   Procedure: REMOVAL PORT-A-CATH;  Surgeon: [name], MD;  Location: [facility];  Service: General;  Laterality: Left;   PORTACATH PLACEMENT Left 01/28/2022   Procedure: INSERTION PORT-A-CATH;  Surgeon: [name], MD;  Location: [facility];  Service: General;  Laterality: Left;   PORTACATH PLACEMENT Left 11/04/2023   Procedure: INSERTION, TUNNELED CENTRAL VENOUS DEVICE, WITH PORT;  Surgeon: [name], MD;  Location: [facility];  Service: General;  Laterality: Left;  PORT PLACEMENT WITH ULTRASOUND GUIDANCE   SENTINEL NODE BIOPSY Right 01/28/2022   Procedure: RIGHT SENTINEL  LYMPH NODE BIOPSY;  Surgeon: [name], MD;  Location: [facility];  Service: General;  Laterality:  Right;   TONSILLECTOMY      I have reviewed the social history and family history with the patient and they are unchanged from previous note.  ALLERGIES:  is allergic to nortriptyline , singulair  [montelukast  sodium], sulfonamide derivatives, amitriptyline  hcl, esomeprazole, fluticasone , gabapentin , lexapro [escitalopram], neomycin, paclitaxel , pseudoephedrine, and neosporin [bacitracin-polymyxin b].  MEDICATIONS:  Current Outpatient Medications  Medication Sig Dispense Refill   acetaminophen  (TYLENOL ) 325 MG tablet Take 650 mg by mouth every 6 (six) hours as needed.     ALPRAZolam  (XANAX ) 0.5 MG tablet Take 1 tablet (0.5 mg total) by mouth daily as needed for anxiety. 30 tablet 1   atorvastatin (LIPITOR) 20 MG tablet Take 20 mg by mouth every evening.     betamethasone dipropionate 0.05 % cream Apply 1 Application topically 2 (two) times daily as needed (skin irritation.).     Calcium  Carb-Cholecalciferol (CALCIUM -VITAMIN D3) 250-125 MG-UNIT TABS Take 1 tablet by mouth daily with lunch.     capecitabine  (XELODA ) 500 MG tablet TAKE 3 TABLETS BY MOUTH 2 TIMES A DAY AFTER A MEAL. TAKE WITHIN 30 MINUTES AFTER MEAL, EVERY 10-12 HOURS. TAKE FOR 7 DAYS ON, THEN 7 DAYS OFESGF REPEAT EVERY 14 DAYS. START 04/11/2024 84 tablet 1   Cholecalciferol (VITAMIN D3) 50 MCG (2000 UT) TABS Take 2,000 Units by mouth daily with lunch.     Clobetasol  Prop Emollient Base 0.05 % emollient cream Apply 1 Application topically 2 (two) times daily. 60 g 1   ezetimibe (ZETIA) 10 MG tablet Take 10 mg by mouth every evening.     famotidine  (PEPCID ) 20 MG tablet Take 1 tablet (20 mg total) by mouth daily. (Patient taking differently: Take 20 mg by mouth daily as needed for heartburn or indigestion.)     fluconazole  (DIFLUCAN ) 100 MG tablet Take 2 tablets (200 mg total) by mouth daily. 28 tablet 0   lidocaine -prilocaine  (EMLA ) cream Apply to affected area once 30 g 3   magic mouthwash (nystatin , diphenhydrAMINE , alum & mag  hydroxide) suspension mixture Swish and spit 5 mLs 4 (four) times daily as needed for mouth pain. 140 mL 1   nystatin  (MYCOSTATIN ) 100000 UNIT/ML suspension Take 5 mLs (500,000 Units total) by mouth 4 (four) times daily. 473 mL 0   nystatin -triamcinolone (MYCOLOG II) cream Apply 1 Application topically 2 (two) times daily as needed (irritation).     ondansetron  (ZOFRAN ) 8 MG tablet Take 1 tablet (8 mg total) by mouth every 8 (eight) hours as needed for nausea or vomiting. Start on the third day after chemotherapy. 30 tablet 1   pantoprazole  (PROTONIX ) 40 MG tablet TAKE 1 TABLET BY MOUTH 2 TIMES A DAY 60 tablet 2   prochlorperazine  (COMPAZINE ) 10 MG tablet Take 1 tablet (10 mg total) by mouth every 6 (six) hours as needed for nausea or vomiting. 30 tablet 1   sertraline  (ZOLOFT ) 50 MG tablet Take 50 mg by mouth in the morning.     tacrolimus (PROTOPIC) 0.03 % ointment Apply 1 Application topically 2 (two) times daily as needed (skin irritation.).     TIROSINT  88 MCG CAPS Take 88 mcg by mouth See admin instructions. Take 1 capsule (88 mcg) by mouth 2 days in a row, alternating with Tirosint  (100 mcg) on the 3 rd day.     triamcinolone ointment (KENALOG) 0.1 % Apply 1 Application topically 2 (two) times daily as needed (skin irritation.).  valACYclovir (VALTREX) 500 MG tablet Take 500 mg by mouth 2 (two) times daily as needed (cold sore/fever blisters).     loperamide (IMODIUM) 2 MG capsule Take by mouth as needed for diarrhea or loose stools.     mirtazapine  (REMERON ) 7.5 MG tablet TAKE 1 TABLET BY MOUTH AT BEDTIME 30 tablet 0   TIROSINT  100 MCG CAPS Take 100 mcg by mouth See admin instructions. Take 1 capsule (100 mcg) by mouth every 3 days, alternating with (Tirosint  88 mcg)--take on an empty stomach.     No current facility-administered medications for this visit.    PHYSICAL EXAMINATION: ECOG PERFORMANCE STATUS: 1 - Symptomatic but completely ambulatory  Vitals:   05/12/24 1527  BP: 98/67   Pulse: 78  Resp: 17  Temp: 98 F (36.7 C)  SpO2: 99%   Wt Readings from Last 3 Encounters:  05/12/24 138 lb 12.8 oz (63 kg)  05/04/24 136 lb 4 oz (61.8 kg)  04/20/24 134 lb 14.4 oz (61.2 kg)     GENERAL:alert, no distress and comfortable SKIN: skin color, texture, turgor are normal, no rashes or significant lesions EYES: normal, Conjunctiva are pink and non-injected, sclera clear NECK: supple, thyroid  normal size, non-tender, without nodularity Musculoskeletal:no cyanosis of digits and no clubbing  NEURO: alert & oriented x 3 with fluent speech, no focal motor/sensory deficits  Physical Exam    LABORATORY DATA:  I have reviewed the data as listed    Latest Ref Rng & Units 05/12/2024    2:53 PM 05/04/2024   12:14 PM 04/20/2024   10:29 AM  CBC  WBC 4.0 - 10.5 K/uL 5.0  4.2  5.0   Hemoglobin 12.0 - 15.0 g/dL 88.2  88.2  88.5   Hematocrit 36.0 - 46.0 % 35.5  35.3  34.7   Platelets 150 - 400 K/uL 181  178  186         Latest Ref Rng & Units 05/12/2024    2:53 PM 04/20/2024   10:29 AM 03/23/2024   10:36 AM  CMP  Glucose 70 - 99 mg/dL 92  97  867   BUN 8 - 23 mg/dL 15  9  8    Creatinine 0.44 - 1.00 mg/dL 9.43  9.47  9.38   Sodium 135 - 145 mmol/L 139  140  140   Potassium 3.5 - 5.1 mmol/L 4.0  3.9  3.8   Chloride 98 - 111 mmol/L 101  102  102   CO2 22 - 32 mmol/L 28  28  27    Calcium  8.9 - 10.3 mg/dL 9.4  9.6  9.6   Total Protein 6.5 - 8.1 g/dL 7.2  7.4  7.5   Total Bilirubin 0.0 - 1.2 mg/dL 0.4  0.7  0.6   Alkaline Phos 38 - 126 U/L 216  474  552   AST 15 - 41 U/L 30  51  62   ALT 0 - 44 U/L 24  190  171       RADIOGRAPHIC STUDIES: I have personally reviewed the radiological images as listed and agreed with the findings in the report. No results found.    No orders of the defined types were placed in this encounter.  All questions were answered. The patient knows to call the clinic with any problems, questions or concerns. No barriers to learning was  detected. The total time spent in the appointment was 25 minutes, including review of chart and various tests results, discussions about plan of care and  coordination of care plan     Onita Mattock, MD 05/12/2024     "

## 2024-05-12 NOTE — Assessment & Plan Note (Signed)
 IDC and DCIS, Stage IA, pT1b, cN0, triple negative, Grade 3, chest wall recurrence in 10/2023  -found on screening mammogram. S/p lumpectomy on 01/08/22 with Dr. Aron, path showed: 8 mm invasive and in situ ductal carcinoma with negative margins. Repeat prognostic panel confirmed triple negative disease.  -lymph node biopsies 01/28/22 showed negative nodes (0/2). Port placed at time of procedure. Postoperative course complicated by pneumothorax, which has resolved. -she began adjuvant TC on 02/20/22, and completed planned 4 cycles on 04/24/2022. -she completed adjuvant RT on 07/09/2022  -biopsy confirmed chest lymph nodes (axilla and chest wall including posteriad pectoralis muscle)  recurrence in 10/2023 - Case reviewed in breast tumor board, plan to start neoadjuvant chemotherapy with carboplatin  and paclitaxel  every 3 weeks for 4 cycles, followed by AC every 3 weeks for 4 cycles, along with immunotherapy Keytruda  for 1 year.  If she has good response to treatment, Dr. Aron will consider surgery although she is not able to remove the aubpectoral lymph node.  She may need adjuvant radiation after surgery. -she initially tolerated the chemotherapy mother well, but developed a cumulative side effects, and chemo stopped after 1 cycle of AC. -PET 03/11/2024 showed mixed response. Chemo changed to oral Xeloda  around 04/08/2024

## 2024-05-13 ENCOUNTER — Inpatient Hospital Stay: Admitting: Dietician

## 2024-05-16 ENCOUNTER — Ambulatory Visit: Admitting: Cardiology

## 2024-05-17 ENCOUNTER — Encounter: Payer: Self-pay | Admitting: Hematology

## 2024-05-18 ENCOUNTER — Telehealth: Payer: Self-pay | Admitting: Dietician

## 2024-05-18 ENCOUNTER — Inpatient Hospital Stay: Attending: Hematology | Admitting: Dietician

## 2024-05-18 NOTE — Telephone Encounter (Signed)
 Nutrition Follow-up:  Pt with recurrence of breast cancer in left chest wall, triple negative. Previously receiving chemoimmunotherapy with carbo/abraxane  + keytruda  q21d (stopped d/t toxicity and suboptimal response). Currently receiving xeloda  (start 12/29). Patient is under the care of Dr. Lanny.   Spoke with patient via telephone. Reports feeling great. States if it weren't for medications, she would never know she had cancer. Appetite has been pretty good. Reports having 2 grilled cheese for lunch. Usually is satisfied with one. Patient eating 3 meals and might snack on cubed cheese or cheezits. Reports taste is still a little off. She is able to taste sweets. This worries her a bit because she has a big sweet tooth. Patient does not want to fall back into old habits. She is asking if mini milky way is okay occasionally. Celestino has cleared, however has a spot on tongue that is mildly sore and burns with intake. Patient denies nausea, vomiting, diarrhea, constipation.   Medications: reviewed   Labs: reviewed   Anthropometrics: Wt 138 lb 12.8 oz on 1/29 - improved   1/7 - 134 lb 14.4 oz 12/10 - 137 lb 8 oz  NUTRITION DIAGNOSIS: Unintended wt loss improved    INTERVENTION:  Continue strategies for increasing calories and protein Offered healthier options to address sweet tooth (fruit with peanut butter/nutella) Discussed occasional candy bar is okay Suggested resuming baking soda salt water rinses for sore tongue -pt will message provider if soreness does not subside or worsens     MONITORING, EVALUATION, GOAL: wt trends, intake   NEXT VISIT: No follow-up scheduled. Patient has contact information and will call with nutrition questions/concerns

## 2024-05-18 NOTE — Progress Notes (Unsigned)
 "    Samantha Neth, MD Reason for referral-coronary calcification  HPI: 64 year old female for evaluation of coronary calcification at request of [name], MD.  Calcium  score at [facility] February 2025 49 which was 75th percentile.  Echocardiogram July 2025 showed normal LV function.  Cardiology asked to evaluate.  Current Outpatient Medications  Medication Sig Dispense Refill   acetaminophen  (TYLENOL ) 325 MG tablet Take 650 mg by mouth every 6 (six) hours as needed.     ALPRAZolam  (XANAX ) 0.5 MG tablet Take 1 tablet (0.5 mg total) by mouth daily as needed for anxiety. 30 tablet 1   atorvastatin (LIPITOR) 20 MG tablet Take 20 mg by mouth every evening.     betamethasone dipropionate 0.05 % cream Apply 1 Application topically 2 (two) times daily as needed (skin irritation.).     Calcium  Carb-Cholecalciferol (CALCIUM -VITAMIN D3) 250-125 MG-UNIT TABS Take 1 tablet by mouth daily with lunch.     capecitabine  (XELODA ) 500 MG tablet TAKE 3 TABLETS BY MOUTH 2 TIMES A DAY AFTER A MEAL. TAKE WITHIN 30 MINUTES AFTER MEAL, EVERY 10-12 HOURS. TAKE FOR 7 DAYS ON, THEN 7 DAYS OFESGF REPEAT EVERY 14 DAYS. START 04/11/2024 84 tablet 1   Cholecalciferol (VITAMIN D3) 50 MCG (2000 UT) TABS Take 2,000 Units by mouth daily with lunch.     Clobetasol  Prop Emollient Base 0.05 % emollient cream Apply 1 Application topically 2 (two) times daily. 60 g 1   ezetimibe (ZETIA) 10 MG tablet Take 10 mg by mouth every evening.     famotidine  (PEPCID ) 20 MG tablet Take 1 tablet (20 mg total) by mouth daily. (Patient taking differently: Take 20 mg by mouth daily as needed for heartburn or indigestion.)     fluconazole  (DIFLUCAN ) 100 MG tablet Take 2 tablets (200 mg total) by mouth daily. 28 tablet 0   lidocaine -prilocaine  (EMLA ) cream Apply to affected area once 30 g 3   loperamide (IMODIUM) 2 MG capsule Take by mouth as needed for diarrhea or loose stools.     magic mouthwash (nystatin , diphenhydrAMINE , alum & mag  hydroxide) suspension mixture Swish and spit 5 mLs 4 (four) times daily as needed for mouth pain. 140 mL 1   mirtazapine  (REMERON ) 7.5 MG tablet TAKE 1 TABLET BY MOUTH AT BEDTIME 30 tablet 0   nystatin  (MYCOSTATIN ) 100000 UNIT/ML suspension Take 5 mLs (500,000 Units total) by mouth 4 (four) times daily. 473 mL 0   nystatin -triamcinolone (MYCOLOG II) cream Apply 1 Application topically 2 (two) times daily as needed (irritation).     ondansetron  (ZOFRAN ) 8 MG tablet Take 1 tablet (8 mg total) by mouth every 8 (eight) hours as needed for nausea or vomiting. Start on the third day after chemotherapy. 30 tablet 1   pantoprazole  (PROTONIX ) 40 MG tablet TAKE 1 TABLET BY MOUTH 2 TIMES A DAY 60 tablet 2   prochlorperazine  (COMPAZINE ) 10 MG tablet Take 1 tablet (10 mg total) by mouth every 6 (six) hours as needed for nausea or vomiting. 30 tablet 1   sertraline  (ZOLOFT ) 50 MG tablet Take 50 mg by mouth in the morning.     tacrolimus (PROTOPIC) 0.03 % ointment Apply 1 Application topically 2 (two) times daily as needed (skin irritation.).     TIROSINT  100 MCG CAPS Take 100 mcg by mouth See admin instructions. Take 1 capsule (100 mcg) by mouth every 3 days, alternating with (Tirosint  88 mcg)--take on an empty stomach.     TIROSINT  88 MCG CAPS Take 88 mcg by mouth  See admin instructions. Take 1 capsule (88 mcg) by mouth 2 days in a row, alternating with Tirosint  (100 mcg) on the 3 rd day.     triamcinolone ointment (KENALOG) 0.1 % Apply 1 Application topically 2 (two) times daily as needed (skin irritation.).     valACYclovir (VALTREX) 500 MG tablet Take 500 mg by mouth 2 (two) times daily as needed (cold sore/fever blisters).     No current facility-administered medications for this visit.    Allergies[1]   Past Medical History:  Diagnosis Date   Allergy    Anxiety 2015   Result of culmination of super stressful caregiving and deaths of 2 immediate family members.  Overall do pretty well.   Cancer [facility])  2023   right breast   Coronary artery calcification    Depression    denies   GERD (gastroesophageal reflux disease)    History of left breast cancer 2024   Hyperlipidemia    Hypothyroidism    Right carpal tunnel syndrome 09/19/2019   Thyroid  disease     Past Surgical History:  Procedure Laterality Date   BREAST BIOPSY Left 12/04/2022   MM LT BREAST BX W LOC DEV 1ST LESION IMAGE BX SPEC STEREO GUIDE 12/04/2022 [facility]   BREAST BIOPSY  12/30/2022   MM LT RADIOACTIVE SEED LOC MAMMO GUIDE 12/30/2022 [facility]   BREAST LUMPECTOMY WITH RADIOACTIVE SEED LOCALIZATION Right 01/08/2022   Procedure: RIGHT BREAST LUMPECTOMY WITH RADIOACTIVE SEED LOCALIZATION;  Surgeon: [name], MD;  Location: [facility];  Service: General;  Laterality: Right;   BREAST LUMPECTOMY WITH RADIOACTIVE SEED LOCALIZATION Left 01/01/2023   Procedure: LEFT BREAST SEED LOCALIZED LUMPECTOMY;  Surgeon: [name], MD;  Location: [facility] OR;  Service: General;  Laterality: Left;   PORT-A-CATH REMOVAL Left 05/20/2022   Procedure: REMOVAL PORT-A-CATH;  Surgeon: [name], MD;  Location: [facility];  Service: General;  Laterality: Left;   PORTACATH PLACEMENT Left 01/28/2022   Procedure: INSERTION PORT-A-CATH;  Surgeon: [name], MD;  Location: [facility];  Service: General;  Laterality: Left;   PORTACATH PLACEMENT Left 11/04/2023   Procedure: INSERTION, TUNNELED CENTRAL VENOUS DEVICE, WITH PORT;  Surgeon: [name], MD;  Location: [facility];  Service: General;  Laterality: Left;  PORT PLACEMENT WITH ULTRASOUND GUIDANCE   SENTINEL NODE BIOPSY Right 01/28/2022   Procedure: RIGHT SENTINEL LYMPH NODE BIOPSY;  Surgeon: [name], MD;  Location: [facility];  Service: General;  Laterality: Right;   TONSILLECTOMY      Social History   Socioeconomic History   Marital status: Married    Spouse name: Not on file   Number of children: 1   Years  of education: Not on file   Highest education level: Not on file  Occupational History   Not on file  Tobacco Use   Smoking status: Never   Smokeless tobacco: Never  Vaping Use   Vaping status: Never Used  Substance and Sexual Activity   Alcohol use: Yes    Comment: maybe 1 drink 4 times a year   Drug use: No   Sexual activity: Not Currently    Birth control/protection: Post-menopausal  Other Topics Concern   Not on file  Social History Narrative   Not on file   Social Drivers of Health   Tobacco Use: Low Risk (04/20/2024)   Patient History    Smoking Tobacco Use: Never    Smokeless Tobacco Use: Never    Passive Exposure: Not  on file  Financial Resource Strain: Not on file  Food Insecurity: No Food Insecurity (01/28/2023)   Hunger Vital Sign    Worried About Running Out of Food in the Last Year: Never true    Ran Out of Food in the Last Year: Never true  Transportation Needs: No Transportation Needs (01/28/2023)   PRAPARE - Administrator, Civil Service (Medical): No    Lack of Transportation (Non-Medical): No  Physical Activity: Not on file  Stress: Not on file  Social Connections: Not on file  Intimate Partner Violence: Not At Risk (01/28/2023)   Humiliation, Afraid, Rape, and Kick questionnaire    Fear of Current or Ex-Partner: No    Emotionally Abused: No    Physically Abused: No    Sexually Abused: No  Depression (PHQ2-9): Low Risk (05/12/2024)   Depression (PHQ2-9)    PHQ-2 Score: 0  Alcohol Screen: Not on file  Housing: Unknown (11/02/2023)   Received from [facility]   Epic    Unable to Pay for Housing in the Last Year: Not on file    Number of Times Moved in the Last Year: Not on file    At any time in the past 12 months, were you homeless or living in a shelter (including now)?: No  Utilities: Not At Risk (01/28/2023)   [facility] Utilities    Threatened with loss of utilities: No  Health Literacy: Not on file    Family History   Problem Relation Age of Onset   Hyperlipidemia Father    Diabetes Maternal Grandmother     ROS: no fevers or chills, productive cough, hemoptysis, dysphasia, odynophagia, melena, hematochezia, dysuria, hematuria, rash, seizure activity, orthopnea, PND, pedal edema, claudication. Remaining systems are negative.  Physical Exam:   There were no vitals taken for this visit.  General:  Well developed/well nourished in NAD Skin warm/dry Patient not depressed No peripheral clubbing Back-normal HEENT-normal/normal eyelids Neck supple/normal carotid upstroke bilaterally; no bruits; no JVD; no thyromegaly chest - CTA/ normal expansion CV - RRR/normal S1 and S2; no murmurs, rubs or gallops;  PMI nondisplaced Abdomen -NT/ND, no HSM, no mass, + bowel sounds, no bruit 2+ femoral pulses, no bruits Ext-no edema, chords, 2+ DP Neuro-grossly nonfocal  ECG - personally reviewed  A/P  1 coronary calcification-  2 hyperlipidemia-  Redell Shallow, MD     [1]  Allergies Allergen Reactions   Nortriptyline  Other (See Comments)    depression   Singulair  [Montelukast  Sodium] Other (See Comments)    Depressed mood   Sulfonamide Derivatives Nausea And Vomiting   Amitriptyline  Hcl     Depression mood   Esomeprazole Hives   Fluticasone      Other Reaction(s): may have caused hearing loss   Gabapentin      Hair Loss   Lexapro [Escitalopram]     Depression mood   Neomycin     Other Reaction(s): topical irritates the skin badly.   Paclitaxel     Pseudoephedrine     Other Reaction(s): HEART RACES   Neosporin [Bacitracin-Polymyxin B] Rash   "

## 2024-05-19 ENCOUNTER — Ambulatory Visit: Admitting: Cardiology

## 2024-06-07 ENCOUNTER — Inpatient Hospital Stay: Admitting: Hematology

## 2024-06-07 ENCOUNTER — Inpatient Hospital Stay

## 2024-08-09 ENCOUNTER — Ambulatory Visit: Admitting: Cardiology
